# Patient Record
Sex: Female | Born: 1950
Health system: Southern US, Community
[De-identification: ages and names within clinical notes are randomized; demographics above are authoritative.]

## PROBLEM LIST (undated history)

## (undated) DIAGNOSIS — J302 Other seasonal allergic rhinitis: Secondary | ICD-10-CM

## (undated) DIAGNOSIS — R945 Abnormal results of liver function studies: Secondary | ICD-10-CM

## (undated) DIAGNOSIS — H269 Unspecified cataract: Secondary | ICD-10-CM

## (undated) DIAGNOSIS — Z0000 Encounter for general adult medical examination without abnormal findings: Secondary | ICD-10-CM

## (undated) DIAGNOSIS — I219 Acute myocardial infarction, unspecified: Secondary | ICD-10-CM

## (undated) DIAGNOSIS — K449 Diaphragmatic hernia without obstruction or gangrene: Secondary | ICD-10-CM

## (undated) DIAGNOSIS — M722 Plantar fascial fibromatosis: Secondary | ICD-10-CM

## (undated) DIAGNOSIS — N809 Endometriosis, unspecified: Secondary | ICD-10-CM

## (undated) DIAGNOSIS — M1990 Unspecified osteoarthritis, unspecified site: Secondary | ICD-10-CM

## (undated) DIAGNOSIS — Z923 Personal history of irradiation: Secondary | ICD-10-CM

## (undated) DIAGNOSIS — D126 Benign neoplasm of colon, unspecified: Secondary | ICD-10-CM

## (undated) DIAGNOSIS — G4730 Sleep apnea, unspecified: Secondary | ICD-10-CM

## (undated) DIAGNOSIS — C801 Malignant (primary) neoplasm, unspecified: Secondary | ICD-10-CM

## (undated) DIAGNOSIS — J209 Acute bronchitis, unspecified: Secondary | ICD-10-CM

## (undated) DIAGNOSIS — K219 Gastro-esophageal reflux disease without esophagitis: Secondary | ICD-10-CM

## (undated) DIAGNOSIS — I4729 Other ventricular tachycardia: Secondary | ICD-10-CM

## (undated) DIAGNOSIS — J029 Acute pharyngitis, unspecified: Secondary | ICD-10-CM

## (undated) DIAGNOSIS — E785 Hyperlipidemia, unspecified: Secondary | ICD-10-CM

## (undated) DIAGNOSIS — I10 Essential (primary) hypertension: Secondary | ICD-10-CM

## (undated) DIAGNOSIS — K635 Polyp of colon: Secondary | ICD-10-CM

## (undated) DIAGNOSIS — I421 Obstructive hypertrophic cardiomyopathy: Secondary | ICD-10-CM

## (undated) DIAGNOSIS — K222 Esophageal obstruction: Secondary | ICD-10-CM

## (undated) DIAGNOSIS — K760 Fatty (change of) liver, not elsewhere classified: Secondary | ICD-10-CM

## (undated) DIAGNOSIS — T7840XA Allergy, unspecified, initial encounter: Secondary | ICD-10-CM

## (undated) HISTORY — DX: Polyp of colon: K63.5

## (undated) HISTORY — DX: Obstructive hypertrophic cardiomyopathy: I42.1

## (undated) HISTORY — PX: TUBAL LIGATION: SHX77

## (undated) HISTORY — DX: Acute pharyngitis, unspecified: J02.9

## (undated) HISTORY — DX: Plantar fascial fibromatosis: M72.2

## (undated) HISTORY — DX: Esophageal obstruction: K22.2

## (undated) HISTORY — DX: Other ventricular tachycardia: I47.29

## (undated) HISTORY — DX: Benign neoplasm of colon, unspecified: D12.6

## (undated) HISTORY — DX: Sleep apnea, unspecified: G47.30

## (undated) HISTORY — DX: Acute bronchitis, unspecified: J20.9

## (undated) HISTORY — DX: Acute myocardial infarction, unspecified: I21.9

## (undated) HISTORY — PX: BREAST CYST INCISION AND DRAINAGE: SHX14

## (undated) HISTORY — DX: Other seasonal allergic rhinitis: J30.2

## (undated) HISTORY — DX: Unspecified osteoarthritis, unspecified site: M19.90

## (undated) HISTORY — DX: Fatty (change of) liver, not elsewhere classified: K76.0

## (undated) HISTORY — DX: Endometriosis, unspecified: N80.9

## (undated) HISTORY — DX: Essential (primary) hypertension: I10

## (undated) HISTORY — DX: Gastro-esophageal reflux disease without esophagitis: K21.9

## (undated) HISTORY — DX: Diaphragmatic hernia without obstruction or gangrene: K44.9

## (undated) HISTORY — PX: HERNIA REPAIR: SHX51

## (undated) HISTORY — PX: APPENDECTOMY: SHX54

## (undated) HISTORY — PX: CATARACT EXTRACTION: SUR2

## (undated) HISTORY — DX: Encounter for general adult medical examination without abnormal findings: Z00.00

## (undated) HISTORY — DX: Allergy, unspecified, initial encounter: T78.40XA

## (undated) HISTORY — DX: Abnormal results of liver function studies: R94.5

## (undated) HISTORY — DX: Hyperlipidemia, unspecified: E78.5

## (undated) HISTORY — DX: Unspecified cataract: H26.9

---
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
## 1980-06-14 HISTORY — PX: ABDOMINAL HYSTERECTOMY: SHX81

## 1999-07-30 ENCOUNTER — Other Ambulatory Visit: Admission: RE | Admit: 1999-07-30 | Discharge: 1999-07-30 | Payer: Self-pay | Admitting: Obstetrics and Gynecology

## 1999-07-30 ENCOUNTER — Encounter (INDEPENDENT_AMBULATORY_CARE_PROVIDER_SITE_OTHER): Payer: Self-pay

## 2000-07-18 ENCOUNTER — Encounter: Admission: RE | Admit: 2000-07-18 | Discharge: 2000-10-16 | Payer: Self-pay | Admitting: Internal Medicine

## 2000-08-20 ENCOUNTER — Encounter: Payer: Self-pay | Admitting: Emergency Medicine

## 2000-08-20 ENCOUNTER — Emergency Department (HOSPITAL_COMMUNITY): Admission: EM | Admit: 2000-08-20 | Discharge: 2000-08-20 | Payer: Self-pay | Admitting: Emergency Medicine

## 2001-07-05 ENCOUNTER — Other Ambulatory Visit: Admission: RE | Admit: 2001-07-05 | Discharge: 2001-07-05 | Payer: Self-pay | Admitting: Gynecology

## 2002-07-17 ENCOUNTER — Other Ambulatory Visit: Admission: RE | Admit: 2002-07-17 | Discharge: 2002-07-17 | Payer: Self-pay | Admitting: Gynecology

## 2002-08-17 LAB — HM COLONOSCOPY

## 2002-10-18 ENCOUNTER — Ambulatory Visit (HOSPITAL_BASED_OUTPATIENT_CLINIC_OR_DEPARTMENT_OTHER): Admission: RE | Admit: 2002-10-18 | Discharge: 2002-10-18 | Payer: Self-pay | Admitting: Otolaryngology

## 2003-04-14 ENCOUNTER — Emergency Department (HOSPITAL_COMMUNITY): Admission: EM | Admit: 2003-04-14 | Discharge: 2003-04-14 | Payer: Self-pay | Admitting: Emergency Medicine

## 2003-07-29 ENCOUNTER — Encounter: Payer: Self-pay | Admitting: Gastroenterology

## 2003-09-04 ENCOUNTER — Other Ambulatory Visit: Admission: RE | Admit: 2003-09-04 | Discharge: 2003-09-04 | Payer: Self-pay | Admitting: Gynecology

## 2004-02-18 ENCOUNTER — Ambulatory Visit (HOSPITAL_COMMUNITY): Admission: RE | Admit: 2004-02-18 | Discharge: 2004-02-18 | Payer: Self-pay | Admitting: Gastroenterology

## 2004-03-26 ENCOUNTER — Ambulatory Visit (HOSPITAL_COMMUNITY): Admission: RE | Admit: 2004-03-26 | Discharge: 2004-03-26 | Payer: Self-pay | Admitting: Gastroenterology

## 2004-04-06 ENCOUNTER — Ambulatory Visit (HOSPITAL_COMMUNITY): Admission: RE | Admit: 2004-04-06 | Discharge: 2004-04-06 | Payer: Self-pay | Admitting: Gastroenterology

## 2004-04-11 ENCOUNTER — Ambulatory Visit (HOSPITAL_COMMUNITY): Admission: RE | Admit: 2004-04-11 | Discharge: 2004-04-11 | Payer: Self-pay | Admitting: Gastroenterology

## 2004-04-15 ENCOUNTER — Ambulatory Visit (HOSPITAL_COMMUNITY): Admission: RE | Admit: 2004-04-15 | Discharge: 2004-04-15 | Payer: Self-pay | Admitting: Gastroenterology

## 2004-04-23 ENCOUNTER — Ambulatory Visit: Payer: Self-pay | Admitting: Internal Medicine

## 2004-04-30 ENCOUNTER — Ambulatory Visit: Payer: Self-pay | Admitting: Gastroenterology

## 2004-05-04 ENCOUNTER — Ambulatory Visit: Payer: Self-pay | Admitting: Gastroenterology

## 2004-05-04 ENCOUNTER — Ambulatory Visit (HOSPITAL_COMMUNITY): Admission: RE | Admit: 2004-05-04 | Discharge: 2004-05-04 | Payer: Self-pay | Admitting: Gastroenterology

## 2004-05-04 DIAGNOSIS — K449 Diaphragmatic hernia without obstruction or gangrene: Secondary | ICD-10-CM | POA: Insufficient documentation

## 2004-05-04 DIAGNOSIS — K222 Esophageal obstruction: Secondary | ICD-10-CM | POA: Insufficient documentation

## 2004-06-14 HISTORY — PX: OTHER SURGICAL HISTORY: SHX169

## 2004-06-29 ENCOUNTER — Ambulatory Visit: Payer: Self-pay | Admitting: Internal Medicine

## 2004-08-27 ENCOUNTER — Ambulatory Visit: Payer: Self-pay | Admitting: Internal Medicine

## 2004-09-16 ENCOUNTER — Encounter: Admission: RE | Admit: 2004-09-16 | Discharge: 2004-09-16 | Payer: Self-pay | Admitting: Thoracic Surgery

## 2004-10-09 ENCOUNTER — Encounter (INDEPENDENT_AMBULATORY_CARE_PROVIDER_SITE_OTHER): Payer: Self-pay | Admitting: Specialist

## 2004-10-09 ENCOUNTER — Inpatient Hospital Stay (HOSPITAL_COMMUNITY): Admission: RE | Admit: 2004-10-09 | Discharge: 2004-10-13 | Payer: Self-pay | Admitting: Thoracic Surgery

## 2004-10-29 ENCOUNTER — Ambulatory Visit: Payer: Self-pay | Admitting: Endocrinology

## 2004-10-29 ENCOUNTER — Inpatient Hospital Stay (HOSPITAL_COMMUNITY): Admission: EM | Admit: 2004-10-29 | Discharge: 2004-11-01 | Payer: Self-pay | Admitting: Emergency Medicine

## 2004-11-03 ENCOUNTER — Ambulatory Visit: Payer: Self-pay | Admitting: Internal Medicine

## 2004-11-04 ENCOUNTER — Encounter: Admission: RE | Admit: 2004-11-04 | Discharge: 2004-11-04 | Payer: Self-pay | Admitting: Thoracic Surgery

## 2004-11-26 ENCOUNTER — Ambulatory Visit: Payer: Self-pay | Admitting: Internal Medicine

## 2004-11-30 ENCOUNTER — Encounter: Admission: RE | Admit: 2004-11-30 | Discharge: 2004-11-30 | Payer: Self-pay | Admitting: Thoracic Surgery

## 2005-01-01 ENCOUNTER — Ambulatory Visit: Payer: Self-pay | Admitting: Internal Medicine

## 2005-01-14 ENCOUNTER — Ambulatory Visit: Payer: Self-pay | Admitting: Internal Medicine

## 2005-02-03 ENCOUNTER — Encounter: Admission: RE | Admit: 2005-02-03 | Discharge: 2005-02-03 | Payer: Self-pay | Admitting: Thoracic Surgery

## 2005-02-17 ENCOUNTER — Other Ambulatory Visit: Admission: RE | Admit: 2005-02-17 | Discharge: 2005-02-17 | Payer: Self-pay | Admitting: Gynecology

## 2005-03-17 ENCOUNTER — Ambulatory Visit: Payer: Self-pay | Admitting: Internal Medicine

## 2005-06-15 ENCOUNTER — Ambulatory Visit: Payer: Self-pay | Admitting: Internal Medicine

## 2005-06-22 ENCOUNTER — Ambulatory Visit: Payer: Self-pay | Admitting: Internal Medicine

## 2005-07-01 ENCOUNTER — Encounter: Payer: Self-pay | Admitting: Cardiology

## 2005-07-01 ENCOUNTER — Ambulatory Visit: Payer: Self-pay

## 2005-07-05 ENCOUNTER — Ambulatory Visit: Payer: Self-pay | Admitting: Internal Medicine

## 2005-07-08 ENCOUNTER — Ambulatory Visit: Payer: Self-pay | Admitting: Internal Medicine

## 2005-08-03 ENCOUNTER — Ambulatory Visit: Payer: Self-pay | Admitting: Internal Medicine

## 2005-09-07 ENCOUNTER — Ambulatory Visit: Payer: Self-pay | Admitting: Internal Medicine

## 2005-11-03 ENCOUNTER — Ambulatory Visit: Payer: Self-pay | Admitting: Internal Medicine

## 2005-11-17 ENCOUNTER — Ambulatory Visit: Payer: Self-pay | Admitting: Internal Medicine

## 2005-12-17 ENCOUNTER — Ambulatory Visit: Payer: Self-pay | Admitting: Internal Medicine

## 2005-12-29 ENCOUNTER — Ambulatory Visit: Payer: Self-pay | Admitting: Internal Medicine

## 2006-03-28 ENCOUNTER — Ambulatory Visit: Payer: Self-pay | Admitting: Internal Medicine

## 2006-03-28 LAB — CONVERTED CEMR LAB
AST: 22 units/L (ref 0–37)
BUN: 11 mg/dL (ref 6–23)
Chloride: 108 meq/L (ref 96–112)
Creatinine, Ser: 1 mg/dL (ref 0.4–1.2)
GFR calc non Af Amer: 62 mL/min
Potassium: 4.2 meq/L (ref 3.5–5.1)
Total Bilirubin: 0.5 mg/dL (ref 0.3–1.2)

## 2006-04-04 ENCOUNTER — Ambulatory Visit: Payer: Self-pay | Admitting: Internal Medicine

## 2006-07-04 ENCOUNTER — Ambulatory Visit: Payer: Self-pay | Admitting: Internal Medicine

## 2006-07-04 LAB — CONVERTED CEMR LAB
BUN: 7 mg/dL (ref 6–23)
Basophils Relative: 0.5 % (ref 0.0–1.0)
Calcium: 9.3 mg/dL (ref 8.4–10.5)
Cholesterol: 109 mg/dL (ref 0–200)
Creatinine, Ser: 1 mg/dL (ref 0.4–1.2)
GFR calc Af Amer: 75 mL/min
GFR calc non Af Amer: 62 mL/min
HDL: 35.3 mg/dL — ABNORMAL LOW (ref >39.0)
Hemoglobin: 12.9 g/dL (ref 12.0–15.0)
Hgb A1c MFr Bld: 7.3 % — ABNORMAL HIGH (ref 4.6–6.0)
LDL Cholesterol: 58 mg/dL (ref 0–99)
MCHC: 35.3 g/dL (ref 30.0–36.0)
Monocytes Absolute: 0.4 10*3/uL (ref 0.2–0.7)
Monocytes Relative: 7.9 % (ref 3.0–11.0)
Neutro Abs: 2.5 10*3/uL (ref 1.4–7.7)
Neutrophils Relative %: 53 % (ref 43.0–77.0)
Platelets: 229 10*3/uL (ref 150–400)
Potassium: 3.9 meq/L (ref 3.5–5.1)
RDW: 12.5 % (ref 11.5–14.6)
Sodium: 141 meq/L (ref 135–145)
Total CHOL/HDL Ratio: 3.1
Triglycerides: 78 mg/dL (ref 0–149)
VLDL: 16 mg/dL (ref 0–40)

## 2006-07-11 ENCOUNTER — Ambulatory Visit: Payer: Self-pay | Admitting: Internal Medicine

## 2006-07-22 ENCOUNTER — Ambulatory Visit: Payer: Self-pay | Admitting: Internal Medicine

## 2006-09-12 ENCOUNTER — Ambulatory Visit: Payer: Self-pay | Admitting: Internal Medicine

## 2006-10-28 DIAGNOSIS — I10 Essential (primary) hypertension: Secondary | ICD-10-CM | POA: Insufficient documentation

## 2006-10-28 DIAGNOSIS — E782 Mixed hyperlipidemia: Secondary | ICD-10-CM | POA: Insufficient documentation

## 2006-10-28 DIAGNOSIS — F418 Other specified anxiety disorders: Secondary | ICD-10-CM | POA: Insufficient documentation

## 2006-10-28 DIAGNOSIS — E1165 Type 2 diabetes mellitus with hyperglycemia: Secondary | ICD-10-CM | POA: Insufficient documentation

## 2006-11-01 ENCOUNTER — Ambulatory Visit: Payer: Self-pay | Admitting: Internal Medicine

## 2006-11-08 ENCOUNTER — Ambulatory Visit: Payer: Self-pay | Admitting: Internal Medicine

## 2006-11-16 ENCOUNTER — Ambulatory Visit: Payer: Self-pay | Admitting: Internal Medicine

## 2006-11-30 ENCOUNTER — Ambulatory Visit: Payer: Self-pay | Admitting: Internal Medicine

## 2006-12-22 ENCOUNTER — Ambulatory Visit: Payer: Self-pay | Admitting: Internal Medicine

## 2007-03-22 ENCOUNTER — Ambulatory Visit: Payer: Self-pay | Admitting: Internal Medicine

## 2007-03-22 DIAGNOSIS — I716 Thoracoabdominal aortic aneurysm, without rupture: Secondary | ICD-10-CM

## 2007-03-22 LAB — CONVERTED CEMR LAB
BUN: 8 mg/dL (ref 6–23)
Chloride: 107 meq/L (ref 96–112)
Cholesterol: 140 mg/dL (ref 0–200)
GFR calc non Af Amer: 79 mL/min
Glucose, Bld: 204 mg/dL — ABNORMAL HIGH (ref 70–99)
Hgb A1c MFr Bld: 9.3 % — ABNORMAL HIGH (ref 4.6–6.0)
Potassium: 4.3 meq/L (ref 3.5–5.1)
Sodium: 143 meq/L (ref 135–145)
Total CHOL/HDL Ratio: 4.1
Triglycerides: 94 mg/dL (ref 0–149)

## 2007-03-24 ENCOUNTER — Ambulatory Visit: Payer: Self-pay | Admitting: Cardiology

## 2007-04-04 ENCOUNTER — Telehealth: Payer: Self-pay | Admitting: Internal Medicine

## 2007-04-13 ENCOUNTER — Ambulatory Visit: Payer: Self-pay | Admitting: Thoracic Surgery

## 2007-04-25 ENCOUNTER — Ambulatory Visit: Payer: Self-pay | Admitting: Gastroenterology

## 2007-05-06 ENCOUNTER — Ambulatory Visit: Payer: Self-pay | Admitting: Internal Medicine

## 2007-05-06 ENCOUNTER — Telehealth: Payer: Self-pay | Admitting: Internal Medicine

## 2007-05-25 ENCOUNTER — Encounter: Payer: Self-pay | Admitting: Internal Medicine

## 2007-05-25 ENCOUNTER — Ambulatory Visit: Payer: Self-pay | Admitting: Gastroenterology

## 2007-06-21 ENCOUNTER — Ambulatory Visit: Payer: Self-pay | Admitting: Internal Medicine

## 2007-06-21 DIAGNOSIS — M722 Plantar fascial fibromatosis: Secondary | ICD-10-CM | POA: Insufficient documentation

## 2007-06-21 LAB — CONVERTED CEMR LAB
ALT: 69 units/L — ABNORMAL HIGH (ref 0–35)
AST: 51 units/L — ABNORMAL HIGH (ref 0–37)
Bilirubin, Direct: 0.1 mg/dL (ref 0.0–0.3)
Calcium: 10 mg/dL (ref 8.4–10.5)
Chloride: 102 meq/L (ref 96–112)
GFR calc non Af Amer: 69 mL/min
Hgb A1c MFr Bld: 8.5 % — ABNORMAL HIGH (ref 4.6–6.0)
Sodium: 140 meq/L (ref 135–145)
Total Protein: 7 g/dL (ref 6.0–8.3)

## 2007-07-24 ENCOUNTER — Encounter: Payer: Self-pay | Admitting: Internal Medicine

## 2007-09-15 ENCOUNTER — Ambulatory Visit: Payer: Self-pay | Admitting: Internal Medicine

## 2007-09-20 ENCOUNTER — Ambulatory Visit: Payer: Self-pay | Admitting: Internal Medicine

## 2007-09-20 LAB — CONVERTED CEMR LAB
ALT: 47 units/L — ABNORMAL HIGH (ref 0–35)
AST: 32 units/L (ref 0–37)
Bilirubin, Direct: 0.1 mg/dL (ref 0.0–0.3)
Hgb A1c MFr Bld: 7.3 % — ABNORMAL HIGH (ref 4.6–6.0)
Total Protein: 6.9 g/dL (ref 6.0–8.3)

## 2007-09-25 ENCOUNTER — Telehealth: Payer: Self-pay | Admitting: Internal Medicine

## 2007-09-26 ENCOUNTER — Ambulatory Visit: Payer: Self-pay | Admitting: Internal Medicine

## 2007-09-26 LAB — CONVERTED CEMR LAB
ALT: 46 units/L — ABNORMAL HIGH (ref 0–35)
Basophils Relative: 0.5 % (ref 0.0–1.0)
EBV VCA IgG: 5.22 — ABNORMAL HIGH
EBV VCA IgM: 0.37
Eosinophils Relative: 1.2 % (ref 0.0–5.0)
Hemoglobin: 13.9 g/dL (ref 12.0–15.0)
Lymphocytes Relative: 35.8 % (ref 12.0–46.0)
MCHC: 33.2 g/dL (ref 30.0–36.0)
Monocytes Absolute: 0.5 10*3/uL (ref 0.1–1.0)
Neutro Abs: 4.2 10*3/uL (ref 1.4–7.7)
Neutrophils Relative %: 55.9 % (ref 43.0–77.0)
RBC: 5.07 M/uL (ref 3.87–5.11)
RDW: 13.1 % (ref 11.5–14.6)
Total Bilirubin: 0.4 mg/dL (ref 0.3–1.2)
Total Protein: 7.1 g/dL (ref 6.0–8.3)

## 2007-10-10 ENCOUNTER — Ambulatory Visit: Payer: Self-pay | Admitting: Internal Medicine

## 2007-10-10 LAB — CONVERTED CEMR LAB
ALT: 47 units/L — ABNORMAL HIGH (ref 0–35)
AST: 29 units/L (ref 0–37)
Alkaline Phosphatase: 66 units/L (ref 39–117)
Total Bilirubin: 0.5 mg/dL (ref 0.3–1.2)

## 2007-10-17 ENCOUNTER — Telehealth: Payer: Self-pay | Admitting: *Deleted

## 2008-01-03 ENCOUNTER — Ambulatory Visit: Payer: Self-pay | Admitting: Internal Medicine

## 2008-01-03 DIAGNOSIS — B351 Tinea unguium: Secondary | ICD-10-CM | POA: Insufficient documentation

## 2008-01-03 LAB — CONVERTED CEMR LAB
ALT: 37 units/L — ABNORMAL HIGH (ref 0–35)
AST: 29 units/L (ref 0–37)
Bilirubin, Direct: 0.1 mg/dL (ref 0.0–0.3)
Total Protein: 7.3 g/dL (ref 6.0–8.3)

## 2008-01-09 ENCOUNTER — Telehealth: Payer: Self-pay | Admitting: Internal Medicine

## 2008-03-15 ENCOUNTER — Telehealth: Payer: Self-pay | Admitting: Internal Medicine

## 2008-03-19 ENCOUNTER — Ambulatory Visit: Payer: Self-pay | Admitting: Internal Medicine

## 2008-03-27 ENCOUNTER — Ambulatory Visit: Payer: Self-pay | Admitting: Thoracic Surgery

## 2008-03-27 ENCOUNTER — Telehealth: Payer: Self-pay | Admitting: Internal Medicine

## 2008-03-27 ENCOUNTER — Encounter: Admission: RE | Admit: 2008-03-27 | Discharge: 2008-03-27 | Payer: Self-pay | Admitting: Thoracic Surgery

## 2008-04-04 ENCOUNTER — Ambulatory Visit: Payer: Self-pay | Admitting: Internal Medicine

## 2008-04-04 LAB — CONVERTED CEMR LAB
ALT: 37 units/L — ABNORMAL HIGH (ref 0–35)
Albumin: 3.6 g/dL (ref 3.5–5.2)
Alkaline Phosphatase: 80 units/L (ref 39–117)
BUN: 15 mg/dL (ref 6–23)
Calcium: 9.6 mg/dL (ref 8.4–10.5)
Chloride: 104 meq/L (ref 96–112)
Creatinine, Ser: 0.9 mg/dL (ref 0.4–1.2)
GFR calc non Af Amer: 69 mL/min
Glucose, Bld: 118 mg/dL — ABNORMAL HIGH (ref 70–99)

## 2008-05-13 ENCOUNTER — Other Ambulatory Visit: Admission: RE | Admit: 2008-05-13 | Discharge: 2008-05-13 | Payer: Self-pay | Admitting: Gynecology

## 2008-05-21 ENCOUNTER — Telehealth: Payer: Self-pay | Admitting: Internal Medicine

## 2008-06-28 ENCOUNTER — Ambulatory Visit: Payer: Self-pay | Admitting: Internal Medicine

## 2008-06-28 LAB — CONVERTED CEMR LAB
Albumin: 3.8 g/dL (ref 3.5–5.2)
BUN: 15 mg/dL (ref 6–23)
Creatinine, Ser: 0.8 mg/dL (ref 0.4–1.2)
GFR calc Af Amer: 96 mL/min
GFR calc non Af Amer: 79 mL/min
Glucose, Bld: 106 mg/dL — ABNORMAL HIGH (ref 70–99)
HDL: 35.5 mg/dL — ABNORMAL LOW (ref >39.0)
LDL Cholesterol: 72 mg/dL (ref 0–99)
Potassium: 4 meq/L (ref 3.5–5.1)
Total CHOL/HDL Ratio: 3.8
Total Protein: 7.1 g/dL (ref 6.0–8.3)
VLDL: 27 mg/dL (ref 0–40)

## 2008-07-05 ENCOUNTER — Ambulatory Visit: Payer: Self-pay | Admitting: Internal Medicine

## 2008-07-05 DIAGNOSIS — M1990 Unspecified osteoarthritis, unspecified site: Secondary | ICD-10-CM | POA: Insufficient documentation

## 2008-07-05 DIAGNOSIS — R109 Unspecified abdominal pain: Secondary | ICD-10-CM | POA: Insufficient documentation

## 2008-07-24 ENCOUNTER — Encounter: Payer: Self-pay | Admitting: Internal Medicine

## 2008-08-12 LAB — CONVERTED CEMR LAB: Pap Smear: NORMAL

## 2008-08-13 ENCOUNTER — Telehealth: Payer: Self-pay | Admitting: Internal Medicine

## 2008-09-18 ENCOUNTER — Telehealth: Payer: Self-pay | Admitting: Internal Medicine

## 2008-10-03 ENCOUNTER — Ambulatory Visit: Payer: Self-pay | Admitting: Internal Medicine

## 2008-10-03 LAB — CONVERTED CEMR LAB
Hep B S Ab: NEGATIVE
Hgb A1c MFr Bld: 6.9 % — ABNORMAL HIGH (ref 4.6–6.5)

## 2008-10-10 ENCOUNTER — Ambulatory Visit: Payer: Self-pay | Admitting: Internal Medicine

## 2008-10-10 DIAGNOSIS — M546 Pain in thoracic spine: Secondary | ICD-10-CM | POA: Insufficient documentation

## 2008-10-11 ENCOUNTER — Encounter: Admission: RE | Admit: 2008-10-11 | Discharge: 2008-10-11 | Payer: Self-pay | Admitting: Internal Medicine

## 2008-10-14 ENCOUNTER — Telehealth: Payer: Self-pay | Admitting: Internal Medicine

## 2008-10-21 ENCOUNTER — Encounter: Admission: RE | Admit: 2008-10-21 | Discharge: 2008-10-21 | Payer: Self-pay | Admitting: Internal Medicine

## 2008-10-21 ENCOUNTER — Encounter: Payer: Self-pay | Admitting: Internal Medicine

## 2008-10-22 ENCOUNTER — Encounter: Payer: Self-pay | Admitting: Internal Medicine

## 2008-12-18 ENCOUNTER — Encounter: Admission: RE | Admit: 2008-12-18 | Discharge: 2009-03-18 | Payer: Self-pay | Admitting: Internal Medicine

## 2008-12-18 ENCOUNTER — Encounter: Payer: Self-pay | Admitting: Internal Medicine

## 2008-12-20 ENCOUNTER — Telehealth: Payer: Self-pay | Admitting: *Deleted

## 2008-12-20 ENCOUNTER — Ambulatory Visit: Payer: Self-pay | Admitting: Internal Medicine

## 2008-12-20 DIAGNOSIS — I452 Bifascicular block: Secondary | ICD-10-CM | POA: Insufficient documentation

## 2008-12-31 ENCOUNTER — Ambulatory Visit: Payer: Self-pay | Admitting: Internal Medicine

## 2008-12-31 LAB — CONVERTED CEMR LAB
Alkaline Phosphatase: 75 units/L (ref 39–117)
Bilirubin, Direct: 0.1 mg/dL (ref 0.0–0.3)
Cholesterol: 150 mg/dL (ref 0–200)
Creatinine,U: 176.9 mg/dL
LDL Cholesterol: 89 mg/dL (ref 0–99)
Microalb Creat Ratio: 2.3 mg/g (ref 0.0–30.0)
Microalb, Ur: 0.4 mg/dL (ref 0.0–1.9)
Total Bilirubin: 0.7 mg/dL (ref 0.3–1.2)
Total CHOL/HDL Ratio: 4
VLDL: 20.8 mg/dL (ref 0.0–40.0)

## 2009-01-01 ENCOUNTER — Ambulatory Visit: Payer: Self-pay

## 2009-01-01 ENCOUNTER — Encounter: Payer: Self-pay | Admitting: Cardiology

## 2009-01-07 ENCOUNTER — Ambulatory Visit: Payer: Self-pay | Admitting: Internal Medicine

## 2009-01-16 ENCOUNTER — Encounter: Payer: Self-pay | Admitting: Internal Medicine

## 2009-01-20 ENCOUNTER — Telehealth (INDEPENDENT_AMBULATORY_CARE_PROVIDER_SITE_OTHER): Payer: Self-pay | Admitting: *Deleted

## 2009-01-30 ENCOUNTER — Ambulatory Visit: Payer: Self-pay | Admitting: Internal Medicine

## 2009-01-30 DIAGNOSIS — R0989 Other specified symptoms and signs involving the circulatory and respiratory systems: Secondary | ICD-10-CM

## 2009-01-30 DIAGNOSIS — R0609 Other forms of dyspnea: Secondary | ICD-10-CM | POA: Insufficient documentation

## 2009-02-04 ENCOUNTER — Ambulatory Visit: Payer: Self-pay

## 2009-02-04 ENCOUNTER — Encounter: Payer: Self-pay | Admitting: Internal Medicine

## 2009-02-19 ENCOUNTER — Encounter: Payer: Self-pay | Admitting: Internal Medicine

## 2009-02-19 ENCOUNTER — Ambulatory Visit (HOSPITAL_BASED_OUTPATIENT_CLINIC_OR_DEPARTMENT_OTHER): Admission: RE | Admit: 2009-02-19 | Discharge: 2009-02-19 | Payer: Self-pay | Admitting: Internal Medicine

## 2009-02-21 ENCOUNTER — Ambulatory Visit: Payer: Self-pay | Admitting: Internal Medicine

## 2009-03-17 ENCOUNTER — Telehealth: Payer: Self-pay | Admitting: Internal Medicine

## 2009-03-19 ENCOUNTER — Ambulatory Visit: Payer: Self-pay | Admitting: Internal Medicine

## 2009-03-19 LAB — CONVERTED CEMR LAB: Hgb A1c MFr Bld: 7.2 % — ABNORMAL HIGH (ref 4.6–6.5)

## 2009-03-26 ENCOUNTER — Ambulatory Visit: Payer: Self-pay | Admitting: Internal Medicine

## 2009-03-26 DIAGNOSIS — G4733 Obstructive sleep apnea (adult) (pediatric): Secondary | ICD-10-CM | POA: Insufficient documentation

## 2009-03-28 ENCOUNTER — Telehealth: Payer: Self-pay | Admitting: Internal Medicine

## 2009-04-07 ENCOUNTER — Encounter: Payer: Self-pay | Admitting: Internal Medicine

## 2009-04-15 ENCOUNTER — Encounter (INDEPENDENT_AMBULATORY_CARE_PROVIDER_SITE_OTHER): Payer: Self-pay | Admitting: *Deleted

## 2009-04-29 ENCOUNTER — Ambulatory Visit: Payer: Self-pay | Admitting: Internal Medicine

## 2009-04-29 LAB — CONVERTED CEMR LAB
BUN: 13 mg/dL (ref 6–23)
Bilirubin, Direct: 0 mg/dL (ref 0.0–0.3)
Chloride: 101 meq/L (ref 96–112)
GFR calc non Af Amer: 83.1 mL/min (ref >60)
Glucose, Bld: 206 mg/dL — ABNORMAL HIGH (ref 70–99)
Potassium: 4.2 meq/L (ref 3.5–5.1)
Sodium: 141 meq/L (ref 135–145)
Total Bilirubin: 0.7 mg/dL (ref 0.3–1.2)

## 2009-05-14 LAB — HM DIABETES EYE EXAM

## 2009-08-22 ENCOUNTER — Ambulatory Visit: Payer: Self-pay | Admitting: Internal Medicine

## 2009-08-22 LAB — CONVERTED CEMR LAB
ALT: 44 U/L — ABNORMAL HIGH
AST: 28 U/L
Albumin: 3.6 g/dL
Alkaline Phosphatase: 75 U/L
BUN: 11 mg/dL
Basophils Absolute: 0 K/uL
Basophils Relative: 0.1 %
Bilirubin Urine: NEGATIVE
Bilirubin, Direct: 0.1 mg/dL
Blood in Urine, dipstick: NEGATIVE
CO2: 29 meq/L
Calcium: 9.3 mg/dL
Chloride: 107 meq/L
Cholesterol: 130 mg/dL
Creatinine, Ser: 0.9 mg/dL
Creatinine,U: 182.9 mg/dL
Eosinophils Absolute: 0.1 K/uL
Eosinophils Relative: 0.9 %
GFR calc non Af Amer: 83.01 mL/min
Glucose, Bld: 139 mg/dL — ABNORMAL HIGH
HCT: 40.3 %
HDL: 45.2 mg/dL
Hemoglobin: 13.2 g/dL
Hgb A1c MFr Bld: 7.2 % — ABNORMAL HIGH
Ketones, urine, test strip: NEGATIVE
LDL Cholesterol: 67 mg/dL
Lymphocytes Relative: 30.8 %
Lymphs Abs: 2 K/uL
MCHC: 32.7 g/dL
MCV: 82.8 fL
Microalb Creat Ratio: 2.7 mg/g
Microalb, Ur: 0.5 mg/dL
Monocytes Absolute: 0.6 K/uL
Monocytes Relative: 8.9 %
Neutro Abs: 3.7 K/uL
Neutrophils Relative %: 59.3 %
Nitrite: NEGATIVE
Platelets: 230 K/uL
Potassium: 4.1 meq/L
RBC: 4.87 M/uL
RDW: 13 %
Sodium: 142 meq/L
Specific Gravity, Urine: >=1.03
TSH: 1.27 u[IU]/mL
Total Bilirubin: 0.6 mg/dL
Total CHOL/HDL Ratio: 3
Total Protein: 7.2 g/dL
Triglycerides: 88 mg/dL
Urobilinogen, UA: 0.2
VLDL: 17.6 mg/dL
WBC: 6.4 10*3/microliter

## 2009-08-25 ENCOUNTER — Encounter: Payer: Self-pay | Admitting: Internal Medicine

## 2009-08-25 LAB — HM MAMMOGRAPHY

## 2009-08-29 ENCOUNTER — Ambulatory Visit: Payer: Self-pay | Admitting: Internal Medicine

## 2009-10-21 ENCOUNTER — Encounter: Payer: Self-pay | Admitting: Internal Medicine

## 2009-10-31 ENCOUNTER — Ambulatory Visit: Payer: Self-pay | Admitting: Internal Medicine

## 2009-10-31 ENCOUNTER — Telehealth: Payer: Self-pay | Admitting: Internal Medicine

## 2009-10-31 DIAGNOSIS — M545 Low back pain, unspecified: Secondary | ICD-10-CM | POA: Insufficient documentation

## 2009-11-01 ENCOUNTER — Ambulatory Visit: Payer: Self-pay | Admitting: Family Medicine

## 2009-12-01 ENCOUNTER — Ambulatory Visit: Payer: Self-pay | Admitting: Internal Medicine

## 2009-12-08 ENCOUNTER — Ambulatory Visit: Payer: Self-pay | Admitting: Internal Medicine

## 2010-01-19 ENCOUNTER — Ambulatory Visit: Payer: Self-pay | Admitting: Internal Medicine

## 2010-01-19 LAB — CONVERTED CEMR LAB
BUN: 13 mg/dL (ref 6–23)
Calcium: 9.5 mg/dL (ref 8.4–10.5)
Chloride: 106 meq/L (ref 96–112)
Creatinine, Ser: 0.8 mg/dL (ref 0.4–1.2)
Hgb A1c MFr Bld: 8.2 % — ABNORMAL HIGH (ref 4.6–6.5)

## 2010-01-26 ENCOUNTER — Ambulatory Visit: Payer: Self-pay | Admitting: Internal Medicine

## 2010-04-21 ENCOUNTER — Ambulatory Visit: Payer: Self-pay | Admitting: Internal Medicine

## 2010-04-21 LAB — CONVERTED CEMR LAB
AST: 48 units/L — ABNORMAL HIGH (ref 0–37)
Albumin: 3.6 g/dL (ref 3.5–5.2)
Hgb A1c MFr Bld: 7.7 % — ABNORMAL HIGH (ref 4.6–6.5)

## 2010-04-28 ENCOUNTER — Ambulatory Visit: Payer: Self-pay | Admitting: Internal Medicine

## 2010-04-28 DIAGNOSIS — K760 Fatty (change of) liver, not elsewhere classified: Secondary | ICD-10-CM | POA: Insufficient documentation

## 2010-04-28 HISTORY — DX: Fatty (change of) liver, not elsewhere classified: K76.0

## 2010-05-22 ENCOUNTER — Telehealth: Payer: Self-pay | Admitting: Internal Medicine

## 2010-05-29 ENCOUNTER — Ambulatory Visit: Payer: Self-pay | Admitting: Internal Medicine

## 2010-05-29 LAB — CONVERTED CEMR LAB
ALT: 88 units/L — ABNORMAL HIGH (ref 0–35)
Alkaline Phosphatase: 80 units/L (ref 39–117)
BUN: 15 mg/dL (ref 6–23)
Bilirubin, Direct: 0.1 mg/dL (ref 0.0–0.3)
Calcium: 9.7 mg/dL (ref 8.4–10.5)
Creatinine, Ser: 0.9 mg/dL (ref 0.4–1.2)
GFR calc non Af Amer: 83.86 mL/min (ref >60.00)
Glucose, Bld: 71 mg/dL (ref 70–99)
Total Protein: 7.1 g/dL (ref 6.0–8.3)

## 2010-06-16 ENCOUNTER — Encounter: Payer: Self-pay | Admitting: Internal Medicine

## 2010-07-05 ENCOUNTER — Encounter: Payer: Self-pay | Admitting: Thoracic Surgery

## 2010-07-08 ENCOUNTER — Encounter: Payer: Self-pay | Admitting: Gastroenterology

## 2010-07-12 LAB — CONVERTED CEMR LAB
Free T4: 0.7 ng/dL (ref 0.6–1.6)
GFR calc non Af Amer: 95.32 mL/min (ref >60)
Glucose, Bld: 160 mg/dL — ABNORMAL HIGH (ref 70–99)
Potassium: 4.1 meq/L (ref 3.5–5.1)
Sodium: 142 meq/L (ref 135–145)
T3, Free: 2.7 pg/mL (ref 2.3–4.2)
TSH: 0.87 microintl units/mL (ref 0.35–5.50)
Total CK: 97 units/L (ref 7–177)

## 2010-07-14 NOTE — Assessment & Plan Note (Signed)
Summary: LBP   Vital Signs:  Patient profile:   60 year old female Height:      62.5 inches Weight:      214 pounds BMI:     38.66 O2 Sat:      98 % on Room air Pulse rate:   92 / minute BP sitting:   136 / 92  (left arm) Cuff size:   large  Vitals Entered By: Payton Spark CMA (Nov 01, 2009 9:59 AM)  O2 Flow:  Room air CC: low back pain x 3 days. Feels very tight. , Back Pain   Primary Care Provider:  Stacie Glaze MD  CC:  low back pain x 3 days. Feels very tight.  and Back Pain.  History of Present Illness:       This is a 60 year old woman who presents with Back Pain.  The symptoms began 3 days ago.  The intensity is described as a 10 out of 10.  The patient denies fever, chills, weakness, loss of sensation, urinary incontinence, and urinary retention.  The pain is located in the right low back and left low back.  The pain began gradually.  The pain is made worse by lying down, flexion, and activity.  The pain is made better by inactivity and acetaminophen.  Risk factors for serious underlying conditions include age >= 50 years.    Current Medications (verified): 1)  Janumet 50-500 Mg Tabs (Sitagliptin-Metformin Hcl) .... Take 1 Tablet By Mouth Twice A Day 2)  Lipitor 10 Mg Tabs (Atorvastatin Calcium) .... Once Daily 3)  Omeprazole 40 Mg  Cpdr (Omeprazole) .Marland Kitchen.. 1 By Mouth Daily 4)  Freestyle Lite   Strp (Glucose Blood) .... Use Two Times A Day As Directed For Cbg's  ( 250.82 5)  Micardis Hct 40-12.5 Mg Tabs (Telmisartan-Hctz) .... Take 1 Tablet By Mouth Once A Day  Allergies (verified): 1)  ! Codeine 2)  * Amoxil  Past History:  Past Medical History: Reviewed history from 01/30/2009 and no changes required. HIATAL HERNIA (ICD-553.3) ESOPHAGEAL STRICTURE (ICD-530.3) LIVER FUNCTION TESTS, ABNORMAL (ICD-794.8)- Presumed fatty liver disease FASCIITIS, PLANTAR (ICD-728.71) OTALGIA (ICD-388.70) GERD (ICD-530.81) SLEEP APNEA (ICD-780.57) DEPRESSION  (ICD-311) HYPERLIPIDEMIA (ICD-272.4) DIABETES MELLITUS, TYPE II (ICD-250.00) HYPERTENSION (ICD-401.9)  Social History: Reviewed history from 01/30/2009 and no changes required. Married.  Lives in [place] with spouse.  Unemployed.  Previous Engineer, mining.  Never Smoked Alcohol use-no Drug use-no  Review of Systems      See HPI  Physical Exam  General:  obese AAF in mild distress Neck:  supple and full ROM.   Lungs:  Normal respiratory effort, chest expands symmetrically. Lungs are clear to auscultation, no crackles or wheezes. Heart:  Normal rate and regular rhythm. S1 and S2 normal without gallop, murmur, click, rub or other extra sounds. Msk:  tender L>R from T10-L4 with prominent muscle spasm in the lats and rhomboids.  Nontender over sciatic notch.  + L sided seated straight leg raise with antalgic gait.  Painful limited L spine flexion/ ex. Extremities:  no LE edema Neurologic:  sensation intact to light touch and DTRs symmetrical and normal.     Impression & Recommendations:  Problem # 1:  LOW BACK PAIN, ACUTE (ICD-724.2) Reviewed Xrays showing only mild spondylosis from yesterday. Exam c/w MSK LBP w/o red flags.  Will start her on RX NSAID + nighttime muscle relaxer with as needed use of pain medication.  Use heating pad and gentle stretching.  Avoid heavy lifting  and f/u with PCP if not seeing improvements in 10 days.  May need to do some PT.  Long term wt loss and regular exercise will be important. Her updated medication list for this problem includes:    Naprosyn 500 Mg Tabs (Naproxen) .Marland Kitchen... 1 tab by mouth two times a day with meals    Flexeril 10 Mg Tabs (Cyclobenzaprine hcl) .Marland Kitchen... 1 tab by mouth at bedtime as needed back pain    Hydrocodone-acetaminophen 5-500 Mg Tabs (Hydrocodone-acetaminophen) .Marland Kitchen... 1 tab by mouth three times a day as needed severe back pain  Complete Medication List: 1)  Janumet 50-500 Mg Tabs (Sitagliptin-metformin hcl) .... Take 1 tablet by  mouth twice a day 2)  Lipitor 10 Mg Tabs (Atorvastatin calcium) .... Once daily 3)  Omeprazole 40 Mg Cpdr (Omeprazole) .Marland Kitchen.. 1 by mouth daily 4)  Freestyle Lite Strp (Glucose blood) .... Use two times a day as directed for cbg's  ( 250.82 5)  Micardis Hct 40-12.5 Mg Tabs (Telmisartan-hctz) .... Take 1 tablet by mouth once a day 6)  Naprosyn 500 Mg Tabs (Naproxen) .Marland Kitchen.. 1 tab by mouth two times a day with meals 7)  Flexeril 10 Mg Tabs (Cyclobenzaprine hcl) .Marland Kitchen.. 1 tab by mouth at bedtime as needed back pain 8)  Hydrocodone-acetaminophen 5-500 Mg Tabs (Hydrocodone-acetaminophen) .Marland Kitchen.. 1 tab by mouth three times a day as needed severe back pain  Patient Instructions: 1)  For acute back pain: 2)  Take Flexeril at night (muscle relaxer). 3)  Use Hydrocodone for severe pain.  Take with food.  Watch for constipating side effects. 4)  Take naprosyn with breakfast and dinner x 2 wks (anti - inflammatory). 5)  Do not take anything else OTC like Advil, ibuprofen or Tylenol. 6)  Avoid heavy lifting. 7)  call Dr Lovell Sheehan if not improving in 10 days. Prescriptions: HYDROCODONE-ACETAMINOPHEN 5-500 MG TABS (HYDROCODONE-ACETAMINOPHEN) 1 tab by mouth three times a day as needed severe back pain  #24 x 0   Entered and Authorized by:   Seymour Bars DO   Signed by:   Seymour Bars DO on 11/01/2009   Method used:   Printed then faxed to ...       [facility]  [address] [phone]* (retail)       [address]       [place], [place]  [address]       Ph: [phone] or [phone]       Fax: [phone]   RxID:   [id number] FLEXERIL 10 MG TABS (CYCLOBENZAPRINE HCL) 1 tab by mouth at bedtime as needed back pain  #20 x 0   Entered and Authorized by:   Seymour Bars DO   Signed by:   Seymour Bars DO on 11/01/2009   Method used:   Electronically to        [facility]  [address] [phone]* (retail)       [address] [place], [place]  [address]       Ph: [phone] or [phone]       Fax: [phone]   RxID:    [id number] NAPROSYN 500 MG TABS (NAPROXEN) 1 tab by mouth two times a day with meals  #28 x 0   Entered and Authorized by:   Seymour Bars DO   Signed by:   Seymour Bars DO on 11/01/2009   Method used:   Electronically to  [facility]  [address] [phone]* (retail)       [address]       [place], [place]  [address]       Ph: [phone] or [phone]       Fax: [phone]   RxID:   [id number]

## 2010-07-14 NOTE — Medication Information (Signed)
Summary: Order for CPAP Supplies/[facility]  Order for CPAP Supplies/[facility]   Imported By: Maryln Gottron 12/11/2009 12:49:46  _____________________________________________________________________  External Attachment:    Type:   Image     Comment:   External Document

## 2010-07-14 NOTE — Assessment & Plan Note (Signed)
Summary: 3 MONTH ROV/NJR   Vital Signs:  Patient profile:   60 year old female Height:      62.5 inches Weight:      214 pounds BMI:     38.66 Temp:     98.2 degrees F oral Pulse rate:   80 / minute Resp:     14 per minute BP sitting:   140 / 84  (left arm)  Vitals Entered By: Willy Eddy, LPN (December 08, 2009 9:20 AM) CC: roa labs   Primary Care Provider:  Stacie Glaze MD  CC:  roa labs.  History of Present Illness: the pt is not exercizing and walking she has lost control of DM her work schedule has prevented her form waking as well she has not gained weight the pt presents for review of her AiC and her HTN readings the back pain is controlled but she is fearfull of recurrance  Preventive Screening-Counseling & Management  Alcohol-Tobacco     Smoking Status: never  Problems Prior to Update: 1)  Low Back Pain, Acute  (ICD-724.2) 2)  Preventive Health Care  (ICD-V70.0) 3)  Hypersomnia With Sleep Apnea Unspecified  (ICD-780.53) 4)  Snoring  (ICD-786.09) 5)  Rt Bundle Branch Block&lt Ant Fascicular Block  (ICD-426.52) 6)  Back Pain, Lumbar, With Radiculopathy  (ICD-724.4) 7)  Rotator Cuff Syndrome, Left  (ICD-726.10) 8)  Osteoarthritis, Moderate  (ICD-715.90) 9)  Pelvic Pain, Chronic  (ICD-789.09) 10)  Onychomycosis  (ICD-110.1) 11)  Acute Lymphadenitis  (ICD-683) 12)  Liver Function Tests, Abnormal  (ICD-794.8) 13)  Hiatal Hernia  (ICD-553.3) 14)  Esophageal Stricture  (ICD-530.3) 15)  Fasciitis, Plantar  (ICD-728.71) 16)  Family History Breast Cancer 1st Degree Relative <50  (ICD-V16.3) 17)  Gerd  (ICD-530.81) 18)  Depression  (ICD-311) 19)  Hyperlipidemia  (ICD-272.4) 20)  Diabetes Mellitus, Type II  (ICD-250.00) 21)  Hypertension  (ICD-401.9)  Current Problems (verified): 1)  Low Back Pain, Acute  (ICD-724.2) 2)  Preventive Health Care  (ICD-V70.0) 3)  Hypersomnia With Sleep Apnea Unspecified  (ICD-780.53) 4)  Snoring  (ICD-786.09) 5)  Rt Bundle  Branch Block&lt Ant Fascicular Block  (ICD-426.52) 6)  Back Pain, Lumbar, With Radiculopathy  (ICD-724.4) 7)  Rotator Cuff Syndrome, Left  (ICD-726.10) 8)  Osteoarthritis, Moderate  (ICD-715.90) 9)  Pelvic Pain, Chronic  (ICD-789.09) 10)  Onychomycosis  (ICD-110.1) 11)  Acute Lymphadenitis  (ICD-683) 12)  Liver Function Tests, Abnormal  (ICD-794.8) 13)  Hiatal Hernia  (ICD-553.3) 14)  Esophageal Stricture  (ICD-530.3) 15)  Fasciitis, Plantar  (ICD-728.71) 16)  Family History Breast Cancer 1st Degree Relative <50  (ICD-V16.3) 17)  Gerd  (ICD-530.81) 18)  Depression  (ICD-311) 19)  Hyperlipidemia  (ICD-272.4) 20)  Diabetes Mellitus, Type II  (ICD-250.00) 21)  Hypertension  (ICD-401.9)  Medications Prior to Update: 1)  Janumet 50-500 Mg Tabs (Sitagliptin-Metformin Hcl) .... Take 1 Tablet By Mouth Twice A Day 2)  Lipitor 10 Mg Tabs (Atorvastatin Calcium) .... Once Daily 3)  Omeprazole 40 Mg  Cpdr (Omeprazole) .Marland Kitchen.. 1 By Mouth Daily 4)  Freestyle Lite   Strp (Glucose Blood) .... Use Two Times A Day As Directed For Cbg's  ( 250.82 5)  Micardis Hct 40-12.5 Mg Tabs (Telmisartan-Hctz) .... Take 1 Tablet By Mouth Once A Day 6)  Naprosyn 500 Mg Tabs (Naproxen) .Marland Kitchen.. 1 Tab By Mouth Two Times A Day With Meals 7)  Flexeril 10 Mg Tabs (Cyclobenzaprine Hcl) .Marland Kitchen.. 1 Tab By Mouth At Bedtime As Needed Back Pain  8)  Hydrocodone-Acetaminophen 5-500 Mg Tabs (Hydrocodone-Acetaminophen) .Marland Kitchen.. 1 Tab By Mouth Three Times A Day As Needed Severe Back Pain  Current Medications (verified): 1)  Janumet 50-1000 Mg Tabs (Sitagliptin-Metformin Hcl) .... One By Mouth Bid 2)  Lipitor 10 Mg Tabs (Atorvastatin Calcium) .... Once Daily 3)  Omeprazole 40 Mg  Cpdr (Omeprazole) .Marland Kitchen.. 1 By Mouth Daily 4)  Freestyle Lite   Strp (Glucose Blood) .... Use Two Times A Day As Directed For Cbg's  ( 250.82 5)  Micardis Hct 40-12.5 Mg Tabs (Telmisartan-Hctz) .... Take 1 Tablet By Mouth Once A Day  Allergies (verified): 1)  ! Codeine 2)   * Amoxil  Past History:  Family History: Last updated: February 10, 2009 father   CHF mother died of BRCA at age 35  Social History: Last updated: 02-10-09 Married.  Lives in [place] with spouse.  Unemployed.  Previous Engineer, mining.  Never Smoked Alcohol use-no Drug use-no  Risk Factors: Exercise: no (03/22/2007)  Risk Factors: Smoking Status: never (12/08/2009)  Past medical, surgical, family and social histories (including risk factors) reviewed, and no changes noted (except as noted below).  Past Medical History: Reviewed history from 2009-02-10 and no changes required. HIATAL HERNIA (ICD-553.3) ESOPHAGEAL STRICTURE (ICD-530.3) LIVER FUNCTION TESTS, ABNORMAL (ICD-794.8)- Presumed fatty liver disease FASCIITIS, PLANTAR (ICD-728.71) OTALGIA (ICD-388.70) GERD (ICD-530.81) SLEEP APNEA (ICD-780.57) DEPRESSION (ICD-311) HYPERLIPIDEMIA (ICD-272.4) DIABETES MELLITUS, TYPE II (ICD-250.00) HYPERTENSION (ICD-401.9)  Past Surgical History: Reviewed history from 2009-02-10 and no changes required. APPENDECTOMY HYSTERECTOMY FOR ENDOMETREOSIS BENIGN CYST IN LUNG REMOVED BY DR Edwyna Shell  Family History: Reviewed history from Feb 10, 2009 and no changes required. father   CHF mother died of BRCA at age 58  Social History: Reviewed history from 2009-02-10 and no changes required. Married.  Lives in [place] with spouse.  Unemployed.  Previous Engineer, mining.  Never Smoked Alcohol use-no Drug use-no  Review of Systems  The patient denies anorexia, fever, weight loss, weight gain, vision loss, decreased hearing, hoarseness, chest pain, syncope, dyspnea on exertion, peripheral edema, prolonged cough, headaches, hemoptysis, abdominal pain, melena, hematochezia, severe indigestion/heartburn, hematuria, incontinence, genital sores, muscle weakness, suspicious skin lesions, transient blindness, difficulty walking, depression, unusual weight change, abnormal bleeding, enlarged lymph  nodes, angioedema, and breast masses.         bacl pain chronic  Physical Exam  General:  obese AAF in mild distress Head:  Normocephalic and atraumatic without obvious abnormalities. No apparent alopecia or balding. Eyes:  No corneal or conjunctival inflammation noted. EOMI. Perrla. Funduscopic exam benign, without hemorrhages, exudates or papilledema. Vision grossly normal. Ears:  both tympanic membranes and canals were clear Nose:  no external deformity and no nasal discharge.   Mouth:  good dentition and pharynx pink and moist.   Neck:  supple and full ROM.   Lungs:  Normal respiratory effort, chest expands symmetrically. Lungs are clear to auscultation, no crackles or wheezes. Heart:  Normal rate and regular rhythm. S1 and S2 normal without gallop, murmur, click, rub or other extra sounds. Abdomen:  Bowel sounds positive,abdomen soft and non-tender without masses, organomegaly or hernias noted. Skin:  kelooid on back Cervical Nodes:  No lymphadenopathy noted Axillary Nodes:  No palpable lymphadenopathy  Diabetes Management Exam:    Eye Exam:       Eye Exam done elsewhere          Date: 05/14/2009          Results: normal          Done by: hollander   Impression &  Recommendations:  Problem # 1:  DIABETES MELLITUS, TYPE II (ICD-250.00)  Her updated medication list for this problem includes:    Janumet 50-1000 Mg Tabs (Sitagliptin-metformin hcl) ..... One by mouth bid    Micardis Hct 40-12.5 Mg Tabs (Telmisartan-hctz) .Marland Kitchen... Take 1 tablet by mouth once a day  Labs Reviewed: Creat: 0.9 (08/22/2009)    Reviewed HgBA1c results: 8.4 (12/01/2009)  7.2 (08/22/2009)  Problem # 2:  HYPERTENSION (ICD-401.9)  Her updated medication list for this problem includes:    Micardis Hct 40-12.5 Mg Tabs (Telmisartan-hctz) .Marland Kitchen... Take 1 tablet by mouth once a day  BP today: 140/84 Prior BP: 136/92 (11/01/2009)  Prior 10 Yr Risk Heart Disease: 15 % (07/05/2008)  Labs Reviewed: K+: 4.1  (08/22/2009) Creat: : 0.9 (08/22/2009)   Chol: 130 (08/22/2009)   HDL: 45.20 (08/22/2009)   LDL: 67 (08/22/2009)   TG: 88.0 (08/22/2009)  Problem # 3:  BACK PAIN, LUMBAR, WITH RADICULOPATHY (ICD-724.4) increased back pain and not walking has limited time for exercize due to change in work schedule the pt is off the hydrocodone but not exercizing the pt did not go to PT The following medications were removed from the medication list:    Naprosyn 500 Mg Tabs (Naproxen) .Marland Kitchen... 1 tab by mouth two times a day with meals    Flexeril 10 Mg Tabs (Cyclobenzaprine hcl) .Marland Kitchen... 1 tab by mouth at bedtime as needed back pain    Hydrocodone-acetaminophen 5-500 Mg Tabs (Hydrocodone-acetaminophen) .Marland Kitchen... 1 tab by mouth three times a day as needed severe back pain  Discussed use of moist heat or ice, modified activities, medications, and stretching/strengthening exercises. Back care instructions given. To be seen in 2 weeks if no improvement; sooner if worsening of symptoms.   Complete Medication List: 1)  Janumet 50-1000 Mg Tabs (Sitagliptin-metformin hcl) .... One by mouth bid 2)  Lipitor 10 Mg Tabs (Atorvastatin calcium) .... Once daily 3)  Omeprazole 40 Mg Cpdr (Omeprazole) .Marland Kitchen.. 1 by mouth daily 4)  Freestyle Lite Strp (Glucose blood) .... Use two times a day as directed for cbg's  ( 250.82 5)  Micardis Hct 40-12.5 Mg Tabs (Telmisartan-hctz) .... Take 1 tablet by mouth once a day  Patient Instructions: 1)  Please schedule a follow-up appointment in 2 months. 2)  BMP prior to visit, ICD-9:995.20 3)  HbgA1C prior to visit, ICD-9:250.00

## 2010-07-14 NOTE — Assessment & Plan Note (Signed)
Summary: 2 month rov/njr   Vital Signs:  Patient profile:   60 year old female Height:      62.5 inches Weight:      210 pounds BMI:     37.93 Temp:     98.2 degrees F oral Pulse rate:   76 / minute Resp:     14 per minute BP sitting:   132 / 80  (left arm)  Vitals Entered By: Willy Eddy, LPN (January 26, 2010 9:53 AM) CC: roas labs Is Patient Diabetic? No   Primary Care Provider:  Stacie Glaze MD  CC:  roas labs.  History of Present Illness: weight issues and DM folow up has lost a few pounds but is struggling with diet mild neuropaty in feet risks of weigth age HTN and DM HTN well controlled with single angent diet and monitering discussed   Preventive Screening-Counseling & Management  Alcohol-Tobacco     Smoking Status: never  Problems Prior to Update: 1)  Low Back Pain, Acute  (ICD-724.2) 2)  Preventive Health Care  (ICD-V70.0) 3)  Hypersomnia With Sleep Apnea Unspecified  (ICD-780.53) 4)  Snoring  (ICD-786.09) 5)  Rt Bundle Branch Block&lt Ant Fascicular Block  (ICD-426.52) 6)  Back Pain, Lumbar, With Radiculopathy  (ICD-724.4) 7)  Rotator Cuff Syndrome, Left  (ICD-726.10) 8)  Osteoarthritis, Moderate  (ICD-715.90) 9)  Pelvic Pain, Chronic  (ICD-789.09) 10)  Onychomycosis  (ICD-110.1) 11)  Acute Lymphadenitis  (ICD-683) 12)  Liver Function Tests, Abnormal  (ICD-794.8) 13)  Hiatal Hernia  (ICD-553.3) 14)  Esophageal Stricture  (ICD-530.3) 15)  Fasciitis, Plantar  (ICD-728.71) 16)  Family History Breast Cancer 1st Degree Relative <50  (ICD-V16.3) 17)  Gerd  (ICD-530.81) 18)  Depression  (ICD-311) 19)  Hyperlipidemia  (ICD-272.4) 20)  Diabetes Mellitus, Type II  (ICD-250.00) 21)  Hypertension  (ICD-401.9)  Current Problems (verified): 1)  Low Back Pain, Acute  (ICD-724.2) 2)  Preventive Health Care  (ICD-V70.0) 3)  Hypersomnia With Sleep Apnea Unspecified  (ICD-780.53) 4)  Snoring  (ICD-786.09) 5)  Rt Bundle Branch Block&lt Ant Fascicular  Block  (ICD-426.52) 6)  Back Pain, Lumbar, With Radiculopathy  (ICD-724.4) 7)  Rotator Cuff Syndrome, Left  (ICD-726.10) 8)  Osteoarthritis, Moderate  (ICD-715.90) 9)  Pelvic Pain, Chronic  (ICD-789.09) 10)  Onychomycosis  (ICD-110.1) 11)  Acute Lymphadenitis  (ICD-683) 12)  Liver Function Tests, Abnormal  (ICD-794.8) 13)  Hiatal Hernia  (ICD-553.3) 14)  Esophageal Stricture  (ICD-530.3) 15)  Fasciitis, Plantar  (ICD-728.71) 16)  Family History Breast Cancer 1st Degree Relative <50  (ICD-V16.3) 17)  Gerd  (ICD-530.81) 18)  Depression  (ICD-311) 19)  Hyperlipidemia  (ICD-272.4) 20)  Diabetes Mellitus, Type II  (ICD-250.00) 21)  Hypertension  (ICD-401.9)  Medications Prior to Update: 1)  Janumet 50-1000 Mg Tabs (Sitagliptin-Metformin Hcl) .... One By Mouth Bid 2)  Lipitor 10 Mg Tabs (Atorvastatin Calcium) .... Once Daily 3)  Omeprazole 40 Mg  Cpdr (Omeprazole) .Marland Kitchen.. 1 By Mouth Daily 4)  Freestyle Lite   Strp (Glucose Blood) .... Use Two Times A Day As Directed For Cbg's  ( 250.82 5)  Micardis Hct 40-12.5 Mg Tabs (Telmisartan-Hctz) .... Take 1 Tablet By Mouth Once A Day  Current Medications (verified): 1)  Kombiglyze Xr 10-998 Mg Xr24h-Tab (Saxagliptin-Metformin) .... One By Mouth Daily 2)  Lipitor 10 Mg Tabs (Atorvastatin Calcium) .... Once Daily 3)  Omeprazole 40 Mg  Cpdr (Omeprazole) .Marland Kitchen.. 1 By Mouth Daily 4)  Freestyle Lite   Strp (Glucose Blood) .Marland KitchenMarland KitchenMarland Kitchen  Use Two Times A Day As Directed For Cbg's  ( 250.82 5)  Micardis Hct 40-12.5 Mg Tabs (Telmisartan-Hctz) .... Take 1 Tablet By Mouth Once A Day 6)  Phentermine Hcl 37.5 Mg Caps (Phentermine Hcl) .... One By Mouth Daily  Allergies (verified): 1)  ! Codeine 2)  * Amoxil  Past History:  Family History: Last updated: 02-14-09 father   CHF mother died of BRCA at age 42  Social History: Last updated: 2009-02-14 Married.  Lives in [place] with spouse.  Unemployed.  Previous Engineer, mining.  Never Smoked Alcohol use-no Drug  use-no  Risk Factors: Exercise: no (03/22/2007)  Risk Factors: Smoking Status: never (01/26/2010)  Past medical, surgical, family and social histories (including risk factors) reviewed, and no changes noted (except as noted below).  Past Medical History: Reviewed history from 02-14-09 and no changes required. HIATAL HERNIA (ICD-553.3) ESOPHAGEAL STRICTURE (ICD-530.3) LIVER FUNCTION TESTS, ABNORMAL (ICD-794.8)- Presumed fatty liver disease FASCIITIS, PLANTAR (ICD-728.71) OTALGIA (ICD-388.70) GERD (ICD-530.81) SLEEP APNEA (ICD-780.57) DEPRESSION (ICD-311) HYPERLIPIDEMIA (ICD-272.4) DIABETES MELLITUS, TYPE II (ICD-250.00) HYPERTENSION (ICD-401.9)  Past Surgical History: Reviewed history from 2009-02-14 and no changes required. APPENDECTOMY HYSTERECTOMY FOR ENDOMETREOSIS BENIGN CYST IN LUNG REMOVED BY DR Edwyna Shell  Family History: Reviewed history from 14-Feb-2009 and no changes required. father   CHF mother died of BRCA at age 50  Social History: Reviewed history from 02-14-09 and no changes required. Married.  Lives in [place] with spouse.  Unemployed.  Previous Engineer, mining.  Never Smoked Alcohol use-no Drug use-no  Review of Systems  The patient denies anorexia, fever, weight loss, weight gain, vision loss, decreased hearing, hoarseness, chest pain, syncope, dyspnea on exertion, peripheral edema, prolonged cough, headaches, hemoptysis, abdominal pain, melena, hematochezia, severe indigestion/heartburn, hematuria, incontinence, genital sores, muscle weakness, suspicious skin lesions, transient blindness, difficulty walking, depression, unusual weight change, abnormal bleeding, enlarged lymph nodes, angioedema, and breast masses.    Physical Exam  General:  obese AAF in mild distress Head:  Normocephalic and atraumatic without obvious abnormalities. No apparent alopecia or balding. Eyes:  No corneal or conjunctival inflammation noted. EOMI. Perrla. Funduscopic exam  benign, without hemorrhages, exudates or papilledema. Vision grossly normal. Ears:  both tympanic membranes and canals were clear Nose:  no external deformity and no nasal discharge.   Mouth:  good dentition and pharynx pink and moist.   Neck:  supple and full ROM.   Lungs:  Normal respiratory effort, chest expands symmetrically. Lungs are clear to auscultation, no crackles or wheezes. Heart:  Normal rate and regular rhythm. S1 and S2 normal without gallop, murmur, click, rub or other extra sounds. Abdomen:  Bowel sounds positive,abdomen soft and non-tender without masses, organomegaly or hernias noted.  Diabetes Management Exam:    Foot Exam (with socks and/or shoes not present):       Sensory-Pinprick/Light touch:          Left medial foot (L-4): diminished          Left dorsal foot (L-5): diminished          Left lateral foot (S-1): diminished          Right medial foot (L-4): diminished          Right dorsal foot (L-5): diminished          Right lateral foot (S-1): diminished       Sensory-Monofilament:          Left foot: normal          Right foot: normal  Inspection:          Left foot: normal          Right foot: normal   Impression & Recommendations:  Problem # 1:  DIABETES MELLITUS, TYPE II (ICD-250.00) Assessment Improved slight improvement weight  loss and mendications change Her updated medication list for this problem includes:    Kombiglyze Xr 10-998 Mg Xr24h-tab (Saxagliptin-metformin) ..... One by mouth daily    Micardis Hct 40-12.5 Mg Tabs (Telmisartan-hctz) .Marland Kitchen... Take 1 tablet by mouth once a day  Labs Reviewed: Creat: 0.8 (01/19/2010)     Last Eye Exam: normal (05/14/2009) Reviewed HgBA1c results: 8.2 (01/19/2010)  8.4 (12/01/2009)  Problem # 2:  HYPERTENSION (ICD-401.9) Assessment: Unchanged  Her updated medication list for this problem includes:    Micardis Hct 40-12.5 Mg Tabs (Telmisartan-hctz) .Marland Kitchen... Take 1 tablet by mouth once a day  BP  today: 132/80 Prior BP: 140/84 (12/08/2009)  Prior 10 Yr Risk Heart Disease: 15 % (07/05/2008)  Labs Reviewed: K+: 4.2 (01/19/2010) Creat: : 0.8 (01/19/2010)   Chol: 130 (08/22/2009)   HDL: 45.20 (08/22/2009)   LDL: 67 (08/22/2009)   TG: 88.0 (08/22/2009)  Problem # 3:  HYPERLIPIDEMIA (ICD-272.4)  Her updated medication list for this problem includes:    Lipitor 10 Mg Tabs (Atorvastatin calcium) ..... Once daily  Labs Reviewed: SGOT: 28 (08/22/2009)   SGPT: 44 (08/22/2009)  Lipid Goals: Chol Goal: 200 (07/05/2008)   HDL Goal: 40 (07/05/2008)   LDL Goal: 100 (07/05/2008)   TG Goal: 150 (07/05/2008)  Prior 10 Yr Risk Heart Disease: 15 % (07/05/2008)   HDL:45.20 (08/22/2009), 40.10 (12/31/2008)  LDL:67 (08/22/2009), 89 (12/31/2008)  Chol:130 (08/22/2009), 150 (12/31/2008)  Trig:88.0 (08/22/2009), 104.0 (12/31/2008)  Complete Medication List: 1)  Kombiglyze Xr 10-998 Mg Xr24h-tab (Saxagliptin-metformin) .... One by mouth daily 2)  Lipitor 10 Mg Tabs (Atorvastatin calcium) .... Once daily 3)  Omeprazole 40 Mg Cpdr (Omeprazole) .Marland Kitchen.. 1 by mouth daily 4)  Freestyle Lite Strp (Glucose blood) .... Use two times a day as directed for cbg's  ( 250.82 5)  Micardis Hct 40-12.5 Mg Tabs (Telmisartan-hctz) .... Take 1 tablet by mouth once a day 6)  Phentermine Hcl 37.5 Mg Caps (Phentermine hcl) .... One by mouth daily  Patient Instructions: 1)  phenterimine is for weight loss , call if you feel aggitates or if blood pressure increases 2)  new DM medicine is the kombiglize one a day in the AM 3)  Please schedule a follow-up appointment in 3 months. 4)  Hepatic Panel prior to visit, ICD-9:995.20 5)  HbgA1C prior to visit, ICD-9:250.00 Prescriptions: PHENTERMINE HCL 37.5 MG CAPS (PHENTERMINE HCL) one by mouth daily  #30 x 3   Entered and Authorized by:   Stacie Glaze MD   Signed by:   Stacie Glaze MD on 01/26/2010   Method used:   Print then Give to Patient   RxID:    [id number] KOMBIGLYZE XR 10-998 MG XR24H-TAB (SAXAGLIPTIN-METFORMIN) one by mouth daily  #30 x 3   Entered and Authorized by:   Stacie Glaze MD   Signed by:   Stacie Glaze MD on 01/26/2010   Method used:   Electronically to        [facility]  [address] [phone][facility] (retail)       [address] [place], [place]  [address]       Ph: [phone] or [phone]  Fax: [phone]   RxID:   [id number]

## 2010-07-14 NOTE — Medication Information (Signed)
Summary: Order for CPAP Supplies/[facility]  Order for CPAP Supplies/[facility]   Imported By: Maryln Gottron 10/28/2009 10:12:19  _____________________________________________________________________  External Attachment:    Type:   Image     Comment:   External Document

## 2010-07-14 NOTE — Letter (Signed)
Summary: Attending Physician's Statement of Disability  Attending Physician's Statement of Disability   Imported By: Maryln Gottron 11/27/2009 15:43:33  _____________________________________________________________________  External Attachment:    Type:   Image     Comment:   External Document

## 2010-07-14 NOTE — Progress Notes (Signed)
Summary: requesting generic Lipitor  Phone Note From Pharmacy Call back at [phone]   Caller: [facility]  [address] [phone]* Reason for Call: Patient requests substitution Summary of Call: wants to fill Lipitor 10 mg in the generic form, because it is cheaper. Is this ok to fill? Initial call taken by: Warnell Forester,  May 22, 2010 8:54 AM    Prescriptions: LIPITOR 10 MG TABS (ATORVASTATIN CALCIUM) once daily  #30 Tablet x 7   Entered by:   Willy Eddy, LPN   Authorized by:   Stacie Glaze MD   Signed by:   Willy Eddy, LPN on 30/16/0109   Method used:   [facility]ronically to        [facility]  [address] [phone][facility] (retail)       [address]       [place], [place]  [address]       Ph: [phone] or [phone]       Fax: [phone]   RxID:   [id number]

## 2010-07-14 NOTE — Progress Notes (Signed)
Summary: low back pain  Phone Note Call from Patient   Caller: Patient Call For: Stacie Glaze MD Summary of Call: Pt is calling for an appt with Dr. Lovell Sheehan.  Complaining of low back pain (acute).  No injury.  When she walks, the pain makes her fall. [phone] Initial call taken by: Lynann Beaver CMA,  Oct 31, 2009 8:39 AM  Follow-up for Phone Call        per dr Liborio Nixon spine complete xray today  pt notified and will go for xrays. Lynann Beaver CMA  Oct 31, 2009 9:17 AM  Follow-up by: Willy Eddy, LPN,  Oct 31, 2009 9:09 AM  Additional Follow-up for Phone Call Additional follow up Details #1::        had xrays and results not back- pt given number to saturday clinic and she will cal in am for appointment - xrays will be back by then Additional Follow-up by: Willy Eddy, LPN,  Oct 31, 2009 1:47 PM  New Problems: LOW BACK PAIN, ACUTE (ICD-724.2)   New Problems: LOW BACK PAIN, ACUTE (ICD-724.2)

## 2010-07-14 NOTE — Assessment & Plan Note (Signed)
Summary: cpx/mm   Vital Signs:  Patient profile:   60 year old female Height:      62.5 inches Weight:      210 pounds BMI:     37.93 Temp:     98.2 degrees F oral Pulse rate:   80 / minute Resp:     14 per minute BP sitting:   110 / 80  (left arm) Cuff size:   large  Vitals Entered By: Willy Eddy, LPN (August 29, 2009 11:22 AM)  Nutrition Counseling: Patient's BMI is greater than 25 and therefore counseled on weight management options.  Contraindications/Deferment of Procedures/Staging:    Test/Procedure: FLU VAX    Reason for deferment: patient declined  CC: cpx   Primary Care Provider:  Stacie Glaze MD  CC:  cpx.  History of Present Illness: The pt was asked about all immunizations, health maint. services that are appropriate to their age and was given guidance on diet exercize  and weight management   Preventive Screening-Counseling & Management  Alcohol-Tobacco     Smoking Status: never  Problems Prior to Update: 1)  Hypersomnia With Sleep Apnea Unspecified  (ICD-780.53) 2)  Snoring  (ICD-786.09) 3)  Shortness of Breath  (ICD-786.05) 4)  Palpitations, Recurrent  (ICD-785.1) 5)  Rt Bundle Branch Block&lt Ant Fascicular Block  (ICD-426.52) 6)  Back Pain, Lumbar, With Radiculopathy  (ICD-724.4) 7)  Rotator Cuff Syndrome, Left  (ICD-726.10) 8)  Osteoarthritis, Moderate  (ICD-715.90) 9)  Pelvic Pain, Chronic  (ICD-789.09) 10)  Onychomycosis  (ICD-110.1) 11)  Acute Lymphadenitis  (ICD-683) 12)  Liver Function Tests, Abnormal  (ICD-794.8) 13)  Sialoadenitis  (ICD-527.2) 14)  Hiatal Hernia  (ICD-553.3) 15)  Esophageal Stricture  (ICD-530.3) 16)  Fasciitis, Plantar  (ICD-728.71) 17)  Otalgia  (ICD-388.70) 18)  Disorder, Esophageal Nec  (ICD-530.89) 19)  Chest Pain, Pleuritic  (ICD-786.52) 20)  Family History Breast Cancer 1st Degree Relative <50  (ICD-V16.3) 21)  Gerd  (ICD-530.81) 22)  Sleep Apnea  (ICD-780.57) 23)  Depression  (ICD-311) 24)   Hyperlipidemia  (ICD-272.4) 25)  Diabetes Mellitus, Type II  (ICD-250.00) 26)  Hypertension  (ICD-401.9)  Medications Prior to Update: 1)  Benicar Hct 20-12.5 Mg Tabs (Olmesartan Medoxomil-Hctz) .... Take 1 Tablet By Mouth Once A Day 2)  Janumet 50-500 Mg Tabs (Sitagliptin-Metformin Hcl) .... Take 1 Tablet By Mouth Twice A Day 3)  Lipitor 10 Mg Tabs (Atorvastatin Calcium) .... Once Daily 4)  Omeprazole 40 Mg  Cpdr (Omeprazole) .Marland Kitchen.. 1 By Mouth Daily 5)  Freestyle Lite   Strp (Glucose Blood) .... Use Two Times A Day As Directed For Cbg's  ( 250.82 6)  Milk Thistle 500 Mg Caps (Milk Thistle) .Marland Kitchen.. 1000 Mg Per Day 7)  Vitamin B-12 2500 Mcg Subl (Cyanocobalamin) .... Once Daily 8)  Micardis Hct 40-12.5 Mg Tabs (Telmisartan-Hctz) .... Take 1 Tablet By Mouth Once A Day  Current Medications (verified): 1)  Janumet 50-500 Mg Tabs (Sitagliptin-Metformin Hcl) .... Take 1 Tablet By Mouth Twice A Day 2)  Lipitor 10 Mg Tabs (Atorvastatin Calcium) .... Once Daily 3)  Omeprazole 40 Mg  Cpdr (Omeprazole) .Marland Kitchen.. 1 By Mouth Daily 4)  Freestyle Lite   Strp (Glucose Blood) .... Use Two Times A Day As Directed For Cbg's  ( 250.82 5)  Micardis Hct 40-12.5 Mg Tabs (Telmisartan-Hctz) .... Take 1 Tablet By Mouth Once A Day  Allergies (verified): 1)  ! Codeine 2)  * Amoxil  Past History:  Family History: Last updated: 01/30/2009  father   CHF mother died of BRCA at age 42  Social History: Last updated: 01/30/2009 Married.  Lives in [place] with spouse.  Unemployed.  Previous Engineer, mining.  Never Smoked Alcohol use-no Drug use-no  Risk Factors: Exercise: no (03/22/2007)  Risk Factors: Smoking Status: never (08/29/2009)  Past medical, surgical, family and social histories (including risk factors) reviewed for relevance to current acute and chronic problems.  Past Medical History: Reviewed history from 01/30/2009 and no changes required. HIATAL HERNIA (ICD-553.3) ESOPHAGEAL STRICTURE  (ICD-530.3) LIVER FUNCTION TESTS, ABNORMAL (ICD-794.8)- Presumed fatty liver disease FASCIITIS, PLANTAR (ICD-728.71) OTALGIA (ICD-388.70) GERD (ICD-530.81) SLEEP APNEA (ICD-780.57) DEPRESSION (ICD-311) HYPERLIPIDEMIA (ICD-272.4) DIABETES MELLITUS, TYPE II (ICD-250.00) HYPERTENSION (ICD-401.9)  Past Surgical History: Reviewed history from 01/30/2009 and no changes required. APPENDECTOMY HYSTERECTOMY FOR ENDOMETREOSIS BENIGN CYST IN LUNG REMOVED BY DR Edwyna Shell  Family History: Reviewed history from 01/30/2009 and no changes required. father   CHF mother died of BRCA at age 65  Social History: Reviewed history from 01/30/2009 and no changes required. Married.  Lives in [place] with spouse.  Unemployed.  Previous Engineer, mining.  Never Smoked Alcohol use-no Drug use-no  Review of Systems  The patient denies anorexia, fever, weight loss, weight gain, vision loss, decreased hearing, hoarseness, chest pain, syncope, dyspnea on exertion, peripheral edema, prolonged cough, headaches, hemoptysis, abdominal pain, melena, hematochezia, severe indigestion/heartburn, hematuria, incontinence, genital sores, muscle weakness, suspicious skin lesions, transient blindness, difficulty walking, depression, unusual weight change, abnormal bleeding, enlarged lymph nodes, angioedema, and breast masses.    Physical Exam  General:  overweight-appearing.  well-developed.   Head:  Normocephalic and atraumatic without obvious abnormalities. No apparent alopecia or balding. Eyes:  No corneal or conjunctival inflammation noted. EOMI. Perrla. Funduscopic exam benign, without hemorrhages, exudates or papilledema. Vision grossly normal. Ears:  both tympanic membranes and canals were clear Nose:  no external deformity and no nasal discharge.   Mouth:  good dentition and pharynx pink and moist.   Neck:  No deformities, masses, or tenderness noted. Lungs:  Normal respiratory effort, chest expands symmetrically.  Lungs are clear to auscultation, no crackles or wheezes. Heart:  Normal rate and regular rhythm. S1 and S2 normal without gallop, murmur, click, rub or other extra sounds. Abdomen:  Bowel sounds positive,abdomen soft and non-tender without masses, organomegaly or hernias noted. Msk:  no joint tenderness and no joint swelling.   Extremities:  No clubbing, cyanosis, edema, or deformity noted with normal full range of motion of all joints.   Skin:  Intact without suspicious lesions or rashes Cervical Nodes:  No lymphadenopathy noted Axillary Nodes:  No palpable lymphadenopathy Psych:  Oriented X3 and good eye contact.    Diabetes Management Exam:    Foot Exam (with socks and/or shoes not present):       Sensory-Pinprick/Light touch:          Left medial foot (L-4): normal          Left dorsal foot (L-5): normal          Left lateral foot (S-1): normal          Right medial foot (L-4): normal          Right dorsal foot (L-5): normal          Right lateral foot (S-1): normal       Sensory-Monofilament:          Left foot: normal          Right foot: normal  Inspection:          Left foot: normal          Right foot: normal       Nails:          Left foot: normal          Right foot: normal   Impression & Recommendations:  Problem # 1:  PREVENTIVE HEALTH CARE (ICD-V70.0) The pt was asked about all immunizations, health maint. services that are appropriate to their age and was given guidance on diet exercize  and weight management  Mammogram: normal (08/25/2009) Pap smear: normal (08/12/2008) Colonoscopy: normal (08/17/2002) Td Booster: Td (10/12/2001)   Flu Vax: Fluvax 3+ (03/19/2008)   Chol: 130 (08/22/2009)   HDL: 45.20 (08/22/2009)   LDL: 67 (08/22/2009)   TG: 88.0 (08/22/2009) TSH: 1.27 (08/22/2009)   HgbA1C: 7.2 (08/22/2009)   Next mammogram due:: 08/2010 (08/29/2009) Next Colonoscopy due:: 08/2010 (08/29/2009)  Discussed using sunscreen, use of alcohol, drug use, self  breast exam, routine dental care, routine eye care, schedule for GYN exam, routine physical exam, seat belts, multiple vitamins, osteoporosis prevention, adequate calcium intake in diet, recommendations for immunizations, mammograms and Pap smears.  Discussed exercise and checking cholesterol.  Discussed gun safety, safe sex, and contraception.  Complete Medication List: 1)  Janumet 50-500 Mg Tabs (Sitagliptin-metformin hcl) .... Take 1 tablet by mouth twice a day 2)  Lipitor 10 Mg Tabs (Atorvastatin calcium) .... Once daily 3)  Omeprazole 40 Mg Cpdr (Omeprazole) .Marland Kitchen.. 1 by mouth daily 4)  Freestyle Lite Strp (Glucose blood) .... Use two times a day as directed for cbg's  ( 250.82 5)  Micardis Hct 40-12.5 Mg Tabs (Telmisartan-hctz) .... Take 1 tablet by mouth once a day  Patient Instructions: 1)  Please schedule a follow-up appointment in 3 months. 2)  HbgA1C prior to visit, ICD-9:250.00   Preventive Care Screening  Colonoscopy:    Date:  08/17/2002    Next Due:  08/2010    Results:  normal   Mammogram:    Date:  08/25/2009    Next Due:  08/2010    Results:  normal   Pap Smear:    Date:  08/12/2008    Next Due:  08/2009    Results:  normal

## 2010-07-14 NOTE — Assessment & Plan Note (Signed)
Summary: 3 month rov/njr   Vital Signs:  Patient profile:   59 year old female Height:      62.5 inches Weight:      208 pounds BMI:     37.57 Temp:     98.2 degrees F oral Pulse rate:   76 / minute Resp:     14 per minute BP sitting:   130 / 80  (left arm)  Vitals Entered By: Willy Eddy, LPN (April 28, 2010 9:52 AM) CC: roa labs Is Patient Diabetic? No   Primary Care Provider:  Stacie Glaze MD  CC:  roa labs.  History of Present Illness: the pt was taking the  kombiglyize two times a day and the phenterimine... stopped due to papitations she noted the instructions and changed back to only on a day about  3-4 weks prior to today   Preventive Screening-Counseling & Management  Alcohol-Tobacco     Smoking Status: never  Problems Prior to Update: 1)  Low Back Pain, Acute  (ICD-724.2) 2)  Preventive Health Care  (ICD-V70.0) 3)  Hypersomnia With Sleep Apnea Unspecified  (ICD-780.53) 4)  Snoring  (ICD-786.09) 5)  Rt Bundle Branch Block&lt Ant Fascicular Block  (ICD-426.52) 6)  Back Pain, Lumbar, With Radiculopathy  (ICD-724.4) 7)  Rotator Cuff Syndrome, Left  (ICD-726.10) 8)  Osteoarthritis, Moderate  (ICD-715.90) 9)  Pelvic Pain, Chronic  (ICD-789.09) 10)  Onychomycosis  (ICD-110.1) 11)  Acute Lymphadenitis  (ICD-683) 12)  Liver Function Tests, Abnormal  (ICD-794.8) 13)  Hiatal Hernia  (ICD-553.3) 14)  Esophageal Stricture  (ICD-530.3) 15)  Fasciitis, Plantar  (ICD-728.71) 16)  Family History Breast Cancer 1st Degree Relative <50  (ICD-V16.3) 17)  Gerd  (ICD-530.81) 18)  Depression  (ICD-311) 19)  Hyperlipidemia  (ICD-272.4) 20)  Diabetes Mellitus, Type II  (ICD-250.00) 21)  Hypertension  (ICD-401.9)  Current Problems (verified): 1)  Low Back Pain, Acute  (ICD-724.2) 2)  Preventive Health Care  (ICD-V70.0) 3)  Hypersomnia With Sleep Apnea Unspecified  (ICD-780.53) 4)  Snoring  (ICD-786.09) 5)  Rt Bundle Branch Block&lt Ant Fascicular Block   (ICD-426.52) 6)  Back Pain, Lumbar, With Radiculopathy  (ICD-724.4) 7)  Rotator Cuff Syndrome, Left  (ICD-726.10) 8)  Osteoarthritis, Moderate  (ICD-715.90) 9)  Pelvic Pain, Chronic  (ICD-789.09) 10)  Onychomycosis  (ICD-110.1) 11)  Acute Lymphadenitis  (ICD-683) 12)  Liver Function Tests, Abnormal  (ICD-794.8) 13)  Hiatal Hernia  (ICD-553.3) 14)  Esophageal Stricture  (ICD-530.3) 15)  Fasciitis, Plantar  (ICD-728.71) 16)  Family History Breast Cancer 1st Degree Relative <50  (ICD-V16.3) 17)  Gerd  (ICD-530.81) 18)  Depression  (ICD-311) 19)  Hyperlipidemia  (ICD-272.4) 20)  Diabetes Mellitus, Type II  (ICD-250.00) 21)  Hypertension  (ICD-401.9)  Medications Prior to Update: 1)  Kombiglyze Xr 10-998 Mg Xr24h-Tab (Saxagliptin-Metformin) .... One By Mouth Daily 2)  Lipitor 10 Mg Tabs (Atorvastatin Calcium) .... Once Daily 3)  Omeprazole 40 Mg  Cpdr (Omeprazole) .Marland Kitchen.. 1 By Mouth Daily 4)  Freestyle Lite   Strp (Glucose Blood) .... Use Two Times A Day As Directed For Cbg's  ( 250.82 5)  Micardis Hct 40-12.5 Mg Tabs (Telmisartan-Hctz) .... Take 1 Tablet By Mouth Once A Day 6)  Phentermine Hcl 37.5 Mg Caps (Phentermine Hcl) .... One By Mouth Daily  Current Medications (verified): 1)  Kombiglyze Xr 10-998 Mg Xr24h-Tab (Saxagliptin-Metformin) .... One By Mouth Daily 2)  Lipitor 10 Mg Tabs (Atorvastatin Calcium) .... Once Daily 3)  Omeprazole 40 Mg  Cpdr (Omeprazole) .Marland KitchenMarland KitchenMarland Kitchen  1 By Mouth Daily 4)  Freestyle Lite   Strp (Glucose Blood) .... Use Two Times A Day As Directed For Cbg's  ( 250.82 5)  Micardis Hct 40-12.5 Mg Tabs (Telmisartan-Hctz) .... Take 1 Tablet By Mouth Once A Day 6)  Phentermine Hcl 37.5 Mg Caps (Phentermine Hcl) .... One By Mouth Daily  Allergies (verified): 1)  ! Codeine 2)  * Amoxil  Past History:  Family History: Last updated: 2009-02-07 father   CHF mother died of BRCA at age 80  Social History: Last updated: Feb 07, 2009 Married.  Lives in [place] with spouse.   Unemployed.  Previous Engineer, mining.  Never Smoked Alcohol use-no Drug use-no  Risk Factors: Exercise: no (03/22/2007)  Risk Factors: Smoking Status: never (04/28/2010)  Past medical, surgical, family and social histories (including risk factors) reviewed, and no changes noted (except as noted below).  Past Medical History: Reviewed history from Feb 07, 2009 and no changes required. HIATAL HERNIA (ICD-553.3) ESOPHAGEAL STRICTURE (ICD-530.3) LIVER FUNCTION TESTS, ABNORMAL (ICD-794.8)- Presumed fatty liver disease FASCIITIS, PLANTAR (ICD-728.71) OTALGIA (ICD-388.70) GERD (ICD-530.81) SLEEP APNEA (ICD-780.57) DEPRESSION (ICD-311) HYPERLIPIDEMIA (ICD-272.4) DIABETES MELLITUS, TYPE II (ICD-250.00) HYPERTENSION (ICD-401.9)  Past Surgical History: Reviewed history from 2009-02-07 and no changes required. APPENDECTOMY HYSTERECTOMY FOR ENDOMETREOSIS BENIGN CYST IN LUNG REMOVED BY DR Edwyna Shell  Family History: Reviewed history from 2009-02-07 and no changes required. father   CHF mother died of BRCA at age 14  Social History: Reviewed history from 02/07/2009 and no changes required. Married.  Lives in [place] with spouse.  Unemployed.  Previous Engineer, mining.  Never Smoked Alcohol use-no Drug use-no  Review of Systems  The patient denies anorexia, fever, weight loss, weight gain, vision loss, decreased hearing, hoarseness, chest pain, syncope, dyspnea on exertion, peripheral edema, prolonged cough, headaches, hemoptysis, abdominal pain, melena, hematochezia, severe indigestion/heartburn, hematuria, incontinence, genital sores, muscle weakness, suspicious skin lesions, transient blindness, difficulty walking, depression, unusual weight change, abnormal bleeding, enlarged lymph nodes, angioedema, breast masses, and testicular masses.         Flu Vaccine Consent Questions     Do you have a history of severe allergic reactions to this vaccine? no    Any prior history of allergic  reactions to egg and/or gelatin? no    Do you have a sensitivity to the preservative Thimersol? no    Do you have a past history of Guillan-Barre Syndrome? no    Do you currently have an acute febrile illness? no    Have you ever had a severe reaction to latex? no    Vaccine information given and explained to patient? yes    Are you currently pregnant? no    Lot Number:[id number]   Exp Date:12/12/2010   Site Given  Left Deltoid IM   Physical Exam  General:  obese AAF in mild distress Head:  Normocephalic and atraumatic without obvious abnormalities. No apparent alopecia or balding. Eyes:  No corneal or conjunctival inflammation noted. EOMI. Perrla. Funduscopic exam benign, without hemorrhages, exudates or papilledema. Vision grossly normal. Ears:  both tympanic membranes and canals were clear Nose:  no external deformity and no nasal discharge.   Mouth:  good dentition and pharynx pink and moist.   Neck:  supple and full ROM.   Lungs:  Normal respiratory effort, chest expands symmetrically. Lungs are clear to auscultation, no crackles or wheezes. Heart:  Normal rate and regular rhythm. S1 and S2 normal without gallop, murmur, click, rub or other extra sounds.   Impression & Recommendations:  Problem # 1:  HYPERTENSION (ICD-401.9)  Her updated medication list for this problem includes:    Micardis Hct 40-12.5 Mg Tabs (Telmisartan-hctz) .Marland Kitchen... Take 1 tablet by mouth once a day  BP today: 130/80 Prior BP: 132/80 (01/26/2010)  Prior 10 Yr Risk Heart Disease: 15 % (07/05/2008)  Labs Reviewed: K+: 4.2 (01/19/2010) Creat: : 0.8 (01/19/2010)   Chol: 130 (08/22/2009)   HDL: 45.20 (08/22/2009)   LDL: 67 (08/22/2009)   TG: 88.0 (08/22/2009)  Problem # 2:  TRANSAMINASES, SERUM, ELEVATED (ICD-790.4) elevated liver functions  due to medicsatins or due to a recent viral ilness  Problem # 3:  DIABETES MELLITUS, TYPE II (ICD-250.00)  Her updated medication list for this problem includes:     Metformin Hcl 1000 Mg Tabs (Metformin hcl) ..... One by mouth two times a day    Micardis Hct 40-12.5 Mg Tabs (Telmisartan-hctz) .Marland Kitchen... Take 1 tablet by mouth once a day  Labs Reviewed: Creat: 0.8 (01/19/2010)     Last Eye Exam: normal (05/14/2009) Reviewed HgBA1c results: 7.7 (04/21/2010)  8.2 (01/19/2010)  Complete Medication List: 1)  Metformin Hcl 1000 Mg Tabs (Metformin hcl) .... One by mouth two times a day 2)  Lipitor 10 Mg Tabs (Atorvastatin calcium) .... Once daily 3)  Omeprazole 40 Mg Cpdr (Omeprazole) .Marland Kitchen.. 1 by mouth daily 4)  Freestyle Lite Strp (Glucose blood) .... Use two times a day as directed for cbg's  ( 250.82 5)  Micardis Hct 40-12.5 Mg Tabs (Telmisartan-hctz) .... Take 1 tablet by mouth once a day  Other Orders: Admin 1st Vaccine (85885) Flu Vaccine 49yrs + (02774)  Patient Instructions: 1)  Please schedule a follow-up appointment in 1 month. Prescriptions: METFORMIN HCL 1000 MG TABS (METFORMIN HCL) one by mouth two times a day  #60 x 11   Entered and Authorized by:   Stacie Glaze MD   Signed by:   Stacie Glaze MD on 04/28/2010   Method used:   [facility] to        [facility]  [address] [phone]* (retail)       [address]       [place], [place]  [address]       Ph: [phone] or [phone]       Fax: [phone]   RxID:   [id number]    Orders Added: 1)  Admin 1st Vaccine [90471] 2)  Flu Vaccine 26yrs + [75916] 3)  Est. Patient Level IV [38466]

## 2010-07-16 NOTE — Miscellaneous (Signed)
Summary: Patient Discontinuing CPAP therapy AMA/[facility]  Patient Discontinuing CPAP therapy AMA/[facility]   Imported By: Maryln Gottron 06/29/2010 13:24:04  _____________________________________________________________________  External Attachment:    Type:   Image     Comment:   External Document

## 2010-07-16 NOTE — Assessment & Plan Note (Signed)
Summary: rov/acm   Vital Signs:  Patient profile:   60 year old female Height:      62.5 inches Weight:      210 pounds BMI:     37.93 Temp:     98.2 degrees F oral Pulse rate:   72 / minute Resp:     14 per minute BP sitting:   126 / 80  (left arm)  Vitals Entered By: Willy Eddy, LPN (May 29, 2010 10:52 AM) CC: roa, Type 2 diabetes mellitus follow-up Is Patient Diabetic? No   Primary Care Provider:  Stacie Glaze MD  CC:  roa and Type 2 diabetes mellitus follow-up.  History of Present Illness: monitering of lipids and lver functions has not been using cpap blood pressure has been controled CBGs are moderately well controlled  Type 2 Diabetes Mellitus Follow-Up      This is a 59 year old woman who presents for Type 2 diabetes mellitus follow-up.  The patient denies polyuria, polydipsia, blurred vision, self managed hypoglycemia, hypoglycemia requiring help, weight loss, weight gain, and numbness of extremities.  The patient denies the following symptoms: neuropathic pain, chest pain, vomiting, orthostatic symptoms, poor wound healing, intermittent claudication, vision loss, and foot ulcer.  Since the last visit the patient reports good dietary compliance, not exercising regularly, and monitoring blood glucose.  The patient has been measuring capillary blood glucose before breakfast.    Preventive Screening-Counseling & Management  Alcohol-Tobacco     Smoking Status: never  Problems Prior to Update: 1)  Transaminases, Serum, Elevated  (ICD-790.4) 2)  Low Back Pain, Acute  (ICD-724.2) 3)  Preventive Health Care  (ICD-V70.0) 4)  Hypersomnia With Sleep Apnea Unspecified  (ICD-780.53) 5)  Snoring  (ICD-786.09) 6)  Rt Bundle Branch Block&lt Ant Fascicular Block  (ICD-426.52) 7)  Back Pain, Lumbar, With Radiculopathy  (ICD-724.4) 8)  Rotator Cuff Syndrome, Left  (ICD-726.10) 9)  Osteoarthritis, Moderate  (ICD-715.90) 10)  Pelvic Pain, Chronic  (ICD-789.09) 11)   Onychomycosis  (ICD-110.1) 12)  Acute Lymphadenitis  (ICD-683) 13)  Hiatal Hernia  (ICD-553.3) 14)  Esophageal Stricture  (ICD-530.3) 15)  Fasciitis, Plantar  (ICD-728.71) 16)  Family History Breast Cancer 1st Degree Relative <50  (ICD-V16.3) 17)  Gerd  (ICD-530.81) 18)  Depression  (ICD-311) 19)  Hyperlipidemia  (ICD-272.4) 20)  Diabetes Mellitus, Type II  (ICD-250.00) 21)  Hypertension  (ICD-401.9)  Current Problems (verified): 1)  Transaminases, Serum, Elevated  (ICD-790.4) 2)  Low Back Pain, Acute  (ICD-724.2) 3)  Preventive Health Care  (ICD-V70.0) 4)  Hypersomnia With Sleep Apnea Unspecified  (ICD-780.53) 5)  Snoring  (ICD-786.09) 6)  Rt Bundle Branch Block&lt Ant Fascicular Block  (ICD-426.52) 7)  Back Pain, Lumbar, With Radiculopathy  (ICD-724.4) 8)  Rotator Cuff Syndrome, Left  (ICD-726.10) 9)  Osteoarthritis, Moderate  (ICD-715.90) 10)  Pelvic Pain, Chronic  (ICD-789.09) 11)  Onychomycosis  (ICD-110.1) 12)  Acute Lymphadenitis  (ICD-683) 13)  Hiatal Hernia  (ICD-553.3) 14)  Esophageal Stricture  (ICD-530.3) 15)  Fasciitis, Plantar  (ICD-728.71) 16)  Family History Breast Cancer 1st Degree Relative <50  (ICD-V16.3) 17)  Gerd  (ICD-530.81) 18)  Depression  (ICD-311) 19)  Hyperlipidemia  (ICD-272.4) 20)  Diabetes Mellitus, Type II  (ICD-250.00) 21)  Hypertension  (ICD-401.9)  Medications Prior to Update: 1)  Kombiglyze Xr 10-998 Mg Xr24h-Tab (Saxagliptin-Metformin) .Marland Kitchen.. 1 Once Daily 2)  Lipitor 10 Mg Tabs (Atorvastatin Calcium) .... Once Daily 3)  Omeprazole 40 Mg  Cpdr (Omeprazole) .Marland Kitchen.. 1 By Mouth  Daily 4)  Freestyle Lite   Strp (Glucose Blood) .... Use Two Times A Day As Directed For Cbg's  ( 250.82 5)  Micardis Hct 40-12.5 Mg Tabs (Telmisartan-Hctz) .... Take 1 Tablet By Mouth Once A Day  Current Medications (verified): 1)  Metformin Hcl 1000 Mg Tabs (Metformin Hcl) .Marland Kitchen.. 1 Two Times A Day 2)  Lipitor 10 Mg Tabs (Atorvastatin Calcium) .... Once Daily 3)   Omeprazole 40 Mg  Cpdr (Omeprazole) .Marland Kitchen.. 1 By Mouth Daily 4)  Freestyle Lite   Strp (Glucose Blood) .... Use Two Times A Day As Directed For Cbg's  ( 250.82 5)  Micardis Hct 40-12.5 Mg Tabs (Telmisartan-Hctz) .... Take 1 Tablet By Mouth Once A Day  Allergies (verified): 1)  ! Codeine 2)  * Amoxil  Past History:  Family History: Last updated: 2009/02/25 father   CHF mother died of BRCA at age 17  Social History: Last updated: 2009/02/25 Married.  Lives in [place] with spouse.  Unemployed.  Previous Engineer, mining.  Never Smoked Alcohol use-no Drug use-no  Risk Factors: Exercise: no (03/22/2007)  Risk Factors: Smoking Status: never (05/29/2010)  Past medical, surgical, family and social histories (including risk factors) reviewed, and no changes noted (except as noted below).  Past Medical History: Reviewed history from 02-25-09 and no changes required. HIATAL HERNIA (ICD-553.3) ESOPHAGEAL STRICTURE (ICD-530.3) LIVER FUNCTION TESTS, ABNORMAL (ICD-794.8)- Presumed fatty liver disease FASCIITIS, PLANTAR (ICD-728.71) OTALGIA (ICD-388.70) GERD (ICD-530.81) SLEEP APNEA (ICD-780.57) DEPRESSION (ICD-311) HYPERLIPIDEMIA (ICD-272.4) DIABETES MELLITUS, TYPE II (ICD-250.00) HYPERTENSION (ICD-401.9)  Past Surgical History: Reviewed history from 25-Feb-2009 and no changes required. APPENDECTOMY HYSTERECTOMY FOR ENDOMETREOSIS BENIGN CYST IN LUNG REMOVED BY DR Edwyna Shell  Family History: Reviewed history from 2009/02/25 and no changes required. father   CHF mother died of BRCA at age 62  Social History: Reviewed history from 2009-02-25 and no changes required. Married.  Lives in [place] with spouse.  Unemployed.  Previous Engineer, mining.  Never Smoked Alcohol use-no Drug use-no  Review of Systems       The patient complains of weight loss.  The patient denies anorexia, fever, weight gain, vision loss, decreased hearing, hoarseness, chest pain, syncope, dyspnea on  exertion, peripheral edema, prolonged cough, headaches, hemoptysis, abdominal pain, melena, hematochezia, severe indigestion/heartburn, hematuria, incontinence, genital sores, muscle weakness, suspicious skin lesions, transient blindness, difficulty walking, depression, unusual weight change, abnormal bleeding, enlarged lymph nodes, angioedema, and breast masses.    Physical Exam  General:  obese AAF in mild distress Head:  Normocephalic and atraumatic without obvious abnormalities. No apparent alopecia or balding. Eyes:  No corneal or conjunctival inflammation noted. EOMI. Perrla. Funduscopic exam benign, without hemorrhages, exudates or papilledema. Vision grossly normal. Ears:  both tympanic membranes and canals were clear Nose:  no external deformity and no nasal discharge.   Mouth:  good dentition and pharynx pink and moist.   Neck:  supple and full ROM.   Lungs:  Normal respiratory effort, chest expands symmetrically. Lungs are clear to auscultation, no crackles or wheezes. Heart:  Normal rate and regular rhythm. S1 and S2 normal without gallop, murmur, click, rub or other extra sounds.   Impression & Recommendations:  Problem # 1:  TRANSAMINASES, SERUM, ELEVATED (ICD-790.4) Assessment Unchanged monitering mild elevations Orders: Venipuncture (16109) TLB-Hepatic/Liver Function Pnl (80076-HEPATIC)  Problem # 2:  HYPERSOMNIA WITH SLEEP APNEA UNSPECIFIED (ICD-780.53) Assessment: Deteriorated not using sleep apnea machine discussed use  Problem # 3:  HYPERLIPIDEMIA (ICD-272.4) Assessment: Unchanged  Her updated medication list for this problem includes:  Lipitor 10 Mg Tabs (Atorvastatin calcium) ..... Once daily  Labs Reviewed: SGOT: 48 (04/21/2010)   SGPT: 67 (04/21/2010)  Lipid Goals: Chol Goal: 200 (07/05/2008)   HDL Goal: 40 (07/05/2008)   LDL Goal: 100 (07/05/2008)   TG Goal: 150 (07/05/2008)  Prior 10 Yr Risk Heart Disease: 15 % (07/05/2008)   HDL:45.20  (08/22/2009), 40.10 (12/31/2008)  LDL:67 (08/22/2009), 89 (12/31/2008)  Chol:130 (08/22/2009), 150 (12/31/2008)  Trig:88.0 (08/22/2009), 104.0 (12/31/2008)  Problem # 4:  DIABETES MELLITUS, TYPE II (ICD-250.00) Assessment: Deteriorated  Her updated medication list for this problem includes:    Metformin Hcl 1000 Mg Tabs (Metformin hcl) .Marland Kitchen... 1 two times a day    Micardis Hct 40-12.5 Mg Tabs (Telmisartan-hctz) .Marland Kitchen... Take 1 tablet by mouth once a day  Orders: TLB-A1C / Hgb A1C (Glycohemoglobin) (83036-A1C) TLB-BMP (Basic Metabolic Panel-BMET) (80048-METABOL)  Labs Reviewed: Creat: 0.8 (01/19/2010)     Last Eye Exam: normal (05/14/2009) Reviewed HgBA1c results: 7.7 (04/21/2010)  8.2 (01/19/2010)  Complete Medication List: 1)  Metformin Hcl 1000 Mg Tabs (Metformin hcl) .Marland Kitchen.. 1 two times a day 2)  Lipitor 10 Mg Tabs (Atorvastatin calcium) .... Once daily 3)  Omeprazole 40 Mg Cpdr (Omeprazole) .Marland Kitchen.. 1 by mouth daily 4)  Freestyle Lite Strp (Glucose blood) .... Use two times a day as directed for cbg's  ( 250.82 5)  Micardis Hct 40-12.5 Mg Tabs (Telmisartan-hctz) .... Take 1 tablet by mouth once a day  Patient Instructions: 1)  1000 steps a day!!!! 2)  Please schedule a follow-up appointment in 3 months. 3)  HbgA1C prior to visit, ICD-9:250.00 Prescriptions: METFORMIN HCL 1000 MG TABS (METFORMIN HCL) 1 two times a day  #60 x 11   Entered and Authorized by:   Stacie Glaze MD   Signed by:   Stacie Glaze MD on 05/29/2010   Method used:   [facility]  [address] [phone][facility] ([facility]ail[facility]       [address] [place], [place]  [address]       Ph: [phone] or [phone]       Fax: [phone]   RxID:   [id number]    Orders Added: 1)  Venipuncture [34742] 2)  TLB-Hepatic/Liver Function Pnl [80076-HEPATIC] 3)  TLB-A1C / Hgb A1C (Glycohemoglobin) [83036-A1C] 4)  TLB-BMP (Basic Metabolic Panel-BMET) [80048-METABOL] 5)  Est. Patient Level IV [59563]

## 2010-07-16 NOTE — Letter (Signed)
Summary: Colonoscopy Letter  [facility]  [address] [place], [place] [address]   Phone: [phone]  Fax: [phone]      July 08, 2010 MRN: [id number]   [name] [address] [place], [place]  [address]   Dear [name],   According to your medical record, it is time for you to schedule a Colonoscopy. The American Cancer Society recommends this procedure as a method to detect early colon cancer. Patients with a family history of colon cancer, or a personal history of colon polyps or inflammatory bowel disease are at increased risk.  This letter has been generated based on the recommendations made at the time of your procedure. If you feel that in your particular situation this may no longer apply, please contact our office.  Please call our office at [phone] to schedule this appointment or to update your records at your earliest convenience.  Thank you for cooperating with [place] to provide you with the very best care possible.   Sincerely,  Judie Petit T. Russella Dar, M.D.  [facility] Gastroenterology Division [phone]

## 2010-08-11 ENCOUNTER — Other Ambulatory Visit: Payer: Self-pay | Admitting: Gastroenterology

## 2010-08-20 ENCOUNTER — Encounter (INDEPENDENT_AMBULATORY_CARE_PROVIDER_SITE_OTHER): Payer: Self-pay | Admitting: *Deleted

## 2010-08-25 NOTE — Letter (Signed)
Summary: Pre Visit Letter Revised  [facility]  [address] [place], [place] [address]   Phone: [phone]  Fax: [phone]        08/20/2010 MRN: [id number] [name] [address] [place], [place]  [address]             Procedure Date:  09-29-10           Recall Colon---Dr. Russella Dar   Welcome to the Gastroenterology Division at [facility].    You are scheduled to see a nurse for your pre-procedure visit on 09-15-10 at 4:30p.m. on the 3rd floor at [facility], [address]  We ask that you try to arrive at our office 15 minutes prior to your appointment time to allow for check-in.  Please take a minute to review the attached form.  If you answer "Yes" to one or more of the questions on the first page, we ask that you call the person listed at your earliest opportunity.  If you answer "No" to all of the questions, please complete the rest of the form and bring it to your appointment.    Your nurse visit will consist of discussing your medical and surgical history, your immediate family medical history, and your medications.   If you are unable to list all of your medications on the form, please bring the medication bottles to your appointment and we will list them.  We will need to be aware of both prescribed and over the counter drugs.  We will need to know exact dosage information as well.    Please be prepared to read and sign documents such as consent forms, a financial agreement, and acknowledgement forms.  If necessary, and with your consent, a friend or relative is welcome to sit-in on the nurse visit with you.  Please bring your insurance card so that we may make a copy of it.  If your insurance requires a referral to see a specialist, please bring your referral form from your primary care physician.  No co-pay is required for this nurse visit.     If you cannot keep your appointment, please call [phone] to cancel or reschedule prior to  your appointment date.  This allows [place] the opportunity to schedule an appointment for another patient in need of care.    Thank you for choosing  Gastroenterology for your medical needs.  We appreciate the opportunity to care for you.  Please visit [place] at our website  to learn more about our practice.  Sincerely, The Gastroenterology Division

## 2010-08-27 ENCOUNTER — Other Ambulatory Visit: Payer: Self-pay

## 2010-08-28 ENCOUNTER — Other Ambulatory Visit (INDEPENDENT_AMBULATORY_CARE_PROVIDER_SITE_OTHER): Payer: BC Managed Care – PPO | Admitting: Internal Medicine

## 2010-08-28 DIAGNOSIS — E119 Type 2 diabetes mellitus without complications: Secondary | ICD-10-CM

## 2010-09-02 ENCOUNTER — Encounter: Payer: Self-pay | Admitting: Internal Medicine

## 2010-09-04 ENCOUNTER — Ambulatory Visit (INDEPENDENT_AMBULATORY_CARE_PROVIDER_SITE_OTHER): Payer: BC Managed Care – PPO | Admitting: Internal Medicine

## 2010-09-04 ENCOUNTER — Encounter: Payer: Self-pay | Admitting: Internal Medicine

## 2010-09-04 VITALS — BP 110/80 | HR 80 | Temp 98.1°F | Resp 16 | Ht 62.5 in | Wt 208.0 lb

## 2010-09-04 DIAGNOSIS — E119 Type 2 diabetes mellitus without complications: Secondary | ICD-10-CM

## 2010-09-04 DIAGNOSIS — I10 Essential (primary) hypertension: Secondary | ICD-10-CM

## 2010-09-04 DIAGNOSIS — M545 Low back pain, unspecified: Secondary | ICD-10-CM

## 2010-09-04 NOTE — Assessment & Plan Note (Addendum)
The A1c is 8.2 and the patient admits that she has struggled with her diet at home full of nondiabetics. She has not lost weight she hoped to him realizes that diet is to keep a lot of her A1c not changing her medications at this time she wants another 3 months to try to lower the A1c however we are in agreement that if she does not compress and 3 months we will add onglyza to the metformin.  to help her with her goal of better control we will provide her with a glucometer and strips today

## 2010-09-04 NOTE — Assessment & Plan Note (Signed)
Arthritis is moderately controlled she has mild pain consistent with her mild osteoarthritis in her back pain is stable at this time

## 2010-09-04 NOTE — Assessment & Plan Note (Signed)
Her blood pressure is in excellent control she is tolerating her medications without difficulty

## 2010-09-04 NOTE — Progress Notes (Signed)
Subjective:    Patient ID: [name], female    DOB: [birth year], 60 y.o.   MRN: [id number]  HPI  patient is a 60 year old African American female who presents for followup of her diabetes she admits to dietary noncompliance and has not lost weight she states it is difficult to diet and her home because she is the only diabetic.   her blood pressure is stable she is a compliant with her medication she refuses to increase her diabetic medications at this time stating that she really needs to follow a diet and not take additional pills her cardiovascular risks are hypertension hyperlipidemia and diabetes and we explained that these 3 risks together were significant in that she could not get her A1c below 7 we had to change her medications.  We are further referral to a dietitian   Review of Systems  Constitutional: Negative for activity change, appetite change and fatigue.  HENT: Negative for ear pain, congestion, neck pain, postnasal drip and sinus pressure.   Eyes: Negative for redness and visual disturbance.  Respiratory: Negative for cough, shortness of breath and wheezing.   Gastrointestinal: Negative for abdominal pain and abdominal distention.  Genitourinary: Negative for dysuria, frequency and menstrual problem.  Musculoskeletal: Negative for myalgias, joint swelling and arthralgias.  Skin: Negative for rash and wound.  Neurological: Negative for dizziness, weakness and headaches.  Hematological: Negative for adenopathy. Does not bruise/bleed easily.  Psychiatric/Behavioral: Negative for sleep disturbance and decreased concentration.       Past Medical History  Diagnosis Date  . Diaphragmatic hernia without mention of obstruction or gangrene   . Stricture and stenosis of esophagus   . Nonspecific abnormal results of liver function study   . Plantar fascial fibromatosis   . Otalgia, unspecified   . Esophageal reflux   . Unspecified sleep apnea   . Depression   .  Hyperlipidemia   . Diabetes mellitus   . Hypertension    Past Surgical History  Procedure Date  . Appendectomy   . Abdominal hysterectomy   . Benigh cyst in lung     reports that she has never smoked. She does not have any smokeless tobacco history on file. She reports that she does not drink alcohol or use illicit drugs. family history includes Cancer in her mother and Heart disease in her father. Allergies  Allergen Reactions  . Amoxicillin     REACTION: rash  . Codeine     Objective:   Physical Exam  Constitutional: She is oriented to person, place, and time. She appears well-developed and well-nourished. No distress.  HENT:  Head: Normocephalic and atraumatic.  Right Ear: External ear normal.  Left Ear: External ear normal.  Nose: Nose normal.  Mouth/Throat: Oropharynx is clear and moist.  Eyes: Conjunctivae and EOM are normal. Pupils are equal, round, and reactive to light.  Neck: Normal range of motion. Neck supple. No JVD present. No tracheal deviation present. No thyromegaly present.  Cardiovascular: Normal rate, regular rhythm, normal heart sounds and intact distal pulses.   No murmur heard. Pulmonary/Chest: Effort normal and breath sounds normal. She has no wheezes. She exhibits no tenderness.  Abdominal: Soft. Bowel sounds are normal.  Musculoskeletal: Normal range of motion. She exhibits no edema and no tenderness.  Lymphadenopathy:    She has no cervical adenopathy.  Neurological: She is alert and oriented to person, place, and time. She has normal reflexes. No cranial nerve deficit.  Skin: Skin is warm and dry. She is not  diaphoretic.  Psychiatric: She has a normal mood and affect. Her behavior is normal.          Assessment & Plan:

## 2010-09-15 ENCOUNTER — Ambulatory Visit (AMBULATORY_SURGERY_CENTER): Payer: BC Managed Care – PPO | Admitting: *Deleted

## 2010-09-15 VITALS — Ht 62.5 in | Wt 205.0 lb

## 2010-09-15 DIAGNOSIS — Z1211 Encounter for screening for malignant neoplasm of colon: Secondary | ICD-10-CM

## 2010-09-15 MED ORDER — PEG-KCL-NACL-NASULF-NA ASC-C 100 G PO SOLR: ORAL | Status: DC

## 2010-09-24 ENCOUNTER — Encounter: Payer: Self-pay | Admitting: Internal Medicine

## 2010-09-29 ENCOUNTER — Encounter: Payer: Self-pay | Admitting: Gastroenterology

## 2010-09-29 ENCOUNTER — Ambulatory Visit (AMBULATORY_SURGERY_CENTER): Payer: BC Managed Care – PPO | Admitting: Gastroenterology

## 2010-09-29 VITALS — BP 114/69 | HR 93 | Temp 99.0°F | Resp 18 | Ht 62.0 in | Wt 205.0 lb

## 2010-09-29 DIAGNOSIS — D126 Benign neoplasm of colon, unspecified: Secondary | ICD-10-CM

## 2010-09-29 DIAGNOSIS — Z86010 Personal history of colonic polyps: Secondary | ICD-10-CM

## 2010-09-29 DIAGNOSIS — Z1211 Encounter for screening for malignant neoplasm of colon: Secondary | ICD-10-CM

## 2010-09-29 DIAGNOSIS — K620 Anal polyp: Secondary | ICD-10-CM

## 2010-09-29 LAB — GLUCOSE, CAPILLARY
Glucose-Capillary: 123 mg/dL — ABNORMAL HIGH (ref 70–99)
Glucose-Capillary: 142 mg/dL — ABNORMAL HIGH (ref 70–99)

## 2010-09-29 MED ORDER — SODIUM CHLORIDE 0.9 % IV SOLN: 500.0000 mL | INTRAVENOUS | Status: DC

## 2010-09-29 NOTE — Patient Instructions (Signed)
Discharged instructions given with verbal understanding. Handout on Diverticulosis given. Resume previous medications. 

## 2010-09-30 ENCOUNTER — Telehealth: Payer: Self-pay | Admitting: *Deleted

## 2010-09-30 NOTE — Telephone Encounter (Signed)

## 2010-10-03 ENCOUNTER — Emergency Department (HOSPITAL_COMMUNITY): Payer: BC Managed Care – PPO

## 2010-10-03 ENCOUNTER — Emergency Department (HOSPITAL_COMMUNITY)
Admission: EM | Admit: 2010-10-03 | Discharge: 2010-10-03 | Disposition: A | Discharge status: Home / Self Care | Payer: BC Managed Care – PPO | Attending: Emergency Medicine | Admitting: Emergency Medicine

## 2010-10-03 DIAGNOSIS — R059 Cough, unspecified: Secondary | ICD-10-CM | POA: Insufficient documentation

## 2010-10-03 DIAGNOSIS — E789 Disorder of lipoprotein metabolism, unspecified: Secondary | ICD-10-CM | POA: Insufficient documentation

## 2010-10-03 DIAGNOSIS — R110 Nausea: Secondary | ICD-10-CM | POA: Insufficient documentation

## 2010-10-03 DIAGNOSIS — E119 Type 2 diabetes mellitus without complications: Secondary | ICD-10-CM | POA: Insufficient documentation

## 2010-10-03 DIAGNOSIS — K219 Gastro-esophageal reflux disease without esophagitis: Secondary | ICD-10-CM | POA: Insufficient documentation

## 2010-10-03 DIAGNOSIS — R0602 Shortness of breath: Secondary | ICD-10-CM | POA: Insufficient documentation

## 2010-10-03 DIAGNOSIS — R5381 Other malaise: Secondary | ICD-10-CM | POA: Insufficient documentation

## 2010-10-03 DIAGNOSIS — I10 Essential (primary) hypertension: Secondary | ICD-10-CM | POA: Insufficient documentation

## 2010-10-03 DIAGNOSIS — R0609 Other forms of dyspnea: Secondary | ICD-10-CM | POA: Insufficient documentation

## 2010-10-03 DIAGNOSIS — R05 Cough: Secondary | ICD-10-CM | POA: Insufficient documentation

## 2010-10-03 DIAGNOSIS — R5383 Other fatigue: Secondary | ICD-10-CM | POA: Insufficient documentation

## 2010-10-03 DIAGNOSIS — Z79899 Other long term (current) drug therapy: Secondary | ICD-10-CM | POA: Insufficient documentation

## 2010-10-03 DIAGNOSIS — R0989 Other specified symptoms and signs involving the circulatory and respiratory systems: Secondary | ICD-10-CM | POA: Insufficient documentation

## 2010-10-03 LAB — COMPREHENSIVE METABOLIC PANEL
ALT: 55 U/L — ABNORMAL HIGH (ref 0–35)
AST: 34 U/L (ref 0–37)
Albumin: 3.6 g/dL (ref 3.5–5.2)
CO2: 25 mEq/L (ref 19–32)
Calcium: 9 mg/dL (ref 8.4–10.5)
Chloride: 106 mEq/L (ref 96–112)
GFR calc Af Amer: >60 mL/min (ref >60)
GFR calc non Af Amer: >60 mL/min (ref >60)
Sodium: 139 mEq/L (ref 135–145)
Total Bilirubin: 0.5 mg/dL (ref 0.3–1.2)

## 2010-10-03 LAB — URINALYSIS, ROUTINE W REFLEX MICROSCOPIC
Bilirubin Urine: NEGATIVE
Glucose, UA: NEGATIVE mg/dL
Hgb urine dipstick: NEGATIVE
Ketones, ur: NEGATIVE mg/dL
pH: 5 (ref 5.0–8.0)

## 2010-10-03 LAB — CBC
HCT: 37.4 % (ref 36.0–46.0)
MCHC: 34 g/dL (ref 30.0–36.0)
MCV: 79.4 fL (ref 78.0–100.0)
RDW: 12.9 % (ref 11.5–15.5)

## 2010-10-03 LAB — DIFFERENTIAL
Eosinophils Relative: 1 % (ref 0–5)
Lymphocytes Relative: 38 % (ref 12–46)
Lymphs Abs: 2.9 10*3/uL (ref 0.7–4.0)
Monocytes Absolute: 0.5 10*3/uL (ref 0.1–1.0)
Monocytes Relative: 7 % (ref 3–12)

## 2010-10-03 LAB — D-DIMER, QUANTITATIVE: D-Dimer, Quant: 0.23 ug/mL-FEU (ref 0.00–0.48)

## 2010-10-03 LAB — GLUCOSE, CAPILLARY: Glucose-Capillary: 103 mg/dL — ABNORMAL HIGH (ref 70–99)

## 2010-10-05 ENCOUNTER — Encounter: Payer: Self-pay | Admitting: Gastroenterology

## 2010-10-12 ENCOUNTER — Other Ambulatory Visit: Payer: Self-pay | Admitting: Internal Medicine

## 2010-10-12 NOTE — Telephone Encounter (Signed)
Pt would like samples of liptor 10mg  waiting on PA

## 2010-10-12 NOTE — Telephone Encounter (Signed)
Gave her a      lipitor 4 dollar card and new script- pt informed

## 2010-10-14 ENCOUNTER — Other Ambulatory Visit: Payer: Self-pay | Admitting: Internal Medicine

## 2010-10-27 NOTE — Letter (Signed)
March 27, 2008   Stacie Glaze, MD  [address] [place]  [place] [address]   Re:  [name], [name]             DOB:  11/03/1950   Dear Dr. Lovell Sheehan:   I saw the patient back today after a year as you know in 2006, we did a  resection of a bronchogenic cyst and there was a question of recurrence  last year.  I thought that was just normal postoperative changes, so we  waited another year and repeated her CT scan, I really do not see  anything there other than just scarring, so from my standpoint, I do not  think there is any recurrence of this and there is just a postoperative  area.  I will refer her back to you for long-term followup.  I do not  think that I need to see her any more.  Her blood pressure is 121/77,  pulse 68, respirations 18, sats were 98%.  Lungs are clear to  auscultation and percussion.   Ines Bloomer, M.D.  Electronically Signed   DPB/MEDQ  D:  03/27/2008  T:  03/28/2008  Job:  [id number]

## 2010-10-27 NOTE — Assessment & Plan Note (Signed)
[facility]                         GASTROENTEROLOGY OFFICE NOTE   [name], WOLLEN                    MRN:          [id number]  DATE:04/25/2007                            DOB:          Dec 12, 1950    This is a 60 year old African-American female that I have seen in the  past for gastroesophageal reflux disease and esophageal strictures.  Her  last dilation was in November of 2005 and she had a very good response  to that dilation.  She states she takes Protonix intermittently.  She  reports intermittent difficulty with solid food dysphagia over the past  several months.  She notes no heartburn, indigestion or belching.  She  does note after she has an episode of dysphagia, she will often have  hiccups.  She underwent a CT scan of the chest on March 31, 2007 which  showed a slight thickening of the distal esophageal wall and fatty  infiltration of the liver.  She relates no weight loss, odynophagia,  abdominal pain, change in bowel habits, melena or hematochezia.   CURRENT MEDICATIONS:  Listed on the chart, updated and reviewed.   MEDICATION ALLERGIES:  CODEINE.   PHYSICAL EXAMINATION:  No acute distress.  Weight 214.2 pounds.  Blood pressure is 102/70, pulse 80 and regular.  CHEST:  Clear to auscultation bilaterally.  CARDIAC:  Regular rate and rhythm without murmurs.  ABDOMEN:  Soft and nontender with normoactive bowel sounds.  No palpable  organomegaly, masses or hernias.   ASSESSMENT AND PLAN:  Gastroesophageal reflux disease with a history of  a hiatal hernia and a peptic stricture.  She has recurrent solid food  dysphagia as well as mild esophageal wall thickening on a recent CT  scan.  The thickening is likely related to gastroesophageal reflux  disease and her stricture.  Need to exclude the less likely possibility  of a neoplastic or infiltrative esophageal lesion.  I have advised that  she needs to maintain a proton pump  inhibitor long term for adequate  management of her gastroesophageal reflux disease, and to help prevent  future, recurrent strictures.  Begin omeprazole 40 mg p.o. q.a.m. taken  30 minutes before breakfast.  Maintain long-term antireflux measures.  Risks, benefits, and alternatives to endoscopy with possible biopsy or  possible dilation discussed with the patient.  She consents to proceed.  This will be scheduled electively.     Venita Lick. Russella Dar, MD, [facility]  Electronically Signed    MTS/MedQ  DD: 04/25/2007  DT: 04/25/2007  Job #: [id number]   cc:   Stacie Glaze, MD

## 2010-10-27 NOTE — Letter (Signed)
April 13, 2007   Stacie Glaze, MD  [address] [place], [place]  [address]   Re:  [name], [name]             DOB:  [birth year]   Dear Dr. Lovell Sheehan:   I saw the patient back in the office today.  She had a repeat CT scan  that they questioned a recurrent bronchogenic cyst.  I reviewed the scan  and compared it to one in 2006 as well as 6 and I really do not think  there is much change.  There is thickening in that area, but that really  goes along with postoperative changes from her original bronchogenic  cyst.  Her only other problem is a keloid formation on her scar.  She is  having no symptoms.  I referred her to plastic surgeon for evaluation of  her keloids.  I will see her again in one year and repeat her CT scan  just to make sure about everything.  I appreciate the opportunity of  seeing this patient.  Her blood pressure was 118/79, pulse 89,  respirations 18, and saturations were 96%.   Ines Bloomer, M.D.  Electronically Signed   DPB/MEDQ  D:  04/13/2007  T:  04/14/2007  Job:  [id number]

## 2010-10-30 NOTE — Discharge Summary (Signed)
NAMEMARSHIA, [name]             ACCOUNT NO.:  [id number]   MEDICAL RECORD NO.:  [id number]          PATIENT TYPE:  INP   LOCATION:  6731                         FACILITY:  [facility]   PHYSICIAN:  Gordy Savers, M.D. LHCDATE OF BIRTH:  20-Sep-1950   DATE OF ADMISSION:  10/29/2004  DATE OF DISCHARGE:  11/01/2004                                 DISCHARGE SUMMARY   REFERRING PHYSICIAN:  Lorre Nick, M.D.   FINAL DIAGNOSES:  1.  Intractable nausea and vomiting.  2.  Elevated liver function studies.  3.  Diabetes.  4.  Hypertension.   HISTORY OF PRESENT ILLNESS:  The patient is a 60 year old black female who  presented with a three-day history of intractable nausea and vomiting.  She  also described epigastric pain as well as pain in the right upper quadrant.  There was some associated low grade fever and chills.  Patient recently had  had a hospital discharge for removal of a bronchial cyst.   LABORATORY STUDIES AND HOSPITAL COURSE:  The patient was admitted to the  hospital where she was supported with IV fluids and treated symptomatically.  Laboratory revealed slight elevation and transaminases.  CT of the abdomen  and pelvis revealed some diffuse fatty liver.  The gallbladder was normal in  the hospital.  Blood sugars were monitored and remained under nice control.  Evaluation included a urine culture that revealed multiple species.  Acute  hepatitis serology was negative.   Patient's hospital course was marked by steady improvement. At the time of  discharge, she was pain-free.  Hungry without nausea.  She was tolerating  her diet without difficulty.   DISPOSITION:  The patient will be discharged today to resume on her  preadmission medications.  Patient is asked to follow up with primary care  [name] within the next four to five days.      PFK/MEDQ  D:  11/01/2004  T:  11/01/2004  Job:  [id number]   cc:   Lorre Nick, M.D.  [address] [place], [place] [address]  Fax: [phone]

## 2010-10-30 NOTE — Discharge Summary (Signed)
[name], [name]             ACCOUNT NO.:  [id number]   MEDICAL RECORD NO.:  [id number]          PATIENT TYPE:  INP   LOCATION:  2019                         FACILITY:  [facility]   PHYSICIAN:  Ines Bloomer, M.D. DATE OF BIRTH:  14-Feb-1951   DATE OF ADMISSION:  10/09/2004  DATE OF DISCHARGE:  10/12/2004                                 DISCHARGE SUMMARY   ADMISSION DIAGNOSIS:  Bronchogenic cyst.   DISCHARGE DIAGNOSIS:  1.  Hypertension.  2.  Hypercholesterolemia.  3.  Mild obesity.  4.  Reflux.  5.  Diabetes mellitus type 2.  6.  History of dysphagia status post esophageal dilatation.  7.  Bronchogenic cyst status post right videoscopic thoracoscopic assisted      surgery, right thoracotomy, and resection of bronchogenic cyst.   ALLERGIES:  Codeine which causes nausea and vomiting.   BRIEF HISTORY:  The patient is a 60 year old female with a history of  dysphagia who underwent a workup by Dr. Russella Dar and had an esophageal  dilatation.  Subsequent esophagogram revealed extensive compression in the  subcarinal area.  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  The patient continued to have dysphagia despite dilatation.  The  patient had no cardiac arrhythmias, fever, or chills at the time.  The  patient was referred to Dr. Edwyna Shell of CVTS service for consideration of  resection of this cyst to alleviate her dysphagia.  A follow up CT scan  revealed the bronchogenic cyst had increased in size from 60 mm by 40 mm to  59 mm by 43 mm.  Secondary to this, Dr. Edwyna Shell recommended resection of the  cyst and the patient agreed to proceed.   HOSPITAL COURSE:  The patient was admitted and taken to the OR on October 09, 2004, for right thoracotomy, right video assisted thoracoscopic surgery, and  resection of bronchogenic cyst.  The patient tolerated the procedure well  and was  hemodynamically stable immediately postoperatively.  The patient was  transferred from the OR to the PACU in stable condition.  The patient was  extubated without complications and woke up from anesthesia neurologically  intact.   The patient's postoperative course progressed as expected.  Her chest tubes  and invasive lines were discontinued in a routine manner without  complications.  She did not experience any difficulty with pneumothoraces or  air leak.  The patient is ambulating well.  On postoperative day four, the  patient is doing well.  Her wounds are healing well and her chest x-ray is  stable.  Her labs are also stable and as long as she continues to progress  in the current manner, will be ready for discharge within the next 1-2 days  pending morning round re-evaluation and follow up chest x-ray.   LABORATORY DATA:  CBC on October 11, 2004, showed hemoglobin 11.1, hematocrit  32.4, white count 8.2, platelets 200.  BMP on October 11, 2004, sodium 135,  potassium 3.8, BUN 3, creatinine 0.9, glucose 163.  CONDITION ON DISCHARGE:  Improved.   DISCHARGE MEDICATIONS:  1.  Protonix 40 mg daily.  2.  Lipitor 10 mg daily.  3.  Metformin 500 mg b.i.d.  4.  Benicar 12.5 mg daily.  5.  Tylox 1-2 p.o. q.4-6h. p.r.n. pain   DISCHARGE INSTRUCTIONS:  Activities:  No driving and the patient is to  continue daily breathing and walking exercises.  Diet:  Low salt, low fat,  and diabetic appropriate.  Wound care:  The patient will clean the site  daily with soap and water.  If wound problems arise, she should  contact the  CVTS office at [phone].  Follow up:  [facility] for follow up chest  x-ray on Oct 26, 2004, at 3 p.m.  Dr. Edwyna Shell Oct 27, 2004, at 4 p.m.      AY/MEDQ  D:  10/12/2004  T:  10/12/2004  Job:  [id number]

## 2010-10-30 NOTE — H&P (Signed)
NAMEDECLYNN, LOPRESTI             ACCOUNT NO.:  [id number]   MEDICAL RECORD NO.:  [id number]          PATIENT TYPE:  INP   LOCATION:  NA                           FACILITY:  [facility]   PHYSICIAN:  Ines Bloomer, M.D. DATE OF BIRTH:  June 05, 1951   DATE OF ADMISSION:  DATE OF DISCHARGE:                                HISTORY & PHYSICAL   CHIEF COMPLAINT:  Chest pain.   HISTORY OF PRESENT ILLNESS:  This 60 year old African American female has a  history of dysphagia and underwent a workup by Dr. Russella Dar and had esophageal  dilatation and an esophagram which revealed extensive compression in the  subcarinal area. An MRI and CT scan also showed a bronchogenic or esophageal  duplication cyst that was at least 7 cm in diameter and is causing her  dysphagia. She has had no fever, chills, or arrhythmias. A follow-up CT scan  was done and it still showed that the CT scan was the same size, if not  possibly around 7-8 cm in size. She is being admitted for VATS resection of  this.   PAST MEDICAL HISTORY:  1.  Hypertension.  2.  Hypercholesterolemia.  3.  Treated for reflux.  4.  Type 2 diabetes.   MEDICATIONS:  1.  Protonix 40 mg daily.  2.  Benicar one tablet daily.  3.  Lipitor 10 mg daily.  4.  Metformin two daily.  5.  She occasionally takes Allegra.   FAMILY HISTORY:  Positive for vascular disease and her father had cardiac  disease.   SOCIAL HISTORY:  She has two children. She is married. She works for a  [employer], [employer]. She does not smoke or smoke.   REVIEW OF SYSTEMS:  She is 210 pounds, she is 5 feet 2 inches. She gets  shortness of breath with exertion and mild chest tightness. CARDIAC: No  angina or arrhythmias. PULMONARY: As in history of present illness. She has  had no hemoptysis, asthma, fever, chills, or productive cough. GI: Also in  the history of present illness. GU: She denies frequent urinations or kidney  stones. VASCULAR: No claudications, TIAs, or DVTs.  ORTHOPEDICS: She has some  mild hip and joint pain. NEUROLOGIC: She has had no blackouts, headaches, or  seizures. PSYCHIATRIC: Negative. HEENT: No change in her eyesight or  hearing. No problems with anemia.   PHYSICAL EXAMINATION:  GENERAL: She is a well-developed, African American  female in no acute distress.  VITAL SIGNS: Blood pressure 130/78, pulse 79, respirations 18, saturations  97%.  HEENT: Head is atraumatic. Pupils equal, round, and reactive to light and  accommodation. TMs are intact. Nose reveals no septal deviation. Throat is  without lesions.  NECK: Supple with no thyromegaly. No carotid bruits.  CHEST: Clear to auscultation and percussion.  HEART: Regular sinus rhythm.  ABDOMEN: Soft. There is no hepatosplenomegaly. Bowel sounds are normal.  EXTREMITIES: Pulses are 2+. There is no clubbing or edema.  NEUROLOGIC: She is oriented times three. Cranial nerves are intact. Sensory  and motor are intact. DT and reflexes are 2+.  SKIN: Without lesions.   IMPRESSION:  1.  Bronchogenic cyst with dysphagia.  2.  Esophageal reflux.  3.  Type 2 diabetes.  4.  Hypertension.  5.  Hypercholesterolemia   PLAN:  Resection of bronchogenic cyst.      DPB/MEDQ  D:  10/08/2004  T:  10/08/2004  Job:  [id number]

## 2010-10-30 NOTE — Op Note (Signed)
[name], [name]             ACCOUNT NO.:  [id number]   MEDICAL RECORD NO.:  [id number]          PATIENT TYPE:  AMB   LOCATION:  ENDO                         FACILITY:  [facility]   PHYSICIAN:  Malcolm T. Russella Dar, M.D. [facility] OF BIRTH:  1951/06/12   DATE OF PROCEDURE:  02/18/2004  DATE OF DISCHARGE:  02/18/2004                                 OPERATIVE REPORT   PROCEDURE:  Esophageal manometry.   INDICATIONS:  A 60 year old female with dysphagia, regurgitation, heartburn,  and nausea.   Upper esophageal sphincter has a slightly elevated peak pressure, otherwise  normal pressures and normal relaxation.   Upper esophageal body has normal pressures and a normal tracing.   Lower esophageal body has normal pressures but markedly abnormal peristaltic  activity.  There is 20% peristaltic contractions, 10% simultaneous  contractions, 30% nontransmitted contractions, and 40% retrograde  contractions as read by the computer software and review of the tracing.  There does not appear to be any effective peristalsis in the distal  esophagus, although there are nontransmitted and simultaneous contractions  clearly noted.   Lower esophageal sphincter has an elevated resting pressure at 85.2 mmHg  with the normal upper limit of 45 mmHg.  The residual pressure is elevated  at 11.7 mmHg with the normal less than 8 mmHg.  There is a 79% relaxation  noted.   IMPRESSION:  Nonspecific manometry study with findings concerning for  vigorous achalasia given the poor lower esophageal peristaltic activity and  elevated resting pressure of the lower esophageal sphincter.   PLAN:  1.  Schedule a barium esophagogram to further evaluate.  2.  Return office visit following the barium esophagogram.  3.  Consider Botox injection for possible achalasia or hypertensive lower      esophageal sphincter.       MTS/MEDQ  D:  03/23/2004  T:  03/23/2004  Job:  [id number]   cc:   Stacie Glaze, M.D. [facility]

## 2010-10-30 NOTE — Op Note (Signed)
NAMELIANETTE, [name]             ACCOUNT NO.:  [id number]   MEDICAL RECORD NO.:  [id number]          PATIENT TYPE:  INP   LOCATION:  3316                         FACILITY:  [facility]   PHYSICIAN:  Ines Bloomer, M.D. DATE OF BIRTH:  January 18, 1951   DATE OF PROCEDURE:  10/09/2004  DATE OF DISCHARGE:                                 OPERATIVE REPORT   PREOPERATIVE DIAGNOSIS:  Bronchogenic cyst.   POSTOPERATIVE DIAGNOSIS:  Bronchogenic cyst.   OPERATION PERFORMED:  Right video assisted thoracoscopic surgery with right  thoracotomy and resection of bronchogenic cyst.   SURGEON:  Ines Bloomer, M.D.   FIRST ASSISTANT:  Regenia Skeeter, RNFA   After general anesthesia, the patient was prepped and draped in the usual  sterile manner and turned to the right lateral thoracotomy position.  A dual  lumen tube was inserted.  Two trocar sites were made in the anterior and  posterior axillary line at the seventh intercostal space.  The 0 degree  scope was inserted and the lesion could be seen pushing the esophagus up at  the carina.  The esophagus was markedly displaced to the left.  A  posterolateral thoracotomy was then done taking pictures and the latissimus  was divided with electrocautery.  The fifth intercostal space was entered  and a portion of the sixth rib was taken subperiosteally at the angle.  Two  Tuffier's were placed at right angles.  Then, dissection was started on the  esophagus using the Harmonic scalpel and the cyst was dissected free from  the esophagus reflecting the esophagus laterally and making sure we did not  injure the esophagus or the vagus nerves.  Then, going inferiorly,  dissection was started with the Harmonic scalpel dissecting superiorly and  then we dissected medially and the cyst was markedly adherent to the left  atrium.  It had to be carefully dissected off the left atrium and the  pericardium with sharp scissors, otherwise, we used the Harmonic scalpel  to  divide any other tissue.  This was dissected up and attention was turned  superiorly and it had been dissected off the right main stem bronchus and  dissected off the carina.  We then started dissecting down the left main  stem bronchus, making sure we did not injure the left main stem bronchus,  again, using sharp dissection as well as the Harmonic scalpel and limited  use of the electrocautery.  When we finally got down to what I thought was  where it came off which was in the left main stem bronchus, the origin was  clipped with Liga clips several times and the cyst was removed.  As  mentioned, we decompressed the cyst in order to make the dissection easier,  had a very gelatinous material in there which was sent for culture.  After  the cyst had been removed, the area was copiously irrigated, no bleeding was  seen.  The camera was reinserted through the trocar site and then the On-Q  catheter was placed below the pleura at the seventh intercostal space and  tying it up to the fourth  intercostal space posteriorly to the incision.  It  was then sutured into place with 3-0 silk.  We used the tunneler with the  peel away sheath for placement of the catheter.  After this had  been done, two chest tubes were inserted and tied in place with 0 silk.  The  chest was closed with four #2 Vicryl pericostals, #1 Vicryl in the muscle  layer, 2-0 Vicryl in the subcutaneous tissue, and Ethicon skin clips.  The  patient returned to the recovery room in stable condition.      DPB/MEDQ  D:  10/09/2004  T:  10/09/2004  Job:  [id number]   cc:   Venita Lick. Russella Dar, M.D. [facility]

## 2010-12-11 ENCOUNTER — Ambulatory Visit: Payer: BC Managed Care – PPO | Admitting: Internal Medicine

## 2010-12-18 ENCOUNTER — Ambulatory Visit (INDEPENDENT_AMBULATORY_CARE_PROVIDER_SITE_OTHER): Payer: BC Managed Care – PPO | Admitting: Internal Medicine

## 2010-12-18 ENCOUNTER — Encounter: Payer: Self-pay | Admitting: Internal Medicine

## 2010-12-18 VITALS — BP 130/74 | HR 92 | Temp 98.2°F | Resp 16 | Ht 62.0 in | Wt 206.0 lb

## 2010-12-18 DIAGNOSIS — R7401 Elevation of levels of liver transaminase levels: Secondary | ICD-10-CM

## 2010-12-18 DIAGNOSIS — I10 Essential (primary) hypertension: Secondary | ICD-10-CM

## 2010-12-18 DIAGNOSIS — E119 Type 2 diabetes mellitus without complications: Secondary | ICD-10-CM

## 2010-12-18 LAB — HEPATIC FUNCTION PANEL
ALT: 53 U/L — ABNORMAL HIGH (ref 0–35)
Albumin: 3.9 g/dL (ref 3.5–5.2)
Alkaline Phosphatase: 78 U/L (ref 39–117)
Total Protein: 6.9 g/dL (ref 6.0–8.3)

## 2010-12-18 LAB — BASIC METABOLIC PANEL
CO2: 26 mEq/L (ref 19–32)
Calcium: 9.4 mg/dL (ref 8.4–10.5)
Chloride: 101 mEq/L (ref 96–112)
Glucose, Bld: 278 mg/dL — ABNORMAL HIGH (ref 70–99)
Sodium: 138 mEq/L (ref 135–145)

## 2010-12-18 NOTE — Progress Notes (Signed)
Subjective:    Patient ID: [name], female    DOB: [birth year], 60 y.o.   MRN: [id number]  HPI She was seen in the emergency room and ruled out for a heart attack acutely she is here for followup of her diabetes hypertension hyperlipidemia Last stress test was several years ago.  A1c has been elevated at 8.  She had a colonoscopy which showed polyps that were benign blood pressure is well controlled She has no recurrent chest pain and no recurrent fullness in the neck.  The presentation which resulted in the emergency room evaluation was abdominal bloating    Review of Systems  Constitutional: Negative for activity change, appetite change and fatigue.  HENT: Negative for ear pain, congestion, neck pain, postnasal drip and sinus pressure.   Eyes: Negative for redness and visual disturbance.  Respiratory: Negative for cough, shortness of breath and wheezing.   Gastrointestinal: Positive for abdominal pain and abdominal distention.  Genitourinary: Negative for dysuria, frequency and menstrual problem.  Musculoskeletal: Negative for myalgias, joint swelling and arthralgias.  Skin: Negative for rash and wound.  Neurological: Negative for dizziness, weakness and headaches.  Hematological: Negative for adenopathy. Does not bruise/bleed easily.  Psychiatric/Behavioral: Negative for sleep disturbance and decreased concentration.       Past Medical History  Diagnosis Date  . Diaphragmatic hernia without mention of obstruction or gangrene   . Stricture and stenosis of esophagus   . Nonspecific abnormal results of liver function study   . Plantar fascial fibromatosis   . Otalgia, unspecified   . Esophageal reflux   . Unspecified sleep apnea   . Depression   . Hyperlipidemia   . Diabetes mellitus   . Hypertension   . Allergy     seasonal  . Arthritis     knee  . Asthma    Past Surgical History  Procedure Date  . Appendectomy   . Abdominal hysterectomy   . Benigh  cyst in lung   . Colonoscopy   . Polypectomy     hyperplastic  . Upper gastrointestinal endoscopy     reports that she has never smoked. She has never used smokeless tobacco. She reports that she does not drink alcohol or use illicit drugs. family history includes Breast cancer in her mother; Cancer in her mother; and Heart disease in her father. Allergies  Allergen Reactions  . Amoxicillin     REACTION: rash  . Codeine     Objective:   Physical Exam  Constitutional: She is oriented to person, place, and time. She appears well-developed and well-nourished. No distress.  HENT:  Head: Normocephalic and atraumatic.  Right Ear: External ear normal.  Left Ear: External ear normal.  Nose: Nose normal.  Mouth/Throat: Oropharynx is clear and moist.  Eyes: Conjunctivae and EOM are normal. Pupils are equal, round, and reactive to light.  Neck: Normal range of motion. Neck supple. No JVD present. No tracheal deviation present. No thyromegaly present.  Cardiovascular: Normal rate, regular rhythm, normal heart sounds and intact distal pulses.   No murmur heard. Pulmonary/Chest: Effort normal and breath sounds normal. She has no wheezes. She exhibits no tenderness.  Abdominal: Soft. Bowel sounds are normal.  Musculoskeletal: Normal range of motion. She exhibits no edema and no tenderness.  Lymphadenopathy:    She has no cervical adenopathy.  Neurological: She is alert and oriented to person, place, and time. She has normal reflexes. No cranial nerve deficit.  Skin: Skin is warm and dry. She is not  diaphoretic.  Psychiatric: She has a normal mood and affect. Her behavior is normal.          Assessment & Plan:  The results of the emergency room visit were reviewed and no indication for further cardiac testing is present at this time.  Uncontrolled diabetes as needed we're hopeful that the A1c will be improved we are looking for a goal A1c less than 7 Blood pressure is well-controlled  currently

## 2010-12-18 NOTE — Assessment & Plan Note (Signed)
Patient presents for followup of her diabetes.  Her blood sugars have fluctuated in value Postprandial blood values of 140-150

## 2010-12-18 NOTE — Assessment & Plan Note (Signed)
Blood pressure stable on Micardis

## 2010-12-18 NOTE — Patient Instructions (Signed)
Diabetes and Exercise Regular exercise is important and can help:   Control blood glucose (sugar).   Decrease blood pressure.   Control blood lipids (cholesterol and triglycerides).   Improve overall health.  BENEFITS FROM EXERCISE:  Improved fitness.   Improved flexibility.   Improved endurance.   Increased bone density.   Weight control.   Increased muscle strength.   Decreased body fat.   Improvement of the body's use of a hormone called insulin.   Increased insulin sensitivity.   Reduction of insulin needs.   Helps you feel better.   Reduces stress and tension.  People with diabetes who add exercise to their lifestyle gain additional benefits.   Weight loss.   Reduces appetite.   Improves body's use of blood glucose (sugar).   Decreases risk factors for heart disease:   Lowering of cholesterol and triglycerides.   Raising the level of good cholesterol (high-density lipoproteins [HDL]).   Lowering blood sugar.   Decreases blood pressure.  TYPE 1 DIABETES AND EXERCISE  Exercise will usually lower your blood glucose.   If blood glucose is greater than 240 mg/dl, check urine ketones. If ketones are present, do not exercise.   Location of the insulin injection sites may need to be adjusted with exercise. Avoid injecting insulin into areas of the body that will be exercised. For example, avoid injecting insulin into:   The arms when playing tennis.   The legs when jogging. For more information, discuss this with your caregiver.   Keep a record of:   Food intake.   Type and amount of exercise.   Expected peak times of insulin action.   Blood glucose (sugar) levels.  Do this before, during and after exercise. Review your records with your caregiver(s). This will help you to develop guidelines for adjusting food intake and/or insulin amounts.  TYPE 2 DIABETES AND EXERCISE  Regular physical activity can help control blood glucose.   Exercise is  important because it may:   Increase the body's sensitivity to insulin.   Improve blood glucose control.   Exercise reduces the risk of heart disease. It decreases serum cholesterol and triglycerides. It also lowers blood pressure.   Those who take insulin or oral hypoglycemic agents should watch for signs of hypoglycemia. These signs include dizziness, shaking, sweating, chills and confusion.   Body water is lost during exercise. It must be replaced. This will help to avoid loss of body fluids (dehydration) and/or heat stroke.  Be sure to talk to your caregiver before starting an exercise program to make sure it is safe for you. Remember, any activity is better than none.  Document Released: 08/21/2003 Document Re-Released: 03/28/2009 [employer] Patient Information 2011 [employer]. 

## 2010-12-18 NOTE — Assessment & Plan Note (Signed)
Monitoring of liver function today

## 2010-12-22 ENCOUNTER — Other Ambulatory Visit: Payer: Self-pay | Admitting: Internal Medicine

## 2011-03-22 ENCOUNTER — Encounter: Payer: Self-pay | Admitting: Internal Medicine

## 2011-03-22 ENCOUNTER — Ambulatory Visit (INDEPENDENT_AMBULATORY_CARE_PROVIDER_SITE_OTHER): Payer: BC Managed Care – PPO | Admitting: Internal Medicine

## 2011-03-22 VITALS — BP 126/80 | HR 72 | Temp 98.2°F | Resp 16 | Ht 62.5 in | Wt 206.0 lb

## 2011-03-22 DIAGNOSIS — I10 Essential (primary) hypertension: Secondary | ICD-10-CM

## 2011-03-22 DIAGNOSIS — J309 Allergic rhinitis, unspecified: Secondary | ICD-10-CM

## 2011-03-22 DIAGNOSIS — Z23 Encounter for immunization: Secondary | ICD-10-CM

## 2011-03-22 DIAGNOSIS — K219 Gastro-esophageal reflux disease without esophagitis: Secondary | ICD-10-CM

## 2011-03-22 DIAGNOSIS — E119 Type 2 diabetes mellitus without complications: Secondary | ICD-10-CM

## 2011-03-22 LAB — BASIC METABOLIC PANEL
BUN: 11 mg/dL (ref 6–23)
Calcium: 9.3 mg/dL (ref 8.4–10.5)
Chloride: 102 mEq/L (ref 96–112)
Creatinine, Ser: 0.8 mg/dL (ref 0.4–1.2)
GFR: 91.91 mL/min (ref >60.00)

## 2011-03-22 LAB — HEMOGLOBIN A1C: Hgb A1c MFr Bld: 8.2 % — ABNORMAL HIGH (ref 4.6–6.5)

## 2011-03-22 MED ORDER — ESOMEPRAZOLE MAGNESIUM 40 MG PO CPDR: 40.0000 mg | DELAYED_RELEASE_CAPSULE | Freq: Every day | ORAL | Status: DC

## 2011-03-22 NOTE — Progress Notes (Signed)
  Subjective:    Patient ID: [name], female    DOB: 1950/07/31, 60 y.o.   MRN: [id number]  HPI Patient is a 60 year old female who presents for followup of adult onset diabetes hypertension and hyperlipidemia.  She is acute complaint of sinus congestion that has been worse over the last 3-4 weeks.  She has no fever no chills but has sore throat postnasal drip congestion. And has developed a hacking cough Today we'll follow  Up GERD with nexium which seems to be improved   Review of Systems  Constitutional: Positive for fatigue. Negative for activity change and appetite change.  HENT: Positive for rhinorrhea, sneezing, postnasal drip and sinus pressure. Negative for ear pain, congestion and neck pain.   Eyes: Negative for redness and visual disturbance.  Respiratory: Positive for cough. Negative for shortness of breath and wheezing.   Gastrointestinal: Negative for abdominal pain and abdominal distention.  Genitourinary: Negative for dysuria, frequency and menstrual problem.  Musculoskeletal: Negative for myalgias, joint swelling and arthralgias.  Skin: Negative for rash and wound.  Neurological: Negative for dizziness, weakness and headaches.  Hematological: Negative for adenopathy. Does not bruise/bleed easily.  Psychiatric/Behavioral: Negative for sleep disturbance and decreased concentration.       Objective:   Physical Exam  Nursing note and vitals reviewed. Constitutional: She is oriented to person, place, and time. She appears well-developed and well-nourished. No distress.  HENT:  Head: Normocephalic and atraumatic.       Marked swelling of the turbinates they're swollen purple in color there is airflow obstruction there is posterior cobblestoning with clear discharge  Eyes: Conjunctivae and EOM are normal. Pupils are equal, round, and reactive to light.  Neck: Normal range of motion. Neck supple. No JVD present. No tracheal deviation present. No thyromegaly  present.  Cardiovascular: Normal rate, regular rhythm, normal heart sounds and intact distal pulses.   No murmur heard. Pulmonary/Chest: Effort normal and breath sounds normal. She has no wheezes. She exhibits no tenderness.  Abdominal: Soft. Bowel sounds are normal.  Musculoskeletal: Normal range of motion. She exhibits no edema and no tenderness.  Lymphadenopathy:    She has no cervical adenopathy.  Neurological: She is alert and oriented to person, place, and time. She has normal reflexes. No cranial nerve deficit.  Skin: Skin is warm and dry. She is not diaphoretic.  Psychiatric: She has a normal mood and affect. Her behavior is normal.          Assessment & Plan:  For the allergic rhinitis we will start a nasal spray on varus one spray in nostril twice a day.  We will need an A1c today as well as a basic metabolic panel for her diabetes and hypertension Her blood pressure appears fairly well GERD is improved with the use of Nexium

## 2011-03-22 NOTE — Patient Instructions (Signed)
Use the nasal spray twice a day

## 2011-04-12 DIAGNOSIS — E119 Type 2 diabetes mellitus without complications: Secondary | ICD-10-CM | POA: Insufficient documentation

## 2011-05-08 ENCOUNTER — Other Ambulatory Visit: Payer: Self-pay | Admitting: Internal Medicine

## 2011-05-10 ENCOUNTER — Emergency Department (HOSPITAL_COMMUNITY)
Admission: EM | Admit: 2011-05-10 | Discharge: 2011-05-10 | Disposition: A | Discharge status: Home / Self Care | Payer: BC Managed Care – PPO | Source: Home / Self Care | Attending: Emergency Medicine | Admitting: Emergency Medicine

## 2011-05-10 ENCOUNTER — Encounter (HOSPITAL_COMMUNITY): Payer: Self-pay

## 2011-05-10 ENCOUNTER — Telehealth (HOSPITAL_COMMUNITY): Payer: Self-pay | Admitting: *Deleted

## 2011-05-10 DIAGNOSIS — R05 Cough: Secondary | ICD-10-CM

## 2011-05-10 DIAGNOSIS — R059 Cough, unspecified: Secondary | ICD-10-CM

## 2011-05-10 MED ORDER — PREDNISONE 20 MG PO TABS: 40.0000 mg | ORAL_TABLET | Freq: Every day | ORAL | Status: AC

## 2011-05-10 NOTE — ED Notes (Signed)
1 wk hx of sore throat and coughing.  denies fevers.  dry hacking cough.

## 2011-05-10 NOTE — ED Provider Notes (Signed)
History     CSN: [id number] Arrival date & time: 05/10/2011  9:57 AM   First MD Initiated Contact with Patient 05/10/11 1020      Chief Complaint  Patient presents with  . Sore Throat    1 wk hx of sore throat and coughing.  denies fevers.  dry hacking cough.      (Consider location/radiation/quality/duration/timing/severity/associated sxs/prior treatment) HPI Comments: COUGHING X FOR 6 DAYS WITH A SORE THROAT FROM COUGHING NO FEVERS NO SOB, WA TREATED BY MY DOCTOR ABOUT 1 MONTH AGO, FOR A COUGH, PREDNISONE HELPED X 5 DAYS  Patient is a 60 y.o. female presenting with pharyngitis. The history is provided by the patient.  Sore Throat This is a new problem. Episode onset: X 6 DAYS. The problem occurs hourly. The problem has not changed since onset.Pertinent negatives include no chest pain, no abdominal pain, no headaches and no shortness of breath. The symptoms are aggravated by nothing. The symptoms are relieved by nothing. She has tried nothing for the symptoms. The treatment provided no relief.    Past Medical History  Diagnosis Date  . Diaphragmatic hernia without mention of obstruction or gangrene   . Stricture and stenosis of esophagus   . Nonspecific abnormal results of liver function study   . Plantar fascial fibromatosis   . Otalgia, unspecified   . Esophageal reflux   . Unspecified sleep apnea   . Depression   . Hyperlipidemia   . Diabetes mellitus   . Hypertension   . Allergy     seasonal  . Arthritis     knee  . Asthma     Past Surgical History  Procedure Date  . Appendectomy   . Abdominal hysterectomy   . Benigh cyst in lung   . Colonoscopy   . Polypectomy     hyperplastic  . Upper gastrointestinal endoscopy     Family History  Problem Relation Age of Onset  . Cancer Mother   . Breast cancer Mother   . Heart disease Father     History  Substance Use Topics  . Smoking status: Never Smoker   . Smokeless tobacco: Never Used  . Alcohol Use: No     OB History    Grav Para Term Preterm Abortions TAB SAB Ect Mult Living                  Review of Systems  Constitutional: Negative for fever and fatigue.  HENT: Positive for congestion, sore throat, rhinorrhea and postnasal drip. Negative for ear pain, neck pain and neck stiffness.   Respiratory: Positive for cough. Negative for choking, chest tightness and shortness of breath.   Cardiovascular: Negative for chest pain.  Gastrointestinal: Negative for abdominal pain.  Skin: Negative.   Neurological: Negative for headaches.    Allergies  Amoxicillin and Codeine  Home Medications   Current Outpatient Rx  Name Route Sig Dispense Refill  . ATORVASTATIN CALCIUM 10 MG PO TABS Oral Take 10 mg by mouth daily.      Marland Kitchen ESOMEPRAZOLE MAGNESIUM 40 MG PO CPDR Oral Take 1 capsule (40 mg total) by mouth daily before breakfast. 30 capsule 11  . METFORMIN HCL 1000 MG PO TABS Oral Take 1,000 mg by mouth 2 (two) times daily with a meal.      . MICARDIS HCT 40-12.5 MG PO TABS  TAKE 1 TABLET BY MOUTH ONCE A DAY 30 tablet 5  . PEG-KCL-NACL-NASULF-NA ASC-C 100 G PO SOLR  MOVIPREP take as directed  1 kit 0    Dispense as written.    BP 133/77  Pulse 92  Temp(Src) 99.6 F (37.6 C) (Oral)  Resp 18  SpO2 99%  Physical Exam  Nursing note and vitals reviewed. Constitutional: She appears well-developed and well-nourished. No distress.  Eyes: Pupils are equal, round, and reactive to light.  Neck: Normal range of motion. Neck supple. No JVD present.  Pulmonary/Chest: Effort normal and breath sounds normal.  Musculoskeletal: Normal range of motion.  Lymphadenopathy:    She has no cervical adenopathy.  Skin: Skin is warm.    ED Course  Procedures (including critical care time)  Labs Reviewed - No data to display No results found.   No diagnosis found.    MDM  COUGH AND SORE THROAT- AFEBRILE X 6 DAYS        Jimmie Molly, MD 05/10/11 1124

## 2011-06-04 ENCOUNTER — Other Ambulatory Visit: Payer: Self-pay | Admitting: Internal Medicine

## 2011-06-09 ENCOUNTER — Ambulatory Visit (INDEPENDENT_AMBULATORY_CARE_PROVIDER_SITE_OTHER): Payer: BC Managed Care – PPO | Admitting: Family Medicine

## 2011-06-09 VITALS — BP 120/90 | Temp 99.0°F | Wt 200.0 lb

## 2011-06-09 DIAGNOSIS — J309 Allergic rhinitis, unspecified: Secondary | ICD-10-CM

## 2011-06-09 DIAGNOSIS — J301 Allergic rhinitis due to pollen: Secondary | ICD-10-CM

## 2011-06-09 MED ORDER — METHYLPREDNISOLONE ACETATE 80 MG/ML IJ SUSP
80.0000 mg | Freq: Once | INTRAMUSCULAR | Status: AC
  Administered 2011-06-09: 80 mg via INTRAMUSCULAR

## 2011-06-09 MED ORDER — CETIRIZINE HCL 10 MG PO TABS: 10.0000 mg | ORAL_TABLET | Freq: Every day | ORAL | Status: DC

## 2011-06-09 NOTE — Progress Notes (Signed)
Subjective:    Patient ID: [name], female    DOB: 18-Aug-1950, 60 y.o.   MRN: [id number]  HPI 60 year old American female in with a two-day history of headache, sore throat, cough and congestion. She had similar symptoms in October and then again in November. She denies any environmental changes. In the past she's responded well to prednisone. However, she's had difficulty getting her blood sugars back down to normal since she's been taking prednisone. At this point she's been taking Benadryl Advil and Tylenol have not helped her symptoms.   Review of Systems  Constitutional: Positive for fatigue.  HENT: Positive for congestion, sneezing and postnasal drip.   Eyes: Negative.   Respiratory: Positive for cough.   Cardiovascular: Negative.   Musculoskeletal: Negative.   Skin: Negative.   Neurological: Negative.   Hematological: Negative.   Psychiatric/Behavioral: Negative.    Past Medical History  Diagnosis Date  . Diaphragmatic hernia without mention of obstruction or gangrene   . Stricture and stenosis of esophagus   . Nonspecific abnormal results of liver function study   . Plantar fascial fibromatosis   . Otalgia, unspecified   . Esophageal reflux   . Unspecified sleep apnea   . Depression   . Hyperlipidemia   . Diabetes mellitus   . Hypertension   . Allergy     seasonal  . Arthritis     knee  . Asthma     History   Social History  . Marital Status: Married    Spouse Name: N/A    Number of Children: N/A  . Years of Education: N/A   Occupational History  . unemployed    Social History Main Topics  . Smoking status: Never Smoker   . Smokeless tobacco: Never Used  . Alcohol Use: No  . Drug Use: No  . Sexually Active: Yes   Other Topics Concern  . Not on file   Social History Narrative  . No narrative on file    Past Surgical History  Procedure Date  . Appendectomy   . Abdominal hysterectomy   . Benigh cyst in lung   . Colonoscopy   .  Polypectomy     hyperplastic  . Upper gastrointestinal endoscopy     Family History  Problem Relation Age of Onset  . Cancer Mother   . Breast cancer Mother   . Heart disease Father     Allergies  Allergen Reactions  . Amoxicillin     REACTION: rash  . Codeine     Current Outpatient Prescriptions on File Prior to Visit  Medication Sig Dispense Refill  . esomeprazole (NEXIUM) 40 MG capsule Take 1 capsule (40 mg total) by mouth daily before breakfast.  30 capsule  11  . LIPITOR 10 MG tablet TAKE 1 TABLET BY MOUTH EVERY DAY  30 tablet  6  . metFORMIN (GLUCOPHAGE) 1000 MG tablet TAKE 1 TABLET BY MOUTH TWICE A DAY  60 tablet  11  . MICARDIS HCT 40-12.5 MG per tablet TAKE 1 TABLET BY MOUTH ONCE A DAY  30 tablet  5  . peg 3350 powder (MOVIPREP) 100 G SOLR MOVIPREP take as directed  1 kit  0   No current facility-administered medications on file prior to visit.    BP 120/90  Temp(Src) 99 F (37.2 C) (Oral)  Wt 200 lb (90.719 kg)chart    Objective:   Physical Exam  Constitutional: She is oriented to person, place, and time. She appears well-developed and well-nourished.  HENT:  Right Ear: External ear normal.  Left Ear: External ear normal.  Mouth/Throat: Oropharynx is clear and moist.  Cardiovascular: Normal rate, regular rhythm and normal heart sounds.   Pulmonary/Chest: Effort normal and breath sounds normal.  Neurological: She is alert and oriented to person, place, and time.  Skin: Skin is warm and dry.  Psychiatric: She has a normal mood and affect.          Assessment & Plan:  Assessment: Allergic rhinitis  Plan: Cetirizine 10 mg one by mouth daily. Depo-Medrol 80 mg IM x1. Patient to call if symptoms worsen or persist. Recheck as scheduled and when necessary. Refer to allergy and asthma specialist.

## 2011-06-09 NOTE — Patient Instructions (Addendum)
1. Zyrtec once daily  Allergic Rhinitis Allergic rhinitis is when the mucous membranes in the nose respond to allergens. Allergens are particles in the air that cause your body to have an allergic reaction. This causes you to release allergic antibodies. Through a chain of events, these eventually cause you to release histamine into the blood stream (hence the use of antihistamines). Although meant to be protective to the body, it is this release that causes your discomfort, such as frequent sneezing, congestion and an itchy runny nose.  CAUSES  The pollen allergens may come from grasses, trees, and weeds. This is seasonal allergic rhinitis, or "hay fever." Other allergens cause year-round allergic rhinitis (perennial allergic rhinitis) such as house dust mite allergen, pet dander and mold spores.  SYMPTOMS   Nasal stuffiness (congestion).   Runny, itchy nose with sneezing and tearing of the eyes.   There is often an itching of the mouth, eyes and ears.  It cannot be cured, but it can be controlled with medications. DIAGNOSIS  If you are unable to determine the offending allergen, skin or blood testing may find it. TREATMENT   Avoid the allergen.   Medications and allergy shots (immunotherapy) can help.   Hay fever may often be treated with antihistamines in pill or nasal spray forms. Antihistamines block the effects of histamine. There are over-the-counter medicines that may help with nasal congestion and swelling around the eyes. Check with your caregiver before taking or giving this medicine.  If the treatment above does not work, there are many new medications your caregiver can prescribe. Stronger medications may be used if initial measures are ineffective. Desensitizing injections can be used if medications and avoidance fails. Desensitization is when a patient is given ongoing shots until the body becomes less sensitive to the allergen. Make sure you follow up with your caregiver if  problems continue. SEEK MEDICAL CARE IF:   You develop fever (more than 100.5 F (38.1 C).   You develop a cough that does not stop easily (persistent).   You have shortness of breath.   You start wheezing.   Symptoms interfere with normal daily activities.  Document Released: 02/23/2001 Document Revised: 02/10/2011 Document Reviewed: 09/04/2008 [facility] Patient Information 2012 [place], [place].

## 2011-06-10 ENCOUNTER — Telehealth: Payer: Self-pay | Admitting: Internal Medicine

## 2011-06-10 MED ORDER — LEVOFLOXACIN 750 MG PO TABS: 750.0000 mg | ORAL_TABLET | Freq: Every day | ORAL | Status: AC

## 2011-06-10 NOTE — Telephone Encounter (Signed)
Talked with dr Lovell Sheehan- rx with levaquin 750 1 qd for 10 days--med sent to pharmacy-pt informed- pt informed if gets worse go to ed

## 2011-06-10 NOTE — Telephone Encounter (Signed)
Triage VM from [place], pt daughter.  The Zyrtec instructions at [facility] yesterday has not helped one bit.  Requesting something that will help her symptoms be sent to her pharmacy.  "She has either the flu or pneumonia, she is really sick"

## 2011-06-10 NOTE — Telephone Encounter (Signed)
Pt was seen yesterday and is now feeling worse. Pt is having body aches, cough causing chest pain, fever 100-101,fatigue, chills, headaches, loss of appetite. Pt would like med called in for her  [facility]

## 2011-06-14 ENCOUNTER — Telehealth: Payer: Self-pay | Admitting: Family

## 2011-06-14 NOTE — Telephone Encounter (Signed)
Pt is aware note ready for pick up °

## 2011-06-14 NOTE — Telephone Encounter (Signed)
Note printed.

## 2011-06-14 NOTE — Telephone Encounter (Addendum)
Pt is aware  Waiting on NP

## 2011-06-14 NOTE — Telephone Encounter (Signed)
Pt was seen on 06-09-11 dx allergic rhinitis requesting a work note to return to work today 06-14-11

## 2011-06-22 ENCOUNTER — Ambulatory Visit: Payer: BC Managed Care – PPO | Admitting: Internal Medicine

## 2011-07-16 ENCOUNTER — Ambulatory Visit (INDEPENDENT_AMBULATORY_CARE_PROVIDER_SITE_OTHER): Payer: BC Managed Care – PPO | Admitting: Internal Medicine

## 2011-07-16 ENCOUNTER — Encounter: Payer: Self-pay | Admitting: Internal Medicine

## 2011-07-16 ENCOUNTER — Ambulatory Visit: Payer: BC Managed Care – PPO | Admitting: Internal Medicine

## 2011-07-16 VITALS — BP 130/80 | HR 76 | Temp 98.5°F | Resp 16 | Ht 62.5 in | Wt 200.0 lb

## 2011-07-16 DIAGNOSIS — J209 Acute bronchitis, unspecified: Secondary | ICD-10-CM

## 2011-07-16 DIAGNOSIS — IMO0001 Reserved for inherently not codable concepts without codable children: Secondary | ICD-10-CM

## 2011-07-16 DIAGNOSIS — R059 Cough, unspecified: Secondary | ICD-10-CM

## 2011-07-16 DIAGNOSIS — R05 Cough: Secondary | ICD-10-CM

## 2011-07-16 MED ORDER — BENZONATATE 200 MG PO CAPS: 200.0000 mg | ORAL_CAPSULE | Freq: Three times a day (TID) | ORAL | Status: AC | PRN

## 2011-07-16 MED ORDER — BISOPROLOL-HYDROCHLOROTHIAZIDE 5-6.25 MG PO TABS: 1.0000 | ORAL_TABLET | Freq: Every day | ORAL | Status: DC

## 2011-07-16 MED ORDER — DOXYCYCLINE HYCLATE 100 MG PO TABS: 100.0000 mg | ORAL_TABLET | Freq: Two times a day (BID) | ORAL | Status: AC

## 2011-07-16 MED ORDER — GLUCOSE BLOOD VI STRP: ORAL_STRIP | Status: DC

## 2011-07-16 NOTE — Progress Notes (Signed)
Subjective:    Patient ID: [name], female    DOB: 11/02/1950, 61 y.o.   MRN: [id number]  HPI  Patient has upper respiratory tract symptoms off and on since October of last year.  She initially presented to a urgent care in October was treated with prednisone and symptoms slightly improved and she presented again and was treated with a second course of prednisone in November.  She was seen by her nurse practitioner in December and was given a Depo-Medrol shot.  Each time the symptoms improve slightly with prednisone course but did not resolve she has a history of hypertension chronic gastroesophageal reflux and adult onset diabetes therefore I am very uncomfortable with continued prednisone.   Review of Systems  Constitutional: Negative for activity change, appetite change and fatigue.  HENT: Positive for congestion and rhinorrhea. Negative for ear pain, neck pain, postnasal drip and sinus pressure.   Eyes: Negative for redness and visual disturbance.  Respiratory: Positive for cough. Negative for shortness of breath and wheezing.   Gastrointestinal: Negative for abdominal pain and abdominal distention.  Genitourinary: Negative for dysuria, frequency and menstrual problem.  Musculoskeletal: Negative for myalgias, joint swelling and arthralgias.  Skin: Negative for rash and wound.  Neurological: Negative for dizziness, weakness and headaches.  Hematological: Negative for adenopathy. Does not bruise/bleed easily.  Psychiatric/Behavioral: Negative for sleep disturbance and decreased concentration.   Past Medical History  Diagnosis Date  . Diaphragmatic hernia without mention of obstruction or gangrene   . Stricture and stenosis of esophagus   . Nonspecific abnormal results of liver function study   . Plantar fascial fibromatosis   . Otalgia, unspecified   . Esophageal reflux   . Unspecified sleep apnea   . Depression   . Hyperlipidemia   . Diabetes mellitus   .  Hypertension   . Allergy     seasonal  . Arthritis     knee  . Asthma     History   Social History  . Marital Status: Married    Spouse Name: N/A    Number of Children: N/A  . Years of Education: N/A   Occupational History  . unemployed    Social History Main Topics  . Smoking status: Never Smoker   . Smokeless tobacco: Never Used  . Alcohol Use: No  . Drug Use: No  . Sexually Active: Yes   Other Topics Concern  . Not on file   Social History Narrative  . No narrative on file    Past Surgical History  Procedure Date  . Appendectomy   . Abdominal hysterectomy   . Benigh cyst in lung   . Colonoscopy   . Polypectomy     hyperplastic  . Upper gastrointestinal endoscopy     Family History  Problem Relation Age of Onset  . Cancer Mother   . Breast cancer Mother   . Heart disease Father     Allergies  Allergen Reactions  . Amoxicillin     REACTION: rash  . Codeine     Current Outpatient Prescriptions on File Prior to Visit  Medication Sig Dispense Refill  . cetirizine (ZYRTEC) 10 MG tablet Take 1 tablet (10 mg total) by mouth daily.  30 tablet  11  . esomeprazole (NEXIUM) 40 MG capsule Take 1 capsule (40 mg total) by mouth daily before breakfast.  30 capsule  11  . LIPITOR 10 MG tablet TAKE 1 TABLET BY MOUTH EVERY DAY  30 tablet  6  .  metFORMIN (GLUCOPHAGE) 1000 MG tablet TAKE 1 TABLET BY MOUTH TWICE A DAY  60 tablet  11  . MICARDIS HCT 40-12.5 MG per tablet TAKE 1 TABLET BY MOUTH ONCE A DAY  30 tablet  5    BP 130/80  Pulse 76  Temp 98.5 F (36.9 C)  Resp 16  Ht 5' 2.5" (1.588 m)  Wt 200 lb (90.719 kg)  BMI 36.00 kg/m2        Objective:   Physical Exam  Constitutional: She appears well-developed and well-nourished.  HENT:       She is markedly swollen turbinates and cobblestoning of the posterior oral pharynx tympanic membranes are normal bilaterally  Cardiovascular: Normal rate and regular rhythm.   Pulmonary/Chest: Effort normal and  breath sounds normal. No respiratory distress. She has no wheezes. She has no rales.  Abdominal: Soft. Bowel sounds are normal.  Musculoskeletal: Normal range of motion.  Skin: Skin is warm and dry.          Assessment & Plan:  Patient has had a chronic bronchitis picture for the past several months.  She has been treated intermittently with steroids but no antibiotics at this time she has adult-onset diabetes with steroids have significantly exacerbated.  She is on Nexium daily and Micardis for hypertension  She was given an albuterol rescue inhaler.  I suspect that she has reactive airway disease and cough-induced asthma at this point the cough-induced asthma may be multifactorial she does have some sinusitis which reached treat with an antibiotic she does have a history of reflux and she is on Nexium and should continue that and she is on a ARB drug micardis which can exacerbate chronic cough and should be changed.  We will change her blood pressure medication to a non-ARB class

## 2011-07-16 NOTE — Patient Instructions (Signed)
Take antibiotic twice a day for 10 days take the cough medicine 3 times a day. Her blood pressure medicine and switched to the new medicine. If the cough doesn't start to subside over the next 3-5 days please notify me. At that point I would want to do a CT scan of the chest even though your chest x-ray was clear and possibly refer you to a pulmonary doctor

## 2011-08-04 ENCOUNTER — Telehealth: Payer: Self-pay | Admitting: *Deleted

## 2011-08-04 DIAGNOSIS — R05 Cough: Secondary | ICD-10-CM

## 2011-08-04 DIAGNOSIS — R053 Chronic cough: Secondary | ICD-10-CM

## 2011-08-04 NOTE — Telephone Encounter (Signed)
Per dr Temple Pacini chest ct- pt informed and order sent to terri

## 2011-08-04 NOTE — Telephone Encounter (Signed)
Pt. Was told by Dr. Lovell Sheehan to call him if she continued to cough, and she is still having a non productive cough.

## 2011-08-09 ENCOUNTER — Other Ambulatory Visit (INDEPENDENT_AMBULATORY_CARE_PROVIDER_SITE_OTHER): Payer: BC Managed Care – PPO

## 2011-08-09 DIAGNOSIS — E119 Type 2 diabetes mellitus without complications: Secondary | ICD-10-CM

## 2011-08-09 LAB — BASIC METABOLIC PANEL
CO2: 29 mEq/L (ref 19–32)
Calcium: 9.7 mg/dL (ref 8.4–10.5)
Creatinine, Ser: 0.9 mg/dL (ref 0.4–1.2)
GFR: 81.4 mL/min (ref >60.00)
Sodium: 140 mEq/L (ref 135–145)

## 2011-08-12 ENCOUNTER — Ambulatory Visit (INDEPENDENT_AMBULATORY_CARE_PROVIDER_SITE_OTHER)
Admission: RE | Admit: 2011-08-12 | Discharge: 2011-08-12 | Disposition: A | Discharge status: Home / Self Care | Payer: BC Managed Care – PPO | Source: Ambulatory Visit | Attending: Internal Medicine | Admitting: Internal Medicine

## 2011-08-12 DIAGNOSIS — R053 Chronic cough: Secondary | ICD-10-CM

## 2011-08-12 DIAGNOSIS — R059 Cough, unspecified: Secondary | ICD-10-CM

## 2011-08-12 DIAGNOSIS — R05 Cough: Secondary | ICD-10-CM

## 2011-08-12 MED ORDER — IOHEXOL 300 MG/ML  SOLN
80.0000 mL | Freq: Once | INTRAMUSCULAR | Status: AC | PRN
  Administered 2011-08-12: 80 mL via INTRAVENOUS

## 2011-08-13 ENCOUNTER — Other Ambulatory Visit: Payer: Self-pay

## 2011-08-13 ENCOUNTER — Telehealth: Payer: Self-pay | Admitting: *Deleted

## 2011-08-13 DIAGNOSIS — R053 Chronic cough: Secondary | ICD-10-CM

## 2011-08-13 DIAGNOSIS — R05 Cough: Secondary | ICD-10-CM

## 2011-08-13 NOTE — Telephone Encounter (Signed)
Pt is calling for CT results, please. 

## 2011-08-13 NOTE — Telephone Encounter (Signed)
Pt informed and per dr Lovell Sheehan send to pulmonary-pt informed and referral sent tto [name]

## 2011-08-31 ENCOUNTER — Encounter: Payer: Self-pay | Admitting: Pulmonary Disease

## 2011-08-31 ENCOUNTER — Ambulatory Visit (INDEPENDENT_AMBULATORY_CARE_PROVIDER_SITE_OTHER): Payer: BC Managed Care – PPO | Admitting: Pulmonary Disease

## 2011-08-31 VITALS — BP 108/66 | HR 69 | Temp 98.3°F | Ht 62.5 in | Wt 203.2 lb

## 2011-08-31 DIAGNOSIS — R059 Cough, unspecified: Secondary | ICD-10-CM

## 2011-08-31 DIAGNOSIS — R053 Chronic cough: Secondary | ICD-10-CM | POA: Insufficient documentation

## 2011-08-31 DIAGNOSIS — R05 Cough: Secondary | ICD-10-CM | POA: Insufficient documentation

## 2011-08-31 NOTE — Assessment & Plan Note (Signed)
The patient has a chronic cough of 5 months duration that I suspect is upper airway in origin than lower.  She has almost constant throat clearing in the office today, does note postnasal drip, and also has a long history of reflux disease with esophageal changes on her CT chest.  I think we should focus on more aggressive treatment of these entities, and work on limiting any irritation to the back of her throat.  Her spirometry today is totally normal, and therefore I think asthma is unlikely.  She also has a fairly normal chest x-ray and chest CT.  She will followup with me in 3 weeks to check on her progress.

## 2011-08-31 NOTE — Progress Notes (Signed)
  Subjective:    Patient ID: [name], female    DOB: [birth year], 61 y.o.   MRN: [id number]  HPI The pt is a 60y/o female who I have been asked to see for chronic cough.  The patient states the cough started in October of 2012 after a viral-like illness.  This resolved, however the cough has persisted.  She currently feels the cough is a 5/10 in intensity with zero being no cough and 10 being the worst since it started.  The patient's cough is dry and hacking in nature, and she complains of a tickle in her throat as well as constant throat clearing.  She will have cough paroxysms at times.  She does have postnasal drip and some sinus pressure at times, and she has noted clear rhinorrhea.  The patient also has a long history of swallowing issues as well as reflux, but feels that her current proton pump inhibitor has helped significantly.  She still will have an occasional regurgitation episodes if she eats too close to bedtime.  She does note the cough is worse after eating, and the patient does have a history of esophageal stricture and esophagitis.  The patient states her cough will worsen with prolonged conversation, and unfortunately she is on the phone all day as a Engineer, mining.  The patient also carries a diagnosis of asthma, however has had no issues with this and is unsure how this diagnosis was made.  She has had a recent chest x-ray and CT chest that were unremarkable from a pulmonary standpoint.   Review of Systems  Constitutional: Negative for fever and unexpected weight change.  HENT: Positive for congestion and dental problem. Negative for ear pain, nosebleeds, sore throat, rhinorrhea, sneezing, trouble swallowing, postnasal drip and sinus pressure.   Eyes: Negative for redness and itching.  Respiratory: Positive for cough. Negative for chest tightness, shortness of breath and wheezing.   Cardiovascular: Negative for palpitations and leg swelling.  Gastrointestinal: Negative for  nausea and vomiting.  Genitourinary: Negative for dysuria.  Musculoskeletal: Negative for joint swelling.  Skin: Negative for rash.  Neurological: Negative for headaches.  Hematological: Does not bruise/bleed easily.  Psychiatric/Behavioral: Negative for dysphoric mood. The patient is not nervous/anxious.        Objective:   Physical Exam Constitutional:  Obese female, no acute distress  HENT:  Nares patent without discharge, large turbinates  Oropharynx without exudate, palate and uvula are mildly elongated.  Eyes:  Perrla, eomi, no scleral icterus  Neck:  No JVD, no TMG  Cardiovascular:  Normal rate, regular rhythm, no rubs or gallops.  No murmurs        Intact distal pulses  Pulmonary :  Normal breath sounds, no stridor or respiratory distress   No rales, rhonchi, or wheezing  Abdominal:  Soft, nondistended, bowel sounds present.  No tenderness noted.   Musculoskeletal:  No lower extremity edema noted.  Lymph Nodes:  No cervical lymphadenopathy noted  Skin:  No cyanosis noted  Neurologic:  Alert, appropriate, moves all 4 extremities without obvious deficit.         Assessment & Plan:

## 2011-08-31 NOTE — Patient Instructions (Signed)
Increase nexium to one in am AND pm before eating. Take chlorpheniramine 8mg  at bedtime for next 2-3 weeks No throat clearing!! Your cough will not resolve until this stops Use hard candy to bathe the back of your throat all during the day to prevent throat clearing Limit voice use as much as possible.  NO singing or yelling.  followup with me in 3 weeks to check on progress, but call if you feel cough is worsening.

## 2011-09-17 ENCOUNTER — Ambulatory Visit (INDEPENDENT_AMBULATORY_CARE_PROVIDER_SITE_OTHER): Payer: BC Managed Care – PPO | Admitting: Internal Medicine

## 2011-09-17 ENCOUNTER — Encounter: Payer: Self-pay | Admitting: Internal Medicine

## 2011-09-17 VITALS — BP 130/80 | HR 72 | Temp 98.2°F | Resp 16 | Ht 62.5 in | Wt 206.0 lb

## 2011-09-17 DIAGNOSIS — K449 Diaphragmatic hernia without obstruction or gangrene: Secondary | ICD-10-CM

## 2011-09-17 DIAGNOSIS — R053 Chronic cough: Secondary | ICD-10-CM

## 2011-09-17 DIAGNOSIS — K219 Gastro-esophageal reflux disease without esophagitis: Secondary | ICD-10-CM

## 2011-09-17 DIAGNOSIS — R059 Cough, unspecified: Secondary | ICD-10-CM

## 2011-09-17 DIAGNOSIS — I10 Essential (primary) hypertension: Secondary | ICD-10-CM

## 2011-09-17 DIAGNOSIS — E119 Type 2 diabetes mellitus without complications: Secondary | ICD-10-CM

## 2011-09-17 DIAGNOSIS — R05 Cough: Secondary | ICD-10-CM

## 2011-09-17 MED ORDER — ESOMEPRAZOLE MAGNESIUM 40 MG PO CPDR: 40.0000 mg | DELAYED_RELEASE_CAPSULE | Freq: Two times a day (BID) | ORAL | Status: DC

## 2011-09-17 MED ORDER — ESOMEPRAZOLE MAGNESIUM 40 MG PO CPDR: 40.0000 mg | DELAYED_RELEASE_CAPSULE | Freq: Every day | ORAL | Status: DC

## 2011-09-17 NOTE — Progress Notes (Signed)
  Subjective:    Patient ID: [name], female    DOB: 04-10-1951, 61 y.o.   MRN: [id number]  HPI Patient is a 61 year old female who presents for hypertension hyperlipidemia and diabetes as well as a history of a chronic cough she had a chest x-ray a subsequent CT scan which revealed some small nodules which are stable since 2009 question of a carinal area cyst which appeared benign and a small lipoma in the distal esophagus.  Since she has been on treatment which was to use hard candy as oral stimulant her cough is largely improved.  That would lead to the conclusion that this is indeed a benign etiology.   Review of Systems  Constitutional: Negative for activity change, appetite change and fatigue.  HENT: Negative for ear pain, congestion, neck pain, postnasal drip and sinus pressure.   Eyes: Negative for redness and visual disturbance.  Respiratory: Negative for cough, shortness of breath and wheezing.   Gastrointestinal: Negative for abdominal pain and abdominal distention.  Genitourinary: Negative for dysuria, frequency and menstrual problem.  Musculoskeletal: Negative for myalgias, joint swelling and arthralgias.  Skin: Negative for rash and wound.  Neurological: Negative for dizziness, weakness and headaches.  Hematological: Negative for adenopathy. Does not bruise/bleed easily.  Psychiatric/Behavioral: Negative for sleep disturbance and decreased concentration.       Objective:   Physical Exam  Nursing note and vitals reviewed. Constitutional: She is oriented to person, place, and time. She appears well-developed and well-nourished. No distress.  HENT:  Head: Normocephalic and atraumatic.  Right Ear: External ear normal.  Left Ear: External ear normal.  Nose: Nose normal.  Mouth/Throat: Oropharynx is clear and moist.  Eyes: Conjunctivae and EOM are normal. Pupils are equal, round, and reactive to light.  Neck: Normal range of motion. Neck supple. No JVD present.  No tracheal deviation present. No thyromegaly present.  Cardiovascular: Normal rate, regular rhythm, normal heart sounds and intact distal pulses.   No murmur heard. Pulmonary/Chest: Effort normal and breath sounds normal. She has no wheezes. She exhibits no tenderness.  Abdominal: Soft. Bowel sounds are normal.  Musculoskeletal: Normal range of motion. She exhibits no edema and no tenderness.  Lymphadenopathy:    She has no cervical adenopathy.  Neurological: She is alert and oriented to person, place, and time. She has normal reflexes. No cranial nerve deficit.  Skin: Skin is warm and dry. She is not diaphoretic.  Psychiatric: She has a normal mood and affect. Her behavior is normal.          Assessment & Plan:  The patient has a chronic cough that has improved on a PPI twice daily leading to the conclusion that the etiology may be more likely to be GERD CT scan showed stable pulmonary nodules from 2009 indicating a benign etiology there was a carinal cyst that was seen of unknown significance a small lipoma in the distal esophagus.  Patient has significantly improved with medication changes. Diabetes appears to be stable kidney function is stable and blood pressure is stable

## 2011-09-17 NOTE — Patient Instructions (Signed)
The most probable cause of the chronic cough appears to be reflux since it is improved with the Nexium dosing twice daily.  I believe allowed to continue the Nexium twice daily long-term because of her symptoms

## 2011-09-21 ENCOUNTER — Encounter: Payer: Self-pay | Admitting: Pulmonary Disease

## 2011-09-21 ENCOUNTER — Ambulatory Visit (INDEPENDENT_AMBULATORY_CARE_PROVIDER_SITE_OTHER): Payer: BC Managed Care – PPO | Admitting: Pulmonary Disease

## 2011-09-21 VITALS — BP 118/72 | HR 73 | Temp 98.4°F | Ht 62.5 in | Wt 206.6 lb

## 2011-09-21 DIAGNOSIS — R059 Cough, unspecified: Secondary | ICD-10-CM

## 2011-09-21 DIAGNOSIS — R05 Cough: Secondary | ICD-10-CM

## 2011-09-21 DIAGNOSIS — R053 Chronic cough: Secondary | ICD-10-CM

## 2011-09-21 NOTE — Patient Instructions (Signed)
Decrease nexium back to your usual once a day. Continue chlorpheniramine every night (not zyrtec) for next 2 weeks, then if stable, can take either chlorpheniramine or zyrtec as needed for allergy symptoms. Continue to minimize voice use, no throat clearing, hard candy until cough is 100% gone Please call if your cough begins to re-escalate.

## 2011-09-21 NOTE — Progress Notes (Signed)
  Subjective:    Patient ID: [name], female    DOB: 1951/04/16, 60 y.o.   MRN: [id number]  HPI The patient comes in today for followup of her chronic cough.  At the last visit, this was felt to be upper airway in origin, and she was treated for postnasal drip, laryngopharyngeal reflux, and cyclical coughing.  She has seen significant improvement in her cough, and it has almost totally resolved.  However, she is still taking b.i.d. Proton inhibitor and a daily antihistamine.   Review of Systems  Constitutional: Positive for diaphoresis. Negative for fever and unexpected weight change.  HENT: Positive for rhinorrhea and postnasal drip. Negative for ear pain, nosebleeds, congestion, sore throat, sneezing, trouble swallowing, dental problem and sinus pressure.   Eyes: Negative for redness and itching.  Respiratory: Positive for cough. Negative for chest tightness, shortness of breath and wheezing.   Cardiovascular: Negative for palpitations and leg swelling.  Gastrointestinal: Negative for nausea and vomiting.  Genitourinary: Negative for dysuria.  Musculoskeletal: Negative for joint swelling.  Skin: Negative for rash.  Neurological: Negative for headaches.  Hematological: Does not bruise/bleed easily.  Psychiatric/Behavioral: Negative for dysphoric mood. The patient is not nervous/anxious.        Objective:   Physical Exam Overweight female in no acute distress Nose without purulence or discharge noted Lower extremities without edema, no cyanosis Alert and oriented, moves all 4 extremities.       Assessment & Plan:

## 2011-09-21 NOTE — Assessment & Plan Note (Signed)
The patient has had a significant improvement in her cough with treatment for upper airway causes.  I have asked her to decrease her proton pump inhibitor back to once a day, but to continue with her antihistamine for the next few weeks.  I've also asked her to continue with her behavioral therapies until her cough is 100% resolved.  She is to call if her cough begins to escalate again.

## 2011-12-21 ENCOUNTER — Ambulatory Visit: Payer: BC Managed Care – PPO | Admitting: Internal Medicine

## 2012-01-03 ENCOUNTER — Encounter: Payer: Self-pay | Admitting: Family Medicine

## 2012-01-03 ENCOUNTER — Ambulatory Visit (INDEPENDENT_AMBULATORY_CARE_PROVIDER_SITE_OTHER): Payer: BC Managed Care – PPO | Admitting: Family Medicine

## 2012-01-03 VITALS — BP 127/84 | HR 70 | Temp 97.5°F | Ht 62.5 in | Wt 204.0 lb

## 2012-01-03 DIAGNOSIS — R109 Unspecified abdominal pain: Secondary | ICD-10-CM

## 2012-01-03 DIAGNOSIS — E785 Hyperlipidemia, unspecified: Secondary | ICD-10-CM

## 2012-01-03 DIAGNOSIS — R1013 Epigastric pain: Secondary | ICD-10-CM

## 2012-01-03 DIAGNOSIS — R7401 Elevation of levels of liver transaminase levels: Secondary | ICD-10-CM

## 2012-01-03 DIAGNOSIS — Z23 Encounter for immunization: Secondary | ICD-10-CM

## 2012-01-03 DIAGNOSIS — N809 Endometriosis, unspecified: Secondary | ICD-10-CM | POA: Insufficient documentation

## 2012-01-03 DIAGNOSIS — E119 Type 2 diabetes mellitus without complications: Secondary | ICD-10-CM

## 2012-01-03 DIAGNOSIS — IMO0001 Reserved for inherently not codable concepts without codable children: Secondary | ICD-10-CM

## 2012-01-03 DIAGNOSIS — I10 Essential (primary) hypertension: Secondary | ICD-10-CM

## 2012-01-03 DIAGNOSIS — K219 Gastro-esophageal reflux disease without esophagitis: Secondary | ICD-10-CM

## 2012-01-03 DIAGNOSIS — R079 Chest pain, unspecified: Secondary | ICD-10-CM

## 2012-01-03 LAB — CBC
HCT: 39.1 % (ref 36.0–46.0)
Hemoglobin: 12.7 g/dL (ref 12.0–15.0)
MCHC: 32.5 g/dL (ref 30.0–36.0)
MCV: 80.9 fl (ref 78.0–100.0)
Platelets: 245 K/uL (ref 150.0–400.0)
RBC: 4.84 Mil/uL (ref 3.87–5.11)
RDW: 13.8 % (ref 11.5–14.6)
WBC: 6.2 K/uL (ref 4.5–10.5)

## 2012-01-03 LAB — RENAL FUNCTION PANEL
Albumin: 3.8 g/dL (ref 3.5–5.2)
BUN: 12 mg/dL (ref 6–23)
CO2: 28 meq/L (ref 19–32)
Calcium: 9.3 mg/dL (ref 8.4–10.5)
Chloride: 98 meq/L (ref 96–112)
Creatinine, Ser: 0.8 mg/dL (ref 0.4–1.2)
GFR: 98.57 mL/min
Glucose, Bld: 254 mg/dL — ABNORMAL HIGH (ref 70–99)
Phosphorus: 3.4 mg/dL (ref 2.3–4.6)
Potassium: 3.8 meq/L (ref 3.5–5.1)
Sodium: 137 meq/L (ref 135–145)

## 2012-01-03 LAB — LIPID PANEL
Cholesterol: 143 mg/dL (ref 0–200)
HDL: 38.6 mg/dL — ABNORMAL LOW
LDL Cholesterol: 82 mg/dL (ref 0–99)
Total CHOL/HDL Ratio: 4
Triglycerides: 114 mg/dL (ref 0.0–149.0)
VLDL: 22.8 mg/dL (ref 0.0–40.0)

## 2012-01-03 LAB — POCT URINALYSIS DIPSTICK
Blood, UA: NEGATIVE
Ketones, UA: NEGATIVE
Protein, UA: NEGATIVE
Spec Grav, UA: 1.02
pH, UA: 5

## 2012-01-03 LAB — HEPATIC FUNCTION PANEL
Alkaline Phosphatase: 88 U/L (ref 39–117)
Bilirubin, Direct: 0.1 mg/dL (ref 0.0–0.3)
Total Bilirubin: 0.7 mg/dL (ref 0.3–1.2)

## 2012-01-03 MED ORDER — ASPIRIN 81 MG PO TBEC: 81.0000 mg | DELAYED_RELEASE_TABLET | Freq: Every day | ORAL | Status: DC

## 2012-01-03 MED ORDER — GLUCOSE BLOOD VI STRP: ORAL_STRIP | Status: DC

## 2012-01-03 MED ORDER — TETANUS-DIPHTH-ACELL PERTUSSIS 5-2.5-18.5 LF-MCG/0.5 IM SUSP: 0.5000 mL | Freq: Once | INTRAMUSCULAR | Status: DC

## 2012-01-03 MED ORDER — ESOMEPRAZOLE MAGNESIUM 40 MG PO CPDR: 40.0000 mg | DELAYED_RELEASE_CAPSULE | Freq: Every day | ORAL | Status: DC

## 2012-01-03 MED ORDER — NITROGLYCERIN 0.4 MG SL SUBL: 0.4000 mg | SUBLINGUAL_TABLET | SUBLINGUAL | Status: DC | PRN

## 2012-01-03 MED ORDER — RANITIDINE HCL 150 MG PO TABS: 150.0000 mg | ORAL_TABLET | Freq: Every evening | ORAL | Status: DC | PRN

## 2012-01-03 NOTE — Patient Instructions (Addendum)
Preventive Care for Adults, Female A healthy lifestyle and preventive care can promote health and wellness. Preventive health guidelines for women include the following key practices.  A routine yearly physical is a good way to check with your caregiver about your health and preventive screening. It is a chance to share any concerns and updates on your health, and to receive a thorough exam.   Visit your dentist for a routine exam and preventive care every 6 months. Brush your teeth twice a day and floss once a day. Good oral hygiene prevents tooth decay and gum disease.   The frequency of eye exams is based on your age, health, family medical history, use of contact lenses, and other factors. Follow your caregiver's recommendations for frequency of eye exams.   Eat a healthy diet. Foods like vegetables, fruits, whole grains, low-fat dairy products, and lean protein foods contain the nutrients you need without too many calories. Decrease your intake of foods high in solid fats, added sugars, and salt. Eat the right amount of calories for you.Get information about a proper diet from your caregiver, if necessary.   Regular physical exercise is one of the most important things you can do for your health. Most adults should get at least 150 minutes of moderate-intensity exercise (any activity that increases your heart rate and causes you to sweat) each week. In addition, most adults need muscle-strengthening exercises on 2 or more days a week.   Maintain a healthy weight. The body mass index (BMI) is a screening tool to identify possible weight problems. It provides an estimate of body fat based on height and weight. Your caregiver can help determine your BMI, and can help you achieve or maintain a healthy weight.For adults 20 years and older:   A BMI below 18.5 is considered underweight.   A BMI of 18.5 to 24.9 is normal.   A BMI of 25 to 29.9 is considered overweight.   A BMI of 30 and above is  considered obese.   Maintain normal blood lipids and cholesterol levels by exercising and minimizing your intake of saturated fat. Eat a balanced diet with plenty of fruit and vegetables. Blood tests for lipids and cholesterol should begin at age 20 and be repeated every 5 years. If your lipid or cholesterol levels are high, you are over 50, or you are at high risk for heart disease, you may need your cholesterol levels checked more frequently.Ongoing high lipid and cholesterol levels should be treated with medicines if diet and exercise are not effective.   If you smoke, find out from your caregiver how to quit. If you do not use tobacco, do not start.   If you are pregnant, do not drink alcohol. If you are breastfeeding, be very cautious about drinking alcohol. If you are not pregnant and choose to drink alcohol, do not exceed 1 drink per day. One drink is considered to be 12 ounces (355 mL) of beer, 5 ounces (148 mL) of wine, or 1.5 ounces (44 mL) of liquor.   Avoid use of street drugs. Do not share needles with anyone. Ask for help if you need support or instructions about stopping the use of drugs.   High blood pressure causes heart disease and increases the risk of stroke. Your blood pressure should be checked at least every 1 to 2 years. Ongoing high blood pressure should be treated with medicines if weight loss and exercise are not effective.   If you are 55 to 59   years old, ask your caregiver if you should take aspirin to prevent strokes.   Diabetes screening involves taking a blood sample to check your fasting blood sugar level. This should be done once every 3 years, after age 45, if you are within normal weight and without risk factors for diabetes. Testing should be considered at a younger age or be carried out more frequently if you are overweight and have at least 1 risk factor for diabetes.   Breast cancer screening is essential preventive care for women. You should practice "breast  self-awareness." This means understanding the normal appearance and feel of your breasts and may include breast self-examination. Any changes detected, no matter how small, should be reported to a caregiver. Women in their 20s and 30s should have a clinical breast exam (CBE) by a caregiver as part of a regular health exam every 1 to 3 years. After age 40, women should have a CBE every year. Starting at age 40, women should consider having a mammography (breast X-ray test) every year. Women who have a family history of breast cancer should talk to their caregiver about genetic screening. Women at a high risk of breast cancer should talk to their caregivers about having magnetic resonance imaging (MRI) and a mammography every year.   The Pap test is a screening test for cervical cancer. A Pap test can show cell changes on the cervix that might become cervical cancer if left untreated. A Pap test is a procedure in which cells are obtained and examined from the lower end of the uterus (cervix).   Women should have a Pap test starting at age 21.   Between ages 21 and 29, Pap tests should be repeated every 2 years.   Beginning at age 30, you should have a Pap test every 3 years as long as the past 3 Pap tests have been normal.   Some women have medical problems that increase the chance of getting cervical cancer. Talk to your caregiver about these problems. It is especially important to talk to your caregiver if a new problem develops soon after your last Pap test. In these cases, your caregiver may recommend more frequent screening and Pap tests.   The above recommendations are the same for women who have or have not gotten the vaccine for human papillomavirus (HPV).   If you had a hysterectomy for a problem that was not cancer or a condition that could lead to cancer, then you no longer need Pap tests. Even if you no longer need a Pap test, a regular exam is a good idea to make sure no other problems are  starting.   If you are between ages 65 and 70, and you have had normal Pap tests going back 10 years, you no longer need Pap tests. Even if you no longer need a Pap test, a regular exam is a good idea to make sure no other problems are starting.   If you have had past treatment for cervical cancer or a condition that could lead to cancer, you need Pap tests and screening for cancer for at least 20 years after your treatment.   If Pap tests have been discontinued, risk factors (such as a new sexual partner) need to be reassessed to determine if screening should be resumed.   The HPV test is an additional test that may be used for cervical cancer screening. The HPV test looks for the virus that can cause the cell changes on the cervix.   The cells collected during the Pap test can be tested for HPV. The HPV test could be used to screen women aged 30 years and older, and should be used in women of any age who have unclear Pap test results. After the age of 30, women should have HPV testing at the same frequency as a Pap test.   Colorectal cancer can be detected and often prevented. Most routine colorectal cancer screening begins at the age of 50 and continues through age 75. However, your caregiver may recommend screening at an earlier age if you have risk factors for colon cancer. On a yearly basis, your caregiver may provide home test kits to check for hidden blood in the stool. Use of a small camera at the end of a tube, to directly examine the colon (sigmoidoscopy or colonoscopy), can detect the earliest forms of colorectal cancer. Talk to your caregiver about this at age 50, when routine screening begins. Direct examination of the colon should be repeated every 5 to 10 years through age 75, unless early forms of pre-cancerous polyps or small growths are found.   Hepatitis C blood testing is recommended for all people born from 1945 through 1965 and any individual with known risks for hepatitis C.    Practice safe sex. Use condoms and avoid high-risk sexual practices to reduce the spread of sexually transmitted infections (STIs). STIs include gonorrhea, chlamydia, syphilis, trichomonas, herpes, HPV, and human immunodeficiency virus (HIV). Herpes, HIV, and HPV are viral illnesses that have no cure. They can result in disability, cancer, and death. Sexually active women aged 25 and younger should be checked for chlamydia. Older women with new or multiple partners should also be tested for chlamydia. Testing for other STIs is recommended if you are sexually active and at increased risk.   Osteoporosis is a disease in which the bones lose minerals and strength with aging. This can result in serious bone fractures. The risk of osteoporosis can be identified using a bone density scan. Women ages 65 and over and women at risk for fractures or osteoporosis should discuss screening with their caregivers. Ask your caregiver whether you should take a calcium supplement or vitamin D to reduce the rate of osteoporosis.   Menopause can be associated with physical symptoms and risks. Hormone replacement therapy is available to decrease symptoms and risks. You should talk to your caregiver about whether hormone replacement therapy is right for you.   Use sunscreen with sun protection factor (SPF) of 30 or more. Apply sunscreen liberally and repeatedly throughout the day. You should seek shade when your shadow is shorter than you. Protect yourself by wearing long sleeves, pants, a wide-brimmed hat, and sunglasses year round, whenever you are outdoors.   Once a month, do a whole body skin exam, using a mirror to look at the skin on your back. Notify your caregiver of new moles, moles that have irregular borders, moles that are larger than a pencil eraser, or moles that have changed in shape or color.   Stay current with required immunizations.   Influenza. You need a dose every fall (or winter). The composition of  the flu vaccine changes each year, so being vaccinated once is not enough.   Pneumococcal polysaccharide. You need 1 to 2 doses if you smoke cigarettes or if you have certain chronic medical conditions. You need 1 dose at age 65 (or older) if you have never been vaccinated.   Tetanus, diphtheria, pertussis (Tdap, Td). Get 1 dose of   Tdap vaccine if you are younger than age 65, are over 65 and have contact with an infant, are a healthcare worker, are pregnant, or simply want to be protected from whooping cough. After that, you need a Td booster dose every 10 years. Consult your caregiver if you have not had at least 3 tetanus and diphtheria-containing shots sometime in your life or have a deep or dirty wound.   HPV. You need this vaccine if you are a woman age 26 or younger. The vaccine is given in 3 doses over 6 months.   Measles, mumps, rubella (MMR). You need at least 1 dose of MMR if you were born in 1957 or later. You may also need a second dose.   Meningococcal. If you are age 19 to 21 and a first-year college student living in a residence hall, or have one of several medical conditions, you need to get vaccinated against meningococcal disease. You may also need additional booster doses.   Zoster (shingles). If you are age 60 or older, you should get this vaccine.   Varicella (chickenpox). If you have never had chickenpox or you were vaccinated but received only 1 dose, talk to your caregiver to find out if you need this vaccine.   Hepatitis A. You need this vaccine if you have a specific risk factor for hepatitis A virus infection or you simply wish to be protected from this disease. The vaccine is usually given as 2 doses, 6 to 18 months apart.   Hepatitis B. You need this vaccine if you have a specific risk factor for hepatitis B virus infection or you simply wish to be protected from this disease. The vaccine is given in 3 doses, usually over 6 months.  Preventive Services /  Frequency Ages 19 to 39  Blood pressure check.** / Every 1 to 2 years.   Lipid and cholesterol check.** / Every 5 years beginning at age 20.   Clinical breast exam.** / Every 3 years for women in their 20s and 30s.   Pap test.** / Every 2 years from ages 21 through 29. Every 3 years starting at age 30 through age 65 or 70 with a history of 3 consecutive normal Pap tests.   HPV screening.** / Every 3 years from ages 30 through ages 65 to 70 with a history of 3 consecutive normal Pap tests.   Hepatitis C blood test.** / For any individual with known risks for hepatitis C.   Skin self-exam. / Monthly.   Influenza immunization.** / Every year.   Pneumococcal polysaccharide immunization.** / 1 to 2 doses if you smoke cigarettes or if you have certain chronic medical conditions.   Tetanus, diphtheria, pertussis (Tdap, Td) immunization. / A one-time dose of Tdap vaccine. After that, you need a Td booster dose every 10 years.   HPV immunization. / 3 doses over 6 months, if you are 26 and younger.   Measles, mumps, rubella (MMR) immunization. / You need at least 1 dose of MMR if you were born in 1957 or later. You may also need a second dose.   Meningococcal immunization. / 1 dose if you are age 19 to 21 and a first-year college student living in a residence hall, or have one of several medical conditions, you need to get vaccinated against meningococcal disease. You may also need additional booster doses.   Varicella immunization.** / Consult your caregiver.   Hepatitis A immunization.** / Consult your caregiver. 2 doses, 6 to 18 months   apart.   Hepatitis B immunization.** / Consult your caregiver. 3 doses usually over 6 months.  Ages 40 to 64  Blood pressure check.** / Every 1 to 2 years.   Lipid and cholesterol check.** / Every 5 years beginning at age 20.   Clinical breast exam.** / Every year after age 40.   Mammogram.** / Every year beginning at age 40 and continuing for as  long as you are in good health. Consult with your caregiver.   Pap test.** / Every 3 years starting at age 30 through age 65 or 70 with a history of 3 consecutive normal Pap tests.   HPV screening.** / Every 3 years from ages 30 through ages 65 to 70 with a history of 3 consecutive normal Pap tests.   Fecal occult blood test (FOBT) of stool. / Every year beginning at age 50 and continuing until age 75. You may not need to do this test if you get a colonoscopy every 10 years.   Flexible sigmoidoscopy or colonoscopy.** / Every 5 years for a flexible sigmoidoscopy or every 10 years for a colonoscopy beginning at age 50 and continuing until age 75.   Hepatitis C blood test.** / For all people born from 1945 through 1965 and any individual with known risks for hepatitis C.   Skin self-exam. / Monthly.   Influenza immunization.** / Every year.   Pneumococcal polysaccharide immunization.** / 1 to 2 doses if you smoke cigarettes or if you have certain chronic medical conditions.   Tetanus, diphtheria, pertussis (Tdap, Td) immunization.** / A one-time dose of Tdap vaccine. After that, you need a Td booster dose every 10 years.   Measles, mumps, rubella (MMR) immunization. / You need at least 1 dose of MMR if you were born in 1957 or later. You may also need a second dose.   Varicella immunization.** / Consult your caregiver.   Meningococcal immunization.** / Consult your caregiver.   Hepatitis A immunization.** / Consult your caregiver. 2 doses, 6 to 18 months apart.   Hepatitis B immunization.** / Consult your caregiver. 3 doses, usually over 6 months.  Ages 65 and over  Blood pressure check.** / Every 1 to 2 years.   Lipid and cholesterol check.** / Every 5 years beginning at age 20.   Clinical breast exam.** / Every year after age 40.   Mammogram.** / Every year beginning at age 40 and continuing for as long as you are in good health. Consult with your caregiver.   Pap test.** /  Every 3 years starting at age 30 through age 65 or 70 with a 3 consecutive normal Pap tests. Testing can be stopped between 65 and 70 with 3 consecutive normal Pap tests and no abnormal Pap or HPV tests in the past 10 years.   HPV screening.** / Every 3 years from ages 30 through ages 65 or 70 with a history of 3 consecutive normal Pap tests. Testing can be stopped between 65 and 70 with 3 consecutive normal Pap tests and no abnormal Pap or HPV tests in the past 10 years.   Fecal occult blood test (FOBT) of stool. / Every year beginning at age 50 and continuing until age 75. You may not need to do this test if you get a colonoscopy every 10 years.   Flexible sigmoidoscopy or colonoscopy.** / Every 5 years for a flexible sigmoidoscopy or every 10 years for a colonoscopy beginning at age 50 and continuing until age 75.   Hepatitis   C blood test.** / For all people born from 1945 through 1965 and any individual with known risks for hepatitis C.   Osteoporosis screening.** / A one-time screening for women ages 65 and over and women at risk for fractures or osteoporosis.   Skin self-exam. / Monthly.   Influenza immunization.** / Every year.   Pneumococcal polysaccharide immunization.** / 1 dose at age 65 (or older) if you have never been vaccinated.   Tetanus, diphtheria, pertussis (Tdap, Td) immunization. / A one-time dose of Tdap vaccine if you are over 65 and have contact with an infant, are a healthcare worker, or simply want to be protected from whooping cough. After that, you need a Td booster dose every 10 years.   Varicella immunization.** / Consult your caregiver.   Meningococcal immunization.** / Consult your caregiver.   Hepatitis A immunization.** / Consult your caregiver. 2 doses, 6 to 18 months apart.   Hepatitis B immunization.** / Check with your caregiver. 3 doses, usually over 6 months.  ** Family history and personal history of risk and conditions may change your caregiver's  recommendations. Document Released: 07/27/2001 Document Revised: 05/20/2011 Document Reviewed: 10/26/2010 [employer] Patient Information 2012 [employer]. 

## 2012-01-06 ENCOUNTER — Encounter: Payer: Self-pay | Admitting: Family Medicine

## 2012-01-06 DIAGNOSIS — R079 Chest pain, unspecified: Secondary | ICD-10-CM | POA: Insufficient documentation

## 2012-01-06 NOTE — Assessment & Plan Note (Signed)
Adequately controlled on current meds no changes 

## 2012-01-06 NOTE — Assessment & Plan Note (Signed)
Well controlled on current meds. No changes

## 2012-01-06 NOTE — Assessment & Plan Note (Addendum)
Poor control when [id number] is checked. encouraged avoidance of all carbs. Will have her increase her Metformin to 2500 mg daily til she returns and then likely start her on a DDP4 when she returns.

## 2012-01-06 NOTE — Assessment & Plan Note (Signed)
Avoid offending foods and use Nexium and Ranitidine

## 2012-01-06 NOTE — Assessment & Plan Note (Signed)
Will monitor

## 2012-01-06 NOTE — Progress Notes (Signed)
Patient ID: BREAH JOA, female   DOB: [birth year], 61 y.o.   MRN: [id number] ENIYA CANNADY [id number] [birth year] 01/06/2012      Progress Note New Patient  Subjective  Chief Complaint  Chief Complaint  Patient presents with  . Establish Care    new patient- transfer from [facility]    HPI  Patient is a 61 year old African American female who is in today to establish care. She notes that recently her blood sugars have been trending up and the 200s recently. She notes an increase in stool frequency for a couple of day has been having some increased belching as well as some intermittent left upper left lower quadrant pains. She describes a sharp and fleeting pains. She also has intermittent episodes of chest pain with palpitations and some diaphoresis. No CP/palp/SOB/GU c/o today. Notes her bowel movements are more frequent lately moving them 2-3 times a day. No bloody or tarry stool. No diarrhea.  No urinary frequency or dysuria  Past Medical History  Diagnosis Date  . Diaphragmatic hernia without mention of obstruction or gangrene   . Stricture and stenosis of esophagus   . Nonspecific abnormal results of liver function study   . Plantar fascial fibromatosis   . Otalgia, unspecified   . Esophageal reflux   . Unspecified sleep apnea   . Hyperlipidemia   . Diabetes mellitus 52    type 2  . Hypertension 52  . Allergy     seasonal  . Endometriosis   . Arthritis     knee-left  . Depression     job/stress related    Past Surgical History  Procedure Date  . Appendectomy   . Benigh cyst in lung 2006    right  . Colonoscopy   . Polypectomy     hyperplastic  . Upper gastrointestinal endoscopy   . Abdominal hysterectomy 1982    partial  . Tubal ligation   . Seba     Family History  Problem Relation Age of Onset  . Breast cancer Mother   . Cancer Mother     breast  . Hypertension Mother   . Diabetes Mother     type 2  . Heart disease Father   . Hyperlipidemia  Father   . Hypertension Father   . Heart attack Father   . Diabetes Brother     type 2  . Heart disease Maternal Grandmother   . Heart attack Maternal Grandfather   . Hypertension Maternal Grandfather   . Stroke Paternal Grandmother   . Hypertension Sister   . Hypertension Brother     History   Social History  . Marital Status: Married    Spouse Name: Marcial Pacas    Number of Children: N/A  . Years of Education: N/A   Occupational History  . Engineer, mining    Social History Main Topics  . Smoking status: Never Smoker   . Smokeless tobacco: Never Used  . Alcohol Use: No  . Drug Use: No  . Sexually Active: Yes   Other Topics Concern  . Not on file   Social History Narrative  . No narrative on file    Current Outpatient Prescriptions on File Prior to Visit  Medication Sig Dispense Refill  . bisoprolol-hydrochlorothiazide (ZIAC) 5-6.25 MG per tablet Take 1 tablet by mouth daily.  30 tablet  6  . esomeprazole (NEXIUM) 40 MG capsule Take 1 capsule (40 mg total) by mouth daily before breakfast.  30 capsule  5  .  LIPITOR 10 MG tablet TAKE 1 TABLET BY MOUTH EVERY DAY  30 tablet  6  . metFORMIN (GLUCOPHAGE) 1000 MG tablet       . nitroGLYCERIN (NITROSTAT) 0.4 MG SL tablet Place 1 tablet (0.4 mg total) under the tongue every 5 (five) minutes as needed for chest pain. After 3 doses proceed to ER for further evaluation if now resolution  25 tablet  1  . ranitidine (ZANTAC) 150 MG tablet Take 1 tablet (150 mg total) by mouth at bedtime as needed for heartburn.  30 tablet  5   No current facility-administered medications on file prior to visit.    Allergies  Allergen Reactions  . Amoxicillin     REACTION: rash  . Codeine     Review of Systems  Review of Systems  Constitutional: Negative for chills and malaise/fatigue.  HENT: Negative for hearing loss, nosebleeds and congestion.   Eyes: Negative for discharge.  Respiratory: Negative for cough, sputum production, shortness  of breath and wheezing.   Cardiovascular: Negative for chest pain, palpitations and leg swelling.  Gastrointestinal: Positive for heartburn and abdominal pain. Negative for nausea, vomiting, diarrhea, constipation and blood in stool.       Llq and luq  Genitourinary: Negative for dysuria, urgency, frequency and hematuria.  Musculoskeletal: Positive for back pain. Negative for myalgias and falls.  Skin: Negative for rash.  Neurological: Negative for dizziness, tremors, sensory change, focal weakness, loss of consciousness, weakness and headaches.  Endo/Heme/Allergies: Negative for polydipsia. Does not bruise/bleed easily.  Psychiatric/Behavioral: Negative for depression and suicidal ideas. The patient is not nervous/anxious and does not have insomnia.     Objective  BP 127/84  Pulse 70  Temp 97.5 F (36.4 C) (Temporal)  Ht 5' 2.5" (1.588 m)  Wt 204 lb (92.534 kg)  BMI 36.72 kg/m2  SpO2 97%  Physical Exam  Physical Exam  Constitutional: She is oriented to person, place, and time and well-developed, well-nourished, and in no distress. No distress.  HENT:  Head: Normocephalic and atraumatic.  Right Ear: External ear normal.  Left Ear: External ear normal.  Nose: Nose normal.  Mouth/Throat: Oropharynx is clear and moist.  Eyes: Conjunctivae are normal. Right eye exhibits no discharge. Left eye exhibits no discharge.  Neck: Normal range of motion. Neck supple. No thyromegaly present.  Cardiovascular: Normal rate, regular rhythm and normal heart sounds.   No murmur heard. Pulmonary/Chest: Effort normal and breath sounds normal. She has no wheezes.  Abdominal: She exhibits no distension and no mass.  Musculoskeletal: She exhibits no edema.  Lymphadenopathy:    She has no cervical adenopathy.  Neurological: She is alert and oriented to person, place, and time. No cranial nerve deficit. Gait normal.  Skin: Skin is warm and dry. No rash noted. She is not diaphoretic.  Psychiatric:  Memory, affect and judgment normal.       Assessment & Plan  HYPERTENSION Well controlled on current meds. No changes  DIABETES MELLITUS, TYPE II Poor control when hgba1c is checked. encouraged avoidance of all carbs. Will have her increase her Metformin to 2500 mg daily til she returns and then likely start her on a DDP4 when she returns.  TRANSAMINASES, SERUM, ELEVATED Will monitor  HYPERLIPIDEMIA Adequately controlled on current meds no changes  GERD Avoid offending foods and use Nexium and Ranitidine  Chest pain Patient notes intermittent episodes of chest pains with palpitations, diaphoresis at times. None today. Referred to cardiology and advised to seek immediate medical care if she  develops persistent chest pain that is not responsive to NTG

## 2012-01-06 NOTE — Assessment & Plan Note (Signed)
Patient notes intermittent episodes of chest pains with palpitations, diaphoresis at times. None today. Referred to cardiology and advised to seek immediate medical care if she develops persistent chest pain that is not responsive to NTG

## 2012-01-14 ENCOUNTER — Encounter: Payer: Self-pay | Admitting: Family Medicine

## 2012-01-14 ENCOUNTER — Ambulatory Visit (INDEPENDENT_AMBULATORY_CARE_PROVIDER_SITE_OTHER): Payer: BC Managed Care – PPO | Admitting: Family Medicine

## 2012-01-14 VITALS — BP 117/80 | HR 73 | Temp 97.8°F | Ht 62.5 in | Wt 202.8 lb

## 2012-01-14 DIAGNOSIS — R079 Chest pain, unspecified: Secondary | ICD-10-CM

## 2012-01-14 DIAGNOSIS — R7401 Elevation of levels of liver transaminase levels: Secondary | ICD-10-CM

## 2012-01-14 DIAGNOSIS — E119 Type 2 diabetes mellitus without complications: Secondary | ICD-10-CM

## 2012-01-14 DIAGNOSIS — I10 Essential (primary) hypertension: Secondary | ICD-10-CM

## 2012-01-14 DIAGNOSIS — R7402 Elevation of levels of lactic acid dehydrogenase (LDH): Secondary | ICD-10-CM

## 2012-01-14 DIAGNOSIS — K219 Gastro-esophageal reflux disease without esophagitis: Secondary | ICD-10-CM

## 2012-01-14 MED ORDER — METFORMIN HCL 1000 MG PO TABS: 1000.0000 mg | ORAL_TABLET | Freq: Two times a day (BID) | ORAL | Status: DC

## 2012-01-14 MED ORDER — LIRAGLUTIDE 18 MG/3ML ~~LOC~~ SOLN: 0.6000 mg | Freq: Every day | SUBCUTANEOUS | Status: DC

## 2012-01-14 MED ORDER — INSULIN PEN NEEDLE 32G X 5 MM MISC: Status: DC

## 2012-01-14 NOTE — Assessment & Plan Note (Signed)
Well controlled today no changes to meds 

## 2012-01-14 NOTE — Progress Notes (Signed)
Patient ID: [name], female   DOB: 1950/09/22, 61 y.o.   MRN: [id number] [name] [id number] 08/05/1950 01/14/2012      Progress Note-Follow Up  Subjective  Chief Complaint  Chief Complaint  Patient presents with  . Follow-up    2 week on Blood Sugar    HPI  61 year old American female who is in today to discuss her worsening diabetes. She brings her sugar log which do show that most of her blood sugars are above 150. The lowest and 130s but she's had most numbers actually over 200. Acknowledges fatigue, polyuria polydipsia and some abdominal bloating. Denies any persistent pain. Abdominal pain and chest pain are improved. No shortness of breath. She does have a mild a hard spot where she had a tetanus shot at her last visit but it is not painful and it was never red. No other acute complaints at this time. She says she's tried to eat better and was taking extra 500 with metformin since we asked her to. Unfortunately her blood sugars remain elevated.  Past Medical History  Diagnosis Date  . Diaphragmatic hernia without mention of obstruction or gangrene   . Stricture and stenosis of esophagus   . Nonspecific abnormal results of liver function study   . Plantar fascial fibromatosis   . Otalgia, unspecified   . Esophageal reflux   . Unspecified sleep apnea   . Hyperlipidemia   . Diabetes mellitus 52    type 2  . Hypertension 52  . Allergy     seasonal  . Endometriosis   . Arthritis     knee-left  . Depression     job/stress related  . Chest pain 01/06/2012    Past Surgical History  Procedure Date  . Appendectomy   . Benigh cyst in lung 2006    right  . Colonoscopy   . Polypectomy     hyperplastic  . Upper gastrointestinal endoscopy   . Abdominal hysterectomy 1982    partial  . Tubal ligation   . Seba     Family History  Problem Relation Age of Onset  . Breast cancer Mother   . Cancer Mother     breast  . Hypertension Mother   . Diabetes  Mother     type 2  . Heart disease Father   . Hyperlipidemia Father   . Hypertension Father   . Heart attack Father   . Diabetes Brother     type 2  . Heart disease Maternal Grandmother   . Heart attack Maternal Grandfather   . Hypertension Maternal Grandfather   . Stroke Paternal Grandmother   . Hypertension Sister   . Hypertension Brother     History   Social History  . Marital Status: Married    Spouse Name: Marcial Pacas    Number of Children: N/A  . Years of Education: N/A   Occupational History  . Engineer, mining    Social History Main Topics  . Smoking status: Never Smoker   . Smokeless tobacco: Never Used  . Alcohol Use: No  . Drug Use: No  . Sexually Active: Yes   Other Topics Concern  . Not on file   Social History Narrative  . No narrative on file    Current Outpatient Prescriptions on File Prior to Visit  Medication Sig Dispense Refill  . aspirin 81 MG EC tablet Take 1 tablet (81 mg total) by mouth daily. Swallow whole.  30 tablet  12  . bisoprolol-hydrochlorothiazide (  ZIAC) 5-6.25 MG per tablet Take 1 tablet by mouth daily.  30 tablet  6  . esomeprazole (NEXIUM) 40 MG capsule Take 1 capsule (40 mg total) by mouth daily before breakfast.  30 capsule  5  . glucose blood (BAYER CONTOUR TEST) test strip Use as instructed  100 each  12  . LIPITOR 10 MG tablet TAKE 1 TABLET BY MOUTH EVERY DAY  30 tablet  6  . metFORMIN (GLUCOPHAGE) 1000 MG tablet Take by mouth. Takes 2 1/2 tab      . Liraglutide (VICTOZA) 18 MG/3ML SOLN Inject 0.1 mLs (0.6 mg total) into the skin daily.  6 mL  0  . nitroGLYCERIN (NITROSTAT) 0.4 MG SL tablet Place 1 tablet (0.4 mg total) under the tongue every 5 (five) minutes as needed for chest pain. After 3 doses proceed to ER for further evaluation if now resolution  25 tablet  1    Allergies  Allergen Reactions  . Amoxicillin     REACTION: rash  . Codeine     Review of Systems  Review of Systems  Constitutional: Positive for  malaise/fatigue. Negative for fever.  HENT: Negative for congestion.   Eyes: Negative for discharge.  Respiratory: Negative for shortness of breath.   Cardiovascular: Negative for chest pain, palpitations and leg swelling.  Gastrointestinal: Negative for nausea, abdominal pain and diarrhea.  Genitourinary: Negative for dysuria.  Musculoskeletal: Negative for falls.  Skin: Negative for rash.  Neurological: Negative for loss of consciousness and headaches.  Endo/Heme/Allergies: Negative for polydipsia.  Psychiatric/Behavioral: Negative for depression and suicidal ideas. The patient is not nervous/anxious and does not have insomnia.     Objective  BP 117/80  Pulse 73  Temp 97.8 F (36.6 C) (Temporal)  Ht 5' 2.5" (1.588 m)  Wt 202 lb 12.8 oz (91.989 kg)  BMI 36.50 kg/m2  SpO2 98%  Physical Exam  Physical Exam  Constitutional: She is oriented to person, place, and time and well-developed, well-nourished, and in no distress. No distress.  HENT:  Head: Normocephalic and atraumatic.  Eyes: Conjunctivae are normal.  Neck: Neck supple. No thyromegaly present.  Cardiovascular: Normal rate, regular rhythm and normal heart sounds.   No murmur heard. Pulmonary/Chest: Effort normal and breath sounds normal. She has no wheezes.  Abdominal: She exhibits no distension and no mass.  Musculoskeletal: She exhibits no edema.  Lymphadenopathy:    She has no cervical adenopathy.  Neurological: She is alert and oriented to person, place, and time.  Skin: Skin is warm and dry. No rash noted. She is not diaphoretic.  Psychiatric: Memory, affect and judgment normal.    Lab Results  Component Value Date   TSH 0.75 01/03/2012   Lab Results  Component Value Date   WBC 6.2 01/03/2012   HGB 12.7 01/03/2012   HCT 39.1 01/03/2012   MCV 80.9 01/03/2012   PLT 245.0 01/03/2012   Lab Results  Component Value Date   CREATININE 0.8 01/03/2012   BUN 12 01/03/2012   NA 137 01/03/2012   K 3.8 01/03/2012    CL 98 01/03/2012   CO2 28 01/03/2012   Lab Results  Component Value Date   ALT 46* 01/03/2012   AST 31 01/03/2012   ALKPHOS 88 01/03/2012   BILITOT 0.7 01/03/2012   Lab Results  Component Value Date   CHOL 143 01/03/2012   Lab Results  Component Value Date   HDL 38.60* 01/03/2012   Lab Results  Component Value Date   LDLCALC 82  01/03/2012   Lab Results  Component Value Date   TRIG 114.0 01/03/2012   Lab Results  Component Value Date   CHOLHDL 4 01/03/2012     Assessment & Plan  DIABETES MELLITUS, TYPE II Patient with significant increase in hgba1c is in today to discuss options. Will start with Victoza 0.6 mg daily and increase to 1.2 mg daily in a week as tolerated. Discuss need for healthy fats, lean proteins, complex carbs (1 per meal) and small frequent meals, will consider nutrition consult in future. Continue Metformin at 1000mg  po bid  Chest pain No recurrent episodes but does have an appt with cardiology on 8/7 at 10 oclock due to her risk factors. She agrees to keep it  TRANSAMINASES, SERUM, ELEVATED Mild, improved slightly c/w fatty liver, reiterated need for heart healthy diet and exercise  GERD h pylori, symptoms improved with diet and medicationchanges.  HYPERTENSION Well controlled today no changes to meds

## 2012-01-14 NOTE — Assessment & Plan Note (Signed)
Patient with significant increase in hgba1c is in today to discuss options. Will start with Victoza 0.6 mg daily and increase to 1.2 mg daily in a week as tolerated. Discuss need for healthy fats, lean proteins, complex carbs (1 per meal) and small frequent meals, will consider nutrition consult in future. Continue Metformin at 1000mg  po bid

## 2012-01-14 NOTE — Patient Instructions (Addendum)
Diabetes and Exercise Regular exercise is important and can help:   Control blood glucose (sugar).   Decrease blood pressure.    Control blood lipids (cholesterol, triglycerides).   Improve overall health.  BENEFITS FROM EXERCISE  Improved fitness.   Improved flexibility.   Improved endurance.   Increased bone density.   Weight control.   Increased muscle strength.   Decreased body fat.   Improvement of the body's use of insulin, a hormone.   Increased insulin sensitivity.   Reduction of insulin needs.   Reduced stress and tension.   Helps you feel better.  People with diabetes who add exercise to their lifestyle gain additional benefits, including:  Weight loss.   Reduced appetite.   Improvement of the body's use of blood glucose.   Decreased risk factors for heart disease:   Lowering of cholesterol and triglycerides.   Raising the level of good cholesterol (high-density lipoproteins, HDL).   Lowering blood sugar.   Decreased blood pressure.  TYPE 1 DIABETES AND EXERCISE  Exercise will usually lower your blood glucose.   If blood glucose is greater than 240 mg/dl, check urine ketones. If ketones are present, do not exercise.   Location of the insulin injection sites may need to be adjusted with exercise. Avoid injecting insulin into areas of the body that will be exercised. For example, avoid injecting insulin into:   The arms when playing tennis.   The legs when jogging. For more information, discuss this with your caregiver.   Keep a record of:   Food intake.   Type and amount of exercise.   Expected peak times of insulin action.   Blood glucose levels.  Do this before, during, and after exercise. Review your records with your caregiver. This will help you to develop guidelines for adjusting food intake and insulin amounts.  TYPE 2 DIABETES AND EXERCISE  Regular physical activity can help control blood glucose.   Exercise is important  because it may:   Increase the body's sensitivity to insulin.   Improve blood glucose control.   Exercise reduces the risk of heart disease. It decreases serum cholesterol and triglycerides. It also lowers blood pressure.   Those who take insulin or oral hypoglycemic agents should watch for signs of hypoglycemia. These signs include dizziness, shaking, sweating, chills, and confusion.   Body water is lost during exercise. It must be replaced. This will help to avoid loss of body fluids (dehydration) or heat stroke.  Be sure to talk to your caregiver before starting an exercise program to make sure it is safe for you. Remember, any activity is better than none.  Document Released: 08/21/2003 Document Revised: 05/20/2011 Document Reviewed: 12/05/2008 [facility] Patient Information 2012 [place], [place].   Victoza start with 0.6 mg injection daily, after 7 days increase to 1.2 mg dose daily til seen [employer] caps 1 daily, MegaRed 1 cap daily by [employer] is a good option, check for coupons on website

## 2012-01-14 NOTE — Assessment & Plan Note (Signed)
No recurrent episodes but does have an appt with cardiology on 8/7 at 10 oclock due to her risk factors. She agrees to keep it

## 2012-01-14 NOTE — Assessment & Plan Note (Signed)
h pylori, symptoms improved with diet and medicationchanges.

## 2012-01-14 NOTE — Assessment & Plan Note (Signed)
Mild, improved slightly c/w fatty liver, reiterated need for heart healthy diet and exercise

## 2012-01-18 ENCOUNTER — Ambulatory Visit: Payer: BC Managed Care – PPO | Admitting: Family Medicine

## 2012-01-19 ENCOUNTER — Ambulatory Visit (INDEPENDENT_AMBULATORY_CARE_PROVIDER_SITE_OTHER): Payer: BC Managed Care – PPO | Admitting: Cardiology

## 2012-01-19 ENCOUNTER — Encounter: Payer: Self-pay | Admitting: Cardiology

## 2012-01-19 VITALS — BP 120/80 | HR 74 | Ht 62.0 in | Wt 204.0 lb

## 2012-01-19 DIAGNOSIS — R079 Chest pain, unspecified: Secondary | ICD-10-CM

## 2012-01-19 DIAGNOSIS — E785 Hyperlipidemia, unspecified: Secondary | ICD-10-CM

## 2012-01-19 DIAGNOSIS — I10 Essential (primary) hypertension: Secondary | ICD-10-CM

## 2012-01-19 NOTE — Assessment & Plan Note (Signed)
Blood pressure controlled. Continue present medications. 

## 2012-01-19 NOTE — Assessment & Plan Note (Signed)
Chest pain is atypical. However she has multiple risk factors including diabetes, hypertension, hyperlipidemia and family history of coronary disease. She also has dyspnea on exertion. She also was noted to have coronary atherosclerosis on her chest CT in February of 2013. I will plan a stress Myoview for risk stratification. If no ischemia plan to continue medical therapy including aspirin, beta blocker and statin. We discussed risk factor modification.

## 2012-01-19 NOTE — Patient Instructions (Addendum)
Your physician recommends that you schedule a follow-up appointment in: AS NEEDED PENDING TEST RESULTS  Your physician has requested that you have en exercise stress myoview. For further information please visit [email or web address]. Please follow instruction sheet, as given.    

## 2012-01-19 NOTE — Assessment & Plan Note (Signed)
Continue statin. 

## 2012-01-19 NOTE — Progress Notes (Signed)
HPI: 61 yo female with no prior cardiac history for evaluation of chest pain. Note CTA of chest in Feb 2013 showed coronary atherosclerosis. Approximately 2 weeks ago the patient had pain under her left breast radiating to her back. It would last for several seconds and resolve spontaneously. It recurred over several days but she has had none in the past 2 weeks. There is no associated symptoms. She does not have exertional chest pain. She notes dyspnea with more extreme activities but not routine activities. No orthopnea, PND or pedal edema. Because of the above we were asked to evaluate.  Current Outpatient Prescriptions  Medication Sig Dispense Refill  . aspirin 81 MG EC tablet Take 1 tablet (81 mg total) by mouth daily. Swallow whole.  30 tablet  12  . bisoprolol-hydrochlorothiazide (ZIAC) 5-6.25 MG per tablet Take 1 tablet by mouth daily.  30 tablet  6  . esomeprazole (NEXIUM) 40 MG capsule Take 1 capsule (40 mg total) by mouth daily before breakfast.  30 capsule  5  . glucose blood (BAYER CONTOUR TEST) test strip Use as instructed  100 each  12  . Insulin Pen Needle (NOVOTWIST) 32G X 5 MM MISC daily  100 each  2  . LIPITOR 10 MG tablet TAKE 1 TABLET BY MOUTH EVERY DAY  30 tablet  6  . Liraglutide (VICTOZA) 18 MG/3ML SOLN Inject 0.1 mLs (0.6 mg total) into the skin daily.  6 mL  0  . metFORMIN (GLUCOPHAGE) 1000 MG tablet Take 1 tablet (1,000 mg total) by mouth 2 (two) times daily with a meal. Takes 2 1/2 tab      . nitroGLYCERIN (NITROSTAT) 0.4 MG SL tablet Place 1 tablet (0.4 mg total) under the tongue every 5 (five) minutes as needed for chest pain. After 3 doses proceed to ER for further evaluation if now resolution  25 tablet  1  . Omega-3 Fatty Acids (FISH OIL PO) Take 1 tablet by mouth daily.      . ranitidine (ZANTAC) 150 MG capsule Take 150 mg by mouth as needed.        Allergies  Allergen Reactions  . Amoxicillin     REACTION: rash  . Codeine     Past Medical History    Diagnosis Date  . Diaphragmatic hernia without mention of obstruction or gangrene   . Stricture and stenosis of esophagus   . Nonspecific abnormal results of liver function study   . Plantar fascial fibromatosis   . Esophageal reflux   . Unspecified sleep apnea   . Hyperlipidemia   . Diabetes mellitus 52    type 2  . Hypertension 52  . Allergy     seasonal  . Endometriosis   . Arthritis     knee-left  . Depression     job/stress related    Past Surgical History  Procedure Date  . Appendectomy   . Benigh cyst in lung 2006    right  . Colonoscopy   . Polypectomy     hyperplastic  . Upper gastrointestinal endoscopy   . Abdominal hysterectomy 1982    partial  . Tubal ligation   . Seba     History   Social History  . Marital Status: Married    Spouse Name: Marcial Pacas    Number of Children: 2  . Years of Education: N/A   Occupational History  . Engineer, mining    Social History Main Topics  . Smoking status: Never Smoker   . Smokeless  tobacco: Never Used  . Alcohol Use: No  . Drug Use: No  . Sexually Active: Yes   Other Topics Concern  . Not on file   Social History Narrative  . No narrative on file    Family History  Problem Relation Age of Onset  . Breast cancer Mother   . Cancer Mother     breast  . Hypertension Mother   . Diabetes Mother     type 2  . Heart disease Father   . Hyperlipidemia Father   . Hypertension Father   . Heart attack Father     MI at age 27  . Diabetes Brother     type 2  . Heart disease Maternal Grandmother   . Heart attack Maternal Grandfather   . Hypertension Maternal Grandfather   . Stroke Paternal Grandmother   . Hypertension Sister   . Hypertension Brother     ROS: occasional cough attributed to allergies but no fevers or chills, hemoptysis, dysphasia, odynophagia, melena, hematochezia, dysuria, hematuria, rash, seizure activity, orthopnea, PND, pedal edema, claudication. Remaining systems are  negative.  Physical Exam:   Blood pressure 120/80, pulse 74, height 5\' 2"  (1.575 m), weight 204 lb (92.534 kg).  General:  Well developed/obese in NAD Skin warm/dry Patient not depressed No peripheral clubbing Back-normal HEENT-normal/normal eyelids Neck supple/normal carotid upstroke bilaterally; no bruits; no JVD; no thyromegaly chest - CTA/ normal expansion CV - RRR/normal S1 and S2; no murmurs, rubs or gallops;  PMI nondisplaced Abdomen -NT/ND, no HSM, no mass, + bowel sounds, no bruit 2+ femoral pulses, no bruits Ext-no edema, chords, 2+ DP Neuro-grossly nonfocal  ECG 01/03/12 - Sinus bradycardia, RBBB, LAFB

## 2012-01-26 ENCOUNTER — Encounter: Payer: Self-pay | Admitting: Family Medicine

## 2012-01-26 ENCOUNTER — Ambulatory Visit (HOSPITAL_COMMUNITY): Payer: BC Managed Care – PPO | Attending: Cardiovascular Disease | Admitting: Radiology

## 2012-01-26 VITALS — BP 131/74 | HR 85 | Ht 62.0 in | Wt 204.0 lb

## 2012-01-26 DIAGNOSIS — R9439 Abnormal result of other cardiovascular function study: Secondary | ICD-10-CM

## 2012-01-26 DIAGNOSIS — R0789 Other chest pain: Secondary | ICD-10-CM | POA: Insufficient documentation

## 2012-01-26 DIAGNOSIS — Z794 Long term (current) use of insulin: Secondary | ICD-10-CM | POA: Insufficient documentation

## 2012-01-26 DIAGNOSIS — R079 Chest pain, unspecified: Secondary | ICD-10-CM

## 2012-01-26 DIAGNOSIS — I10 Essential (primary) hypertension: Secondary | ICD-10-CM | POA: Insufficient documentation

## 2012-01-26 DIAGNOSIS — R0609 Other forms of dyspnea: Secondary | ICD-10-CM | POA: Insufficient documentation

## 2012-01-26 DIAGNOSIS — E669 Obesity, unspecified: Secondary | ICD-10-CM | POA: Insufficient documentation

## 2012-01-26 DIAGNOSIS — R0989 Other specified symptoms and signs involving the circulatory and respiratory systems: Secondary | ICD-10-CM | POA: Insufficient documentation

## 2012-01-26 DIAGNOSIS — I4510 Unspecified right bundle-branch block: Secondary | ICD-10-CM

## 2012-01-26 DIAGNOSIS — R5381 Other malaise: Secondary | ICD-10-CM | POA: Insufficient documentation

## 2012-01-26 DIAGNOSIS — I2510 Atherosclerotic heart disease of native coronary artery without angina pectoris: Secondary | ICD-10-CM | POA: Insufficient documentation

## 2012-01-26 DIAGNOSIS — E119 Type 2 diabetes mellitus without complications: Secondary | ICD-10-CM | POA: Insufficient documentation

## 2012-01-26 DIAGNOSIS — Z8249 Family history of ischemic heart disease and other diseases of the circulatory system: Secondary | ICD-10-CM | POA: Insufficient documentation

## 2012-01-26 MED ORDER — TECHNETIUM TC 99M TETROFOSMIN IV KIT
11.0000 | PACK | Freq: Once | INTRAVENOUS | Status: AC | PRN
  Administered 2012-01-26: 11 via INTRAVENOUS

## 2012-01-26 MED ORDER — TECHNETIUM TC 99M TETROFOSMIN IV KIT
33.0000 | PACK | Freq: Once | INTRAVENOUS | Status: AC | PRN
  Administered 2012-01-26: 33 via INTRAVENOUS

## 2012-01-26 NOTE — Progress Notes (Signed)
[facility] SITE 3 NUCLEAR MED [address] [place] [address] [phone]  Cardiology Nuclear Med Study  [name] is a 61 y.o. female     MRN : [id number]     DOB: 25-Aug-1950  Procedure Date: 01/26/2012  Nuclear Med Background Indication for Stress Test:  Evaluation for Ischemia History:  '07 OZH:YQMVHQ, EF=74%; '10 Echo:EF=55-65%, mild LVH; 2/13 IO:[id number] Artherosclerosis. Cardiac Risk Factors: Family History - CAD, Hypertension, IDDM Type 2, Obesity and RBBB.  Symptoms:  Chest Pressure>Back.  (last episode of chest discomfort was about 2 weeks ago), DOE and Fatigue   Nuclear Pre-Procedure Caffeine/Decaff Intake:  None NPO After: 8:00pm   Lungs:  Clear. O2 Sat: 96% on room air. IV 0.9% NS with Angio Cath:  22g  IV Site: R Hand  IV Started by:  Bonnita Levan, RN  Chest Size (in):  42 Cup Size: DD  Height: 5\' 2"  (1.575 m)  Weight:  204 lb (92.534 kg)  BMI:  Body mass index is 37.31 kg/(m^2). Tech Comments:  Ziac held x 24 hours, BS @ 6:30 AM= 127    Nuclear Med Study 1 or 2 day study: 1 day  Stress Test Type:  Stress  Reading MD: Kristeen Miss, MD  Order Authorizing Provider:  Olga Millers, MD  Resting Radionuclide: Technetium 50m Tetrofosmin  Resting Radionuclide Dose: 11.0 mCi   Stress Radionuclide:  Technetium 61m Tetrofosmin  Stress Radionuclide Dose: 33.0 mCi           Stress Protocol Rest HR: 85 Stress HR: 150  Rest BP: 131/74 Stress BP: 209/97  Exercise Time (min): 6:30 METS: 7.0   Predicted Max HR: 161 bpm % Max HR: 93.17 bpm Rate Pressure Product: 13244   Dose of Adenosine (mg):  n/a Dose of Lexiscan: n/a mg  Dose of Atropine (mg): n/a Dose of Dobutamine: n/a mcg/kg/min (at max HR)  Stress Test Technologist: Smiley Houseman, CMA-N  Nuclear Technologist:  Domenic Polite, CNMT     Rest Procedure:  Myocardial perfusion imaging was performed at rest 45 minutes following the intravenous administration of Technetium 58m  Tetrofosmin.  Rest ECG: LAFB and RBBB.  Stress Procedure:  The patient performed treadmill exercise using a Bruce  Protocol for 6:30 minutes. The patient stopped due to fatigue and denied any chest pain.  There were no diagnostic ST-T wave changes. She did have a hypertensive response to exercise, 209/97.  Technetium 83m Tetrofosmin was injected at peak exercise and myocardial perfusion imaging was performed after a brief delay.  Stress ECG: No significant change from baseline ECG  QPS Raw Data Images:  Normal; no motion artifact; normal heart/lung ratio. Stress Images:  Normal homogeneous uptake in all areas of the myocardium. Rest Images:  Normal homogeneous uptake in all areas of the myocardium. Subtraction (SDS):  No evidence of ischemia. Transient Ischemic Dilatation (Normal <1.22):  0.89 Lung/Heart Ratio (Normal <0.45):  0.29  Quantitative Gated Spect Images QGS EDV:  45 ml QGS ESV:  10 ml  Impression Exercise Capacity:  Good exercise capacity. BP Response:  Hypertensive blood pressure response. Clinical Symptoms:  No significant symptoms noted. ECG Impression:  No significant ST segment change suggestive of ischemia. Comparison with Prior Nuclear Study: No images to compare  Overall Impression:  Normal stress nuclear study.  No evidence of ischemia.  Normal LV function  LV Ejection Fraction: 78%.  LV Wall Motion:  NL LV Function; NL Wall Motion.    Vesta Mixer, Montez Hageman., MD, [facility] 01/26/2012,  5:42 PM Office - [phone] Pager [phone]

## 2012-01-28 ENCOUNTER — Telehealth: Payer: Self-pay | Admitting: *Deleted

## 2012-01-28 ENCOUNTER — Ambulatory Visit: Payer: BC Managed Care – PPO | Admitting: Cardiovascular Disease

## 2012-01-28 NOTE — Telephone Encounter (Signed)
Message copied by Burnell Blanks on Fri Jan 28, 2012 10:13 AM ------      Message from: Lewayne Bunting      Created: Thu Jan 27, 2012 12:06 PM       Hazle Coca

## 2012-01-28 NOTE — Telephone Encounter (Signed)
Advised of stress test result

## 2012-02-15 ENCOUNTER — Other Ambulatory Visit: Payer: Self-pay

## 2012-02-15 MED ORDER — BISOPROLOL-HYDROCHLOROTHIAZIDE 5-6.25 MG PO TABS: 1.0000 | ORAL_TABLET | Freq: Every day | ORAL | Status: DC

## 2012-02-17 ENCOUNTER — Ambulatory Visit (HOSPITAL_BASED_OUTPATIENT_CLINIC_OR_DEPARTMENT_OTHER)
Admission: RE | Admit: 2012-02-17 | Discharge: 2012-02-17 | Disposition: A | Discharge status: Home / Self Care | Payer: BC Managed Care – PPO | Source: Ambulatory Visit | Attending: Family Medicine | Admitting: Family Medicine

## 2012-02-17 ENCOUNTER — Ambulatory Visit (INDEPENDENT_AMBULATORY_CARE_PROVIDER_SITE_OTHER): Payer: BC Managed Care – PPO | Admitting: Family Medicine

## 2012-02-17 ENCOUNTER — Encounter: Payer: Self-pay | Admitting: Family Medicine

## 2012-02-17 VITALS — BP 120/80 | HR 92 | Temp 97.4°F | Ht 62.5 in | Wt 204.8 lb

## 2012-02-17 DIAGNOSIS — R1310 Dysphagia, unspecified: Secondary | ICD-10-CM

## 2012-02-17 DIAGNOSIS — L989 Disorder of the skin and subcutaneous tissue, unspecified: Secondary | ICD-10-CM

## 2012-02-17 DIAGNOSIS — Z23 Encounter for immunization: Secondary | ICD-10-CM

## 2012-02-17 DIAGNOSIS — R229 Localized swelling, mass and lump, unspecified: Secondary | ICD-10-CM | POA: Insufficient documentation

## 2012-02-17 DIAGNOSIS — E119 Type 2 diabetes mellitus without complications: Secondary | ICD-10-CM

## 2012-02-17 DIAGNOSIS — I10 Essential (primary) hypertension: Secondary | ICD-10-CM

## 2012-02-17 DIAGNOSIS — E785 Hyperlipidemia, unspecified: Secondary | ICD-10-CM

## 2012-02-17 DIAGNOSIS — J029 Acute pharyngitis, unspecified: Secondary | ICD-10-CM

## 2012-02-17 MED ORDER — AZITHROMYCIN 250 MG PO TABS: ORAL_TABLET | ORAL | Status: AC

## 2012-02-17 NOTE — Patient Instructions (Addendum)

## 2012-02-18 ENCOUNTER — Encounter: Payer: Self-pay | Admitting: Family Medicine

## 2012-02-18 DIAGNOSIS — J029 Acute pharyngitis, unspecified: Secondary | ICD-10-CM

## 2012-02-18 HISTORY — DX: Acute pharyngitis, unspecified: J02.9

## 2012-02-18 NOTE — Assessment & Plan Note (Signed)
Numbers greatly improved. She's reporting blood sugars in the 80-110 range consistently in the morning. She says she feels much better. Clearheaded more energized and is pleased with her progress. We'll check a hemoglobin A1c with next visit no change in medications at this time

## 2012-02-18 NOTE — Assessment & Plan Note (Signed)
Well controlled, no changes to

## 2012-02-18 NOTE — Assessment & Plan Note (Signed)
Tolerating Lipitor, add  Megared. Check FLP with next visit

## 2012-02-18 NOTE — Assessment & Plan Note (Signed)
2 days with his symptoms and no concerns on exam. Rapid strep is negative. She's given a prescription for an antibiotic to hold onto. She is advised to use if she develops high-grade fevers, worsening pharyngitis or thick green sputum. Call if she has any questions. Increase fluids and rest

## 2012-02-18 NOTE — Progress Notes (Signed)
Patient ID: [name], female   DOB: Feb 19, 1951, 61 y.o.   MRN: [id number] LEONARD HENDLER [id number] 08-11-1950 02/18/2012      Progress Note-Follow Up  Subjective  Chief Complaint  Chief Complaint  Patient presents with  . Follow-up    1 month  . Sore Throat    X 2 days    HPI  Patient is a 61 year old American female who is in today for followup and is complaining of 2 days worth of some sore throat. She does note she's had some low-grade headache as well as some low-grade fevers as well. She denies any chest pain, chest congestion, cough, ear pain, chills. She is not taking any GI or GU concerns today. Overall she does feel she's doing well. She did notice some mild discomfort in her neck recently. She says that the cyst on her right anterior neck for about 5 years but more recently has had a small sense of discomfort when she swallows and fullness in that region. No actual nausea, vomiting or gagging. Her sugars are greatly improved. She notes her blood sugars are ranging from 80-1 10 in the morning and she feels better overall. She denies fatigue, malaise and says even her thought feel clearer.  Past Medical History  Diagnosis Date  . Diaphragmatic hernia without mention of obstruction or gangrene   . Stricture and stenosis of esophagus   . Nonspecific abnormal results of liver function study   . Plantar fascial fibromatosis   . Esophageal reflux   . Unspecified sleep apnea   . Hyperlipidemia   . Diabetes mellitus 52    type 2  . Hypertension 52  . Allergy     seasonal  . Endometriosis   . Arthritis     knee-left  . Depression     job/stress related  . Pharyngitis 02/18/2012    Past Surgical History  Procedure Date  . Appendectomy   . Benigh cyst in lung 2006    right  . Colonoscopy   . Polypectomy     hyperplastic  . Upper gastrointestinal endoscopy   . Abdominal hysterectomy 1982    partial  . Tubal ligation   . Seba     Family History  Problem  Relation Age of Onset  . Breast cancer Mother   . Cancer Mother     breast  . Hypertension Mother   . Diabetes Mother     type 2  . Heart disease Father   . Hyperlipidemia Father   . Hypertension Father   . Heart attack Father     MI at age 105  . Diabetes Brother     type 2  . Heart disease Maternal Grandmother   . Heart attack Maternal Grandfather   . Hypertension Maternal Grandfather   . Stroke Paternal Grandmother   . Hypertension Sister   . Hypertension Brother     History   Social History  . Marital Status: Married    Spouse Name: Marcial Pacas    Number of Children: 2  . Years of Education: N/A   Occupational History  . Engineer, mining    Social History Main Topics  . Smoking status: Never Smoker   . Smokeless tobacco: Never Used  . Alcohol Use: No  . Drug Use: No  . Sexually Active: Yes   Other Topics Concern  . Not on file   Social History Narrative  . No narrative on file    Current Outpatient Prescriptions on File Prior  to Visit  Medication Sig Dispense Refill  . aspirin 81 MG EC tablet Take 1 tablet (81 mg total) by mouth daily. Swallow whole.  30 tablet  12  . bisoprolol-hydrochlorothiazide (ZIAC) 5-6.25 MG per tablet Take 1 tablet by mouth daily.  30 tablet  6  . esomeprazole (NEXIUM) 40 MG capsule Take 1 capsule (40 mg total) by mouth daily before breakfast.  30 capsule  5  . glucose blood (BAYER CONTOUR TEST) test strip Use as instructed  100 each  12  . Insulin Pen Needle (NOVOTWIST) 32G X 5 MM MISC daily  100 each  2  . LIPITOR 10 MG tablet TAKE 1 TABLET BY MOUTH EVERY DAY  30 tablet  6  . Liraglutide (VICTOZA) 18 MG/3ML SOLN Inject 0.1 mLs (0.6 mg total) into the skin daily.  6 mL  0  . metFORMIN (GLUCOPHAGE) 1000 MG tablet Take 1 tablet (1,000 mg total) by mouth 2 (two) times daily with a meal. Takes 2 1/2 tab      . Omega-3 Fatty Acids (FISH OIL PO) Take 1 tablet by mouth daily.      . ranitidine (ZANTAC) 150 MG capsule Take 150 mg by mouth as  needed.      . nitroGLYCERIN (NITROSTAT) 0.4 MG SL tablet Place 1 tablet (0.4 mg total) under the tongue every 5 (five) minutes as needed for chest pain. After 3 doses proceed to ER for further evaluation if now resolution  25 tablet  1    Allergies  Allergen Reactions  . Amoxicillin     REACTION: rash  . Codeine     Review of Systems  Review of Systems  Constitutional: Positive for fever and malaise/fatigue.  HENT: Positive for sore throat. Negative for congestion.   Eyes: Negative for discharge.  Respiratory: Negative for shortness of breath.   Cardiovascular: Negative for chest pain, palpitations and leg swelling.  Gastrointestinal: Negative for nausea, abdominal pain and diarrhea.  Genitourinary: Negative for dysuria.  Musculoskeletal: Negative for falls.  Skin: Negative for rash.  Neurological: Negative for loss of consciousness and headaches.  Endo/Heme/Allergies: Negative for polydipsia.  Psychiatric/Behavioral: Negative for depression and suicidal ideas. The patient is not nervous/anxious and does not have insomnia.     Objective  BP 120/80  Pulse 92  Temp 97.4 F (36.3 C) (Temporal)  Ht 5' 2.5" (1.588 m)  Wt 204 lb 12.8 oz (92.897 kg)  BMI 36.86 kg/m2  SpO2 92%  Physical Exam  Physical Exam  Constitutional: She is well-developed, well-nourished, and in no distress. No distress.  HENT:  Left Ear: External ear normal.  Mouth/Throat: No oropharyngeal exudate.  Eyes: EOM are normal. Left eye exhibits no discharge. No scleral icterus.  Neck: No JVD present. No tracheal deviation present.  Cardiovascular: Normal heart sounds and intact distal pulses.   Pulmonary/Chest: No respiratory distress. She has no rales.  Abdominal: She exhibits no distension and no mass. There is tenderness. There is no guarding.  Musculoskeletal: She exhibits no edema and no tenderness.  Lymphadenopathy:    She has no cervical adenopathy.  Skin: No rash noted. No erythema.    Lab  Results  Component Value Date   TSH 0.75 01/03/2012   Lab Results  Component Value Date   WBC 6.2 01/03/2012   HGB 12.7 01/03/2012   HCT 39.1 01/03/2012   MCV 80.9 01/03/2012   PLT 245.0 01/03/2012   Lab Results  Component Value Date   CREATININE 0.8 01/03/2012   BUN 12  01/03/2012   NA 137 01/03/2012   K 3.8 01/03/2012   CL 98 01/03/2012   CO2 28 01/03/2012   Lab Results  Component Value Date   ALT 46* 01/03/2012   AST 31 01/03/2012   ALKPHOS 88 01/03/2012   BILITOT 0.7 01/03/2012   Lab Results  Component Value Date   CHOL 143 01/03/2012   Lab Results  Component Value Date   HDL 38.60* 01/03/2012   Lab Results  Component Value Date   LDLCALC 82 01/03/2012   Lab Results  Component Value Date   TRIG 114.0 01/03/2012   Lab Results  Component Value Date   CHOLHDL 4 01/03/2012     Assessment & Plan  DIABETES MELLITUS, TYPE II Numbers greatly improved. She's reporting blood sugars in the 80-110 range consistently in the morning. She says she feels much better. Clearheaded more energized and is pleased with her progress. We'll check a hemoglobin A1c with next visit no change in medications at this time  HYPERTENSION Well controlled, no changes to  Pharyngitis 2 days with his symptoms and no concerns on exam. Rapid strep is negative. She's given a prescription for an antibiotic to hold onto. She is advised to use if she develops high-grade fevers, worsening pharyngitis or thick green sputum. Call if she has any questions. Increase fluids and rest  HYPERLIPIDEMIA Tolerating Lipitor, add  Megared. Check FLP with next visit

## 2012-02-19 LAB — CULTURE, GROUP A STREP: Organism ID, Bacteria: NORMAL

## 2012-02-23 ENCOUNTER — Other Ambulatory Visit: Payer: Self-pay

## 2012-02-23 MED ORDER — ATORVASTATIN CALCIUM 10 MG PO TABS: 10.0000 mg | ORAL_TABLET | Freq: Every day | ORAL | Status: DC

## 2012-03-06 ENCOUNTER — Other Ambulatory Visit: Payer: Self-pay | Admitting: Family Medicine

## 2012-03-06 DIAGNOSIS — E119 Type 2 diabetes mellitus without complications: Secondary | ICD-10-CM

## 2012-03-06 DIAGNOSIS — L989 Disorder of the skin and subcutaneous tissue, unspecified: Secondary | ICD-10-CM

## 2012-03-06 MED ORDER — LIRAGLUTIDE 18 MG/3ML ~~LOC~~ SOLN: 0.6000 mg | Freq: Every day | SUBCUTANEOUS | Status: DC

## 2012-03-06 NOTE — Telephone Encounter (Signed)
RX sent to pharmacy  

## 2012-03-16 ENCOUNTER — Ambulatory Visit (INDEPENDENT_AMBULATORY_CARE_PROVIDER_SITE_OTHER): Payer: BC Managed Care – PPO | Admitting: General Surgery

## 2012-03-16 ENCOUNTER — Encounter (INDEPENDENT_AMBULATORY_CARE_PROVIDER_SITE_OTHER): Payer: Self-pay | Admitting: General Surgery

## 2012-03-16 VITALS — BP 124/82 | HR 84 | Temp 97.8°F | Resp 14 | Ht 62.5 in | Wt 205.4 lb

## 2012-03-16 DIAGNOSIS — L989 Disorder of the skin and subcutaneous tissue, unspecified: Secondary | ICD-10-CM

## 2012-03-16 NOTE — Progress Notes (Signed)
Subjective:     Patient ID: [name], female   DOB: 1950/09/25, 61 y.o.   MRN: [id number]  HPI This patient is 61 year old female with a right-sided subcutaneous lesion. The patient states that she had some trouble with coughing which she thinks is related to this. Patient was studied with a thyroid ultrasound which revealed subcutaneous lesion consistent with either lymph node or a sebaceous cyst. Patient had no thyroid nodules are seen on ultrasound. Patient does state that she does not have any swallowing issues or troubles with bleeding.  Review of Systems  Constitutional: Negative.   HENT: Negative.   Eyes: Negative.   Cardiovascular: Negative.   Gastrointestinal: Negative.   Genitourinary: Negative.   Neurological: Negative.        Objective:   Physical Exam  Constitutional: She is oriented to person, place, and time. She appears well-developed and well-nourished.  HENT:  Head: Normocephalic and atraumatic.  Eyes: Conjunctivae normal and EOM are normal. Pupils are equal, round, and reactive to light.  Neck: Normal range of motion.    Cardiovascular: Normal rate and regular rhythm.   Pulmonary/Chest: Effort normal and breath sounds normal.  Abdominal: Soft. Bowel sounds are normal.  Musculoskeletal: Normal range of motion.  Neurological: She is alert and oriented to person, place, and time.       Assessment:     This 61 year old female with a right sided skin lesion. Discuss results of the thyroid ultrasound revealed the likelihood that this is noncontributory her coughing spells. She agrees and would like to continue to watch this lesion at this time. She did become somewhat cumbersome patient will return for possible excision of her right index medication.    Plan:     followup when necessary

## 2012-04-11 ENCOUNTER — Other Ambulatory Visit (INDEPENDENT_AMBULATORY_CARE_PROVIDER_SITE_OTHER): Payer: BC Managed Care – PPO

## 2012-04-11 DIAGNOSIS — E119 Type 2 diabetes mellitus without complications: Secondary | ICD-10-CM

## 2012-04-11 DIAGNOSIS — I10 Essential (primary) hypertension: Secondary | ICD-10-CM

## 2012-04-11 DIAGNOSIS — E785 Hyperlipidemia, unspecified: Secondary | ICD-10-CM

## 2012-04-11 LAB — RENAL FUNCTION PANEL
CO2: 27 mEq/L (ref 19–32)
Chloride: 103 mEq/L (ref 96–112)
Creatinine, Ser: 0.9 mg/dL (ref 0.4–1.2)
GFR: 78.23 mL/min (ref >60.00)
Phosphorus: 3.9 mg/dL (ref 2.3–4.6)
Sodium: 140 mEq/L (ref 135–145)

## 2012-04-11 LAB — CBC
HCT: 40 % (ref 36.0–46.0)
Hemoglobin: 12.9 g/dL (ref 12.0–15.0)
Platelets: 254 10*3/uL (ref 150.0–400.0)
RDW: 14.3 % (ref 11.5–14.6)
WBC: 6.7 10*3/uL (ref 4.5–10.5)

## 2012-04-11 LAB — HEPATIC FUNCTION PANEL
AST: 47 U/L — ABNORMAL HIGH (ref 0–37)
Albumin: 3.7 g/dL (ref 3.5–5.2)
Total Protein: 6.9 g/dL (ref 6.0–8.3)

## 2012-04-11 LAB — TSH: TSH: 1.26 u[IU]/mL (ref 0.35–5.50)

## 2012-04-11 LAB — LIPID PANEL
HDL: 37.1 mg/dL — ABNORMAL LOW (ref >39.00)
Total CHOL/HDL Ratio: 4

## 2012-04-11 LAB — HEMOGLOBIN A1C: Hgb A1c MFr Bld: 8 % — ABNORMAL HIGH (ref 4.6–6.5)

## 2012-04-18 ENCOUNTER — Encounter: Payer: Self-pay | Admitting: Family Medicine

## 2012-04-18 ENCOUNTER — Ambulatory Visit (INDEPENDENT_AMBULATORY_CARE_PROVIDER_SITE_OTHER): Payer: BC Managed Care – PPO | Admitting: Family Medicine

## 2012-04-18 VITALS — BP 127/82 | HR 90 | Temp 97.4°F | Ht 62.5 in | Wt 207.4 lb

## 2012-04-18 DIAGNOSIS — E119 Type 2 diabetes mellitus without complications: Secondary | ICD-10-CM

## 2012-04-18 DIAGNOSIS — E785 Hyperlipidemia, unspecified: Secondary | ICD-10-CM

## 2012-04-18 DIAGNOSIS — K219 Gastro-esophageal reflux disease without esophagitis: Secondary | ICD-10-CM

## 2012-04-18 DIAGNOSIS — R05 Cough: Secondary | ICD-10-CM

## 2012-04-18 DIAGNOSIS — K760 Fatty (change of) liver, not elsewhere classified: Secondary | ICD-10-CM

## 2012-04-18 DIAGNOSIS — R053 Chronic cough: Secondary | ICD-10-CM

## 2012-04-18 DIAGNOSIS — I10 Essential (primary) hypertension: Secondary | ICD-10-CM

## 2012-04-18 DIAGNOSIS — R059 Cough, unspecified: Secondary | ICD-10-CM

## 2012-04-18 DIAGNOSIS — K7689 Other specified diseases of liver: Secondary | ICD-10-CM

## 2012-04-18 NOTE — Assessment & Plan Note (Signed)
Good response to atorvastatin, continue the same

## 2012-04-18 NOTE — Patient Instructions (Addendum)
Fatty Liver Fatty liver is the accumulation of fat in liver cells. It is also called hepatosteatosis or steatohepatitis. It is normal for your liver to contain some fat. If fat is more than 5 to 10% of your liver's weight, you have fatty liver.  There are often no symptoms (problems) for years while damage is still occurring. People often learn about their fatty liver when they have medical tests for other reasons. Fat can damage your liver for years or even decades without causing problems. When it becomes severe, it can cause fatigue, weight loss, weakness, and confusion. This makes you more likely to develop more serious liver problems. The liver is the largest organ in the body. It does a lot of work and often gives no warning signs when it is sick until late in a disease. The liver has many important jobs including:  Breaking down foods.  Storing vitamins, iron, and other minerals.  Making proteins.  Making bile for food digestion.  Breaking down many products including medications, alcohol and some poisons. CAUSES  There are a number of different conditions, medications, and poisons that can cause a fatty liver. Eating too many calories causes fat to build up in the liver. Not processing and breaking fats down normally may also cause this. Certain conditions, such as obesity, diabetes, and high triglycerides also cause this. Most fatty liver patients tend to be middle-aged and over weight.  Some causes of fatty liver are:  Alcohol over consumption.  Malnutrition.  Steroid use.  Valproic acid toxicity.  Obesity.  Cushing's syndrome.  Poisons.  Tetracycline in high dosages.  Pregnancy.  Diabetes.  Hyperlipidemia.  Rapid weight loss. Some people develop fatty liver even having none of these conditions. SYMPTOMS  Fatty liver most often causes no problems. This is called asymptomatic.  It can be diagnosed with blood tests and also by a liver biopsy.  It is one of the  most common causes of minor elevations of liver enzymes on routine blood tests.  Specialized Imaging of the liver using ultrasound, CT (computed tomography) scan, or MRI (magnetic resonance imaging) can suggest a fatty liver but a biopsy is needed to confirm it.  A biopsy involves taking a small sample of liver tissue. This is done by using a needle. It is then looked at under a microscope by a specialist. TREATMENT  It is important to treat the cause. Simple fatty liver without a medical reason may not need treatment.  Weight loss, fat restriction, and exercise in overweight patients produces inconsistent results but is worth trying.  Fatty liver due to alcohol toxicity may not improve even with stopping drinking.  Good control of diabetes may reduce fatty liver.  Lower your triglycerides through diet, medication or both.  Eat a balanced, healthy diet.  Increase your physical activity.  Get regular checkups from a liver specialist.  There are no medical or surgical treatments for a fatty liver or NASH, but improving your diet and increasing your exercise may help prevent or reverse some of the damage. PROGNOSIS  Fatty liver may cause no damage or it can lead to an inflammation of the liver. This is, called steatohepatitis. When it is linked to alcohol abuse, it is called alcoholic steatohepatitis. It often is not linked to alcohol. It is then called nonalcoholic steatohepatitis, or NASH. Over time the liver may become scarred and hardened. This condition is called cirrhosis. Cirrhosis is serious and may lead to liver failure or cancer. NASH is one of the   leading causes of cirrhosis. About 10-20% of Americans have fatty liver and a smaller 2-5% has NASH. Document Released: 07/16/2005 Document Revised: 08/23/2011 Document Reviewed: 09/08/2005 [employer] Patient Information 2013 [employer]  

## 2012-04-18 NOTE — Assessment & Plan Note (Signed)
Confirmed by ultrasound in 2006 and only mild elevation of lft's will check an acute hep panel at next blood draw

## 2012-04-18 NOTE — Assessment & Plan Note (Signed)
Sugars improved. Did have one low number at 4 for no obvious reason so will not add any further meds at this time, has had a very good response to Victoza continue the same meds for now, continue to improve diet and avoid simple carbs and recheck labs in 3 months

## 2012-04-18 NOTE — Assessment & Plan Note (Signed)
Well controlled on current meds no changes 

## 2012-04-18 NOTE — Assessment & Plan Note (Signed)
Doing better, without c/o today

## 2012-04-18 NOTE — Progress Notes (Signed)
Patient ID: [name], female   DOB: 28-Sep-1950, 61 y.o.   MRN: [id number] [name] [id number] 1951-05-07 04/18/2012      Progress Note-Follow Up  Subjective  Chief Complaint  Chief Complaint  Patient presents with  . Follow-up    2 month    HPI  Patient is a 61 year old AA female in today for follow up on multiple medical problems including diabetes. She's had a good response to the toes and her blood sugars are running much better. This morning her blood sugar was 136. The high she seen in the last 2 weeks as 188 and the lowest is 46. She is not having frequent numbers under 80. Feels better, fatigue is improved. No recent illness, fevers, cp, palp, sob, gi or gu c/o.  Past Medical History  Diagnosis Date  . Diaphragmatic hernia without mention of obstruction or gangrene   . Stricture and stenosis of esophagus   . Nonspecific abnormal results of liver function study   . Plantar fascial fibromatosis   . Esophageal reflux   . Unspecified sleep apnea   . Hyperlipidemia   . Diabetes mellitus 61    type 2  . Hypertension 61  . Allergy     seasonal  . Endometriosis   . Arthritis     knee-left  . Depression     job/stress related  . Pharyngitis 02/18/2012  . Nonalcoholic fatty liver disease 04/28/2010    Qualifier: Diagnosis of  By: Lovell Sheehan MD, Balinda Quails     Past Surgical History  Procedure Date  . Appendectomy   . Benigh cyst in lung 2006    right  . Colonoscopy   . Polypectomy     hyperplastic  . Upper gastrointestinal endoscopy   . Abdominal hysterectomy 1982    partial  . Tubal ligation   . Seba     Family History  Problem Relation Age of Onset  . Breast cancer Mother   . Cancer Mother     breast  . Hypertension Mother   . Diabetes Mother     type 2  . Heart disease Father   . Hyperlipidemia Father   . Hypertension Father   . Heart attack Father     MI at age 32  . Diabetes Brother     type 2  . Heart disease Maternal Grandmother   .  Heart attack Maternal Grandfather   . Hypertension Maternal Grandfather   . Stroke Paternal Grandmother   . Hypertension Sister   . Hypertension Brother     History   Social History  . Marital Status: Married    Spouse Name: Marcial Pacas    Number of Children: 2  . Years of Education: N/A   Occupational History  . Engineer, mining    Social History Main Topics  . Smoking status: Never Smoker   . Smokeless tobacco: Never Used  . Alcohol Use: No  . Drug Use: No  . Sexually Active: Yes   Other Topics Concern  . Not on file   Social History Narrative  . No narrative on file    Current Outpatient Prescriptions on File Prior to Visit  Medication Sig Dispense Refill  . aspirin 81 MG EC tablet Take 1 tablet (81 mg total) by mouth daily. Swallow whole.  30 tablet  12  . atorvastatin (LIPITOR) 10 MG tablet Take 1 tablet (10 mg total) by mouth daily.  30 tablet  6  . bisoprolol-hydrochlorothiazide (ZIAC) 5-6.25 MG  per tablet Take 1 tablet by mouth daily.  30 tablet  6  . esomeprazole (NEXIUM) 40 MG capsule Take 1 capsule (40 mg total) by mouth daily before breakfast.  30 capsule  5  . glucose blood (BAYER CONTOUR TEST) test strip Use as instructed  100 each  12  . Insulin Pen Needle (NOVOTWIST) 32G X 5 MM MISC daily  100 each  2  . Liraglutide (VICTOZA) 18 MG/3ML SOLN Inject 0.1 mLs (0.6 mg total) into the skin daily.  6 mL  4  . metFORMIN (GLUCOPHAGE) 1000 MG tablet Take 1 tablet (1,000 mg total) by mouth 2 (two) times daily with a meal. Takes 2 1/2 tab      . Omega-3 Fatty Acids (FISH OIL PO) Take 1 tablet by mouth daily.      . ranitidine (ZANTAC) 150 MG capsule Take 150 mg by mouth as needed.      . nitroGLYCERIN (NITROSTAT) 0.4 MG SL tablet Place 1 tablet (0.4 mg total) under the tongue every 5 (five) minutes as needed for chest pain. After 3 doses proceed to ER for further evaluation if now resolution  25 tablet  1    Allergies  Allergen Reactions  . Amoxicillin     REACTION:  rash  . Codeine     Review of Systems  Review of Systems  Constitutional: Negative for fever and malaise/fatigue.  HENT: Negative for congestion.   Eyes: Negative for discharge.  Respiratory: Negative for shortness of breath.   Cardiovascular: Negative for chest pain, palpitations and leg swelling.  Gastrointestinal: Negative for nausea, abdominal pain and diarrhea.  Genitourinary: Negative for dysuria.  Musculoskeletal: Negative for falls.  Skin: Negative for rash.  Neurological: Negative for loss of consciousness and headaches.  Endo/Heme/Allergies: Negative for polydipsia.  Psychiatric/Behavioral: Negative for depression and suicidal ideas. The patient is not nervous/anxious and does not have insomnia.     Objective  BP 127/82  Pulse 90  Temp 97.4 F (36.3 C) (Temporal)  Ht 5' 2.5" (1.588 m)  Wt 207 lb 6.4 oz (94.076 kg)  BMI 37.33 kg/m2  SpO2 98%  Physical Exam  Physical Exam  Constitutional: She is oriented to person, place, and time and well-developed, well-nourished, and in no distress. No distress.  HENT:  Head: Normocephalic and atraumatic.  Eyes: Conjunctivae normal are normal.  Neck: Neck supple. No thyromegaly present.  Cardiovascular: Normal rate, regular rhythm and normal heart sounds.   No murmur heard. Pulmonary/Chest: Effort normal and breath sounds normal. She has no wheezes.  Abdominal: She exhibits no distension and no mass.  Musculoskeletal: She exhibits no edema.  Lymphadenopathy:    She has no cervical adenopathy.  Neurological: She is alert and oriented to person, place, and time.  Skin: Skin is warm and dry. No rash noted. She is not diaphoretic.  Psychiatric: Memory, affect and judgment normal.    Lab Results  Component Value Date   TSH 1.26 04/11/2012   Lab Results  Component Value Date   WBC 6.7 04/11/2012   HGB 12.9 04/11/2012   HCT 40.0 04/11/2012   MCV 81.4 04/11/2012   PLT 254.0 04/11/2012   Lab Results  Component Value  Date   CREATININE 0.9 04/11/2012   BUN 14 04/11/2012   NA 140 04/11/2012   K 4.3 04/11/2012   CL 103 04/11/2012   CO2 27 04/11/2012   Lab Results  Component Value Date   ALT 57* 04/11/2012   AST 47* 04/11/2012   ALKPHOS 76  04/11/2012   BILITOT 0.5 04/11/2012   Lab Results  Component Value Date   CHOL 135 04/11/2012   Lab Results  Component Value Date   HDL 37.10* 04/11/2012   Lab Results  Component Value Date   LDLCALC 73 04/11/2012   Lab Results  Component Value Date   TRIG 125.0 04/11/2012   Lab Results  Component Value Date   CHOLHDL 4 04/11/2012     Assessment & Plan  Chronic cough Doing better, without c/o today  DIABETES MELLITUS, TYPE II Sugars improved. Did have one low number at 48 for no obvious reason so will not add any further meds at this time, has had a very good response to Victoza continue the same meds for now, continue to improve diet and avoid simple carbs and recheck labs in 3 months  GERD No c/o well controlled  HYPERTENSION Well controlled on current meds no changes  Nonalcoholic fatty liver disease Confirmed by ultrasound in 2006 and only mild elevation of lft's will check an acute hep panel at next blood draw  HYPERLIPIDEMIA Good response to atorvastatin, continue the same

## 2012-04-18 NOTE — Assessment & Plan Note (Signed)
No c/o well controlled

## 2012-05-15 ENCOUNTER — Other Ambulatory Visit: Payer: Self-pay | Admitting: Internal Medicine

## 2012-07-02 ENCOUNTER — Other Ambulatory Visit: Payer: Self-pay | Admitting: Family Medicine

## 2012-08-14 ENCOUNTER — Ambulatory Visit: Payer: BC Managed Care – PPO | Admitting: Family Medicine

## 2012-08-24 ENCOUNTER — Other Ambulatory Visit: Payer: Self-pay

## 2012-08-24 DIAGNOSIS — I10 Essential (primary) hypertension: Secondary | ICD-10-CM

## 2012-08-24 DIAGNOSIS — E785 Hyperlipidemia, unspecified: Secondary | ICD-10-CM

## 2012-08-24 DIAGNOSIS — E119 Type 2 diabetes mellitus without complications: Secondary | ICD-10-CM

## 2012-08-24 DIAGNOSIS — K760 Fatty (change of) liver, not elsewhere classified: Secondary | ICD-10-CM

## 2012-08-24 NOTE — Addendum Note (Signed)
Addended by: Regis Bill on: 08/24/2012 10:15 AM   Modules accepted: Orders

## 2012-08-25 LAB — HEPATITIS PANEL, ACUTE
HCV Ab: NEGATIVE
Hepatitis B Surface Ag: NEGATIVE

## 2012-08-25 LAB — HEPATIC FUNCTION PANEL
AST: 48 U/L — ABNORMAL HIGH (ref 0–37)
Alkaline Phosphatase: 89 U/L (ref 39–117)
Bilirubin, Direct: 0.1 mg/dL (ref 0.0–0.3)
Total Bilirubin: 0.4 mg/dL (ref 0.3–1.2)

## 2012-08-25 LAB — RENAL FUNCTION PANEL
BUN: 13 mg/dL (ref 6–23)
CO2: 26 mEq/L (ref 19–32)
Chloride: 103 mEq/L (ref 96–112)
Creat: 0.84 mg/dL (ref 0.50–1.10)

## 2012-08-25 LAB — CBC WITH DIFFERENTIAL/PLATELET
Basophils Absolute: 0 10*3/uL (ref 0.0–0.1)
Eosinophils Relative: 1 % (ref 0–5)
HCT: 38.9 % (ref 36.0–46.0)
Lymphocytes Relative: 36 % (ref 12–46)
Lymphs Abs: 2.4 10*3/uL (ref 0.7–4.0)
MCV: 77.3 fL — ABNORMAL LOW (ref 78.0–100.0)
Monocytes Absolute: 0.4 10*3/uL (ref 0.1–1.0)
RDW: 14.9 % (ref 11.5–15.5)
WBC: 6.6 10*3/uL (ref 4.0–10.5)

## 2012-08-25 LAB — LIPID PANEL: Total CHOL/HDL Ratio: 3.1 Ratio

## 2012-08-25 LAB — TSH: TSH: 1.189 u[IU]/mL (ref 0.350–4.500)

## 2012-08-26 LAB — HEMOGLOBIN A1C
Hgb A1c MFr Bld: 8.4 % — ABNORMAL HIGH (ref <5.7)
Mean Plasma Glucose: 194 mg/dL — ABNORMAL HIGH (ref <117)

## 2012-08-28 ENCOUNTER — Other Ambulatory Visit: Payer: Self-pay | Admitting: Family Medicine

## 2012-08-28 NOTE — Progress Notes (Signed)
Quick Note:  Patient Informed and voiced understanding ______ 

## 2012-08-29 ENCOUNTER — Telehealth: Payer: Self-pay | Admitting: Family Medicine

## 2012-08-29 ENCOUNTER — Ambulatory Visit (INDEPENDENT_AMBULATORY_CARE_PROVIDER_SITE_OTHER): Payer: BC Managed Care – PPO | Admitting: Family Medicine

## 2012-08-29 ENCOUNTER — Encounter: Payer: Self-pay | Admitting: Family Medicine

## 2012-08-29 VITALS — BP 118/80 | HR 92 | Temp 98.0°F | Ht 62.5 in | Wt 206.0 lb

## 2012-08-29 DIAGNOSIS — K219 Gastro-esophageal reflux disease without esophagitis: Secondary | ICD-10-CM

## 2012-08-29 DIAGNOSIS — E785 Hyperlipidemia, unspecified: Secondary | ICD-10-CM

## 2012-08-29 DIAGNOSIS — K7689 Other specified diseases of liver: Secondary | ICD-10-CM

## 2012-08-29 DIAGNOSIS — E119 Type 2 diabetes mellitus without complications: Secondary | ICD-10-CM

## 2012-08-29 DIAGNOSIS — I10 Essential (primary) hypertension: Secondary | ICD-10-CM

## 2012-08-29 DIAGNOSIS — T7840XD Allergy, unspecified, subsequent encounter: Secondary | ICD-10-CM

## 2012-08-29 DIAGNOSIS — R05 Cough: Secondary | ICD-10-CM

## 2012-08-29 DIAGNOSIS — R053 Chronic cough: Secondary | ICD-10-CM

## 2012-08-29 DIAGNOSIS — Z5189 Encounter for other specified aftercare: Secondary | ICD-10-CM

## 2012-08-29 DIAGNOSIS — R059 Cough, unspecified: Secondary | ICD-10-CM

## 2012-08-29 DIAGNOSIS — K760 Fatty (change of) liver, not elsewhere classified: Secondary | ICD-10-CM

## 2012-08-29 MED ORDER — LIRAGLUTIDE 18 MG/3ML ~~LOC~~ SOLN: 1.2000 mg | Freq: Every day | SUBCUTANEOUS | Status: DC

## 2012-08-29 MED ORDER — FLUTICASONE PROPIONATE 50 MCG/ACT NA SUSP: 2.0000 | Freq: Every day | NASAL | Status: DC

## 2012-08-29 MED ORDER — BISOPROLOL-HYDROCHLOROTHIAZIDE 5-6.25 MG PO TABS: 1.0000 | ORAL_TABLET | Freq: Every day | ORAL | Status: DC

## 2012-08-29 MED ORDER — RANITIDINE HCL 150 MG PO TABS
150.0000 mg | ORAL_TABLET | Freq: Every day | ORAL | Status: DC
Start: 2012-08-29 — End: 2012-08-29

## 2012-08-29 MED ORDER — RANITIDINE HCL 150 MG PO TABS: 150.0000 mg | ORAL_TABLET | Freq: Every day | ORAL | Status: DC

## 2012-08-29 NOTE — Patient Instructions (Addendum)
Next visit gyn Labs prior, hgba1c, tsh, renal, hepatic, cbc Diabetes and Exercise Regular exercise is important and can help:   Control blood glucose (sugar).  Decrease blood pressure.    Control blood lipids (cholesterol, triglycerides).  Improve overall health. BENEFITS FROM EXERCISE  Improved fitness.  Improved flexibility.  Improved endurance.  Increased bone density.  Weight control.  Increased muscle strength.  Decreased body fat.  Improvement of the body's use of insulin, a hormone.  Increased insulin sensitivity.  Reduction of insulin needs.  Reduced stress and tension.  Helps you feel better. People with diabetes who add exercise to their lifestyle gain additional benefits, including:  Weight loss.  Reduced appetite.  Improvement of the body's use of blood glucose.  Decreased risk factors for heart disease:  Lowering of cholesterol and triglycerides.  Raising the level of good cholesterol (high-density lipoproteins, HDL).  Lowering blood sugar.  Decreased blood pressure. TYPE 1 DIABETES AND EXERCISE  Exercise will usually lower your blood glucose.  If blood glucose is greater than 240 mg/dl, check urine ketones. If ketones are present, do not exercise.  Location of the insulin injection sites may need to be adjusted with exercise. Avoid injecting insulin into areas of the body that will be exercised. For example, avoid injecting insulin into:  The arms when playing tennis.  The legs when jogging. For more information, discuss this with your caregiver.  Keep a record of:  Food intake.  Type and amount of exercise.  Expected peak times of insulin action.  Blood glucose levels. Do this before, during, and after exercise. Review your records with your caregiver. This will help you to develop guidelines for adjusting food intake and insulin amounts.  TYPE 2 DIABETES AND EXERCISE  Regular physical activity can help control blood  glucose.  Exercise is important because it may:  Increase the body's sensitivity to insulin.  Improve blood glucose control.  Exercise reduces the risk of heart disease. It decreases serum cholesterol and triglycerides. It also lowers blood pressure.  Those who take insulin or oral hypoglycemic agents should watch for signs of hypoglycemia. These signs include dizziness, shaking, sweating, chills, and confusion.  Body water is lost during exercise. It must be replaced. This will help to avoid loss of body fluids (dehydration) or heat stroke. Be sure to talk to your caregiver before starting an exercise program to make sure it is safe for you. Remember, any activity is better than none.  Document Released: 08/21/2003 Document Revised: 08/23/2011 Document Reviewed: 12/05/2008 [facility] Patient Information 2013 [place], [place].

## 2012-08-29 NOTE — Assessment & Plan Note (Signed)
Resolved, no concerns on exam today

## 2012-08-29 NOTE — Assessment & Plan Note (Signed)
Tolerating Atorvastatin and krill oil caps daily, avoid trans fats

## 2012-08-29 NOTE — Progress Notes (Signed)
Patient ID: [name], female   DOB: January 22, 1951, 62 y.o.   MRN: [id number] [name] [id number] 1951/03/11 08/29/2012      Progress Note-Follow Up  Subjective  Chief Complaint  Chief Complaint  Patient presents with  . Follow-up    3 month    HPI  Patient is a 5 African American female in today for followup. She's doing well. No recent illness. No headache, fevers. No chest pain, palpitations, shortness of breath, GI or GU concerns. She is with her health and is tolerating that well. She's not in any sugars below 100 and her numbers generally are stable 200 now. Most of her numbers run between 1:30 and 170. No polyuria or polydipsia.  Past Medical History  Diagnosis Date  . Diaphragmatic hernia without mention of obstruction or gangrene   . Stricture and stenosis of esophagus   . Nonspecific abnormal results of liver function study   . Plantar fascial fibromatosis   . Esophageal reflux   . Unspecified sleep apnea   . Hyperlipidemia   . Diabetes mellitus 52    type 2  . Hypertension 52  . Allergy     seasonal  . Endometriosis   . Arthritis     knee-left  . Depression     job/stress related  . Pharyngitis 02/18/2012  . Nonalcoholic fatty liver disease 04/28/2010    Qualifier: Diagnosis of  By: Lovell Sheehan MD, Balinda Quails     Past Surgical History  Procedure Laterality Date  . Appendectomy    . Benigh cyst in lung  2006    right  . Colonoscopy    . Polypectomy      hyperplastic  . Upper gastrointestinal endoscopy    . Abdominal hysterectomy  1982    partial  . Tubal ligation    . Seba      Family History  Problem Relation Age of Onset  . Breast cancer Mother   . Cancer Mother     breast  . Hypertension Mother   . Diabetes Mother     type 2  . Heart disease Father   . Hyperlipidemia Father   . Hypertension Father   . Heart attack Father     MI at age 61  . Diabetes Brother     type 2  . Heart disease Maternal Grandmother   . Heart attack  Maternal Grandfather   . Hypertension Maternal Grandfather   . Stroke Paternal Grandmother   . Hypertension Sister   . Hypertension Brother     History   Social History  . Marital Status: Married    Spouse Name: Marcial Pacas    Number of Children: 2  . Years of Education: N/A   Occupational History  . Engineer, mining    Social History Main Topics  . Smoking status: Never Smoker   . Smokeless tobacco: Never Used  . Alcohol Use: No  . Drug Use: No  . Sexually Active: Yes   Other Topics Concern  . Not on file   Social History Narrative  . No narrative on file    Current Outpatient Prescriptions on File Prior to Visit  Medication Sig Dispense Refill  . aspirin 81 MG EC tablet Take 1 tablet (81 mg total) by mouth daily. Swallow whole.  30 tablet  12  . atorvastatin (LIPITOR) 10 MG tablet Take 1 tablet (10 mg total) by mouth daily.  30 tablet  6  . esomeprazole (NEXIUM) 40 MG capsule Take 1 capsule (  40 mg total) by mouth daily before breakfast.  30 capsule  5  . glucose blood (BAYER CONTOUR TEST) test strip Use as instructed  100 each  12  . Insulin Pen Needle (NOVOTWIST) 32G X 5 MM MISC daily  100 each  2  . metFORMIN (GLUCOPHAGE) 1000 MG tablet Take 1 tablet (1,000 mg total) by mouth 2 (two) times daily with a meal. Takes 2 1/2 tab      . metFORMIN (GLUCOPHAGE) 1000 MG tablet TAKE 1 TABLET BY MOUTH TWICE A DAY  60 tablet  11  . nitroGLYCERIN (NITROSTAT) 0.4 MG SL tablet Place 1 tablet (0.4 mg total) under the tongue every 5 (five) minutes as needed for chest pain. After 3 doses proceed to ER for further evaluation if now resolution  25 tablet  1  . Omega-3 Fatty Acids (FISH OIL PO) Take 1 tablet by mouth daily.       No current facility-administered medications on file prior to visit.    Allergies  Allergen Reactions  . Amoxicillin     REACTION: rash  . Codeine     Review of Systems  Review of Systems  Constitutional: Negative for fever and malaise/fatigue.  HENT:  Negative for congestion.   Eyes: Negative for discharge.  Respiratory: Negative for shortness of breath.   Cardiovascular: Negative for chest pain, palpitations and leg swelling.  Gastrointestinal: Negative for nausea, abdominal pain and diarrhea.  Genitourinary: Negative for dysuria.  Musculoskeletal: Negative for falls.  Skin: Negative for rash.  Neurological: Negative for loss of consciousness and headaches.  Endo/Heme/Allergies: Negative for polydipsia.  Psychiatric/Behavioral: Negative for depression and suicidal ideas. The patient is not nervous/anxious and does not have insomnia.     Objective  BP 118/80  Pulse 92  Temp(Src) 98 F (36.7 C) (Oral)  Ht 5' 2.5" (1.588 m)  Wt 206 lb (93.441 kg)  BMI 37.05 kg/m2  SpO2 98%  Physical Exam  Physical Exam  Constitutional: She is oriented to person, place, and time and well-developed, well-nourished, and in no distress. No distress.  HENT:  Head: Normocephalic and atraumatic.  Eyes: Conjunctivae are normal.  Neck: Neck supple. No thyromegaly present.  Cardiovascular: Normal rate, regular rhythm and normal heart sounds.   No murmur heard. Pulmonary/Chest: Effort normal and breath sounds normal. She has no wheezes.  Abdominal: She exhibits no distension and no mass.  Musculoskeletal: She exhibits no edema.  Lymphadenopathy:    She has no cervical adenopathy.  Neurological: She is alert and oriented to person, place, and time.  Skin: Skin is warm and dry. No rash noted. She is not diaphoretic.  Psychiatric: Memory, affect and judgment normal.    Lab Results  Component Value Date   TSH 1.189 08/24/2012   Lab Results  Component Value Date   WBC 6.6 08/24/2012   HGB 13.1 08/24/2012   HCT 38.9 08/24/2012   MCV 77.3* 08/24/2012   PLT 272 08/24/2012   Lab Results  Component Value Date   CREATININE 0.84 08/24/2012   BUN 13 08/24/2012   NA 139 08/24/2012   K 4.3 08/24/2012   CL 103 08/24/2012   CO2 26 08/24/2012   Lab Results   Component Value Date   ALT 64* 08/24/2012   AST 48* 08/24/2012   ALKPHOS 89 08/24/2012   BILITOT 0.4 08/24/2012   Lab Results  Component Value Date   CHOL 129 08/24/2012   Lab Results  Component Value Date   HDL 41 08/24/2012   Lab  Results  Component Value Date   LDLCALC 68 08/24/2012   Lab Results  Component Value Date   TRIG 102 08/24/2012   Lab Results  Component Value Date   CHOLHDL 3.1 08/24/2012     Assessment & Plan  HYPERTENSION Doing well, no changes today  Nonalcoholic fatty liver disease LFTs stable, minimize simple carbs and saturated fats  Chronic cough Resolved, no concerns on exam today  DIABETES MELLITUS, TYPE II hgba1c 8.4 today but just increased Victoza to 1.2mg  a week ago. Continue to minimize simple carbs and no other changes to meds.  HYPERLIPIDEMIA Tolerating Atorvastatin and krill oil caps daily, avoid trans fats

## 2012-08-29 NOTE — Assessment & Plan Note (Signed)
LFTs stable, minimize simple carbs and saturated fats

## 2012-08-29 NOTE — Telephone Encounter (Signed)
Lab order week of 11-20-2012 hbga1c tsh renal hepatic cbc

## 2012-08-29 NOTE — Assessment & Plan Note (Signed)
Doing well, no changes today 

## 2012-08-29 NOTE — Assessment & Plan Note (Signed)
hgba1c 8.4 today but just increased Victoza to 1.2mg  a week ago. Continue to minimize simple carbs and no other changes to meds.

## 2012-09-22 ENCOUNTER — Other Ambulatory Visit: Payer: Self-pay | Admitting: *Deleted

## 2012-09-22 MED ORDER — ATORVASTATIN CALCIUM 10 MG PO TABS: 10.0000 mg | ORAL_TABLET | Freq: Every day | ORAL | Status: DC

## 2012-09-22 NOTE — Telephone Encounter (Signed)
Rx request to pharmacy/[facility]  

## 2012-10-12 ENCOUNTER — Other Ambulatory Visit: Payer: Self-pay | Admitting: Internal Medicine

## 2012-11-27 ENCOUNTER — Ambulatory Visit: Payer: BC Managed Care – PPO | Admitting: Family Medicine

## 2013-01-07 ENCOUNTER — Other Ambulatory Visit: Payer: Self-pay | Admitting: Family Medicine

## 2013-01-22 ENCOUNTER — Ambulatory Visit (INDEPENDENT_AMBULATORY_CARE_PROVIDER_SITE_OTHER): Payer: BC Managed Care – PPO | Admitting: Family Medicine

## 2013-01-22 ENCOUNTER — Encounter: Payer: Self-pay | Admitting: Family Medicine

## 2013-01-22 VITALS — BP 100/70 | HR 72 | Temp 98.7°F | Ht 62.5 in | Wt 204.1 lb

## 2013-01-22 DIAGNOSIS — R109 Unspecified abdominal pain: Secondary | ICD-10-CM

## 2013-01-22 DIAGNOSIS — I10 Essential (primary) hypertension: Secondary | ICD-10-CM

## 2013-01-22 DIAGNOSIS — G4710 Hypersomnia, unspecified: Secondary | ICD-10-CM

## 2013-01-22 DIAGNOSIS — Z124 Encounter for screening for malignant neoplasm of cervix: Secondary | ICD-10-CM

## 2013-01-22 DIAGNOSIS — G4730 Sleep apnea, unspecified: Secondary | ICD-10-CM

## 2013-01-22 DIAGNOSIS — E119 Type 2 diabetes mellitus without complications: Secondary | ICD-10-CM

## 2013-01-22 DIAGNOSIS — E785 Hyperlipidemia, unspecified: Secondary | ICD-10-CM

## 2013-01-22 DIAGNOSIS — R1030 Lower abdominal pain, unspecified: Secondary | ICD-10-CM

## 2013-01-22 DIAGNOSIS — Z0000 Encounter for general adult medical examination without abnormal findings: Secondary | ICD-10-CM

## 2013-01-22 HISTORY — DX: Encounter for general adult medical examination without abnormal findings: Z00.00

## 2013-01-22 LAB — RENAL FUNCTION PANEL
BUN: 11 mg/dL (ref 6–23)
CO2: 26 mEq/L (ref 19–32)
Chloride: 100 mEq/L (ref 96–112)
Creat: 0.88 mg/dL (ref 0.50–1.10)
Glucose, Bld: 226 mg/dL — ABNORMAL HIGH (ref 70–99)
Phosphorus: 3.4 mg/dL (ref 2.3–4.6)
Potassium: 4.3 mEq/L (ref 3.5–5.3)

## 2013-01-22 LAB — CBC
MCH: 25.2 pg — ABNORMAL LOW (ref 26.0–34.0)
MCHC: 32.7 g/dL (ref 30.0–36.0)
Platelets: 290 10*3/uL (ref 150–400)
RDW: 14.2 % (ref 11.5–15.5)

## 2013-01-22 LAB — LIPID PANEL
LDL Cholesterol: 69 mg/dL (ref 0–99)
Triglycerides: 176 mg/dL — ABNORMAL HIGH (ref <150)
VLDL: 35 mg/dL (ref 0–40)

## 2013-01-22 LAB — HEPATIC FUNCTION PANEL
Alkaline Phosphatase: 86 U/L (ref 39–117)
Bilirubin, Direct: 0.1 mg/dL (ref 0.0–0.3)
Indirect Bilirubin: 0.3 mg/dL (ref 0.0–0.9)
Total Bilirubin: 0.4 mg/dL (ref 0.3–1.2)

## 2013-01-22 MED ORDER — CANAGLIFLOZIN 300 MG PO TABS: 300.0000 mg | ORAL_TABLET | Freq: Every day | ORAL | Status: DC

## 2013-01-22 MED ORDER — CANAGLIFLOZIN 100 MG PO TABS: 100.0000 mg | ORAL_TABLET | Freq: Every day | ORAL | Status: DC

## 2013-01-22 NOTE — Assessment & Plan Note (Signed)
Has not used chosen for the last 3 months due to cost concerns. Continue metformin thousand milligrams twice a day and add Invokana. 100 mg daily and then increase to 300 mg daily as tolerated. Check a hemoglobin A1c. Minimize simple carbs per

## 2013-01-22 NOTE — Patient Instructions (Addendum)

## 2013-01-22 NOTE — Assessment & Plan Note (Signed)
Check alipid panel and adjust meds as needed.

## 2013-01-22 NOTE — Assessment & Plan Note (Addendum)
Well controlled, no changes 

## 2013-01-22 NOTE — Assessment & Plan Note (Addendum)
Pap today, no concerns on exam.  

## 2013-01-22 NOTE — Assessment & Plan Note (Signed)
Encouraged heart healthy diet, regular exercise, quality sleep, fasting labs ordered today.

## 2013-01-22 NOTE — Progress Notes (Signed)
Patient ID: [name], female   DOB: [birth year], 62 y.o.   MRN: [id number] [name] [id number] 09/13/1950 01/22/2013      Progress Note-Follow Up  Subjective  Chief Complaint  Chief Complaint  Patient presents with  . Annual Exam    physical  . Gynecologic Exam    pap and breast exam    HPI  Patient is a 62 year old American female who is in today for annual exam. Feeling well. Struggles with persistent low back pain and arthritic stiffness and pain but nothing new. Has had some recent right knee pain and some mild swelling at the back but none today. No injury warmth or redness. Has been diagnosed with apnea but is not treating do to her inability to afford the machine. Has not been using Pitocin since April due to cost either. Denies polyuria or polydipsia. Is taking her metformin twice a day. No chest pain, palpitations, shortness of breath, GI or GU concerns at this time.  Past Medical History  Diagnosis Date  . Diaphragmatic hernia without mention of obstruction or gangrene   . Stricture and stenosis of esophagus   . Nonspecific abnormal results of liver function study   . Plantar fascial fibromatosis   . Esophageal reflux   . Unspecified sleep apnea   . Hyperlipidemia   . Diabetes mellitus 52    type 2  . Hypertension 52  . Allergy     seasonal  . Endometriosis   . Arthritis     knee-left  . Depression     job/stress related  . Pharyngitis 02/18/2012  . Nonalcoholic fatty liver disease 04/28/2010    Qualifier: Diagnosis of  By: Lovell Sheehan MD, Balinda Quails [facility] 01/22/2013    Past Surgical History  Procedure Laterality Date  . Appendectomy    . Benigh cyst in lung  2006    right  . Colonoscopy    . Polypectomy      hyperplastic  . Upper gastrointestinal endoscopy    . Abdominal hysterectomy  1982    partial  . Tubal ligation    . Seba      Family History  Problem Relation Age of Onset  . Breast cancer Mother   . Cancer  Mother     breast  . Hypertension Mother   . Diabetes Mother     type 2  . Heart disease Father   . Hyperlipidemia Father   . Hypertension Father   . Heart attack Father     MI at age 98  . Diabetes Brother     type 2  . Heart disease Maternal Grandmother   . Heart attack Maternal Grandfather   . Hypertension Maternal Grandfather   . Stroke Paternal Grandmother   . Hypertension Sister   . Hypertension Brother     History   Social History  . Marital Status: Married    Spouse Name: Marcial Pacas    Number of Children: 2  . Years of Education: N/A   Occupational History  . Engineer, mining    Social History Main Topics  . Smoking status: Never Smoker   . Smokeless tobacco: Never Used  . Alcohol Use: No  . Drug Use: No  . Sexually Active: Yes   Other Topics Concern  . Not on file   Social History Narrative  . No narrative on file    Current Outpatient Prescriptions on File Prior to Visit  Medication Sig Dispense Refill  .  aspirin EC 81 MG tablet TAKE 1 TABLET (81 MG TOTAL) BY MOUTH DAILY. SWALLOW WHOLE.  30 tablet  11  . atorvastatin (LIPITOR) 10 MG tablet Take 1 tablet (10 mg total) by mouth daily.  30 tablet  6  . bisoprolol-hydrochlorothiazide (ZIAC) 5-6.25 MG per tablet Take 1 tablet by mouth daily.  30 tablet  6  . esomeprazole (NEXIUM) 40 MG capsule Take 1 capsule (40 mg total) by mouth daily before breakfast.  30 capsule  5  . fluticasone (FLONASE) 50 MCG/ACT nasal spray Place 2 sprays into the nose daily.  16 g  6  . metFORMIN (GLUCOPHAGE) 1000 MG tablet TAKE 1 TABLET BY MOUTH TWICE A DAY  60 tablet  11  . Omega-3 Fatty Acids (FISH OIL PO) Take 1 tablet by mouth daily.      . ranitidine (ZANTAC) 150 MG tablet Take 1 tablet (150 mg total) by mouth at bedtime.  30 tablet  5  . nitroGLYCERIN (NITROSTAT) 0.4 MG SL tablet Place 1 tablet (0.4 mg total) under the tongue every 5 (five) minutes as needed for chest pain. After 3 doses proceed to ER for further evaluation if  now resolution  25 tablet  1   No current facility-administered medications on file prior to visit.    Allergies  Allergen Reactions  . Amoxicillin     REACTION: rash  . Codeine     Review of Systems  Review of Systems  Constitutional: Positive for malaise/fatigue. Negative for fever.  HENT: Negative for congestion.   Eyes: Negative for pain and discharge.  Respiratory: Negative for shortness of breath.   Cardiovascular: Negative for chest pain, palpitations and leg swelling.  Gastrointestinal: Negative for nausea, abdominal pain and diarrhea.  Genitourinary: Negative for dysuria.  Musculoskeletal: Positive for back pain. Negative for falls.  Skin: Negative for rash.  Neurological: Negative for loss of consciousness and headaches.  Endo/Heme/Allergies: Negative for polydipsia.  Psychiatric/Behavioral: Negative for depression and suicidal ideas. The patient is not nervous/anxious and does not have insomnia.     Objective  BP 100/70  Pulse 72  Temp(Src) 98.7 F (37.1 C) (Oral)  Ht 5' 2.5" (1.588 m)  Wt 204 lb 1.3 oz (92.57 kg)  BMI 36.71 kg/m2  SpO2 72%  Physical Exam  Physical Exam  Constitutional: She is oriented to person, place, and time and well-developed, well-nourished, and in no distress. No distress.  HENT:  Head: Normocephalic and atraumatic.  Eyes: Conjunctivae are normal.  Neck: Neck supple. No thyromegaly present.  Cardiovascular: Normal rate and regular rhythm.  Exam reveals no gallop.   No murmur heard. Pulmonary/Chest: Effort normal and breath sounds normal. She has no wheezes.  Abdominal: She exhibits no distension and no mass.  Genitourinary: Vagina normal, right adnexa normal and left adnexa normal. No vaginal discharge found.  No breast lesions, discharge or skin changes.  Musculoskeletal: She exhibits no edema.  Lymphadenopathy:    She has no cervical adenopathy.  Neurological: She is alert and oriented to person, place, and time.  Skin:  Skin is warm and dry. No rash noted. She is not diaphoretic.  Psychiatric: Memory, affect and judgment normal.    Lab Results  Component Value Date   TSH 1.189 08/24/2012   Lab Results  Component Value Date   WBC 6.6 08/24/2012   HGB 13.1 08/24/2012   HCT 38.9 08/24/2012   MCV 77.3* 08/24/2012   PLT 272 08/24/2012   Lab Results  Component Value Date   CREATININE 0.84  08/24/2012   BUN 13 08/24/2012   NA 139 08/24/2012   K 4.3 08/24/2012   CL 103 08/24/2012   CO2 26 08/24/2012   Lab Results  Component Value Date   ALT 64* 08/24/2012   AST 48* 08/24/2012   ALKPHOS 89 08/24/2012   BILITOT 0.4 08/24/2012   Lab Results  Component Value Date   CHOL 129 08/24/2012   Lab Results  Component Value Date   HDL 41 08/24/2012   Lab Results  Component Value Date   LDLCALC 68 08/24/2012   Lab Results  Component Value Date   TRIG 102 08/24/2012   Lab Results  Component Value Date   CHOLHDL 3.1 08/24/2012     Assessment & Plan  HYPERTENSION Well controlled, no changes  Cervical cancer screening Pap today, no concerns on exam  DIABETES MELLITUS, TYPE II Has not used chosen for the last 3 months due to cost concerns. Continue metformin thousand milligrams twice a day and add Invokana. 100 mg daily and then increase to 300 mg daily as tolerated. Check a hemoglobin A1c. Minimize simple carbs per  HYPERLIPIDEMIA Check alipid panel and adjust meds as needed.  HYPERSOMNIA WITH SLEEP APNEA UNSPECIFIED Not using CPAP due to monetary concerns. Encouraged to consider restarting treatment. Warned that not treating can cause heart, brain and lung complications  Preventative health care Encouraged heart healthy diet, regular exercise, quality sleep, fasting labs ordered today.

## 2013-01-22 NOTE — Assessment & Plan Note (Signed)
Not using CPAP due to monetary concerns. Encouraged to consider restarting treatment. Warned that not treating can cause heart, brain and lung complications

## 2013-01-23 LAB — HEMOGLOBIN A1C
Hgb A1c MFr Bld: 9.2 % — ABNORMAL HIGH (ref <5.7)
Mean Plasma Glucose: 217 mg/dL — ABNORMAL HIGH (ref <117)

## 2013-01-23 LAB — URINALYSIS
Bilirubin Urine: NEGATIVE
Glucose, UA: 250 mg/dL — AB
Hgb urine dipstick: NEGATIVE
Ketones, ur: NEGATIVE mg/dL
Leukocytes, UA: NEGATIVE
Nitrite: NEGATIVE
Protein, ur: NEGATIVE mg/dL
Specific Gravity, Urine: 1.018 (ref 1.005–1.030)
Urobilinogen, UA: 0.2 mg/dL (ref 0.0–1.0)
pH: 5 (ref 5.0–8.0)

## 2013-01-23 LAB — TSH: TSH: 1.115 u[IU]/mL (ref 0.350–4.500)

## 2013-01-23 NOTE — Progress Notes (Signed)
Quick Note:  Patient Informed and voiced understanding ______ 

## 2013-01-26 NOTE — Progress Notes (Signed)
Quick Note:  Patient Informed and voiced understanding ______ 

## 2013-02-02 ENCOUNTER — Other Ambulatory Visit: Payer: Self-pay | Admitting: Family Medicine

## 2013-02-05 ENCOUNTER — Telehealth: Payer: Self-pay | Admitting: Family Medicine

## 2013-02-05 NOTE — Telephone Encounter (Signed)
No if you look at the lab results from the pap on 01-22-13 Dr Abner Greenspan stated:  repeat when we see her back in a couple months or she can come in just for that if she would like.   so 02-26-13 would be fine. I don't see anywhere where it was supposed to be done in the next two weeks.

## 2013-02-05 NOTE — Telephone Encounter (Signed)
Informed patient that she did not need to come back in in a couple of weeks. The note states a couple of months. Patient states that she will keep 02/26/13 appointment.

## 2013-02-05 NOTE — Telephone Encounter (Signed)
Patient was told to schedule an appointment with Dr. Abner Greenspan within the next two weeks for pap. 1st available was 02/26/13. She wants to know if it is okay to wait that long?

## 2013-02-26 ENCOUNTER — Ambulatory Visit (INDEPENDENT_AMBULATORY_CARE_PROVIDER_SITE_OTHER): Payer: BC Managed Care – PPO | Admitting: Family Medicine

## 2013-02-26 ENCOUNTER — Other Ambulatory Visit (HOSPITAL_COMMUNITY)
Admission: RE | Admit: 2013-02-26 | Discharge: 2013-02-26 | Disposition: A | Discharge status: Home / Self Care | Payer: BC Managed Care – PPO | Source: Ambulatory Visit | Attending: Family Medicine | Admitting: Family Medicine

## 2013-02-26 ENCOUNTER — Encounter: Payer: Self-pay | Admitting: Family Medicine

## 2013-02-26 VITALS — BP 132/62 | HR 65 | Temp 97.9°F | Ht 62.5 in | Wt 198.1 lb

## 2013-02-26 DIAGNOSIS — E119 Type 2 diabetes mellitus without complications: Secondary | ICD-10-CM

## 2013-02-26 DIAGNOSIS — Z0000 Encounter for general adult medical examination without abnormal findings: Secondary | ICD-10-CM

## 2013-02-26 DIAGNOSIS — Z124 Encounter for screening for malignant neoplasm of cervix: Secondary | ICD-10-CM

## 2013-02-26 DIAGNOSIS — I10 Essential (primary) hypertension: Secondary | ICD-10-CM

## 2013-02-26 DIAGNOSIS — Z23 Encounter for immunization: Secondary | ICD-10-CM

## 2013-02-26 DIAGNOSIS — Z01419 Encounter for gynecological examination (general) (routine) without abnormal findings: Secondary | ICD-10-CM | POA: Insufficient documentation

## 2013-02-26 NOTE — Patient Instructions (Addendum)
Has next appt already MegaRed krill oil caps daily Probiotic daily such Digestive Advantage   Basic Carbohydrate Counting Basic carbohydrate counting is a way to plan meals. It is done by counting the amount of carbohydrate in foods. Foods that have carbohydrates are starches (grains, beans, starchy vegetables) and sweets. Eating carbohydrates increases blood glucose (sugar) levels. People with diabetes use carbohydrate counting to help keep their blood glucose at a normal level.  COUNTING CARBOHYDRATES IN FOODS The first step in counting carbohydrates is to learn how many carbohydrate servings you should have in every meal. A dietitian can plan this for you. After learning the amount of carbohydrates to include in your meal plan, you can start to choose the carbohydrate-containing foods you want to eat.  There are 2 ways to identify the amount of carbohydrates in the foods you eat.  Read the Nutrition Facts panel on food labels. You need 2 pieces of information from the Nutrition Facts panel to count carbohydrates this way:  Serving size.  Total carbohydrate (in grams). Decide how many servings you will be eating. If it is 1 serving, you will be eating the amount of carbohydrate listed on the panel. If you will be eating 2 servings, you will be eating double the amount of carbohydrate listed on the panel.   Learn serving sizes. A serving size of most carbohydrate-containing foods is about 15 grams (g). Listed below are single serving sizes of common carbohydrate-containing foods:  1 slice bread.   cup unsweetened, dry cereal.   cup hot cereal.   cup rice.   cup mashed potatoes.   cup pasta.  1 cup fresh fruit.   cup canned fruit.  1 cup milk (whole, 2%, or skim).   cup starchy vegetables (peas, corn, or potatoes). Counting carbohydrates this way is similar to looking on the Nutrition Facts panel. Decide how many servings you will eat first. Multiply the number of servings  you eat by 15 g. For example, if you have 2 cups of strawberries, you had 2 servings. That means you had 30 g of carbohydrate (2 servings x 15 g = 30 g). CALCULATING CARBOHYDRATES IN A MEAL Sample dinner  3 oz chicken breast.   cup brown rice.   cup corn.  1 cup fat-free milk.  1 cup strawberries with sugar-free whipped topping. Carbohydrate calculation First, identify the foods that contain carbohydrate:  Rice.  Corn.  Milk.  Strawberries. Calculate the number of servings eaten:  2 servings rice.  1 serving corn.  1 serving milk.  1 serving strawberries. Multiply the number of servings by 15 g:  2 servings rice x 15 g = 30 g.  1 serving corn x 15 g = 15 g.  1 serving milk x 15 g = 15 g.  1 serving strawberries x 15 g = 15 g. Add the amounts to find the total carbohydrates eaten: 30 g + 15 g + 15 g + 15 g = 75 g carbohydrate eaten at dinner. Document Released: 05/31/2005 Document Revised: 08/23/2011 Document Reviewed: 04/16/2011 [facility] Patient Information 2014 [place], [place].

## 2013-02-26 NOTE — Progress Notes (Signed)
Patient ID: [name], female   DOB: [birth year], 62 y.o.   MRN: [id number] [name] [id number] 1950/08/22 02/26/2013      Progress Note-Follow Up  Subjective  Chief Complaint  Chief Complaint  Patient presents with  . Gynecologic Exam    pap    HPI  Patient is a 62 year old female in today for routine medical care. She is in need of a Pap smear. She feels well. No recent illness. No chest pain, palpitations, recent illness, fevers, shortness of breath, GI or GU concerns. Patient notes blood sugars are improving. Last night temperature was 190 after eating. Generally she is in the 120s to 150s in the morning. No polyuria or polydipsia.  Past Medical History  Diagnosis Date  . Diaphragmatic hernia without mention of obstruction or gangrene   . Stricture and stenosis of esophagus   . Nonspecific abnormal results of liver function study   . Plantar fascial fibromatosis   . Esophageal reflux   . Unspecified sleep apnea   . Hyperlipidemia   . Diabetes mellitus 52    type 2  . Hypertension 52  . Allergy     seasonal  . Endometriosis   . Arthritis     knee-left  . Depression     job/stress related  . Pharyngitis 02/18/2012  . Nonalcoholic fatty liver disease 04/28/2010    Qualifier: Diagnosis of  By: Lovell Sheehan MD, Balinda Quails [facility] 01/22/2013    Past Surgical History  Procedure Laterality Date  . Appendectomy    . Benigh cyst in lung  2006    right  . Colonoscopy    . Polypectomy      hyperplastic  . Upper gastrointestinal endoscopy    . Abdominal hysterectomy  1982    partial  . Tubal ligation    . Seba      Family History  Problem Relation Age of Onset  . Breast cancer Mother   . Cancer Mother     breast  . Hypertension Mother   . Diabetes Mother     type 2  . Heart disease Father   . Hyperlipidemia Father   . Hypertension Father   . Heart attack Father     MI at age 70  . Diabetes Brother     type 2  . Heart disease  Maternal Grandmother   . Heart attack Maternal Grandfather   . Hypertension Maternal Grandfather   . Stroke Paternal Grandmother   . Hypertension Sister   . Hypertension Brother     History   Social History  . Marital Status: Married    Spouse Name: Marcial Pacas    Number of Children: 2  . Years of Education: N/A   Occupational History  . Engineer, mining    Social History Main Topics  . Smoking status: Never Smoker   . Smokeless tobacco: Never Used  . Alcohol Use: No  . Drug Use: No  . Sexual Activity: Yes   Other Topics Concern  . Not on file   Social History Narrative  . No narrative on file    Current Outpatient Prescriptions on File Prior to Visit  Medication Sig Dispense Refill  . aspirin EC 81 MG tablet TAKE 1 TABLET (81 MG TOTAL) BY MOUTH DAILY. SWALLOW WHOLE.  30 tablet  11  . atorvastatin (LIPITOR) 10 MG tablet Take 1 tablet (10 mg total) by mouth daily.  30 tablet  6  . bisoprolol-hydrochlorothiazide (ZIAC) 5-6.25 MG  per tablet Take 1 tablet by mouth daily.  30 tablet  6  . bisoprolol-hydrochlorothiazide (ZIAC) 5-6.25 MG per tablet TAKE 1 TABLET BY MOUTH DAILY.  30 tablet  6  . Canagliflozin (INVOKANA) 100 MG TABS Take 1 tablet (100 mg total) by mouth daily.  5 tablet  0  . Canagliflozin (INVOKANA) 300 MG TABS Take 1 tablet (300 mg total) by mouth daily.  30 tablet  2  . esomeprazole (NEXIUM) 40 MG capsule Take 1 capsule (40 mg total) by mouth daily before breakfast.  30 capsule  5  . fluticasone (FLONASE) 50 MCG/ACT nasal spray Place 2 sprays into the nose daily.  16 g  6  . metFORMIN (GLUCOPHAGE) 1000 MG tablet TAKE 1 TABLET BY MOUTH TWICE A DAY  60 tablet  11  . Omega-3 Fatty Acids (FISH OIL PO) Take 1 tablet by mouth daily.      . ranitidine (ZANTAC) 150 MG tablet Take 1 tablet (150 mg total) by mouth at bedtime.  30 tablet  5  . nitroGLYCERIN (NITROSTAT) 0.4 MG SL tablet Place 1 tablet (0.4 mg total) under the tongue every 5 (five) minutes as needed for chest  pain. After 3 doses proceed to ER for further evaluation if now resolution  25 tablet  1   No current facility-administered medications on file prior to visit.    Allergies  Allergen Reactions  . Amoxicillin     REACTION: rash  . Codeine     Review of Systems  Review of Systems  Constitutional: Negative for fever, chills and malaise/fatigue.  HENT: Negative for hearing loss, nosebleeds and congestion.   Eyes: Negative for discharge.  Respiratory: Negative for cough, sputum production, shortness of breath and wheezing.   Cardiovascular: Negative for chest pain, palpitations and leg swelling.  Gastrointestinal: Negative for heartburn, nausea, vomiting, abdominal pain, diarrhea, constipation and blood in stool.  Genitourinary: Negative for dysuria, urgency, frequency and hematuria.  Musculoskeletal: Negative for myalgias, back pain and falls.  Skin: Negative for rash.  Neurological: Negative for dizziness, tremors, sensory change, focal weakness, loss of consciousness, weakness and headaches.  Endo/Heme/Allergies: Negative for polydipsia. Does not bruise/bleed easily.  Psychiatric/Behavioral: Negative for depression and suicidal ideas. The patient is not nervous/anxious and does not have insomnia.     Objective  BP 132/62  Pulse 65  Temp(Src) 97.9 F (36.6 C) (Oral)  Ht 5' 2.5" (1.588 m)  Wt 198 lb 1.9 oz (89.867 kg)  BMI 35.64 kg/m2  SpO2 95%  Physical Exam  Physical Exam  Constitutional: She is oriented to person, place, and time and well-developed, well-nourished, and in no distress. No distress.  HENT:  Head: Normocephalic and atraumatic.  Right Ear: External ear normal.  Left Ear: External ear normal.  Nose: Nose normal.  Mouth/Throat: Oropharynx is clear and moist. No oropharyngeal exudate.  Eyes: Conjunctivae are normal. Pupils are equal, round, and reactive to light. Right eye exhibits no discharge. Left eye exhibits no discharge. No scleral icterus.  Neck:  Normal range of motion. Neck supple. No thyromegaly present.  Cardiovascular: Normal rate, regular rhythm, normal heart sounds and intact distal pulses.   No murmur heard. Pulmonary/Chest: Effort normal and breath sounds normal. No respiratory distress. She has no wheezes. She has no rales.  Abdominal: Soft. Bowel sounds are normal. She exhibits no distension and no mass. There is no tenderness.  Musculoskeletal: Normal range of motion. She exhibits no edema and no tenderness.  Lymphadenopathy:    She has no cervical  adenopathy.  Neurological: She is alert and oriented to person, place, and time. She has normal reflexes. No cranial nerve deficit. Coordination normal.  Skin: Skin is warm and dry. No rash noted. She is not diaphoretic.  Psychiatric: Mood, memory and affect normal.    Lab Results  Component Value Date   TSH 1.115 01/22/2013   Lab Results  Component Value Date   WBC 8.1 01/22/2013   HGB 12.7 01/22/2013   HCT 38.8 01/22/2013   MCV 77.1* 01/22/2013   PLT 290 01/22/2013   Lab Results  Component Value Date   CREATININE 0.88 01/22/2013   BUN 11 01/22/2013   NA 139 01/22/2013   K 4.3 01/22/2013   CL 100 01/22/2013   CO2 26 01/22/2013   Lab Results  Component Value Date   ALT 49* 01/22/2013   AST 34 01/22/2013   ALKPHOS 86 01/22/2013   BILITOT 0.4 01/22/2013   Lab Results  Component Value Date   CHOL 145 01/22/2013   Lab Results  Component Value Date   HDL 41 01/22/2013   Lab Results  Component Value Date   LDLCALC 69 01/22/2013   Lab Results  Component Value Date   TRIG 176* 01/22/2013   Lab Results  Component Value Date   CHOLHDL 3.5 01/22/2013     Assessment & Plan  HYPERTENSION Well controlled no changes  DIABETES MELLITUS, TYPE II hga1c 9.2 discussed carb counting and need to continue metformin. Add Invokana and continue to monitor.   Cervical cancer screening Pap today, no concerns on exam  Preventative health care Given flu shot today, asked to  consider DASH diet. Minimize simplecarbs and increase exercise

## 2013-02-27 LAB — HM DIABETES EYE EXAM

## 2013-02-28 NOTE — Progress Notes (Signed)
Quick Note:  Patient Informed and voiced understanding ______ 

## 2013-03-03 NOTE — Assessment & Plan Note (Signed)
Well controlled no changes 

## 2013-03-03 NOTE — Assessment & Plan Note (Addendum)
hga1c 9.2 discussed carb counting and need to continue metformin. Add Invokana and continue to monitor.

## 2013-03-03 NOTE — Assessment & Plan Note (Signed)
Given flu shot today, asked to consider DASH diet. Minimize simplecarbs and increase exercise

## 2013-03-03 NOTE — Assessment & Plan Note (Signed)
Pap today, no concerns on exam.  

## 2013-03-06 ENCOUNTER — Other Ambulatory Visit: Payer: Self-pay | Admitting: Family Medicine

## 2013-03-09 ENCOUNTER — Other Ambulatory Visit: Payer: Self-pay | Admitting: Family Medicine

## 2013-04-11 ENCOUNTER — Other Ambulatory Visit: Payer: Self-pay | Admitting: Family Medicine

## 2013-04-19 ENCOUNTER — Other Ambulatory Visit: Payer: Self-pay

## 2013-04-29 ENCOUNTER — Other Ambulatory Visit: Payer: Self-pay | Admitting: Family Medicine

## 2013-05-06 ENCOUNTER — Other Ambulatory Visit: Payer: Self-pay | Admitting: Family Medicine

## 2013-05-08 ENCOUNTER — Other Ambulatory Visit: Payer: Self-pay | Admitting: Family Medicine

## 2013-05-08 ENCOUNTER — Other Ambulatory Visit: Payer: Self-pay | Admitting: Internal Medicine

## 2013-05-15 ENCOUNTER — Telehealth: Payer: Self-pay

## 2013-05-15 NOTE — Telephone Encounter (Signed)
Pt left a message stating that she has an appt on 05-28-13 and would like to know if she should do labs prior to visit.  I looked at last note and it doesn't say anything about coming in for labs prior to visit and pt had them drawn on 01-22-13. I informed pt that if MD would like to do any labs we could do them after her appt.  Pt voiced understanding

## 2013-05-28 ENCOUNTER — Encounter: Payer: Self-pay | Admitting: Family Medicine

## 2013-05-28 ENCOUNTER — Ambulatory Visit (INDEPENDENT_AMBULATORY_CARE_PROVIDER_SITE_OTHER): Payer: BC Managed Care – PPO | Admitting: Family Medicine

## 2013-05-28 VITALS — BP 102/68 | HR 71 | Temp 98.6°F | Ht 62.5 in | Wt 192.0 lb

## 2013-05-28 DIAGNOSIS — Z23 Encounter for immunization: Secondary | ICD-10-CM

## 2013-05-28 DIAGNOSIS — R3589 Other polyuria: Secondary | ICD-10-CM

## 2013-05-28 DIAGNOSIS — R053 Chronic cough: Secondary | ICD-10-CM

## 2013-05-28 DIAGNOSIS — E785 Hyperlipidemia, unspecified: Secondary | ICD-10-CM

## 2013-05-28 DIAGNOSIS — R059 Cough, unspecified: Secondary | ICD-10-CM

## 2013-05-28 DIAGNOSIS — E119 Type 2 diabetes mellitus without complications: Secondary | ICD-10-CM

## 2013-05-28 DIAGNOSIS — R358 Other polyuria: Secondary | ICD-10-CM

## 2013-05-28 DIAGNOSIS — I10 Essential (primary) hypertension: Secondary | ICD-10-CM

## 2013-05-28 DIAGNOSIS — J329 Chronic sinusitis, unspecified: Secondary | ICD-10-CM

## 2013-05-28 DIAGNOSIS — R05 Cough: Secondary | ICD-10-CM

## 2013-05-28 DIAGNOSIS — K219 Gastro-esophageal reflux disease without esophagitis: Secondary | ICD-10-CM

## 2013-05-28 LAB — LIPID PANEL
Cholesterol: 153 mg/dL (ref 0–200)
HDL: 42 mg/dL (ref >39)
LDL Cholesterol: 93 mg/dL (ref 0–99)

## 2013-05-28 LAB — HEPATIC FUNCTION PANEL
ALT: 34 U/L (ref 0–35)
Alkaline Phosphatase: 78 U/L (ref 39–117)
Bilirubin, Direct: 0.1 mg/dL (ref 0.0–0.3)
Indirect Bilirubin: 0.3 mg/dL (ref 0.0–0.9)

## 2013-05-28 LAB — CBC
HCT: 39 % (ref 36.0–46.0)
Hemoglobin: 13.2 g/dL (ref 12.0–15.0)
MCH: 25.9 pg — ABNORMAL LOW (ref 26.0–34.0)
MCHC: 33.8 g/dL (ref 30.0–36.0)
MCV: 76.5 fL — ABNORMAL LOW (ref 78.0–100.0)
RBC: 5.1 MIL/uL (ref 3.87–5.11)
RDW: 14.1 % (ref 11.5–15.5)

## 2013-05-28 LAB — HEMOGLOBIN A1C
Hgb A1c MFr Bld: 7.3 % — ABNORMAL HIGH (ref <5.7)
Mean Plasma Glucose: 163 mg/dL — ABNORMAL HIGH (ref <117)

## 2013-05-28 MED ORDER — DOXYCYCLINE HYCLATE 100 MG PO TABS: 100.0000 mg | ORAL_TABLET | Freq: Two times a day (BID) | ORAL | Status: DC

## 2013-05-28 NOTE — Patient Instructions (Signed)
Hold Atorvastatin for a month, if leg gets better retry if pain returns stop and call If pain never stops restart Will switch statin if pain returns Mucinex 600 mg twice a day x 10 day Hydrate Zinc, Coldeeze, Xicam Probiotic such as Digestive Advantage  Sinusitis Sinusitis is redness, soreness, and swelling (inflammation) of the paranasal sinuses. Paranasal sinuses are air pockets within the bones of your face (beneath the eyes, the middle of the forehead, or above the eyes). In healthy paranasal sinuses, mucus is able to drain out, and air is able to circulate through them by way of your nose. However, when your paranasal sinuses are inflamed, mucus and air can become trapped. This can allow bacteria and other germs to grow and cause infection. Sinusitis can develop quickly and last only a short time (acute) or continue over a long period (chronic). Sinusitis that lasts for more than 12 weeks is considered chronic.  CAUSES  Causes of sinusitis include:  Allergies.  Structural abnormalities, such as displacement of the cartilage that separates your nostrils (deviated septum), which can decrease the air flow through your nose and sinuses and affect sinus drainage.  Functional abnormalities, such as when the small hairs (cilia) that line your sinuses and help remove mucus do not work properly or are not present. SYMPTOMS  Symptoms of acute and chronic sinusitis are the same. The primary symptoms are pain and pressure around the affected sinuses. Other symptoms include:  Upper toothache.  Earache.  Headache.  Bad breath.  Decreased sense of smell and taste.  A cough, which worsens when you are lying flat.  Fatigue.  Fever.  Thick drainage from your nose, which often is green and may contain pus (purulent).  Swelling and warmth over the affected sinuses. DIAGNOSIS  Your caregiver will perform a physical exam. During the exam, your caregiver may:  Look in your nose for signs of  abnormal growths in your nostrils (nasal polyps).  Tap over the affected sinus to check for signs of infection.  View the inside of your sinuses (endoscopy) with a special imaging device with a light attached (endoscope), which is inserted into your sinuses. If your caregiver suspects that you have chronic sinusitis, one or more of the following tests may be recommended:  Allergy tests.  Nasal culture A sample of mucus is taken from your nose and sent to a lab and screened for bacteria.  Nasal cytology A sample of mucus is taken from your nose and examined by your caregiver to determine if your sinusitis is related to an allergy. TREATMENT  Most cases of acute sinusitis are related to a viral infection and will resolve on their own within 10 days. Sometimes medicines are prescribed to help relieve symptoms (pain medicine, decongestants, nasal steroid sprays, or saline sprays).  However, for sinusitis related to a bacterial infection, your caregiver will prescribe antibiotic medicines. These are medicines that will help kill the bacteria causing the infection.  Rarely, sinusitis is caused by a fungal infection. In theses cases, your caregiver will prescribe antifungal medicine. For some cases of chronic sinusitis, surgery is needed. Generally, these are cases in which sinusitis recurs more than 3 times per year, despite other treatments. HOME CARE INSTRUCTIONS   Drink plenty of water. Water helps thin the mucus so your sinuses can drain more easily.  Use a humidifier.  Inhale steam 3 to 4 times a day (for example, sit in the bathroom with the shower running).  Apply a warm, moist washcloth to  your face 3 to 4 times a day, or as directed by your caregiver.  Use saline nasal sprays to help moisten and clean your sinuses.  Take over-the-counter or prescription medicines for pain, discomfort, or fever only as directed by your caregiver. SEEK IMMEDIATE MEDICAL CARE IF:  You have increasing  pain or severe headaches.  You have nausea, vomiting, or drowsiness.  You have swelling around your face.  You have vision problems.  You have a stiff neck.  You have difficulty breathing. MAKE SURE YOU:   Understand these instructions.  Will watch your condition.  Will get help right away if you are not doing well or get worse. Document Released: 05/31/2005 Document Revised: 08/23/2011 Document Reviewed: 06/15/2011 [facility] Patient Information 2014 [place], [place].

## 2013-05-28 NOTE — Progress Notes (Signed)
Pre visit review using our clinic review tool, if applicable. No additional management support is needed unless otherwise documented below in the visit note. 

## 2013-05-29 LAB — URINALYSIS
Glucose, UA: >1000 mg/dL — AB
Hgb urine dipstick: NEGATIVE
Leukocytes, UA: NEGATIVE
Nitrite: NEGATIVE
Specific Gravity, Urine: 1.026 (ref 1.005–1.030)
pH: 5 (ref 5.0–8.0)

## 2013-05-29 LAB — MICROALBUMIN / CREATININE URINE RATIO: Microalb Creat Ratio: 5.3 mg/g (ref 0.0–30.0)

## 2013-05-31 ENCOUNTER — Telehealth: Payer: Self-pay | Admitting: Family Medicine

## 2013-05-31 NOTE — Telephone Encounter (Signed)
PLEASE CALL PATIENT WITH TEST RESULTS

## 2013-06-01 NOTE — Telephone Encounter (Signed)
Letter was mailed to pt.  

## 2013-06-01 NOTE — Telephone Encounter (Signed)
I called patient and she states she received letter yesterday

## 2013-06-03 ENCOUNTER — Encounter: Payer: Self-pay | Admitting: Family Medicine

## 2013-06-03 NOTE — Assessment & Plan Note (Signed)
Well controlled, no changes, encouraged DASH diet 

## 2013-06-03 NOTE — Assessment & Plan Note (Signed)
Avoid offending foods, continue Ranitidine as needed

## 2013-06-03 NOTE — Assessment & Plan Note (Signed)
Encouraged avoid trans fats, continue fatty acid supplements and Lipitor

## 2013-06-03 NOTE — Assessment & Plan Note (Addendum)
utd on flu shot, given Prevnar shot today, well controlled on current meds

## 2013-06-03 NOTE — Progress Notes (Signed)
Patient ID: ARITA SEVERTSON, female   DOB: Dec 04, 1950, 62 y.o.   MRN: [id number] SACHEEN ARRASMITH [id number] 17-May-1951 06/03/2013      Progress Note-Follow Up  Subjective  Chief Complaint  Chief Complaint  Patient presents with  . Follow-up    4 month  . Injections    prevnar    HPI  Patient is a 62 year old female who is in today for followup. She's been struggling with congestion about 2 weeks. She has had congestion productive of clear to yellow phlegm. She describes an intermittent sore throat and postnasal drip. Also notes malaise and myalgias. Denies chest pain or palpitations. No shortness of breath. No diarrhea or constipation. Is complaining of some persistent left leg pain which is slowly worsening over time as well as some mild polyuria but no polydipsia.  Past Medical History  Diagnosis Date  . Diaphragmatic hernia without mention of obstruction or gangrene   . Stricture and stenosis of esophagus   . Nonspecific abnormal results of liver function study   . Plantar fascial fibromatosis   . Esophageal reflux   . Unspecified sleep apnea   . Hyperlipidemia   . Diabetes mellitus 52    type 2  . Hypertension 52  . Allergy     seasonal  . Endometriosis   . Arthritis     knee-left  . Depression     job/stress related  . Pharyngitis 02/18/2012  . Nonalcoholic fatty liver disease 04/28/2010    Qualifier: Diagnosis of  By: Lovell Sheehan MD, Bal[facility] 01/22/2013    Past Surgical History  Procedure Laterality Date  . Appendectomy    . Benigh cyst in lung  2006    right  . Colonoscopy    . Polypectomy      hyperplastic  . Upper gastrointestinal endoscopy    . Abdominal hysterectomy  1982    partial  . Tubal ligation    . Seba      Family History  Problem Relation Age of Onset  . Breast cancer Mother   . Cancer Mother     breast  . Hypertension Mother   . Diabetes Mother     type 2  . Heart disease Father   . Hyperlipidemia Father    . Hypertension Father   . Heart attack Father     MI at age 57  . Diabetes Brother     type 2  . Heart disease Maternal Grandmother   . Heart attack Maternal Grandfather   . Hypertension Maternal Grandfather   . Stroke Paternal Grandmother   . Hypertension Sister   . Hypertension Brother     History   Social History  . Marital Status: Married    Spouse Name: Marcial Pacas    Number of Children: 2  . Years of Education: N/A   Occupational History  . Engineer, mining    Social History Main Topics  . Smoking status: Never Smoker   . Smokeless tobacco: Never Used  . Alcohol Use: No  . Drug Use: No  . Sexual Activity: Yes   Other Topics Concern  . Not on file   Social History Narrative  . No narrative on file    Current Outpatient Prescriptions on File Prior to Visit  Medication Sig Dispense Refill  . aspirin EC 81 MG tablet TAKE 1 TABLET (81 MG TOTAL) BY MOUTH DAILY. SWALLOW WHOLE.  30 tablet  11  . atorvastatin (LIPITOR) 10 MG tablet TAKE  1 TABLET (10 MG TOTAL) BY MOUTH DAILY.  30 tablet  6  . BAYER CONTOUR TEST test strip USE AS INSTRUCTED  100 each  1  . bisoprolol-hydrochlorothiazide (ZIAC) 5-6.25 MG per tablet TAKE 1 TABLET BY MOUTH DAILY.  30 tablet  3  . esomeprazole (NEXIUM) 40 MG capsule Take 1 capsule (40 mg total) by mouth daily before breakfast.  30 capsule  5  . fluticasone (FLONASE) 50 MCG/ACT nasal spray Place 2 sprays into the nose daily.  16 g  6  . INVOKANA 300 MG TABS TAKE 1 TABLET (300 MG TOTAL) BY MOUTH DAILY.  30 tablet  2  . metFORMIN (GLUCOPHAGE) 1000 MG tablet TAKE 1 TABLET BY MOUTH TWICE A DAY  60 tablet  11  . nitroGLYCERIN (NITROSTAT) 0.4 MG SL tablet Place 1 tablet (0.4 mg total) under the tongue every 5 (five) minutes as needed for chest pain. After 3 doses proceed to ER for further evaluation if now resolution  25 tablet  1  . Omega-3 Fatty Acids (FISH OIL PO) Take 1 tablet by mouth daily.      . ranitidine (ZANTAC) 150 MG tablet TAKE 1 TABLET  (150 MG TOTAL) BY MOUTH AT BEDTIME.  30 tablet  5   No current facility-administered medications on file prior to visit.    Allergies  Allergen Reactions  . Amoxicillin     REACTION: rash  . Codeine     Review of Systems  Review of Systems  Constitutional: Negative for fever and malaise/fatigue.  HENT: Positive for congestion.   Eyes: Negative for discharge.  Respiratory: Positive for cough and sputum production. Negative for shortness of breath.   Cardiovascular: Negative for chest pain, palpitations and leg swelling.  Gastrointestinal: Negative for nausea, abdominal pain and diarrhea.  Genitourinary: Negative for dysuria.  Musculoskeletal: Negative for falls.  Skin: Negative for rash.  Neurological: Negative for loss of consciousness and headaches.  Endo/Heme/Allergies: Negative for polydipsia.  Psychiatric/Behavioral: Negative for depression and suicidal ideas. The patient is not nervous/anxious and does not have insomnia.     Objective  BP 102/68  Pulse 71  Temp(Src) 98.6 F (37 C) (Oral)  Ht 5' 2.5" (1.588 m)  Wt 192 lb (87.091 kg)  BMI 34.54 kg/m2  SpO2 94%  Physical Exam  Physical Exam  Constitutional: She is oriented to person, place, and time and well-developed, well-nourished, and in no distress. No distress.  HENT:  Head: Normocephalic and atraumatic.  Eyes: Conjunctivae are normal.  Neck: Neck supple. No thyromegaly present.  Cardiovascular: Normal rate, regular rhythm and normal heart sounds.   No murmur heard. Pulmonary/Chest: Effort normal and breath sounds normal. She has no wheezes.  Abdominal: She exhibits no distension and no mass.  Musculoskeletal: She exhibits no edema.  Lymphadenopathy:    She has no cervical adenopathy.  Neurological: She is alert and oriented to person, place, and time.  Skin: Skin is warm and dry. No rash noted. She is not diaphoretic.  Psychiatric: Memory, affect and judgment normal.    Lab Results  Component  Value Date   TSH 0.970 05/28/2013   Lab Results  Component Value Date   WBC 5.9 05/28/2013   HGB 13.2 05/28/2013   HCT 39.0 05/28/2013   MCV 76.5* 05/28/2013   PLT 263 05/28/2013   Lab Results  Component Value Date   CREATININE 0.88 01/22/2013   BUN 11 01/22/2013   NA 139 01/22/2013   K 4.3 01/22/2013   CL 100 01/22/2013  CO2 26 01/22/2013   Lab Results  Component Value Date   ALT 34 05/28/2013   AST 22 05/28/2013   ALKPHOS 78 05/28/2013   BILITOT 0.4 05/28/2013   Lab Results  Component Value Date   CHOL 153 05/28/2013   Lab Results  Component Value Date   HDL 42 05/28/2013   Lab Results  Component Value Date   LDLCALC 93 05/28/2013   Lab Results  Component Value Date   TRIG 90 05/28/2013   Lab Results  Component Value Date   CHOLHDL 3.6 05/28/2013     Assessment & Plan  HYPERTENSION Well controlled, no changes, encouraged DASH diet.   DIABETES MELLITUS, TYPE II utd on flu shot, given Prevnar shot today, well controlled on current meds  HYPERLIPIDEMIA Encouraged avoid trans fats, continue fatty acid supplements and Lipitor  GERD Avoid offending foods, continue Ranitidine as needed

## 2013-08-06 ENCOUNTER — Encounter: Payer: Self-pay | Admitting: Nurse Practitioner

## 2013-08-06 ENCOUNTER — Ambulatory Visit (INDEPENDENT_AMBULATORY_CARE_PROVIDER_SITE_OTHER): Payer: 59 | Admitting: Nurse Practitioner

## 2013-08-06 VITALS — BP 107/73 | HR 70 | Temp 98.1°F | Resp 16 | Ht 62.5 in | Wt 193.0 lb

## 2013-08-06 DIAGNOSIS — M546 Pain in thoracic spine: Secondary | ICD-10-CM

## 2013-08-06 MED ORDER — METHYLPREDNISOLONE ACETATE 40 MG/ML INJ SUSP (RADIOLOG
120.0000 mg | Freq: Once | INTRAMUSCULAR | Status: AC
  Administered 2013-08-06: 120 mg via INTRAMUSCULAR

## 2013-08-06 MED ORDER — CELECOXIB 100 MG PO CAPS: 100.0000 mg | ORAL_CAPSULE | Freq: Two times a day (BID) | ORAL | Status: DC

## 2013-08-06 MED ORDER — METHYLPREDNISOLONE ACETATE 40 MG/ML IJ SUSP
40.0000 mg | Freq: Once | INTRAMUSCULAR | Status: AC
Start: 2013-08-06 — End: 2013-08-06
  Administered 2013-08-06: 40 mg via INTRAMUSCULAR

## 2013-08-06 NOTE — Addendum Note (Signed)
Addended by: Jannette Spanner on: 08/06/2013 02:24 PM   Modules accepted: Orders

## 2013-08-06 NOTE — Progress Notes (Signed)
Subjective:    [name] is a 63 y.o. female who presents for evaluation of thoracic back pain. Pt states pain is intermittent, located R scapula, does not radiate. Pain lasts for a few seconds & is severe-"will drop me to my feet". Pain is worse w/various movement & deep inspiration. Symptoms have been present for 1 month and got better for a while, then came back with same severity again. Onset was related to / precipitated by lifting a heavy object and thinks she may have been lifting heavy boxes. Symptoms are improved by acetaminophen, heat and excedrin. She has Hx of lumbar degenerative disc disease and history of thoracic cyst removal, accessed through posterior back.  The following portions of the patient's history were reviewed and updated as appropriate: allergies, current medications, past medical history, past social history, past surgical history and problem list.  Review of Systems Constitutional: negative for fevers Respiratory: negative for cough Cardiovascular: negative for chest pressure/discomfort, irregular heart beat and lower extremity edema Musculoskeletal:positive for back pain Neurological: negative for coordination problems, paresthesia and weakness    Objective:   Inspection and palpation: scoliosis noted thoracic spine: R shoulder & scapula higher than left, no tenderness noted. FROM in R shoulder     Assessment:    musculoskeletal pain: mild thoracic scoliosis on exam. Hx lumbar deg disc disease. FROM, no radiculaopathy.    Plan:    Natural history and expected course discussed. Questions answered. Neurosurgeon distributed. Proper lifting, bending technique discussed. Stretching exercises discussed. Heat to affected area as needed for local pain relief. NSAIDs per medication orders. Follow-up in PRN no relief w/conservative measures .Marland Kitchen  40 mg depomedrol in ofc today. Adv avoid sugar 2 days. Start celebrex. Daily heat & stretches. Pt declined  xrays, PT & ref. To ortho. Plan: she will cb if no relief, order t-spine films, consider pt or ref to Spine & scoliosis specialists as appropriate.

## 2013-08-06 NOTE — Patient Instructions (Addendum)
Try celebrex 1 to 2 times daily. Use moist heat several times daily. Perform gentle stretches of arm, shoulder & upper back as discussed several times daily. Do not eat sugar today or tomorrow as steroid can increase blood sugar temporarily. Let us know if no relief. We can order xrays, do physical therapy, and/or send you to Sp[facility] sc[facility]osis specialists of [facility].  Scoliosis Scoliosis is the name given to a spine that curves sideways.Scoliosis can cause twisting of your shoulders, hips, chest, back, and rib cage.  CAUSES  The cause of scoliosis is not always known. It may be caused by a birth defect or by a disease that can cause muscular dysfunction and imbalance, such as cerebral palsy and muscular dystrophy.  RISK FACTORS Having a disease that causes muscle disease or dysfunction. SIGNS AND SYMPTOMS Scoliosis often has no signs or symptoms.If they are present, they may include:  Unequal size of one body side compared to the other (asymmetry).  Visible curvature of the spine.  Pain. The pain may limit physical activity.  Shortness of breath.  Bowel or bladder issues. DIAGNOSIS A skilled health care provider will perform an evaluation. This will involve:  Taking your history.  Performing a physical examination.  Performing neurological exam to detect nerve or muscle function loss.  Range of motion studies on the spine.  X-rays. An MRI may also be obtained. TREATMENT  Treatment varies depending on the nature, extent, and severity of the disease. If the curvature is not great, you may need only observation. A brace may be used to prevent scoliosis from progressing. A brace may also be needed during growth spurts. Physical therapy may be of benefit. Surgery may be required.  HOME CARE INSTRUCTIONS   Your health care provider may suggest exercises to strengthen your muscles. Perform them as directed.  Ask your health care provider before participating in any sports.    If you have been prescribed an orthopedic brace, wear it as instructed by your health care provider. SEEK MEDICAL CARE IF: Your brace causes the skin to become sore (chafe) or is uncomfortable.  SEEK IMMEDIATE MEDICAL CARE IF:  You have back pain that is not relieved by the medicines prescribed by your health care provider.   Your legs feel weak or you lose function in your legs.  You lose some bowel or bladder control.  Document Released: 05/28/2000 Document Revised: 03/21/2013 Document Reviewed: 02/05/2013 [facility] Patient Information 2014 [place].  Back Pain, Adult Low back pain is very common. About 1 in 5 people have back pain.The cause of low back pain is rarely dangerous. The pain often gets better over time.About half of people with a sudden onset of back pain feel better in just 2 weeks. About 8 in 10 people feel better by 6 weeks.  CAUSES Some common causes of back pain include:  Strain of the muscles or ligaments supporting the spine.  Wear and tear (degeneration) of the spinal discs.  Arthritis.  Direct injury to the back. DIAGNOSIS Most of the time, the direct cause of low back pain is not known.However, back pain can be treated effectively even when the exact cause of the pain is unknown.Answering your caregiver's questions about your overall health and symptoms is one of the most accurate ways to make sure the cause of your pain is not dangerous. If your caregiver needs more information, he or she may order lab work or imaging tests (X-rays or MRIs).However, even if imaging tests show changes in  your back, this usually does not require surgery. HOME CARE INSTRUCTIONS For many people, back pain returns.Since low back pain is rarely dangerous, it is often a condition that people can learn to [facility] their own.   Remain active. It is stressful on the back to sit or stand in one place. Do not sit, drive, or stand in one place for more than 30 minutes  at a time. Take short walks on level surfaces as soon as pain allows.Try to increase the length of time you walk each day.  Do not stay in bed.Resting more than 1 or 2 days can delay your recovery.  Do not avoid exercise or work.Your body is made to move.It is not dangerous to be active, even though your back may hurt.Your back will likely heal faster if you return to being active before your pain is gone.  Pay attention to your body when you bend and lift. Many people have less discomfortwhen lifting if they bend their knees, keep the load close to their bodies,and avoid twisting. Often, the most comfortable positions are those that put less stress on your recovering back.  Find a comfortable position to sleep. Use a firm mattress and lie on your side with your knees slightly bent. If you lie on your back, put a pillow under your knees.  Only take over-the-counter or prescription medicines as directed by your caregiver. Over-the-counter medicines to reduce pain and inflammation are often the most helpful.Your caregiver may prescribe muscle relaxant drugs.These medicines help dull your pain so you can more quickly return to your normal activities and healthy exercise.  Put ice on the injured area.  Put ice in a plastic bag.  Place a towel between your skin and the bag.  Leave the ice on for 15-20 minutes, 03-04 times a day for the first 2 to 3 days. After that, ice and heat may be alternated to reduce pain and spasms.  Ask your caregiver about trying back exercises and gentle massage. This may be of some benefit.  Avoid feeling anxious or stressed.Stress increases muscle tension and can worsen back pain.It is important to recognize when you are anxious or stressed and learn ways to manage it.Exercise is a great option. SEEK MEDICAL CARE IF:  You have pain that is not relieved with rest or medicine.  You have pain that does not improve in 1 week.  You have new  symptoms.  You are generally not feeling well. SEEK IMMEDIATE MEDICAL CARE IF:   You have pain that radiates from your back into your legs.  You develop new bowel or bladder control problems.  You have unusual weakness or numbness in your arms or legs.  You develop nausea or vomiting.  You develop abdominal pain.  You feel faint. Document Released: 05/31/2005 Document Revised: 11/30/2011 Document Reviewed: 10/19/2010 [facility] Patient Information 2014 [place], [place].

## 2013-08-06 NOTE — Progress Notes (Signed)
Pre visit review using our clinic review tool, if applicable. No additional management support is needed unless otherwise documented below in the visit note. 

## 2013-08-09 ENCOUNTER — Other Ambulatory Visit: Payer: Self-pay | Admitting: Family Medicine

## 2013-09-11 ENCOUNTER — Other Ambulatory Visit: Payer: Self-pay | Admitting: Family Medicine

## 2013-09-27 ENCOUNTER — Ambulatory Visit (INDEPENDENT_AMBULATORY_CARE_PROVIDER_SITE_OTHER): Payer: 59 | Admitting: Family Medicine

## 2013-09-27 VITALS — BP 108/68 | HR 64 | Temp 98.4°F | Ht 62.25 in | Wt 192.0 lb

## 2013-09-27 DIAGNOSIS — E119 Type 2 diabetes mellitus without complications: Secondary | ICD-10-CM

## 2013-09-27 DIAGNOSIS — K219 Gastro-esophageal reflux disease without esophagitis: Secondary | ICD-10-CM

## 2013-09-27 DIAGNOSIS — I10 Essential (primary) hypertension: Secondary | ICD-10-CM

## 2013-09-27 DIAGNOSIS — E785 Hyperlipidemia, unspecified: Secondary | ICD-10-CM

## 2013-09-27 LAB — LIPID PANEL
Cholesterol: 227 mg/dL — ABNORMAL HIGH (ref 0–200)
HDL: 48 mg/dL (ref >39)
LDL Cholesterol: 148 mg/dL — ABNORMAL HIGH (ref 0–99)
Total CHOL/HDL Ratio: 4.7 Ratio
Triglycerides: 156 mg/dL — ABNORMAL HIGH (ref <150)
VLDL: 31 mg/dL (ref 0–40)

## 2013-09-27 LAB — HEMOGLOBIN A1C
Hgb A1c MFr Bld: 6.9 % — ABNORMAL HIGH (ref <5.7)
MEAN PLASMA GLUCOSE: 151 mg/dL — AB (ref <117)

## 2013-09-27 LAB — RENAL FUNCTION PANEL
ALBUMIN: 4.4 g/dL (ref 3.5–5.2)
BUN: 18 mg/dL (ref 6–23)
CO2: 30 mEq/L (ref 19–32)
CREATININE: 0.85 mg/dL (ref 0.50–1.10)
Calcium: 10.4 mg/dL (ref 8.4–10.5)
Chloride: 100 mEq/L (ref 96–112)
Glucose, Bld: 109 mg/dL — ABNORMAL HIGH (ref 70–99)
PHOSPHORUS: 3.8 mg/dL (ref 2.3–4.6)
Potassium: 4.5 mEq/L (ref 3.5–5.3)
Sodium: 139 mEq/L (ref 135–145)

## 2013-09-27 LAB — CBC
HCT: 40.9 % (ref 36.0–46.0)
Hemoglobin: 14.1 g/dL (ref 12.0–15.0)
MCH: 26.2 pg (ref 26.0–34.0)
MCHC: 34.5 g/dL (ref 30.0–36.0)
MCV: 75.9 fL — ABNORMAL LOW (ref 78.0–100.0)
PLATELETS: 292 10*3/uL (ref 150–400)
RBC: 5.39 MIL/uL — ABNORMAL HIGH (ref 3.87–5.11)
RDW: 14.7 % (ref 11.5–15.5)
WBC: 6.5 10*3/uL (ref 4.0–10.5)

## 2013-09-27 LAB — HEPATIC FUNCTION PANEL
ALBUMIN: 4.4 g/dL (ref 3.5–5.2)
ALT: 39 U/L — ABNORMAL HIGH (ref 0–35)
AST: 26 U/L (ref 0–37)
Alkaline Phosphatase: 80 U/L (ref 39–117)
BILIRUBIN TOTAL: 0.5 mg/dL (ref 0.2–1.2)
Bilirubin, Direct: 0.1 mg/dL (ref 0.0–0.3)
Indirect Bilirubin: 0.4 mg/dL (ref 0.2–1.2)
Total Protein: 7.7 g/dL (ref 6.0–8.3)

## 2013-09-27 LAB — TSH: TSH: 1.174 u[IU]/mL (ref 0.350–4.500)

## 2013-09-27 NOTE — Progress Notes (Signed)
Pre visit review using our clinic review tool, if applicable. No additional management support is needed unless otherwise documented below in the visit note. 

## 2013-09-27 NOTE — Patient Instructions (Signed)

## 2013-09-28 LAB — MICROALBUMIN / CREATININE URINE RATIO
CREATININE, URINE: 84.5 mg/dL
MICROALB UR: 0.5 mg/dL (ref 0.00–1.89)
MICROALB/CREAT RATIO: 5.9 mg/g (ref 0.0–30.0)

## 2013-09-28 MED ORDER — PRAVASTATIN SODIUM 10 MG PO TABS: 10.0000 mg | ORAL_TABLET | Freq: Every day | ORAL | Status: DC

## 2013-09-30 ENCOUNTER — Encounter: Payer: Self-pay | Admitting: Family Medicine

## 2013-09-30 NOTE — Assessment & Plan Note (Signed)
Well controlled, no changes to meds. Encouraged heart healthy diet such as the DASH diet and exercise as tolerated.  °

## 2013-09-30 NOTE — Progress Notes (Signed)
Patient ID: [name], female   DOB: 02-02-1951, 63 y.o.   MRN: [id number] [name] [id number] March 01, 1951 09/30/2013      Progress Note-Follow Up  Subjective  Chief Complaint  Chief Complaint  Patient presents with  . Follow-up    4 month    HPI  Patient is a 63 year old female in today for routine medical care. She notes her blood sugars have been ranging from 140 to the high 200s. Mild polyuria and polydipsia. History sounds. Atorvastatin due to knee pain and the pain improved.Denies CP/palp/SOB/HA/congestion/fevers/GI or GU c/o. Taking meds as prescribed  Past Medical History  Diagnosis Date  . Diaphragmatic hernia without mention of obstruction or gangrene   . Stricture and stenosis of esophagus   . Nonspecific abnormal results of liver function study   . Plantar fascial fibromatosis   . Esophageal reflux   . Unspecified sleep apnea   . Hyperlipidemia   . Diabetes mellitus 52    type 2  . Hypertension 52  . Allergy     seasonal  . Endometriosis   . Arthritis     knee-left  . Depression     job/stress related  . Pharyngitis 02/18/2012  . Nonalcoholic fatty liver disease 04/28/2010    Qualifier: Diagnosis of  By: Arnoldo Morale MD, [facility] 01/22/2013    Past Surgical History  Procedure Laterality Date  . Appendectomy    . Benigh cyst in lung  2006    right  . Colonoscopy    . Polypectomy      hyperplastic  . Upper gastrointestinal endoscopy    . Abdominal hysterectomy  1982    partial  . Tubal ligation    . Seba      Family History  Problem Relation Age of Onset  . Breast cancer Mother   . Cancer Mother     breast  . Hypertension Mother   . Diabetes Mother     type 2  . Heart disease Father   . Hyperlipidemia Father   . Hypertension Father   . Heart attack Father     MI at age 32  . Diabetes Brother     type 2  . Heart disease Maternal Grandmother   . Heart attack Maternal Grandfather   . Hypertension Maternal  Grandfather   . Stroke Paternal Grandmother   . Hypertension Sister   . Hypertension Brother     History   Social History  . Marital Status: Married    Spouse Name: [name]    Number of Children: 2  . Years of Education: N/A   Occupational History  . Designer, television/film set    Social History Main Topics  . Smoking status: Never Smoker   . Smokeless tobacco: Never Used  . Alcohol Use: No  . Drug Use: No  . Sexual Activity: Yes   Other Topics Concern  . Not on file   Social History Narrative  . No narrative on file    Current Outpatient Prescriptions on File Prior to Visit  Medication Sig Dispense Refill  . aspirin EC 81 MG tablet TAKE 1 TABLET (81 MG TOTAL) BY MOUTH DAILY. SWALLOW WHOLE.  30 tablet  11  . BAYER CONTOUR TEST test strip USE AS INSTRUCTED  100 each  1  . bisoprolol-hydrochlorothiazide (ZIAC) 5-6.25 MG per tablet TAKE 1 TABLET BY MOUTH DAILY.  30 tablet  3  . INVOKANA 300 MG TABS TAKE 1 TABLET (300 MG TOTAL)  BY MOUTH DAILY.  30 tablet  2  . metFORMIN (GLUCOPHAGE) 1000 MG tablet TAKE 1 TABLET BY MOUTH TWICE A DAY  60 tablet  11  . ranitidine (ZANTAC) 150 MG tablet TAKE 1 TABLET BY MOUTH AT BEDTIME.  30 tablet  5  . nitroGLYCERIN (NITROSTAT) 0.4 MG SL tablet Place 1 tablet (0.4 mg total) under the tongue every 5 (five) minutes as needed for chest pain. After 3 doses proceed to ER for further evaluation if now resolution  25 tablet  1   No current facility-administered medications on file prior to visit.    Allergies  Allergen Reactions  . Amoxicillin     REACTION: rash  . Codeine     Review of Systems  Review of Systems  Constitutional: Positive for malaise/fatigue. Negative for fever.  HENT: Negative for congestion.   Eyes: Negative for discharge.  Respiratory: Negative for shortness of breath.   Cardiovascular: Negative for chest pain, palpitations and leg swelling.  Gastrointestinal: Negative for nausea, abdominal pain and diarrhea.  Genitourinary:  Negative for dysuria.  Musculoskeletal: Positive for joint pain. Negative for falls.  Skin: Negative for rash.  Neurological: Negative for loss of consciousness and headaches.  Endo/Heme/Allergies: Negative for polydipsia.  Psychiatric/Behavioral: Negative for depression and suicidal ideas. The patient is not nervous/anxious and does not have insomnia.     Objective  BP 108/68  Pulse 64  Temp(Src) 98.4 F (36.9 C) (Oral)  Ht 5' 2.25" (1.581 m)  Wt 192 lb 0.6 oz (87.109 kg)  BMI 34.85 kg/m2  SpO2 96%  Physical Exam  Physical Exam  Constitutional: She is oriented to person, place, and time and well-developed, well-nourished, and in no distress. No distress.  HENT:  Head: Normocephalic and atraumatic.  Eyes: Conjunctivae are normal.  Neck: Neck supple. No thyromegaly present.  Cardiovascular: Normal rate, regular rhythm and normal heart sounds.   No murmur heard. Pulmonary/Chest: Effort normal and breath sounds normal. She has no wheezes.  Abdominal: She exhibits no distension and no mass.  Musculoskeletal: She exhibits no edema.  Lymphadenopathy:    She has no cervical adenopathy.  Neurological: She is alert and oriented to person, place, and time.  Skin: Skin is warm and dry. No rash noted. She is not diaphoretic.  Psychiatric: Memory, affect and judgment normal.    Lab Results  Component Value Date   TSH 1.174 09/27/2013   Lab Results  Component Value Date   WBC 6.5 09/27/2013   HGB 14.1 09/27/2013   HCT 40.9 09/27/2013   MCV 75.9* 09/27/2013   PLT 292 09/27/2013   Lab Results  Component Value Date   CREATININE 0.85 09/27/2013   BUN 18 09/27/2013   NA 139 09/27/2013   K 4.5 09/27/2013   CL 100 09/27/2013   CO2 30 09/27/2013   Lab Results  Component Value Date   ALT 39* 09/27/2013   AST 26 09/27/2013   ALKPHOS 80 09/27/2013   BILITOT 0.5 09/27/2013   Lab Results  Component Value Date   CHOL 227* 09/27/2013   Lab Results  Component Value Date   HDL 48 09/27/2013    Lab Results  Component Value Date   LDLCALC 148* 09/27/2013   Lab Results  Component Value Date   TRIG 156* 09/27/2013   Lab Results  Component Value Date   CHOLHDL 4.7 09/27/2013     Assessment & Plan  HYPERTENSION Well controlled, no changes to meds. Encouraged heart healthy diet such as the DASH diet and  exercise as tolerated.   HYPERLIPIDEMIA Stopped Simvastatin due to myalgias and knee pain, it improved when she did so. Is willing to try Pravastatin will notify us if myalgias worsen again  GERD Avoid offending foods, start probiotics. Do not eat large meals in late evening and consider raising head of bed.   DIABETES MELLITUS, TYPE II hgba1c acceptable, minimize simple carbs. Increase exercise as tolerated. Continue current meds

## 2013-09-30 NOTE — Assessment & Plan Note (Signed)
Stopped Simvastatin due to myalgias and knee pain, it improved when she did so. Is willing to try Pravastatin will notify us if myalgias worsen again

## 2013-09-30 NOTE — Assessment & Plan Note (Signed)
hgba1c acceptable, minimize simple carbs. Increase exercise as tolerated. Continue current meds 

## 2013-09-30 NOTE — Assessment & Plan Note (Signed)
Avoid offending foods, start probiotics. Do not eat large meals in late evening and consider raising head of bed.  

## 2013-10-10 ENCOUNTER — Telehealth: Payer: Self-pay

## 2013-10-10 NOTE — Telephone Encounter (Signed)
Relevant patient education assigned to patient using Emmi. ° °

## 2013-11-08 ENCOUNTER — Other Ambulatory Visit: Payer: Self-pay | Admitting: Family Medicine

## 2013-12-12 ENCOUNTER — Other Ambulatory Visit: Payer: Self-pay | Admitting: Family Medicine

## 2013-12-19 ENCOUNTER — Emergency Department (HOSPITAL_COMMUNITY): Payer: 59

## 2013-12-19 ENCOUNTER — Telehealth: Payer: Self-pay | Admitting: Family Medicine

## 2013-12-19 ENCOUNTER — Encounter (HOSPITAL_COMMUNITY): Payer: Self-pay | Admitting: Emergency Medicine

## 2013-12-19 ENCOUNTER — Emergency Department (HOSPITAL_COMMUNITY)
Admission: EM | Admit: 2013-12-19 | Discharge: 2013-12-19 | Disposition: A | Discharge status: Home / Self Care | Payer: 59 | Attending: Emergency Medicine | Admitting: Emergency Medicine

## 2013-12-19 DIAGNOSIS — M129 Arthropathy, unspecified: Secondary | ICD-10-CM | POA: Insufficient documentation

## 2013-12-19 DIAGNOSIS — K219 Gastro-esophageal reflux disease without esophagitis: Secondary | ICD-10-CM | POA: Insufficient documentation

## 2013-12-19 DIAGNOSIS — Z79899 Other long term (current) drug therapy: Secondary | ICD-10-CM | POA: Insufficient documentation

## 2013-12-19 DIAGNOSIS — R1011 Right upper quadrant pain: Secondary | ICD-10-CM | POA: Insufficient documentation

## 2013-12-19 DIAGNOSIS — Z7982 Long term (current) use of aspirin: Secondary | ICD-10-CM | POA: Insufficient documentation

## 2013-12-19 DIAGNOSIS — E119 Type 2 diabetes mellitus without complications: Secondary | ICD-10-CM | POA: Insufficient documentation

## 2013-12-19 DIAGNOSIS — K2100 Gastro-esophageal reflux disease with esophagitis, without bleeding: Secondary | ICD-10-CM

## 2013-12-19 DIAGNOSIS — Z9889 Other specified postprocedural states: Secondary | ICD-10-CM | POA: Insufficient documentation

## 2013-12-19 DIAGNOSIS — I10 Essential (primary) hypertension: Secondary | ICD-10-CM | POA: Insufficient documentation

## 2013-12-19 DIAGNOSIS — R0789 Other chest pain: Secondary | ICD-10-CM

## 2013-12-19 DIAGNOSIS — R1013 Epigastric pain: Secondary | ICD-10-CM | POA: Insufficient documentation

## 2013-12-19 DIAGNOSIS — Z8659 Personal history of other mental and behavioral disorders: Secondary | ICD-10-CM | POA: Insufficient documentation

## 2013-12-19 DIAGNOSIS — Z880 Allergy status to penicillin: Secondary | ICD-10-CM | POA: Insufficient documentation

## 2013-12-19 DIAGNOSIS — Z8742 Personal history of other diseases of the female genital tract: Secondary | ICD-10-CM | POA: Insufficient documentation

## 2013-12-19 LAB — CBC
HCT: 40.3 % (ref 36.0–46.0)
HEMOGLOBIN: 13.5 g/dL (ref 12.0–15.0)
MCH: 26.8 pg (ref 26.0–34.0)
MCHC: 33.5 g/dL (ref 30.0–36.0)
MCV: 80.1 fL (ref 78.0–100.0)
PLATELETS: 231 10*3/uL (ref 150–400)
RBC: 5.03 MIL/uL (ref 3.87–5.11)
RDW: 13.6 % (ref 11.5–15.5)
WBC: 6.1 10*3/uL (ref 4.0–10.5)

## 2013-12-19 LAB — COMPREHENSIVE METABOLIC PANEL
ALK PHOS: 77 U/L (ref 39–117)
ALT: 35 U/L (ref 0–35)
AST: 23 U/L (ref 0–37)
Albumin: 3.8 g/dL (ref 3.5–5.2)
Anion gap: 16 — ABNORMAL HIGH (ref 5–15)
BUN: 17 mg/dL (ref 6–23)
CALCIUM: 9.6 mg/dL (ref 8.4–10.5)
CO2: 23 meq/L (ref 19–32)
Chloride: 100 mEq/L (ref 96–112)
Creatinine, Ser: 0.73 mg/dL (ref 0.50–1.10)
GFR calc Af Amer: >90 mL/min (ref >90)
Glucose, Bld: 123 mg/dL — ABNORMAL HIGH (ref 70–99)
POTASSIUM: 4.3 meq/L (ref 3.7–5.3)
SODIUM: 139 meq/L (ref 137–147)
Total Bilirubin: 0.4 mg/dL (ref 0.3–1.2)
Total Protein: 7.4 g/dL (ref 6.0–8.3)

## 2013-12-19 LAB — I-STAT TROPONIN, ED: Troponin i, poc: 0 ng/mL (ref 0.00–0.08)

## 2013-12-19 MED ORDER — ESOMEPRAZOLE MAGNESIUM 40 MG PO CPDR: 40.0000 mg | DELAYED_RELEASE_CAPSULE | Freq: Every day | ORAL | Status: DC

## 2013-12-19 MED ORDER — ONDANSETRON 4 MG PO TBDP: ORAL_TABLET | ORAL | Status: DC

## 2013-12-19 NOTE — ED Notes (Signed)
PA student at the bedside.  

## 2013-12-19 NOTE — Telephone Encounter (Signed)
FYI: Pt to go to ED per [facility] note/sls

## 2013-12-19 NOTE — ED Provider Notes (Signed)
Medical screening examination/treatment/procedure(s) were performed by non-physician practitioner and as supervising physician I was immediately available for consultation/collaboration.   EKG Interpretation None        Hoy Morn, MD 12/19/13 [phone]

## 2013-12-19 NOTE — Discharge Instructions (Signed)
Take Nexium as prescribed daily. Use Zofran as directed as needed for nausea. Followup with your primary care physician.  Gastroesophageal Reflux Disease, Adult Gastroesophageal reflux disease (GERD) happens when acid from your stomach flows up into the esophagus. When acid comes in contact with the esophagus, the acid causes soreness (inflammation) in the esophagus. Over time, GERD may create small holes (ulcers) in the lining of the esophagus. CAUSES   Increased body weight. This puts pressure on the stomach, making acid rise from the stomach into the esophagus.  Smoking. This increases acid production in the stomach.  Drinking alcohol. This causes decreased pressure in the lower esophageal sphincter (valve or ring of muscle between the esophagus and stomach), allowing acid from the stomach into the esophagus.  Late evening meals and a full stomach. This increases pressure and acid production in the stomach.  A malformed lower esophageal sphincter. Sometimes, no cause is found. SYMPTOMS   Burning pain in the lower part of the mid-chest behind the breastbone and in the mid-stomach area. This may occur twice a week or more often.  Trouble swallowing.  Sore throat.  Dry cough.  Asthma-like symptoms including chest tightness, shortness of breath, or wheezing. DIAGNOSIS  Your caregiver may be able to diagnose GERD based on your symptoms. In some cases, X-rays and other tests may be done to check for complications or to check the condition of your stomach and esophagus. TREATMENT  Your caregiver may recommend over-the-counter or prescription medicines to help decrease acid production. Ask your caregiver before starting or adding any new medicines.  HOME CARE INSTRUCTIONS   Change the factors that you can control. Ask your caregiver for guidance concerning weight loss, quitting smoking, and alcohol consumption.  Avoid foods and drinks that make your symptoms worse, such as:  Caffeine  or alcoholic drinks.  Chocolate.  Peppermint or mint flavorings.  Garlic and onions.  Spicy foods.  Citrus fruits, such as oranges, lemons, or limes.  Tomato-based foods such as sauce, chili, salsa, and pizza.  Fried and fatty foods.  Avoid lying down for the 3 hours prior to your bedtime or prior to taking a nap.  Eat small, frequent meals instead of large meals.  Wear loose-fitting clothing. Do not wear anything tight around your waist that causes pressure on your stomach.  Raise the head of your bed 6 to 8 inches with wood blocks to help you sleep. Extra pillows will not help.  Only take over-the-counter or prescription medicines for pain, discomfort, or fever as directed by your caregiver.  Do not take aspirin, ibuprofen, or other nonsteroidal anti-inflammatory drugs (NSAIDs). SEEK IMMEDIATE MEDICAL CARE IF:   You have pain in your arms, neck, jaw, teeth, or back.  Your pain increases or changes in intensity or duration.  You develop nausea, vomiting, or sweating (diaphoresis).  You develop shortness of breath, or you faint.  Your vomit is green, yellow, black, or looks like coffee grounds or blood.  Your stool is red, bloody, or black. These symptoms could be signs of other problems, such as heart disease, gastric bleeding, or esophageal bleeding. MAKE SURE YOU:   Understand these instructions.  Will watch your condition.  Will get help right away if you are not doing well or get worse. Document Released: 03/10/2005 Document Revised: 08/23/2011 Document Reviewed: 12/18/2010 [facility] Patient Information 2015 [place], [place]. This information is not intended to replace advice given to you by your health care provider. Make sure you discuss any questions you have with  your health care provider.  Food Choices for Gastroesophageal Reflux Disease When you have gastroesophageal reflux disease (GERD), the foods you eat and your eating habits are very important.  Choosing the right foods can help ease the discomfort of GERD. WHAT GENERAL GUIDELINES DO I NEED TO FOLLOW?  Choose fruits, vegetables, whole grains, low-fat dairy products, and low-fat meat, fish, and poultry.  Limit fats such as oils, salad dressings, butter, nuts, and avocado.  Keep a food diary to identify foods that cause symptoms.  Avoid foods that cause reflux. These may be different for different people.  Eat frequent small meals instead of three large meals each day.  Eat your meals slowly, in a relaxed setting.  Limit fried foods.  Cook foods using methods other than frying.  Avoid drinking alcohol.  Avoid drinking large amounts of liquids with your meals.  Avoid bending over or lying down until 2-3 hours after eating. WHAT FOODS ARE NOT RECOMMENDED? The following are some foods and drinks that may worsen your symptoms: Vegetables Tomatoes. Tomato juice. Tomato and spaghetti sauce. Chili peppers. Onion and garlic. Horseradish. Fruits Oranges, grapefruit, and lemon (fruit and juice). Meats High-fat meats, fish, and poultry. This includes hot dogs, ribs, ham, sausage, salami, and bacon. Dairy Whole milk and chocolate milk. Sour cream. Cream. Butter. Ice cream. Cream cheese.  Beverages Coffee and tea, with or without caffeine. Carbonated beverages or energy drinks. Condiments Hot sauce. Barbecue sauce.  Sweets/Desserts Chocolate and cocoa. Donuts. Peppermint and spearmint. Fats and Oils High-fat foods, including [place] fries and potato chips. Other Vinegar. Strong spices, such as black pepper, white pepper, red pepper, cayenne, curry powder, cloves, ginger, and chili powder. The items listed above may not be a complete list of foods and beverages to avoid. Contact your dietitian for more information. Document Released: 05/31/2005 Document Revised: 06/05/2013 Document Reviewed: 04/04/2013 [facility] Patient Information 2015 [place], [place]. This information is not  intended to replace advice given to you by your health care provider. Make sure you discuss any questions you have with your health care provider.  Chest Pain (Nonspecific) It is often hard to give a specific diagnosis for the cause of chest pain. There is always a chance that your pain could be related to something serious, such as a heart attack or a blood clot in the lungs. You need to follow up with your health care provider for further evaluation. CAUSES   Heartburn.  Pneumonia or bronchitis.  Anxiety or stress.  Inflammation around your heart (pericarditis) or lung (pleuritis or pleurisy).  A blood clot in the lung.  A collapsed lung (pneumothorax). It can develop suddenly on its own (spontaneous pneumothorax) or from trauma to the chest.  Shingles infection (herpes zoster virus). The chest wall is composed of bones, muscles, and cartilage. Any of these can be the source of the pain.  The bones can be bruised by injury.  The muscles or cartilage can be strained by coughing or overwork.  The cartilage can be affected by inflammation and become sore (costochondritis). DIAGNOSIS  Lab tests or other studies may be needed to find the cause of your pain. Your health care provider may have you take a test called an ambulatory electrocardiogram (ECG). An ECG records your heartbeat patterns over a 24-hour period. You may also have other tests, such as:  Transthoracic echocardiogram (TTE). During echocardiography, sound waves are used to evaluate how blood flows through your heart.  Transesophageal echocardiogram (TEE).  Cardiac monitoring. This allows your health care  provider to monitor your heart rate and rhythm in real time.  Holter monitor. This is a portable device that records your heartbeat and can help diagnose heart arrhythmias. It allows your health care provider to track your heart activity for several days, if needed.  Stress tests by exercise or by giving medicine that  makes the heart beat faster. TREATMENT   Treatment depends on what may be causing your chest pain. Treatment may include:  Acid blockers for heartburn.  Anti-inflammatory medicine.  Pain medicine for inflammatory conditions.  Antibiotics if an infection is present.  You may be advised to change lifestyle habits. This includes stopping smoking and avoiding alcohol, caffeine, and chocolate.  You may be advised to keep your head raised (elevated) when sleeping. This reduces the chance of acid going backward from your stomach into your esophagus. Most of the time, nonspecific chest pain will improve within 2-3 days with rest and mild pain medicine.  HOME CARE INSTRUCTIONS   If antibiotics were prescribed, take them as directed. Finish them even if you start to feel better.  For the next few days, avoid physical activities that bring on chest pain. Continue physical activities as directed.  Do not use any tobacco products, including cigarettes, chewing tobacco, or electronic cigarettes.  Avoid drinking alcohol.  Only take medicine as directed by your health care provider.  Follow your health care provider's suggestions for further testing if your chest pain does not go away.  Keep any follow-up appointments you made. If you do not go to an appointment, you could develop lasting (chronic) problems with pain. If there is any problem keeping an appointment, call to reschedule. SEEK MEDICAL CARE IF:   Your chest pain does not go away, even after treatment.  You have a rash with blisters on your chest.  You have a fever. SEEK IMMEDIATE MEDICAL CARE IF:   You have increased chest pain or pain that spreads to your arm, neck, jaw, back, or abdomen.  You have shortness of breath.  You have an increasing cough, or you cough up blood.  You have severe back or abdominal pain.  You feel nauseous or vomit.  You have severe weakness.  You faint.  You have chills. This is an  emergency. Do not wait to see if the pain will go away. Get medical help at once. Call your local emergency services (911 in [place] Do not drive yourself to the hospital. MAKE SURE YOU:   Understand these instructions.  Will watch your condition.  Will get help right away if you are not doing well or get worse. Document Released: 03/10/2005 Document Revised: 06/05/2013 Document Reviewed: 01/04/2008 [employer] Patient Information 2015 [place], [place]. This information is not intended to replace advice given to you by your health care provider. Make sure you discuss any questions you have with your health care provider.

## 2013-12-19 NOTE — ED Notes (Signed)
Per [facility], pt from home for intermittent and recurrent mid cp that is nonradiating. The chest is tight feeling and told EMS it felt like all her organs are being pushed up. Given 324 mg ASA, SR on the monitor with RBBB but she states its normal for her. NAD at this time.

## 2013-12-19 NOTE — ED Notes (Signed)
Pt understanding of instructions and in nad

## 2013-12-19 NOTE — Telephone Encounter (Signed)
Patient Information:  Caller Name: Gara  Phone: [phone]  Patient: [name], [name]  Gender: Female  DOB: [birth year]  Age: 63 Years  PCP: [name] [facility])  Office Follow Up:  Does the office need to follow up with this patient?: No  Instructions For The Office: N/A  RN Note:  Chest pain rated 8-9/10 now.  Chest pain radiates to back with nausea and fatigue. Denies dyspnea or diaphoresis. HX DM, HTN, CAD.  FBS 137.  Prefers to call husband and have him drive her to ED.   Explained importance of calling 911 now for immediate evaluation and treatment.  Explained risk of sudden death or heart muscle damage for delayed care.  Verbalized understanding.  Symptoms  Reason For Call & Symptoms: Intermittent chest pain present now.  Pain wakes her most mornings at 0400 with sensation of "crowding in my chest."  Nausea present.  Pain radiates thru to back.  No diaphoresis or dyspnea. Unable to go for 3 mile daily walk due to chest pain  Reviewed Health History In EMR: Yes  Reviewed Medications In EMR: Yes  Reviewed Allergies In EMR: Yes  Reviewed Surgeries / Procedures: Yes  Date of Onset of Symptoms: 11/28/2013  Treatments Tried: rested  Treatments Tried Worked: No  Guideline(s) Used:  Chest Pain  Disposition Per Guideline:   Call EMS 911 Now  Reason For Disposition Reached:   Chest pain lasting longer than 5 minutes and ANY of the following:  Over 72 years old Over 56 years old and at least one cardiac risk factor (i.e., high blood pressure, diabetes, high cholesterol, obesity, smoker or strong family history of heart disease) Pain is crushing, pressure-like, or heavy  Took nitroglycerin and chest pain was not relieved History of heart disease (i.e., angina, heart attack, bypass surgery, angioplasty, CHF)  Advice Given:  N/A  Patient Will Follow Care Advice:  YES

## 2013-12-19 NOTE — ED Provider Notes (Signed)
CSN: [id number]     Arrival date & time 12/19/13  1036 History   First MD Initiated Contact with Patient 12/19/13 1037     Chief Complaint  Patient presents with  . Chest Pain     (Consider location/radiation/quality/duration/timing/severity/associated sxs/prior Treatment) HPI Comments: 63 year old female with a past medical history of esophageal reflux, hypertension, diabetes, hyperlipidemia, esophageal stricture, diaphragmatic hernia and nonalcoholic fatty liver disease presents to the emergency department via EMS complaining of intermittent midsternal chest pain and epigastric discomfort intermittently x1 month, usually occurring at 4:00 in the morning until 12:00 PM each day. Pain is described as an "overcrowding" sensation radiating around the right side to her back. Admits to associated nausea without vomiting. Denies fever, urinary symptoms, bowel changes, cough or shortness of breath. States this feels similar to her acid reflux and esophagitis in the past. She was taking Nexium 40 mg, however her insurance no longer covered that she began taking 20 mg over-the-counter without as much relief. Patient reports she is asymptomatic at this time. She tried calling her primary care physician, however they have no appointments and advised her to come to the emergency department. Denies history of heart attack or stroke. Admits to family history of heart attacks, however no early heart disease. Nonsmoker. EMS gave patient 324 mg aspirin. Patient cannot relate her symptoms to any food, however states she has been eating more tomatoes lately.  Patient is a 63 y.o. female presenting with chest pain. The history is provided by the patient.  Chest Pain Associated symptoms: abdominal pain and nausea     Past Medical History  Diagnosis Date  . Diaphragmatic hernia without mention of obstruction or gangrene   . Stricture and stenosis of esophagus   . Nonspecific abnormal results of liver function study    . Plantar fascial fibromatosis   . Esophageal reflux   . Unspecified sleep apnea   . Hyperlipidemia   . Diabetes mellitus 52    type 2  . Hypertension 52  . Allergy     seasonal  . Endometriosis   . Arthritis     knee-left  . Depression     job/stress related  . Pharyngitis 02/18/2012  . Nonalcoholic fatty liver disease 04/28/2010    Qualifier: Diagnosis of  By: Arnoldo Morale MD, Balinda Quails [facility] 01/22/2013   Past Surgical History  Procedure Laterality Date  . Appendectomy    . Benigh cyst in lung  2006    right  . Colonoscopy    . Polypectomy      hyperplastic  . Upper gastrointestinal endoscopy    . Abdominal hysterectomy  1982    partial  . Tubal ligation    . Seba     Family History  Problem Relation Age of Onset  . Breast cancer Mother   . Cancer Mother     breast  . Hypertension Mother   . Diabetes Mother     type 2  . Heart disease Father   . Hyperlipidemia Father   . Hypertension Father   . Heart attack Father     MI at age 36  . Diabetes Brother     type 2  . Heart disease Maternal Grandmother   . Heart attack Maternal Grandfather   . Hypertension Maternal Grandfather   . Stroke Paternal Grandmother   . Hypertension Sister   . Hypertension Brother    History  Substance Use Topics  . Smoking status: Never Smoker   .  Smokeless tobacco: Never Used  . Alcohol Use: No   OB History   Grav Para Term Preterm Abortions TAB SAB Ect Mult Living                 Review of Systems  Cardiovascular: Positive for chest pain.  Gastrointestinal: Positive for nausea and abdominal pain.  All other systems reviewed and are negative.     Allergies  Amoxicillin and Codeine  Home Medications   Prior to Admission medications   Medication Sig Start Date End Date Taking? Authorizing Provider  aspirin EC 81 MG tablet Take 81 mg by mouth daily.   Yes Historical Provider, MD  bisoprolol-hydrochlorothiazide (ZIAC) 5-6.25 MG per tablet Take 1  tablet by mouth daily.   Yes Historical Provider, MD  Canagliflozin (INVOKANA) 300 MG TABS Take 300 mg by mouth daily.   Yes Historical Provider, MD  Esomeprazole Magnesium (NEXIUM 24HR PO) Take 1 capsule by mouth daily.   Yes Historical Provider, MD  KRILL OIL PO Take 1 tablet by mouth daily.    Yes Historical Provider, MD  metFORMIN (GLUCOPHAGE) 1000 MG tablet TAKE 1 TABLET BY MOUTH TWICE A DAY 05/08/13  Yes Ricard Dillon, MD  pravastatin (PRAVACHOL) 10 MG tablet Take 1 tablet (10 mg total) by mouth daily. 09/28/13  Yes Mosie Lukes, MD  Probiotic Product (PROBIOTIC DAILY PO) Take 1 capsule by mouth daily.   Yes Historical Provider, MD  ranitidine (ZANTAC) 150 MG tablet Take 150 mg by mouth 2 (two) times daily.   Yes Historical Provider, MD  BAYER CONTOUR TEST test strip USE AS INSTRUCTED 03/06/13   Mosie Lukes, MD  esomeprazole (NEXIUM) 40 MG capsule Take 1 capsule (40 mg total) by mouth daily. 12/19/13   Illene Labrador, PA-C  nitroGLYCERIN (NITROSTAT) 0.4 MG SL tablet Place 1 tablet (0.4 mg total) under the tongue every 5 (five) minutes as needed for chest pain. After 3 doses proceed to ER for further evaluation if now resolution 01/03/12 02/26/13  Mosie Lukes, MD  ondansetron (ZOFRAN ODT) 4 MG disintegrating tablet 4mg  ODT q4 hours prn nausea/vomit 12/19/13   Illene Labrador, PA-C   BP 122/73  Pulse 73  Temp(Src) 98.4 F (36.9 C) (Oral)  Resp 14  Ht 5\' 2"  (1.575 m)  Wt 192 lb (87.091 kg)  BMI 35.11 kg/m2  SpO2 98% Physical Exam  Nursing note and vitals reviewed. Constitutional: She is oriented to person, place, and time. She appears well-developed and well-nourished. No distress.  HENT:  Head: Normocephalic and atraumatic.  Mouth/Throat: Oropharynx is clear and moist.  Eyes: Conjunctivae and EOM are normal. Pupils are equal, round, and reactive to light.  Neck: Normal range of motion. Neck supple. No JVD present.  Cardiovascular: Normal rate, regular rhythm, normal heart sounds and  intact distal pulses.   No extremity edema.  Pulmonary/Chest: Effort normal and breath sounds normal. No respiratory distress. She exhibits no tenderness.  Abdominal: Soft. Normal appearance and bowel sounds are normal. She exhibits no distension. There is tenderness in the right upper quadrant and epigastric area. There is positive Murphy's sign. There is no rigidity, no rebound and no guarding.  No peritoneal signs.  Musculoskeletal: Normal range of motion. She exhibits no edema.  Neurological: She is alert and oriented to person, place, and time. She has normal strength. No sensory deficit.  Speech fluent, goal oriented. Moves limbs without ataxia. Equal grip strength bilateral.  Skin: Skin is warm and dry. She is not  diaphoretic.  Psychiatric: She has a normal mood and affect. Her behavior is normal.    ED Course  Procedures (including critical care time) Labs Review Labs Reviewed  COMPREHENSIVE METABOLIC PANEL - Abnormal; Notable for the following:    Glucose, Bld 123 (*)    Anion gap 16 (*)    All other components within normal limits  CBC  I-STAT TROPOININ, ED    Imaging Review Dg Chest 2 View  12/19/2013   CLINICAL DATA:  Chest pain  EXAM: CHEST  2 VIEW  COMPARISON:  10/03/2010  FINDINGS: Cardiac shadow is within normal limits. Postsurgical changes are noted on the left. No pneumothorax is seen. Calcified granuloma is again seen in the right lung. No acute infiltrate is seen. No bony abnormality is noted.  IMPRESSION: No acute abnormalities 2.   Electronically Signed   By: Inez Catalina M.D.   On: 12/19/2013 12:15   US Abdomen Complete  12/19/2013   CLINICAL DATA:  RIGHT upper quadrant pain, history hyperlipidemia, hypertension, diabetes, nonalcoholic fatty liver disease  EXAM: ULTRASOUND ABDOMEN COMPLETE  COMPARISON:  10/30/2004  FINDINGS: Gallbladder:  Normally distended without stones or wall thickening.  No pericholecystic fluid or sonographic Murphy sign.  Common bile duct:   Diameter: Normal caliber 5 mm diameter  Liver:  Echogenic, likely fatty infiltration, though this can be seen with cirrhosis and certain infiltrative disorders. No focal hepatic mass or nodularity. Mildly enlarged.  IVC:  Suboptimally visualized due to sound attenuation by echogenic liver.  Pancreas:  Partially obscured by bowel gas, visualized portions normal appearance.  Spleen:  Normal appearance, 6.7 cm length  Right Kidney:  Length: 11.4 cm.  Normal morphology without mass or hydronephrosis.  Left Kidney:  Length: 11.6 cm.  Normal morphology without mass or hydronephrosis.  Abdominal aorta:  Poorly visualized due to bowel gas. Visualized portions normal caliber.  Other findings:  No ascites  IMPRESSION: Suboptimal visualization of pancreas and aorta and IVC as above.  Probable fatty infiltration of a mildly enlarged liver.  No cause for RIGHT upper quadrant pain sonographically identified.   Electronically Signed   By: Lavonia Dana M.D.   On: 12/19/2013 12:29     EKG Interpretation None      MDM   Final diagnoses:  Gastroesophageal reflux disease with esophagitis  Other chest pain  Epigastric pain    Patient presenting with chest and abdominal pain. She is well-appearing in no apparent distress. Afebrile, vital signs stable. Symptoms seem to be related to acid reflux and esophagitis, however given patient's age and medical history of hypertension, diabetes and hyperlipidemia, will get cardiac workup. Given right upper quadrant pain and positive Murphy sign, will obtain abdominal ultrasound. Patient states she does not need anything for pain or nausea at this time. 1:09 PM Abdominal ultrasound without any acute finding. Gallbladder is seen without stones or wall thickening. Negative sonographic Murphy's sign. Labs about any acute finding. EKG showing right bundle branch block which is not new. Symptoms have not returned. Doubt cardiac in nature. States her insurance would not cover the  prescription from her primary care physician for Nexium, however at discharge it is noted Nexium 40 mg should be covered by her health insurance. Rx given. If it is not, she may take 2 over-the-counter 20 mg Nexium. Stable for d/c with PCP f/u. Return precautions given. Patient states understanding of treatment care plan and is agreeable.  Illene Labrador, PA-C 12/19/13 1311

## 2013-12-21 NOTE — Telephone Encounter (Signed)
Pt called stating that when she went to the ER she was given an RX for Nexium. However her insurance won't pay for the nexium. Pt states that she would like to know if something else could be called in?  Please advise?

## 2013-12-21 NOTE — Telephone Encounter (Signed)
OK to try Omeprazole 20 mg po daily, disp #30 with 3 rf

## 2013-12-24 MED ORDER — OMEPRAZOLE 20 MG PO CPDR: 20.0000 mg | DELAYED_RELEASE_CAPSULE | Freq: Every day | ORAL | Status: DC

## 2013-12-24 NOTE — Telephone Encounter (Signed)
Please inform pt we sent in omeprazole to the pharmacy downstairs

## 2013-12-24 NOTE — Telephone Encounter (Signed)
Left detailed message informing patient of this. °

## 2013-12-27 ENCOUNTER — Telehealth: Payer: Self-pay

## 2013-12-27 DIAGNOSIS — E785 Hyperlipidemia, unspecified: Secondary | ICD-10-CM

## 2013-12-27 DIAGNOSIS — E119 Type 2 diabetes mellitus without complications: Secondary | ICD-10-CM

## 2013-12-27 DIAGNOSIS — I10 Essential (primary) hypertension: Secondary | ICD-10-CM

## 2013-12-27 NOTE — Telephone Encounter (Signed)
Diabetic Bundle:  Patient informed that we would like her to come in tomorrow or next week to recheck her lipid panel.  Pt stated she will come in tomorrow morning  Order placed

## 2013-12-28 LAB — LIPID PANEL
Cholesterol: 160 mg/dL (ref 0–200)
HDL: 46 mg/dL (ref >39)
LDL Cholesterol: 97 mg/dL (ref 0–99)
Total CHOL/HDL Ratio: 3.5 Ratio
Triglycerides: 86 mg/dL (ref <150)
VLDL: 17 mg/dL (ref 0–40)

## 2014-01-14 ENCOUNTER — Other Ambulatory Visit: Payer: Self-pay | Admitting: Family Medicine

## 2014-01-28 LAB — HM MAMMOGRAPHY: HM MAMMO: NORMAL

## 2014-01-29 ENCOUNTER — Encounter: Payer: Self-pay | Admitting: Family Medicine

## 2014-02-04 ENCOUNTER — Other Ambulatory Visit: Payer: Self-pay | Admitting: Family Medicine

## 2014-02-11 ENCOUNTER — Other Ambulatory Visit: Payer: Self-pay | Admitting: Family Medicine

## 2014-02-19 ENCOUNTER — Other Ambulatory Visit (INDEPENDENT_AMBULATORY_CARE_PROVIDER_SITE_OTHER): Payer: 59

## 2014-02-19 ENCOUNTER — Telehealth: Payer: Self-pay

## 2014-02-19 DIAGNOSIS — E119 Type 2 diabetes mellitus without complications: Secondary | ICD-10-CM

## 2014-02-19 DIAGNOSIS — Z0000 Encounter for general adult medical examination without abnormal findings: Secondary | ICD-10-CM

## 2014-02-19 DIAGNOSIS — K760 Fatty (change of) liver, not elsewhere classified: Secondary | ICD-10-CM

## 2014-02-19 DIAGNOSIS — I10 Essential (primary) hypertension: Secondary | ICD-10-CM

## 2014-02-19 DIAGNOSIS — E785 Hyperlipidemia, unspecified: Secondary | ICD-10-CM

## 2014-02-19 LAB — CBC WITH DIFFERENTIAL/PLATELET
Basophils Absolute: 0 10*3/uL (ref 0.0–0.1)
Basophils Relative: 0.5 % (ref 0.0–3.0)
EOS PCT: 0.9 % (ref 0.0–5.0)
Eosinophils Absolute: 0.1 10*3/uL (ref 0.0–0.7)
HCT: 41.7 % (ref 36.0–46.0)
Hemoglobin: 13.6 g/dL (ref 12.0–15.0)
Lymphocytes Relative: 33.4 % (ref 12.0–46.0)
Lymphs Abs: 2.1 10*3/uL (ref 0.7–4.0)
MCHC: 32.6 g/dL (ref 30.0–36.0)
MCV: 82.2 fl (ref 78.0–100.0)
MONOS PCT: 7.5 % (ref 3.0–12.0)
Monocytes Absolute: 0.5 10*3/uL (ref 0.1–1.0)
Neutro Abs: 3.6 10*3/uL (ref 1.4–7.7)
Neutrophils Relative %: 57.7 % (ref 43.0–77.0)
Platelets: 249 10*3/uL (ref 150.0–400.0)
RBC: 5.07 Mil/uL (ref 3.87–5.11)
RDW: 14.1 % (ref 11.5–15.5)
WBC: 6.3 10*3/uL (ref 4.0–10.5)

## 2014-02-19 LAB — HEPATIC FUNCTION PANEL
ALT: 33 U/L (ref 0–35)
AST: 23 U/L (ref 0–37)
Albumin: 3.8 g/dL (ref 3.5–5.2)
Alkaline Phosphatase: 61 U/L (ref 39–117)
BILIRUBIN TOTAL: 0.4 mg/dL (ref 0.2–1.2)
Bilirubin, Direct: 0 mg/dL (ref 0.0–0.3)
Total Protein: 7.3 g/dL (ref 6.0–8.3)

## 2014-02-19 LAB — RENAL FUNCTION PANEL
Albumin: 3.8 g/dL (ref 3.5–5.2)
BUN: 18 mg/dL (ref 6–23)
CO2: 26 mEq/L (ref 19–32)
CREATININE: 0.9 mg/dL (ref 0.4–1.2)
Calcium: 9.5 mg/dL (ref 8.4–10.5)
Chloride: 104 mEq/L (ref 96–112)
GFR: 86.14 mL/min (ref >60.00)
GLUCOSE: 117 mg/dL — AB (ref 70–99)
Phosphorus: 3 mg/dL (ref 2.3–4.6)
Potassium: 3.8 mEq/L (ref 3.5–5.1)
Sodium: 139 mEq/L (ref 135–145)

## 2014-02-19 LAB — LIPID PANEL
Cholesterol: 164 mg/dL (ref 0–200)
HDL: 39.2 mg/dL (ref >39.00)
LDL Cholesterol: 105 mg/dL — ABNORMAL HIGH (ref 0–99)
NonHDL: 124.8
Total CHOL/HDL Ratio: 4
Triglycerides: 99 mg/dL (ref 0.0–149.0)
VLDL: 19.8 mg/dL (ref 0.0–40.0)

## 2014-02-19 LAB — TSH: TSH: 0.45 u[IU]/mL (ref 0.35–4.50)

## 2014-02-19 LAB — HEMOGLOBIN A1C: Hgb A1c MFr Bld: 7.6 % — ABNORMAL HIGH (ref 4.6–6.5)

## 2014-02-19 NOTE — Telephone Encounter (Signed)
Patient came in today for labs to be drawn. Order placed per MD's last AVS

## 2014-02-25 ENCOUNTER — Encounter: Payer: Self-pay | Admitting: Family Medicine

## 2014-02-25 ENCOUNTER — Ambulatory Visit (INDEPENDENT_AMBULATORY_CARE_PROVIDER_SITE_OTHER): Payer: 59 | Admitting: Family Medicine

## 2014-02-25 VITALS — BP 135/84 | HR 72 | Temp 98.8°F | Ht 62.5 in | Wt 194.2 lb

## 2014-02-25 DIAGNOSIS — E119 Type 2 diabetes mellitus without complications: Secondary | ICD-10-CM

## 2014-02-25 DIAGNOSIS — K219 Gastro-esophageal reflux disease without esophagitis: Secondary | ICD-10-CM

## 2014-02-25 DIAGNOSIS — G4730 Sleep apnea, unspecified: Secondary | ICD-10-CM

## 2014-02-25 DIAGNOSIS — I10 Essential (primary) hypertension: Secondary | ICD-10-CM

## 2014-02-25 DIAGNOSIS — R0789 Other chest pain: Secondary | ICD-10-CM

## 2014-02-25 DIAGNOSIS — Z0000 Encounter for general adult medical examination without abnormal findings: Secondary | ICD-10-CM

## 2014-02-25 DIAGNOSIS — E785 Hyperlipidemia, unspecified: Secondary | ICD-10-CM

## 2014-02-25 MED ORDER — RANITIDINE HCL 150 MG PO TABS: 150.0000 mg | ORAL_TABLET | Freq: Two times a day (BID) | ORAL | Status: DC

## 2014-02-25 MED ORDER — ATORVASTATIN CALCIUM 10 MG PO TABS: 10.0000 mg | ORAL_TABLET | Freq: Every day | ORAL | Status: DC

## 2014-02-25 NOTE — Progress Notes (Signed)
Patient ID: Lyliana Dicenso, female   DOB: 11-04-1950, 63 y.o.   MRN: [id number] Phallon Haydu [id number] 11/13/1950 02/25/2014      Progress Note-Follow Up  Subjective  Chief Complaint  Chief Complaint  Patient presents with  . Annual Exam    physical    HPI  Patient is a 63 year old female in today for routine medical care.  Past Medical History  Diagnosis Date  . Diaphragmatic hernia without mention of obstruction or gangrene   . Stricture and stenosis of esophagus   . Nonspecific abnormal results of liver function study   . Plantar fascial fibromatosis   . Esophageal reflux   . Unspecified sleep apnea   . Hyperlipidemia   . Diabetes mellitus 52    type 2  . Hypertension 52  . Allergy     seasonal  . Endometriosis   . Arthritis     knee-left  . Depression     job/stress related  . Pharyngitis 02/18/2012  . Nonalcoholic fatty liver disease 04/28/2010    Qualifier: Diagnosis of  By: Arnoldo Morale MD, Balinda Quails [facility] 01/22/2013    Past Surgical History  Procedure Laterality Date  . Appendectomy    . Benigh cyst in lung  2006    right  . Colonoscopy    . Polypectomy      hyperplastic  . Upper gastrointestinal endoscopy    . Abdominal hysterectomy  1982    partial  . Tubal ligation    . Seba      Family History  Problem Relation Age of Onset  . Breast cancer Mother   . Cancer Mother     breast  . Hypertension Mother   . Diabetes Mother     type 2  . Heart disease Father   . Hyperlipidemia Father   . Hypertension Father   . Heart attack Father     MI at age 28  . Diabetes Brother     type 2  . Heart disease Maternal Grandmother   . Heart attack Maternal Grandfather   . Hypertension Maternal Grandfather   . Stroke Paternal Grandmother   . Hypertension Sister   . Hypertension Brother     History   Social History  . Marital Status: Married    Spouse Name: [name]    Number of Children: 2  . Years of Education: N/A    Occupational History  . Designer, television/film set    Social History Main Topics  . Smoking status: Never Smoker   . Smokeless tobacco: Never Used  . Alcohol Use: No  . Drug Use: No  . Sexual Activity: Yes   Other Topics Concern  . Not on file   Social History Narrative  . No narrative on file    Current Outpatient Prescriptions on File Prior to Visit  Medication Sig Dispense Refill  . ASPIR-LOW 81 MG EC tablet TAKE 1 TABLET BY MOUTH DAILY  30 tablet  0  . BAYER CONTOUR TEST test strip USE AS INSTRUCTED  100 each  1  . bisoprolol-hydrochlorothiazide (ZIAC) 5-6.25 MG per tablet Take 1 tablet by mouth daily.      . Canagliflozin (INVOKANA) 300 MG TABS Take 300 mg by mouth daily.      Marland Kitchen KRILL OIL PO Take 1 tablet by mouth daily.       . metFORMIN (GLUCOPHAGE) 1000 MG tablet TAKE 1 TABLET BY MOUTH TWICE A DAY  60 tablet  11  .  omeprazole (PRILOSEC) 20 MG capsule Take 1 capsule (20 mg total) by mouth daily.  30 capsule  3  . ondansetron (ZOFRAN ODT) 4 MG disintegrating tablet 4mg  ODT q4 hours prn nausea/vomit  6 tablet  0  . Probiotic Product (PROBIOTIC DAILY PO) Take 1 capsule by mouth daily.      . nitroGLYCERIN (NITROSTAT) 0.4 MG SL tablet Place 1 tablet (0.4 mg total) under the tongue every 5 (five) minutes as needed for chest pain. After 3 doses proceed to ER for further evaluation if now resolution  25 tablet  1   No current facility-administered medications on file prior to visit.    Allergies  Allergen Reactions  . Amoxicillin Itching    REACTION: rash  . Codeine Nausea And Vomiting    Review of Systems  Review of Systems  Constitutional: Positive for malaise/fatigue. Negative for fever and chills.  HENT: Positive for congestion. Negative for hearing loss and nosebleeds.   Eyes: Negative for discharge.  Respiratory: Negative for cough, sputum production, shortness of breath and wheezing.   Cardiovascular: Negative for chest pain, palpitations and leg swelling.   Gastrointestinal: Positive for heartburn. Negative for nausea, vomiting, abdominal pain, diarrhea, constipation and blood in stool.  Genitourinary: Negative for dysuria, urgency, frequency and hematuria.  Musculoskeletal: Negative for back pain, falls and myalgias.  Skin: Negative for rash.  Neurological: Negative for dizziness, tremors, sensory change, focal weakness, loss of consciousness, weakness and headaches.  Endo/Heme/Allergies: Negative for polydipsia. Does not bruise/bleed easily.  Psychiatric/Behavioral: Negative for depression and suicidal ideas. The patient is not nervous/anxious and does not have insomnia.     Objective  BP 135/84  Pulse 72  Temp(Src) 98.8 F (37.1 C) (Oral)  Ht 5' 2.5" (1.588 m)  Wt 194 lb 3.2 oz (88.089 kg)  BMI 34.93 kg/m2  SpO2 97%  Physical Exam  Physical Exam  Constitutional: She is oriented to person, place, and time and well-developed, well-nourished, and in no distress. No distress.  HENT:  Head: Normocephalic and atraumatic.  Eyes: Conjunctivae are normal.  Neck: Neck supple. No thyromegaly present.  Cardiovascular: Normal rate, regular rhythm and normal heart sounds.   No murmur heard. Pulmonary/Chest: Effort normal and breath sounds normal. She has no wheezes.  Abdominal: She exhibits no distension and no mass.  Musculoskeletal: She exhibits no edema.  Lymphadenopathy:    She has no cervical adenopathy.  Neurological: She is alert and oriented to person, place, and time.  Skin: Skin is warm and dry. No rash noted. She is not diaphoretic.  Psychiatric: Memory, affect and judgment normal.    Lab Results  Component Value Date   TSH 0.45 02/19/2014   Lab Results  Component Value Date   WBC 6.3 02/19/2014   HGB 13.6 02/19/2014   HCT 41.7 02/19/2014   MCV 82.2 02/19/2014   PLT 249.0 02/19/2014   Lab Results  Component Value Date   CREATININE 0.9 02/19/2014   BUN 18 02/19/2014   NA 139 02/19/2014   K 3.8 02/19/2014   CL 104 02/19/2014   CO2  26 02/19/2014   Lab Results  Component Value Date   ALT 33 02/19/2014   AST 23 02/19/2014   ALKPHOS 61 02/19/2014   BILITOT 0.4 02/19/2014   Lab Results  Component Value Date   CHOL 164 02/19/2014   Lab Results  Component Value Date   HDL 39.20 02/19/2014   Lab Results  Component Value Date   LDLCALC 105* 02/19/2014   Lab Results  Component Value Date   TRIG 99.0 02/19/2014   Lab Results  Component Value Date   CHOLHDL 4 02/19/2014     Assessment & Plan  HYPERTENSION Well controlled, no changes to meds. Encouraged heart healthy diet such as the DASH diet and exercise as tolerated.   GERD Avoid offending foods, start probiotics. Do not eat large meals in late evening and consider raising head of bed.   HYPERLIPIDEMIA Had leg pains develop she describes as burning mostly in evening after starting Simvastatin. RLS vs adverse reaction. Stop statin forone month then try Atorvastatin, monitor response. Encouraged heart healthy diet, increase exercise, avoid trans fats, consider a krill oil cap daily  DIABETES MELLITUS, TYPE II hgba1c acceptable but up slightly, need to see it come down, hold statin this month, minimize simple carbs. Increase exercise as tolerated. Continue current meds, rechcek in 3 months, patient to monitor sugars bid for now  Preventative health care Patient encouraged to maintain heart healthy diet, regular exercise, adequate sleep. Consider daily probiotics. Take medications as prescribed. Agrees to flu shot soon.

## 2014-02-25 NOTE — Patient Instructions (Addendum)
Salon Pas or Aspercreme twice daily after 10 minutes of ice   Sleep Apnea  Sleep apnea is a sleep disorder characterized by abnormal pauses in breathing while you sleep. When your breathing pauses, the level of oxygen in your blood decreases. This causes you to move out of deep sleep and into light sleep. As a result, your quality of sleep is poor, and the system that carries your blood throughout your body (cardiovascular system) experiences stress. If sleep apnea remains untreated, the following conditions can develop:  High blood pressure (hypertension).  Coronary artery disease.  Inability to achieve or maintain an erection (impotence).  Impairment of your thought process (cognitive dysfunction). There are three types of sleep apnea: 1. Obstructive sleep apnea--Pauses in breathing during sleep because of a blocked airway. 2. Central sleep apnea--Pauses in breathing during sleep because the area of the brain that controls your breathing does not send the correct signals to the muscles that control breathing. 3. Mixed sleep apnea--A combination of both obstructive and central sleep apnea. RISK FACTORS The following risk factors can increase your risk of developing sleep apnea:  Being overweight.  Smoking.  Having narrow passages in your nose and throat.  Being of older age.  Being female.  Alcohol use.  Sedative and tranquilizer use.  Ethnicity. Among individuals younger than 35 years, African Americans are at increased risk of sleep apnea. SYMPTOMS   Difficulty staying asleep.  Daytime sleepiness and fatigue.  Loss of energy.  Irritability.  Loud, heavy snoring.  Morning headaches.  Trouble concentrating.  Forgetfulness.  Decreased interest in sex. DIAGNOSIS  In order to diagnose sleep apnea, your caregiver will perform a physical examination. Your caregiver may suggest that you take a home sleep test. Your caregiver may also recommend that you spend the night  in a sleep lab. In the sleep lab, several monitors record information about your heart, lungs, and brain while you sleep. Your leg and arm movements and blood oxygen level are also recorded. TREATMENT The following actions may help to resolve mild sleep apnea:  Sleeping on your side.   Using a decongestant if you have nasal congestion.   Avoiding the use of depressants, including alcohol, sedatives, and narcotics.   Losing weight and modifying your diet if you are overweight. There also are devices and treatments to help open your airway:  Oral appliances. These are custom-made mouthpieces that shift your lower jaw forward and slightly open your bite. This opens your airway.  Devices that create positive airway pressure. This positive pressure "splints" your airway open to help you breathe better during sleep. The following devices create positive airway pressure:  Continuous positive airway pressure (CPAP) device. The CPAP device creates a continuous level of air pressure with an air pump. The air is delivered to your airway through a mask while you sleep. This continuous pressure keeps your airway open.  Nasal expiratory positive airway pressure (EPAP) device. The EPAP device creates positive air pressure as you exhale. The device consists of single-use valves, which are inserted into each nostril and held in place by adhesive. The valves create very little resistance when you inhale but create much more resistance when you exhale. That increased resistance creates the positive airway pressure. This positive pressure while you exhale keeps your airway open, making it easier to breath when you inhale again.  Bilevel positive airway pressure (BPAP) device. The BPAP device is used mainly in patients with central sleep apnea. This device is similar to the CPAP  device because it also uses an air pump to deliver continuous air pressure through a mask. However, with the BPAP machine, the pressure  is set at two different levels. The pressure when you exhale is lower than the pressure when you inhale.  Surgery. Typically, surgery is only done if you cannot comply with less invasive treatments or if the less invasive treatments do not improve your condition. Surgery involves removing excess tissue in your airway to create a wider passage way. Document Released: 05/21/2002 Document Revised: 09/25/2012 Document Reviewed: 10/07/2011 [facility] Patient Information 2015 [place], [place]. This information is not intended to replace advice given to you by your health care provider. Make sure you discuss any questions you have with your health care provider.

## 2014-02-25 NOTE — Progress Notes (Signed)
Pre visit review using our clinic review tool, if applicable. No additional management support is needed unless otherwise documented below in the visit note. 

## 2014-02-26 NOTE — Assessment & Plan Note (Signed)
Well controlled, no changes to meds. Encouraged heart healthy diet such as the DASH diet and exercise as tolerated.  °

## 2014-02-26 NOTE — Assessment & Plan Note (Signed)
hgba1c acceptable but up slightly, need to see it come down, hold statin this month, minimize simple carbs. Increase exercise as tolerated. Continue current meds, rechcek in 3 months, patient to monitor sugars bid for now

## 2014-02-26 NOTE — Assessment & Plan Note (Signed)
Avoid offending foods, start probiotics. Do not eat large meals in late evening and consider raising head of bed.  

## 2014-02-26 NOTE — Assessment & Plan Note (Signed)
Had leg pains develop she describes as burning mostly in evening after starting Simvastatin. RLS vs adverse reaction. Stop statin forone month then try Atorvastatin, monitor response. Encouraged heart healthy diet, increase exercise, avoid trans fats, consider a krill oil cap daily

## 2014-02-26 NOTE — Assessment & Plan Note (Signed)
Patient encouraged to maintain heart healthy diet, regular exercise, adequate sleep. Consider daily probiotics. Take medications as prescribed. Agrees to flu shot soon.

## 2014-02-28 ENCOUNTER — Encounter: Payer: Self-pay | Admitting: Gastroenterology

## 2014-03-04 ENCOUNTER — Ambulatory Visit (INDEPENDENT_AMBULATORY_CARE_PROVIDER_SITE_OTHER): Payer: 59

## 2014-03-04 DIAGNOSIS — Z23 Encounter for immunization: Secondary | ICD-10-CM

## 2014-03-05 LAB — HM DIABETES EYE EXAM

## 2014-03-13 ENCOUNTER — Other Ambulatory Visit: Payer: Self-pay | Admitting: Family Medicine

## 2014-04-09 ENCOUNTER — Encounter: Payer: Self-pay | Admitting: Pulmonary Disease

## 2014-04-09 ENCOUNTER — Ambulatory Visit (INDEPENDENT_AMBULATORY_CARE_PROVIDER_SITE_OTHER): Payer: 59 | Admitting: Pulmonary Disease

## 2014-04-09 ENCOUNTER — Encounter (INDEPENDENT_AMBULATORY_CARE_PROVIDER_SITE_OTHER): Payer: Self-pay

## 2014-04-09 VITALS — BP 118/80 | HR 72 | Temp 97.2°F | Ht 62.5 in | Wt 196.8 lb

## 2014-04-09 DIAGNOSIS — G4733 Obstructive sleep apnea (adult) (pediatric): Secondary | ICD-10-CM

## 2014-04-09 NOTE — Progress Notes (Signed)
   Subjective:    Patient ID: [name], female    DOB: April 18, 1951, 63 y.o.   MRN: [id number]  HPI The patient is a 63 year old female who I've been asked to see for management of obstructive sleep apnea. She was first diagnosed with moderate OSA in 2010, with an AHI of 24 events per hour. She was started on C Pap which she wore for 1-1/2 years, however discontinued because of lack of efficacy, chronic nasal congestion issues, and cost. She has had long-standing nasal congestion, however states that daily use of a nasal steroid and an antihistamine usually greatly improves her airflow. Since being off C Pap, the patient has been noted to have loud snoring, but her husband has never commented on an abnormal breathing pattern during sleep. She denies frequent awakenings, and feels that she is rested on many mornings upon arising. She does note definite inappropriate daytime sleepiness primarily in the afternoons, and she can doze easily with inactivity. She also gets sleepy at night while trying to watch television or movies. She denies sleepiness issues with driving. The patient states that her weight is up 12 pounds over the last 2 years, and her Epworth score today is 11.   Review of Systems  Constitutional: Negative for fever and unexpected weight change.  HENT: Positive for dental problem and ear pain. Negative for congestion, nosebleeds, postnasal drip, rhinorrhea, sinus pressure, sneezing, sore throat and trouble swallowing.   Eyes: Negative for redness and itching.  Respiratory: Negative for cough, chest tightness, shortness of breath and wheezing.   Cardiovascular: Negative for palpitations and leg swelling.  Gastrointestinal: Positive for abdominal pain. Negative for nausea and vomiting.  Genitourinary: Negative for dysuria.  Musculoskeletal: Negative for joint swelling.  Skin: Negative for rash.  Neurological: Negative for headaches.  Hematological: Does not bruise/bleed easily.    Psychiatric/Behavioral: Negative for dysphoric mood. The patient is not nervous/anxious.        Objective:   Physical Exam Constitutional:  Overweight female, no acute distress  HENT:  Nares patent without discharge, but deviated septum to left with narrowing  Oropharynx without exudate, palate and uvula are mildly elongated.   Eyes:  Perrla, eomi, no scleral icterus  Neck:  No JVD, no TMG  Cardiovascular:  Normal rate, regular rhythm, no rubs or gallops.  No murmurs        Intact distal pulses  Pulmonary :  Normal breath sounds, no stridor or respiratory distress   No rales, rhonchi, or wheezing  Abdominal:  Soft, nondistended, bowel sounds present.  No tenderness noted.   Musculoskeletal:  mild lower extremity edema noted.  Lymph Nodes:  No cervical lymphadenopathy noted  Skin:  No cyanosis noted  Neurologic:  Alert, appropriate, moves all 4 extremities without obvious deficit.         Assessment & Plan:

## 2014-04-09 NOTE — Assessment & Plan Note (Signed)
The patient has a history of moderate sleep apnea dating back to 2010, and was tried on C Pap for about 1-1/2 years. She discontinued the device because of lack of efficacy, chronic nasal congestion, and cost. She has not been treating her sleep apnea since that time, and she is clearly symptomatic during the day with inappropriate sleepiness. I have had a long discussion with her about sleep apnea, including its impact to her quality of life and cardiovascular health. She has some underlying medical issues which can be impacted by untreated sleep apnea. I have strongly recommended that she treat her sleep apnea, and have outlined the use of a dental appliance versus going back on C Pap. I have also encouraged her to work aggressively on weight loss. The patient would like to avoid C Pap if possible, will therefore send her to dental medicine for evaluation.

## 2014-04-09 NOTE — Patient Instructions (Signed)
Get back on your flonase and antihistamine and take every day if this truly helped your chronic nasal congestion. Will refer you to dental medicine for consideration of an oral appliance for treatment of your sleep apnea. Work on weight loss If you decide against the dental appliance, please call so that we can discuss going back on cpap.

## 2014-04-12 ENCOUNTER — Other Ambulatory Visit: Payer: Self-pay | Admitting: Family Medicine

## 2014-04-24 ENCOUNTER — Encounter: Payer: Self-pay | Admitting: *Deleted

## 2014-04-30 ENCOUNTER — Encounter: Payer: Self-pay | Admitting: Gastroenterology

## 2014-04-30 ENCOUNTER — Ambulatory Visit (INDEPENDENT_AMBULATORY_CARE_PROVIDER_SITE_OTHER): Payer: 59 | Admitting: Gastroenterology

## 2014-04-30 VITALS — BP 120/72 | HR 68 | Ht 62.5 in | Wt 192.2 lb

## 2014-04-30 DIAGNOSIS — K219 Gastro-esophageal reflux disease without esophagitis: Secondary | ICD-10-CM

## 2014-04-30 DIAGNOSIS — Z860101 Personal history of adenomatous and serrated colon polyps: Secondary | ICD-10-CM | POA: Insufficient documentation

## 2014-04-30 DIAGNOSIS — Z86010 Personal history of colonic polyps: Secondary | ICD-10-CM

## 2014-04-30 DIAGNOSIS — R1310 Dysphagia, unspecified: Secondary | ICD-10-CM

## 2014-04-30 NOTE — Patient Instructions (Addendum)
You will be due for a recall colonoscopy in 09/2015. We will send you a reminder in the mail when it gets closer to that time.   You have been scheduled for a modified barium swallow on Thursday, 05/16/14 at 1:00 pm. Please arrive 15 minutes prior to your test for registration. You will go to [facility] (1st Floor) for your appointment. Please refrain from eating or drinking anything 6 hours prior to your test. Should you need to cancel or reschedule your appointment, please contact [phone] [facility]) or [phone] [place]). _____________________________________________________________________ A Modified Barium Swallow Study, or MBS, is a special x-ray that is taken to check swallowing skills. It is carried out by a Stage manager and a Psychologist, clinical (SLP). During this test, your mouth, throat, and esophagus, a muscular tube which connects your mouth to your stomach, is checked. The test will help you, your doctor, and the SLP plan what types of foods and liquids are easier for you to swallow. The SLP will also identify positions and ways to help you swallow more easily and safely. What will happen during an MBS? You will be taken to an x-ray room and seated comfortably. You will be asked to swallow small amounts of food and liquid mixed with barium. Barium is a liquid or paste that allows images of your mouth, throat and esophagus to be seen on x-ray. The x-ray captures moving images of the food you are swallowing as it travels from your mouth through your throat and into your esophagus. This test helps identify whether food or liquid is entering your lungs (aspiration). The test also shows which part of your mouth or throat lacks strength or coordination to move the food or liquid in the right direction. This test typically takes 30 minutes to 1 hour to complete. _______________________________________________________________________   [name] have been scheduled for a  Barium Esophogram at [facility] Radiology (1st floor of the hospital) on 05/16/14 at 2:00 pm. Please arrive 15 minutes prior to your appointment for registration. Make certain not to have anything to eat or drink 6 hours prior to your test. If you need to reschedule for any reason, please contact radiology at [phone] to do so. __________________________________________________________________ A barium swallow is an examination that concentrates on views of the esophagus. This tends to be a double contrast exam (barium and two liquids which, when combined, create a gas to distend the wall of the oesophagus) or single contrast (non-ionic iodine based). The study is usually tailored to your symptoms so a good history is essential. Attention is paid during the study to the form, structure and configuration of the esophagus, looking for functional disorders (such as aspiration, dysphagia, achalasia, motility and reflux) EXAMINATION You may be asked to change into a gown, depending on the type of swallow being performed. A radiologist and radiographer will perform the procedure. The radiologist will advise you of the type of contrast selected for your procedure and direct you during the exam. You will be asked to stand, sit or lie in several different positions and to hold a small amount of fluid in your mouth before being asked to swallow while the imaging is performed .In some instances you may be asked to swallow barium coated marshmallows to assess the motility of a solid food bolus. The exam can be recorded as a digital or video fluoroscopy procedure. POST PROCEDURE It will take 1-2 days for the barium to pass through your system. To facilitate this, it is important,  unless otherwise directed, to increase your fluids for the next 24-48hrs and to resume your normal diet.  This test typically takes about 30 minutes to  perform. __________________________________________________________________________________  CC: [name]

## 2014-04-30 NOTE — Progress Notes (Signed)
    History of Present Illness: This is a 63 year old female, referred by Dr. Charlett Blake, with a long history of GERD. She relates her GERD symptoms have not been under good control for the past several months. She previously took Nexium 40 mg daily which was very effective in controlling her symptoms. She states this medication is no longer covered and she been taking omeprazole 20 mg daily and ranitidine 150 mg twice a day with frequent breakthrough symptoms. Over the past week or two her symptoms have come under better control for no clear reason. For the past few months she has also had problems swallowing liquids with choking, strangling and coughing. No solid food dysphagia. In 2008 she underwent upper endoscopy with an empiric dilation for dysphagia without a stricture noted.   Review of Systems: Pertinent positive and negative review of systems were noted in the above HPI section. All other review of systems were otherwise negative.  Current Medications, Allergies, Past Medical History, Past Surgical History, Family History and Social History were reviewed in [employer] record.  Physical Exam: General: Well developed , well nourished, no acute distress Head: Normocephalic and atraumatic Eyes:  sclerae anicteric, EOMI Ears: Normal auditory acuity Mouth: No deformity or lesions Neck: Supple, no masses or thyromegaly Lungs: Clear throughout to auscultation Heart: Regular rate and rhythm; no murmurs, rubs or bruits Abdomen: Soft, non tender and non distended. No masses, hepatosplenomegaly or hernias noted. Normal Bowel sounds Musculoskeletal: Symmetrical with no gross deformities  Skin: No lesions on visible extremities Pulses:  Normal pulses noted Extremities: No clubbing, cyanosis, edema or deformities noted Neurological: Alert oriented x 4, grossly nonfocal Cervical Nodes:  No significant cervical adenopathy Inguinal Nodes: No significant inguinal  adenopathy Psychological:  Alert and cooperative. Normal mood and affect  Assessment and Recommendations:  1. GERD, inadequate symptom control. Change to omeprazole 40 mg twice a day taken one half hour before breakfast and dinner. Discontinue ranitidine. Intensify antireflux measures.  2. Dysphagia to liquids with coughing and choking. Probable oropharyngeal symptoms. Schedule MBSS and barium esophagram.  3. Personal history of adenomatous colon polyps. Five-year interval surveillance colonoscopy recommended in April 2017.

## 2014-05-01 ENCOUNTER — Other Ambulatory Visit (HOSPITAL_COMMUNITY): Payer: Self-pay | Admitting: Gastroenterology

## 2014-05-01 DIAGNOSIS — R1314 Dysphagia, pharyngoesophageal phase: Secondary | ICD-10-CM

## 2014-05-15 ENCOUNTER — Other Ambulatory Visit: Payer: Self-pay | Admitting: Family Medicine

## 2014-05-16 ENCOUNTER — Ambulatory Visit (HOSPITAL_COMMUNITY)
Admission: RE | Admit: 2014-05-16 | Discharge: 2014-05-16 | Disposition: A | Discharge status: Home / Self Care | Payer: 59 | Source: Ambulatory Visit | Attending: Gastroenterology | Admitting: Gastroenterology

## 2014-05-16 ENCOUNTER — Telehealth: Payer: Self-pay | Admitting: Gastroenterology

## 2014-05-16 DIAGNOSIS — E119 Type 2 diabetes mellitus without complications: Secondary | ICD-10-CM | POA: Insufficient documentation

## 2014-05-16 DIAGNOSIS — I10 Essential (primary) hypertension: Secondary | ICD-10-CM | POA: Diagnosis not present

## 2014-05-16 DIAGNOSIS — R05 Cough: Secondary | ICD-10-CM | POA: Diagnosis not present

## 2014-05-16 DIAGNOSIS — K219 Gastro-esophageal reflux disease without esophagitis: Secondary | ICD-10-CM | POA: Insufficient documentation

## 2014-05-16 DIAGNOSIS — R1310 Dysphagia, unspecified: Secondary | ICD-10-CM | POA: Diagnosis not present

## 2014-05-16 DIAGNOSIS — R1314 Dysphagia, pharyngoesophageal phase: Secondary | ICD-10-CM

## 2014-05-16 DIAGNOSIS — K222 Esophageal obstruction: Secondary | ICD-10-CM | POA: Diagnosis not present

## 2014-05-16 MED ORDER — OMEPRAZOLE 40 MG PO CPDR: 40.0000 mg | DELAYED_RELEASE_CAPSULE | Freq: Two times a day (BID) | ORAL | Status: DC

## 2014-05-16 NOTE — Telephone Encounter (Signed)
New rx sent

## 2014-05-16 NOTE — Telephone Encounter (Signed)
Omeprazole 40 mg po bid to better control GERD especially since she now has an esophageal stricture on BA esophagram indicating inadequate GERD control

## 2014-05-16 NOTE — Telephone Encounter (Signed)
Dr Fuller Plan, at patient's office visit on 04/30/14, you indicated that patient should increase omeprazole to 40 mg twice daily. However, it looks like patient has been on omeprazole 20 mg from Dr Inda Castle. Does she indeed need 40 mg twice daily or do you want me to give her 20 mg twice daily?

## 2014-05-16 NOTE — Procedures (Signed)
Objective Swallowing Evaluation: Modified Barium Swallowing Study  Patient Details  Name: [name] MRN: [id number] Date of Birth: 07/16/1950  Today's Date: 05/16/2014 Time: 1245-1300 SLP Time Calculation (min) (ACUTE ONLY): 15 min  Past Medical History:  Past Medical History  Diagnosis Date  . Diaphragmatic hernia without mention of obstruction or gangrene   . Nonspecific abnormal results of liver function study   . Plantar fascial fibromatosis   . Esophageal reflux   . Unspecified sleep apnea   . Hyperlipidemia   . Diabetes mellitus 52    type 2  . Hypertension 52  . Allergy     seasonal  . Endometriosis   . Arthritis     knee-left  . Depression     job/stress related  . Pharyngitis 02/18/2012  . Nonalcoholic fatty liver disease 04/28/2010    Qualifier: Diagnosis of  By: Arnoldo Morale MD, [facility] 01/22/2013  . GERD (gastroesophageal reflux disease)   . Tubular adenoma of colon   . Hyperplastic colon polyp    Past Surgical History:  Past Surgical History  Procedure Laterality Date  . Appendectomy    . Benigh cyst in lung  2006    right  . Abdominal hysterectomy  1982    partial  . Tubal ligation    . Seba    . Hernia repair     HPI:  63 yo female referred by Dr Fuller Plan for [facility] due to pt complaint of ongoing dysphagia.  Pt with PMH + for DM, HTN, obesity, sleep apnea, reflux, ? stenosis/stricture of esophagus - empirically dilated 2008 per chart review.  Pt reports sensation of having a"bubble" pointing to her proximal esophagus impairing pt's ability to swallow. She further reports she can frequently choke without of choking on liquids if she is not careful.  Pt denies neuro diagnosis and she denies pill dysphagia.       Assessment / Plan / Recommendation Clinical Impression  Dysphagia Diagnosis: Within Functional Limits Clinical impression: Pt presents with functional oropharyngeal swallow ability without aspiration/penetration of any  consistency tested.  Pt intentionally orally holding liquids to avoid "choking" per her statement- therefore this is not aberrant but rather her self created compensation strategy.  Swallow was timely and strong without delay or residuals.      Treatment Recommendation    n/a   Diet Recommendation Regular;Thin liquid   Liquid Administration via: Cup;Straw Medication Administration: Whole meds with liquid Supervision: Patient able to self feed Compensations: Slow rate;Small sips/bites Postural Changes and/or Swallow Maneuvers: Upright 30-60 min after meal;Seated upright 90 degrees    Other  Recommendations Oral Care Recommendations: Oral care BID   Follow Up Recommendations  None      General Date of Onset: 05/16/14 HPI: 63 yo female referred by Dr Fuller Plan for MBSS due to pt complaint of ongoing dysphagia.  Pt with PMH + for DM, HTN, obesity, sleep apnea, reflux, ? stenosis/stricture of esophagus - empirically dilated 2008 per chart review.  Pt reports sensation of having a"bubble" pointing to her proximal esophagus impairing pt's ability to swallow. She further reports she can frequently choke without of choking on liquids if she is not careful.  Pt denies neuro diagnosis and she denies pill dysphagia.   Type of Study: Modified Barium Swallowing Study Reason for Referral: Objectively evaluate swallowing function Diet Prior to this Study: Regular;Thin liquids Temperature Spikes Noted: No Respiratory Status: Room air History of Recent Intubation: No Behavior/Cognition: Alert;Cooperative;Pleasant mood Oral Motor /  Sensory Function: Within functional limits Self-Feeding Abilities: Able to feed self Patient Positioning: Upright in chair Baseline Vocal Quality: Clear Volitional Cough: Strong Volitional Swallow: Able to elicit Anatomy: Within functional limits    Reason for Referral Objectively evaluate swallowing function   Oral Phase Oral Preparation/Oral Phase Oral Phase:  Impaired Oral - Thin Oral - Thin Cup: Delayed oral transit Oral - Thin Straw: Delayed oral transit Oral - Solids Oral - Puree: Within functional limits Oral - Regular: Within functional limits Oral Phase - Comment Oral Phase - Comment: pt intentionally orally holding liquids to avoid "choking" per her statement   Pharyngeal Phase Pharyngeal Phase Pharyngeal Phase: Within functional limits Pharyngeal - Thin Pharyngeal - Thin Cup: Within functional limits Pharyngeal - Thin Straw: Within functional limits Pharyngeal - Solids Pharyngeal - Puree: Within functional limits Pharyngeal - Regular: Within functional limits  Cervical Esophageal Phase    GO    Cervical Esophageal Phase Cervical Esophageal Phase:  (appearance of barium stasis in esophagus WITHOUT pt awareness, SLP viewed esophagus to assure clearance for esophagram following MBSS)    Functional Assessment Tool Used: mbs, clinical judgement Functional Limitations: Swallowing Swallow Current Status (I3254): At least 1 percent but less than 20 percent impaired, limited or restricted Swallow Goal Status [phone]): At least 1 percent but less than 20 percent impaired, limited or restricted Swallow Discharge Status [phone]): At least 1 percent but less than 20 percent impaired, limited or restricted    Claudie Fisherman, [place] [facility] SLP [phone]

## 2014-05-17 ENCOUNTER — Telehealth: Payer: Self-pay | Admitting: *Deleted

## 2014-05-17 ENCOUNTER — Other Ambulatory Visit: Payer: Self-pay

## 2014-05-17 DIAGNOSIS — E785 Hyperlipidemia, unspecified: Secondary | ICD-10-CM

## 2014-05-17 DIAGNOSIS — E119 Type 2 diabetes mellitus without complications: Secondary | ICD-10-CM

## 2014-05-17 MED ORDER — METFORMIN HCL 1000 MG PO TABS: 1000.0000 mg | ORAL_TABLET | Freq: Two times a day (BID) | ORAL | Status: DC

## 2014-05-17 NOTE — Telephone Encounter (Signed)
Received fax from [facility] for refill of metformin.  Pt last seen 02/25/14 and advised 3 month f/u. 30 day supply x 1 refill sent to pharmacy as pt is due for follow up this month.  Please call pt to arrange appt.

## 2014-05-20 ENCOUNTER — Ambulatory Visit (AMBULATORY_SURGERY_CENTER): Payer: Self-pay | Admitting: *Deleted

## 2014-05-20 VITALS — Ht 62.5 in | Wt 192.0 lb

## 2014-05-20 DIAGNOSIS — K222 Esophageal obstruction: Secondary | ICD-10-CM

## 2014-05-20 NOTE — Progress Notes (Signed)
Patient denies any allergies to eggs or soy. Patient denies any problems with anesthesia/sedation. Patient denies any oxygen use at home and does not take any diet/weight loss medications. EMMI education assisgned to patient on EGD, this was explained and instructions given to patient. 

## 2014-05-20 NOTE — Telephone Encounter (Signed)
Please order hgba1c, lipid, renal, cbc, tsh, hepatic for the week prior to her visit for DM 2 and essential HTN

## 2014-05-20 NOTE — Telephone Encounter (Signed)
  This is Dr. Frederik Pear patient.

## 2014-05-20 NOTE — Telephone Encounter (Signed)
Please advise 

## 2014-05-20 NOTE — Telephone Encounter (Signed)
Pt scheduled lab appointment 06/20/13 and follow up 06/27/2014. Please place lab orders

## 2014-05-20 NOTE — Telephone Encounter (Signed)
Orders placed.

## 2014-05-28 ENCOUNTER — Encounter: Payer: Self-pay | Admitting: Gastroenterology

## 2014-05-28 ENCOUNTER — Ambulatory Visit (AMBULATORY_SURGERY_CENTER): Payer: 59 | Admitting: Gastroenterology

## 2014-05-28 VITALS — BP 124/79 | HR 72 | Temp 96.5°F | Resp 16 | Ht 62.0 in | Wt 192.0 lb

## 2014-05-28 DIAGNOSIS — K222 Esophageal obstruction: Secondary | ICD-10-CM

## 2014-05-28 DIAGNOSIS — K2950 Unspecified chronic gastritis without bleeding: Secondary | ICD-10-CM

## 2014-05-28 DIAGNOSIS — R1314 Dysphagia, pharyngoesophageal phase: Secondary | ICD-10-CM

## 2014-05-28 LAB — GLUCOSE, CAPILLARY: Glucose-Capillary: 131 mg/dL — ABNORMAL HIGH (ref 70–99)

## 2014-05-28 MED ORDER — SODIUM CHLORIDE 0.9 % IV SOLN: 500.0000 mL | INTRAVENOUS | Status: DC

## 2014-05-28 NOTE — Progress Notes (Signed)
  [facility] Post-op Note  Patient: [name]  Procedure(s) Performed: endoscopy with dilatation  Patient Location: [facility] - Recovery Area  Anesthesia Type: Deep Sedation/Propofol  Level of Consciousness: awake, oriented and patient cooperative  Airway and Oxygen Therapy: Patient Spontanous Breathing  Post-op Pain: none  Post-op Assessment:  Post-op Vital signs reviewed, Patient's Cardiovascular Status Stable, Respiratory Function Stable, Patent Airway, No signs of Nausea or vomiting and Pain level controlled  Post-op Vital Signs: Reviewed and stable  Complications: No apparent anesthesia complications  Oliverio Cho E 9:49 AM

## 2014-05-28 NOTE — Patient Instructions (Addendum)
YOU HAD AN ENDOSCOPIC PROCEDURE TODAY AT THE [facility]: Refer to the procedure report that was given to you for any specific questions about what was found during the examination.  If the procedure report does not answer your questions, please call your gastroenterologist to clarify.  If you requested that your care partner not be given the details of your procedure findings, then the procedure report has been included in a sealed envelope for you to review at your convenience later.  YOU SHOULD EXPECT: Some feelings of bloating in the abdomen. Passage of more gas than usual.  Walking can help get rid of the air that was put into your GI tract during the procedure and reduce the bloating. If you had a lower endoscopy (such as a colonoscopy or flexible sigmoidoscopy) you may notice spotting of blood in your stool or on the toilet paper. If you underwent a bowel prep for your procedure, then you may not have a normal bowel movement for a few days.  DIET: Your first meal following the procedure should be a light meal and then it is ok to progress to your normal diet.  A half-sandwich or bowl of soup is an example of a good first meal.  Heavy or fried foods are harder to digest and may make you feel nauseous or bloated.  Likewise meals heavy in dairy and vegetables can cause extra gas to form and this can also increase the bloating.  Drink plenty of fluids but you should avoid alcoholic beverages for 24 hours.  ACTIVITY: Your care partner should take you home directly after the procedure.  You should plan to take it easy, moving slowly for the rest of the day.  You can resume normal activity the day after the procedure however you should NOT DRIVE or use heavy machinery for 24 hours (because of the sedation medicines used during the test).    SYMPTOMS TO REPORT IMMEDIATELY: A gastroenterologist can be reached at any hour.  During normal business hours, 8:30 AM to 5:00 PM Monday through Friday,  call [phone].  After hours and on weekends, please call the GI answering service at [phone] who will take a message and have the physician on call contact you.   Following lower endoscopy (colonoscopy or flexible sigmoidoscopy):  Excessive amounts of blood in the stool  Significant tenderness or worsening of abdominal pains  Swelling of the abdomen that is new, acute  Fever of 100F or higher  Following upper endoscopy (EGD)  Vomiting of blood or coffee ground material  New chest pain or pain under the shoulder blades  Painful or persistently difficult swallowing  New shortness of breath  Fever of 100F or higher  Black, tarry-looking stools  FOLLOW UP: If any biopsies were taken you will be contacted by phone or by letter within the next 1-3 weeks.  Call your gastroenterologist if you have not heard about the biopsies in 3 weeks.  Our staff will call the home number listed on your records the next business day following your procedure to check on you and address any questions or concerns that you may have at that time regarding the information given to you following your procedure. This is a courtesy call and so if there is no answer at the home number and we have not heard from you through the emergency physician on call, we will assume that you have returned to your regular daily activities without incident.  SIGNATURES/CONFIDENTIALITY: You and/or your care   partner have signed paperwork which will be entered into your electronic medical record.  These signatures attest to the fact that that the information above on your After Visit Summary has been reviewed and is understood.  Full responsibility of the confidentiality of this discharge information lies with you and/or your care-partner.  Recommendations Pathology results. Anti-reflux regimen. Post dilation diet instructions given, gastritis, and stricture handouts provided to patient/care partner. Office appointment in  one month.

## 2014-05-28 NOTE — Progress Notes (Signed)
Called to room to assist during endoscopic procedure.  Patient ID and intended procedure confirmed with present staff. Received instructions for my participation in the procedure from the performing physician.  

## 2014-05-28 NOTE — Op Note (Signed)
[facility]  [address]. [place], [address]   ENDOSCOPY PROCEDURE REPORT  PATIENT: [name], [name]  MR#: [id number] BIRTHDATE: 09-06-1950 , 39  yrs. old GENDER: female ENDOSCOPIST: Ladene Artist, MD, [facility] PROCEDURE DATE:  05/28/2014 PROCEDURE:  EGD w/ biopsy and EGD w/ wire guided (savary) dilation  ASA CLASS:     Class II INDICATIONS:  dysphagia and abnormal barium esophagogram. MEDICATIONS: Monitored anesthesia care and Propofol 200 mg IV TOPICAL ANESTHETIC: none DESCRIPTION OF PROCEDURE: After the risks benefits and alternatives of the procedure were thoroughly explained, informed consent was obtained.  The LB [id number] endoscope was introduced through the mouth and advanced to the second portion of the duodenum , Without limitations.  The instrument was slowly withdrawn as the mucosa was fully examined.  ESOPHAGUS: There was a benign appearing stricture with an inner diameter of 21mm in the distal esophagus.  The stricture was easily traversable and was located 2 cm above the EGJ. The lumen had a smooth, narrowed segment concerning for a submucosal lesion or extrinsic compression.  The stricture was dilated using a 44mm (42Fr) savary dilator over guidewire.  The stricture was dilated using a 81mm (45Fr) savary dilator over guidewire.  The stricture was dilated using a 45mm (48Fr) savary dilator over guidewire. Mild resistance and no heme no ted.  The esophagus was otherwise normal. STOMACH: Gastritis was found in the gastric body.  Multiple biopsies were performed.   The stomach otherwise appeared normal. DUODENUM: The duodenal mucosa showed no abnormalities in the bulb and 2nd part of the duodenum.  Retroflexed views revealed no abnormalities.     The scope was then withdrawn from the patient and the procedure completed.  COMPLICATIONS: There were no immediate complications.  ENDOSCOPIC IMPRESSION: 1.   Stricture in the distal esophagus;  dilated using savary dilators over guidewire 2.   Gastritis in the gastric body; multiple biopsies performed 3.   The EGD otherwise appeared normal  RECOMMENDATIONS: 1.  Anti-reflux regimen 2.  Continue PPI 3.  Await pathology results 4.  Post dilation instructions 5.  Schedule an office appointment for 1 month  eSigned:  Ladene Artist, MD, [facility] 05/28/2014 9:54 AM

## 2014-05-29 ENCOUNTER — Telehealth: Payer: Self-pay

## 2014-05-29 NOTE — Telephone Encounter (Signed)
  Follow up Call-  Call back number 05/28/2014  Post procedure Call Back phone  # [phone]  Permission to leave phone message Yes     Patient questions:  Do you have a fever, pain , or abdominal swelling? No. Pain Score  0 *  Have you tolerated food without any problems? Yes.    Have you been able to return to your normal activities? Yes.    Do you have any questions about your discharge instructions: Diet   No. Medications  No. Follow up visit  No.  Do you have questions or concerns about your Care? No.  Actions: * If pain score is 4 or above: No action needed, pain <4.

## 2014-06-07 ENCOUNTER — Encounter: Payer: Self-pay | Admitting: Gastroenterology

## 2014-06-20 ENCOUNTER — Other Ambulatory Visit (INDEPENDENT_AMBULATORY_CARE_PROVIDER_SITE_OTHER): Payer: 59

## 2014-06-20 DIAGNOSIS — I10 Essential (primary) hypertension: Secondary | ICD-10-CM

## 2014-06-20 DIAGNOSIS — K760 Fatty (change of) liver, not elsewhere classified: Secondary | ICD-10-CM

## 2014-06-20 DIAGNOSIS — E119 Type 2 diabetes mellitus without complications: Secondary | ICD-10-CM

## 2014-06-20 DIAGNOSIS — E785 Hyperlipidemia, unspecified: Secondary | ICD-10-CM

## 2014-06-20 LAB — LIPID PANEL
CHOLESTEROL: 216 mg/dL — AB (ref 0–200)
HDL: 42.9 mg/dL (ref >39.00)
LDL CALC: 145 mg/dL — AB (ref 0–99)
NonHDL: 173.1
Total CHOL/HDL Ratio: 5
Triglycerides: 139 mg/dL (ref 0.0–149.0)
VLDL: 27.8 mg/dL (ref 0.0–40.0)

## 2014-06-20 LAB — HEMOGLOBIN A1C: Hgb A1c MFr Bld: 7.7 % — ABNORMAL HIGH (ref 4.6–6.5)

## 2014-06-20 LAB — HEPATIC FUNCTION PANEL
ALT: 44 U/L — AB (ref 0–35)
AST: 32 U/L (ref 0–37)
Albumin: 3.8 g/dL (ref 3.5–5.2)
Alkaline Phosphatase: 70 U/L (ref 39–117)
Bilirubin, Direct: 0 mg/dL (ref 0.0–0.3)
Total Bilirubin: 0.4 mg/dL (ref 0.2–1.2)
Total Protein: 7.2 g/dL (ref 6.0–8.3)

## 2014-06-20 LAB — TSH: TSH: 1.93 u[IU]/mL (ref 0.35–4.50)

## 2014-06-20 LAB — RENAL FUNCTION PANEL
Albumin: 3.8 g/dL (ref 3.5–5.2)
BUN: 12 mg/dL (ref 6–23)
CALCIUM: 9.3 mg/dL (ref 8.4–10.5)
CHLORIDE: 105 meq/L (ref 96–112)
CO2: 25 meq/L (ref 19–32)
CREATININE: 0.9 mg/dL (ref 0.4–1.2)
GFR: 81.65 mL/min (ref >60.00)
Glucose, Bld: 140 mg/dL — ABNORMAL HIGH (ref 70–99)
Phosphorus: 3.6 mg/dL (ref 2.3–4.6)
Potassium: 3.9 mEq/L (ref 3.5–5.1)
SODIUM: 140 meq/L (ref 135–145)

## 2014-06-20 LAB — CBC
HCT: 41.2 % (ref 36.0–46.0)
Hemoglobin: 13.2 g/dL (ref 12.0–15.0)
MCHC: 32.1 g/dL (ref 30.0–36.0)
MCV: 80.6 fl (ref 78.0–100.0)
PLATELETS: 230 10*3/uL (ref 150.0–400.0)
RBC: 5.11 Mil/uL (ref 3.87–5.11)
RDW: 14.8 % (ref 11.5–15.5)
WBC: 5.7 10*3/uL (ref 4.0–10.5)

## 2014-06-24 ENCOUNTER — Telehealth: Payer: Self-pay | Admitting: Gastroenterology

## 2014-06-24 NOTE — Telephone Encounter (Signed)
Discussed gastritis.  All questions answered.  She will call back back for any additional questions or concerns

## 2014-06-27 ENCOUNTER — Ambulatory Visit (INDEPENDENT_AMBULATORY_CARE_PROVIDER_SITE_OTHER): Payer: 59 | Admitting: Family Medicine

## 2014-06-27 ENCOUNTER — Encounter: Payer: Self-pay | Admitting: Family Medicine

## 2014-06-27 VITALS — BP 115/70 | HR 78 | Temp 98.6°F | Ht 62.5 in | Wt 192.6 lb

## 2014-06-27 DIAGNOSIS — E669 Obesity, unspecified: Secondary | ICD-10-CM

## 2014-06-27 DIAGNOSIS — E1169 Type 2 diabetes mellitus with other specified complication: Secondary | ICD-10-CM

## 2014-06-27 DIAGNOSIS — I10 Essential (primary) hypertension: Secondary | ICD-10-CM

## 2014-06-27 DIAGNOSIS — E119 Type 2 diabetes mellitus without complications: Secondary | ICD-10-CM

## 2014-06-27 DIAGNOSIS — E782 Mixed hyperlipidemia: Secondary | ICD-10-CM

## 2014-06-27 DIAGNOSIS — K219 Gastro-esophageal reflux disease without esophagitis: Secondary | ICD-10-CM

## 2014-06-27 DIAGNOSIS — G4733 Obstructive sleep apnea (adult) (pediatric): Secondary | ICD-10-CM

## 2014-06-27 DIAGNOSIS — E785 Hyperlipidemia, unspecified: Secondary | ICD-10-CM

## 2014-06-27 MED ORDER — ROSUVASTATIN CALCIUM 5 MG PO TABS: 5.0000 mg | ORAL_TABLET | Freq: Every day | ORAL | Status: DC

## 2014-06-27 MED ORDER — RANITIDINE HCL 300 MG PO TABS: 300.0000 mg | ORAL_TABLET | Freq: Every evening | ORAL | Status: DC | PRN

## 2014-06-27 MED ORDER — METFORMIN HCL 1000 MG PO TABS: ORAL_TABLET | ORAL | Status: DC

## 2014-06-27 NOTE — Patient Instructions (Addendum)
Melatonin 2-5 mg at bed as needed   Start the Crestor 5 mg 1/2 tab twice a day     Insomnia Insomnia is frequent trouble falling and/or staying asleep. Insomnia can be a long term problem or a short term problem. Both are common. Insomnia can be a short term problem when the wakefulness is related to a certain stress or worry. Long term insomnia is often related to ongoing stress during waking hours and/or poor sleeping habits. Overtime, sleep deprivation itself can make the problem worse. Every little thing feels more severe because you are overtired and your ability to cope is decreased. CAUSES   Stress, anxiety, and depression.  Poor sleeping habits.  Distractions such as TV in the bedroom.  Naps close to bedtime.  Engaging in emotionally charged conversations before bed.  Technical reading before sleep.  Alcohol and other sedatives. They may make the problem worse. They can hurt normal sleep patterns and normal dream activity.  Stimulants such as caffeine for several hours prior to bedtime.  Pain syndromes and shortness of breath can cause insomnia.  Exercise late at night.  Changing time zones may cause sleeping problems (jet lag).s It is sometimes helpful to have someone observe your sleeping patterns. They should look for periods of not breathing during the night (sleep apnea). They should also look to see how long those periods last. If you live alone or observers are uncertain, you can also be observed at a sleep clinic where your sleep patterns will be professionally monitored. Sleep apnea requires a checkup and treatment. Give your caregivers your medical history. Give your caregivers observations your family has made about your sleep.  SYMPTOMS   Not feeling rested in the morning.  Anxiety and restlessness at bedtime.  Difficulty falling and staying asleep. TREATMENT   Your caregiver may prescribe treatment for an underlying medical disorders. Your caregiver  can give advice or help if you are using alcohol or other drugs for self-medication. Treatment of underlying problems will usually eliminate insomnia problems.  Medications can be prescribed for short time use. They are generally not recommended for lengthy use.  Over-the-counter sleep medicines are not recommended for lengthy use. They can be habit forming.  You can promote easier sleeping by making lifestyle changes such as:  Using relaxation techniques that help with breathing and reduce muscle tension.  Exercising earlier in the day.  Changing your diet and the time of your last meal. No night time snacks.  Establish a regular time to go to bed.  Counseling can help with stressful problems and worry.  Soothing music and white noise may be helpful if there are background noises you cannot remove.  Stop tedious detailed work at least one hour before bedtime. HOME CARE INSTRUCTIONS   Keep a diary. Inform your caregiver about your progress. This includes any medication side effects. See your caregiver regularly. Take note of:  Times when you are asleep.  Times when you are awake during the night.  The quality of your sleep.  How you feel the next day. This information will help your caregiver care for you.  Get out of bed if you are still awake after 15 minutes. Read or do some quiet activity. Keep the lights down. Wait until you feel sleepy and go back to bed.  Keep regular sleeping and waking hours. Avoid naps.  Exercise regularly.  Avoid distractions at bedtime. Distractions include watching television or engaging in any intense or detailed activity like attempting to balance  the household checkbook.  Develop a bedtime ritual. Keep a familiar routine of bathing, brushing your teeth, climbing into bed at the same time each night, listening to soothing music. Routines increase the success of falling to sleep faster.  Use relaxation techniques. This can be using breathing  and muscle tension release routines. It can also include visualizing peaceful scenes. You can also help control troubling or intruding thoughts by keeping your mind occupied with boring or repetitive thoughts like the old concept of counting sheep. You can make it more creative like imagining planting one beautiful flower after another in your backyard garden.  During your day, work to eliminate stress. When this is not possible use some of the previous suggestions to help reduce the anxiety that accompanies stressful situations. MAKE SURE YOU:   Understand these instructions.  Will watch your condition.  Will get help right away if you are not doing well or get worse. Document Released: 05/28/2000 Document Revised: 08/23/2011 Document Reviewed: 06/28/2007 [facility] Patient Information 2015 [place], [place]. This information is not intended to replace advice given to you by your health care provider. Make sure you discuss any questions you have with your health care provider.

## 2014-06-27 NOTE — Progress Notes (Signed)
Pre visit review using our clinic review tool, if applicable. No additional management support is needed unless otherwise documented below in the visit note. 

## 2014-07-07 NOTE — Assessment & Plan Note (Signed)
Well controlled, no changes to meds. Encouraged heart healthy diet such as the DASH diet and exercise as tolerated.  °

## 2014-07-07 NOTE — Assessment & Plan Note (Signed)
Avoid offending foods, start probiotics. Do not eat large meals in late evening and consider raising head of bed.  

## 2014-07-07 NOTE — Assessment & Plan Note (Signed)
Tolerating statin, encouraged heart healthy diet, avoid trans fats, minimize simple carbs and saturated fats. Increase exercise as tolerated 

## 2014-07-07 NOTE — Progress Notes (Signed)
Patient ID: [name], female   DOB: Oct 21, 1950, 64 y.o.   MRN: [id number]   [name]  [id number] [birth year] 07/07/2014      Progress Note-Follow Up  Subjective  Chief Complaint  Chief Complaint  Patient presents with  . Follow-up    4 mos    HPI  Patient is a 64 y.o. female in today for routine medical care. She is doing well. Is sleeping better with treatment. Feels rested. Stopped atorvastatin due to myalgias. No recent illness. Continues to struggle with intermittent back pain. Denies CP/palp/SOB/HA/congestion/fevers/GI or GU c/o. Taking meds as prescribed  Past Medical History  Diagnosis Date  . Diaphragmatic hernia without mention of obstruction or gangrene   . Nonspecific abnormal results of liver function study   . Plantar fascial fibromatosis   . Esophageal reflux   . Unspecified sleep apnea   . Hyperlipidemia   . Diabetes mellitus 52    type 2  . Hypertension 52  . Allergy     seasonal  . Endometriosis   . Arthritis     knee-left  . Depression     job/stress related  . Pharyngitis 02/18/2012  . Nonalcoholic fatty liver disease 04/28/2010    Qualifier: Diagnosis of  By: Arnoldo Morale MD, [facility] 01/22/2013  . GERD (gastroesophageal reflux disease)   . Tubular adenoma of colon   . Hyperplastic colon polyp   . Sleep apnea     mouth piece per pt.    Past Surgical History  Procedure Laterality Date  . Appendectomy    . Benigh cyst in lung  2006    right  . Abdominal hysterectomy  1982    partial  . Tubal ligation    . Seba    . Hernia repair      Family History  Problem Relation Age of Onset  . Breast cancer Mother   . Hypertension Mother   . Diabetes Mother     type 2  . Other Mother     esophagus surgery  . Heart disease Father   . Hyperlipidemia Father   . Hypertension Father   . Heart attack Father     MI at age 32  . Diabetes Brother     type 2  . Heart disease Maternal Grandmother   . Heart attack  Maternal Grandfather   . Hypertension Maternal Grandfather   . Stroke Paternal Grandmother   . Hypertension Sister   . Diabetes Sister   . Hypertension Brother   . Alcohol abuse Maternal Aunt   . Colon cancer Neg Hx   . Stomach cancer Neg Hx   . Esophageal cancer Neg Hx     History   Social History  . Marital Status: Married    Spouse Name: [name]    Number of Children: 2  . Years of Education: N/A   Occupational History  . Designer, television/film set    Social History Main Topics  . Smoking status: Never Smoker   . Smokeless tobacco: Never Used  . Alcohol Use: No  . Drug Use: No  . Sexual Activity: Yes     Comment: lives with husband, no dietary restrictions just watching carbs, wears seat belt   Other Topics Concern  . Not on file   Social History Narrative    Current Outpatient Prescriptions on File Prior to Visit  Medication Sig Dispense Refill  . ASPIR-LOW 81 MG EC tablet TAKE 1 TABLET BY MOUTH DAILY 30 tablet  3  . BAYER CONTOUR TEST test strip USE AS INSTRUCTED 100 each 1  . bisoprolol-hydrochlorothiazide (ZIAC) 5-6.25 MG per tablet TAKE 1 TABLET BY MOUTH DAILY. 30 tablet 3  . INVOKANA 300 MG TABS tablet TAKE 1 TABLET BY MOUTH DAILY 30 tablet 3  . KRILL OIL PO Take 1 tablet by mouth daily.     Marland Kitchen omeprazole (PRILOSEC) 40 MG capsule Take 1 capsule (40 mg total) by mouth 2 (two) times daily. 60 capsule 2  . Probiotic Product (PROBIOTIC DAILY PO) Take 1 capsule by mouth daily.     No current facility-administered medications on file prior to visit.    Allergies  Allergen Reactions  . Pravastatin Other (See Comments)    Myalgia, fatigue, memory loss  . Amoxicillin Itching    REACTION: rash  . Atorvastatin Other (See Comments)    Myalgia, fatigue, memory loss  . Codeine Nausea And Vomiting    Review of Systems  Review of Systems  Constitutional: Negative for fever and malaise/fatigue.  HENT: Negative for congestion.   Eyes: Negative for discharge.  Respiratory:  Negative for shortness of breath.   Cardiovascular: Negative for chest pain, palpitations and leg swelling.  Gastrointestinal: Negative for nausea, abdominal pain and diarrhea.  Genitourinary: Negative for dysuria.  Musculoskeletal: Negative for falls.  Skin: Negative for rash.  Neurological: Negative for loss of consciousness and headaches.  Endo/Heme/Allergies: Negative for polydipsia.  Psychiatric/Behavioral: Negative for depression and suicidal ideas. The patient is not nervous/anxious and does not have insomnia.     Objective  BP 115/70 mmHg  Pulse 78  Temp(Src) 98.6 F (37 C) (Oral)  Ht 5' 2.5" (1.588 m)  Wt 192 lb 9.6 oz (87.363 kg)  BMI 34.64 kg/m2  SpO2 100%  Physical Exam  Physical Exam  Constitutional: She is oriented to person, place, and time and well-developed, well-nourished, and in no distress. No distress.  HENT:  Head: Normocephalic and atraumatic.  Eyes: Conjunctivae are normal.  Neck: Neck supple. No thyromegaly present.  Cardiovascular: Normal rate, regular rhythm and normal heart sounds.   No murmur heard. Pulmonary/Chest: Effort normal and breath sounds normal. She has no wheezes.  Abdominal: She exhibits no distension and no mass.  Musculoskeletal: She exhibits no edema.  Lymphadenopathy:    She has no cervical adenopathy.  Neurological: She is alert and oriented to person, place, and time.  Skin: Skin is warm and dry. No rash noted. She is not diaphoretic.  Psychiatric: Memory, affect and judgment normal.    Lab Results  Component Value Date   TSH 1.93 06/20/2014   Lab Results  Component Value Date   WBC 5.7 06/20/2014   HGB 13.2 06/20/2014   HCT 41.2 06/20/2014   MCV 80.6 06/20/2014   PLT 230.0 06/20/2014   Lab Results  Component Value Date   CREATININE 0.9 06/20/2014   BUN 12 06/20/2014   NA 140 06/20/2014   K 3.9 06/20/2014   CL 105 06/20/2014   CO2 25 06/20/2014   Lab Results  Component Value Date   ALT 44* 06/20/2014    AST 32 06/20/2014   ALKPHOS 70 06/20/2014   BILITOT 0.4 06/20/2014   Lab Results  Component Value Date   CHOL 216* 06/20/2014   Lab Results  Component Value Date   HDL 42.90 06/20/2014   Lab Results  Component Value Date   LDLCALC 145* 06/20/2014   Lab Results  Component Value Date   TRIG 139.0 06/20/2014   Lab Results  Component  Value Date   CHOLHDL 5 06/20/2014     Assessment & Plan  Essential hypertension Well controlled, no changes to meds. Encouraged heart healthy diet such as the DASH diet and exercise as tolerated.    GERD Avoid offending foods, start probiotics. Do not eat large meals in late evening and consider raising head of bed.    Hyperlipidemia, mixed Tolerating statin, encouraged heart healthy diet, avoid trans fats, minimize simple carbs and saturated fats. Increase exercise as tolerated   OSA (obstructive sleep apnea) Is tolerating dental device and sleeping well.

## 2014-07-07 NOTE — Assessment & Plan Note (Signed)
Is tolerating dental device and sleeping well.

## 2014-07-26 ENCOUNTER — Encounter: Payer: Self-pay | Admitting: Gastroenterology

## 2014-07-26 ENCOUNTER — Ambulatory Visit (INDEPENDENT_AMBULATORY_CARE_PROVIDER_SITE_OTHER): Payer: 59 | Admitting: Gastroenterology

## 2014-07-26 VITALS — BP 116/68 | HR 76 | Ht 62.5 in | Wt 190.1 lb

## 2014-07-26 DIAGNOSIS — IMO0001 Reserved for inherently not codable concepts without codable children: Secondary | ICD-10-CM

## 2014-07-26 DIAGNOSIS — R143 Flatulence: Secondary | ICD-10-CM

## 2014-07-26 DIAGNOSIS — K222 Esophageal obstruction: Secondary | ICD-10-CM

## 2014-07-26 DIAGNOSIS — K219 Gastro-esophageal reflux disease without esophagitis: Secondary | ICD-10-CM

## 2014-07-26 NOTE — Progress Notes (Signed)
History of Present Illness: This is a 64 year old female returning for follow-up after EGD and dilation. She has complete had a complete resolution of dysphagia. Her reflux symptoms appear to be under very good control on her current regimen. She complains of intestinal gas and bloating. Her symptoms are most bothersome in the mornings.  Allergies  Allergen Reactions  . Pravastatin Other (See Comments)    Myalgia, fatigue, memory loss  . Amoxicillin Itching    REACTION: rash  . Atorvastatin Other (See Comments)    Myalgia, fatigue, memory loss  . Codeine Nausea And Vomiting   Outpatient Prescriptions Prior to Visit  Medication Sig Dispense Refill  . ASPIR-LOW 81 MG EC tablet TAKE 1 TABLET BY MOUTH DAILY 30 tablet 3  . BAYER CONTOUR TEST test strip USE AS INSTRUCTED 100 each 1  . bisoprolol-hydrochlorothiazide (ZIAC) 5-6.25 MG per tablet TAKE 1 TABLET BY MOUTH DAILY. 30 tablet 3  . INVOKANA 300 MG TABS tablet TAKE 1 TABLET BY MOUTH DAILY 30 tablet 3  . KRILL OIL PO Take 1 tablet by mouth daily.     . metFORMIN (GLUCOPHAGE) 1000 MG tablet 1 tab po bid and 1/2 tab po q noon 75 tablet 3  . omeprazole (PRILOSEC) 40 MG capsule Take 1 capsule (40 mg total) by mouth 2 (two) times daily. 60 capsule 2  . Probiotic Product (PROBIOTIC DAILY PO) Take 1 capsule by mouth daily.    . ranitidine (ZANTAC) 300 MG tablet Take 1 tablet (300 mg total) by mouth at bedtime as needed for heartburn. 30 tablet 5  . rosuvastatin (CRESTOR) 5 MG tablet Take 1 tablet (5 mg total) by mouth daily. 30 tablet 1   No facility-administered medications prior to visit.   Past Medical History  Diagnosis Date  . Diaphragmatic hernia without mention of obstruction or gangrene   . Nonspecific abnormal results of liver function study   . Plantar fascial fibromatosis   . Esophageal reflux   . Unspecified sleep apnea   . Hyperlipidemia   . Diabetes mellitus 52    type 2  . Hypertension 52  . Allergy     seasonal  .  Endometriosis   . Arthritis     knee-left  . Depression     job/stress related  . Pharyngitis 02/18/2012  . Nonalcoholic fatty liver disease 04/28/2010    Qualifier: Diagnosis of  By: Arnoldo Morale MD, [facility] 01/22/2013  . GERD (gastroesophageal reflux disease)   . Tubular adenoma of colon   . Hyperplastic colon polyp   . Sleep apnea     mouth piece per pt.  . Esophageal stricture    Past Surgical History  Procedure Laterality Date  . Appendectomy    . Benigh cyst in lung  2006    right  . Abdominal hysterectomy  1982    partial  . Tubal ligation    . Seba    . Hernia repair     History   Social History  . Marital Status: Married    Spouse Name: [name]  . Number of Children: 2  . Years of Education: N/A   Occupational History  . Designer, television/film set    Social History Main Topics  . Smoking status: Never Smoker   . Smokeless tobacco: Never Used  . Alcohol Use: No  . Drug Use: No  . Sexual Activity: Yes     Comment: lives with husband, no dietary restrictions just watching carbs, wears  seat belt   Other Topics Concern  . None   Social History Narrative   Family History  Problem Relation Age of Onset  . Breast cancer Mother   . Hypertension Mother   . Diabetes Mother     type 2  . Other Mother     esophagus surgery  . Heart disease Father   . Hyperlipidemia Father   . Hypertension Father   . Heart attack Father     MI at age 26  . Diabetes Brother     type 2  . Heart disease Maternal Grandmother   . Heart attack Maternal Grandfather   . Hypertension Maternal Grandfather   . Stroke Paternal Grandmother   . Hypertension Sister   . Diabetes Sister   . Hypertension Brother   . Alcohol abuse Maternal Aunt   . Colon cancer Neg Hx   . Stomach cancer Neg Hx   . Esophageal cancer Neg Hx       Physical Exam: General: Well developed , well nourished, no acute distress Head: Normocephalic and atraumatic Eyes:  sclerae anicteric,  EOMI Ears: Normal auditory acuity Mouth: No deformity or lesions Lungs: Clear throughout to auscultation Heart: Regular rate and rhythm; no murmurs, rubs or bruits Abdomen: Soft, non tender and non distended. No masses, hepatosplenomegaly or hernias noted. Normal Bowel sounds Musculoskeletal: Symmetrical with no gross deformities  Pulses:  Normal pulses noted Extremities: No clubbing, cyanosis, edema or deformities noted Neurological: Alert oriented x 4, grossly nonfocal Psychological:  Alert and cooperative. Normal mood and affect  Assessment and Recommendations:  1. GERD with a history of an esophageal stricture. Excellent response to recent dilation. Continue omeprazole 40 mg twice daily and ranitidine 300 mg at bedtime along with all standard antireflux measures.  2. Gas and bloating. Low gas diet. Gas-X or Phazyme 4 times a day when necessary.  3. Personal history of adenomatous colon polyps. Five-year interval surveillance colonoscopy recommended in April 2017.

## 2014-07-26 NOTE — Patient Instructions (Signed)
You have been a Low gas diet.   You can use over the counter Gas-X or Phazyme four times a day as needed for gas and bloating.  Thank you for choosing me and [facility].  Pricilla Riffle. Dagoberto Ligas., MD., [facility]

## 2014-08-16 ENCOUNTER — Other Ambulatory Visit: Payer: Self-pay | Admitting: Family Medicine

## 2014-08-16 ENCOUNTER — Other Ambulatory Visit: Payer: Self-pay | Admitting: Gastroenterology

## 2014-09-18 ENCOUNTER — Other Ambulatory Visit: Payer: Self-pay | Admitting: Family

## 2014-10-28 ENCOUNTER — Ambulatory Visit: Payer: 59 | Admitting: Family Medicine

## 2014-10-29 ENCOUNTER — Other Ambulatory Visit (INDEPENDENT_AMBULATORY_CARE_PROVIDER_SITE_OTHER): Payer: 59

## 2014-10-29 DIAGNOSIS — E785 Hyperlipidemia, unspecified: Secondary | ICD-10-CM

## 2014-10-29 DIAGNOSIS — E1169 Type 2 diabetes mellitus with other specified complication: Secondary | ICD-10-CM

## 2014-10-29 DIAGNOSIS — E669 Obesity, unspecified: Secondary | ICD-10-CM

## 2014-10-29 DIAGNOSIS — E119 Type 2 diabetes mellitus without complications: Secondary | ICD-10-CM

## 2014-10-29 DIAGNOSIS — I10 Essential (primary) hypertension: Secondary | ICD-10-CM | POA: Diagnosis not present

## 2014-10-29 LAB — LIPID PANEL
Cholesterol: 185 mg/dL (ref 0–200)
HDL: 38.4 mg/dL — AB (ref >39.00)
LDL CALC: 122 mg/dL — AB (ref 0–99)
NONHDL: 146.6
Total CHOL/HDL Ratio: 5
Triglycerides: 122 mg/dL (ref 0.0–149.0)
VLDL: 24.4 mg/dL (ref 0.0–40.0)

## 2014-10-29 LAB — COMPREHENSIVE METABOLIC PANEL
ALT: 54 U/L — AB (ref 0–35)
AST: 36 U/L (ref 0–37)
Albumin: 4 g/dL (ref 3.5–5.2)
Alkaline Phosphatase: 73 U/L (ref 39–117)
BUN: 15 mg/dL (ref 6–23)
CALCIUM: 9.6 mg/dL (ref 8.4–10.5)
CHLORIDE: 102 meq/L (ref 96–112)
CO2: 28 mEq/L (ref 19–32)
CREATININE: 0.83 mg/dL (ref 0.40–1.20)
GFR: 89.54 mL/min (ref >60.00)
Glucose, Bld: 121 mg/dL — ABNORMAL HIGH (ref 70–99)
Potassium: 4 mEq/L (ref 3.5–5.1)
Sodium: 138 mEq/L (ref 135–145)
Total Bilirubin: 0.4 mg/dL (ref 0.2–1.2)
Total Protein: 6.9 g/dL (ref 6.0–8.3)

## 2014-10-29 LAB — CBC
HCT: 40.3 % (ref 36.0–46.0)
Hemoglobin: 13.2 g/dL (ref 12.0–15.0)
MCHC: 32.9 g/dL (ref 30.0–36.0)
MCV: 79.4 fl (ref 78.0–100.0)
Platelets: 236 10*3/uL (ref 150.0–400.0)
RBC: 5.07 Mil/uL (ref 3.87–5.11)
RDW: 14.7 % (ref 11.5–15.5)
WBC: 5.6 10*3/uL (ref 4.0–10.5)

## 2014-10-29 LAB — TSH: TSH: 1.35 u[IU]/mL (ref 0.35–4.50)

## 2014-10-29 LAB — HEMOGLOBIN A1C: HEMOGLOBIN A1C: 7 % — AB (ref 4.6–6.5)

## 2014-11-01 ENCOUNTER — Encounter: Payer: Self-pay | Admitting: Family Medicine

## 2014-11-01 ENCOUNTER — Ambulatory Visit (INDEPENDENT_AMBULATORY_CARE_PROVIDER_SITE_OTHER): Payer: 59 | Admitting: Family Medicine

## 2014-11-01 VITALS — BP 108/68 | HR 74 | Temp 98.3°F | Ht 62.5 in | Wt 194.5 lb

## 2014-11-01 DIAGNOSIS — E669 Obesity, unspecified: Secondary | ICD-10-CM | POA: Diagnosis not present

## 2014-11-01 DIAGNOSIS — K219 Gastro-esophageal reflux disease without esophagitis: Secondary | ICD-10-CM

## 2014-11-01 DIAGNOSIS — R300 Dysuria: Secondary | ICD-10-CM | POA: Diagnosis not present

## 2014-11-01 DIAGNOSIS — K222 Esophageal obstruction: Secondary | ICD-10-CM

## 2014-11-01 DIAGNOSIS — E119 Type 2 diabetes mellitus without complications: Secondary | ICD-10-CM | POA: Diagnosis not present

## 2014-11-01 DIAGNOSIS — I10 Essential (primary) hypertension: Secondary | ICD-10-CM | POA: Diagnosis not present

## 2014-11-01 DIAGNOSIS — G4733 Obstructive sleep apnea (adult) (pediatric): Secondary | ICD-10-CM

## 2014-11-01 DIAGNOSIS — E782 Mixed hyperlipidemia: Secondary | ICD-10-CM

## 2014-11-01 DIAGNOSIS — E1169 Type 2 diabetes mellitus with other specified complication: Secondary | ICD-10-CM

## 2014-11-01 LAB — POCT URINALYSIS DIPSTICK
Bilirubin, UA: NEGATIVE
Blood, UA: NEGATIVE
Ketones, UA: NEGATIVE
Leukocytes, UA: NEGATIVE
NITRITE UA: NEGATIVE
Protein, UA: NEGATIVE
Spec Grav, UA: >=1.03
UROBILINOGEN UA: 4
pH, UA: 5.5

## 2014-11-01 MED ORDER — ROSUVASTATIN CALCIUM 5 MG PO TABS: 5.0000 mg | ORAL_TABLET | Freq: Every day | ORAL | Status: DC

## 2014-11-01 MED ORDER — METFORMIN HCL 1000 MG PO TABS: ORAL_TABLET | ORAL | Status: DC

## 2014-11-01 MED ORDER — ROSUVASTATIN CALCIUM 5 MG PO TABS: ORAL_TABLET | ORAL | Status: DC

## 2014-11-01 MED ORDER — CANAGLIFLOZIN 300 MG PO TABS: 300.0000 mg | ORAL_TABLET | Freq: Every day | ORAL | Status: DC

## 2014-11-01 MED ORDER — BISOPROLOL-HYDROCHLOROTHIAZIDE 5-6.25 MG PO TABS: 1.0000 | ORAL_TABLET | Freq: Every day | ORAL | Status: DC

## 2014-11-01 NOTE — Patient Instructions (Signed)
Cranberry tabs and increased fluids  Cholesterol Cholesterol is a white, waxy, fat-like substance needed by your body in small amounts. The liver makes all the cholesterol you need. Cholesterol is carried from the liver by the blood through the blood vessels. Deposits of cholesterol (plaque) may build up on blood vessel walls. These make the arteries narrower and stiffer. Cholesterol plaques increase the risk for heart attack and stroke.  You cannot feel your cholesterol level even if it is very high. The only way to know it is high is with a blood test. Once you know your cholesterol levels, you should keep a record of the test results. Work with your health care provider to keep your levels in the desired range.  WHAT DO THE RESULTS MEAN?  Total cholesterol is a rough measure of all the cholesterol in your blood.   LDL is the so-called bad cholesterol. This is the type that deposits cholesterol in the walls of the arteries. You want this level to be low.   HDL is the good cholesterol because it cleans the arteries and carries the LDL away. You want this level to be high.  Triglycerides are fat that the body can either burn for energy or store. High levels are closely linked to heart disease.  WHAT ARE THE DESIRED LEVELS OF CHOLESTEROL?  Total cholesterol below 200.   LDL below 100 for people at risk, below 70 for those at very high risk.   HDL above 50 is good, above 60 is best.   Triglycerides below 150.  HOW CAN I LOWER MY CHOLESTEROL?  Diet. Follow your diet programs as directed by your health care provider.   Choose fish or white meat chicken and [place], roasted or baked. Limit fatty cuts of red meat, fried foods, and processed meats, such as sausage and lunch meats.   Eat lots of fresh fruits and vegetables.  Choose whole grains, beans, pasta, potatoes, and cereals.   Use only small amounts of olive, corn, or canola oils.   Avoid butter, mayonnaise, shortening, or  palm kernel oils.  Avoid foods with trans fats.   Drink skim or nonfat milk and eat low-fat or nonfat yogurt and cheeses. Avoid whole milk, cream, ice cream, egg yolks, and full-fat cheeses.   Healthy desserts include angel food cake, ginger snaps, animal crackers, hard candy, popsicles, and low-fat or nonfat frozen yogurt. Avoid pastries, cakes, pies, and cookies.   Exercise. Follow your exercise programs as directed by your health care provider.   A regular program helps decrease LDL and raise HDL.   A regular program helps with weight control.   Do things that increase your activity level like gardening, walking, or taking the stairs. Ask your health care provider about how you can be more active in your daily life.   Medicine. Take medicine only as directed by your health care provider.   Medicine may be prescribed by your health care provider to help lower cholesterol and decrease the risk for heart disease.   If you have several risk factors, you may need medicine even if your levels are normal. Document Released: 02/23/2001 Document Revised: 10/15/2013 Document Reviewed: 03/14/2013 [facility] Patient Information 2015 [place], [place]. This information is not intended to replace advice given to you by your health care provider. Make sure you discuss any questions you have with your health care provider.

## 2014-11-01 NOTE — Progress Notes (Signed)
Pre visit review using our clinic review tool, if applicable. No additional management support is needed unless otherwise documented below in the visit note. 

## 2014-11-08 LAB — CULTURE, URINE COMPREHENSIVE
Colony Count: NO GROWTH
ORGANISM ID, BACTERIA: NO GROWTH

## 2014-11-10 ENCOUNTER — Encounter: Payer: Self-pay | Admitting: Family Medicine

## 2014-11-10 NOTE — Assessment & Plan Note (Signed)
Tolerating statin, encouraged heart healthy diet, avoid trans fats, minimize simple carbs and saturated fats. Increase exercise as tolerated 

## 2014-11-10 NOTE — Progress Notes (Signed)
[name]  [id number] [birth year] 11/10/2014      Progress Note-Follow Up  Subjective  Chief Complaint  Chief Complaint  Patient presents with  . Follow-up    HPI  Patient is a 64 y.o. female in today for routine medical care. Patient is in today reporting she is doing much better. She is using a dental appliance for her sleep apnea and resting much better. As a result she has more energy and has begun to exercise via walking routinely. Has been seen by gastroenterology and her dysphagia is greatly improved. She does note some very mild dysuria the last day or so but no frequency, urgency, fevers or back pain. No other acute complaints or recent illness. Denies CP/palp/SOB/HA/congestion/fevers/GI c/o. Taking meds as prescribed  Past Medical History  Diagnosis Date  . Diaphragmatic hernia without mention of obstruction or gangrene   . Nonspecific abnormal results of liver function study   . Plantar fascial fibromatosis   . Esophageal reflux   . Unspecified sleep apnea   . Hyperlipidemia   . Diabetes mellitus 52    type 2  . Hypertension 52  . Allergy     seasonal  . Endometriosis   . Arthritis     knee-left  . Depression     job/stress related  . Pharyngitis 02/18/2012  . Nonalcoholic fatty liver disease 04/28/2010    Qualifier: Diagnosis of  By: Arnoldo Morale MD, Bal[facility] 01/22/2013  . GERD (gastroesophageal reflux disease)   . Tubular adenoma of colon   . Hyperplastic colon polyp   . Sleep apnea     mouth piece per pt.  . Esophageal stricture     Past Surgical History  Procedure Laterality Date  . Appendectomy    . Benigh cyst in lung  2006    right  . Abdominal hysterectomy  1982    partial  . Tubal ligation    . Seba    . Hernia repair      Family History  Problem Relation Age of Onset  . Breast cancer Mother   . Hypertension Mother   . Diabetes Mother     type 2  . Other Mother     esophagus surgery  . Heart disease  Father   . Hyperlipidemia Father   . Hypertension Father   . Heart attack Father     MI at age 15  . Diabetes Brother     type 2  . Heart disease Maternal Grandmother   . Heart attack Maternal Grandfather   . Hypertension Maternal Grandfather   . Stroke Paternal Grandmother   . Hypertension Sister   . Diabetes Sister   . Hypertension Brother   . Alcohol abuse Maternal Aunt   . Colon cancer Neg Hx   . Stomach cancer Neg Hx   . Esophageal cancer Neg Hx     History   Social History  . Marital Status: Married    Spouse Name: [name]  . Number of Children: 2  . Years of Education: N/A   Occupational History  . Designer, television/film set    Social History Main Topics  . Smoking status: Never Smoker   . Smokeless tobacco: Never Used  . Alcohol Use: No  . Drug Use: No  . Sexual Activity: Yes     Comment: lives with husband, no dietary restrictions just watching carbs, wears seat belt   Other Topics Concern  . Not on file   Social History  Narrative    Current Outpatient Prescriptions on File Prior to Visit  Medication Sig Dispense Refill  . ASPIR-LOW 81 MG EC tablet TAKE 1 TABLET BY MOUTH DAILY 30 tablet 3  . BAYER CONTOUR TEST test strip USE AS INSTRUCTED 100 each 1  . KRILL OIL PO Take 1 tablet by mouth daily.     Marland Kitchen omeprazole (PRILOSEC) 40 MG capsule TAKE 1 CAPSULE (40 MG TOTAL) BY MOUTH 2 TIMES DAILY. 60 capsule 10  . Probiotic Product (PROBIOTIC DAILY PO) Take 1 capsule by mouth daily.    . ranitidine (ZANTAC) 300 MG tablet Take 1 tablet (300 mg total) by mouth at bedtime as needed for heartburn. 30 tablet 5   No current facility-administered medications on file prior to visit.    Allergies  Allergen Reactions  . Pravastatin Other (See Comments)    Myalgia, fatigue, memory loss  . Amoxicillin Itching    REACTION: rash  . Atorvastatin Other (See Comments)    Myalgia, fatigue, memory loss  . Codeine Nausea And Vomiting    Review of Systems  Review of Systems    Constitutional: Negative for fever and malaise/fatigue.  HENT: Negative for congestion.   Eyes: Negative for discharge.  Respiratory: Negative for shortness of breath.   Cardiovascular: Negative for chest pain, palpitations and leg swelling.  Gastrointestinal: Negative for nausea, abdominal pain and diarrhea.  Genitourinary: Negative for dysuria.  Musculoskeletal: Negative for falls.  Skin: Negative for rash.  Neurological: Negative for loss of consciousness and headaches.  Endo/Heme/Allergies: Negative for polydipsia.  Psychiatric/Behavioral: Negative for depression and suicidal ideas. The patient is not nervous/anxious and does not have insomnia.     Objective  BP 108/68 mmHg  Pulse 74  Temp(Src) 98.3 F (36.8 C) (Oral)  Ht 5' 2.5" (1.588 m)  Wt 194 lb 8 oz (88.225 kg)  BMI 34.99 kg/m2  SpO2 97%  Physical Exam  Physical Exam  Constitutional: She is oriented to person, place, and time and well-developed, well-nourished, and in no distress. No distress.  HENT:  Head: Normocephalic and atraumatic.  Eyes: Conjunctivae are normal.  Neck: Neck supple. No thyromegaly present.  Cardiovascular: Normal rate, regular rhythm and normal heart sounds.   No murmur heard. Pulmonary/Chest: Effort normal and breath sounds normal. She has no wheezes.  Abdominal: She exhibits no distension and no mass.  Musculoskeletal: She exhibits no edema.  Lymphadenopathy:    She has no cervical adenopathy.  Neurological: She is alert and oriented to person, place, and time.  Skin: Skin is warm and dry. No rash noted. She is not diaphoretic.  Psychiatric: Memory, affect and judgment normal.    Lab Results  Component Value Date   TSH 1.35 10/29/2014   Lab Results  Component Value Date   WBC 5.6 10/29/2014   HGB 13.2 10/29/2014   HCT 40.3 10/29/2014   MCV 79.4 10/29/2014   PLT 236.0 10/29/2014   Lab Results  Component Value Date   CREATININE 0.83 10/29/2014   BUN 15 10/29/2014   NA 138  10/29/2014   K 4.0 10/29/2014   CL 102 10/29/2014   CO2 28 10/29/2014   Lab Results  Component Value Date   ALT 54* 10/29/2014   AST 36 10/29/2014   ALKPHOS 73 10/29/2014   BILITOT 0.4 10/29/2014   Lab Results  Component Value Date   CHOL 185 10/29/2014   Lab Results  Component Value Date   HDL 38.40* 10/29/2014   Lab Results  Component Value Date  [place] 122* 10/29/2014   Lab Results  Component Value Date   TRIG 122.0 10/29/2014   Lab Results  Component Value Date   CHOLHDL 5 10/29/2014     Assessment & Plan  Essential hypertension Well controlled, no changes to meds. Encouraged heart healthy diet such as the DASH diet and exercise as tolerated.    ESOPHAGEAL STRICTURE Is feeling much better since having her treatment.   GERD Avoid offending foods, take probiotics. Do not eat large meals in late evening and consider raising head of bed. Continue current meds   Diabetes mellitus type 2 in obese [id number] acceptable, minimize simple carbs. Increase exercise as tolerated. Continue current meds   Hyperlipidemia, mixed Tolerating statin, encouraged heart healthy diet, avoid trans fats, minimize simple carbs and saturated fats. Increase exercise as tolerated   OSA (obstructive sleep apnea) Has done well with dental appliance from orthodontics, Dr Ron Parker

## 2014-11-10 NOTE — Assessment & Plan Note (Signed)
Is feeling much better since having her treatment.

## 2014-11-10 NOTE — Assessment & Plan Note (Signed)
Well controlled, no changes to meds. Encouraged heart healthy diet such as the DASH diet and exercise as tolerated.  °

## 2014-11-10 NOTE — Assessment & Plan Note (Signed)
Has done well with dental appliance from orthodontics, Dr Ron Parker

## 2014-11-10 NOTE — Assessment & Plan Note (Signed)
hgba1c acceptable, minimize simple carbs. Increase exercise as tolerated. Continue current meds 

## 2014-11-10 NOTE — Assessment & Plan Note (Signed)
Avoid offending foods, take probiotics. Do not eat large meals in late evening and consider raising head of bed. Continue current meds 

## 2014-12-09 ENCOUNTER — Other Ambulatory Visit: Payer: Self-pay

## 2014-12-17 ENCOUNTER — Other Ambulatory Visit: Payer: Self-pay | Admitting: Family Medicine

## 2015-01-16 ENCOUNTER — Other Ambulatory Visit: Payer: Self-pay | Admitting: Family Medicine

## 2015-01-30 LAB — HM MAMMOGRAPHY

## 2015-02-03 ENCOUNTER — Encounter: Payer: Self-pay | Admitting: Family Medicine

## 2015-02-13 ENCOUNTER — Encounter: Payer: Self-pay | Admitting: Family Medicine

## 2015-02-27 ENCOUNTER — Other Ambulatory Visit: Payer: 59

## 2015-03-06 ENCOUNTER — Ambulatory Visit: Payer: 59 | Admitting: Family Medicine

## 2015-03-12 LAB — HM DIABETES EYE EXAM

## 2015-03-21 ENCOUNTER — Telehealth: Payer: Self-pay | Admitting: Family Medicine

## 2015-03-21 NOTE — Telephone Encounter (Signed)
Called the patient informed we have no samples.  She stated she would be covered in November and would have to wait until then.

## 2015-03-21 NOTE — Telephone Encounter (Signed)
Relation to QM:GNOI Call back number:[phone]   Reason for call:  Patient would like to know if PCP has any samples of canagliflozin (INVOKANA) 300 MG TABS tablet

## 2015-03-24 ENCOUNTER — Encounter: Payer: Self-pay | Admitting: Family Medicine

## 2015-06-20 ENCOUNTER — Telehealth: Payer: Self-pay | Admitting: Family Medicine

## 2015-06-20 NOTE — Telephone Encounter (Signed)
Called left message to call back 

## 2015-06-20 NOTE — Telephone Encounter (Signed)
We now try not to prescribe antibiotics as much because so many of these congestion illnesses are viruses and we are now seeing lots of resistence and complications from the antibiotics. Recommend Mucinex (plain) twice daily x 10 days. Encouraged increased rest and hydration, add probiotics such as Hardin Negus Colon health, zinc such as Coldeze or Xicam. Treat fevers as needed. Also consider adding Vitamin C 1000 mg daily, if no improvement or symptoms worsen come in.

## 2015-06-20 NOTE — Telephone Encounter (Signed)
Caller name: Montia   Relationship to patient: Self  Can be reached: [phone]  Pharmacy: [facility], [place] [address]  Reason for call: Pt says that she is experiencing some congestion. Pt says that before she was able to get the Z-Pack, Informed pt that an appt is needed. Pt  would like to know if she can receive another one instead of coming in to an appt.    Please advise

## 2015-06-20 NOTE — Telephone Encounter (Signed)
Called the patient informed of all PCP instructions. 

## 2015-07-08 MED FILL — metFORMIN HCL 1000 MG TABS: 1000 | 30 days supply | Qty: 75 | Fill #6

## 2015-07-16 ENCOUNTER — Encounter: Payer: Self-pay | Admitting: Family Medicine

## 2015-07-22 MED FILL — BISOPROLOL-HCTZ 5-6.25 MG T: 5-6.25 | 90 days supply | Qty: 90 | Fill #1

## 2015-08-11 LAB — HM DIABETES EYE EXAM

## 2015-08-12 ENCOUNTER — Encounter: Payer: Self-pay | Admitting: Gastroenterology

## 2015-08-12 ENCOUNTER — Encounter: Payer: Self-pay | Admitting: Family Medicine

## 2015-08-15 ENCOUNTER — Other Ambulatory Visit: Payer: Self-pay | Admitting: Family Medicine

## 2015-08-15 MED FILL — metFORMIN HCL 1000 MG TABS: 1000 | 30 days supply | Qty: 75 | Fill #0

## 2015-08-28 ENCOUNTER — Other Ambulatory Visit (INDEPENDENT_AMBULATORY_CARE_PROVIDER_SITE_OTHER): Payer: BLUE CROSS/BLUE SHIELD

## 2015-08-28 DIAGNOSIS — E669 Obesity, unspecified: Secondary | ICD-10-CM | POA: Diagnosis not present

## 2015-08-28 DIAGNOSIS — E119 Type 2 diabetes mellitus without complications: Secondary | ICD-10-CM

## 2015-08-28 DIAGNOSIS — E1169 Type 2 diabetes mellitus with other specified complication: Secondary | ICD-10-CM

## 2015-08-28 LAB — COMPREHENSIVE METABOLIC PANEL
ALT: 59 U/L — ABNORMAL HIGH (ref 0–35)
AST: 35 U/L (ref 0–37)
Albumin: 3.9 g/dL (ref 3.5–5.2)
Alkaline Phosphatase: 69 U/L (ref 39–117)
BUN: 16 mg/dL (ref 6–23)
CALCIUM: 9.6 mg/dL (ref 8.4–10.5)
CHLORIDE: 101 meq/L (ref 96–112)
CO2: 29 meq/L (ref 19–32)
Creatinine, Ser: 0.85 mg/dL (ref 0.40–1.20)
GFR: 86.88 mL/min (ref >60.00)
Glucose, Bld: 182 mg/dL — ABNORMAL HIGH (ref 70–99)
POTASSIUM: 3.9 meq/L (ref 3.5–5.1)
Sodium: 138 mEq/L (ref 135–145)
Total Bilirubin: 0.5 mg/dL (ref 0.2–1.2)
Total Protein: 7.1 g/dL (ref 6.0–8.3)

## 2015-08-28 LAB — CBC
HEMATOCRIT: 39.3 % (ref 36.0–46.0)
HEMOGLOBIN: 13 g/dL (ref 12.0–15.0)
MCHC: 33.1 g/dL (ref 30.0–36.0)
MCV: 82.1 fl (ref 78.0–100.0)
PLATELETS: 251 10*3/uL (ref 150.0–400.0)
RBC: 4.78 Mil/uL (ref 3.87–5.11)
RDW: 14 % (ref 11.5–15.5)
WBC: 5.9 10*3/uL (ref 4.0–10.5)

## 2015-08-28 LAB — LIPID PANEL
CHOLESTEROL: 196 mg/dL (ref 0–200)
HDL: 37.4 mg/dL — ABNORMAL LOW (ref >39.00)
LDL CALC: 126 mg/dL — AB (ref 0–99)
NonHDL: 158.65
Total CHOL/HDL Ratio: 5
Triglycerides: 161 mg/dL — ABNORMAL HIGH (ref 0.0–149.0)
VLDL: 32.2 mg/dL (ref 0.0–40.0)

## 2015-08-28 LAB — HEMOGLOBIN A1C: Hgb A1c MFr Bld: 7.9 % — ABNORMAL HIGH (ref 4.6–6.5)

## 2015-08-28 LAB — TSH: TSH: 1.07 u[IU]/mL (ref 0.35–4.50)

## 2015-09-04 ENCOUNTER — Encounter: Payer: Self-pay | Admitting: Family Medicine

## 2015-09-04 ENCOUNTER — Ambulatory Visit (INDEPENDENT_AMBULATORY_CARE_PROVIDER_SITE_OTHER): Payer: BLUE CROSS/BLUE SHIELD | Admitting: Family Medicine

## 2015-09-04 VITALS — BP 120/80 | HR 76 | Temp 98.1°F | Ht 63.0 in | Wt 194.5 lb

## 2015-09-04 DIAGNOSIS — M546 Pain in thoracic spine: Secondary | ICD-10-CM

## 2015-09-04 DIAGNOSIS — I10 Essential (primary) hypertension: Secondary | ICD-10-CM

## 2015-09-04 DIAGNOSIS — K760 Fatty (change of) liver, not elsewhere classified: Secondary | ICD-10-CM | POA: Diagnosis not present

## 2015-09-04 DIAGNOSIS — Z23 Encounter for immunization: Secondary | ICD-10-CM | POA: Diagnosis not present

## 2015-09-04 DIAGNOSIS — E1169 Type 2 diabetes mellitus with other specified complication: Secondary | ICD-10-CM

## 2015-09-04 DIAGNOSIS — G4733 Obstructive sleep apnea (adult) (pediatric): Secondary | ICD-10-CM

## 2015-09-04 DIAGNOSIS — E119 Type 2 diabetes mellitus without complications: Secondary | ICD-10-CM | POA: Diagnosis not present

## 2015-09-04 DIAGNOSIS — E782 Mixed hyperlipidemia: Secondary | ICD-10-CM

## 2015-09-04 DIAGNOSIS — E669 Obesity, unspecified: Secondary | ICD-10-CM

## 2015-09-04 DIAGNOSIS — K219 Gastro-esophageal reflux disease without esophagitis: Secondary | ICD-10-CM

## 2015-09-04 MED ORDER — CYCLOBENZAPRINE HCL 10 MG PO TABS
10.0000 mg | ORAL_TABLET | Freq: Every evening | ORAL | Status: DC | PRN
Start: 2015-09-04 — End: 2016-11-15

## 2015-09-04 MED ORDER — CANAGLIFLOZIN 300 MG PO TABS: 300.0000 mg | ORAL_TABLET | Freq: Every day | ORAL | Status: DC

## 2015-09-04 MED FILL — INVOKANA 300 MG TABLET: 300 | 90 days supply | Qty: 90 | Fill #0

## 2015-09-04 MED FILL — CYCLOBENZAPRINE 10 MG TAB: 10 | 30 days supply | Qty: 30 | Fill #0

## 2015-09-04 NOTE — Progress Notes (Signed)
Pre visit review using our clinic review tool, if applicable. No additional management support is needed unless otherwise documented below in the visit note. 

## 2015-09-04 NOTE — Patient Instructions (Addendum)
Lidocaine patches to affected area at bedtime  [place] at [email or web address]  Vitamin D, Curcumen/Turmeric caps, krill oil, Probiotic 10 strain version  Back Pain, Adult Back pain is very common in adults.The cause of back pain is rarely dangerous and the pain often gets better over time.The cause of your back pain may not be known. Some common causes of back pain include:  Strain of the muscles or ligaments supporting the spine.  Wear and tear (degeneration) of the spinal disks.  Arthritis.  Direct injury to the back. For many people, back pain may return. Since back pain is rarely dangerous, most people can learn to manage this condition on their own. HOME CARE INSTRUCTIONS Watch your back pain for any changes. The following actions may help to lessen any discomfort you are feeling:  Remain active. It is stressful on your back to sit or stand in one place for long periods of time. Do not sit, drive, or stand in one place for more than 30 minutes at a time. Take short walks on even surfaces as soon as you are able.Try to increase the length of time you walk each day.  Exercise regularly as directed by your health care provider. Exercise helps your back heal faster. It also helps avoid future injury by keeping your muscles strong and flexible.  Do not stay in bed.Resting more than 1-2 days can delay your recovery.  Pay attention to your body when you bend and lift. The most comfortable positions are those that put less stress on your recovering back. Always use proper lifting techniques, including:  Bending your knees.  Keeping the load close to your body.  Avoiding twisting.  Find a comfortable position to sleep. Use a firm mattress and lie on your side with your knees slightly bent. If you lie on your back, put a pillow under your knees.  Avoid feeling anxious or stressed.Stress increases muscle tension and can worsen back pain.It is important to recognize when you are  anxious or stressed and learn ways to manage it, such as with exercise.  Take medicines only as directed by your health care provider. Over-the-counter medicines to reduce pain and inflammation are often the most helpful.Your health care provider may prescribe muscle relaxant drugs.These medicines help dull your pain so you can more quickly return to your normal activities and healthy exercise.  Apply ice to the injured area:  Put ice in a plastic bag.  Place a towel between your skin and the bag.  Leave the ice on for 20 minutes, 2-3 times a day for the first 2-3 days. After that, ice and heat may be alternated to reduce pain and spasms.  Maintain a healthy weight. Excess weight puts extra stress on your back and makes it difficult to maintain good posture. SEEK MEDICAL CARE IF:  You have pain that is not relieved with rest or medicine.  You have increasing pain going down into the legs or buttocks.  You have pain that does not improve in one week.  You have night pain.  You lose weight.  You have a fever or chills. SEEK IMMEDIATE MEDICAL CARE IF:   You develop new bowel or bladder control problems.  You have unusual weakness or numbness in your arms or legs.  You develop nausea or vomiting.  You develop abdominal pain.  You feel faint.   This information is not intended to replace advice given to you by your health care provider. Make sure you discuss any questions  you have with your health care provider.   Document Released: 05/31/2005 Document Revised: 06/21/2014 Document Reviewed: 10/02/2013 Elsevier Interactive Patient Education [employer].

## 2015-09-05 ENCOUNTER — Other Ambulatory Visit (INDEPENDENT_AMBULATORY_CARE_PROVIDER_SITE_OTHER): Payer: BLUE CROSS/BLUE SHIELD

## 2015-09-05 DIAGNOSIS — E669 Obesity, unspecified: Secondary | ICD-10-CM

## 2015-09-05 DIAGNOSIS — E119 Type 2 diabetes mellitus without complications: Secondary | ICD-10-CM | POA: Diagnosis not present

## 2015-09-05 DIAGNOSIS — E1169 Type 2 diabetes mellitus with other specified complication: Secondary | ICD-10-CM

## 2015-09-05 LAB — MICROALBUMIN / CREATININE URINE RATIO
Creatinine,U: 42.2 mg/dL
MICROALB/CREAT RATIO: 1.7 mg/g (ref 0.0–30.0)
Microalb, Ur: <0.7 mg/dL (ref 0.0–1.9)

## 2015-09-07 NOTE — Assessment & Plan Note (Signed)
Encouraged moist heat and gentle stretching as tolerated. May try NSAIDs and prescription meds as directed and report if symptoms worsen or seek immediate care. Given muscle relaxer, encouraged topical lidocaine patches

## 2015-09-07 NOTE — Assessment & Plan Note (Signed)
Uses treatment regularly

## 2015-09-07 NOTE — Progress Notes (Signed)
Patient ID: [name], female   DOB: 11/28/1950, 65 y.o.   MRN: [id number]   Subjective:    Patient ID: [name], female    DOB: 05-31-1951, 65 y.o.   MRN: [id number]  Chief Complaint  Patient presents with  . Follow-up    HPI Patient is in today for follow up. Did not have insurance for a while so is here to get restarted on therapies. She has been off of Invokana for months. She is having cataract on right removed tomorrow and has recently had some teeth pulled. Is mostly noting mid back pain that wraps around to her front b/l most notably when she lies down. No falls or incontinence. Denies CP/palp/SOB/HA/congestion/fevers/GI or GU c/o. Taking meds as prescribed  Past Medical History  Diagnosis Date  . Diaphragmatic hernia without mention of obstruction or gangrene   . Nonspecific abnormal results of liver function study   . Plantar fascial fibromatosis   . Esophageal reflux   . Unspecified sleep apnea   . Hyperlipidemia   . Diabetes mellitus 52    type 2  . Hypertension 52  . Allergy     seasonal  . Endometriosis   . Arthritis     knee-left  . Depression     job/stress related  . Pharyngitis 02/18/2012  . Nonalcoholic fatty liver disease 04/28/2010    Qualifier: Diagnosis of  By: Arnoldo Morale MD, Bal[facility] 01/22/2013  . GERD (gastroesophageal reflux disease)   . Tubular adenoma of colon   . Hyperplastic colon polyp   . Sleep apnea     mouth piece per pt.  . Esophageal stricture     Past Surgical History  Procedure Laterality Date  . Appendectomy    . Benigh cyst in lung  2006    right  . Abdominal hysterectomy  1982    partial  . Tubal ligation    . Seba    . Hernia repair      Family History  Problem Relation Age of Onset  . Breast cancer Mother   . Hypertension Mother   . Diabetes Mother     type 2  . Other Mother     esophagus surgery  . Heart disease Father   . Hyperlipidemia Father   . Hypertension Father   .  Heart attack Father     MI at age 59  . Diabetes Brother     type 2  . Heart disease Maternal Grandmother   . Heart attack Maternal Grandfather   . Hypertension Maternal Grandfather   . Stroke Paternal Grandmother   . Hypertension Sister   . Diabetes Sister   . Hypertension Brother   . Alcohol abuse Maternal Aunt   . Colon cancer Neg Hx   . Stomach cancer Neg Hx   . Esophageal cancer Neg Hx     Social History   Social History  . Marital Status: Married    Spouse Name: [name]  . Number of Children: 2  . Years of Education: N/A   Occupational History  . Designer, television/film set    Social History Main Topics  . Smoking status: Never Smoker   . Smokeless tobacco: Never Used  . Alcohol Use: No  . Drug Use: No  . Sexual Activity: Yes     Comment: lives with husband, no dietary restrictions just watching carbs, wears seat belt   Other Topics Concern  . Not on file   Social History Narrative  Outpatient Prescriptions Prior to Visit  Medication Sig Dispense Refill  . ASPIR-LOW 81 MG EC tablet TAKE 1 TABLET BY MOUTH DAILY 30 tablet 3  . BAYER CONTOUR TEST test strip USE AS INSTRUCTED 100 each 1  . bisoprolol-hydrochlorothiazide (ZIAC) 5-6.25 MG per tablet Take 1 tablet by mouth daily. 90 tablet 1  . fluticasone (FLONASE) 50 MCG/ACT nasal spray Place 1 spray into both nostrils daily.    Marland Kitchen KRILL OIL PO Take 1 tablet by mouth daily.     . metFORMIN (GLUCOPHAGE) 1000 MG tablet TAKE 1 TABLET BY MOUTH TWICE A DAY AND 1/2 AT NOON 75 tablet 2  . omeprazole (PRILOSEC) 40 MG capsule TAKE 1 CAPSULE (40 MG TOTAL) BY MOUTH 2 TIMES DAILY. 60 capsule 10  . Probiotic Product (PROBIOTIC DAILY PO) Take 1 capsule by mouth daily.    . rosuvastatin (CRESTOR) 5 MG tablet 1/2 TABLET EVERY OTHER DAY (Patient not taking: Reported on 09/04/2015) 45 tablet 1  . bisoprolol-hydrochlorothiazide (ZIAC) 5-6.25 MG per tablet TAKE 1 TABLET BY MOUTH DAILY. 30 tablet 3  . canagliflozin (INVOKANA) 300 MG TABS tablet  Take 300 mg by mouth daily. (Patient not taking: Reported on 09/04/2015) 90 tablet 1  . ranitidine (ZANTAC) 300 MG tablet Take 1 tablet (300 mg total) by mouth at bedtime as needed for heartburn. (Patient not taking: Reported on 09/04/2015) 30 tablet 5   No facility-administered medications prior to visit.    Allergies  Allergen Reactions  . Pravastatin Other (See Comments)    Myalgia, fatigue, memory loss  . Amoxicillin Itching    REACTION: rash  . Atorvastatin Other (See Comments)    Myalgia, fatigue, memory loss  . Codeine Nausea And Vomiting    Review of Systems  Constitutional: Negative for fever and malaise/fatigue.  HENT: Negative for congestion.   Eyes: Negative for blurred vision.  Respiratory: Negative for shortness of breath.   Cardiovascular: Negative for chest pain, palpitations and leg swelling.  Gastrointestinal: Negative for nausea, abdominal pain and blood in stool.  Genitourinary: Negative for dysuria and frequency.  Musculoskeletal: Positive for back pain. Negative for falls.  Skin: Negative for rash.  Neurological: Negative for dizziness, loss of consciousness and headaches.  Endo/Heme/Allergies: Negative for environmental allergies.  Psychiatric/Behavioral: Negative for depression. The patient is not nervous/anxious.        Objective:    Physical Exam  Constitutional: She is oriented to person, place, and time. She appears well-developed and well-nourished. No distress.  HENT:  Head: Normocephalic and atraumatic.  Nose: Nose normal.  Eyes: Right eye exhibits no discharge. Left eye exhibits no discharge.  Neck: Normal range of motion. Neck supple.  Cardiovascular: Normal rate and regular rhythm.   No murmur heard. Pulmonary/Chest: Effort normal and breath sounds normal.  Abdominal: Soft. Bowel sounds are normal. There is no tenderness.  Musculoskeletal: She exhibits no edema.  Neurological: She is alert and oriented to person, place, and time.  Skin:  Skin is warm and dry.  Psychiatric: She has a normal mood and affect.  Nursing note and vitals reviewed.   BP 120/80 mmHg  Pulse 76  Temp(Src) 98.1 F (36.7 C) (Oral)  Ht 5\' 3"  (1.6 m)  Wt 194 lb 8 oz (88.225 kg)  BMI 34.46 kg/m2  SpO2 96% Wt Readings from Last 3 Encounters:  09/04/15 194 lb 8 oz (88.225 kg)  11/01/14 194 lb 8 oz (88.225 kg)  07/26/14 190 lb 2 oz (86.24 kg)     Lab Results  Component  Value Date   WBC 5.9 08/28/2015   HGB 13.0 08/28/2015   HCT 39.3 08/28/2015   PLT 251.0 08/28/2015   GLUCOSE 182* 08/28/2015   CHOL 196 08/28/2015   TRIG 161.0* 08/28/2015   HDL 37.40* 08/28/2015   LDLCALC 126* 08/28/2015   ALT 59* 08/28/2015   AST 35 08/28/2015   NA 138 08/28/2015   K 3.9 08/28/2015   CL 101 08/28/2015   CREATININE 0.85 08/28/2015   BUN 16 08/28/2015   CO2 29 08/28/2015   TSH 1.07 08/28/2015   HGBA1C 7.9* 08/28/2015   MICROALBUR <0.7 09/05/2015    Lab Results  Component Value Date   TSH 1.07 08/28/2015   Lab Results  Component Value Date   WBC 5.9 08/28/2015   HGB 13.0 08/28/2015   HCT 39.3 08/28/2015   MCV 82.1 08/28/2015   PLT 251.0 08/28/2015   Lab Results  Component Value Date   NA 138 08/28/2015   K 3.9 08/28/2015   CO2 29 08/28/2015   GLUCOSE 182* 08/28/2015   BUN 16 08/28/2015   CREATININE 0.85 08/28/2015   BILITOT 0.5 08/28/2015   ALKPHOS 69 08/28/2015   AST 35 08/28/2015   ALT 59* 08/28/2015   PROT 7.1 08/28/2015   ALBUMIN 3.9 08/28/2015   CALCIUM 9.6 08/28/2015   ANIONGAP 16* 12/19/2013   GFR 86.88 08/28/2015   Lab Results  Component Value Date   CHOL 196 08/28/2015   Lab Results  Component Value Date   HDL 37.40* 08/28/2015   Lab Results  Component Value Date   LDLCALC 126* 08/28/2015   Lab Results  Component Value Date   TRIG 161.0* 08/28/2015   Lab Results  Component Value Date   CHOLHDL 5 08/28/2015   Lab Results  Component Value Date   HGBA1C 7.9* 08/28/2015       Assessment & Plan:    Problem List Items Addressed This Visit    Diabetes mellitus type 2 in obese ([place])    didi well on Invokana but lost insurance and has not taken since Sept. Restart today and minimize simple carbs      Relevant Medications   canagliflozin (INVOKANA) 300 MG TABS tablet   Other Relevant Orders   Hemoglobin A1c   TSH   CBC   Lipid panel   Comprehensive metabolic panel   Microalbumin / creatinine urine ratio   Varicella zoster antibody, IgG   Microalbumin / creatinine urine ratio (Completed)   Essential hypertension    Well controlled, no changes to meds. Encouraged heart healthy diet such as the DASH diet and exercise as tolerated.       Relevant Orders   Hemoglobin A1c   TSH   CBC   Lipid panel   Comprehensive metabolic panel   Microalbumin / creatinine urine ratio   Varicella zoster antibody, IgG   GERD    Avoid offending foods, take probiotics. Do not eat large meals in late evening and consider raising head of bed.       Hyperlipidemia, mixed    Tolerating low dose crestor, continue      Relevant Orders   Hemoglobin A1c   TSH   CBC   Lipid panel   Comprehensive metabolic panel   Microalbumin / creatinine urine ratio   Varicella zoster antibody, IgG   Nonalcoholic fatty liver disease   Relevant Orders   Hemoglobin A1c   TSH   CBC   Lipid panel   Comprehensive metabolic panel   Microalbumin / creatinine  urine ratio   Varicella zoster antibody, IgG   OSA (obstructive sleep apnea)    Uses treatment regularly      Thoracic back pain - Primary    Encouraged moist heat and gentle stretching as tolerated. May try NSAIDs and prescription meds as directed and report if symptoms worsen or seek immediate care. Given muscle relaxer, encouraged topical lidocaine patches      Relevant Medications   cyclobenzaprine (FLEXERIL) 10 MG tablet   Other Relevant Orders   DG Thoracic Spine W/Swimmers   Hemoglobin A1c   TSH   CBC   Lipid panel   Comprehensive  metabolic panel   Microalbumin / creatinine urine ratio   Varicella zoster antibody, IgG    Other Visit Diagnoses    Need for 23-polyvalent pneumococcal polysaccharide vaccine        Relevant Orders    Pneumococcal polysaccharide vaccine 23-valent greater than or equal to 2yo subcutaneous/IM (Completed)       I have discontinued Ms. Collings's ranitidine. I have also changed her canagliflozin. Additionally, I am having her start on cyclobenzaprine. Lastly, I am having her maintain her BAYER CONTOUR TEST, Probiotic Product (PROBIOTIC DAILY PO), KRILL OIL PO, omeprazole, fluticasone, bisoprolol-hydrochlorothiazide, rosuvastatin, ASPIR-LOW, and metFORMIN.  Meds ordered this encounter  Medications  . cyclobenzaprine (FLEXERIL) 10 MG tablet    Sig: Take 1 tablet (10 mg total) by mouth at bedtime as needed for muscle spasms.    Dispense:  30 tablet    Refill:  1  . canagliflozin (INVOKANA) 300 MG TABS tablet    Sig: Take 1 tablet (300 mg total) by mouth daily.    Dispense:  90 tablet    Refill:  1    D/C PREVIOUS SCRIPTS FOR THIS MEDICATION     [name], MD

## 2015-09-07 NOTE — Assessment & Plan Note (Signed)
Tolerating low dose crestor, continue

## 2015-09-07 NOTE — Assessment & Plan Note (Signed)
Avoid offending foods, take probiotics. Do not eat large meals in late evening and consider raising head of bed.  

## 2015-09-07 NOTE — Assessment & Plan Note (Signed)
didi well on Invokana but lost insurance and has not taken since Sept. Restart today and minimize simple carbs

## 2015-09-07 NOTE — Assessment & Plan Note (Signed)
Well controlled, no changes to meds. Encouraged heart healthy diet such as the DASH diet and exercise as tolerated.  °

## 2015-09-08 ENCOUNTER — Other Ambulatory Visit: Payer: Self-pay | Admitting: Family Medicine

## 2015-09-08 MED ORDER — LISINOPRIL 2.5 MG PO TABS: 2.5000 mg | ORAL_TABLET | Freq: Every day | ORAL | Status: DC

## 2015-09-08 MED FILL — LISINOPRIL 2.5 MG TABLET: 2.5 | 30 days supply | Qty: 30 | Fill #0

## 2015-09-15 MED FILL — PREDNISOLONE AC 1% EYE DROP: 1 | 30 days supply | Qty: 10 | Fill #0

## 2015-09-15 MED FILL — NEO/POLY/DEXAMET EYE OINT: 3.5-10000-0 | 14 days supply | Qty: 4 | Fill #0

## 2015-09-15 MED FILL — GATIFLOXACIN 0.5% EYE DROPS: 0.5 | 13 days supply | Qty: 3 | Fill #0

## 2015-09-16 ENCOUNTER — Encounter: Payer: Self-pay | Admitting: Gastroenterology

## 2015-09-19 MED FILL — metFORMIN HCL 1000 MG TABS: 1000 | 30 days supply | Qty: 75 | Fill #1

## 2015-10-10 MED FILL — LISINOPRIL 2.5 MG TABLET: 2.5 | 30 days supply | Qty: 30 | Fill #1

## 2015-10-20 ENCOUNTER — Ambulatory Visit (INDEPENDENT_AMBULATORY_CARE_PROVIDER_SITE_OTHER): Payer: BLUE CROSS/BLUE SHIELD | Admitting: Medical

## 2015-10-20 ENCOUNTER — Other Ambulatory Visit: Payer: Self-pay | Admitting: Family Medicine

## 2015-10-20 ENCOUNTER — Encounter: Payer: Self-pay | Admitting: Medical

## 2015-10-20 VITALS — BP 110/70 | HR 85 | Temp 98.3°F | Ht 63.0 in | Wt 189.4 lb

## 2015-10-20 DIAGNOSIS — J209 Acute bronchitis, unspecified: Secondary | ICD-10-CM | POA: Diagnosis not present

## 2015-10-20 DIAGNOSIS — R05 Cough: Secondary | ICD-10-CM

## 2015-10-20 DIAGNOSIS — K219 Gastro-esophageal reflux disease without esophagitis: Secondary | ICD-10-CM | POA: Diagnosis not present

## 2015-10-20 DIAGNOSIS — J309 Allergic rhinitis, unspecified: Secondary | ICD-10-CM | POA: Diagnosis not present

## 2015-10-20 DIAGNOSIS — R059 Cough, unspecified: Secondary | ICD-10-CM

## 2015-10-20 MED ORDER — FLUTICASONE PROPIONATE 50 MCG/ACT NA SUSP: 2.0000 | Freq: Every day | NASAL | Status: DC

## 2015-10-20 MED ORDER — HYDROCODONE-HOMATROPINE 5-1.5 MG/5ML PO SYRP: 5.0000 mL | ORAL_SOLUTION | Freq: Three times a day (TID) | ORAL | Status: DC | PRN

## 2015-10-20 MED ORDER — RANITIDINE HCL 150 MG PO CAPS: 150.0000 mg | ORAL_CAPSULE | Freq: Two times a day (BID) | ORAL | Status: DC

## 2015-10-20 MED ORDER — AZITHROMYCIN 250 MG PO TABS: ORAL_TABLET | ORAL | Status: DC

## 2015-10-20 MED FILL — metFORMIN HCL 1000 MG TABS: 1000 | 30 days supply | Qty: 75 | Fill #2

## 2015-10-20 MED FILL — HYDROCODONE-HOMATROPINE SYR: 5-1.5 | 8 days supply | Qty: 120 | Fill #0

## 2015-10-20 MED FILL — AZITHROMYCIN 250 MG TABLET: 250 | 5 days supply | Qty: 6 | Fill #0

## 2015-10-20 MED FILL — FLUTICASONE PROP 50 MCG SPR: 50 | 30 days supply | Qty: 16 | Fill #0

## 2015-10-20 MED FILL — raNITIdine HCL 150 MG TABS: 150 | 30 days supply | Qty: 60 | Fill #0

## 2015-10-20 MED FILL — BISOPROLOL-HCTZ 5-6.25 MG T: 5-6.25 | 90 days supply | Qty: 90 | Fill #0

## 2015-10-20 NOTE — Progress Notes (Signed)
Subjective:    Patient ID: [name], female    DOB: [birth year], 65 y.o.   MRN: [id number]  HPI   Pt in 4 wks of nasal congestion. States started with nasal congestion, ear pressure and dry cough. (pt does not note sneezing or itching eyes) Pt does not think spring allergies.   Pt has had some mucous that she blows from her nose early on. Sometimes productive cough.  No fever, no sweats. Thinks occasional chills.  Pt states has had cough a lot. Keeps her up at night.  Pt has history of reflux. She is on omeprazole. If she lays on rt side she will cough worse.   Review of Systems  Constitutional: Negative for fever, chills and fatigue.  HENT: Positive for congestion and postnasal drip. Negative for ear pain, nosebleeds, rhinorrhea, sinus pressure, sneezing and sore throat.   Respiratory: Positive for cough. Negative for chest tightness, wheezing and stridor.        Worse cough lying supine.  Cardiovascular: Negative for chest pain and palpitations.  Gastrointestinal: Negative for abdominal pain.  Musculoskeletal: Negative for back pain.  Hematological: Negative for adenopathy. Does not bruise/bleed easily.  Psychiatric/Behavioral: Negative for behavioral problems and confusion.   Past Medical History  Diagnosis Date  . Diaphragmatic hernia without mention of obstruction or gangrene   . Nonspecific abnormal results of liver function study   . Plantar fascial fibromatosis   . Esophageal reflux   . Unspecified sleep apnea   . Hyperlipidemia   . Diabetes mellitus 52    type 2  . Hypertension 52  . Allergy     seasonal  . Endometriosis   . Arthritis     knee-left  . Depression     job/stress related  . Pharyngitis 02/18/2012  . Nonalcoholic fatty liver disease 04/28/2010    Qualifier: Diagnosis of  By: Arnoldo Morale MD, Bal[facility] 01/22/2013  . GERD (gastroesophageal reflux disease)   . Tubular adenoma of colon   . Hyperplastic colon polyp   .  Sleep apnea     mouth piece per pt.  . Esophageal stricture      Social History   Social History  . Marital Status: Married    Spouse Name: [name]  . Number of Children: 2  . Years of Education: N/A   Occupational History  . Designer, television/film set    Social History Main Topics  . Smoking status: Never Smoker   . Smokeless tobacco: Never Used  . Alcohol Use: No  . Drug Use: No  . Sexual Activity: Yes     Comment: lives with husband, no dietary restrictions just watching carbs, wears seat belt   Other Topics Concern  . Not on file   Social History Narrative    Past Surgical History  Procedure Laterality Date  . Appendectomy    . Benigh cyst in lung  2006    right  . Abdominal hysterectomy  1982    partial  . Tubal ligation    . Seba    . Hernia repair      Family History  Problem Relation Age of Onset  . Breast cancer Mother   . Hypertension Mother   . Diabetes Mother     type 2  . Other Mother     esophagus surgery  . Heart disease Father   . Hyperlipidemia Father   . Hypertension Father   . Heart attack Father     MI  at age 16  . Diabetes Brother     type 2  . Heart disease Maternal Grandmother   . Heart attack Maternal Grandfather   . Hypertension Maternal Grandfather   . Stroke Paternal Grandmother   . Hypertension Sister   . Diabetes Sister   . Hypertension Brother   . Alcohol abuse Maternal Aunt   . Colon cancer Neg Hx   . Stomach cancer Neg Hx   . Esophageal cancer Neg Hx     Allergies  Allergen Reactions  . Pravastatin Other (See Comments)    Myalgia, fatigue, memory loss  . Amoxicillin Itching    REACTION: rash  . Atorvastatin Other (See Comments)    Myalgia, fatigue, memory loss  . Codeine Nausea And Vomiting    Current Outpatient Prescriptions on File Prior to Visit  Medication Sig Dispense Refill  . ASPIR-LOW 81 MG EC tablet TAKE 1 TABLET BY MOUTH DAILY 30 tablet 3  . BAYER CONTOUR TEST test strip USE AS INSTRUCTED 100 each 1  .  bisoprolol-hydrochlorothiazide (ZIAC) 5-6.25 MG tablet TAKE 1 TABLET BY MOUTH DAILY. 90 tablet 1  . canagliflozin (INVOKANA) 300 MG TABS tablet Take 1 tablet (300 mg total) by mouth daily. 90 tablet 1  . cyclobenzaprine (FLEXERIL) 10 MG tablet Take 1 tablet (10 mg total) by mouth at bedtime as needed for muscle spasms. 30 tablet 1  . fluticasone (FLONASE) 50 MCG/ACT nasal spray Place 1 spray into both nostrils daily.    Marland Kitchen KRILL OIL PO Take 1 tablet by mouth daily.     Marland Kitchen lisinopril (PRINIVIL,ZESTRIL) 2.5 MG tablet Take 1 tablet (2.5 mg total) by mouth daily. 30 tablet 2  . metFORMIN (GLUCOPHAGE) 1000 MG tablet TAKE 1 TABLET BY MOUTH TWICE A DAY AND 1/2 AT NOON 75 tablet 2  . omeprazole (PRILOSEC) 40 MG capsule TAKE 1 CAPSULE (40 MG TOTAL) BY MOUTH 2 TIMES DAILY. 60 capsule 10  . Probiotic Product (PROBIOTIC DAILY PO) Take 1 capsule by mouth daily.    . rosuvastatin (CRESTOR) 5 MG tablet 1/2 TABLET EVERY OTHER DAY 45 tablet 1   No current facility-administered medications on file prior to visit.    BP 110/70 mmHg  Pulse 85  Temp(Src) 98.3 F (36.8 C) (Oral)  Ht 5\' 3"  (1.6 m)  Wt 189 lb 6.4 oz (85.911 kg)  BMI 33.56 kg/m2  SpO2 98%       Objective:   Physical Exam   General  Mental Status - Alert. General Appearance - Well groomed. Not in acute distress.  Skin Rashes- No Rashes.  HEENT Head- Normal. Ear Auditory Canal - Left- Normal. Right - Normal.Tympanic Membrane- Left- Normal. Right- Normal. Eye Sclera/Conjunctiva- Left- Normal. Right- Normal. Nose & Sinuses Nasal Mucosa- Left-  Boggy and Congested. Right-  Boggy and  Congested.Bilateral no  maxillary and  no rontal sinus pressure. Mouth & Throat Lips: Upper Lip- Normal: no dryness, cracking, pallor, cyanosis, or vesicular eruption. Lower Lip-Normal: no dryness, cracking, pallor, cyanosis or vesicular eruption. Buccal Mucosa- Bilateral- No Aphthous ulcers. Oropharynx- No Discharge or Erythema. Tonsils: Characteristics-  Bilateral- No Erythema or Congestion. Size/Enlargement- Bilateral- No enlargement. Discharge- bilateral-None.  Neck Neck- Supple. No Masses.   Chest and Lung Exam Auscultation: Breath Sounds:-Clear even and unlabored.  Cardiovascular Auscultation:Rythm- Regular, rate and rhythm. Murmurs & Other Heart Sounds:Ausculatation of the heart reveal- No Murmurs.  Lymphatic Head & Neck General Head & Neck Lymphatics: Bilateral: Description- No Localized lymphadenopathy.      Assessment & Plan:  For possible allergies over past month will rx flonase.  For cough rx hycodan.  For bronchitis rx azithromycin.  If your reflux flares despite using omeprazole. Then could add ranitidine.   Follow up in 7-10 days or as needed.  If symptom persists despite the above would recommend labs and cxr.  Rx advisement given on hycodan. Explained better than other options and not codeine.    Eliyanna Ault, Percell Miller, PA-C

## 2015-10-20 NOTE — Patient Instructions (Signed)
For possible allergies over past month will rx flonase.  For cough rx hycodan.  For bronchitis rx azithromycin.  If your reflux flares despite using omeprazole. Then could add ranitidine.   Follow up in 7-10 days or as needed.  If symptom persists despite the above would recommend labs and cxr.

## 2015-10-20 NOTE — Progress Notes (Signed)
Pre visit review using our clinic review tool, if applicable. No additional management support is needed unless otherwise documented below in the visit note. 

## 2015-10-31 ENCOUNTER — Ambulatory Visit (INDEPENDENT_AMBULATORY_CARE_PROVIDER_SITE_OTHER): Payer: BLUE CROSS/BLUE SHIELD | Admitting: Family Medicine

## 2015-10-31 ENCOUNTER — Ambulatory Visit (HOSPITAL_BASED_OUTPATIENT_CLINIC_OR_DEPARTMENT_OTHER)
Admission: RE | Admit: 2015-10-31 | Discharge: 2015-10-31 | Disposition: A | Discharge status: Home / Self Care | Payer: BLUE CROSS/BLUE SHIELD | Source: Ambulatory Visit | Attending: Family Medicine | Admitting: Family Medicine

## 2015-10-31 ENCOUNTER — Encounter: Payer: Self-pay | Admitting: Family Medicine

## 2015-10-31 VITALS — BP 122/84 | HR 77 | Temp 98.3°F | Ht 63.0 in | Wt 188.5 lb

## 2015-10-31 DIAGNOSIS — R05 Cough: Secondary | ICD-10-CM

## 2015-10-31 DIAGNOSIS — E119 Type 2 diabetes mellitus without complications: Secondary | ICD-10-CM

## 2015-10-31 DIAGNOSIS — I10 Essential (primary) hypertension: Secondary | ICD-10-CM | POA: Diagnosis not present

## 2015-10-31 DIAGNOSIS — E669 Obesity, unspecified: Secondary | ICD-10-CM

## 2015-10-31 DIAGNOSIS — E1169 Type 2 diabetes mellitus with other specified complication: Secondary | ICD-10-CM

## 2015-10-31 DIAGNOSIS — J302 Other seasonal allergic rhinitis: Secondary | ICD-10-CM

## 2015-10-31 DIAGNOSIS — R059 Cough, unspecified: Secondary | ICD-10-CM

## 2015-10-31 DIAGNOSIS — J209 Acute bronchitis, unspecified: Secondary | ICD-10-CM

## 2015-10-31 HISTORY — DX: Acute bronchitis, unspecified: J20.9

## 2015-10-31 HISTORY — DX: Other seasonal allergic rhinitis: J30.2

## 2015-10-31 MED ORDER — PROMETHAZINE HCL 6.25 MG/5ML PO SYRP: 6.2500 mg | ORAL_SOLUTION | Freq: Every evening | ORAL | Status: DC | PRN

## 2015-10-31 MED ORDER — MONTELUKAST SODIUM 10 MG PO TABS: 10.0000 mg | ORAL_TABLET | Freq: Every day | ORAL | Status: DC

## 2015-10-31 MED ORDER — DOXYCYCLINE HYCLATE 100 MG PO TABS: 100.0000 mg | ORAL_TABLET | Freq: Two times a day (BID) | ORAL | Status: DC

## 2015-10-31 MED FILL — MONTELUKAST SOD 10 MG TAB: 10 | 30 days supply | Qty: 30 | Fill #0

## 2015-10-31 MED FILL — PROMETHAZINE 6.25 MG/5 ML S: 6.25 | 24 days supply | Qty: 120 | Fill #0

## 2015-10-31 NOTE — Progress Notes (Signed)
Pre visit review using our clinic review tool, if applicable. No additional management support is needed unless otherwise documented below in the visit note. 

## 2015-10-31 NOTE — Assessment & Plan Note (Addendum)
Well controlled, no changes to meds. Encouraged heart healthy diet such as the DASH diet and exercise as tolerated. Stop Lisinopril due to cough.

## 2015-10-31 NOTE — Assessment & Plan Note (Signed)
Multifactorial with intermittent fevers and chills, did not respond to Azithromycin, will treat allergies viral and give it time if worsens is given a hard copy of Doxycycline to add and encouraged to hold off for now

## 2015-10-31 NOTE — Assessment & Plan Note (Signed)
Continue Flonase, add Singulair and Zyrtec this is contributing to cough and congestion

## 2015-10-31 NOTE — Assessment & Plan Note (Signed)
hgba1c acceptable, minimize simple carbs. Increase exercise as tolerated. Continue current meds. No spike with acute illness

## 2015-10-31 NOTE — Patient Instructions (Signed)
Encouraged increased rest and hydration, add probiotics, zinc such as Coldeze or Xicam. Treat fevers a s needed. Mucinex twice daily, add Vitamin C 500 to 1000 mg daily, Elderberry and probiotic daily Babbie 10 strains, 1 cap daily. Luckyvitamins.Zyrtec/Cetirizine 10 mg daily to twice daily   Allergies An allergy is an abnormal reaction to a substance by the body's defense system (immune system). Allergies can develop at any age. WHAT CAUSES ALLERGIES? An allergic reaction happens when the immune system mistakenly reacts to a normally harmless substance, called an allergen, as if it were harmful. The immune system releases antibodies to fight the substance. Antibodies eventually release a chemical called histamine into the bloodstream. The release of histamine is meant to protect the body from infection, but it also causes discomfort. An allergic reaction can be triggered by:  Eating an allergen.  Inhaling an allergen.  Touching an allergen. WHAT TYPES OF ALLERGIES ARE THERE? There are many types of allergies. Common types include:  Seasonal allergies. People with this type of allergy are usually allergic to substances that are only present during certain seasons, such as molds and pollens.  Food allergies.  Drug allergies.  Insect allergies.  Animal dander allergies. WHAT ARE SYMPTOMS OF ALLERGIES? Possible allergy symptoms include:  Swelling of the lips, face, tongue, mouth, or throat.  Sneezing, coughing, or wheezing.  Nasal congestion.  Tingling in the mouth.  Rash.  Itching.  Itchy, red, swollen areas of skin (hives).  Watery eyes.  Vomiting.  Diarrhea.  Dizziness.  Lightheadedness.  Fainting.  Trouble breathing or swallowing.  Chest tightness.  Rapid heartbeat. HOW ARE ALLERGIES DIAGNOSED? Allergies are diagnosed with a medical and family history and one or more of the following:  Skin tests.  Blood tests.  A food diary. A food diary is a  record of all the foods and drinks you have in a day and of all the symptoms you experience.  The results of an elimination diet. An elimination diet involves eliminating foods from your diet and then adding them back in one by one to find out if a certain food causes an allergic reaction. HOW ARE ALLERGIES TREATED? There is no cure for allergies, but allergic reactions can be treated with medicine. Severe reactions usually need to be treated at a hospital. HOW CAN REACTIONS BE PREVENTED? The best way to prevent an allergic reaction is by avoiding the substance you are allergic to. Allergy shots and medicines can also help prevent reactions in some cases. People with severe allergic reactions may be able to prevent a life-threatening reaction called anaphylaxis with a medicine given right after exposure to the allergen.   This information is not intended to replace advice given to you by your health care provider. Make sure you discuss any questions you have with your health care provider.   Document Released: 08/24/2002 Document Revised: 06/21/2014 Document Reviewed: 03/12/2014 Elsevier Interactive Patient Education [employer].

## 2015-10-31 NOTE — Progress Notes (Signed)
Subjective:    Patient ID: [name], female    DOB: 1951-05-09, 65 y.o.   MRN: [id number]  Chief Complaint  Patient presents with  . Cough    HPI Patient is in today for cough.  Patient presents with a cough that has been going on x 7 weeks. Is noting intermittent fevers, chills and malaise, fatigue. Notes PND and nasal congestion productive of clear phlegm and ear pressure. No sore throat, cough keeps her up some worse qhs. Generally nonproductive. No nausea, vomiting or diarrhea except for some occasional episodes of post tussive vomiting. SOB with exertion and cough noted at times. Denies palpHA/GI or GU c/o. Taking meds as prescribed  Past Medical History  Diagnosis Date  . Diaphragmatic hernia without mention of obstruction or gangrene   . Nonspecific abnormal results of liver function study   . Plantar fascial fibromatosis   . Esophageal reflux   . Unspecified sleep apnea   . Hyperlipidemia   . Diabetes mellitus 52    type 2  . Hypertension 52  . Allergy     seasonal  . Endometriosis   . Arthritis     knee-left  . Depression     job/stress related  . Pharyngitis 02/18/2012  . Nonalcoholic fatty liver disease 04/28/2010    Qualifier: Diagnosis of  By: Arnoldo Morale MD, Balinda Quails [facility] 01/22/2013  . GERD (gastroesophageal reflux disease)   . Tubular adenoma of colon   . Hyperplastic colon polyp   . Sleep apnea     mouth piece per pt.  . Esophageal stricture   . Seasonal allergies 10/31/2015  . Acute bronchitis 10/31/2015    Past Surgical History  Procedure Laterality Date  . Appendectomy    . Benigh cyst in lung  2006    right  . Abdominal hysterectomy  1982    partial  . Tubal ligation    . Seba    . Hernia repair      Family History  Problem Relation Age of Onset  . Breast cancer Mother   . Hypertension Mother   . Diabetes Mother     type 2  . Other Mother     esophagus surgery  . Heart disease Father   . Hyperlipidemia Father     . Hypertension Father   . Heart attack Father     MI at age 63  . Diabetes Brother     type 2  . Heart disease Maternal Grandmother   . Heart attack Maternal Grandfather   . Hypertension Maternal Grandfather   . Stroke Paternal Grandmother   . Hypertension Sister   . Diabetes Sister   . Hypertension Brother   . Alcohol abuse Maternal Aunt   . Colon cancer Neg Hx   . Stomach cancer Neg Hx   . Esophageal cancer Neg Hx     Social History   Social History  . Marital Status: Married    Spouse Name: [name]  . Number of Children: 2  . Years of Education: N/A   Occupational History  . Designer, television/film set    Social History Main Topics  . Smoking status: Never Smoker   . Smokeless tobacco: Never Used  . Alcohol Use: No  . Drug Use: No  . Sexual Activity: Yes     Comment: lives with husband, no dietary restrictions just watching carbs, wears seat belt   Other Topics Concern  . Not on file   Social History Narrative  Outpatient Prescriptions Prior to Visit  Medication Sig Dispense Refill  . ASPIR-LOW 81 MG EC tablet TAKE 1 TABLET BY MOUTH DAILY 30 tablet 3  . BAYER CONTOUR TEST test strip USE AS INSTRUCTED 100 each 1  . bisoprolol-hydrochlorothiazide (ZIAC) 5-6.25 MG tablet TAKE 1 TABLET BY MOUTH DAILY. 90 tablet 1  . canagliflozin (INVOKANA) 300 MG TABS tablet Take 1 tablet (300 mg total) by mouth daily. 90 tablet 1  . cyclobenzaprine (FLEXERIL) 10 MG tablet Take 1 tablet (10 mg total) by mouth at bedtime as needed for muscle spasms. 30 tablet 1  . fluticasone (FLONASE) 50 MCG/ACT nasal spray Place 2 sprays into both nostrils daily. 16 g 1  . HYDROcodone-homatropine (HYCODAN) 5-1.5 MG/5ML syrup Take 5 mLs by mouth every 8 (eight) hours as needed for cough. 120 mL 0  . KRILL OIL PO Take 1 tablet by mouth daily.     . metFORMIN (GLUCOPHAGE) 1000 MG tablet TAKE 1 TABLET BY MOUTH TWICE A DAY AND 1/2 AT NOON 75 tablet 2  . omeprazole (PRILOSEC) 40 MG capsule TAKE 1 CAPSULE (40  MG TOTAL) BY MOUTH 2 TIMES DAILY. 60 capsule 10  . Probiotic Product (PROBIOTIC DAILY PO) Take 1 capsule by mouth daily.    . ranitidine (ZANTAC) 150 MG capsule Take 1 capsule (150 mg total) by mouth 2 (two) times daily. 60 capsule 0  . rosuvastatin (CRESTOR) 5 MG tablet 1/2 TABLET EVERY OTHER DAY 45 tablet 1  . lisinopril (PRINIVIL,ZESTRIL) 2.5 MG tablet Take 1 tablet (2.5 mg total) by mouth daily. 30 tablet 2  . azithromycin (ZITHROMAX) 250 MG tablet Take 2 tablets by mouth on day 1, followed by 1 tablet by mouth daily for 4 days. 6 tablet 0   No facility-administered medications prior to visit.    Allergies  Allergen Reactions  . Pravastatin Other (See Comments)    Myalgia, fatigue, memory loss  . Amoxicillin Itching    REACTION: rash  . Atorvastatin Other (See Comments)    Myalgia, fatigue, memory loss  . Codeine Nausea And Vomiting    Review of Systems  Constitutional: Negative for fever and malaise/fatigue.  HENT: Negative for congestion.   Eyes: Negative for blurred vision.  Respiratory: Positive for cough. Negative for shortness of breath.   Cardiovascular: Negative for chest pain, palpitations and leg swelling.  Gastrointestinal: Negative for nausea, abdominal pain and blood in stool.  Genitourinary: Negative for dysuria and frequency.  Musculoskeletal: Negative for falls.  Skin: Negative for rash.  Neurological: Negative for dizziness, loss of consciousness and headaches.  Endo/Heme/Allergies: Negative for environmental allergies.  Psychiatric/Behavioral: Negative for depression. The patient is not nervous/anxious.        Objective:    Physical Exam  Constitutional: She is oriented to person, place, and time. She appears well-developed and well-nourished. No distress.  HENT:  Head: Normocephalic and atraumatic.  Eyes: Conjunctivae are normal.  Neck: Neck supple. No thyromegaly present.  Cardiovascular: Normal rate, regular rhythm and normal heart sounds.   No  murmur heard. Pulmonary/Chest: Effort normal and breath sounds normal. No respiratory distress.  Abdominal: Soft. Bowel sounds are normal. She exhibits no distension and no mass. There is no tenderness.  Musculoskeletal: She exhibits no edema.  Lymphadenopathy:    She has no cervical adenopathy.  Neurological: She is alert and oriented to person, place, and time.  Skin: Skin is warm and dry.  Psychiatric: She has a normal mood and affect. Her behavior is normal.    BP 122/84  mmHg  Pulse 77  Temp(Src) 98.3 F (36.8 C) (Oral)  Ht 5\' 3"  (1.6 m)  Wt 188 lb 8 oz (85.503 kg)  BMI 33.40 kg/m2  SpO2 98% Wt Readings from Last 3 Encounters:  10/31/15 188 lb 8 oz (85.503 kg)  10/20/15 189 lb 6.4 oz (85.911 kg)  09/04/15 194 lb 8 oz (88.225 kg)     Lab Results  Component Value Date   WBC 5.9 08/28/2015   HGB 13.0 08/28/2015   HCT 39.3 08/28/2015   PLT 251.0 08/28/2015   GLUCOSE 182* 08/28/2015   CHOL 196 08/28/2015   TRIG 161.0* 08/28/2015   HDL 37.40* 08/28/2015   LDLCALC 126* 08/28/2015   ALT 59* 08/28/2015   AST 35 08/28/2015   NA 138 08/28/2015   K 3.9 08/28/2015   CL 101 08/28/2015   CREATININE 0.85 08/28/2015   BUN 16 08/28/2015   CO2 29 08/28/2015   TSH 1.07 08/28/2015   HGBA1C 7.9* 08/28/2015   MICROALBUR <0.7 09/05/2015    Lab Results  Component Value Date   TSH 1.07 08/28/2015   Lab Results  Component Value Date   WBC 5.9 08/28/2015   HGB 13.0 08/28/2015   HCT 39.3 08/28/2015   MCV 82.1 08/28/2015   PLT 251.0 08/28/2015   Lab Results  Component Value Date   NA 138 08/28/2015   K 3.9 08/28/2015   CO2 29 08/28/2015   GLUCOSE 182* 08/28/2015   BUN 16 08/28/2015   CREATININE 0.85 08/28/2015   BILITOT 0.5 08/28/2015   ALKPHOS 69 08/28/2015   AST 35 08/28/2015   ALT 59* 08/28/2015   PROT 7.1 08/28/2015   ALBUMIN 3.9 08/28/2015   CALCIUM 9.6 08/28/2015   ANIONGAP 16* 12/19/2013   GFR 86.88 08/28/2015   Lab Results  Component Value Date   CHOL  196 08/28/2015   Lab Results  Component Value Date   HDL 37.40* 08/28/2015   Lab Results  Component Value Date   LDLCALC 126* 08/28/2015   Lab Results  Component Value Date   TRIG 161.0* 08/28/2015   Lab Results  Component Value Date   CHOLHDL 5 08/28/2015   Lab Results  Component Value Date   HGBA1C 7.9* 08/28/2015       Assessment & Plan:   Problem List Items Addressed This Visit    Seasonal allergies    Continue Flonase, add Singulair and Zyrtec this is contributing to cough and congestion      Essential hypertension    Well controlled, no changes to meds. Encouraged heart healthy diet such as the DASH diet and exercise as tolerated. Stop Lisinopril due to cough.       Diabetes mellitus type 2 in obese (HCC)    hgba1c acceptable, minimize simple carbs. Increase exercise as tolerated. Continue current meds. No spike with acute illness      Acute bronchitis    Multifactorial with intermittent fevers and chills, did not respond to Azithromycin, will treat allergies viral and give it time if worsens is given a hard copy of Doxycycline to add and encouraged to hold off for now       Other Visit Diagnoses    Cough    -  Primary    Relevant Orders    DG Chest 2 View       I have discontinued Ms. Zielinski's lisinopril and azithromycin. I am also having her start on montelukast, doxycycline, and promethazine. Additionally, I am having her maintain her BAYER CONTOUR TEST, Probiotic  Product (PROBIOTIC DAILY PO), KRILL OIL PO, omeprazole, rosuvastatin, ASPIR-LOW, metFORMIN, cyclobenzaprine, canagliflozin, bisoprolol-hydrochlorothiazide, fluticasone, ranitidine, and HYDROcodone-homatropine.  Meds ordered this encounter  Medications  . montelukast (SINGULAIR) 10 MG tablet    Sig: Take 1 tablet (10 mg total) by mouth at bedtime.    Dispense:  30 tablet    Refill:  3  . doxycycline (VIBRA-TABS) 100 MG tablet    Sig: Take 1 tablet (100 mg total) by mouth 2 (two) times  daily. x10 days    Dispense:  20 tablet    Refill:  0  . promethazine (PHENERGAN) 6.25 MG/5ML syrup    Sig: Take 5 mLs (6.25 mg total) by mouth at bedtime as needed for nausea or vomiting.    Dispense:  120 mL    Refill:  0     [name], MD

## 2015-11-04 ENCOUNTER — Ambulatory Visit (AMBULATORY_SURGERY_CENTER): Payer: Self-pay

## 2015-11-04 VITALS — Ht 63.0 in | Wt 188.4 lb

## 2015-11-04 DIAGNOSIS — Z86010 Personal history of colonic polyps: Secondary | ICD-10-CM

## 2015-11-04 MED ORDER — NA SULFATE-K SULFATE-MG SULF 17.5-3.13-1.6 GM/177ML PO SOLN: ORAL | Status: DC

## 2015-11-04 NOTE — Progress Notes (Signed)
Per pt, no allergies to soy or egg products.Pt not taking any weight loss meds or using  O2 at home. 

## 2015-11-06 ENCOUNTER — Encounter: Payer: Self-pay | Admitting: Gastroenterology

## 2015-11-11 ENCOUNTER — Telehealth: Payer: Self-pay | Admitting: Gastroenterology

## 2015-11-11 NOTE — Telephone Encounter (Signed)
Sample obtained and left at front desk for pt to pick up.  Spoke with her so she is aware of this.

## 2015-11-18 ENCOUNTER — Encounter: Payer: Self-pay | Admitting: Gastroenterology

## 2015-11-18 ENCOUNTER — Ambulatory Visit (AMBULATORY_SURGERY_CENTER): Payer: BLUE CROSS/BLUE SHIELD | Admitting: Gastroenterology

## 2015-11-18 VITALS — BP 125/76 | HR 71 | Temp 97.7°F | Resp 11 | Ht 63.0 in | Wt 188.0 lb

## 2015-11-18 DIAGNOSIS — D128 Benign neoplasm of rectum: Secondary | ICD-10-CM | POA: Diagnosis not present

## 2015-11-18 DIAGNOSIS — K635 Polyp of colon: Secondary | ICD-10-CM | POA: Diagnosis not present

## 2015-11-18 DIAGNOSIS — D127 Benign neoplasm of rectosigmoid junction: Secondary | ICD-10-CM | POA: Diagnosis not present

## 2015-11-18 DIAGNOSIS — D123 Benign neoplasm of transverse colon: Secondary | ICD-10-CM

## 2015-11-18 DIAGNOSIS — D125 Benign neoplasm of sigmoid colon: Secondary | ICD-10-CM

## 2015-11-18 DIAGNOSIS — D129 Benign neoplasm of anus and anal canal: Secondary | ICD-10-CM

## 2015-11-18 DIAGNOSIS — Z86010 Personal history of colonic polyps: Secondary | ICD-10-CM | POA: Diagnosis present

## 2015-11-18 LAB — GLUCOSE, CAPILLARY
GLUCOSE-CAPILLARY: 126 mg/dL — AB (ref 65–99)
Glucose-Capillary: 107 mg/dL — ABNORMAL HIGH (ref 65–99)

## 2015-11-18 MED ORDER — SODIUM CHLORIDE 0.9 % IV SOLN: 500.0000 mL | INTRAVENOUS | Status: DC

## 2015-11-18 NOTE — Op Note (Signed)
[facility] Patient Name: [name] Procedure Date: 11/18/2015 10:36 AM MRN: [id number] Endoscopist: Ladene Artist , MD Age: 65 Referring MD:  Date of Birth: [birth year] Gender: Female Procedure:                Colonoscopy Indications:              Surveillance: Personal history of adenomatous                            polyps on last colonoscopy > 5 years ago Medicines:                Monitored Anesthesia Care Procedure:                Pre-Anesthesia Assessment:                           - Prior to the procedure, a History and Physical                            was performed, and patient medications and                            allergies were reviewed. The patient's tolerance of                            previous anesthesia was also reviewed. The risks                            and benefits of the procedure and the sedation                            options and risks were discussed with the patient.                            All questions were answered, and informed consent                            was obtained. Prior Anticoagulants: The patient has                            taken no previous anticoagulant or antiplatelet                            agents. ASA Grade Assessment: II - A patient with                            mild systemic disease. After reviewing the risks                            and benefits, the patient was deemed in                            satisfactory condition to undergo the procedure.  After obtaining informed consent, the colonoscope                            was passed under direct vision. Throughout the                            procedure, the patient's blood pressure, pulse, and                            oxygen saturations were monitored continuously. The                            Model PCF-H190L [id number]) scope was introduced                            through the anus and advanced to the the  cecum,                            identified by appendiceal orifice and ileocecal                            valve. The colonoscopy was performed without                            difficulty. The patient tolerated the procedure                            well. The quality of the bowel preparation was                            good. The ileocecal valve, appendiceal orifice, and                            rectum were photographed. Scope In: 10:47:27 AM Scope Out: 11:01:20 AM Scope Withdrawal Time: 0 hours 12 minutes 22 seconds  Total Procedure Duration: 0 hours 13 minutes 53 seconds  Findings:                 The digital rectal exam was normal.                           Five sessile polyps were found in the recto-sigmoid                            colon, sigmoid colon and transverse colon. The                            polyps were 3 to 4 mm in size. These polyps were                            removed with a cold biopsy forceps. Resection and                            retrieval were complete.  The exam was otherwise normal throughout the                            examined colon.                           The retroflexed view of the distal rectum and anal                            verge was normal and showed no anal or rectal                            abnormalities. Complications:            No immediate complications. Estimated Blood Loss:     Estimated blood loss: none. Impression:               - Five 3 to 4 mm polyps at the recto-sigmoid colon,                            in the sigmoid colon and in the transverse colon,                            removed with a cold biopsy forceps. Resected and                            retrieved. Recommendation:           - Patient has a contact number available for                            emergencies. The signs and symptoms of potential                            delayed complications were discussed with the                             patient. Return to normal activities tomorrow.                            Written discharge instructions were provided to the                            patient.                           - Resume previous diet.                           - Continue present medications.                           - Await pathology results.                           - Repeat colonoscopy in 5 years for surveillance. Ladene Artist, MD 11/18/2015 11:07:10 AM This  report has been signed electronically.

## 2015-11-18 NOTE — Progress Notes (Signed)
Called to room to assist during endoscopic procedure.  Patient ID and intended procedure confirmed with present staff. Received instructions for my participation in the procedure from the performing physician.  

## 2015-11-18 NOTE — Progress Notes (Signed)
No egg or soy allergy known to patient  No issues with past sedation with any surgeries  or procedures, no intubation problems  No diet pills per patient No home 02 use per patient  No blood thinners per patient  Pt denies issues with constipation   

## 2015-11-18 NOTE — Patient Instructions (Signed)
YOU HAD AN ENDOSCOPIC PROCEDURE TODAY AT THE [facility]:   Refer to the procedure report that was given to you for any specific questions about what was found during the examination.  If the procedure report does not answer your questions, please call your gastroenterologist to clarify.  If you requested that your care partner not be given the details of your procedure findings, then the procedure report has been included in a sealed envelope for you to review at your convenience later.  YOU SHOULD EXPECT: Some feelings of bloating in the abdomen. Passage of more gas than usual.  Walking can help get rid of the air that was put into your GI tract during the procedure and reduce the bloating. If you had a lower endoscopy (such as a colonoscopy or flexible sigmoidoscopy) you may notice spotting of blood in your stool or on the toilet paper. If you underwent a bowel prep for your procedure, you may not have a normal bowel movement for a few days.  Please Note:  You might notice some irritation and congestion in your nose or some drainage.  This is from the oxygen used during your procedure.  There is no need for concern and it should clear up in a day or so.  SYMPTOMS TO REPORT IMMEDIATELY:   Following lower endoscopy (colonoscopy or flexible sigmoidoscopy):  Excessive amounts of blood in the stool  Significant tenderness or worsening of abdominal pains  Swelling of the abdomen that is new, acute  Fever of 100F or higher   For urgent or emergent issues, a gastroenterologist can be reached at any hour by calling [phone].   DIET: Your first meal following the procedure should be a small meal and then it is ok to progress to your normal diet. Heavy or fried foods are harder to digest and may make you feel nauseous or bloated.  Likewise, meals heavy in dairy and vegetables can increase bloating.  Drink plenty of fluids but you should avoid alcoholic beverages for 24  hours.  ACTIVITY:  You should plan to take it easy for the rest of today and you should NOT DRIVE or use heavy machinery until tomorrow (because of the sedation medicines used during the test).    FOLLOW UP: Our staff will call the number listed on your records the next business day following your procedure to check on you and address any questions or concerns that you may have regarding the information given to you following your procedure. If we do not reach you, we will leave a message.  However, if you are feeling well and you are not experiencing any problems, there is no need to return our call.  We will assume that you have returned to your regular daily activities without incident.  If any biopsies were taken you will be contacted by phone or by letter within the next 1-3 weeks.  Please call us at [phone] if you have not heard about the biopsies in 3 weeks.    SIGNATURES/CONFIDENTIALITY: You and/or your care partner have signed paperwork which will be entered into your electronic medical record.  These signatures attest to the fact that that the information above on your After Visit Summary has been reviewed and is understood.  Full responsibility of the confidentiality of this discharge information lies with you and/or your care-partner. 

## 2015-11-18 NOTE — Progress Notes (Signed)
Report to PACU, RN, vss, BBS= Clear.  

## 2015-11-19 ENCOUNTER — Telehealth: Payer: Self-pay | Admitting: *Deleted

## 2015-11-19 NOTE — Telephone Encounter (Signed)
  Follow up Call-  Call back number 11/18/2015 05/28/2014  Post procedure Call Back phone  # [phone] home, cell [phone]  Permission to leave phone message Yes Yes     Patient questions:  Do you have a fever, pain , or abdominal swelling? No. Pain Score  0 *  Have you tolerated food without any problems? Yes.    Have you been able to return to your normal activities? Yes.    Do you have any questions about your discharge instructions: Diet   No. Medications  No. Follow up visit  No.  Do you have questions or concerns about your Care? No.  Actions: * If pain score is 4 or above: No action needed, pain <4.

## 2015-11-21 MED FILL — PREDNISOLONE AC 1% EYE DROP: 1 | 50 days supply | Qty: 10 | Fill #0

## 2015-11-21 MED FILL — GATIFLOXACIN 0.5% EYE DROPS: 0.5 | 13 days supply | Qty: 3 | Fill #0

## 2015-11-21 MED FILL — NEO/POLY/DEXAMET EYE OINT: 3.5-10000-0 | 14 days supply | Qty: 4 | Fill #0

## 2015-11-24 ENCOUNTER — Encounter: Payer: Self-pay | Admitting: Gastroenterology

## 2015-11-24 ENCOUNTER — Other Ambulatory Visit: Payer: Self-pay | Admitting: Family Medicine

## 2015-11-24 ENCOUNTER — Other Ambulatory Visit: Payer: Self-pay | Admitting: Gastroenterology

## 2015-11-24 MED FILL — metFORMIN HCL 1000 MG TABS: 1000 | 30 days supply | Qty: 75 | Fill #0

## 2015-11-24 MED FILL — OMEPRAZOLE DR 40 MG CAPSULE: 40 | 30 days supply | Qty: 60 | Fill #0

## 2015-12-03 MED FILL — KETOROLAC 0.4% OPHTH SOLN: 0.4 | 25 days supply | Qty: 5 | Fill #0

## 2015-12-03 MED FILL — INVOKANA 300 MG TABLET: 300 | 30 days supply | Qty: 30 | Fill #1

## 2015-12-04 ENCOUNTER — Other Ambulatory Visit: Payer: BLUE CROSS/BLUE SHIELD

## 2015-12-04 ENCOUNTER — Other Ambulatory Visit (INDEPENDENT_AMBULATORY_CARE_PROVIDER_SITE_OTHER): Payer: BLUE CROSS/BLUE SHIELD

## 2015-12-04 DIAGNOSIS — I10 Essential (primary) hypertension: Secondary | ICD-10-CM | POA: Diagnosis not present

## 2015-12-04 DIAGNOSIS — E782 Mixed hyperlipidemia: Secondary | ICD-10-CM

## 2015-12-04 DIAGNOSIS — M546 Pain in thoracic spine: Secondary | ICD-10-CM

## 2015-12-04 DIAGNOSIS — K760 Fatty (change of) liver, not elsewhere classified: Secondary | ICD-10-CM | POA: Diagnosis not present

## 2015-12-04 DIAGNOSIS — E1169 Type 2 diabetes mellitus with other specified complication: Secondary | ICD-10-CM

## 2015-12-04 DIAGNOSIS — E119 Type 2 diabetes mellitus without complications: Secondary | ICD-10-CM | POA: Diagnosis not present

## 2015-12-04 DIAGNOSIS — E669 Obesity, unspecified: Secondary | ICD-10-CM

## 2015-12-04 LAB — HEMOGLOBIN A1C: HEMOGLOBIN A1C: 7.2 % — AB (ref 4.6–6.5)

## 2015-12-04 LAB — LIPID PANEL
CHOL/HDL RATIO: 5
CHOLESTEROL: 226 mg/dL — AB (ref 0–200)
HDL: 45.2 mg/dL (ref >39.00)
LDL CALC: 149 mg/dL — AB (ref 0–99)
NonHDL: 180.59
TRIGLYCERIDES: 159 mg/dL — AB (ref 0.0–149.0)
VLDL: 31.8 mg/dL (ref 0.0–40.0)

## 2015-12-04 LAB — CBC
HCT: 42 % (ref 36.0–46.0)
Hemoglobin: 13.7 g/dL (ref 12.0–15.0)
MCHC: 32.6 g/dL (ref 30.0–36.0)
MCV: 83.4 fl (ref 78.0–100.0)
PLATELETS: 251 10*3/uL (ref 150.0–400.0)
RBC: 5.04 Mil/uL (ref 3.87–5.11)
RDW: 13.6 % (ref 11.5–15.5)
WBC: 5.9 10*3/uL (ref 4.0–10.5)

## 2015-12-04 LAB — COMPREHENSIVE METABOLIC PANEL
ALBUMIN: 4.1 g/dL (ref 3.5–5.2)
ALT: 31 U/L (ref 0–35)
AST: 20 U/L (ref 0–37)
Alkaline Phosphatase: 70 U/L (ref 39–117)
BILIRUBIN TOTAL: 0.4 mg/dL (ref 0.2–1.2)
BUN: 14 mg/dL (ref 6–23)
CALCIUM: 9.9 mg/dL (ref 8.4–10.5)
CO2: 33 meq/L — AB (ref 19–32)
Chloride: 100 mEq/L (ref 96–112)
Creatinine, Ser: 0.91 mg/dL (ref 0.40–1.20)
GFR: 80.24 mL/min (ref >60.00)
Glucose, Bld: 123 mg/dL — ABNORMAL HIGH (ref 70–99)
Potassium: 4 mEq/L (ref 3.5–5.1)
Sodium: 138 mEq/L (ref 135–145)
Total Protein: 7.3 g/dL (ref 6.0–8.3)

## 2015-12-04 LAB — MICROALBUMIN / CREATININE URINE RATIO
CREATININE, U: 68.3 mg/dL
MICROALB/CREAT RATIO: 1 mg/g (ref 0.0–30.0)

## 2015-12-04 LAB — TSH: TSH: 0.94 u[IU]/mL (ref 0.35–4.50)

## 2015-12-05 ENCOUNTER — Other Ambulatory Visit: Payer: Self-pay | Admitting: Family Medicine

## 2015-12-05 LAB — VARICELLA ZOSTER ANTIBODY, IGG: VARICELLA IGG: 1081 {index} — AB (ref <135.00)

## 2015-12-05 MED ORDER — ROSUVASTATIN CALCIUM 5 MG PO TABS: ORAL_TABLET | ORAL | Status: DC

## 2015-12-09 ENCOUNTER — Ambulatory Visit (INDEPENDENT_AMBULATORY_CARE_PROVIDER_SITE_OTHER): Payer: BLUE CROSS/BLUE SHIELD | Admitting: Family Medicine

## 2015-12-09 ENCOUNTER — Encounter: Payer: Self-pay | Admitting: Family Medicine

## 2015-12-09 VITALS — BP 120/82 | HR 72 | Temp 98.2°F | Ht 63.0 in | Wt 188.0 lb

## 2015-12-09 DIAGNOSIS — E669 Obesity, unspecified: Secondary | ICD-10-CM

## 2015-12-09 DIAGNOSIS — I10 Essential (primary) hypertension: Secondary | ICD-10-CM | POA: Diagnosis not present

## 2015-12-09 DIAGNOSIS — E119 Type 2 diabetes mellitus without complications: Secondary | ICD-10-CM

## 2015-12-09 DIAGNOSIS — K219 Gastro-esophageal reflux disease without esophagitis: Secondary | ICD-10-CM | POA: Diagnosis not present

## 2015-12-09 DIAGNOSIS — F418 Other specified anxiety disorders: Secondary | ICD-10-CM | POA: Diagnosis not present

## 2015-12-09 DIAGNOSIS — E1169 Type 2 diabetes mellitus with other specified complication: Secondary | ICD-10-CM

## 2015-12-09 NOTE — Assessment & Plan Note (Signed)
hgba1c acceptable, minimize simple carbs. Increase exercise as tolerated. Continue current meds 

## 2015-12-09 NOTE — Assessment & Plan Note (Signed)
Avoid offending foods, start probiotics. Do not eat large meals in late evening and consider raising head of bed.  

## 2015-12-09 NOTE — Assessment & Plan Note (Signed)
Juggling a great deal of stress with insurance complaints and family stressors. Not currently taking any meds

## 2015-12-09 NOTE — Assessment & Plan Note (Signed)
Well controlled, no changes to meds. Encouraged heart healthy diet such as the DASH diet and exercise as tolerated.  °

## 2015-12-09 NOTE — Progress Notes (Signed)
Pre visit review using our clinic review tool, if applicable. No additional management support is needed unless otherwise documented below in the visit note. 

## 2015-12-09 NOTE — Progress Notes (Signed)
Patient ID: [name], female   DOB: Nov 02, 1950, 65 y.o.   MRN: [id number]   Subjective:    Patient ID: [name], female    DOB: 24-Jan-1951, 65 y.o.   MRN: [id number]  Chief Complaint  Patient presents with  . Follow-up    3 month    HPI Patient is in today for follow up. Doing fairly well. Fasating sugars in 120s after eating around 150. No polyuria or polydipsia. No recent illness or acute concerns. Is very stressed with a sick brother with cirrhosis living at her home. Denies CP/palp/SOB/HA/congestion/fevers/GI or GU c/o. Taking meds as prescribed  Past Medical History  Diagnosis Date  . Diaphragmatic hernia without mention of obstruction or gangrene   . Nonspecific abnormal results of liver function study   . Plantar fascial fibromatosis   . Esophageal reflux   . Unspecified sleep apnea     no c-pap  . Hyperlipidemia   . Diabetes mellitus 52    type 2  . Hypertension 52  . Allergy     seasonal  . Endometriosis   . Arthritis     knee-left  . Depression     job/stress related  . Pharyngitis 02/18/2012  . Nonalcoholic fatty liver disease 04/28/2010    Qualifier: Diagnosis of  By: Arnoldo Morale MD, [facility] 01/22/2013  . GERD (gastroesophageal reflux disease)   . Tubular adenoma of colon   . Hyperplastic colon polyp   . Sleep apnea     mouth piece per pt.  . Esophageal stricture   . Seasonal allergies 10/31/2015  . Acute bronchitis 10/31/2015  . Cataract     right eye    Past Surgical History  Procedure Laterality Date  . Appendectomy    . Benigh cyst in lung  2006    right  . Abdominal hysterectomy  1982    partial  . Tubal ligation    . [place]ba    . Hernia repair      per pt, she is not aware of this surgery  . Breast cyst incision and drainage      right breast  . Cataract extraction      right eye    Family History  Problem Relation Age of Onset  . Breast cancer Mother   . Hypertension Mother   . Diabetes Mother    type 2  . Other Mother     esophagus surgery  . Heart disease Father   . Hyperlipidemia Father   . Hypertension Father   . Heart attack Father     MI at age 62  . Diabetes Brother     type 2  . Heart disease Maternal Grandmother   . Heart attack Maternal Grandfather   . Hypertension Maternal Grandfather   . Stroke Paternal Grandmother   . Hypertension Sister   . Diabetes Sister   . Hypertension Brother   . Alcohol abuse Maternal Aunt   . Colon cancer Neg Hx   . Stomach cancer Neg Hx   . Esophageal cancer Neg Hx     Social History   Social History  . Marital Status: Married    Spouse Name: [name]  . Number of Children: 2  . Years of Education: N/A   Occupational History  . Designer, television/film set    Social History Main Topics  . Smoking status: Never Smoker   . Smokeless tobacco: Never Used  . Alcohol Use: No  . Drug Use: No  .  Sexual Activity: Yes     Comment: lives with husband, no dietary restrictions just watching carbs, wears seat belt   Other Topics Concern  . Not on file   Social History Narrative    Outpatient Prescriptions Prior to Visit  Medication Sig Dispense Refill  . ASPIR-LOW 81 MG EC tablet TAKE 1 TABLET BY MOUTH DAILY 30 tablet 3  . BAYER CONTOUR TEST test strip USE AS INSTRUCTED 100 each 1  . bisoprolol-hydrochlorothiazide (ZIAC) 5-6.25 MG tablet TAKE 1 TABLET BY MOUTH DAILY. 90 tablet 1  . canagliflozin (INVOKANA) 300 MG TABS tablet Take 1 tablet (300 mg total) by mouth daily. 90 tablet 1  . fluticasone (FLONASE) 50 MCG/ACT nasal spray Place 2 sprays into both nostrils daily. 16 g 1  . KRILL OIL PO Take 1 tablet by mouth daily.     . metFORMIN (GLUCOPHAGE) 1000 MG tablet TAKE 1 TABLET BY MOUTH TWICE A DAY AND TAKE 1/2 TABLET AT NOON 75 tablet 2  . omeprazole (PRILOSEC) 40 MG capsule TAKE 1 CAPSULE (40 MG) BY MOUTH 2 TIMES A DAY. 60 capsule 0  . Probiotic Product (PROBIOTIC DAILY PO) Take 1 capsule by mouth daily.    . promethazine (PHENERGAN) 6.25  MG/5ML syrup Take 5 mLs (6.25 mg total) by mouth at bedtime as needed for nausea or vomiting. 120 mL 0  . ranitidine (ZANTAC) 150 MG capsule Take 1 capsule (150 mg total) by mouth 2 (two) times daily. 60 capsule 0  . rosuvastatin (CRESTOR) 5 MG tablet 1/2 TABLET EVERY DAY 15 tablet 1  . HYDROcodone-homatropine (HYCODAN) 5-1.5 MG/5ML syrup Take 5 mLs by mouth every 8 (eight) hours as needed for cough. 120 mL 0  . montelukast (SINGULAIR) 10 MG tablet Take 1 tablet (10 mg total) by mouth at bedtime. 30 tablet 3  . cyclobenzaprine (FLEXERIL) 10 MG tablet Take 1 tablet (10 mg total) by mouth at bedtime as needed for muscle spasms. (Patient not taking: Reported on 12/09/2015) 30 tablet 1   No facility-administered medications prior to visit.    Allergies  Allergen Reactions  . Pravastatin Other (See Comments)    Myalgia, fatigue, memory loss  . Amoxicillin Itching    REACTION: rash  . Atorvastatin Other (See Comments)    Myalgia, fatigue, memory loss  . Codeine Nausea And Vomiting    Review of Systems  Constitutional: Negative for fever and malaise/fatigue.  HENT: Negative for congestion.   Eyes: Negative for blurred vision.  Respiratory: Negative for shortness of breath.   Cardiovascular: Negative for chest pain, palpitations and leg swelling.  Gastrointestinal: Negative for nausea, abdominal pain and blood in stool.  Genitourinary: Negative for dysuria and frequency.  Musculoskeletal: Negative for falls.  Skin: Negative for rash.  Neurological: Negative for dizziness, loss of consciousness and headaches.  Endo/Heme/Allergies: Negative for environmental allergies.  Psychiatric/Behavioral: Negative for depression. The patient is nervous/anxious.        Objective:    Physical Exam  Constitutional: She is oriented to person, place, and time. She appears well-developed and well-nourished. No distress.  HENT:  Head: Normocephalic and atraumatic.  Nose: Nose normal.  Eyes: Right eye  exhibits no discharge. Left eye exhibits no discharge.  Neck: Normal range of motion. Neck supple.  Cardiovascular: Normal rate and regular rhythm.   No murmur heard. Pulmonary/Chest: Effort normal and breath sounds normal.  Abdominal: Soft. Bowel sounds are normal. There is no tenderness.  Musculoskeletal: She exhibits no edema.  Neurological: She is alert and oriented to  person, place, and time.  Skin: Skin is warm and dry.  Psychiatric: She has a normal mood and affect.  Nursing note and vitals reviewed.   BP 120/82 mmHg  Pulse 72  Temp(Src) 98.2 F (36.8 C) (Oral)  Ht 5\' 3"  (1.6 m)  Wt 188 lb (85.276 kg)  BMI 33.31 kg/m2  SpO2 95% Wt Readings from Last 3 Encounters:  12/09/15 188 lb (85.276 kg)  11/18/15 188 lb (85.276 kg)  11/04/15 188 lb 6.4 oz (85.458 kg)     Lab Results  Component Value Date   WBC 5.9 12/04/2015   HGB 13.7 12/04/2015   HCT 42.0 12/04/2015   PLT 251.0 12/04/2015   GLUCOSE 123* 12/04/2015   CHOL 226* 12/04/2015   TRIG 159.0* 12/04/2015   HDL 45.20 12/04/2015   LDLCALC 149* 12/04/2015   ALT 31 12/04/2015   AST 20 12/04/2015   NA 138 12/04/2015   K 4.0 12/04/2015   CL 100 12/04/2015   CREATININE 0.91 12/04/2015   BUN 14 12/04/2015   CO2 33* 12/04/2015   TSH 0.94 12/04/2015   HGBA1C 7.2* 12/04/2015   MICROALBUR <0.7 12/04/2015    Lab Results  Component Value Date   TSH 0.94 12/04/2015   Lab Results  Component Value Date   WBC 5.9 12/04/2015   HGB 13.7 12/04/2015   HCT 42.0 12/04/2015   MCV 83.4 12/04/2015   PLT 251.0 12/04/2015   Lab Results  Component Value Date   NA 138 12/04/2015   K 4.0 12/04/2015   CO2 33* 12/04/2015   GLUCOSE 123* 12/04/2015   BUN 14 12/04/2015   CREATININE 0.91 12/04/2015   BILITOT 0.4 12/04/2015   ALKPHOS 70 12/04/2015   AST 20 12/04/2015   ALT 31 12/04/2015   PROT 7.3 12/04/2015   ALBUMIN 4.1 12/04/2015   CALCIUM 9.9 12/04/2015   ANIONGAP 16* 12/19/2013   GFR 80.24 12/04/2015   Lab Results    Component Value Date   CHOL 226* 12/04/2015   Lab Results  Component Value Date   HDL 45.20 12/04/2015   Lab Results  Component Value Date   LDLCALC 149* 12/04/2015   Lab Results  Component Value Date   TRIG 159.0* 12/04/2015   Lab Results  Component Value Date   CHOLHDL 5 12/04/2015   Lab Results  Component Value Date   HGBA1C 7.2* 12/04/2015       Assessment & Plan:   Problem List Items Addressed This Visit    GERD    Avoid offending foods, start probiotics. Do not eat large meals in late evening and consider raising head of bed.       Essential hypertension - Primary    Well controlled, no changes to meds. Encouraged heart healthy diet such as the DASH diet and exercise as tolerated.       Diabetes mellitus type 2 in obese (HCC)    hgba1c acceptable, minimize simple carbs. Increase exercise as tolerated. Continue current meds      Depression with anxiety    Juggling a great deal of stress with insurance complaints and family stressors. Not currently taking any meds         I have discontinued Ms. Reller's HYDROcodone-homatropine and montelukast. I am also having her maintain her BAYER CONTOUR TEST, Probiotic Product (PROBIOTIC DAILY PO), KRILL OIL PO, ASPIR-LOW, cyclobenzaprine, canagliflozin, bisoprolol-hydrochlorothiazide, fluticasone, ranitidine, promethazine, metFORMIN, omeprazole, and rosuvastatin.  No orders of the defined types were placed in this encounter.     [name],  MD

## 2015-12-09 NOTE — Patient Instructions (Signed)
DASH Eating Plan  DASH stands for "Dietary Approaches to Stop Hypertension." The DASH eating plan is a healthy eating plan that has been shown to reduce high blood pressure (hypertension). Additional health benefits may include reducing the risk of type 2 diabetes mellitus, heart disease, and stroke. The DASH eating plan may also help with weight loss.  WHAT DO I NEED TO KNOW ABOUT THE DASH EATING PLAN?  For the DASH eating plan, you will follow these general guidelines:  · Choose foods with a percent daily value for sodium of less than 5% (as listed on the food label).  · Use salt-free seasonings or herbs instead of table salt or sea salt.  · Check with your health care provider or pharmacist before using salt substitutes.  · Eat lower-sodium products, often labeled as "lower sodium" or "no salt added."  · Eat fresh foods.  · Eat more vegetables, fruits, and low-fat dairy products.  · Choose whole grains. Look for the word "whole" as the first word in the ingredient list.  · Choose fish and skinless chicken or turkey more often than red meat. Limit fish, poultry, and meat to 6 oz (170 g) each day.  · Limit sweets, desserts, sugars, and sugary drinks.  · Choose heart-healthy fats.  · Limit cheese to 1 oz (28 g) per day.  · Eat more home-cooked food and less restaurant, buffet, and fast food.  · Limit fried foods.  · Cook foods using methods other than frying.  · Limit canned vegetables. If you do use them, rinse them well to decrease the sodium.  · When eating at a restaurant, ask that your food be prepared with less salt, or no salt if possible.  WHAT FOODS CAN I EAT?  Seek help from a dietitian for individual calorie needs.  Grains  Whole grain or whole wheat bread. Brown rice. Whole grain or whole wheat pasta. Quinoa, bulgur, and whole grain cereals. Low-sodium cereals. Corn or whole wheat flour tortillas. Whole grain cornbread. Whole grain crackers. Low-sodium crackers.  Vegetables  Fresh or frozen vegetables  (raw, steamed, roasted, or grilled). Low-sodium or reduced-sodium tomato and vegetable juices. Low-sodium or reduced-sodium tomato sauce and paste. Low-sodium or reduced-sodium canned vegetables.   Fruits  All fresh, canned (in natural juice), or frozen fruits.  Meat and Other Protein Products  Ground beef (85% or leaner), grass-fed beef, or beef trimmed of fat. Skinless chicken or turkey. Ground chicken or turkey. Pork trimmed of fat. All fish and seafood. Eggs. Dried beans, peas, or lentils. Unsalted nuts and seeds. Unsalted canned beans.  Dairy  Low-fat dairy products, such as skim or 1% milk, 2% or reduced-fat cheeses, low-fat ricotta or cottage cheese, or plain low-fat yogurt. Low-sodium or reduced-sodium cheeses.  Fats and Oils  Tub margarines without trans fats. Light or reduced-fat mayonnaise and salad dressings (reduced sodium). Avocado. Safflower, olive, or canola oils. Natural peanut or almond butter.  Other  Unsalted popcorn and pretzels.  The items listed above may not be a complete list of recommended foods or beverages. Contact your dietitian for more options.  WHAT FOODS ARE NOT RECOMMENDED?  Grains  White bread. White pasta. White rice. Refined cornbread. Bagels and croissants. Crackers that contain trans fat.  Vegetables  Creamed or fried vegetables. Vegetables in a cheese sauce. Regular canned vegetables. Regular canned tomato sauce and paste. Regular tomato and vegetable juices.  Fruits  Dried fruits. Canned fruit in light or heavy syrup. Fruit juice.  Meat and Other Protein   Products  Fatty cuts of meat. Ribs, chicken wings, bacon, sausage, bologna, salami, chitterlings, fatback, hot dogs, bratwurst, and packaged luncheon meats. Salted nuts and seeds. Canned beans with salt.  Dairy  Whole or 2% milk, cream, half-and-half, and cream cheese. Whole-fat or sweetened yogurt. Full-fat cheeses or blue cheese. Nondairy creamers and whipped toppings. Processed cheese, cheese spreads, or cheese  curds.  Condiments  Onion and garlic salt, seasoned salt, table salt, and sea salt. Canned and packaged gravies. Worcestershire sauce. Tartar sauce. Barbecue sauce. Teriyaki sauce. Soy sauce, including reduced sodium. Steak sauce. Fish sauce. Oyster sauce. Cocktail sauce. Horseradish. Ketchup and mustard. Meat flavorings and tenderizers. Bouillon cubes. Hot sauce. Tabasco sauce. Marinades. Taco seasonings. Relishes.  Fats and Oils  Butter, stick margarine, lard, shortening, ghee, and bacon fat. Coconut, palm kernel, or palm oils. Regular salad dressings.  Other  Pickles and olives. Salted popcorn and pretzels.  The items listed above may not be a complete list of foods and beverages to avoid. Contact your dietitian for more information.  WHERE CAN I FIND MORE INFORMATION?  National Heart, Lung, and [employer] Institute: www.nhlbi.nih.gov/health/health-topics/topics/dash/     This information is not intended to replace advice given to you by your health care provider. Make sure you discuss any questions you have with your health care provider.     Document Released: 05/20/2011 Document Revised: 06/21/2014 Document Reviewed: 04/04/2013  Elsevier Interactive Patient Education ©2016 [employer]

## 2015-12-11 MED FILL — CRESTOR 5 MG TABLET: 5 | 30 days supply | Qty: 15 | Fill #0

## 2016-01-08 MED FILL — INVOKANA 300 MG TABLET: 300 | 30 days supply | Qty: 30 | Fill #2

## 2016-01-08 MED FILL — metFORMIN HCL 1000 MG TABS: 1000 | 30 days supply | Qty: 75 | Fill #1

## 2016-01-19 MED FILL — BISOPROLOL-HCTZ 5-6.25 MG T: 5-6.25 | 90 days supply | Qty: 90 | Fill #1

## 2016-01-22 ENCOUNTER — Other Ambulatory Visit: Payer: Self-pay | Admitting: Gastroenterology

## 2016-01-22 MED FILL — CRESTOR 5 MG TABLET: 5 | 30 days supply | Qty: 15 | Fill #1

## 2016-01-22 MED FILL — OMEPRAZOLE DR 40 MG CAPSULE: 40 | 30 days supply | Qty: 60 | Fill #0

## 2016-02-02 LAB — HM MAMMOGRAPHY

## 2016-02-06 MED FILL — INVOKANA 300 MG TABLET: 300 | 30 days supply | Qty: 30 | Fill #3

## 2016-02-06 MED FILL — metFORMIN HCL 1000 MG TABS: 1000 | 30 days supply | Qty: 75 | Fill #2

## 2016-02-10 ENCOUNTER — Encounter: Payer: Self-pay | Admitting: Family Medicine

## 2016-02-24 ENCOUNTER — Other Ambulatory Visit: Payer: Self-pay | Admitting: Gastroenterology

## 2016-02-24 MED FILL — OMEPRAZOLE DR 40 MG CAPSULE: 40 | 30 days supply | Qty: 60 | Fill #0

## 2016-03-01 ENCOUNTER — Encounter: Payer: Self-pay | Admitting: Physician Assistant

## 2016-03-01 ENCOUNTER — Ambulatory Visit (INDEPENDENT_AMBULATORY_CARE_PROVIDER_SITE_OTHER): Payer: BLUE CROSS/BLUE SHIELD | Admitting: Physician Assistant

## 2016-03-01 VITALS — BP 122/77 | HR 71 | Temp 98.2°F | Resp 16 | Ht 63.0 in | Wt 190.0 lb

## 2016-03-01 DIAGNOSIS — R6884 Jaw pain: Secondary | ICD-10-CM | POA: Diagnosis not present

## 2016-03-01 LAB — TROPONIN I: TNIDX: 0.01 ug/L (ref 0.00–0.06)

## 2016-03-01 MED ORDER — CANAGLIFLOZIN 300 MG PO TABS: 300.0000 mg | ORAL_TABLET | Freq: Every day | ORAL | 1 refills | Status: DC

## 2016-03-01 MED ORDER — METHYLPREDNISOLONE 4 MG PO TBPK: ORAL_TABLET | ORAL | 0 refills | Status: DC

## 2016-03-01 MED ORDER — METFORMIN HCL 1000 MG PO TABS: ORAL_TABLET | ORAL | 2 refills | Status: DC

## 2016-03-01 MED FILL — INVOKANA 300 MG TABLET: 300 | 30 days supply | Qty: 30 | Fill #0

## 2016-03-01 MED FILL — metFORMIN HCL 1000 MG TABS: 1000 | 30 days supply | Qty: 75 | Fill #0

## 2016-03-01 MED FILL — METHYLPREDNISOLONE 4 MG TAB: 4 | 6 days supply | Qty: 21 | Fill #0

## 2016-03-01 NOTE — Patient Instructions (Signed)
Your symptoms and exam seem consistent with TMJ pain/dysfunction. Please take an Aleve twice daily with food. Apply Topical Icy Hot to the area. Avoid chewing on affected side or at least eat softer foods. Cold compresses will also help.  If not improving greatly, stop the Aleve and start the steroid pack.  Again EKG is unchanged from prior studies. I am checking a cardiac marker just to further r/o cardiac involvement although I do not expect this to be abnormal.  If you note any chest pain, shortness of breath, or worsening symptoms, please go to the ER.

## 2016-03-01 NOTE — Progress Notes (Signed)
Patient presents to clinic today c/o left sided ear and jaw pain starting early this morning. Noted some posterior neck pain at onset of symptoms that quickly resolved. Denies chest pain, palpitations, LH/dizziness or SOB. Has hx of hypertension and DM II, both well controlled. Took an Aleve with improvement in symptoms. Went to Dentist for routine visit but states he wanted her seen here for this issue. Did not assess patient.   Past Medical History:  Diagnosis Date  . Acute bronchitis 10/31/2015  . Allergy    seasonal  . Arthritis    knee-left  . Cataract    right eye  . Depression    job/stress related  . Diabetes mellitus 52   type 2  . Diaphragmatic hernia without mention of obstruction or gangrene   . Endometriosis   . Esophageal reflux   . Esophageal stricture   . GERD (gastroesophageal reflux disease)   . Hyperlipidemia   . Hyperplastic colon polyp   . Hypertension 52  . Nonalcoholic fatty liver disease 04/28/2010   Qualifier: Diagnosis of  By: Arnoldo Morale MD, Balinda Quails   . Nonspecific abnormal results of liver function study   . Pharyngitis 02/18/2012  . Plantar fascial fibromatosis   . Preventative health care 01/22/2013  . Seasonal allergies 10/31/2015  . Sleep apnea    mouth piece per pt.  . Tubular adenoma of colon   . Unspecified sleep apnea    no c-pap    Current Outpatient Prescriptions on File Prior to Visit  Medication Sig Dispense Refill  . ASPIR-LOW 81 MG EC tablet TAKE 1 TABLET BY MOUTH DAILY 30 tablet 3  . BAYER CONTOUR TEST test strip USE AS INSTRUCTED 100 each 1  . bisoprolol-hydrochlorothiazide (ZIAC) 5-6.25 MG tablet TAKE 1 TABLET BY MOUTH DAILY. 90 tablet 1  . cyclobenzaprine (FLEXERIL) 10 MG tablet Take 1 tablet (10 mg total) by mouth at bedtime as needed for muscle spasms. 30 tablet 1  . fluticasone (FLONASE) 50 MCG/ACT nasal spray Place 2 sprays into both nostrils daily. 16 g 1  . KRILL OIL PO Take 1 tablet by mouth daily.     Marland Kitchen omeprazole (PRILOSEC)  40 MG capsule TAKE 1 CAPSULE (40 MG) BY MOUTH 2 TIMES A DAY. 60 capsule 5  . Probiotic Product (PROBIOTIC DAILY PO) Take 1 capsule by mouth daily.    . rosuvastatin (CRESTOR) 5 MG tablet 1/2 TABLET EVERY DAY 15 tablet 1   No current facility-administered medications on file prior to visit.     Allergies  Allergen Reactions  . Pravastatin Other (See Comments)    Myalgia, fatigue, memory loss  . Amoxicillin Itching    REACTION: rash  . Atorvastatin Other (See Comments)    Myalgia, fatigue, memory loss  . Codeine Nausea And Vomiting    Family History  Problem Relation Age of Onset  . Breast cancer Mother   . Hypertension Mother   . Diabetes Mother     type 2  . Other Mother     esophagus surgery  . Heart disease Father   . Hyperlipidemia Father   . Hypertension Father   . Heart attack Father     MI at age 71  . Diabetes Brother     type 2  . Heart disease Maternal Grandmother   . Heart attack Maternal Grandfather   . Hypertension Maternal Grandfather   . Stroke Paternal Grandmother   . Hypertension Sister   . Diabetes Sister   . Hypertension Brother   .  Alcohol abuse Maternal Aunt   . Colon cancer Neg Hx   . Stomach cancer Neg Hx   . Esophageal cancer Neg Hx     Social History   Social History  . Marital status: Married    Spouse name: [name]  . Number of children: 2  . 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 Smoking status: Never Smoker  . Smokeless tobacco: Never Used  . Alcohol use No  . Drug use: No  . Sexual activity: Yes     Comment: lives with husband, no dietary restrictions just watching carbs, wears seat belt   Other Topics Concern  . None   Social History Narrative  . None   Review of Systems - See HPI.  All other ROS are negative.  BP 122/77 (BP Location: Left Arm, Patient Position: Sitting, Cuff Size: Large)   Pulse 71   Temp 98.2 F (36.8 C) (Oral)   Resp 16    Ht 5\' 3"  (1.6 m)   Wt 190 lb (86.2 kg)   SpO2 98%   BMI 33.66 kg/m   Physical Exam  Constitutional: She is oriented to person, place, and time and well-developed, well-nourished, and in no distress.  HENT:  Head: Normocephalic and atraumatic.    Right Ear: Tympanic membrane normal.  Left Ear: Tympanic membrane normal.  Nose: Nose normal.  Mouth/Throat: Uvula is midline, oropharynx is clear and moist and mucous membranes are normal.  Eyes: Conjunctivae are normal.  Neck: Neck supple.  Cardiovascular: Normal rate, regular rhythm, normal heart sounds and intact distal pulses.   Pulmonary/Chest: Effort normal and breath sounds normal. No respiratory distress. She has no wheezes. She has no rales. She exhibits no tenderness.  Neurological: She is alert and oriented to person, place, and time.  Skin: Skin is warm and dry. No rash noted.  Psychiatric: Affect normal.  Vitals reviewed.   Recent Results (from the past 2160 hour(s))  Hemoglobin A1c     Status: Abnormal   Collection Time: 12/04/15  9:44 AM  Result Value Ref Range   Hgb A1c MFr Bld 7.2 (H) 4.6 - 6.5 %    Comment: Glycemic Control Guidelines for People with Diabetes:Non Diabetic:  <6%Goal of Therapy: <7%Additional Action Suggested:  >8%   TSH     Status: None   Collection Time: 12/04/15  9:44 AM  Result Value Ref Range   TSH 0.94 0.35 - 4.50 uIU/mL  CBC     Status: None   Collection Time: 12/04/15  9:44 AM  Result Value Ref Range   WBC 5.9 4.0 - 10.5 K/uL   RBC 5.04 3.87 - 5.11 Mil/uL   Platelets 251.0 150.0 - 400.0 K/uL   Hemoglobin 13.7 12.0 - 15.0 g/dL   HCT 42.0 36.0 - 46.0 %   MCV 83.4 78.0 - 100.0 fl   MCHC 32.6 30.0 - 36.0 g/dL   RDW 13.6 11.5 - 15.5 %  Lipid panel     Status: Abnormal   Collection Time: 12/04/15  9:44 AM  Result Value Ref Range   Cholesterol 226 (H) 0 - 200 mg/dL    Comment: ATP III Classification       Desirable:  < 200 mg/dL               Borderline High:  200 - 239 mg/dL          High:   > =  240 mg/dL   Triglycerides 159.0 (H) 0.0 - 149.0 mg/dL    Comment: Normal:  <150 mg/dLBorderline High:  150 - 199 mg/dL   HDL 45.20 >39.00 mg/dL   VLDL 31.8 0.0 - 40.0 mg/dL   LDL Cholesterol 149 (H) 0 - 99 mg/dL   Total CHOL/HDL Ratio 5     Comment:                Men          Women1/2 Average Risk     3.4          3.3Average Risk          5.0          4.42X Average Risk          9.6          7.13X Average Risk          15.0          11.0                       NonHDL 180.59     Comment: NOTE:  Non-HDL goal should be 30 mg/dL higher than patient's LDL goal (i.e. LDL goal of < 70 mg/dL, would have non-HDL goal of < 100 mg/dL)  Comprehensive metabolic panel     Status: Abnormal   Collection Time: 12/04/15  9:44 AM  Result Value Ref Range   Sodium 138 135 - 145 mEq/L   Potassium 4.0 3.5 - 5.1 mEq/L   Chloride 100 96 - 112 mEq/L   CO2 33 (H) 19 - 32 mEq/L   Glucose, Bld 123 (H) 70 - 99 mg/dL   BUN 14 6 - 23 mg/dL   Creatinine, Ser 0.91 0.40 - 1.20 mg/dL   Total Bilirubin 0.4 0.2 - 1.2 mg/dL   Alkaline Phosphatase 70 39 - 117 U/L   AST 20 0 - 37 U/L   ALT 31 0 - 35 U/L   Total Protein 7.3 6.0 - 8.3 g/dL   Albumin 4.1 3.5 - 5.2 g/dL   Calcium 9.9 8.4 - 10.5 mg/dL   GFR 80.24 >60.00 mL/min  Microalbumin / creatinine urine ratio     Status: None   Collection Time: 12/04/15  9:44 AM  Result Value Ref Range   Microalb, Ur <0.7 0.0 - 1.9 mg/dL   Creatinine,U 68.3 mg/dL   Microalb Creat Ratio 1.0 0.0 - 30.0 mg/g  Varicella zoster antibody, IgG     Status: Abnormal   Collection Time: 12/04/15  9:44 AM  Result Value Ref Range   Varicella IgG 1,081.00 (H) <135.00 Index    Comment:        Index           Interpretation      =====           ==============      < 135.00          Negative      135.00-164.99     Equivocal      >= 165.00         Positive   A positive result indicates that the patient has antibody to VZV but does not differentiate between an active or past infection.  The clinical diagnosis must be interpreted in conjunction with the clinical signs and symptoms of the patient. This assay reliably measures immunity due to previous infection but may not be sensitive enough to detect antibodies induced by vaccination.  Thus, a negative result in a vaccinated individual does not necessarily indicate susceptibility to VZV infection.     HM MAMMOGRAPHY     Status: None   Collection Time: 02/02/16 12:00 AM  Result Value Ref Range   HM Mammogram 0-4 Bi-Rad 0-4 Bi-Rad, Self Reported Normal    Comment: [facility]    Assessment/Plan: 1. Jaw pain Signs point to TMJ. Giving history of HTN and DM, EKG obtained revealing NSR with RBB and AF block, chronic and unchanged from prior EKG. Continue Aleve. Start Supportive measures -- Aspercreme and Cold compresses. Soft foods and avoid chewing on affected side. If no major improvement, stop Aleve and start medrol dose pack. Will check STAT troponin to further r/o any cardiac cause although unlikely. Vitals stable and patient in no distress. - EKG 12-Lead - methylPREDNISolone (MEDROL DOSEPAK) 4 MG TBPK tablet; Take following package directions  Dispense: 21 tablet; Refill: 0 - Troponin I   Leeanne Rio, PA-C

## 2016-03-03 ENCOUNTER — Ambulatory Visit (INDEPENDENT_AMBULATORY_CARE_PROVIDER_SITE_OTHER): Payer: BLUE CROSS/BLUE SHIELD | Admitting: Physician Assistant

## 2016-03-03 ENCOUNTER — Encounter: Payer: Self-pay | Admitting: Physician Assistant

## 2016-03-03 VITALS — BP 121/71 | HR 73 | Temp 98.3°F | Resp 16 | Ht 63.0 in | Wt 187.2 lb

## 2016-03-03 DIAGNOSIS — B0222 Postherpetic trigeminal neuralgia: Secondary | ICD-10-CM

## 2016-03-03 MED ORDER — LIDOCAINE VISCOUS 2 % MT SOLN: 20.0000 mL | OROMUCOSAL | 0 refills | Status: DC | PRN

## 2016-03-03 MED ORDER — VALACYCLOVIR HCL 1 G PO TABS: 1000.0000 mg | ORAL_TABLET | Freq: Three times a day (TID) | ORAL | 0 refills | Status: DC

## 2016-03-03 MED ORDER — TRAMADOL HCL 50 MG PO TABS: 50.0000 mg | ORAL_TABLET | Freq: Three times a day (TID) | ORAL | 0 refills | Status: DC | PRN

## 2016-03-03 MED FILL — traMADol HCL 50 MG TABS: 50 | 10 days supply | Qty: 30 | Fill #0

## 2016-03-03 MED FILL — valACYclovir HCL 1 GM TABS: 1 | 7 days supply | Qty: 21 | Fill #0

## 2016-03-03 NOTE — Patient Instructions (Signed)
Stop the medications started the other day. Take the Valtrex as directed to help shorten course of this viral infection. Take the pain medication as directed with food to prevent nausea. A topical Aspercreme with lidocaine can be applied to the area.  If you notice any worsening symptoms while on medication, please come see [place] ASAP or go to the ER as you may need IV antivirals.

## 2016-03-03 NOTE — Progress Notes (Signed)
Patient presents to clinic today c/o worsening L sided jaw and facial pain with temporal headache. Patient originally seen and assessed on 03/01/16, with pain in TMJ region. Cardiac assessment at that time was negative. Patient was started on treatment for TMJ dysfunction. Since that time, patient notes pain is along jaw line and lower lip. Has noted blistering rash of jaw and lower lip of left side of face. Does not affect upper lip or R side of face per patient. Denies fever, chills. Denies vision changes.   Past Medical History:  Diagnosis Date  . Acute bronchitis 63/19/2017  . Allergy    seasonal  . Arthritis    knee-left  . Cataract    right eye  . Depression    job/stress related  . Diabetes mellitus 65   type 2  . Diaphragmatic hernia without mention of obstruction or gangrene   . Endometriosis   . Esophageal reflux   . Esophageal stricture   . GERD (gastroesophageal reflux disease)   . Hyperlipidemia   . Hyperplastic colon polyp   . Hypertension 65  . Nonalcoholic fatty liver disease 04/28/2010   Qualifier: Diagnosis of  By: Arnoldo Morale MD, Balinda Quails   . Nonspecific abnormal results of liver function study   . Pharyngitis 02/18/2012  . Plantar fascial fibromatosis   . Preventative health care 01/22/2013  . Seasonal allergies 10/31/2015  . Sleep apnea    mouth piece per pt.  . Tubular adenoma of colon   . Unspecified sleep apnea    no c-pap    Current Outpatient Prescriptions on File Prior to Visit  Medication Sig Dispense Refill  . ASPIR-LOW 81 MG EC tablet TAKE 1 TABLET BY MOUTH DAILY 30 tablet 3  . BAYER CONTOUR TEST test strip USE AS INSTRUCTED 100 each 1  . bisoprolol-hydrochlorothiazide (ZIAC) 5-6.25 MG tablet TAKE 1 TABLET BY MOUTH DAILY. 90 tablet 1  . canagliflozin (INVOKANA) 300 MG TABS tablet Take 1 tablet (300 mg total) by mouth daily. 90 tablet 1  . cyclobenzaprine (FLEXERIL) 10 MG tablet Take 1 tablet (10 mg total) by mouth at bedtime as needed for muscle  spasms. 30 tablet 1  . fluticasone (FLONASE) 50 MCG/ACT nasal spray Place 2 sprays into both nostrils daily. 16 g 1  . KRILL OIL PO Take 1 tablet by mouth daily.     . metFORMIN (GLUCOPHAGE) 1000 MG tablet TAKE 1 TABLET BY MOUTH TWICE A DAY AND TAKE 1/2 TABLET AT NOON 75 tablet 2  . omeprazole (PRILOSEC) 40 MG capsule TAKE 1 CAPSULE (40 MG) BY MOUTH 2 TIMES A DAY. 60 capsule 5  . Probiotic Product (PROBIOTIC DAILY PO) Take 1 capsule by mouth daily.    . rosuvastatin (CRESTOR) 5 MG tablet 1/2 TABLET EVERY DAY 15 tablet 1  . methylPREDNISolone (MEDROL DOSEPAK) 4 MG TBPK tablet Take following package directions (Patient not taking: Reported on 03/03/2016) 21 tablet 0   No current facility-administered medications on file prior to visit.     Allergies  Allergen Reactions  . Pravastatin Other (See Comments)    Myalgia, fatigue, memory loss  . Amoxicillin Itching    REACTION: rash  . Atorvastatin Other (See Comments)    Myalgia, fatigue, memory loss  . Codeine Nausea And Vomiting    Family History  Problem Relation Age of Onset  . Breast cancer Mother   . Hypertension Mother   . Diabetes Mother     type 2  . Other Mother  esophagus surgery  . Heart disease Father   . Hyperlipidemia Father   . Hypertension Father   . Heart attack Father     MI at age 45  . Diabetes Brother     type 2  . Heart disease Maternal Grandmother   . Heart attack Maternal Grandfather   . Hypertension Maternal Grandfather   . Stroke Paternal Grandmother   . Hypertension Sister   . Diabetes Sister   . Hypertension Brother   . Alcohol abuse Maternal Aunt   . Colon cancer Neg Hx   . Stomach cancer Neg Hx   . Esophageal cancer Neg Hx     Social History   Social History  . Marital status: Married    Spouse name: [name]  . Number of children: 2  . 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 Smoking status: Never Smoker  .  Smokeless tobacco: Never Used  . Alcohol use No  . Drug use: No  . Sexual activity: Yes     Comment: lives with husband, no dietary restrictions just watching carbs, wears seat belt   Other Topics Concern  . None   Social History Narrative  . None    Review of Systems - See HPI.  All other ROS are negative.  BP 121/71 (BP Location: Left Arm, Patient Position: Sitting, Cuff Size: Large)   Pulse 73   Temp 98.3 F (36.8 C) (Oral)   Resp 16   Ht 5\' 3"  (1.6 m)   Wt 187 lb 4 oz (84.9 kg)   SpO2 97%   BMI 33.17 kg/m   Physical Exam  Constitutional: She is well-developed, well-nourished, and in no distress.  HENT:  Head: Normocephalic and atraumatic.    Right Ear: External ear normal.  Left Ear: External ear normal.  Eyes: Conjunctivae are normal. Pupils are equal, round, and reactive to light.  Neck: Neck supple.  Cardiovascular: Normal rate, regular rhythm, normal heart sounds and intact distal pulses.   Pulmonary/Chest: Effort normal and breath sounds normal. No respiratory distress. She has no wheezes. She has no rales. She exhibits no tenderness.  Lymphadenopathy:    She has no cervical adenopathy.  Skin: Skin is warm and dry.  Psychiatric: Affect normal.  Vitals reviewed.   Recent Results (from the past 2160 hour(s))  HM MAMMOGRAPHY     Status: None   Collection Time: 02/02/16 12:00 AM  Result Value Ref Range   HM Mammogram 0-4 Bi-Rad 0-4 Bi-Rad, Self Reported Normal    Comment: [facility]  Troponin I     Status: None   Collection Time: 03/01/16  2:26 PM  Result Value Ref Range   TNIDX 0.01 0.00 - 0.06 ug/l    Assessment/Plan: 1. Trigeminal herpes zoster Course of symptoms with prodrome of pain preceding eruption of vesicular rash in the left V3 dermatome is consistent with trigeminal herpes zoster. Rx Valtrex 1000 mg TID x 7 days. Pain medication given. Supportive measures reviewed. Alarm signs/symptoms discussed that would prompt ER assessment. Patient voices  understanding and agreement with plan.  - valACYclovir (VALTREX) 1000 MG tablet; Take 1 tablet (1,000 mg total) by mouth 3 (three) times daily.  Dispense: 21 tablet; Refill: 0   Leeanne Rio, [place]

## 2016-03-03 NOTE — Progress Notes (Signed)
Pre visit review using our clinic review tool, if applicable. No additional management support is needed unless otherwise documented below in the visit note/SLS  

## 2016-03-05 ENCOUNTER — Ambulatory Visit (INDEPENDENT_AMBULATORY_CARE_PROVIDER_SITE_OTHER): Payer: BLUE CROSS/BLUE SHIELD | Admitting: Family Medicine

## 2016-03-05 ENCOUNTER — Encounter: Payer: Self-pay | Admitting: Family Medicine

## 2016-03-05 VITALS — BP 128/60 | HR 88 | Temp 98.2°F | Ht 63.0 in | Wt 188.4 lb

## 2016-03-05 DIAGNOSIS — G500 Trigeminal neuralgia: Secondary | ICD-10-CM | POA: Diagnosis not present

## 2016-03-05 MED ORDER — GABAPENTIN 300 MG PO CAPS: 300.0000 mg | ORAL_CAPSULE | Freq: Three times a day (TID) | ORAL | 3 refills | Status: DC

## 2016-03-05 MED FILL — carBAMazepine 200 MG TABS: 200 | 14 days supply | Qty: 56 | Fill #0

## 2016-03-05 MED FILL — GABAPENTIN 300 MG CAPSULE: 300 | 30 days supply | Qty: 90 | Fill #0

## 2016-03-05 NOTE — Progress Notes (Signed)
Pre visit review using our clinic review tool, if applicable. No additional management support is needed unless otherwise documented below in the visit note. 

## 2016-03-05 NOTE — Progress Notes (Signed)
Chief Complaint  Patient presents with  . ER follow-up    pt seen in ER last night for ear pain    Subjective: Patient is a 65 y.o. female here for ER f/u.  Dx'd with trigeminal neuralgia and placed on Carbamazepine. She received a dose in the emergency department yesterday but has not started her home prescription yet. She notes that it did make her feel much better. She does not notice any side effects from her single dose. She is still taking the valacyclovir that was prescribed on Wednesday. She's not having any issues with this medication. She is still having some pain on the left side near her jaw. Denies any fevers or any drainage in her mouth. No balance issues, numbness, tingling, or weakness.  ROS: Neuro: As noted in HPI MSK: No difficulty opening jaw  Family History  Problem Relation Age of Onset  . Breast cancer Mother   . Hypertension Mother   . Diabetes Mother     type 2  . Other Mother     esophagus surgery  . Heart disease Father   . Hyperlipidemia Father   . Hypertension Father   . Heart attack Father     MI at age 108  . Diabetes Brother     type 2  . Heart disease Maternal Grandmother   . Heart attack Maternal Grandfather   . Hypertension Maternal Grandfather   . Stroke Paternal Grandmother   . Hypertension Sister   . Diabetes Sister   . Hypertension Brother   . Alcohol abuse Maternal Aunt   . Colon cancer Neg Hx   . Stomach cancer Neg Hx   . Esophageal cancer Neg Hx    Past Medical History:  Diagnosis Date  . Acute bronchitis 10/31/2015  . Allergy    seasonal  . Arthritis    knee-left  . Cataract    right eye  . Depression    job/stress related  . Diabetes mellitus 52   type 2  . Diaphragmatic hernia without mention of obstruction or gangrene   . Endometriosis   . Esophageal reflux   . Esophageal stricture   . GERD (gastroesophageal reflux disease)   . Hyperlipidemia   . Hyperplastic colon polyp   . Hypertension 52  . Nonalcoholic fatty  liver disease 04/28/2010   Qualifier: Diagnosis of  By: Arnoldo Morale MD, Balinda Quails   . Nonspecific abnormal results of liver function study   . Pharyngitis 02/18/2012  . Plantar fascial fibromatosis   . Preventative health care 01/22/2013  . Seasonal allergies 10/31/2015  . Sleep apnea    mouth piece per pt.  . Tubular adenoma of colon   . Unspecified sleep apnea    no c-pap   Allergies  Allergen Reactions  . Pravastatin Other (See Comments)    Myalgia, fatigue, memory loss  . Amoxicillin Itching    REACTION: rash  . Atorvastatin Other (See Comments)    Myalgia, fatigue, memory loss  . Codeine Nausea And Vomiting    Current Outpatient Prescriptions:  .  carbamazepine (TEGRETOL) 200 MG tablet, Take 2 tablets by mouth 2 (two) times daily., Disp: , Rfl:  .  ASPIR-LOW 81 MG EC tablet, TAKE 1 TABLET BY MOUTH DAILY, Disp: 30 tablet, Rfl: 3 .  BAYER CONTOUR TEST test strip, USE AS INSTRUCTED, Disp: 100 each, Rfl: 1 .  bisoprolol-hydrochlorothiazide (ZIAC) 5-6.25 MG tablet, TAKE 1 TABLET BY MOUTH DAILY., Disp: 90 tablet, Rfl: 1 .  canagliflozin (INVOKANA) 300 MG TABS  tablet, Take 1 tablet (300 mg total) by mouth daily., Disp: 90 tablet, Rfl: 1 .  cyclobenzaprine (FLEXERIL) 10 MG tablet, Take 1 tablet (10 mg total) by mouth at bedtime as needed for muscle spasms., Disp: 30 tablet, Rfl: 1 .  fluticasone (FLONASE) 50 MCG/ACT nasal spray, Place 2 sprays into both nostrils daily., Disp: 16 g, Rfl: 1 .  gabapentin (NEURONTIN) 300 MG capsule, Take 1 capsule (300 mg total) by mouth 3 (three) times daily. Start with 1 tab nightly and increase a tab every 4 days., Disp: 90 capsule, Rfl: 3 .  KRILL OIL PO, Take 1 tablet by mouth daily. , Disp: , Rfl:  .  lidocaine (XYLOCAINE) 2 % solution, Use as directed 20 mLs in the mouth or throat as needed for mouth pain., Disp: 100 mL, Rfl: 0 .  metFORMIN (GLUCOPHAGE) 1000 MG tablet, TAKE 1 TABLET BY MOUTH TWICE A DAY AND TAKE 1/2 TABLET AT NOON, Disp: 75 tablet, Rfl: 2 .   omeprazole (PRILOSEC) 40 MG capsule, TAKE 1 CAPSULE (40 MG) BY MOUTH 2 TIMES A DAY., Disp: 60 capsule, Rfl: 5 .  Probiotic Product (PROBIOTIC DAILY PO), Take 1 capsule by mouth daily., Disp: , Rfl:  .  rosuvastatin (CRESTOR) 5 MG tablet, 1/2 TABLET EVERY DAY, Disp: 15 tablet, Rfl: 1 .  traMADol (ULTRAM) 50 MG tablet, Take 1 tablet (50 mg total) by mouth every 8 (eight) hours as needed., Disp: 30 tablet, Rfl: 0 .  valACYclovir (VALTREX) 1000 MG tablet, Take 1 tablet (1,000 mg total) by mouth 3 (three) times daily., Disp: 21 tablet, Rfl: 0  Objective: BP 128/60 (BP Location: Left Arm, Patient Position: Sitting, Cuff Size: Normal)   Pulse 88   Temp 98.2 F (36.8 C) (Oral)   Ht 5\' 3"  (1.6 m)   Wt 188 lb 6.4 oz (85.5 kg)   SpO2 98%   BMI 33.37 kg/m  General: Awake, appears stated age Skin: There is a cluster of erythematous and raised papules on the left side of her distal mandible.  Heart: RRR, no murmurs Lungs: CTAB, no rales, wheezes or rhonchi. Normal effort Neuro: Biceps/patellar/calcaneal-1/4 reflex bilaterally without clonus; no cerebellar signs MSK: 5/5 strength throughout, gait normal, no atrophy or asymmetry; tenderness to palpation over the TMJ region Psych: Age appropriate judgment and insight, normal affect and mood   Assessment and Plan: Trigeminal neuralgia - Plan: gabapentin (NEURONTIN) 300 MG capsule  Etiology of the above is likely from her shingles. Her neuro exam is normal, no head imaging warranted at this time. Add Neurontin-start with 1 capsule at night for 4 days, then add one in the morning for 4 Days, and finally take It 3 Times Daily. If she does very well with the carbamazepine by itself, she does not need to use the Neurontin. Continue with the valacyclovir. Follow-up as needed. The patient voiced understanding and agreement to the plan.  [place], DO 03/05/16  11:24 AM

## 2016-03-05 NOTE — Patient Instructions (Addendum)
Trigeminal Neuralgia °Trigeminal neuralgia is a nerve disorder that causes attacks of severe facial pain. The attacks last from a few seconds to several minutes. They can happen for days, weeks, or months and then go away for months or years. Trigeminal neuralgia is also called tic douloureux. °CAUSES °This condition is caused by damage to a nerve in the face that is called the trigeminal nerve. An attack can be triggered by: °· Talking. °· Chewing. °· Putting on makeup. °· Washing your face. °· Shaving your face. °· Brushing your teeth. °· Touching your face. °RISK FACTORS °This condition is more likely to develop in: °· Women. °· People who are 50 years of age or older. °SYMPTOMS °The main symptom of this condition is pain in the jaw, lips, eyes, nose, scalp, forehead, and face. The pain may be intense, stabbing, electric, or shock-like. °DIAGNOSIS °This condition is diagnosed with a physical exam. A CT scan or MRI may be done to rule out other conditions that can cause facial pain. °TREATMENT °This condition may be treated with: °· Avoiding the things that trigger your attacks. °· Pain medicine. °· Surgery. This may be done in severe cases if other medical treatment does not provide relief. °HOME CARE INSTRUCTIONS °· Take over-the-counter and prescription medicines only as told by your health care provider. °· If you wish to get pregnant, talk with your health care provider before you start trying to get pregnant. °· Avoid the things that trigger your attacks. It may help to: °¨ Chew on the unaffected side of your mouth. °¨ Avoid touching your face. °¨ Avoid blasts of hot or cold air. °SEEK MEDICAL CARE IF: °· Your pain medicine is not helping. °· You develop new, unexplained symptoms, such as: °¨ Double vision. °¨ Facial weakness. °¨ Changes in hearing or balance. °· You become pregnant. °SEEK IMMEDIATE MEDICAL CARE IF: °· Your pain is unbearable, and your pain medicine does not help. °  °This information is not  intended to replace advice given to you by your health care provider. Make sure you discuss any questions you have with your health care provider. °  °Document Released: 05/28/2000 Document Revised: 02/19/2015 Document Reviewed: 09/23/2014 °Elsevier Interactive Patient Education ©2016 [employer] ° °

## 2016-03-11 ENCOUNTER — Encounter: Payer: Self-pay | Admitting: Family Medicine

## 2016-03-11 ENCOUNTER — Ambulatory Visit (INDEPENDENT_AMBULATORY_CARE_PROVIDER_SITE_OTHER): Payer: BLUE CROSS/BLUE SHIELD | Admitting: Family Medicine

## 2016-03-11 VITALS — BP 110/70 | HR 68 | Temp 98.0°F | Ht 63.0 in | Wt 186.1 lb

## 2016-03-11 DIAGNOSIS — B0222 Postherpetic trigeminal neuralgia: Secondary | ICD-10-CM

## 2016-03-11 DIAGNOSIS — I10 Essential (primary) hypertension: Secondary | ICD-10-CM

## 2016-03-11 DIAGNOSIS — E669 Obesity, unspecified: Secondary | ICD-10-CM

## 2016-03-11 DIAGNOSIS — E1169 Type 2 diabetes mellitus with other specified complication: Secondary | ICD-10-CM

## 2016-03-11 DIAGNOSIS — E782 Mixed hyperlipidemia: Secondary | ICD-10-CM | POA: Diagnosis not present

## 2016-03-11 DIAGNOSIS — E119 Type 2 diabetes mellitus without complications: Secondary | ICD-10-CM | POA: Diagnosis not present

## 2016-03-11 LAB — COMPREHENSIVE METABOLIC PANEL
ALBUMIN: 4.1 g/dL (ref 3.5–5.2)
ALK PHOS: 76 U/L (ref 39–117)
ALT: 32 U/L (ref 0–35)
AST: 23 U/L (ref 0–37)
BILIRUBIN TOTAL: 0.3 mg/dL (ref 0.2–1.2)
BUN: 15 mg/dL (ref 6–23)
CO2: 29 mEq/L (ref 19–32)
CREATININE: 0.83 mg/dL (ref 0.40–1.20)
Calcium: 9.5 mg/dL (ref 8.4–10.5)
Chloride: 101 mEq/L (ref 96–112)
GFR: 89.15 mL/min (ref >60.00)
Glucose, Bld: 113 mg/dL — ABNORMAL HIGH (ref 70–99)
Potassium: 4.1 mEq/L (ref 3.5–5.1)
SODIUM: 139 meq/L (ref 135–145)
TOTAL PROTEIN: 7.8 g/dL (ref 6.0–8.3)

## 2016-03-11 LAB — CBC
HEMATOCRIT: 43.2 % (ref 36.0–46.0)
Hemoglobin: 14.4 g/dL (ref 12.0–15.0)
MCHC: 33.3 g/dL (ref 30.0–36.0)
MCV: 80.4 fl (ref 78.0–100.0)
Platelets: 269 10*3/uL (ref 150.0–400.0)
RBC: 5.38 Mil/uL — AB (ref 3.87–5.11)
RDW: 13.8 % (ref 11.5–15.5)
WBC: 6.3 10*3/uL (ref 4.0–10.5)

## 2016-03-11 LAB — LIPID PANEL
CHOLESTEROL: 207 mg/dL — AB (ref 0–200)
HDL: 46.2 mg/dL (ref >39.00)
LDL Cholesterol: 132 mg/dL — ABNORMAL HIGH (ref 0–99)
NONHDL: 160.7
Total CHOL/HDL Ratio: 4
Triglycerides: 145 mg/dL (ref 0.0–149.0)
VLDL: 29 mg/dL (ref 0.0–40.0)

## 2016-03-11 LAB — HEMOGLOBIN A1C: HEMOGLOBIN A1C: 7.4 % — AB (ref 4.6–6.5)

## 2016-03-11 LAB — TSH: TSH: 1 u[IU]/mL (ref 0.35–4.50)

## 2016-03-11 MED ORDER — VALACYCLOVIR HCL 1 G PO TABS: 1000.0000 mg | ORAL_TABLET | Freq: Three times a day (TID) | ORAL | 0 refills | Status: DC

## 2016-03-11 MED FILL — valACYclovir HCL 1 GM TABS: 1 | 7 days supply | Qty: 21 | Fill #0

## 2016-03-11 NOTE — Patient Instructions (Signed)

## 2016-03-11 NOTE — Progress Notes (Signed)
Pre visit review using our clinic review tool, if applicable. No additional management support is needed unless otherwise documented below in the visit note. 

## 2016-03-11 NOTE — Progress Notes (Signed)
Patient ID: [name], female   DOB: 02-15-1951, 65 y.o.   MRN: [id number]   Subjective:    Patient ID: [name], female    DOB: [birth year], 65 y.o.   MRN: [id number]  Chief Complaint  Patient presents with  . Follow-up    HPI Patient is in today for follow up. She has just suffered through the death of her brother who had been living at time of death. She is frustrated with anhedonia, fatigue and weight gain. No polyuria or polydispia. Denies CP/palp/SOB/HA/congestion/fevers/GI or GU c/o. Taking meds as prescribed  Past Medical History:  Diagnosis Date  . Acute bronchitis 10/31/2015  . Allergy    seasonal  . Arthritis    knee-left  . Cataract    right eye  . Depression    job/stress related  . Diabetes mellitus 52   type 2  . Diaphragmatic hernia without mention of obstruction or gangrene   . Endometriosis   . Esophageal reflux   . Esophageal stricture   . GERD (gastroesophageal reflux disease)   . Hyperlipidemia   . Hyperplastic colon polyp   . Hypertension 52  . Nonalcoholic fatty liver disease 04/28/2010   Qualifier: Diagnosis of  By: Arnoldo Morale MD, Balinda Quails   . Nonspecific abnormal results of liver function study   . Pharyngitis 02/18/2012  . Plantar fascial fibromatosis   . Preventative health care 01/22/2013  . Seasonal allergies 10/31/2015  . Sleep apnea    mouth piece per pt.  . Tubular adenoma of colon   . Unspecified sleep apnea    no c-pap    Past Surgical History:  Procedure Laterality Date  . ABDOMINAL HYSTERECTOMY  1982   partial  . APPENDECTOMY    . 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 BREAST CYST INCISION AND DRAINAGE     right breast  . CATARACT EXTRACTION     right eye  . HERNIA REPAIR     per pt, she is not aware of this surgery  . seba    . TUBAL LIGATION      Family History  Problem Relation Age of Onset  . Breast cancer Mother   . Hypertension Mother   . Diabetes Mother     type 2  . Other Mother     esophagus surgery  .  Heart disease Father   . Hyperlipidemia Father   . Hypertension Father   . Heart attack Father     MI at age 65  . Diabetes Brother     type 2  . Heart disease Maternal Grandmother   . Heart attack Maternal Grandfather   . Hypertension Maternal Grandfather   . Stroke Paternal Grandmother   . Hypertension Sister   . Diabetes Sister   . Hypertension Brother   . Alcohol abuse Maternal Aunt   . Colon cancer Neg Hx   . Stomach cancer Neg Hx   . Esophageal cancer Neg Hx     Social History   Social History  . Marital status: Married    Spouse name: [name]  . Number of children: 2  . 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 Smoking status: Never Smoker  . Smokeless tobacco: Never Used  . Alcohol use No  . Drug use: No  . Sexual activity: Yes     Comment: lives with husband, no dietary restrictions just watching carbs, wears  seat belt   Other Topics Concern  . Not on file   Social History Narrative  . No narrative on file    Outpatient Medications Prior to Visit  Medication Sig Dispense Refill  . ASPIR-LOW 81 MG EC tablet TAKE 1 TABLET BY MOUTH DAILY 30 tablet 3  . BAYER CONTOUR TEST test strip USE AS INSTRUCTED 100 each 1  . bisoprolol-hydrochlorothiazide (ZIAC) 5-6.25 MG tablet TAKE 1 TABLET BY MOUTH DAILY. 90 tablet 1  . canagliflozin (INVOKANA) 300 MG TABS tablet Take 1 tablet (300 mg total) by mouth daily. 90 tablet 1  . carbamazepine (TEGRETOL) 200 MG tablet Take 2 tablets by mouth 2 (two) times daily.    . cyclobenzaprine (FLEXERIL) 10 MG tablet Take 1 tablet (10 mg total) by mouth at bedtime as needed for muscle spasms. 30 tablet 1  . fluticasone (FLONASE) 50 MCG/ACT nasal spray Place 2 sprays into both nostrils daily. 16 g 1  . gabapentin (NEURONTIN) 300 MG capsule Take 1 capsule (300 mg total) by mouth 3 (three) times daily. Start with 1 tab nightly and increase a tab every 4 days. 90 capsule 3   . KRILL OIL PO Take 1 tablet by mouth daily.     Marland Kitchen lidocaine (XYLOCAINE) 2 % solution Use as directed 20 mLs in the mouth or throat as needed for mouth pain. 100 mL 0  . metFORMIN (GLUCOPHAGE) 1000 MG tablet TAKE 1 TABLET BY MOUTH TWICE A DAY AND TAKE 1/2 TABLET AT NOON 75 tablet 2  . omeprazole (PRILOSEC) 40 MG capsule TAKE 1 CAPSULE (40 MG) BY MOUTH 2 TIMES A DAY. 60 capsule 5  . Probiotic Product (PROBIOTIC DAILY PO) Take 1 capsule by mouth daily.    . rosuvastatin (CRESTOR) 5 MG tablet 1/2 TABLET EVERY DAY 15 tablet 1  . traMADol (ULTRAM) 50 MG tablet Take 1 tablet (50 mg total) by mouth every 8 (eight) hours as needed. 30 tablet 0  . valACYclovir (VALTREX) 1000 MG tablet Take 1 tablet (1,000 mg total) by mouth 3 (three) times daily. 21 tablet 0   No facility-administered medications prior to visit.     Allergies  Allergen Reactions  . Pravastatin Other (See Comments)    Myalgia, fatigue, memory loss  . Amoxicillin Itching    REACTION: rash  . Atorvastatin Other (See Comments)    Myalgia, fatigue, memory loss  . Codeine Nausea And Vomiting    ROS     Objective:    Physical Exam  BP 110/70 (BP Location: Left Arm, Patient Position: Sitting, Cuff Size: Normal)   Pulse 68   Temp 98 F (36.7 C) (Oral)   Ht 5\' 3"  (1.6 m)   Wt 186 lb 2 oz (84.4 kg)   BMI 32.97 kg/m  Wt Readings from Last 3 Encounters:  03/11/16 186 lb 2 oz (84.4 kg)  03/05/16 188 lb 6.4 oz (85.5 kg)  03/03/16 187 lb 4 oz (84.9 kg)     Lab Results  Component Value Date   WBC 6.3 03/11/2016   HGB 14.4 03/11/2016   HCT 43.2 03/11/2016   PLT 269.0 03/11/2016   GLUCOSE 113 (H) 03/11/2016   CHOL 207 (H) 03/11/2016   TRIG 145.0 03/11/2016   HDL 46.20 03/11/2016   LDLCALC 132 (H) 03/11/2016   ALT 32 03/11/2016   AST 23 03/11/2016   NA 139 03/11/2016   K 4.1 03/11/2016   CL 101 03/11/2016   CREATININE 0.83 03/11/2016   BUN 15 03/11/2016   CO2  29 03/11/2016   TSH 1.00 03/11/2016   HGBA1C 7.4 (H)  03/11/2016   MICROALBUR <0.7 12/04/2015    Lab Results  Component Value Date   TSH 1.00 03/11/2016   Lab Results  Component Value Date   WBC 6.3 03/11/2016   HGB 14.4 03/11/2016   HCT 43.2 03/11/2016   MCV 80.4 03/11/2016   PLT 269.0 03/11/2016   Lab Results  Component Value Date   NA 139 03/11/2016   K 4.1 03/11/2016   CO2 29 03/11/2016   GLUCOSE 113 (H) 03/11/2016   BUN 15 03/11/2016   CREATININE 0.83 03/11/2016   BILITOT 0.3 03/11/2016   ALKPHOS 76 03/11/2016   AST 23 03/11/2016   ALT 32 03/11/2016   PROT 7.8 03/11/2016   ALBUMIN 4.1 03/11/2016   CALCIUM 9.5 03/11/2016   ANIONGAP 16 (H) 12/19/2013   GFR 89.15 03/11/2016   Lab Results  Component Value Date   CHOL 207 (H) 03/11/2016   Lab Results  Component Value Date   HDL 46.20 03/11/2016   Lab Results  Component Value Date   LDLCALC 132 (H) 03/11/2016   Lab Results  Component Value Date   TRIG 145.0 03/11/2016   Lab Results  Component Value Date   CHOLHDL 4 03/11/2016   Lab Results  Component Value Date   HGBA1C 7.4 (H) 03/11/2016       Assessment & Plan:   Problem List Items Addressed This Visit    Diabetes mellitus type 2 in obese ([place]) - Primary    hgba1c acceptable, minimize simple carbs. Increase exercise as tolerated. Continue current meds      Relevant Orders   Hemoglobin A1c (Completed)   Hyperlipidemia, mixed    Encouraged heart healthy diet, increase exercise, avoid trans fats, consider a krill oil cap daily. Tolerating Crestor      Relevant Orders   Lipid panel (Completed)   Essential hypertension   Relevant Orders   TSH (Completed)   CBC (Completed)   Comprehensive metabolic panel (Completed)    Other Visit Diagnoses    Trigeminal herpes zoster       Relevant Medications   valACYclovir (VALTREX) 1000 MG tablet      I am having Ms. Muckey maintain her BAYER CONTOUR TEST, Probiotic Product (PROBIOTIC DAILY PO), KRILL OIL PO, ASPIR-LOW, cyclobenzaprine,  bisoprolol-hydrochlorothiazide, fluticasone, rosuvastatin, omeprazole, metFORMIN, canagliflozin, traMADol, lidocaine, carbamazepine, gabapentin, and valACYclovir.  Meds ordered this encounter  Medications  . valACYclovir (VALTREX) 1000 MG tablet    Sig: Take 1 tablet (1,000 mg total) by mouth 3 (three) times daily.    Dispense:  21 tablet    Refill:  0     [name], MD

## 2016-03-11 NOTE — Assessment & Plan Note (Signed)
Encouraged heart healthy diet, increase exercise, avoid trans fats, consider a krill oil cap daily. Tolerating Crestor 

## 2016-03-11 NOTE — Assessment & Plan Note (Signed)
hgba1c acceptable, minimize simple carbs. Increase exercise as tolerated. Continue current meds 

## 2016-04-02 ENCOUNTER — Other Ambulatory Visit: Payer: Self-pay | Admitting: Family Medicine

## 2016-04-02 MED FILL — BISOPROLOL-HCTZ 5-6.25 MG T: 5-6.25 | 90 days supply | Qty: 90 | Fill #0

## 2016-04-02 MED FILL — ROSUVASTATIN CALCIUM 5 MG T: 5 | 30 days supply | Qty: 15 | Fill #0

## 2016-04-02 MED FILL — INVOKANA 300 MG TABLET: 300 | 30 days supply | Qty: 30 | Fill #1

## 2016-04-06 MED FILL — OMEPRAZOLE DR 40 MG CAPSULE: 40 | 30 days supply | Qty: 60 | Fill #1

## 2016-04-06 MED FILL — metFORMIN HCL 1000 MG TABS: 1000 | 30 days supply | Qty: 75 | Fill #1

## 2016-05-12 MED FILL — INVOKANA 300 MG TABLET: 300 | 30 days supply | Qty: 30 | Fill #2

## 2016-05-12 MED FILL — metFORMIN HCL 1000 MG TABS: 1000 | 30 days supply | Qty: 75 | Fill #2

## 2016-05-12 MED FILL — OMEPRAZOLE DR 40 MG CAPSULE: 40 | 30 days supply | Qty: 60 | Fill #2

## 2016-06-04 ENCOUNTER — Encounter: Payer: BLUE CROSS/BLUE SHIELD | Admitting: Family Medicine

## 2016-06-11 ENCOUNTER — Other Ambulatory Visit: Payer: Self-pay | Admitting: Family Medicine

## 2016-06-11 MED FILL — INVOKANA 300 MG TABLET: 300 | 30 days supply | Qty: 30 | Fill #3

## 2016-06-11 MED FILL — metFORMIN HCL 1000 MG TABS: 1000 | 30 days supply | Qty: 75 | Fill #0

## 2016-06-11 MED FILL — OMEPRAZOLE DR 40 MG CAPSULE: 40 | 30 days supply | Qty: 60 | Fill #3

## 2016-06-25 ENCOUNTER — Other Ambulatory Visit: Payer: Self-pay | Admitting: Family Medicine

## 2016-06-25 MED ORDER — BISOPROLOL-HYDROCHLOROTHIAZIDE 5-6.25 MG PO TABS: 1.0000 | ORAL_TABLET | Freq: Every day | ORAL | 0 refills | Status: DC

## 2016-07-09 ENCOUNTER — Other Ambulatory Visit (HOSPITAL_COMMUNITY)
Admission: RE | Admit: 2016-07-09 | Discharge: 2016-07-09 | Disposition: A | Discharge status: Home / Self Care | Payer: BLUE CROSS/BLUE SHIELD | Source: Ambulatory Visit | Attending: Family Medicine | Admitting: Family Medicine

## 2016-07-09 ENCOUNTER — Encounter: Payer: Self-pay | Admitting: Family Medicine

## 2016-07-09 ENCOUNTER — Ambulatory Visit (INDEPENDENT_AMBULATORY_CARE_PROVIDER_SITE_OTHER): Payer: BLUE CROSS/BLUE SHIELD | Admitting: Family Medicine

## 2016-07-09 VITALS — BP 110/60 | HR 68 | Temp 97.8°F | Ht 63.0 in | Wt 189.0 lb

## 2016-07-09 DIAGNOSIS — E669 Obesity, unspecified: Secondary | ICD-10-CM

## 2016-07-09 DIAGNOSIS — G4733 Obstructive sleep apnea (adult) (pediatric): Secondary | ICD-10-CM

## 2016-07-09 DIAGNOSIS — F418 Other specified anxiety disorders: Secondary | ICD-10-CM

## 2016-07-09 DIAGNOSIS — Z124 Encounter for screening for malignant neoplasm of cervix: Secondary | ICD-10-CM

## 2016-07-09 DIAGNOSIS — Z01419 Encounter for gynecological examination (general) (routine) without abnormal findings: Secondary | ICD-10-CM | POA: Insufficient documentation

## 2016-07-09 DIAGNOSIS — R1084 Generalized abdominal pain: Secondary | ICD-10-CM

## 2016-07-09 DIAGNOSIS — R350 Frequency of micturition: Secondary | ICD-10-CM | POA: Diagnosis not present

## 2016-07-09 DIAGNOSIS — E782 Mixed hyperlipidemia: Secondary | ICD-10-CM | POA: Diagnosis not present

## 2016-07-09 DIAGNOSIS — E1169 Type 2 diabetes mellitus with other specified complication: Secondary | ICD-10-CM | POA: Diagnosis not present

## 2016-07-09 DIAGNOSIS — I10 Essential (primary) hypertension: Secondary | ICD-10-CM

## 2016-07-09 DIAGNOSIS — Z0000 Encounter for general adult medical examination without abnormal findings: Secondary | ICD-10-CM

## 2016-07-09 LAB — COMPREHENSIVE METABOLIC PANEL
ALT: 44 U/L — AB (ref 0–35)
AST: 26 U/L (ref 0–37)
Albumin: 4.4 g/dL (ref 3.5–5.2)
Alkaline Phosphatase: 76 U/L (ref 39–117)
BUN: 17 mg/dL (ref 6–23)
CO2: 30 meq/L (ref 19–32)
Calcium: 10.2 mg/dL (ref 8.4–10.5)
Chloride: 100 mEq/L (ref 96–112)
Creatinine, Ser: 0.96 mg/dL (ref 0.40–1.20)
GFR: 75.29 mL/min (ref >60.00)
GLUCOSE: 138 mg/dL — AB (ref 70–99)
POTASSIUM: 4.1 meq/L (ref 3.5–5.1)
SODIUM: 138 meq/L (ref 135–145)
Total Bilirubin: 0.4 mg/dL (ref 0.2–1.2)
Total Protein: 7.8 g/dL (ref 6.0–8.3)

## 2016-07-09 LAB — POCT URINALYSIS DIPSTICK
Bilirubin, UA: NEGATIVE
KETONES UA: NEGATIVE
Leukocytes, UA: NEGATIVE
Nitrite, UA: NEGATIVE
PROTEIN UA: NEGATIVE
RBC UA: NEGATIVE
Spec Grav, UA: 1.02
UROBILINOGEN UA: NEGATIVE
pH, UA: 6

## 2016-07-09 LAB — LIPID PANEL
Cholesterol: 238 mg/dL — ABNORMAL HIGH (ref 0–200)
HDL: 46.1 mg/dL (ref >39.00)
LDL Cholesterol: 161 mg/dL — ABNORMAL HIGH (ref 0–99)
NonHDL: 191.92
TRIGLYCERIDES: 155 mg/dL — AB (ref 0.0–149.0)
Total CHOL/HDL Ratio: 5
VLDL: 31 mg/dL (ref 0.0–40.0)

## 2016-07-09 LAB — CBC
HCT: 42.9 % (ref 36.0–46.0)
HEMOGLOBIN: 14.3 g/dL (ref 12.0–15.0)
MCHC: 33.4 g/dL (ref 30.0–36.0)
MCV: 82 fl (ref 78.0–100.0)
Platelets: 260 10*3/uL (ref 150.0–400.0)
RBC: 5.23 Mil/uL — ABNORMAL HIGH (ref 3.87–5.11)
RDW: 13.9 % (ref 11.5–15.5)
WBC: 7.1 10*3/uL (ref 4.0–10.5)

## 2016-07-09 LAB — TSH: TSH: 1.29 u[IU]/mL (ref 0.35–4.50)

## 2016-07-09 MED ORDER — HYOSCYAMINE SULFATE 0.125 MG PO TABS: 0.1250 mg | ORAL_TABLET | Freq: Four times a day (QID) | ORAL | 0 refills | Status: DC | PRN

## 2016-07-09 MED FILL — OSCIMIN 0.125 MG TABLET: 0.125 | 8 days supply | Qty: 30 | Fill #0

## 2016-07-09 NOTE — Progress Notes (Signed)
Subjective:    Patient ID: [name], female    DOB: 06-Aug-1950, 66 y.o.   MRN: [id number]  Chief Complaint  Patient presents with  . Gynecologic Exam    HPI Patient is in today for annual gynecological examination. Patient is also following up on diabetes mellitus, hypertension, GERD, hyperlipidemia. Patient also has a complaint of urinary discomfort. No additional concerns noted. She denies any hematuria, flank pain or urinary urgency. She denies any recent febrile illness or acute concerns otherwise. No recent hospitalizations. Denies CP/palp/SOB/HA/congestion/fevers/GI or GU c/o. Taking meds as prescribed  I acted as a Education administrator for Dr. Charlett Blake. Raiford Noble, [place]   Past Medical History:  Diagnosis Date  . Acute bronchitis 10/31/2015  . Allergy    seasonal  . Arthritis    knee-left  . Cataract    right eye  . Depression    job/stress related  . Diabetes mellitus 52   type 2  . Diaphragmatic hernia without mention of obstruction or gangrene   . Endometriosis   . Esophageal reflux   . Esophageal stricture   . GERD (gastroesophageal reflux disease)   . Hyperlipidemia   . Hyperplastic colon polyp   . Hypertension 52  . Nonalcoholic fatty liver disease 04/28/2010   Qualifier: Diagnosis of  By: Arnoldo Morale MD, Balinda Quails   . Nonspecific abnormal results of liver function study   . Pharyngitis 02/18/2012  . Plantar fascial fibromatosis   . Preventative health care 01/22/2013  . Seasonal allergies 10/31/2015  . Sleep apnea    mouth piece per pt.  . Tubular adenoma of colon   . Unspecified sleep apnea    no c-pap    Past Surgical History:  Procedure Laterality Date  . ABDOMINAL HYSTERECTOMY  1982   partial  . APPENDECTOMY    . 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 BREAST CYST INCISION AND DRAINAGE     right breast  . CATARACT EXTRACTION     right eye  . HERNIA REPAIR     per pt, she is not aware of this surgery  . seba    . TUBAL LIGATION      Family History  Problem  Relation Age of Onset  . Breast cancer Mother   . Hypertension Mother   . Diabetes Mother     type 2  . Other Mother     esophagus surgery  . Heart disease Father   . Hyperlipidemia Father   . Hypertension Father   . Heart attack Father     MI at age 60  . Diabetes Brother     type 2  . Heart disease Maternal Grandmother   . Heart attack Maternal Grandfather   . Hypertension Maternal Grandfather   . Stroke Paternal Grandmother   . Hypertension Sister   . Diabetes Sister   . Hypertension Brother   . Alcohol abuse Maternal Aunt   . Colon cancer Neg Hx   . Stomach cancer Neg Hx   . Esophageal cancer Neg Hx     Social History   Social History  . Marital status: Married    Spouse name: [name]  . Number of children: 2  . 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 Smoking status: Never Smoker  . Smokeless tobacco: Never Used  . Alcohol use No  . Drug use: No  . Sexual activity: Yes  Comment: lives with husband, no dietary restrictions just watching carbs, wears seat belt   Other Topics Concern  . Not on file   Social History Narrative  . No narrative on file    Outpatient Medications Prior to Visit  Medication Sig Dispense Refill  . ASPIR-LOW 81 MG EC tablet TAKE 1 TABLET BY MOUTH DAILY 30 tablet 3  . BAYER CONTOUR TEST test strip USE AS INSTRUCTED 100 each 1  . bisoprolol-hydrochlorothiazide (ZIAC) 5-6.25 MG tablet Take 1 tablet by mouth daily. 90 tablet 0  . canagliflozin (INVOKANA) 300 MG TABS tablet Take 1 tablet (300 mg total) by mouth daily. 90 tablet 1  . gabapentin (NEURONTIN) 300 MG capsule Take 1 capsule (300 mg total) by mouth 3 (three) times daily. Start with 1 tab nightly and increase a tab every 4 days. 90 capsule 3  . metFORMIN (GLUCOPHAGE) 1000 MG tablet TAKE 1 TABLET BY MOUTH TWICE A DAY AND TAKE 1/2 TABLET AT NOON 75 tablet 2  . omeprazole (PRILOSEC) 40 MG capsule TAKE 1  CAPSULE (40 MG) BY MOUTH 2 TIMES A DAY. 60 capsule 5  . valACYclovir (VALTREX) 1000 MG tablet Take 1 tablet (1,000 mg total) by mouth 3 (three) times daily. 21 tablet 0  . carbamazepine (TEGRETOL) 200 MG tablet Take 2 tablets by mouth 2 (two) times daily.    . cyclobenzaprine (FLEXERIL) 10 MG tablet Take 1 tablet (10 mg total) by mouth at bedtime as needed for muscle spasms. (Patient not taking: Reported on 07/09/2016) 30 tablet 1  . fluticasone (FLONASE) 50 MCG/ACT nasal spray Place 2 sprays into both nostrils daily. (Patient not taking: Reported on 07/09/2016) 16 g 1  . KRILL OIL PO Take 1 tablet by mouth daily.     Marland Kitchen lidocaine (XYLOCAINE) 2 % solution Use as directed 20 mLs in the mouth or throat as needed for mouth pain. (Patient not taking: Reported on 07/09/2016) 100 mL 0  . Probiotic Product (PROBIOTIC DAILY PO) Take 1 capsule by mouth daily.    . traMADol (ULTRAM) 50 MG tablet Take 1 tablet (50 mg total) by mouth every 8 (eight) hours as needed. (Patient not taking: Reported on 07/09/2016) 30 tablet 0  . CRESTOR 5 MG tablet TAKE 1/2 TABLET BY MOUTH EVERY DAY (Patient not taking: Reported on 07/09/2016) 15 tablet 1   No facility-administered medications prior to visit.     Allergies  Allergen Reactions  . Pravastatin Other (See Comments)    Myalgia, fatigue, memory loss  . Amoxicillin Itching    REACTION: rash  . Atorvastatin Other (See Comments)    Myalgia, fatigue, memory loss  . Codeine Nausea And Vomiting    Review of Systems  Constitutional: Negative for fever and malaise/fatigue.  HENT: Negative for congestion.   Eyes: Negative for blurred vision.  Respiratory: Negative for cough and shortness of breath.   Cardiovascular: Negative for chest pain, palpitations and leg swelling.  Gastrointestinal: Negative for vomiting.  Genitourinary: Positive for dysuria and frequency.  Musculoskeletal: Negative for back pain.  Skin: Negative for rash.  Neurological: Negative for loss of  consciousness and headaches.       Objective:    Physical Exam  Constitutional: She is oriented to person, place, and time. She appears well-developed and well-nourished. No distress.  HENT:  Head: Normocephalic and atraumatic.  Eyes: Conjunctivae are normal.  Neck: Normal range of motion. No thyromegaly present.  Cardiovascular: Normal rate and regular rhythm.   Pulmonary/Chest: Effort normal and breath sounds normal.  She has no wheezes.  Abdominal: Soft. Bowel sounds are normal. There is no tenderness.  Genitourinary: Vaginal discharge found.  Genitourinary Comments: No mucosal or vulvar lesions.  Musculoskeletal: She exhibits no edema or deformity.  Neurological: She is alert and oriented to person, place, and time.  Skin: Skin is warm and dry. She is not diaphoretic.  Psychiatric: She has a normal mood and affect.    BP 110/60 (BP Location: Left Arm, Patient Position: Sitting, Cuff Size: Large)   Pulse 68   Temp 97.8 F (36.6 C) (Oral)   Ht 5\' 3"  (1.6 m)   Wt 189 lb (85.7 kg)   SpO2 98% Comment: RA  BMI 33.48 kg/m  Wt Readings from Last 3 Encounters:  07/09/16 189 lb (85.7 kg)  03/11/16 186 lb 2 oz (84.4 kg)  03/05/16 188 lb 6.4 oz (85.5 kg)     Lab Results  Component Value Date   WBC 7.1 07/09/2016   HGB 14.3 07/09/2016   HCT 42.9 07/09/2016   PLT 260.0 07/09/2016   GLUCOSE 138 (H) 07/09/2016   CHOL 238 (H) 07/09/2016   TRIG 155.0 (H) 07/09/2016   HDL 46.10 07/09/2016   LDLCALC 161 (H) 07/09/2016   ALT 44 (H) 07/09/2016   AST 26 07/09/2016   NA 138 07/09/2016   K 4.1 07/09/2016   CL 100 07/09/2016   CREATININE 0.96 07/09/2016   BUN 17 07/09/2016   CO2 30 07/09/2016   TSH 1.29 07/09/2016   HGBA1C 7.4 (H) 03/11/2016   MICROALBUR <0.7 12/04/2015    Lab Results  Component Value Date   TSH 1.29 07/09/2016   Lab Results  Component Value Date   WBC 7.1 07/09/2016   HGB 14.3 07/09/2016   HCT 42.9 07/09/2016   MCV 82.0 07/09/2016   PLT 260.0  07/09/2016   Lab Results  Component Value Date   NA 138 07/09/2016   K 4.1 07/09/2016   CO2 30 07/09/2016   GLUCOSE 138 (H) 07/09/2016   BUN 17 07/09/2016   CREATININE 0.96 07/09/2016   BILITOT 0.4 07/09/2016   ALKPHOS 76 07/09/2016   AST 26 07/09/2016   ALT 44 (H) 07/09/2016   PROT 7.8 07/09/2016   ALBUMIN 4.4 07/09/2016   CALCIUM 10.2 07/09/2016   ANIONGAP 16 (H) 12/19/2013   GFR 75.29 07/09/2016   Lab Results  Component Value Date   CHOL 238 (H) 07/09/2016   Lab Results  Component Value Date   HDL 46.10 07/09/2016   Lab Results  Component Value Date   LDLCALC 161 (H) 07/09/2016   Lab Results  Component Value Date   TRIG 155.0 (H) 07/09/2016   Lab Results  Component Value Date   CHOLHDL 5 07/09/2016   Lab Results  Component Value Date   HGBA1C 7.4 (H) 03/11/2016       Assessment & Plan:   Problem List Items Addressed This Visit    Diabetes mellitus type 2 in obese ([place]) - Primary    hgba1c acceptable, minimize simple carbs. Increase exercise as tolerated.       Hyperlipidemia, mixed    Encouraged heart healthy diet, increase exercise, avoid trans fats, consider a krill oil cap daily      Relevant Orders   Lipid panel (Completed)   Depression with anxiety    She is doing well at this time      Essential hypertension    Well controlled, no changes to meds. Encouraged heart healthy diet such as the DASH diet and  exercise as tolerated.       Relevant Orders   CBC (Completed)   Comprehensive metabolic panel (Completed)   OSA (obstructive sleep apnea)    Has not been using dental appliance due to bad teeth      Relevant Orders   Ambulatory referral to Pulmonology   Abdominal pain    Has been present for roughly a month. She describes it as sometimes starts in throat other times in flanks. Starts at 4 am then moves down abdomen to suprapubic region, sometimes she urinates it resolves and sometimes she describes rectal spasm then pain dissipates.  Bowel movement still 1-3 x daily but larger, no blood or tarry stool . No anorexia or nausea or vomiting. Right about the time this started she was having upset stomach and possible GI bug. Encouraged to start probiotics. Avoid offending foods, start NOW probiotics. Do not eat large meals in late evening and consider raising head of bed.       Relevant Orders   POCT Urinalysis Dipstick (Completed)   Cervical cancer screening    Pap today, no concerns on exam.       Relevant Orders   Cytology - PAP   Preventative health care    Patient encouraged to maintain heart healthy diet, regular exercise, adequate sleep. Consider daily probiotics. Take medications as prescribed. Labs ordered today      Relevant Orders   CBC (Completed)   Comprehensive metabolic panel (Completed)   Lipid panel (Completed)   TSH (Completed)    Other Visit Diagnoses    Urinary frequency       Relevant Orders   POCT Urinalysis Dipstick (Completed)      I have discontinued Ms. Donnan's CRESTOR. I am also having her start on hyoscyamine. Additionally, I am having her maintain her BAYER CONTOUR TEST, Probiotic Product (PROBIOTIC DAILY PO), KRILL OIL PO, ASPIR-LOW, cyclobenzaprine, fluticasone, omeprazole, canagliflozin, traMADol, lidocaine, carbamazepine, gabapentin, valACYclovir, metFORMIN, and bisoprolol-hydrochlorothiazide.  Meds ordered this encounter  Medications  . hyoscyamine (LEVSIN, ANASPAZ) 0.125 MG tablet    Sig: Take 1 tablet (0.125 mg total) by mouth every 6 (six) hours as needed.    Dispense:  30 tablet    Refill:  0    CMA served as scribe during this visit. History, Physical and Plan performed by medical provider. Documentation and orders reviewed and attested to.  [name], MD

## 2016-07-09 NOTE — Assessment & Plan Note (Signed)
Patient encouraged to maintain heart healthy diet, regular exercise, adequate sleep. Consider daily probiotics. Take medications as prescribed. Labs ordered today 

## 2016-07-09 NOTE — Assessment & Plan Note (Signed)
Pap today, no concerns on exam.  

## 2016-07-09 NOTE — Assessment & Plan Note (Signed)
Well controlled, no changes to meds. Encouraged heart healthy diet such as the DASH diet and exercise as tolerated.  °

## 2016-07-09 NOTE — Assessment & Plan Note (Signed)
Encouraged heart healthy diet, increase exercise, avoid trans fats, consider a krill oil cap daily 

## 2016-07-09 NOTE — Assessment & Plan Note (Addendum)
Has not been using dental appliance due to bad teeth

## 2016-07-09 NOTE — Assessment & Plan Note (Signed)
hgba1c acceptable, minimize simple carbs. Increase exercise as tolerated.  

## 2016-07-09 NOTE — Patient Instructions (Signed)
Encouraged to incorporate the NOW Probiotic Preventive Care 40-64 Years, Female Preventive care refers to lifestyle choices and visits with your health care provider that can promote health and wellness. What does preventive care include?  A yearly physical exam. This is also called an annual well check.  Dental exams once or twice a year.  Routine eye exams. Ask your health care provider how often you should have your eyes checked.  Personal lifestyle choices, including:  Daily care of your teeth and gums.  Regular physical activity.  Eating a healthy diet.  Avoiding tobacco and drug use.  Limiting alcohol use.  Practicing safe sex.  Taking low-dose aspirin daily starting at age 53.  Taking vitamin and mineral supplements as recommended by your health care provider. What happens during an annual well check? The services and screenings done by your health care provider during your annual well check will depend on your age, overall health, lifestyle risk factors, and family history of disease. Counseling  Your health care provider may ask you questions about your:  Alcohol use.  Tobacco use.  Drug use.  Emotional well-being.  Home and relationship well-being.  Sexual activity.  Eating habits.  Work and work Statistician.  Method of birth control.  Menstrual cycle.  Pregnancy history. Screening  You may have the following tests or measurements:  Height, weight, and BMI.  Blood pressure.  Lipid and cholesterol levels. These may be checked every 5 years, or more frequently if you are over 72 years old.  Skin check.  Lung cancer screening. You may have this screening every year starting at age 64 if you have a 30-pack-year history of smoking and currently smoke or have quit within the past 15 years.  Fecal occult blood test (FOBT) of the stool. You may have this test every year starting at age 105.  Flexible sigmoidoscopy or colonoscopy. You may have a  sigmoidoscopy every 5 years or a colonoscopy every 10 years starting at age 40.  Hepatitis C blood test.  Hepatitis B blood test.  Sexually transmitted disease (STD) testing.  Diabetes screening. This is done by checking your blood sugar (glucose) after you have not eaten for a while (fasting). You may have this done every 1-3 years.  Mammogram. This may be done every 1-2 years. Talk to your health care provider about when you should start having regular mammograms. This may depend on whether you have a family history of breast cancer.  BRCA-related cancer screening. This may be done if you have a family history of breast, ovarian, tubal, or peritoneal cancers.  Pelvic exam and Pap test. This may be done every 3 years starting at age 1. Starting at age 69, this may be done every 5 years if you have a Pap test in combination with an HPV test.  Bone density scan. This is done to screen for osteoporosis. You may have this scan if you are at high risk for osteoporosis. Discuss your test results, treatment options, and if necessary, the need for more tests with your health care provider. Vaccines  Your health care provider may recommend certain vaccines, such as:  Influenza vaccine. This is recommended every year.  Tetanus, diphtheria, and acellular pertussis (Tdap, Td) vaccine. You may need a Td booster every 10 years.  Varicella vaccine. You may need this if you have not been vaccinated.  Zoster vaccine. You may need this after age 73.  Measles, mumps, and rubella (MMR) vaccine. You may need at least one dose  of MMR if you were born in 1957 or later. You may also need a second dose.  Pneumococcal 13-valent conjugate (PCV13) vaccine. You may need this if you have certain conditions and were not previously vaccinated.  Pneumococcal polysaccharide (PPSV23) vaccine. You may need one or two doses if you smoke cigarettes or if you have certain conditions.  Meningococcal vaccine. You may  need this if you have certain conditions.  Hepatitis A vaccine. You may need this if you have certain conditions or if you travel or work in places where you may be exposed to hepatitis A.  Hepatitis B vaccine. You may need this if you have certain conditions or if you travel or work in places where you may be exposed to hepatitis B.  Haemophilus influenzae type b (Hib) vaccine. You may need this if you have certain conditions. Talk to your health care provider about which screenings and vaccines you need and how often you need them. This information is not intended to replace advice given to you by your health care provider. Make sure you discuss any questions you have with your health care provider. Document Released: 06/27/2015 Document Revised: 02/18/2016 Document Reviewed: 04/01/2015 Elsevier Interactive Patient Education  2017 [employer].

## 2016-07-09 NOTE — Progress Notes (Signed)
Patient ID: [name], female   DOB: 15-Jul-1950, 66 y.o.   MRN: [id number]

## 2016-07-09 NOTE — Assessment & Plan Note (Signed)
Has been present for roughly a month. She describes it as sometimes starts in throat other times in flanks. Starts at 4 am then moves down abdomen to suprapubic region, sometimes she urinates it resolves and sometimes she describes rectal spasm then pain dissipates. Bowel movement still 1-3 x daily but larger, no blood or tarry stool . No anorexia or nausea or vomiting. Right about the time this started she was having upset stomach and possible GI bug. Encouraged to start probiotics. Avoid offending foods, start NOW probiotics. Do not eat large meals in late evening and consider raising head of bed.

## 2016-07-09 NOTE — Progress Notes (Signed)
Pre visit review using our clinic review tool, if applicable. No additional management support is needed unless otherwise documented below in the visit note. 

## 2016-07-11 NOTE — Assessment & Plan Note (Signed)
She is doing well at this time

## 2016-07-12 LAB — CYTOLOGY - PAP: DIAGNOSIS: NEGATIVE

## 2016-07-14 MED FILL — OMEPRAZOLE DR 40 MG CAPSULE: 40 | 30 days supply | Qty: 60 | Fill #4

## 2016-07-14 MED FILL — BISOPROLOL-HCTZ 5-6.25 MG T: 5-6.25 | 90 days supply | Qty: 90 | Fill #0

## 2016-07-16 ENCOUNTER — Telehealth: Payer: Self-pay | Admitting: Family Medicine

## 2016-07-16 MED ORDER — GLIMEPIRIDE 2 MG PO TABS: 2.0000 mg | ORAL_TABLET | Freq: Two times a day (BID) | ORAL | 3 refills | Status: DC

## 2016-07-16 MED FILL — GLIMEPIRIDE 2 MG TABLET: 2 | 30 days supply | Qty: 60 | Fill #0

## 2016-07-16 NOTE — Telephone Encounter (Signed)
Updated medication list and sent in gl[facility]peride to the [facility]. Called the patient and did inform her of PCP instructions regarding the medications/all instructions on new medication.  She agreed to try it and will call if have any problems with it.

## 2016-07-16 NOTE — Telephone Encounter (Signed)
Everything in that family has gone up with the insurance companies this year. Recommend we switch to Glimiperide 2 mg tabs, 1 tab po bid, disp #60 and check sugars twice daily. Once before breakfast and an hour after biggest meal to make sure she does not have any significantly low numbers. No skipping meals, 3 rf

## 2016-07-16 NOTE — Telephone Encounter (Signed)
Pt called in. She said that she was prescribed INVOKANA. Pt says that the medication is to expensive (over 200.00) she cant afford it. Pt would like to know if provider could possibly prescribe something else that works the same that is less expensive.    CB: [phone]   Pharmacy: [facility], [place] [address]

## 2016-07-27 ENCOUNTER — Ambulatory Visit (HOSPITAL_BASED_OUTPATIENT_CLINIC_OR_DEPARTMENT_OTHER)
Admission: RE | Admit: 2016-07-27 | Discharge: 2016-07-27 | Disposition: A | Discharge status: Home / Self Care | Payer: BLUE CROSS/BLUE SHIELD | Source: Ambulatory Visit | Attending: Family Medicine | Admitting: Family Medicine

## 2016-07-27 ENCOUNTER — Ambulatory Visit (INDEPENDENT_AMBULATORY_CARE_PROVIDER_SITE_OTHER): Payer: BLUE CROSS/BLUE SHIELD | Admitting: Family Medicine

## 2016-07-27 VITALS — BP 100/66 | HR 71 | Temp 98.0°F | Wt 193.4 lb

## 2016-07-27 DIAGNOSIS — R109 Unspecified abdominal pain: Secondary | ICD-10-CM | POA: Diagnosis not present

## 2016-07-27 DIAGNOSIS — M545 Low back pain: Secondary | ICD-10-CM

## 2016-07-27 DIAGNOSIS — E669 Obesity, unspecified: Secondary | ICD-10-CM

## 2016-07-27 DIAGNOSIS — E1169 Type 2 diabetes mellitus with other specified complication: Secondary | ICD-10-CM

## 2016-07-27 DIAGNOSIS — I10 Essential (primary) hypertension: Secondary | ICD-10-CM

## 2016-07-27 DIAGNOSIS — R1013 Epigastric pain: Secondary | ICD-10-CM | POA: Diagnosis not present

## 2016-07-27 LAB — POC URINALSYSI DIPSTICK (AUTOMATED)
BILIRUBIN UA: NEGATIVE
Glucose, UA: NEGATIVE
KETONES UA: NEGATIVE
Leukocytes, UA: NEGATIVE
Nitrite, UA: NEGATIVE
PH UA: 5.5
Protein, UA: NEGATIVE
RBC UA: NEGATIVE
Urobilinogen, UA: 4

## 2016-07-27 NOTE — Progress Notes (Signed)
Patient ID: [name], female   DOB: 1950-08-30, 66 y.o.   MRN: [id number]   Subjective:    Patient ID: [name], female    DOB: [birth year], 66 y.o.   MRN: [id number]  Chief Complaint  Patient presents with  . Abdominal Pain  I acted as a Education administrator for Dr. Charlett Blake. Princess, RMA   Abdominal Pain  This is a new problem. The current episode started more than 1 month ago. The onset quality is gradual. The problem occurs intermittently. The problem has been gradually worsening. The quality of the pain is cramping and aching. The abdominal pain radiates to the back and pelvis. Associated symptoms include frequency. Pertinent negatives include no constipation, dysuria, fever, headaches, hematuria, melena, nausea or vomiting.    Patient is in today complaining of abdominal pain, she states the pain radiates to her lower back and also pelvic pain. Patient states the pain has been on going for 6 weeks. It comes and goes and is actually better today than it has been. Pain is most notable in right lower quadrant when it occurs but it can occur in epigastrium. No bloody or tarry stool. She has some trouble with increased urinary frequency and bowel frequency but no incontinence. Denies CP/palp/SOB/HA/congestion/fevers. Taking meds as prescribed  Past Medical History:  Diagnosis Date  . Acute bronchitis 10/31/2015  . Allergy    seasonal  . Arthritis    knee-left  . Cataract    right eye  . Depression    job/stress related  . Diabetes mellitus 52   type 2  . Diaphragmatic hernia without mention of obstruction or gangrene   . Endometriosis   . Esophageal reflux   . Esophageal stricture   . GERD (gastroesophageal reflux disease)   . Hyperlipidemia   . Hyperplastic colon polyp   . Hypertension 52  . Nonalcoholic fatty liver disease 04/28/2010   Qualifier: Diagnosis of  By: Arnoldo Morale MD, Balinda Quails   . Nonspecific abnormal results of liver function study   . Pharyngitis 02/18/2012  . Plantar  fascial fibromatosis   . Preventative health care 01/22/2013  . Seasonal allergies 10/31/2015  . Sleep apnea    mouth piece per pt.  . Tubular adenoma of colon   . Unspecified sleep apnea    no c-pap    Past Surgical History:  Procedure Laterality Date  . ABDOMINAL HYSTERECTOMY  1982   partial  . APPENDECTOMY    . 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 BREAST CYST INCISION AND DRAINAGE     right breast  . CATARACT EXTRACTION     right eye  . HERNIA REPAIR     per pt, she is not aware of this surgery  . seba    . TUBAL LIGATION      Family History  Problem Relation Age of Onset  . Breast cancer Mother   . Hypertension Mother   . Diabetes Mother     type 2  . Other Mother     esophagus surgery  . Heart disease Father   . Hyperlipidemia Father   . Hypertension Father   . Heart attack Father     MI at age 21  . Diabetes Brother     type 2  . Heart disease Maternal Grandmother   . Heart attack Maternal Grandfather   . Hypertension Maternal Grandfather   . Stroke Paternal Grandmother   . Hypertension Sister   . Diabetes Sister   . Hypertension  Brother   . Alcohol abuse Maternal Aunt   . Colon cancer Neg Hx   . Stomach cancer Neg Hx   . Esophageal cancer Neg Hx     Social History   Social History  . Marital status: Married    Spouse name: [name]  . Number of children: 2  . 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 Smoking status: Never Smoker  . Smokeless tobacco: Never Used  . Alcohol use No  . Drug use: No  . Sexual activity: Yes     Comment: lives with husband, no dietary restrictions just watching carbs, wears seat belt   Other Topics Concern  . Not on file   Social History Narrative  . No narrative on file    Outpatient Medications Prior to Visit  Medication Sig Dispense Refill  . ASPIR-LOW 81 MG EC tablet TAKE 1 TABLET BY MOUTH DAILY 30 tablet 3  . BAYER CONTOUR TEST test  strip USE AS INSTRUCTED 100 each 1  . bisoprolol-hydrochlorothiazide (ZIAC) 5-6.25 MG tablet Take 1 tablet by mouth daily. 90 tablet 0  . gabapentin (NEURONTIN) 300 MG capsule Take 1 capsule (300 mg total) by mouth 3 (three) times daily. Start with 1 tab nightly and increase a tab every 4 days. 90 capsule 3  . glimepiride (AMARYL) 2 MG tablet Take 1 tablet (2 mg total) by mouth 2 (two) times daily. 60 tablet 3  . hyoscyamine (LEVSIN, ANASPAZ) 0.125 MG tablet Take 1 tablet (0.125 mg total) by mouth every 6 (six) hours as needed. 30 tablet 0  . KRILL OIL PO Take 1 tablet by mouth daily.     . metFORMIN (GLUCOPHAGE) 1000 MG tablet TAKE 1 TABLET BY MOUTH TWICE A DAY AND TAKE 1/2 TABLET AT NOON 75 tablet 2  . omeprazole (PRILOSEC) 40 MG capsule TAKE 1 CAPSULE (40 MG) BY MOUTH 2 TIMES A DAY. 60 capsule 5  . Probiotic Product (PROBIOTIC DAILY PO) Take 1 capsule by mouth daily.    . valACYclovir (VALTREX) 1000 MG tablet Take 1 tablet (1,000 mg total) by mouth 3 (three) times daily. 21 tablet 0  . carbamazepine (TEGRETOL) 200 MG tablet Take 2 tablets by mouth 2 (two) times daily.    . cyclobenzaprine (FLEXERIL) 10 MG tablet Take 1 tablet (10 mg total) by mouth at bedtime as needed for muscle spasms. (Patient not taking: Reported on 07/09/2016) 30 tablet 1  . fluticasone (FLONASE) 50 MCG/ACT nasal spray Place 2 sprays into both nostrils daily. (Patient not taking: Reported on 07/09/2016) 16 g 1  . lidocaine (XYLOCAINE) 2 % solution Use as directed 20 mLs in the mouth or throat as needed for mouth pain. (Patient not taking: Reported on 07/09/2016) 100 mL 0  . traMADol (ULTRAM) 50 MG tablet Take 1 tablet (50 mg total) by mouth every 8 (eight) hours as needed. (Patient not taking: Reported on 07/09/2016) 30 tablet 0   No facility-administered medications prior to visit.     Allergies  Allergen Reactions  . Pravastatin Other (See Comments)    Myalgia, fatigue, memory loss  . Amoxicillin Itching    REACTION:  rash  . Atorvastatin Other (See Comments)    Myalgia, fatigue, memory loss  . Codeine Nausea And Vomiting    Review of Systems  Constitutional: Positive for malaise/fatigue. Negative for fever.  HENT: Negative for congestion.   Eyes: Negative for blurred vision.  Respiratory: Negative  for cough and shortness of breath.   Cardiovascular: Negative for chest pain, palpitations and leg swelling.  Gastrointestinal: Positive for abdominal pain. Negative for blood in stool, constipation, melena, nausea and vomiting.  Genitourinary: Positive for frequency. Negative for dysuria, hematuria and urgency.  Musculoskeletal: Positive for back pain.  Skin: Negative for rash.  Neurological: Positive for weakness. Negative for loss of consciousness and headaches.       Objective:    Physical Exam  Constitutional: She is oriented to person, place, and time. She appears well-developed and well-nourished. No distress.  HENT:  Head: Normocephalic and atraumatic.  Eyes: Conjunctivae are normal.  Neck: Normal range of motion. No thyromegaly present.  Cardiovascular: Normal rate and regular rhythm.   Pulmonary/Chest: Effort normal and breath sounds normal. She has no wheezes.  Abdominal: Soft. Bowel sounds are normal. She exhibits no distension and no mass. There is no tenderness. There is no rebound and no guarding.  Musculoskeletal: Normal range of motion. She exhibits no edema or deformity.  Lymphadenopathy:    She has no cervical adenopathy.  Neurological: She is alert and oriented to person, place, and time.  Skin: Skin is warm and dry. She is not diaphoretic.  Psychiatric: She has a normal mood and affect.    BP 100/66 (BP Location: Left Arm, Patient Position: Sitting, Cuff Size: Normal)   Pulse 71   Temp 98 F (36.7 C) (Oral)   Wt 193 lb 6.4 oz (87.7 kg)   SpO2 96%   BMI 34.26 kg/m  Wt Readings from Last 3 Encounters:  07/27/16 193 lb 6.4 oz (87.7 kg)  07/09/16 189 lb (85.7 kg)    03/11/16 186 lb 2 oz (84.4 kg)     Lab Results  Component Value Date   WBC 6.5 07/27/2016   HGB 13.1 07/27/2016   HCT 40.2 07/27/2016   PLT 251.0 07/27/2016   GLUCOSE 106 (H) 07/27/2016   CHOL 238 (H) 07/09/2016   TRIG 155.0 (H) 07/09/2016   HDL 46.10 07/09/2016   LDLCALC 161 (H) 07/09/2016   ALT 37 (H) 07/27/2016   AST 20 07/27/2016   NA 140 07/27/2016   K 4.0 07/27/2016   CL 104 07/27/2016   CREATININE 0.77 07/27/2016   BUN 15 07/27/2016   CO2 29 07/27/2016   TSH 1.29 07/09/2016   HGBA1C 7.4 (H) 03/11/2016   MICROALBUR <0.7 12/04/2015    Lab Results  Component Value Date   TSH 1.29 07/09/2016   Lab Results  Component Value Date   WBC 6.5 07/27/2016   HGB 13.1 07/27/2016   HCT 40.2 07/27/2016   MCV 83.5 07/27/2016   PLT 251.0 07/27/2016   Lab Results  Component Value Date   NA 140 07/27/2016   K 4.0 07/27/2016   CO2 29 07/27/2016   GLUCOSE 106 (H) 07/27/2016   BUN 15 07/27/2016   CREATININE 0.77 07/27/2016   BILITOT 0.3 07/27/2016   ALKPHOS 70 07/27/2016   AST 20 07/27/2016   ALT 37 (H) 07/27/2016   PROT 6.8 07/27/2016   ALBUMIN 4.0 07/27/2016   CALCIUM 9.3 07/27/2016   ANIONGAP 16 (H) 12/19/2013   GFR 97.09 07/27/2016   Lab Results  Component Value Date   CHOL 238 (H) 07/09/2016   Lab Results  Component Value Date   HDL 46.10 07/09/2016   Lab Results  Component Value Date   LDLCALC 161 (H) 07/09/2016   Lab Results  Component Value Date   TRIG 155.0 (H) 07/09/2016   Lab Results  Component Value Date   CHOLHDL 5 07/09/2016   Lab Results  Component Value Date   HGBA1C 7.4 (H) 03/11/2016       Assessment & Plan:   Problem List Items Addressed This Visit    Diabetes mellitus type 2 in obese ([place])    minimize simple carbs. Increase exercise as tolerated. Continue current meds      Essential hypertension    Well controlled, no changes to meds. Encouraged heart healthy diet such as the DASH diet and exercise as tolerated.        Abdominal pain    Has been noting intermittent abdominal pain mostly in lower abdomen to right but she also notes some epigastric discomfort as well. It comes and goes and it is better today. Notes some increase in low back pain as well. Lab work is unremarkable. She will add fiber, fluids probiotics and use topical treatments for back but she is aware that if pain does not resolve she needs to proceed with diagnostic imaging due to length of symptoms      Relevant Orders   H. pylori antibody, IgG (Completed)   POCT Urinalysis Dipstick (Automated) (Completed)   CT Abdomen Pelvis W Contrast   CBC w/Diff (Completed)   H. pylori antibody, IgG (Completed)   Comprehensive metabolic panel (Completed)    Other Visit Diagnoses    Low back pain, unspecified back pain laterality, unspecified chronicity, with sciatica presence unspecified    -  Primary   Relevant Orders   POCT Urinalysis Dipstick (Automated) (Completed)   CT Abdomen Pelvis W Contrast   CBC w/Diff (Completed)      I am having Ms. Hurrell maintain her BAYER CONTOUR TEST, Probiotic Product (PROBIOTIC DAILY PO), KRILL OIL PO, ASPIR-LOW, cyclobenzaprine, fluticasone, omeprazole, traMADol, lidocaine, carbamazepine, gabapentin, valACYclovir, metFORMIN, bisoprolol-hydrochlorothiazide, hyoscyamine, and glimepiride.  No orders of the defined types were placed in this encounter.   CMA served as Education administrator during this visit. History, Physical and Plan performed by medical provider. Documentation and orders reviewed and attested to.  [name], MD

## 2016-07-27 NOTE — Progress Notes (Signed)
Pre visit review using our clinic review tool, if applicable. No additional management support is needed unless otherwise documented below in the visit note. 

## 2016-07-27 NOTE — Patient Instructions (Signed)

## 2016-07-28 ENCOUNTER — Telehealth: Payer: Self-pay | Admitting: Family Medicine

## 2016-07-28 ENCOUNTER — Ambulatory Visit (HOSPITAL_BASED_OUTPATIENT_CLINIC_OR_DEPARTMENT_OTHER): Admission: RE | Admit: 2016-07-28 | Payer: BLUE CROSS/BLUE SHIELD | Source: Ambulatory Visit

## 2016-07-28 LAB — COMPREHENSIVE METABOLIC PANEL
ALT: 37 U/L — ABNORMAL HIGH (ref 0–35)
AST: 20 U/L (ref 0–37)
Albumin: 4 g/dL (ref 3.5–5.2)
Alkaline Phosphatase: 70 U/L (ref 39–117)
BUN: 15 mg/dL (ref 6–23)
CHLORIDE: 104 meq/L (ref 96–112)
CO2: 29 mEq/L (ref 19–32)
Calcium: 9.3 mg/dL (ref 8.4–10.5)
Creatinine, Ser: 0.77 mg/dL (ref 0.40–1.20)
GFR: 97.09 mL/min (ref >60.00)
GLUCOSE: 106 mg/dL — AB (ref 70–99)
POTASSIUM: 4 meq/L (ref 3.5–5.1)
Sodium: 140 mEq/L (ref 135–145)
TOTAL PROTEIN: 6.8 g/dL (ref 6.0–8.3)
Total Bilirubin: 0.3 mg/dL (ref 0.2–1.2)

## 2016-07-28 LAB — CBC WITH DIFFERENTIAL/PLATELET
BASOS PCT: 0.4 % (ref 0.0–3.0)
Basophils Absolute: 0 10*3/uL (ref 0.0–0.1)
EOS ABS: 0.1 10*3/uL (ref 0.0–0.7)
EOS PCT: 1.2 % (ref 0.0–5.0)
HCT: 40.2 % (ref 36.0–46.0)
HEMOGLOBIN: 13.1 g/dL (ref 12.0–15.0)
LYMPHS ABS: 2.5 10*3/uL (ref 0.7–4.0)
Lymphocytes Relative: 38.9 % (ref 12.0–46.0)
MCHC: 32.6 g/dL (ref 30.0–36.0)
MCV: 83.5 fl (ref 78.0–100.0)
MONO ABS: 0.6 10*3/uL (ref 0.1–1.0)
Monocytes Relative: 8.8 % (ref 3.0–12.0)
NEUTROS ABS: 3.3 10*3/uL (ref 1.4–7.7)
NEUTROS PCT: 50.7 % (ref 43.0–77.0)
PLATELETS: 251 10*3/uL (ref 150.0–400.0)
RBC: 4.82 Mil/uL (ref 3.87–5.11)
RDW: 13.6 % (ref 11.5–15.5)
WBC: 6.5 10*3/uL (ref 4.0–10.5)

## 2016-07-28 LAB — H. PYLORI ANTIBODY, IGG: H PYLORI IGG: NEGATIVE

## 2016-07-28 NOTE — Telephone Encounter (Signed)
Caller name: Relationship to patient: Self Can be reached: [phone] or [phone] Pharmacy:  Reason for call: Patient needs a referral to [facility] for CT w/contrast. Insurance will only pay for it at this location

## 2016-07-28 NOTE — Telephone Encounter (Signed)
Location changed for insurance [facility] # [id number], order sent to [facility]

## 2016-07-29 ENCOUNTER — Encounter: Payer: Self-pay | Admitting: Family Medicine

## 2016-07-29 ENCOUNTER — Ambulatory Visit: Payer: BLUE CROSS/BLUE SHIELD | Admitting: Family Medicine

## 2016-08-02 NOTE — Assessment & Plan Note (Signed)
Well controlled, no changes to meds. Encouraged heart healthy diet such as the DASH diet and exercise as tolerated.  °

## 2016-08-02 NOTE — Assessment & Plan Note (Signed)
minimize simple carbs. Increase exercise as tolerated. Continue current meds  

## 2016-08-02 NOTE — Assessment & Plan Note (Signed)
Has been noting intermittent abdominal pain mostly in lower abdomen to right but she also notes some epigastric discomfort as well. It comes and goes and it is better today. Notes some increase in low back pain as well. Lab work is unremarkable. She will add fiber, fluids probiotics and use topical treatments for back but she is aware that if pain does not resolve she needs to proceed with diagnostic imaging due to length of symptoms

## 2016-08-10 ENCOUNTER — Other Ambulatory Visit: Payer: Self-pay | Admitting: Family Medicine

## 2016-08-10 ENCOUNTER — Encounter: Payer: Self-pay | Admitting: Family Medicine

## 2016-08-10 DIAGNOSIS — R194 Change in bowel habit: Secondary | ICD-10-CM

## 2016-08-10 DIAGNOSIS — R1031 Right lower quadrant pain: Secondary | ICD-10-CM

## 2016-08-17 MED FILL — GLIMEPIRIDE 2 MG TABLET: 2 | 30 days supply | Qty: 60 | Fill #1

## 2016-08-17 MED FILL — OMEPRAZOLE DR 40 MG CAPSULE: 40 | 30 days supply | Qty: 60 | Fill #5

## 2016-08-20 ENCOUNTER — Ambulatory Visit
Admission: RE | Admit: 2016-08-20 | Discharge: 2016-08-20 | Disposition: A | Discharge status: Home / Self Care | Payer: BLUE CROSS/BLUE SHIELD | Source: Ambulatory Visit | Attending: Family Medicine | Admitting: Family Medicine

## 2016-08-20 DIAGNOSIS — R1031 Right lower quadrant pain: Secondary | ICD-10-CM

## 2016-08-20 DIAGNOSIS — R194 Change in bowel habit: Secondary | ICD-10-CM

## 2016-08-20 MED ORDER — IOPAMIDOL (ISOVUE-300) INJECTION 61%
100.0000 mL | Freq: Once | INTRAVENOUS | Status: AC | PRN
  Administered 2016-08-20: 100 mL via INTRAVENOUS

## 2016-08-25 ENCOUNTER — Institutional Professional Consult (permissible substitution): Payer: BLUE CROSS/BLUE SHIELD | Admitting: Pulmonary Disease

## 2016-08-26 MED FILL — metFORMIN HCL 1000 MG TABS: 1000 | 30 days supply | Qty: 75 | Fill #1

## 2016-09-06 ENCOUNTER — Encounter: Payer: Self-pay | Admitting: Family Medicine

## 2016-09-07 ENCOUNTER — Other Ambulatory Visit: Payer: Self-pay | Admitting: Family Medicine

## 2016-09-07 MED ORDER — GLIMEPIRIDE 2 MG PO TABS: ORAL_TABLET | ORAL | 2 refills | Status: DC

## 2016-09-07 MED FILL — GLIMEPIRIDE 2 MG TABLET: 2 | 30 days supply | Qty: 120 | Fill #0

## 2016-09-17 ENCOUNTER — Other Ambulatory Visit: Payer: Self-pay | Admitting: Gastroenterology

## 2016-09-17 MED FILL — OMEPRAZOLE DR 40 MG CAPSULE: 40 | 30 days supply | Qty: 60 | Fill #0

## 2016-10-04 ENCOUNTER — Ambulatory Visit (INDEPENDENT_AMBULATORY_CARE_PROVIDER_SITE_OTHER): Payer: BLUE CROSS/BLUE SHIELD | Admitting: Medical

## 2016-10-04 VITALS — Wt 196.0 lb

## 2016-10-04 DIAGNOSIS — M79601 Pain in right arm: Secondary | ICD-10-CM | POA: Diagnosis not present

## 2016-10-04 DIAGNOSIS — R109 Unspecified abdominal pain: Secondary | ICD-10-CM | POA: Diagnosis not present

## 2016-10-04 DIAGNOSIS — M542 Cervicalgia: Secondary | ICD-10-CM | POA: Diagnosis not present

## 2016-10-04 DIAGNOSIS — M792 Neuralgia and neuritis, unspecified: Secondary | ICD-10-CM

## 2016-10-04 LAB — CBC WITH DIFFERENTIAL/PLATELET
BASOS PCT: 0.6 % (ref 0.0–3.0)
Basophils Absolute: 0 10*3/uL (ref 0.0–0.1)
EOS ABS: 0.1 10*3/uL (ref 0.0–0.7)
Eosinophils Relative: 1.2 % (ref 0.0–5.0)
HCT: 40.1 % (ref 36.0–46.0)
HEMOGLOBIN: 13.4 g/dL (ref 12.0–15.0)
Lymphocytes Relative: 37.1 % (ref 12.0–46.0)
Lymphs Abs: 2.2 10*3/uL (ref 0.7–4.0)
MCHC: 33.3 g/dL (ref 30.0–36.0)
MCV: 82 fl (ref 78.0–100.0)
MONO ABS: 0.5 10*3/uL (ref 0.1–1.0)
Monocytes Relative: 7.6 % (ref 3.0–12.0)
Neutro Abs: 3.2 10*3/uL (ref 1.4–7.7)
Neutrophils Relative %: 53.5 % (ref 43.0–77.0)
Platelets: 253 10*3/uL (ref 150.0–400.0)
RBC: 4.89 Mil/uL (ref 3.87–5.11)
RDW: 14 % (ref 11.5–15.5)
WBC: 5.9 10*3/uL (ref 4.0–10.5)

## 2016-10-04 LAB — COMPREHENSIVE METABOLIC PANEL
ALBUMIN: 4.2 g/dL (ref 3.5–5.2)
ALT: 61 U/L — ABNORMAL HIGH (ref 0–35)
AST: 40 U/L — AB (ref 0–37)
Alkaline Phosphatase: 76 U/L (ref 39–117)
BUN: 20 mg/dL (ref 6–23)
CHLORIDE: 100 meq/L (ref 96–112)
CO2: 27 mEq/L (ref 19–32)
CREATININE: 1.08 mg/dL (ref 0.40–1.20)
Calcium: 9.6 mg/dL (ref 8.4–10.5)
GFR: 65.67 mL/min (ref >60.00)
Glucose, Bld: 232 mg/dL — ABNORMAL HIGH (ref 70–99)
Potassium: 4.1 mEq/L (ref 3.5–5.1)
SODIUM: 137 meq/L (ref 135–145)
Total Bilirubin: 0.4 mg/dL (ref 0.2–1.2)
Total Protein: 7.2 g/dL (ref 6.0–8.3)

## 2016-10-04 LAB — TROPONIN I: TNIDX: 0 ug/l (ref 0.00–0.06)

## 2016-10-04 LAB — AMYLASE: AMYLASE: 41 U/L (ref 27–131)

## 2016-10-04 LAB — LIPASE: Lipase: 40 U/L (ref 11.0–59.0)

## 2016-10-04 MED ORDER — RANITIDINE HCL 150 MG PO CAPS: 150.0000 mg | ORAL_CAPSULE | Freq: Two times a day (BID) | ORAL | 0 refills | Status: DC

## 2016-10-04 MED FILL — metFORMIN HCL 1000 MG TABS: 1000 | 30 days supply | Qty: 75 | Fill #2

## 2016-10-04 NOTE — Patient Instructions (Addendum)
For abdomen pain will order cbc, cmp, amylase, lipase, and h pylori.  Could add ranitidine otc.  Abd [place] order placed. (trying to have it scheduled by tomorrow)  For your daily rt upper arm pain with occasional neck pain, recommend xray of cspine and upper arm.  You can decide to get later. Let me know if you change your mind.  Will refer to GI since some features of pain may be gallbladder related.(example periscapular pain)  Follow up in 10 days or as needed

## 2016-10-04 NOTE — Progress Notes (Signed)
Subjective:    Patient ID: [name], female    DOB: 1950/12/19, 66 y.o.   MRN: [id number]  HPI  Pt in for follow up.  Pt in for some abdomen. Pain comes and goes.   Pain is in the rt upper quadrant area. Pt states she went to urgent care on 09-28-2016. Pt states at the [facility] they did not work up. No blood test done. No medication done. UC called [facility] [place] [facility].  Pt states pain has been coming and going in the past. Pt has had CT done in the past ordered by Dr. Charlett Blake. Pt did not do [place] since she had mistakingly thought might get some radiation from the test.  Pt states some pain after eating at times. Feels little gasy.   Pt states on and off pain in rt upper quadrant since around September of past year.  She states daily pain in her ruq region.  In past transient nausea. No vomiting. Pt does admit if eats greasy/high fat meal will have ruq pain.   Review of Systems  Respiratory: Negative for cough, chest tightness, shortness of breath and wheezing.   Cardiovascular: Negative for chest pain and palpitations.  Gastrointestinal: Negative for abdominal distention, blood in stool, constipation, diarrhea and rectal pain.       Last bowel movement today.   Musculoskeletal: Positive for neck pain.       Some pain on rt side of her neck. Some pain that shoots to her arm.  Pt states constant dull pain in rt arm for 2 weeks.  Psychiatric/Behavioral: Negative for behavioral problems, confusion and hallucinations.   Past Medical History:  Diagnosis Date  . Acute bronchitis 10/31/2015  . Allergy    seasonal  . Arthritis    knee-left  . Cataract    right eye  . Depression    job/stress related  . Diabetes mellitus 52   type 2  . Diaphragmatic hernia without mention of obstruction or gangrene   . Endometriosis   . Esophageal reflux   . Esophageal stricture   . GERD (gastroesophageal reflux disease)   . Hyperlipidemia   . Hyperplastic colon polyp   . Hypertension 52    . Nonalcoholic fatty liver disease 04/28/2010   Qualifier: Diagnosis of  By: Arnoldo Morale MD, Balinda Quails   . Nonspecific abnormal results of liver function study   . Pharyngitis 02/18/2012  . Plantar fascial fibromatosis   . Preventative health care 01/22/2013  . Seasonal allergies 10/31/2015  . Sleep apnea    mouth piece per pt.  . Tubular adenoma of colon   . Unspecified sleep apnea    no c-pap     Social History   Social History  . Marital status: Married    Spouse name: [name]  . Number of children: 2  . 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 Smoking status: Never Smoker  . Smokeless tobacco: Never Used  . Alcohol use No  . Drug use: No  . Sexual activity: Yes     Comment: lives with husband, no dietary restrictions just watching carbs, wears seat belt   Other Topics Concern  . Not on file   Social History Narrative  . No narrative on file    Past Surgical History:  Procedure Laterality Date  . ABDOMINAL HYSTERECTOMY  1982   partial  . APPENDECTOMY    . 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 BREAST CYST INCISION AND DRAINAGE     right breast  . CATARACT EXTRACTION     right eye  . HERNIA REPAIR     per pt, she is not aware of this surgery  . seba    . TUBAL LIGATION      Family History  Problem Relation Age of Onset  . Breast cancer Mother   . Hypertension Mother   . Diabetes Mother     type 2  . Other Mother     esophagus surgery  . Heart disease Father   . Hyperlipidemia Father   . Hypertension Father   . Heart attack Father     MI at age 57  . Diabetes Brother     type 2  . Heart disease Maternal Grandmother   . Heart attack Maternal Grandfather   . Hypertension Maternal Grandfather   . Stroke Paternal Grandmother   . Hypertension Sister   . Diabetes Sister   . Hypertension Brother   . Alcohol abuse Maternal Aunt   . Colon cancer Neg Hx   . Stomach cancer Neg Hx   .  Esophageal cancer Neg Hx     Allergies  Allergen Reactions  . Pravastatin Other (See Comments)    Myalgia, fatigue, memory loss  . Amoxicillin Itching    REACTION: rash  . Atorvastatin Other (See Comments)    Myalgia, fatigue, memory loss  . Codeine Nausea And Vomiting    Current Outpatient Prescriptions on File Prior to Visit  Medication Sig Dispense Refill  . ASPIR-LOW 81 MG EC tablet TAKE 1 TABLET BY MOUTH DAILY 30 tablet 3  . BAYER CONTOUR TEST test strip USE AS INSTRUCTED 100 each 1  . bisoprolol-hydrochlorothiazide (ZIAC) 5-6.25 MG tablet Take 1 tablet by mouth daily. 90 tablet 0  . cyclobenzaprine (FLEXERIL) 10 MG tablet Take 1 tablet (10 mg total) by mouth at bedtime as needed for muscle spasms. 30 tablet 1  . fluticasone (FLONASE) 50 MCG/ACT nasal spray Place 2 sprays into both nostrils daily. 16 g 1  . glimepiride (AMARYL) 2 MG tablet Take 2 tablets twice daily 120 tablet 2  . hyoscyamine (LEVSIN, ANASPAZ) 0.125 MG tablet Take 1 tablet (0.125 mg total) by mouth every 6 (six) hours as needed. 30 tablet 0  . KRILL OIL PO Take 1 tablet by mouth daily.     . metFORMIN (GLUCOPHAGE) 1000 MG tablet TAKE 1 TABLET BY MOUTH TWICE A DAY AND TAKE 1/2 TABLET AT NOON 75 tablet 2  . omeprazole (PRILOSEC) 40 MG capsule TAKE 1 CAPSULE (40 MG) BY MOUTH 2 TIMES A DAY. 60 capsule 2  . Probiotic Product (PROBIOTIC DAILY PO) Take 1 capsule by mouth daily.    . traMADol (ULTRAM) 50 MG tablet Take 1 tablet (50 mg total) by mouth every 8 (eight) hours as needed. 30 tablet 0  . valACYclovir (VALTREX) 1000 MG tablet Take 1 tablet (1,000 mg total) by mouth 3 (three) times daily. 21 tablet 0   No current facility-administered medications on file prior to visit.     Wt 196 lb (88.9 kg)   BMI 34.72 kg/m       Objective:   Physical Exam  General Appearance- Not in acute distress.  HEENT Eyes- Scleraeral/Conjuntiva-bilat- Not Yellow. Mouth & Throat- Normal.  Chest and Lung  Exam Auscultation: Breath sounds:-Normal. Adventitious sounds:- No Adventitious sounds.  Cardiovascular Auscultation:Rythm - Regular. Heart Sounds -Normal heart sounds.  Abdomen Inspection:-Inspection Normal.  Palpation/Perucssion: Palpation and Percussion of the abdomen reveal- faint rt upper abdomen Tender, No Rebound tenderness, No rigidity(Guarding) and No Palpable abdominal masses.  Liver:-Normal.  Spleen:- Normal.   Rectal Anorectal Exam: Stool - Hemoccult of stool/mucous is Heme Negative. External - normal external exam. Internal - normal sphincter tone. No rectal mass.      Assessment & Plan:  For abdomen pain will order cbc, cmp, amylase, lipase, and h pylori.  Could add ranitidine otc.  Abd [place] order placed.   For your daily rt upper arm pain with occasional neck pain, recommend xray of cspine and upper arm.  You can decide to get later. Let me know if you change your mind.  Will refer to GI since some features of pain may be gallbladder related.(example periscapular pain)  Follow up in 10 days or as needed  Mark Hassey, Percell Miller, [employer]

## 2016-10-05 LAB — H. PYLORI BREATH TEST: H. PYLORI BREATH TEST: DETECTED — AB

## 2016-10-06 ENCOUNTER — Other Ambulatory Visit: Payer: Self-pay | Admitting: Medical

## 2016-10-06 ENCOUNTER — Ambulatory Visit
Admission: RE | Admit: 2016-10-06 | Discharge: 2016-10-06 | Disposition: A | Discharge status: Home / Self Care | Payer: BLUE CROSS/BLUE SHIELD | Source: Ambulatory Visit | Attending: Medical | Admitting: Medical

## 2016-10-06 DIAGNOSIS — R109 Unspecified abdominal pain: Secondary | ICD-10-CM

## 2016-10-06 MED ORDER — METRONIDAZOLE 500 MG PO TABS: 500.0000 mg | ORAL_TABLET | Freq: Three times a day (TID) | ORAL | 0 refills | Status: DC

## 2016-10-06 MED ORDER — CLARITHROMYCIN 500 MG PO TABS: 500.0000 mg | ORAL_TABLET | Freq: Two times a day (BID) | ORAL | 0 refills | Status: DC

## 2016-10-06 MED FILL — CLARITHROMYCIN 500 MG TAB: 500 | 14 days supply | Qty: 28 | Fill #0

## 2016-10-06 MED FILL — metroNIDAZOLE 500 MG TABS: 500 | 14 days supply | Qty: 42 | Fill #0

## 2016-10-06 NOTE — Telephone Encounter (Signed)
Pt is calling in to see if provider sent in a antibiotic that was discussed to the pharmacy. She would like a call back at : [phone]

## 2016-10-06 NOTE — Telephone Encounter (Signed)
I notified pt on antibiotics sent in.

## 2016-10-11 ENCOUNTER — Other Ambulatory Visit: Payer: Self-pay | Admitting: Family Medicine

## 2016-10-11 MED FILL — BISOPROLOL-HCTZ 5-6.25 MG T: 5-6.25 | 90 days supply | Qty: 90 | Fill #0

## 2016-10-11 MED FILL — GLIMEPIRIDE 2 MG TABLET: 2 | 30 days supply | Qty: 120 | Fill #1

## 2016-10-26 MED FILL — OMEPRAZOLE DR 40 MG CAPSULE: 40 | 30 days supply | Qty: 60 | Fill #1

## 2016-11-10 ENCOUNTER — Other Ambulatory Visit: Payer: Self-pay | Admitting: Family Medicine

## 2016-11-10 MED FILL — metFORMIN HCL 1000 MG TABS: 1000 | 30 days supply | Qty: 75 | Fill #0

## 2016-11-10 MED FILL — GLIMEPIRIDE 2 MG TABLET: 2 | 30 days supply | Qty: 120 | Fill #2

## 2016-11-15 ENCOUNTER — Encounter: Payer: Self-pay | Admitting: Family Medicine

## 2016-11-15 ENCOUNTER — Ambulatory Visit (INDEPENDENT_AMBULATORY_CARE_PROVIDER_SITE_OTHER): Payer: BLUE CROSS/BLUE SHIELD | Admitting: Family Medicine

## 2016-11-15 VITALS — BP 113/70 | HR 98 | Temp 98.1°F | Wt 196.4 lb

## 2016-11-15 DIAGNOSIS — K219 Gastro-esophageal reflux disease without esophagitis: Secondary | ICD-10-CM

## 2016-11-15 DIAGNOSIS — M546 Pain in thoracic spine: Secondary | ICD-10-CM

## 2016-11-15 DIAGNOSIS — E1169 Type 2 diabetes mellitus with other specified complication: Secondary | ICD-10-CM | POA: Diagnosis not present

## 2016-11-15 DIAGNOSIS — E782 Mixed hyperlipidemia: Secondary | ICD-10-CM | POA: Diagnosis not present

## 2016-11-15 DIAGNOSIS — I10 Essential (primary) hypertension: Secondary | ICD-10-CM

## 2016-11-15 DIAGNOSIS — E118 Type 2 diabetes mellitus with unspecified complications: Secondary | ICD-10-CM | POA: Diagnosis not present

## 2016-11-15 DIAGNOSIS — E669 Obesity, unspecified: Secondary | ICD-10-CM

## 2016-11-15 LAB — CBC
HCT: 40.5 % (ref 36.0–46.0)
Hemoglobin: 13.3 g/dL (ref 12.0–15.0)
MCHC: 33 g/dL (ref 30.0–36.0)
MCV: 82.4 fl (ref 78.0–100.0)
Platelets: 248 10*3/uL (ref 150.0–400.0)
RBC: 4.91 Mil/uL (ref 3.87–5.11)
RDW: 13.6 % (ref 11.5–15.5)
WBC: 5.7 10*3/uL (ref 4.0–10.5)

## 2016-11-15 LAB — COMPREHENSIVE METABOLIC PANEL
ALBUMIN: 4.3 g/dL (ref 3.5–5.2)
ALK PHOS: 69 U/L (ref 39–117)
ALT: 49 U/L — AB (ref 0–35)
AST: 31 U/L (ref 0–37)
BILIRUBIN TOTAL: 0.4 mg/dL (ref 0.2–1.2)
BUN: 15 mg/dL (ref 6–23)
CALCIUM: 9.7 mg/dL (ref 8.4–10.5)
CHLORIDE: 102 meq/L (ref 96–112)
CO2: 28 mEq/L (ref 19–32)
CREATININE: 0.87 mg/dL (ref 0.40–1.20)
GFR: 84.25 mL/min (ref >60.00)
Glucose, Bld: 179 mg/dL — ABNORMAL HIGH (ref 70–99)
Potassium: 4 mEq/L (ref 3.5–5.1)
Sodium: 140 mEq/L (ref 135–145)
TOTAL PROTEIN: 7.5 g/dL (ref 6.0–8.3)

## 2016-11-15 LAB — TSH: TSH: 1.04 u[IU]/mL (ref 0.35–4.50)

## 2016-11-15 LAB — HEMOGLOBIN A1C: Hgb A1c MFr Bld: 8.7 % — ABNORMAL HIGH (ref 4.6–6.5)

## 2016-11-15 LAB — LIPID PANEL
CHOLESTEROL: 238 mg/dL — AB (ref 0–200)
HDL: 41.6 mg/dL (ref >39.00)
LDL CALC: 163 mg/dL — AB (ref 0–99)
NonHDL: 196.63
TRIGLYCERIDES: 166 mg/dL — AB (ref 0.0–149.0)
Total CHOL/HDL Ratio: 6
VLDL: 33.2 mg/dL (ref 0.0–40.0)

## 2016-11-15 NOTE — Patient Instructions (Addendum)
MIND diet   Carbohydrate Counting for Diabetes Mellitus, Adult Carbohydrate counting is a method for keeping track of how many carbohydrates you eat. Eating carbohydrates naturally increases the amount of sugar (glucose) in the blood. Counting how many carbohydrates you eat helps keep your blood glucose within normal limits, which helps you manage your diabetes (diabetes mellitus). It is important to know how many carbohydrates you can safely have in each meal. This is different for every person. A diet and nutrition specialist (registered dietitian) can help you make a meal plan and calculate how many carbohydrates you should have at each meal and snack. Carbohydrates are found in the following foods:  Grains, such as breads and cereals.  Dried beans and soy products.  Starchy vegetables, such as potatoes, peas, and corn.  Fruit and fruit juices.  Milk and yogurt.  Sweets and snack foods, such as cake, cookies, candy, chips, and soft drinks.  How do I count carbohydrates? There are two ways to count carbohydrates in food. You can use either of the methods or a combination of both. Reading "Nutrition Facts" on packaged food The "Nutrition Facts" list is included on the labels of almost all packaged foods and beverages in the [place] It includes:  The serving size.  Information about nutrients in each serving, including the grams (g) of carbohydrate per serving.  To use the "Nutrition Facts":  Decide how many servings you will have.  Multiply the number of servings by the number of carbohydrates per serving.  The resulting number is the total amount of carbohydrates that you will be having.  Learning standard serving sizes of other foods When you eat foods containing carbohydrates that are not packaged or do not include "Nutrition Facts" on the label, you need to measure the servings in order to count the amount of carbohydrates:  Measure the foods that you will eat with a food  scale or measuring cup, if needed.  Decide how many standard-size servings you will eat.  Multiply the number of servings by 15. Most carbohydrate-rich foods have about 15 g of carbohydrates per serving. ? For example, if you eat 8 oz (170 g) of strawberries, you will have eaten 2 servings and 30 g of carbohydrates (2 servings x 15 g = 30 g).  For foods that have more than one food mixed, such as soups and casseroles, you must count the carbohydrates in each food that is included.  The following list contains standard serving sizes of common carbohydrate-rich foods. Each of these servings has about 15 g of carbohydrates:   hamburger bun or  English muffin.   oz (15 mL) syrup.   oz (14 g) jelly.  1 slice of bread.  1 six-inch tortilla.  3 oz (85 g) cooked rice or pasta.  4 oz (113 g) cooked dried beans.  4 oz (113 g) starchy vegetable, such as peas, corn, or potatoes.  4 oz (113 g) hot cereal.  4 oz (113 g) mashed potatoes or  of a large baked potato.  4 oz (113 g) canned or frozen fruit.  4 oz (120 mL) fruit juice.  4-6 crackers.  6 chicken nuggets.  6 oz (170 g) unsweetened dry cereal.  6 oz (170 g) plain fat-free yogurt or yogurt sweetened with artificial sweeteners.  8 oz (240 mL) milk.  8 oz (170 g) fresh fruit or one small piece of fruit.  24 oz (680 g) popped popcorn.  Example of carbohydrate counting Sample meal  3 oz (  85 g) chicken breast.  6 oz (170 g) brown rice.  4 oz (113 g) corn.  8 oz (240 mL) milk.  8 oz (170 g) strawberries with sugar-free whipped topping. Carbohydrate calculation 1. Identify the foods that contain carbohydrates: ? Rice. ? Corn. ? Milk. ? Strawberries. 2. Calculate how many servings you have of each food: ? 2 servings rice. ? 1 serving corn. ? 1 serving milk. ? 1 serving strawberries. 3. Multiply each number of servings by 15 g: ? 2 servings rice x 15 g = 30 g. ? 1 serving corn x 15 g = 15 g. ? 1 serving  milk x 15 g = 15 g. ? 1 serving strawberries x 15 g = 15 g. 4. Add together all of the amounts to find the total grams of carbohydrates eaten: ? 30 g + 15 g + 15 g + 15 g = 75 g of carbohydrates total. This information is not intended to replace advice given to you by your health care provider. Make sure you discuss any questions you have with your health care provider. Document Released: 05/31/2005 Document Revised: 12/19/2015 Document Reviewed: 11/12/2015 Elsevier Interactive Patient Education  [employer].

## 2016-11-15 NOTE — Progress Notes (Signed)
Pre visit review using our clinic review tool, if applicable. No additional management support is needed unless otherwise documented below in the visit note. 

## 2016-11-15 NOTE — Progress Notes (Signed)
Patient ID: [name], female   DOB: 01-23-1951, 66 y.o.   MRN: [id number]   Subjective:  I acted as a Education administrator for [name], [place], [place]   Patient ID: [name], female    DOB: August 02, 1950, 66 y.o.   MRN: [id number]  Chief Complaint  Patient presents with  . Back Pain    11-month F/U.  Marland Kitchen Toe Pain    "Little" toe on L foot. x2 months.    Back Pain  This is a chronic problem. The problem has been gradually improving since onset. The pain is at a severity of 0/10. The patient is experiencing no pain. Associated symptoms include abdominal pain. Pertinent negatives include no chest pain, fever or headaches. Risk factors include obesity. The treatment provided moderate relief.  Toe Pain   The injury mechanism is unknown. The pain is present in the left toes (Little toe.). The pain is at a severity of 2/10. It is unknown if a foreign body is present. The treatment provided mild relief.    Patient is in today for a 31-month follow up for back pain. Patient states that her back pain is gradually improving. Also states that she has been been having pain in her little toe on her left foot for the past two months; denies any injury. Patient has a Hx of HTN, OSA, GERD, Type II Diabetes, mixed hyperlipidemia. Patient has no additional acute concerns noted at this time. She still has some intermittent upper quadrant pain but is better and worse with position changes not associated with bowel movements or eating. Denies CP/palp/SOB/HA/congestion/fevers/GI or GU c/o. Taking meds as prescribed  Patient Care Team: Mosie Lukes, MD as PCP - General (Family Medicine) Jola Schmidt, MD as Consulting Physician (Ophthalmology)   Past Medical History:  Diagnosis Date  . Acute bronchitis 10/31/2015  . Allergy    seasonal  . Arthritis    knee-left  . Cataract    right eye  . Depression    job/stress related  . Diabetes mellitus 52   type 2  . Diaphragmatic hernia without mention of  obstruction or gangrene   . Endometriosis   . Esophageal reflux   . Esophageal stricture   . GERD (gastroesophageal reflux disease)   . Hyperlipidemia   . Hyperplastic colon polyp   . Hypertension 52  . Nonalcoholic fatty liver disease 04/28/2010   Qualifier: Diagnosis of  By: Arnoldo Morale MD, Balinda Quails   . Nonspecific abnormal results of liver function study   . Pharyngitis 02/18/2012  . Plantar fascial fibromatosis   . Preventative health care 01/22/2013  . Seasonal allergies 10/31/2015  . Sleep apnea    mouth piece per pt.  . Tubular adenoma of colon   . Unspecified sleep apnea    no c-pap    Past Surgical History:  Procedure Laterality Date  . ABDOMINAL HYSTERECTOMY  1982   partial  . APPENDECTOMY    . 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 BREAST CYST INCISION AND DRAINAGE     right breast  . CATARACT EXTRACTION     right eye  . HERNIA REPAIR     per pt, she is not aware of this surgery  . seba    . TUBAL LIGATION      Family History  Problem Relation Age of Onset  . Breast cancer Mother   . Hypertension Mother   . Diabetes Mother        type 2  .  Other Mother        esophagus surgery  . Heart disease Father   . Hyperlipidemia Father   . Hypertension Father   . Heart attack Father        MI at age 74  . Diabetes Brother        type 2  . Heart disease Maternal Grandmother   . Heart attack Maternal Grandfather   . Hypertension Maternal Grandfather   . Stroke Paternal Grandmother   . Hypertension Sister   . Diabetes Sister   . Hypertension Brother   . Alcohol abuse Maternal Aunt   . Colon cancer Neg Hx   . Stomach cancer Neg Hx   . Esophageal cancer Neg Hx     Social History   Social History  . Marital status: Married    Spouse name: [name]  . Number of children: 2  . 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 Smoking status: Never Smoker  . Smokeless tobacco: Never Used  . Alcohol  use No  . Drug use: No  . Sexual activity: Yes     Comment: lives with husband, no dietary restrictions just watching carbs, wears seat belt   Other Topics Concern  . Not on file   Social History Narrative  . No narrative on file    Outpatient Medications Prior to Visit  Medication Sig Dispense Refill  . ASPIR-LOW 81 MG EC tablet TAKE 1 TABLET BY MOUTH DAILY 30 tablet 3  . BAYER CONTOUR TEST test strip USE AS INSTRUCTED 100 each 1  . bisoprolol-hydrochlorothiazide (ZIAC) 5-6.25 MG tablet Take 1 tablet by mouth daily. 90 tablet 0  . bisoprolol-hydrochlorothiazide (ZIAC) 5-6.25 MG tablet TAKE 1 TABLET BY MOUTH DAILY. 90 tablet 0  . fluticasone (FLONASE) 50 MCG/ACT nasal spray Place 2 sprays into both nostrils daily. 16 g 1  . KRILL OIL PO Take 1 tablet by mouth daily.     . metFORMIN (GLUCOPHAGE) 1000 MG tablet TAKE 1 TABLET BY MOUTH TWICE A DAY AND TAKE 1/2 TABLET AT NOON 75 tablet 2  . omeprazole (PRILOSEC) 40 MG capsule TAKE 1 CAPSULE (40 MG) BY MOUTH 2 TIMES A DAY. 60 capsule 2  . Probiotic Product (PROBIOTIC DAILY PO) Take 1 capsule by mouth daily.    . ranitidine (ZANTAC) 150 MG capsule Take 1 capsule (150 mg total) by mouth 2 (two) times daily. 60 capsule 0  . traMADol (ULTRAM) 50 MG tablet Take 1 tablet (50 mg total) by mouth every 8 (eight) hours as needed. 30 tablet 0  . valACYclovir (VALTREX) 1000 MG tablet Take 1 tablet (1,000 mg total) by mouth 3 (three) times daily. 21 tablet 0  . glimepiride (AMARYL) 2 MG tablet Take 2 tablets twice daily 120 tablet 2  . clarithromycin (BIAXIN) 500 MG tablet Take 1 tablet (500 mg total) by mouth 2 (two) times daily. (Patient not taking: Reported on 11/15/2016) 28 tablet 0  . cyclobenzaprine (FLEXERIL) 10 MG tablet Take 1 tablet (10 mg total) by mouth at bedtime as needed for muscle spasms. (Patient not taking: Reported on 11/15/2016) 30 tablet 1  . hyoscyamine (LEVSIN, ANASPAZ) 0.125 MG tablet Take 1 tablet (0.125 mg total) by mouth every 6  (six) hours as needed. (Patient not taking: Reported on 11/15/2016) 30 tablet 0  . metroNIDAZOLE (FLAGYL) 500 MG tablet Take 1 tablet (500 mg total) by mouth 3 (three) times daily. (Patient not  taking: Reported on 11/15/2016) 42 tablet 0   No facility-administered medications prior to visit.     Allergies  Allergen Reactions  . Pravastatin Other (See Comments)    Myalgia, fatigue, memory loss  . Amoxicillin Itching    REACTION: rash  . Atorvastatin Other (See Comments)    Myalgia, fatigue, memory loss  . Codeine Nausea And Vomiting    Review of Systems  Constitutional: Negative for fever and malaise/fatigue.  HENT: Negative for congestion.   Eyes: Negative for blurred vision.  Respiratory: Negative for cough and shortness of breath.   Cardiovascular: Negative for chest pain, palpitations and leg swelling.  Gastrointestinal: Positive for abdominal pain. Negative for vomiting.  Musculoskeletal: Positive for back pain and joint pain.       Little toe on L foot.  Skin: Negative for rash.  Neurological: Negative for loss of consciousness and headaches.       Objective:    Physical Exam  Constitutional: She is oriented to person, place, and time. She appears well-developed and well-nourished. No distress.  HENT:  Head: Normocephalic and atraumatic.  Eyes: Conjunctivae are normal.  Neck: Normal range of motion. No thyromegaly present.  Cardiovascular: Normal rate and regular rhythm.   Pulmonary/Chest: Effort normal and breath sounds normal. She has no wheezes.  Abdominal: Soft. Bowel sounds are normal. There is no tenderness.  Musculoskeletal: She exhibits no edema or deformity.  Neurological: She is alert and oriented to person, place, and time.  Skin: Skin is warm and dry. She is not diaphoretic.  Psychiatric: She has a normal mood and affect.    BP 113/70 (BP Location: Left Leg, Patient Position: Sitting, Cuff Size: Normal)   Pulse 98   Temp 98.1 F (36.7 C) (Oral)   Wt  196 lb 6.4 oz (89.1 kg)   SpO2 99% Comment: RA  BMI 34.79 kg/m  Wt Readings from Last 3 Encounters:  11/15/16 196 lb 6.4 oz (89.1 kg)  10/04/16 196 lb (88.9 kg)  07/27/16 193 lb 6.4 oz (87.7 kg)   BP Readings from Last 3 Encounters:  11/15/16 113/70  07/27/16 100/66  07/09/16 110/60     Immunization History  Administered Date(s) Administered  . Influenza Split 03/22/2011, 02/17/2012  . Influenza Whole 03/22/2007, 03/19/2008, 04/28/2010  . Influenza,inj,Quad PF,36+ Mos 02/26/2013, 03/04/2014  . Pneumococcal Conjugate-13 05/28/2013  . Pneumococcal Polysaccharide-23 09/04/2015  . Td 10/12/2001  . Tdap 01/03/2012    Health Maintenance  Topic Date Due  . HIV Screening  09/08/1967  . OPHTHALMOLOGY EXAM  08/10/2016  . URINE MICROALBUMIN  12/03/2016  . INFLUENZA VACCINE  01/12/2017  . HEMOGLOBIN A1C  05/17/2017  . FOOT EXAM  07/09/2017  . MAMMOGRAM  02/01/2018  . PAP SMEAR  07/10/2019  . COLONOSCOPY  11/17/2020  . TETANUS/TDAP  01/02/2022  . Hepatitis C Screening  Completed    Lab Results  Component Value Date   WBC 5.7 11/15/2016   HGB 13.3 11/15/2016   HCT 40.5 11/15/2016   PLT 248.0 11/15/2016   GLUCOSE 179 (H) 11/15/2016   CHOL 238 (H) 11/15/2016   TRIG 166.0 (H) 11/15/2016   HDL 41.60 11/15/2016   LDLCALC 163 (H) 11/15/2016   ALT 49 (H) 11/15/2016   AST 31 11/15/2016   NA 140 11/15/2016   K 4.0 11/15/2016   CL 102 11/15/2016   CREATININE 0.87 11/15/2016   BUN 15 11/15/2016   CO2 28 11/15/2016   TSH 1.04 11/15/2016   HGBA1C 8.7 (H) 11/15/2016   MICROALBUR <  0.7 12/04/2015    Lab Results  Component Value Date   TSH 1.04 11/15/2016   Lab Results  Component Value Date   WBC 5.7 11/15/2016   HGB 13.3 11/15/2016   HCT 40.5 11/15/2016   MCV 82.4 11/15/2016   PLT 248.0 11/15/2016   Lab Results  Component Value Date   NA 140 11/15/2016   K 4.0 11/15/2016   CO2 28 11/15/2016   GLUCOSE 179 (H) 11/15/2016   BUN 15 11/15/2016   CREATININE 0.87  11/15/2016   BILITOT 0.4 11/15/2016   ALKPHOS 69 11/15/2016   AST 31 11/15/2016   ALT 49 (H) 11/15/2016   PROT 7.5 11/15/2016   ALBUMIN 4.3 11/15/2016   CALCIUM 9.7 11/15/2016   ANIONGAP 16 (H) 12/19/2013   GFR 84.25 11/15/2016   Lab Results  Component Value Date   CHOL 238 (H) 11/15/2016   Lab Results  Component Value Date   HDL 41.60 11/15/2016   Lab Results  Component Value Date   LDLCALC 163 (H) 11/15/2016   Lab Results  Component Value Date   TRIG 166.0 (H) 11/15/2016   Lab Results  Component Value Date   CHOLHDL 6 11/15/2016   Lab Results  Component Value Date   HGBA1C 8.7 (H) 11/15/2016         Assessment & Plan:   Problem List Items Addressed This Visit    GERD (Chronic)    Avoid offending foods, start probiotics. Do not eat large meals in late evening and consider raising head of bed.       Diabetes mellitus type 2 in obese (HCC)    Blood sugars up for several months, notes a high of 220 and a low 130 over past few weeks      Hyperlipidemia, mixed    Encouraged heart healthy diet, increase exercise, avoid trans fats, consider a krill oil cap daily      Relevant Orders   Lipid panel (Completed)   Essential hypertension    Well controlled, no changes to meds. Encouraged heart healthy diet such as the DASH diet and exercise as tolerated.       Relevant Orders   Comprehensive metabolic panel (Completed)   CBC (Completed)   TSH (Completed)   Thoracic back pain    Encouraged moist heat and gentle stretching as tolerated. May try NSAIDs and prescription meds as directed and report if symptoms worsen or seek immediate care       Other Visit Diagnoses    Type 2 diabetes mellitus with complication, unspecified whether long term insulin use ([place])    -  Primary   Relevant Orders   Hemoglobin A1c (Completed)      I have discontinued Ms. Manigo's cyclobenzaprine, hyoscyamine, clarithromycin, and metroNIDAZOLE. I am also having her maintain her  BAYER CONTOUR TEST, Probiotic Product (PROBIOTIC DAILY PO), KRILL OIL PO, ASPIR-LOW, fluticasone, traMADol, valACYclovir, bisoprolol-hydrochlorothiazide, omeprazole, ranitidine, bisoprolol-hydrochlorothiazide, and metFORMIN.  No orders of the defined types were placed in this encounter.   CMA served as Education administrator during this visit. History, Physical and Plan performed by medical provider. Documentation and orders reviewed and attested to.  [name], MD

## 2016-11-15 NOTE — Assessment & Plan Note (Signed)
Well controlled, no changes to meds. Encouraged heart healthy diet such as the DASH diet and exercise as tolerated.  °

## 2016-11-15 NOTE — Assessment & Plan Note (Signed)
Blood sugars up for several months, notes a high of 220 and a low 130 over past few weeks

## 2016-11-15 NOTE — Assessment & Plan Note (Signed)
Encouraged heart healthy diet, increase exercise, avoid trans fats, consider a krill oil cap daily 

## 2016-11-16 ENCOUNTER — Other Ambulatory Visit: Payer: Self-pay | Admitting: Family Medicine

## 2016-11-16 DIAGNOSIS — E785 Hyperlipidemia, unspecified: Secondary | ICD-10-CM

## 2016-11-16 DIAGNOSIS — E119 Type 2 diabetes mellitus without complications: Secondary | ICD-10-CM

## 2016-11-16 MED ORDER — GLIMEPIRIDE 4 MG PO TABS: 4.0000 mg | ORAL_TABLET | Freq: Two times a day (BID) | ORAL | 3 refills | Status: DC

## 2016-11-16 MED FILL — GLIMEPIRIDE 4 MG TABLET: 4 | 30 days supply | Qty: 60 | Fill #0

## 2016-11-20 NOTE — Assessment & Plan Note (Signed)
Avoid offending foods, start probiotics. Do not eat large meals in late evening and consider raising head of bed.  

## 2016-11-20 NOTE — Assessment & Plan Note (Signed)
Encouraged moist heat and gentle stretching as tolerated. May try NSAIDs and prescription meds as directed and report if symptoms worsen or seek immediate care 

## 2016-11-29 MED FILL — OMEPRAZOLE DR 40 MG CAPSULE: 40 | 30 days supply | Qty: 60 | Fill #2

## 2016-12-16 MED FILL — GLIMEPIRIDE 4 MG TABLET: 4 | 30 days supply | Qty: 60 | Fill #1

## 2016-12-16 MED FILL — metFORMIN HCL 1000 MG TABS: 1000 | 30 days supply | Qty: 75 | Fill #1

## 2017-01-11 ENCOUNTER — Other Ambulatory Visit: Payer: Self-pay | Admitting: Family Medicine

## 2017-01-11 ENCOUNTER — Other Ambulatory Visit: Payer: Self-pay | Admitting: Gastroenterology

## 2017-01-11 MED FILL — metFORMIN HCL 1000 MG TABS: 1000 | 30 days supply | Qty: 75 | Fill #2

## 2017-01-11 MED FILL — GLIMEPIRIDE 4 MG TABLET: 4 | 30 days supply | Qty: 60 | Fill #2

## 2017-01-11 MED FILL — OMEPRAZOLE DR 40 MG CAPSULE: 40 | 30 days supply | Qty: 60 | Fill #0

## 2017-01-11 MED FILL — BISOPROLOL-HCTZ 5-6.25 MG T: 5-6.25 | 90 days supply | Qty: 90 | Fill #0

## 2017-02-02 LAB — HM MAMMOGRAPHY

## 2017-02-16 ENCOUNTER — Other Ambulatory Visit (INDEPENDENT_AMBULATORY_CARE_PROVIDER_SITE_OTHER): Payer: BLUE CROSS/BLUE SHIELD

## 2017-02-16 DIAGNOSIS — E119 Type 2 diabetes mellitus without complications: Secondary | ICD-10-CM

## 2017-02-16 DIAGNOSIS — E785 Hyperlipidemia, unspecified: Secondary | ICD-10-CM | POA: Diagnosis not present

## 2017-02-16 LAB — COMPREHENSIVE METABOLIC PANEL
ALBUMIN: 3.9 g/dL (ref 3.5–5.2)
ALT: 68 U/L — ABNORMAL HIGH (ref 0–35)
AST: 53 U/L — ABNORMAL HIGH (ref 0–37)
Alkaline Phosphatase: 78 U/L (ref 39–117)
BUN: 13 mg/dL (ref 6–23)
CHLORIDE: 99 meq/L (ref 96–112)
CO2: 27 mEq/L (ref 19–32)
Calcium: 9.5 mg/dL (ref 8.4–10.5)
Creatinine, Ser: 0.85 mg/dL (ref 0.40–1.20)
GFR: 86.47 mL/min (ref >60.00)
Glucose, Bld: 216 mg/dL — ABNORMAL HIGH (ref 70–99)
POTASSIUM: 4 meq/L (ref 3.5–5.1)
SODIUM: 137 meq/L (ref 135–145)
Total Bilirubin: 0.4 mg/dL (ref 0.2–1.2)
Total Protein: 6.8 g/dL (ref 6.0–8.3)

## 2017-02-16 LAB — CBC
HCT: 38.5 % (ref 36.0–46.0)
HEMOGLOBIN: 12.7 g/dL (ref 12.0–15.0)
MCHC: 33.1 g/dL (ref 30.0–36.0)
MCV: 82.8 fl (ref 78.0–100.0)
PLATELETS: 221 10*3/uL (ref 150.0–400.0)
RBC: 4.65 Mil/uL (ref 3.87–5.11)
RDW: 13.6 % (ref 11.5–15.5)
WBC: 4.8 10*3/uL (ref 4.0–10.5)

## 2017-02-16 LAB — LIPID PANEL
CHOLESTEROL: 213 mg/dL — AB (ref 0–200)
HDL: 37.7 mg/dL — ABNORMAL LOW (ref >39.00)
LDL CALC: 145 mg/dL — AB (ref 0–99)
NONHDL: 174.89
Total CHOL/HDL Ratio: 6
Triglycerides: 150 mg/dL — ABNORMAL HIGH (ref 0.0–149.0)
VLDL: 30 mg/dL (ref 0.0–40.0)

## 2017-02-16 LAB — HEMOGLOBIN A1C: Hgb A1c MFr Bld: 9.3 % — ABNORMAL HIGH (ref 4.6–6.5)

## 2017-02-17 ENCOUNTER — Other Ambulatory Visit: Payer: Self-pay | Admitting: Family Medicine

## 2017-02-17 MED FILL — GLIMEPIRIDE 4 MG TABLET: 4 | 30 days supply | Qty: 60 | Fill #3

## 2017-02-18 ENCOUNTER — Encounter: Payer: Self-pay | Admitting: Family Medicine

## 2017-02-18 ENCOUNTER — Other Ambulatory Visit: Payer: Self-pay

## 2017-02-18 MED ORDER — SITAGLIPTIN PHOSPHATE 100 MG PO TABS: 100.0000 mg | ORAL_TABLET | Freq: Every day | ORAL | 3 refills | Status: DC

## 2017-02-18 MED ORDER — METFORMIN HCL 1000 MG PO TABS: ORAL_TABLET | ORAL | 0 refills | Status: DC

## 2017-02-18 MED FILL — JANUVIA 100 MG TABLET: 100 | 30 days supply | Qty: 30 | Fill #0

## 2017-02-18 MED FILL — metFORMIN HCL 1000 MG TABS: 1000 | 50 days supply | Qty: 75 | Fill #0

## 2017-02-18 NOTE — Addendum Note (Signed)
Addended by: [name] A on: 02/18/2017 11:38 AM   Modules accepted: Orders

## 2017-02-21 ENCOUNTER — Other Ambulatory Visit: Payer: Self-pay | Admitting: Family Medicine

## 2017-02-22 ENCOUNTER — Encounter: Payer: Self-pay | Admitting: Internal Medicine

## 2017-02-22 ENCOUNTER — Ambulatory Visit (INDEPENDENT_AMBULATORY_CARE_PROVIDER_SITE_OTHER): Payer: BLUE CROSS/BLUE SHIELD | Admitting: Internal Medicine

## 2017-02-22 VITALS — BP 118/62 | HR 89 | Ht 62.5 in | Wt 197.0 lb

## 2017-02-22 DIAGNOSIS — E1165 Type 2 diabetes mellitus with hyperglycemia: Secondary | ICD-10-CM | POA: Diagnosis not present

## 2017-02-22 MED ORDER — CANAGLIFLOZIN 100 MG PO TABS: 100.0000 mg | ORAL_TABLET | Freq: Every day | ORAL | 5 refills | Status: DC

## 2017-02-22 MED FILL — INVOKANA 100 MG TABLET: 100 | 30 days supply | Qty: 30 | Fill #0

## 2017-02-22 NOTE — Progress Notes (Signed)
Patient ID: [name], female   DOB: 03/30/1951, 65 y.o.   MRN: [id number]   HPI: [name] is a 66 y.o.-year-old female, referred by her PCP, Dr. Charlett Blake, for management of DM2, dx in 2001, non-insulin-dependent, uncontrolled, without long term complications.  Last hemoglobin A1c was: Lab Results  Component Value Date   HGBA1C 9.3 (H) 02/16/2017   HGBA1C 8.7 (H) 11/15/2016   HGBA1C 7.4 (H) 03/11/2016   Pt is on a regimen of: - Metformin 1000 mg 2x a day, with meals - Glimepiride 4 mg 2x a day right after meals - Januvia 100 mg daily before b'fast >> just started 02/21/2017 She also tried: Invokana  - cost was $$$. She has been off since 07/2016.  Pt checks her sugars 3x a day and they are: - am: 219-250 - 2h after b'fast: n/c - before lunch: low 200s - 2h after lunch: n/c - before dinner: 200-225 - 2h after dinner: n/c - bedtime: n/c - nighttime: n/c No lows. Lowest sugar was 187; she has hypoglycemia awareness at 100.  Highest sugar was 250.  Glucometer: Scientist, research (physical sciences)  Pt's meals are: - Breakfast: scrambled eggs + bacon or oatmeal - Lunch: sandwich + meat - Dinner: veggies, chicken, creamed potatoed - Snacks: 1 reg. soda a day, PB crackers, cookie  She is walking 3 times a week for exercise.  - no CKD, last BUN/creatinine:  Lab Results  Component Value Date   BUN 13 02/16/2017   BUN 15 11/15/2016   CREATININE 0.85 02/16/2017   CREATININE 0.87 11/15/2016   - + HL; last set of lipids: Lab Results  Component Value Date   CHOL 213 (H) 02/16/2017   HDL 37.70 (L) 02/16/2017   LDLCALC 145 (H) 02/16/2017   TRIG 150.0 (H) 02/16/2017   CHOLHDL 6 02/16/2017  Not on a statin >> had Myalgia, fatigue, memory loss - last eye exam was in 06/2016. No DR. Had cataract Sx'es. - no numbness and tingling in her feet.  Pt has FH of DM in mother, brother, sister.  She also has a history of NASH, HTN, HL, OSA.  ROS: Constitutional: + weight gain, + fatigue, +  subjective hyperthermia, + nocturia, + poor sleep  Eyes: no blurry vision, no xerophthalmia ENT: no sore throat, no nodules palpated in throat, no dysphagia/odynophagia, no hoarseness Cardiovascular: no CP/SOB/palpitations/+ leg swelling Respiratory: no cough/SOB Gastrointestinal: no N/V/D/C/+ AP Musculoskeletal: no muscle/+ joint aches Skin: no rashes Neurological: no tremors/numbness/tingling/dizziness Psychiatric: no depression/anxiety  Past Medical History:  Diagnosis Date  . Acute bronchitis 10/31/2015  . Allergy    seasonal  . Arthritis    knee-left  . Cataract    right eye  . Depression    job/stress related  . Diabetes mellitus 52   type 2  . Diaphragmatic hernia without mention of obstruction or gangrene   . Endometriosis   . Esophageal reflux   . Esophageal stricture   . GERD (gastroesophageal reflux disease)   . Hyperlipidemia   . Hyperplastic colon polyp   . Hypertension 52  . Nonalcoholic fatty liver disease 04/28/2010   Qualifier: Diagnosis of  By: Arnoldo Morale MD, Balinda Quails   . Nonspecific abnormal results of liver function study   . Pharyngitis 02/18/2012  . Plantar fascial fibromatosis   . Preventative health care 01/22/2013  . Seasonal allergies 10/31/2015  . Sleep apnea    mouth piece per pt.  . Tubular adenoma of colon   . Unspecified sleep apnea  no c-pap   Past Surgical History:  Procedure Laterality Date  . ABDOMINAL HYSTERECTOMY  1982   partial  . APPENDECTOMY    . 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 BREAST CYST INCISION AND DRAINAGE     right breast  . CATARACT EXTRACTION     right eye  . HERNIA REPAIR     per pt, she is not aware of this surgery  . seba    . TUBAL LIGATION     Social History   Social History  . Marital status: Married    Spouse name: [name]  . Number of children: 2  . 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 Smoking status: Never Smoker  .  Smokeless tobacco: Never Used  . Alcohol use No  . Drug use: No  . Sexual activity: Yes     Comment: lives with husband, no dietary restrictions just watching carbs, wears seat belt   Current Outpatient Prescriptions on File Prior to Visit  Medication Sig Dispense Refill  . ASPIR-LOW 81 MG EC tablet TAKE 1 TABLET BY MOUTH DAILY 30 tablet 3  . BAYER CONTOUR TEST test strip USE AS INSTRUCTED 100 each 1  . bisoprolol-hydrochlorothiazide (ZIAC) 5-6.25 MG tablet Take 1 tablet by mouth daily. 90 tablet 0  . glimepiride (AMARYL) 4 MG tablet Take 1 tablet (4 mg total) by mouth 2 (two) times daily. 60 tablet 3  . metFORMIN (GLUCOPHAGE) 1000 MG tablet TAKE 1 TABLET BY MOUTH TWICE A DAY AND TAKE 1/2 TABLET AT NOON 75 tablet 0  . omeprazole (PRILOSEC) 40 MG capsule TAKE 1 CAPSULE (40 MG) BY MOUTH 2 TIMES A DAY. 60 capsule 0  . sitaGLIPtin (JANUVIA) 100 MG tablet Take 1 tablet (100 mg total) by mouth daily. 30 tablet 3  . fluticasone (FLONASE) 50 MCG/ACT nasal spray Place 2 sprays into both nostrils daily. (Patient not taking: Reported on 02/22/2017) 16 g 1  . KRILL OIL PO Take 1 tablet by mouth daily.     . Probiotic Product (PROBIOTIC DAILY PO) Take 1 capsule by mouth daily.    . ranitidine (ZANTAC) 150 MG capsule Take 1 capsule (150 mg total) by mouth 2 (two) times daily. (Patient not taking: Reported on 02/22/2017) 60 capsule 0  . traMADol (ULTRAM) 50 MG tablet Take 1 tablet (50 mg total) by mouth every 8 (eight) hours as needed. (Patient not taking: Reported on 02/22/2017) 30 tablet 0  . valACYclovir (VALTREX) 1000 MG tablet Take 1 tablet (1,000 mg total) by mouth 3 (three) times daily. (Patient not taking: Reported on 02/22/2017) 21 tablet 0   No current facility-administered medications on file prior to visit.    Allergies  Allergen Reactions  . Pravastatin Other (See Comments)    Myalgia, fatigue, memory loss  . Amoxicillin Itching    REACTION: rash  . Atorvastatin Other (See Comments)     Myalgia, fatigue, memory loss  . Codeine Nausea And Vomiting   Family History  Problem Relation Age of Onset  . Breast cancer Mother   . Hypertension Mother   . Diabetes Mother        type 2  . Other Mother        esophagus surgery  . Heart disease Father   . Hyperlipidemia Father   . Hypertension Father   . Heart attack Father        MI  at age 38  . Diabetes Brother        type 2  . Heart disease Maternal Grandmother   . Heart attack Maternal Grandfather   . Hypertension Maternal Grandfather   . Stroke Paternal Grandmother   . Hypertension Sister   . Diabetes Sister   . Hypertension Brother   . Alcohol abuse Maternal Aunt   . Colon cancer Neg Hx   . Stomach cancer Neg Hx   . Esophageal cancer Neg Hx     PE: BP 118/62 (BP Location: Left Arm, Patient Position: Sitting)   Pulse 89   Ht 5' 2.5" (1.588 m)   Wt 197 lb (89.4 kg)   SpO2 97%   BMI 35.46 kg/m  Wt Readings from Last 3 Encounters:  02/22/17 197 lb (89.4 kg)  11/15/16 196 lb 6.4 oz (89.1 kg)  10/04/16 196 lb (88.9 kg)   Constitutional: overweight - truncal obesity, in NAD Eyes: PERRLA, EOMI, no exophthalmos ENT: moist mucous membranes, no thyromegaly, no cervical lymphadenopathy Cardiovascular: RRR, No MRG Respiratory: CTA B Gastrointestinal: abdomen soft, NT, ND, BS+ Musculoskeletal: no deformities, strength intact in all 4 Skin: moist, warm, no rashes Neurological: no tremor with outstretched hands, DTR normal in all 4  ASSESSMENT: 1. DM2, non-insulin-dependent, uncontrolled, without long term complications, but with hyperglycemia  PLAN:  1. Patient with long-standing, uncontrolled diabetes, on oral antidiabetic regimen, which became insufficient. Her sugars are very high in last few months, w/o a clear reason. Her diet is poor, high-fat with daily regular sodas. We discussed that she needs to improve her diet to a lower fat and lower concentrated sweets one, so we can improve her diabetes and also her  fatty liver. I gave her specific suggestions about a healthier diet and also written references. - however, for now, we will need to decrease her sugars before she starts her diet >> will add Invokana  - given coupon card - will also move Glimepiride and Januvia to before meals - I suggested to:  Patient Instructions  Please continue: - Metformin 1000 mg 2x a day with meals - Glimepiride 4 mg 2x a day but move it 15-30 min before meals - Januvia 100 mg daily but move it 15-30 min before b'fast  Try to add: - Invokana 100 mg 15-30 min before b'fast  Please return in 1.5 months with your sugar log.   Please let me know if the sugars are consistently <80 or >200.  - Strongly advised her to start checking sugars at different times of the day - check 2 times a day, rotating checks - given sugar log and advised how to fill it and to bring it at next appt  - given foot care handout and explained the principles  - given instructions for hypoglycemia management "15-15 rule"  - advised for yearly eye exams >> she is UTD - Return to clinic in 1.5 mo with sugar log   Philemon Kingdom, MD PhD [facility]

## 2017-02-22 NOTE — Patient Instructions (Addendum)
Please continue: - Metformin 1000 mg 2x a day with meals - Glimepiride 4 mg 2x a day but move it 15-30 min before meals - Januvia 100 mg daily but move it 15-30 min before b'fast  Try to add: - Invokana 100 mg 15-30 min before b'fast  Please return in 1.5 months with your sugar log.   Please let me know if the sugars are consistently <80 or >200.   PATIENT INSTRUCTIONS FOR TYPE 2 DIABETES:  DIET AND EXERCISE Diet and exercise is an important part of diabetic treatment.  We recommended aerobic exercise in the form of brisk walking (working between 40-60% of maximal aerobic capacity, similar to brisk walking) for 150 minutes per week (such as 30 minutes five days per week) along with 3 times per week performing 'resistance' training (using various gauge rubber tubes with handles) 5-10 exercises involving the major muscle groups (upper body, lower body and core) performing 10-15 repetitions (or near fatigue) each exercise. Start at half the above goal but build slowly to reach the above goals. If limited by weight, joint pain, or disability, we recommend daily walking in a swimming pool with water up to waist to reduce pressure from joints while allow for adequate exercise.    BLOOD GLUCOSES Monitoring your blood glucoses is important for continued management of your diabetes. Please check your blood glucoses 2-4 times a day: fasting, before meals and at bedtime (you can rotate these measurements - e.g. one day check before the 3 meals, the next day check before 2 of the meals and before bedtime, etc.).   HYPOGLYCEMIA (low blood sugar) Hypoglycemia is usually a reaction to not eating, exercising, or taking too much insulin/ other diabetes drugs.  Symptoms include tremors, sweating, hunger, confusion, headache, etc. Treat IMMEDIATELY with 15 grams of Carbs: . 4 glucose tablets .  cup regular juice/soda . 2 tablespoons raisins . 4 teaspoons sugar . 1 tablespoon honey Recheck blood glucose  in 15 mins and repeat above if still symptomatic/blood glucose <100.  RECOMMENDATIONS TO REDUCE YOUR RISK OF DIABETIC COMPLICATIONS: * Take your prescribed MEDICATION(S) * Follow a DIABETIC diet: Complex carbs, fiber rich foods, (monounsaturated and polyunsaturated) fats * AVOID saturated/trans fats, high fat foods, >2,300 mg salt per day. * EXERCISE at least 5 times a week for 30 minutes or preferably daily.  * DO NOT SMOKE OR DRINK more than 1 drink a day. * Check your FEET every day. Do not wear tightfitting shoes. Contact us if you develop an ulcer * See your EYE doctor once a year or more if needed * Get a FLU shot once a year * Get a PNEUMONIA vaccine once before and once after age 38 years  GOALS:  * Your Hemoglobin A1c of <7%  * fasting sugars need to be <130 * after meals sugars need to be <180 (2h after you start eating) * Your Systolic BP should be 295 or lower  * Your Diastolic BP should be 80 or lower  * Your HDL (Good Cholesterol) should be 40 or higher  * Your LDL (Bad Cholesterol) should be 100 or lower. * Your Triglycerides should be 150 or lower  * Your Urine microalbumin (kidney function) should be <30 * Your Body Mass Index should be 25 or lower    Please consider the following ways to cut down carbs and fat and increase fiber and micronutrients in your diet: - substitute whole grain for white bread or pasta - substitute brown rice for white  rice - substitute 90-calorie flat bread pieces for slices of bread when possible - substitute sweet potatoes or yams for white potatoes - substitute humus for margarine - substitute tofu for cheese when possible - substitute almond or rice milk for regular milk (would not drink soy milk daily due to concern for soy estrogen influence on breast cancer risk) - substitute dark chocolate for other sweets when possible - substitute water - can add lemon or orange slices for taste - for diet sodas (artificial sweeteners will trick  your body that you can eat sweets without getting calories and will lead you to overeating and weight gain in the long run) - do not skip breakfast or other meals (this will slow down the metabolism and will result in more weight gain over time)  - can try smoothies made from fruit and almond/rice milk in am instead of regular breakfast - can also try old-fashioned (not instant) oatmeal made with almond/rice milk in am - order the dressing on the side when eating salad at a restaurant (pour less than half of the dressing on the salad) - eat as little meat as possible - can try juicing, but should not forget that juicing will get rid of the fiber, so would alternate with eating raw veg./fruits or drinking smoothies - use as little oil as possible, even when using olive oil - can dress a salad with a mix of balsamic vinegar and lemon juice, for e.g. - use agave nectar, stevia sugar, or regular sugar rather than artificial sweateners - steam or broil/roast veggies  - snack on veggies/fruit/nuts (unsalted, preferably) when possible, rather than processed foods - reduce or eliminate aspartame in diet (it is in diet sodas, chewing gum, etc) Read the labels!  Try to read Dr. Janene Harvey book: "Program for Reversing Diabetes" for other ideas for healthy eating. Please look up "The Engine 2 diet" by Christa See.

## 2017-02-28 ENCOUNTER — Other Ambulatory Visit: Payer: Self-pay | Admitting: Internal Medicine

## 2017-02-28 MED ORDER — EMPAGLIFLOZIN 25 MG PO TABS: 25.0000 mg | ORAL_TABLET | Freq: Every day | ORAL | 5 refills | Status: DC

## 2017-02-28 MED FILL — JARDIANCE 25 MG TABLET: 25 | 30 days supply | Qty: 30 | Fill #0

## 2017-03-08 ENCOUNTER — Telehealth: Payer: Self-pay | Admitting: Internal Medicine

## 2017-03-08 ENCOUNTER — Telehealth: Payer: Self-pay

## 2017-03-08 ENCOUNTER — Other Ambulatory Visit: Payer: Self-pay

## 2017-03-08 MED ORDER — CANAGLIFLOZIN 100 MG PO TABS: 100.0000 mg | ORAL_TABLET | Freq: Every day | ORAL | 2 refills | Status: DC

## 2017-03-08 NOTE — Telephone Encounter (Signed)
Called and LVM advising patient of MD note, left call back number if any questions.

## 2017-03-08 NOTE — Telephone Encounter (Signed)
Please advise for patient.  Thank you! 

## 2017-03-08 NOTE — Telephone Encounter (Signed)
Not both!  Per my last note: Please continue: - Metformin 1000 mg 2x a day with meals - Glimepiride 4 mg 2x a day but move it 15-30 min before meals - Januvia 100 mg daily but move it 15-30 min before b'fast  Try to add: - Invokana 100 mg 15-30 min before b'fast  If Jardiance is covered instead of Invokana, can use that.

## 2017-03-08 NOTE — Telephone Encounter (Signed)
Patient would like a call back to clarify medications.  She is new to invokanana and wants to know if she should still be taking Jardiance?  Ty, -LL

## 2017-03-17 ENCOUNTER — Ambulatory Visit: Payer: BLUE CROSS/BLUE SHIELD | Admitting: Family Medicine

## 2017-03-22 ENCOUNTER — Other Ambulatory Visit: Payer: Self-pay | Admitting: Family Medicine

## 2017-03-22 MED FILL — JANUVIA 100 MG TABLET: 100 | 30 days supply | Qty: 30 | Fill #1

## 2017-03-22 MED FILL — INVOKANA 100 MG TABLET: 100 | 30 days supply | Qty: 30 | Fill #1

## 2017-03-25 MED FILL — GLIMEPIRIDE 4 MG TABLET: 4 | 30 days supply | Qty: 60 | Fill #0

## 2017-03-25 MED FILL — metFORMIN HCL 1000 MG TABS: 1000 | 30 days supply | Qty: 75 | Fill #0

## 2017-04-01 ENCOUNTER — Encounter: Payer: Self-pay | Admitting: Family Medicine

## 2017-04-01 ENCOUNTER — Ambulatory Visit (INDEPENDENT_AMBULATORY_CARE_PROVIDER_SITE_OTHER): Payer: BLUE CROSS/BLUE SHIELD | Admitting: Family Medicine

## 2017-04-01 VITALS — BP 118/72 | HR 60 | Temp 98.2°F | Resp 18 | Wt 194.6 lb

## 2017-04-01 DIAGNOSIS — Z23 Encounter for immunization: Secondary | ICD-10-CM | POA: Diagnosis not present

## 2017-04-01 DIAGNOSIS — I10 Essential (primary) hypertension: Secondary | ICD-10-CM

## 2017-04-01 DIAGNOSIS — E1165 Type 2 diabetes mellitus with hyperglycemia: Secondary | ICD-10-CM | POA: Diagnosis not present

## 2017-04-01 DIAGNOSIS — J029 Acute pharyngitis, unspecified: Secondary | ICD-10-CM

## 2017-04-01 LAB — HM PAP SMEAR: HM PAP: NOT DETECTED

## 2017-04-01 MED ORDER — OMEPRAZOLE 40 MG PO CPDR: DELAYED_RELEASE_CAPSULE | ORAL | 1 refills | Status: DC

## 2017-04-01 MED ORDER — METFORMIN HCL 1000 MG PO TABS: ORAL_TABLET | ORAL | 1 refills | Status: DC

## 2017-04-01 MED ORDER — ROSUVASTATIN CALCIUM 5 MG PO TABS: 5.0000 mg | ORAL_TABLET | ORAL | 2 refills | Status: DC

## 2017-04-01 MED FILL — ROSUVASTATIN CALCIUM 5 MG T: 5 | 30 days supply | Qty: 15 | Fill #0

## 2017-04-01 MED FILL — OMEPRAZOLE DR 40 MG CAPSULE: 40 | 30 days supply | Qty: 60 | Fill #0

## 2017-04-01 NOTE — Patient Instructions (Addendum)
Start the Rosuvastatin 5 mg on Tuesday and Saturday only  Encouraged increased rest and hydration, add probiotics, zinc such as Coldeze or Xicam. Treat fevers as needed. Vitamin C 500 to 1000 mg daily, elderberry liquid and aged or black garlic mucinex  Cholesterol Cholesterol is a fat. Your body needs a small amount of cholesterol. Cholesterol (plaque) may build up in your blood vessels (arteries). That makes you more likely to have a heart attack or stroke. You cannot feel your cholesterol level. Having a blood test is the only way to find out if your level is high. Keep your test results. Work with your doctor to keep your cholesterol at a good level. What do the results mean?  Total cholesterol is how much cholesterol is in your blood.  LDL is bad cholesterol. This is the type that can build up. Try to have low LDL.  HDL is good cholesterol. It cleans your blood vessels and carries LDL away. Try to have high HDL.  Triglycerides are fat that the body can store or burn for energy. What are good levels of cholesterol?  Total cholesterol below 200.  LDL below 100 is good for people who have health risks. LDL below 70 is good for people who have very high risks.  HDL above 40 is good. It is best to have HDL of 60 or higher.  Triglycerides below 150. How can I lower my cholesterol? Diet Follow your diet program as told by your doctor.  Choose fish, white meat chicken, or [place] that is roasted or baked. Try not to eat red meat, fried foods, sausage, or lunch meats.  Eat lots of fresh fruits and vegetables.  Choose whole grains, beans, pasta, potatoes, and cereals.  Choose olive oil, corn oil, or canola oil. Only use small amounts.  Try not to eat butter, mayonnaise, shortening, or palm kernel oils.  Try not to eat foods with trans fats.  Choose low-fat or nonfat dairy foods. ? Drink skim or nonfat milk. ? Eat low-fat or nonfat yogurt and cheeses. ? Try not to drink whole  milk or cream. ? Try not to eat ice cream, egg yolks, or full-fat cheeses.  Healthy desserts include angel food cake, ginger snaps, animal crackers, hard candy, popsicles, and low-fat or nonfat frozen yogurt. Try not to eat pastries, cakes, pies, and cookies.  Exercise Follow your exercise program as told by your doctor.  Be more active. Try gardening, walking, and taking the stairs.  Ask your doctor about ways that you can be more active.  Medicine  Take over-the-counter and prescription medicines only as told by your doctor.  This information is not intended to replace advice given to you by your health care provider. Make sure you discuss any questions you have with your health care provider. Document Released: 08/27/2008 Document Revised: 12/31/2015 Document Reviewed: 12/11/2015 Elsevier Interactive Patient Education  2017 [employer].

## 2017-04-01 NOTE — Progress Notes (Signed)
Subjective:  I acted as a Education administrator for Dr. Charlett Blake. [place], [place]  Patient ID: [name], female    DOB: 1950/10/02, 66 y.o.   MRN: [id number]  No chief complaint on file.   HPI  Patient is in today for a 4 month follow up and she reports feeling well. She has established with [facility] with Dr Alfred Levins. She is also seeing endocrinology with dr Cruzita Lederer and has her next appointment in November. She has had a sore throat this week and the first day of symptoms she had fevers and chills but that has resolved. No polyuria or polydipsia. Sugars are improved. Denies CP/palp/SOB/HA/congestion/fevers/GI or GU c/o. Taking meds as prescribed  Patient Care Team: Mosie Lukes, MD as PCP - General (Family Medicine) Jola Schmidt, MD as Consulting Physician (Ophthalmology)   Past Medical History:  Diagnosis Date  . Acute bronchitis 10/31/2015  . Allergy    seasonal  . Arthritis    knee-left  . Cataract    right eye  . Depression    job/stress related  . Diabetes mellitus 52   type 2  . Diaphragmatic hernia without mention of obstruction or gangrene   . Endometriosis   . Esophageal reflux   . Esophageal stricture   . GERD (gastroesophageal reflux disease)   . Hyperlipidemia   . Hyperplastic colon polyp   . Hypertension 52  . Nonalcoholic fatty liver disease 04/28/2010   Qualifier: Diagnosis of  By: Arnoldo Morale MD, Balinda Quails   . Nonspecific abnormal results of liver function study   . Pharyngitis 02/18/2012  . Plantar fascial fibromatosis   . Preventative health care 01/22/2013  . Seasonal allergies 10/31/2015  . Sleep apnea    mouth piece per pt.  . Sore throat 04/03/2017  . Tubular adenoma of colon   . Unspecified sleep apnea    no c-pap    Past Surgical History:  Procedure Laterality Date  . ABDOMINAL HYSTERECTOMY  1982   partial  . APPENDECTOMY    . 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 BREAST CYST INCISION AND DRAINAGE     right breast  . CATARACT  EXTRACTION     right eye  . HERNIA REPAIR     per pt, she is not aware of this surgery  . seba    . TUBAL LIGATION      Family History  Problem Relation Age of Onset  . Breast cancer Mother   . Hypertension Mother   . Diabetes Mother        type 2  . Other Mother        esophagus surgery  . Heart disease Father   . Hyperlipidemia Father   . Hypertension Father   . Heart attack Father        MI at age 3  . Diabetes Brother        type 2  . Heart disease Maternal Grandmother   . Heart attack Maternal Grandfather   . Hypertension Maternal Grandfather   . Stroke Paternal Grandmother   . Hypertension Sister   . Diabetes Sister   . Hypertension Brother   . Alcohol abuse Maternal Aunt   . Colon cancer Neg Hx   . Stomach cancer Neg Hx   . Esophageal cancer Neg Hx     Social History   Social History  . Marital status: Married    Spouse name: [name]  . Number of children: 2  .  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 Smoking status: Never Smoker  . Smokeless tobacco: Never Used  . Alcohol use No  . Drug use: No  . Sexual activity: Yes     Comment: lives with husband, no dietary restrictions just watching carbs, wears seat belt   Other Topics Concern  . Not on file   Social History Narrative  . No narrative on file    Outpatient Medications Prior to Visit  Medication Sig Dispense Refill  . ASPIR-LOW 81 MG EC tablet TAKE 1 TABLET BY MOUTH DAILY 30 tablet 3  . BAYER CONTOUR TEST test strip USE AS INSTRUCTED 100 each 1  . bisoprolol-hydrochlorothiazide (ZIAC) 5-6.25 MG tablet Take 1 tablet by mouth daily. 90 tablet 0  . canagliflozin (INVOKANA) 100 MG TABS tablet Take 1 tablet (100 mg total) by mouth daily before breakfast. 30 tablet 2  . glimepiride (AMARYL) 4 MG tablet TAKE 1 TABLET BY MOUTH TWICE DAILY 60 tablet 3  . KRILL OIL PO Take 1 tablet by mouth daily.     . Probiotic Product  (PROBIOTIC DAILY PO) Take 1 capsule by mouth daily.    . sitaGLIPtin (JANUVIA) 100 MG tablet Take 1 tablet (100 mg total) by mouth daily. 30 tablet 3  . fluticasone (FLONASE) 50 MCG/ACT nasal spray Place 2 sprays into both nostrils daily. (Patient not taking: Reported on 02/22/2017) 16 g 1  . metFORMIN (GLUCOPHAGE) 1000 MG tablet TAKE 1 TABLET BY MOUTH TWICE A DAY AND TAKE 1/2 TABLET AT NOON 75 tablet 0  . omeprazole (PRILOSEC) 40 MG capsule TAKE 1 CAPSULE (40 MG) BY MOUTH 2 TIMES A DAY. 60 capsule 0  . ranitidine (ZANTAC) 150 MG capsule Take 1 capsule (150 mg total) by mouth 2 (two) times daily. (Patient not taking: Reported on 02/22/2017) 60 capsule 0  . traMADol (ULTRAM) 50 MG tablet Take 1 tablet (50 mg total) by mouth every 8 (eight) hours as needed. (Patient not taking: Reported on 02/22/2017) 30 tablet 0  . valACYclovir (VALTREX) 1000 MG tablet Take 1 tablet (1,000 mg total) by mouth 3 (three) times daily. (Patient not taking: Reported on 02/22/2017) 21 tablet 0   No facility-administered medications prior to visit.     Allergies  Allergen Reactions  . Pravastatin Other (See Comments)    Myalgia, fatigue, memory loss  . Amoxicillin Itching    REACTION: rash  . Atorvastatin Other (See Comments)    Myalgia, fatigue, memory loss  . Codeine Nausea And Vomiting    Review of Systems  Constitutional: Negative for fever and malaise/fatigue.  HENT: Negative for congestion.   Eyes: Negative for blurred vision.  Respiratory: Negative for shortness of breath.   Cardiovascular: Negative for chest pain, palpitations and leg swelling.  Gastrointestinal: Negative for abdominal pain, blood in stool and nausea.  Genitourinary: Negative for dysuria and frequency.  Musculoskeletal: Negative for falls.  Skin: Negative for rash.  Neurological: Negative for dizziness, loss of consciousness and headaches.  Endo/Heme/Allergies: Negative for environmental allergies.  Psychiatric/Behavioral: Negative for  depression. The patient is not nervous/anxious.        Objective:    Physical Exam  Constitutional: She is oriented to person, place, and time. She appears well-developed and well-nourished. No distress.  HENT:  Head: Normocephalic and atraumatic.  Nose: Nose normal.  Eyes: Right eye exhibits no discharge. Left eye exhibits no discharge.  Neck: Normal range of motion.  Neck supple.  Cardiovascular: Normal rate and regular rhythm.   No murmur heard. Pulmonary/Chest: Effort normal and breath sounds normal.  Abdominal: Soft. Bowel sounds are normal. There is no tenderness.  Musculoskeletal: She exhibits no edema.  Neurological: She is alert and oriented to person, place, and time.  Skin: Skin is warm and dry.  Psychiatric: She has a normal mood and affect.  Nursing note and vitals reviewed.   BP 118/72 (BP Location: Left Arm, Patient Position: Sitting, Cuff Size: Normal)   Pulse 60   Temp 98.2 F (36.8 C) (Oral)   Resp 18   Wt 194 lb 9.6 oz (88.3 kg)   SpO2 99%   BMI 35.03 kg/m  Wt Readings from Last 3 Encounters:  04/01/17 194 lb 9.6 oz (88.3 kg)  02/22/17 197 lb (89.4 kg)  11/15/16 196 lb 6.4 oz (89.1 kg)   BP Readings from Last 3 Encounters:  04/01/17 118/72  02/22/17 118/62  11/15/16 113/70     Immunization History  Administered Date(s) Administered  . Influenza Split 03/22/2011, 02/17/2012  . Influenza Whole 03/22/2007, 03/19/2008, 04/28/2010  . Influenza,inj,Quad PF,6+ Mos 02/26/2013, 03/04/2014, 04/01/2017  . Pneumococcal Conjugate-13 05/28/2013  . Pneumococcal Polysaccharide-23 09/04/2015  . Td 10/12/2001  . Tdap 01/03/2012    Health Maintenance  Topic Date Due  . HIV Screening  09/08/1967  . OPHTHALMOLOGY EXAM  08/10/2016  . URINE MICROALBUMIN  12/03/2016  . FOOT EXAM  07/09/2017  . HEMOGLOBIN A1C  08/16/2017  . MAMMOGRAM  02/03/2019  . PAP SMEAR  07/10/2019  . COLONOSCOPY  11/17/2020  . TETANUS/TDAP  01/02/2022  . INFLUENZA VACCINE  Completed    . Hepatitis C Screening  Completed    Lab Results  Component Value Date   WBC 4.8 02/16/2017   HGB 12.7 02/16/2017   HCT 38.5 02/16/2017   PLT 221.0 02/16/2017   GLUCOSE 216 (H) 02/16/2017   CHOL 213 (H) 02/16/2017   TRIG 150.0 (H) 02/16/2017   HDL 37.70 (L) 02/16/2017   LDLCALC 145 (H) 02/16/2017   ALT 68 (H) 02/16/2017   AST 53 (H) 02/16/2017   NA 137 02/16/2017   K 4.0 02/16/2017   CL 99 02/16/2017   CREATININE 0.85 02/16/2017   BUN 13 02/16/2017   CO2 27 02/16/2017   TSH 1.04 11/15/2016   HGBA1C 9.3 (H) 02/16/2017   MICROALBUR <0.7 12/04/2015    Lab Results  Component Value Date   TSH 1.04 11/15/2016   Lab Results  Component Value Date   WBC 4.8 02/16/2017   HGB 12.7 02/16/2017   HCT 38.5 02/16/2017   MCV 82.8 02/16/2017   PLT 221.0 02/16/2017   Lab Results  Component Value Date   NA 137 02/16/2017   K 4.0 02/16/2017   CO2 27 02/16/2017   GLUCOSE 216 (H) 02/16/2017   BUN 13 02/16/2017   CREATININE 0.85 02/16/2017   BILITOT 0.4 02/16/2017   ALKPHOS 78 02/16/2017   AST 53 (H) 02/16/2017   ALT 68 (H) 02/16/2017   PROT 6.8 02/16/2017   ALBUMIN 3.9 02/16/2017   CALCIUM 9.5 02/16/2017   ANIONGAP 16 (H) 12/19/2013   GFR 86.47 02/16/2017   Lab Results  Component Value Date   CHOL 213 (H) 02/16/2017   Lab Results  Component Value Date   HDL 37.70 (L) 02/16/2017   Lab Results  Component Value Date   LDLCALC 145 (H) 02/16/2017   Lab Results  Component Value Date   TRIG 150.0 (H) 02/16/2017   Lab  Results  Component Value Date   CHOLHDL 6 02/16/2017   Lab Results  Component Value Date   HGBA1C 9.3 (H) 02/16/2017         Assessment & Plan:   Problem List Items Addressed This Visit    Type 2 diabetes mellitus with hyperglycemia, without long-term current use of insulin ([place])    Is following with endocrinology now and she reports her sugars have improved. Generally they are below 150 although this am was 189. Minimize simple carbs. No  change in meds.       Relevant Medications   metFORMIN (GLUCOPHAGE) 1000 MG tablet   rosuvastatin (CRESTOR) 5 MG tablet   Essential hypertension    Well controlled, no changes to meds. Encouraged heart healthy diet such as the DASH diet and exercise as tolerated.       Relevant Medications   rosuvastatin (CRESTOR) 5 MG tablet   Sore throat    Likely viral. Encouraged increased rest and hydration, add probiotics, zinc such as Coldeze or Xicam. Treat fevers as needed. Vitamin C and elderberry        Other Visit Diagnoses    Needs flu shot    -  Primary   Relevant Orders   Flu Vaccine QUAD 6+ mos PF IM (Fluarix Quad PF) (Completed)      I have discontinued Ms. Coonrod's fluticasone, traMADol, valACYclovir, and ranitidine. I am also having her start on rosuvastatin. Additionally, I am having her maintain her BAYER CONTOUR TEST, Probiotic Product (PROBIOTIC DAILY PO), KRILL OIL PO, ASPIR-LOW, bisoprolol-hydrochlorothiazide, sitaGLIPtin, canagliflozin, glimepiride, metFORMIN, and omeprazole.  Meds ordered this encounter  Medications  . metFORMIN (GLUCOPHAGE) 1000 MG tablet    Sig: TAKE 1 TABLET BY MOUTH TWICE A DAY AND TAKE 1/2 TABLET AT NOON    Dispense:  75 tablet    Refill:  1  . omeprazole (PRILOSEC) 40 MG capsule    Sig: TAKE 1 CAPSULE (40 MG) BY MOUTH 2 TIMES A DAY.    Dispense:  60 capsule    Refill:  1  . rosuvastatin (CRESTOR) 5 MG tablet    Sig: Take 1 tablet (5 mg total) by mouth every other day.    Dispense:  15 tablet    Refill:  2    CMA served as scribe during this visit. History, Physical and Plan performed by medical provider. Documentation and orders reviewed and attested to.  [name], MD

## 2017-04-03 ENCOUNTER — Encounter: Payer: Self-pay | Admitting: Family Medicine

## 2017-04-03 DIAGNOSIS — J029 Acute pharyngitis, unspecified: Secondary | ICD-10-CM

## 2017-04-03 HISTORY — DX: Acute pharyngitis, unspecified: J02.9

## 2017-04-03 NOTE — Assessment & Plan Note (Signed)
Likely viral. Encouraged increased rest and hydration, add probiotics, zinc such as Coldeze or Xicam. Treat fevers as needed. Vitamin C and elderberry

## 2017-04-03 NOTE — Assessment & Plan Note (Signed)
Is following with endocrinology now and she reports her sugars have improved. Generally they are below 150 although this am was 189. Minimize simple carbs. No change in meds.

## 2017-04-03 NOTE — Assessment & Plan Note (Signed)
Well controlled, no changes to meds. Encouraged heart healthy diet such as the DASH diet and exercise as tolerated.  °

## 2017-04-12 ENCOUNTER — Other Ambulatory Visit: Payer: Self-pay | Admitting: Family Medicine

## 2017-04-12 MED FILL — BISOPROLOL-HCTZ 5-6.25 MG T: 5-6.25 | 90 days supply | Qty: 90 | Fill #0

## 2017-04-19 ENCOUNTER — Ambulatory Visit: Payer: BLUE CROSS/BLUE SHIELD | Admitting: Internal Medicine

## 2017-04-22 MED FILL — INVOKANA 100 MG TABLET: 100 | 30 days supply | Qty: 30 | Fill #2

## 2017-04-22 MED FILL — JANUVIA 100 MG TABLET: 100 | 30 days supply | Qty: 30 | Fill #2

## 2017-05-02 ENCOUNTER — Encounter: Payer: Self-pay | Admitting: Internal Medicine

## 2017-05-02 ENCOUNTER — Ambulatory Visit: Payer: BLUE CROSS/BLUE SHIELD | Admitting: Internal Medicine

## 2017-05-02 VITALS — BP 126/78 | HR 97 | Temp 98.4°F | Resp 16 | Wt 194.6 lb

## 2017-05-02 DIAGNOSIS — E669 Obesity, unspecified: Secondary | ICD-10-CM | POA: Diagnosis not present

## 2017-05-02 DIAGNOSIS — E1165 Type 2 diabetes mellitus with hyperglycemia: Secondary | ICD-10-CM | POA: Diagnosis not present

## 2017-05-02 DIAGNOSIS — E782 Mixed hyperlipidemia: Secondary | ICD-10-CM | POA: Diagnosis not present

## 2017-05-02 MED ORDER — GLIMEPIRIDE 4 MG PO TABS: 2.0000 mg | ORAL_TABLET | Freq: Two times a day (BID) | ORAL | 3 refills | Status: DC

## 2017-05-02 NOTE — Progress Notes (Signed)
Patient ID: [name], female   DOB: [birth year], 66 y.o.   MRN: [id number]   HPI: [name] is a 66 y.o.-year-old female, returning for f/u for DM2, dx in 2001, non-insulin-dependent, uncontrolled, without long term complications. Last visit 2 mo ago.  Last hemoglobin A1c was: Lab Results  Component Value Date   HGBA1C 9.3 (H) 02/16/2017   HGBA1C 8.7 (H) 11/15/2016   HGBA1C 7.4 (H) 03/11/2016   Pt is on a regimen of: - Metformin 1000 mg 2x a day, with meals - Glimepiride 4 mg 2x a day right after meals - Januvia 100 mg daily before b'fast - Invokana 100 mg before b'fast - started 02/2017  Pt checks her sugars 2-3x a day and they are: - am: 219-250 >> 92-130, 140 - 2h after b'fast: n/c >> 98-155, 200 - before lunch: low 200s >> 47 x1 (exercise after b'fast), 80-145, 202 - 2h after lunch: n/c >> 104-160 - before dinner: 200-225 >> 75-152, 184 - 2h after dinner: n/c >> 73 (exercise), 124-163 - bedtime: n/c - nighttime: n/c Lowest sugar was 187 >> 47; she has hypoglycemia awareness at 100.  Highest sugar was 250 >> 202  Glucometer: Scientist, research (physical sciences)  Pt's meals are: - Breakfast: scrambled eggs + bacon or oatmeal - Lunch: sandwich + meat - Dinner: veggies, chicken, creamed potatoed - Snacks: 1 reg. soda a day, PB crackers, cookie  She is walking 3x a week for exercise.  - No CKD, last BUN/creatinine:  Lab Results  Component Value Date   BUN 13 02/16/2017   BUN 15 11/15/2016   CREATININE 0.85 02/16/2017   CREATININE 0.87 11/15/2016   - + HL; last set of lipids: Lab Results  Component Value Date   CHOL 213 (H) 02/16/2017   HDL 37.70 (L) 02/16/2017   LDLCALC 145 (H) 02/16/2017   TRIG 150.0 (H) 02/16/2017   CHOLHDL 6 02/16/2017  Was on a statin >> had myalgia, fatigue, memory loss.  - last eye exam was in 06/2016 >> No DR. Had cataract Sx'es. - No numbness and tingling in her feet.  She also has a history of NASH, HTN, HL, OSA.  ROS: Constitutional:  no weight gain/no weight loss, + fatigue, no subjective hyperthermia, no subjective , + nocturia Eyes: no blurry vision, no xerophthalmia ENT: no sore throat, no nodules palpated in throat, no dysphagia, no odynophagia, no hoarseness Cardiovascular: no CP/no SOB/no palpitations/no leg swelling Respiratory: no cough/no SOB/no wheezing Gastrointestinal: no N/no V/no D/+ C/no acid reflux Musculoskeletal: + muscle aches/+ joint aches Skin: no rashes, no hair loss Neurological: no tremors/no numbness/no tingling/no dizziness  I reviewed pt's medications, allergies, PMH, social hx, family hx, and changes were documented in the history of present illness. Otherwise, unchanged from my initial visit note.  Past Medical History:  Diagnosis Date  . Acute bronchitis 10/31/2015  . Allergy    seasonal  . Arthritis    knee-left  . Cataract    right eye  . Depression    job/stress related  . Diabetes mellitus 66   type 2  . Diaphragmatic hernia without mention of obstruction or gangrene   . Endometriosis   . Esophageal reflux   . Esophageal stricture   . GERD (gastroesophageal reflux disease)   . Hyperlipidemia   . Hyperplastic colon polyp   . Hypertension 66  . Nonalcoholic fatty liver disease 04/28/2010   Qualifier: Diagnosis of  By: Arnoldo Morale MD, Balinda Quails   . Nonspecific abnormal results of liver function  study   . Pharyngitis 02/18/2012  . Plantar fascial fibromatosis   . Preventative health care 01/22/2013  . Seasonal allergies 10/31/2015  . Sleep apnea    mouth piece per pt.  . Sore throat 04/03/2017  . Tubular adenoma of colon   . Unspecified sleep apnea    no c-pap   Past Surgical History:  Procedure Laterality Date  . ABDOMINAL HYSTERECTOMY  1982   partial  . APPENDECTOMY    . 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 BREAST CYST INCISION AND DRAINAGE     right breast  . CATARACT EXTRACTION     right eye  . HERNIA REPAIR     per pt, she is not aware of this surgery  . seba     . TUBAL LIGATION     Social History   Social History  . Marital status: Married    Spouse name: [name]  . Number of children: 2  . 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 Smoking status: Never Smoker  . Smokeless tobacco: Never Used  . Alcohol use No  . Drug use: No  . Sexual activity: Yes     Comment: lives with husband, no dietary restrictions just watching carbs, wears seat belt   Current Outpatient Medications on File Prior to Visit  Medication Sig Dispense Refill  . ASPIR-LOW 81 MG EC tablet TAKE 1 TABLET BY MOUTH DAILY 30 tablet 3  . BAYER CONTOUR TEST test strip USE AS INSTRUCTED 100 each 1  . bisoprolol-hydrochlorothiazide (ZIAC) 5-6.25 MG tablet Take 1 tablet by mouth daily. 90 tablet 0  . bisoprolol-hydrochlorothiazide (ZIAC) 5-6.25 MG tablet TAKE 1 TABLET BY MOUTH DAILY. 90 tablet 0  . canagliflozin (INVOKANA) 100 MG TABS tablet Take 1 tablet (100 mg total) by mouth daily before breakfast. 30 tablet 2  . glimepiride (AMARYL) 4 MG tablet TAKE 1 TABLET BY MOUTH TWICE DAILY 60 tablet 3  . KRILL OIL PO Take 1 tablet by mouth daily.     . metFORMIN (GLUCOPHAGE) 1000 MG tablet TAKE 1 TABLET BY MOUTH TWICE A DAY AND TAKE 1/2 TABLET AT NOON 75 tablet 1  . omeprazole (PRILOSEC) 40 MG capsule TAKE 1 CAPSULE (40 MG) BY MOUTH 2 TIMES A DAY. 60 capsule 1  . Probiotic Product (PROBIOTIC DAILY PO) Take 1 capsule by mouth daily.    . rosuvastatin (CRESTOR) 5 MG tablet Take 1 tablet (5 mg total) by mouth every other day. 15 tablet 2  . sitaGLIPtin (JANUVIA) 100 MG tablet Take 1 tablet (100 mg total) by mouth daily. 30 tablet 3   No current facility-administered medications on file prior to visit.    Allergies  Allergen Reactions  . Pravastatin Other (See Comments)    Myalgia, fatigue, memory loss  . Amoxicillin Itching    REACTION: rash  . Atorvastatin Other (See Comments)    Myalgia, fatigue, memory loss   . Codeine Nausea And Vomiting   Family History  Problem Relation Age of Onset  . Breast cancer Mother   . Hypertension Mother   . Diabetes Mother        type 2  . Other Mother        esophagus surgery  . Heart disease Father   . Hyperlipidemia Father   . Hypertension Father   . Heart attack Father        MI at age  61  . Diabetes Brother        type 2  . Heart disease Maternal Grandmother   . Heart attack Maternal Grandfather   . Hypertension Maternal Grandfather   . Stroke Paternal Grandmother   . Hypertension Sister   . Diabetes Sister   . Hypertension Brother   . Alcohol abuse Maternal Aunt   . Colon cancer Neg Hx   . Stomach cancer Neg Hx   . Esophageal cancer Neg Hx    Pt has FH of DM in mother, brother, sister.  PE: BP 126/78 (BP Location: Right Arm, Patient Position: Sitting, Cuff Size: Normal)   Pulse 97   Temp 98.4 F (36.9 C) (Oral)   Resp 16   Wt 194 lb 9.6 oz (88.3 kg)   SpO2 96%   BMI 35.03 kg/m   Wt Readings from Last 3 Encounters:  05/02/17 194 lb 9.6 oz (88.3 kg)  04/01/17 194 lb 9.6 oz (88.3 kg)  02/22/17 197 lb (89.4 kg)   Constitutional: overweight, in NAD Eyes: PERRLA, EOMI, no exophthalmos ENT: moist mucous membranes, no thyromegaly, no cervical lymphadenopathy Cardiovascular: tachycardia, RR, No MRG Respiratory: CTA B Gastrointestinal: abdomen soft, NT, ND, BS+ Musculoskeletal: no deformities, strength intact in all 4 Skin: moist, warm, no rashes Neurological: no tremor with outstretched hands, DTR normal in all 4  ASSESSMENT: 1. DM2, non-insulin-dependent, uncontrolled, without long term complications, but with hyperglycemia  2. HL  3. Obesity class 2 BMI Classification:  < 18.5 underweight   18.5-24.9 normal weight   25.0-29.9 overweight   30.0-34.9 class I obesity   35.0-39.9 class II obesity   ? 40.0 class III obesity   PLAN:  1. Patient with long-standing, uncontrolled diabetes, on oral antidiabetic regimen, with  significant improvement in her diabetes control since last visit.  We started Invokana, and she is feeling very well on it, without complaints.  Sugars are at or close to goal, with the exception of some lows, usually with exercise.  She started to improve her diet and is actually very interested in the plant-based diet that I suggested at last visit.  She started to read the suggested material and is planning to start the diet soon.  In this case, she will need less medication so for now, we discussed about reducing the Amaryl doses. - Otherwise, we will continue with metformin, Januvia, and Invokana.-   - I suggested to:  Patient Instructions  Please continue: - Metformin 1000 mg 2x a day with meals - Januvia 100 mg before b'fast - Invokana 100 mg before b'fast  Please decrease: - Glimepiride to 2 mg 2x a day before meals  Read the following books: Dr. Karl Luke - Prevent and Reverse Heart Disease Dr. Alden Benjamin - How Not to Die Christa See - The Engine 2 diet  Please return in 2-3 months with your sugar log.   Reviewed latest HbA1c, which was 9.3%, but I expect the next one to be much better - continue checking sugars at different times of the day - check 1-2x a day, rotating checks - advised for yearly eye exams >> she is UTD - Return to clinic in 3 mo with sugar log   2. HL -  reviewed together her most recent lipid panel >> LDL was too high, at 145, and therefore, she restarted a statin (Crestor 5).  She does have joint pain and we discussed about resuming exercise: Elastic bands, weight - We also discussed about the impact of the  plant-based diet on her lipids.  Our target total cholesterol is less than 150.  Given more references about improving cholesterol levels with diet.  2. Obesity class 2 - lost few lbs since last visit - please see above >> discussed about improving diet, which should also help with wt loss - continue Invokana which promotes wt  loss  Philemon Kingdom, MD PhD [facility] Endocrinology

## 2017-05-02 NOTE — Patient Instructions (Addendum)
Please continue: - Metformin 1000 mg 2x a day with meals - Januvia 100 mg before b'fast - Invokana 100 mg before b'fast  Please decrease: - Glimepiride to 2 mg 2x a day before meals  Read the following books: Dr. Karl Luke - Prevent and Reverse Heart Disease Dr. Alden Benjamin - How Not to Die Christa See - The Engine 2 diet  Please return in 2-3 months with your sugar log.

## 2017-05-12 MED FILL — OMEPRAZOLE DR 40 MG CAPSULE: 40 | 30 days supply | Qty: 60 | Fill #1

## 2017-05-12 MED FILL — GLIMEPIRIDE 4 MG TABLET: 4 | 30 days supply | Qty: 60 | Fill #1

## 2017-05-12 MED FILL — metFORMIN HCL 1000 MG TABS: 1000 | 30 days supply | Qty: 75 | Fill #0

## 2017-05-20 MED FILL — INVOKANA 100 MG TABLET: 100 | 30 days supply | Qty: 30 | Fill #3

## 2017-05-20 MED FILL — JANUVIA 100 MG TABLET: 100 | 30 days supply | Qty: 30 | Fill #3

## 2017-06-13 ENCOUNTER — Other Ambulatory Visit: Payer: Self-pay | Admitting: Family Medicine

## 2017-06-13 MED FILL — metFORMIN HCL 1000 MG TABS: 1000 | 30 days supply | Qty: 75 | Fill #1

## 2017-06-13 MED FILL — GLIMEPIRIDE 4 MG TABLET: 4 | 30 days supply | Qty: 60 | Fill #2

## 2017-06-13 MED FILL — OMEPRAZOLE DR 40 MG CAPSULE: 40 | 30 days supply | Qty: 60 | Fill #0

## 2017-06-14 HISTORY — PX: BREAST EXCISIONAL BIOPSY: SUR124

## 2017-06-22 ENCOUNTER — Other Ambulatory Visit: Payer: Self-pay | Admitting: Family Medicine

## 2017-06-22 MED FILL — JANUVIA 100 MG TABLET: 100 | 30 days supply | Qty: 30 | Fill #0

## 2017-06-22 MED FILL — INVOKANA 100 MG TABLET: 100 | 30 days supply | Qty: 30 | Fill #4

## 2017-06-30 ENCOUNTER — Encounter: Payer: Self-pay | Admitting: Family Medicine

## 2017-06-30 ENCOUNTER — Ambulatory Visit: Payer: BLUE CROSS/BLUE SHIELD | Admitting: Family Medicine

## 2017-06-30 DIAGNOSIS — I10 Essential (primary) hypertension: Secondary | ICD-10-CM | POA: Diagnosis not present

## 2017-06-30 DIAGNOSIS — E782 Mixed hyperlipidemia: Secondary | ICD-10-CM | POA: Diagnosis not present

## 2017-06-30 DIAGNOSIS — E1165 Type 2 diabetes mellitus with hyperglycemia: Secondary | ICD-10-CM | POA: Diagnosis not present

## 2017-06-30 DIAGNOSIS — R51 Headache: Secondary | ICD-10-CM | POA: Diagnosis not present

## 2017-06-30 DIAGNOSIS — R519 Headache, unspecified: Secondary | ICD-10-CM

## 2017-06-30 LAB — CBC
HEMATOCRIT: 43.5 % (ref 36.0–46.0)
Hemoglobin: 13.9 g/dL (ref 12.0–15.0)
MCHC: 32 g/dL (ref 30.0–36.0)
MCV: 81.2 fl (ref 78.0–100.0)
Platelets: 271 10*3/uL (ref 150.0–400.0)
RBC: 5.35 Mil/uL — ABNORMAL HIGH (ref 3.87–5.11)
RDW: 14.5 % (ref 11.5–15.5)
WBC: 6.1 10*3/uL (ref 4.0–10.5)

## 2017-06-30 LAB — COMPREHENSIVE METABOLIC PANEL
ALK PHOS: 75 U/L (ref 39–117)
ALT: 62 U/L — ABNORMAL HIGH (ref 0–35)
AST: 41 U/L — ABNORMAL HIGH (ref 0–37)
Albumin: 4.3 g/dL (ref 3.5–5.2)
BUN: 19 mg/dL (ref 6–23)
CHLORIDE: 101 meq/L (ref 96–112)
CO2: 29 meq/L (ref 19–32)
Calcium: 10 mg/dL (ref 8.4–10.5)
Creatinine, Ser: 0.95 mg/dL (ref 0.40–1.20)
GFR: 75.97 mL/min (ref >60.00)
GLUCOSE: 147 mg/dL — AB (ref 70–99)
POTASSIUM: 4 meq/L (ref 3.5–5.1)
SODIUM: 139 meq/L (ref 135–145)
TOTAL PROTEIN: 7.5 g/dL (ref 6.0–8.3)
Total Bilirubin: 0.4 mg/dL (ref 0.2–1.2)

## 2017-06-30 LAB — TSH: TSH: 1.33 u[IU]/mL (ref 0.35–4.50)

## 2017-06-30 LAB — LIPID PANEL
CHOL/HDL RATIO: 6
Cholesterol: 236 mg/dL — ABNORMAL HIGH (ref 0–200)
HDL: 39 mg/dL — ABNORMAL LOW (ref >39.00)
LDL CALC: 165 mg/dL — AB (ref 0–99)
NONHDL: 197.21
Triglycerides: 159 mg/dL — ABNORMAL HIGH (ref 0.0–149.0)
VLDL: 31.8 mg/dL (ref 0.0–40.0)

## 2017-06-30 LAB — HEMOGLOBIN A1C: Hgb A1c MFr Bld: 7.3 % — ABNORMAL HIGH (ref 4.6–6.5)

## 2017-06-30 NOTE — Assessment & Plan Note (Addendum)
hgba1c unacceptable, minimize simple carbs. Increase exercise as tolerated. Continue current meds.will recheck hgba1c and given a prescription for contour test strips. Am 113 sugars, after lunch 140s when eating. Goes over 200 when eats badly. Is now following with endocrinology. [place] and her sugar dropped to 46 after eating and walking. Has not been recurrent.

## 2017-06-30 NOTE — Assessment & Plan Note (Signed)
Well controlled, no changes to meds. Encouraged heart healthy diet such as the DASH diet and exercise as tolerated.  °

## 2017-06-30 NOTE — Patient Instructions (Signed)
Start Plain mucinex twice daily. Flonase nose spray Encouraged increased rest and hydration, add probiotics, zinc such as Coldeze or Xicam. Treat fevers as needed, Vitamin C 500 to 1000 and elderberry if allergic consider Zyrtec 10 mg daily Lidocaine gel to neck, aspercreme, icy hot and salon pas  Encouraged increased hydration and fiber in diet. Daily probiotics. If bowels not moving can use MOM 2 tbls po in 4 oz of warm prune juice by mouth every 2-3 days. If no results then repeat in 4 hours with  Dulcolax suppository pr, may repeat again in 4 more hours as needed. Seek care if symptoms worsen. Consider daily Miralax and/or Dulcolax if symptoms persist.   Mix miralax and benefiber together once to twice daily Carbohydrate Counting for Diabetes Mellitus, Adult Carbohydrate counting is a method for keeping track of how many carbohydrates you eat. Eating carbohydrates naturally increases the amount of sugar (glucose) in the blood. Counting how many carbohydrates you eat helps keep your blood glucose within normal limits, which helps you manage your diabetes (diabetes mellitus). It is important to know how many carbohydrates you can safely have in each meal. This is different for every person. A diet and nutrition specialist (registered dietitian) can help you make a meal plan and calculate how many carbohydrates you should have at each meal and snack. Carbohydrates are found in the following foods:  Grains, such as breads and cereals.  Dried beans and soy products.  Starchy vegetables, such as potatoes, peas, and corn.  Fruit and fruit juices.  Milk and yogurt.  Sweets and snack foods, such as cake, cookies, candy, chips, and soft drinks.  How do I count carbohydrates? There are two ways to count carbohydrates in food. You can use either of the methods or a combination of both. Reading "Nutrition Facts" on packaged food The "Nutrition Facts" list is included on the labels of almost all  packaged foods and beverages in the [place] It includes:  The serving size.  Information about nutrients in each serving, including the grams (g) of carbohydrate per serving.  To use the "Nutrition Facts":  Decide how many servings you will have.  Multiply the number of servings by the number of carbohydrates per serving.  The resulting number is the total amount of carbohydrates that you will be having.  Learning standard serving sizes of other foods When you eat foods containing carbohydrates that are not packaged or do not include "Nutrition Facts" on the label, you need to measure the servings in order to count the amount of carbohydrates:  Measure the foods that you will eat with a food scale or measuring cup, if needed.  Decide how many standard-size servings you will eat.  Multiply the number of servings by 15. Most carbohydrate-rich foods have about 15 g of carbohydrates per serving. ? For example, if you eat 8 oz (170 g) of strawberries, you will have eaten 2 servings and 30 g of carbohydrates (2 servings x 15 g = 30 g).  For foods that have more than one food mixed, such as soups and casseroles, you must count the carbohydrates in each food that is included.  The following list contains standard serving sizes of common carbohydrate-rich foods. Each of these servings has about 15 g of carbohydrates:   hamburger bun or  English muffin.   oz (15 mL) syrup.   oz (14 g) jelly.  1 slice of bread.  1 six-inch tortilla.  3 oz (85 g) cooked rice or pasta.  4 oz (113 g) cooked dried beans.  4 oz (113 g) starchy vegetable, such as peas, corn, or potatoes.  4 oz (113 g) hot cereal.  4 oz (113 g) mashed potatoes or  of a large baked potato.  4 oz (113 g) canned or frozen fruit.  4 oz (120 mL) fruit juice.  4-6 crackers.  6 chicken nuggets.  6 oz (170 g) unsweetened dry cereal.  6 oz (170 g) plain fat-free yogurt or yogurt sweetened with artificial  sweeteners.  8 oz (240 mL) milk.  8 oz (170 g) fresh fruit or one small piece of fruit.  24 oz (680 g) popped popcorn.  Example of carbohydrate counting Sample meal  3 oz (85 g) chicken breast.  6 oz (170 g) brown rice.  4 oz (113 g) corn.  8 oz (240 mL) milk.  8 oz (170 g) strawberries with sugar-free whipped topping. Carbohydrate calculation 1. Identify the foods that contain carbohydrates: ? Rice. ? Corn. ? Milk. ? Strawberries. 2. Calculate how many servings you have of each food: ? 2 servings rice. ? 1 serving corn. ? 1 serving milk. ? 1 serving strawberries. 3. Multiply each number of servings by 15 g: ? 2 servings rice x 15 g = 30 g. ? 1 serving corn x 15 g = 15 g. ? 1 serving milk x 15 g = 15 g. ? 1 serving strawberries x 15 g = 15 g. 4. Add together all of the amounts to find the total grams of carbohydrates eaten: ? 30 g + 15 g + 15 g + 15 g = 75 g of carbohydrates total. This information is not intended to replace advice given to you by your health care provider. Make sure you discuss any questions you have with your health care provider. Document Released: 05/31/2005 Document Revised: 12/19/2015 Document Reviewed: 11/12/2015 Elsevier Interactive Patient Education  [employer].

## 2017-06-30 NOTE — Progress Notes (Signed)
Subjective:  I acted as a Education administrator for [employer]. Yancey Flemings, [place]   Patient ID: [name], female    DOB: 1951-03-28, 67 y.o.   MRN: [id number]  Chief Complaint  Patient presents with  . Follow-up    HPI  Patient is in today for 3 month follow up visit and overall she is feeling well. No polyuria or polydipsia. Enjoyed her holidays but did have some stressors. Feels she is managing well. Denies CP/palp/SOB/HA/congestion/fevers/GI or GU c/o. Taking meds as prescribed  Patient Care Team: Mosie Lukes, MD as PCP - General (Family Medicine) Jola Schmidt, MD as Consulting Physician (Ophthalmology) Royston Sinner Colin Benton, MD as Consulting Physician (Obstetrics and Gynecology)   Past Medical History:  Diagnosis Date  . Acute bronchitis 10/31/2015  . Allergy    seasonal  . Arthritis    knee-left  . Cataract    right eye  . Depression    job/stress related  . Diabetes mellitus 52   type 2  . Diaphragmatic hernia without mention of obstruction or gangrene   . Endometriosis   . Esophageal reflux   . Esophageal stricture   . GERD (gastroesophageal reflux disease)   . Hyperlipidemia   . Hyperplastic colon polyp   . Hypertension 52  . Nonalcoholic fatty liver disease 04/28/2010   Qualifier: Diagnosis of  By: Arnoldo Morale MD, Balinda Quails   . Nonspecific abnormal results of liver function study   . Pharyngitis 02/18/2012  . Plantar fascial fibromatosis   . Preventative health care 01/22/2013  . Seasonal allergies 10/31/2015  . Sleep apnea    mouth piece per pt.  . Sore throat 04/03/2017  . Tubular adenoma of colon   . Unspecified sleep apnea    no c-pap    Past Surgical History:  Procedure Laterality Date  . ABDOMINAL HYSTERECTOMY  1982   partial  . APPENDECTOMY    . 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 BREAST CYST INCISION AND DRAINAGE     right breast  . CATARACT EXTRACTION     right eye  . HERNIA REPAIR     per pt, she is not aware of this surgery  . seba    . TUBAL  LIGATION      Family History  Problem Relation Age of Onset  . Breast cancer Mother   . Hypertension Mother   . Diabetes Mother        type 2  . Other Mother        esophagus surgery  . Heart disease Father   . Hyperlipidemia Father   . Hypertension Father   . Heart attack Father        MI at age 54  . Diabetes Brother        type 2  . Heart disease Maternal Grandmother   . Heart attack Maternal Grandfather   . Hypertension Maternal Grandfather   . Stroke Paternal Grandmother   . Hypertension Sister   . Diabetes Sister   . Hypertension Brother   . Alcohol abuse Maternal Aunt   . Colon cancer Neg Hx   . Stomach cancer Neg Hx   . Esophageal cancer Neg Hx     Social History   Socioeconomic History  . Marital status: Married    Spouse name: [name]  . Number of children: 2  . Years of education: Not on file  . Highest education level: Not on file  Social Needs  . Financial resource strain: Not on  file  . Food insecurity - worry: Not on file  . Food insecurity - inability: Not on file  . Transportation needs - medical: Not on file  . Transportation needs - non-medical: Not on file  Occupational History  . Occupation: Building surveyor: [employer]  Tobacco Use  . Smoking status: Never Smoker  . Smokeless tobacco: Never Used  Substance and Sexual Activity  . Alcohol use: No    Alcohol/week: 0.0 oz  . Drug use: No  . Sexual activity: Yes    Comment: lives with husband, no dietary restrictions just watching carbs, wears seat belt  Other Topics Concern  . Not on file  Social History Narrative  . Not on file    Outpatient Medications Prior to Visit  Medication Sig Dispense Refill  . ASPIR-LOW 81 MG EC tablet TAKE 1 TABLET BY MOUTH DAILY 30 tablet 3  . BAYER CONTOUR TEST test strip USE AS INSTRUCTED 100 each 1  . bisoprolol-hydrochlorothiazide (ZIAC) 5-6.25 MG tablet Take 1 tablet by mouth daily. 90 tablet 0  . bisoprolol-hydrochlorothiazide (ZIAC)  5-6.25 MG tablet TAKE 1 TABLET BY MOUTH DAILY. 90 tablet 0  . canagliflozin (INVOKANA) 100 MG TABS tablet Take 1 tablet (100 mg total) by mouth daily before breakfast. 30 tablet 2  . glimepiride (AMARYL) 4 MG tablet Take 0.5 tablets (2 mg total) 2 (two) times daily by mouth. 45 tablet 3  . JANUVIA 100 MG tablet TAKE 1 TABLET (100 MG TOTAL) BY MOUTH DAILY. 30 tablet 3  . KRILL OIL PO Take 1 tablet by mouth daily.     . metFORMIN (GLUCOPHAGE) 1000 MG tablet TAKE 1 TABLET BY MOUTH TWICE A DAY AND TAKE 1/2 TABLET AT NOON 75 tablet 1  . omeprazole (PRILOSEC) 40 MG capsule TAKE 1 CAPSULE (40 MG) BY MOUTH 2 TIMES A DAY. 60 capsule 1  . Probiotic Product (PROBIOTIC DAILY PO) Take 1 capsule by mouth daily.    . rosuvastatin (CRESTOR) 5 MG tablet Take 1 tablet (5 mg total) by mouth every other day. 15 tablet 2   No facility-administered medications prior to visit.     Allergies  Allergen Reactions  . Pravastatin Other (See Comments)    Myalgia, fatigue, memory loss  . Amoxicillin Itching    REACTION: rash  . Atorvastatin Other (See Comments)    Myalgia, fatigue, memory loss  . Codeine Nausea And Vomiting    Review of Systems  Constitutional: Negative for fever and malaise/fatigue.  HENT: Negative for congestion.   Eyes: Negative for blurred vision.  Respiratory: Negative for shortness of breath.   Cardiovascular: Negative for chest pain, palpitations and leg swelling.  Gastrointestinal: Negative for abdominal pain, blood in stool and nausea.  Genitourinary: Negative for dysuria and frequency.  Musculoskeletal: Negative for falls.  Skin: Negative for rash.  Neurological: Positive for headaches. Negative for dizziness and loss of consciousness.  Endo/Heme/Allergies: Negative for environmental allergies.  Psychiatric/Behavioral: Negative for depression. The patient is not nervous/anxious.        Objective:    Physical Exam  Constitutional: She is oriented to person, place, and time. She  appears well-developed and well-nourished. No distress.  HENT:  Head: Normocephalic and atraumatic.  Nose: Nose normal.  Eyes: Right eye exhibits no discharge. Left eye exhibits no discharge.  Neck: Normal range of motion. Neck supple.  Cardiovascular: Normal rate and regular rhythm.  No murmur heard. Pulmonary/Chest: Effort normal and breath sounds normal.  Abdominal: Soft. Bowel sounds are  normal. There is no tenderness.  Musculoskeletal: She exhibits no edema.  Neurological: She is alert and oriented to person, place, and time.  Skin: Skin is warm and dry.  Psychiatric: She has a normal mood and affect.  Nursing note and vitals reviewed.   BP 120/72 (BP Location: Left Arm, Patient Position: Sitting, Cuff Size: Normal)   Pulse 70   Temp 98.6 F (37 C) (Oral)   Resp 16   Ht 5' 2.6" (1.59 m)   Wt 192 lb 6.4 oz (87.3 kg)   SpO2 100%   BMI 34.52 kg/m  Wt Readings from Last 3 Encounters:  06/30/17 192 lb 6.4 oz (87.3 kg)  05/02/17 194 lb 9.6 oz (88.3 kg)  04/01/17 194 lb 9.6 oz (88.3 kg)   BP Readings from Last 3 Encounters:  06/30/17 120/72  05/02/17 126/78  04/01/17 118/72     Immunization History  Administered Date(s) Administered  . Influenza Split 03/22/2011, 02/17/2012  . Influenza Whole 03/22/2007, 03/19/2008, 04/28/2010  . Influenza,inj,Quad PF,6+ Mos 02/26/2013, 03/04/2014, 04/01/2017  . Pneumococcal Conjugate-13 05/28/2013  . Pneumococcal Polysaccharide-23 09/04/2015  . Td 10/12/2001  . Tdap 01/03/2012    Health Maintenance  Topic Date Due  . HIV Screening  09/08/1967  . OPHTHALMOLOGY EXAM  08/10/2016  . URINE MICROALBUMIN  12/03/2016  . FOOT EXAM  07/09/2017  . HEMOGLOBIN A1C  12/28/2017  . MAMMOGRAM  02/03/2019  . PAP SMEAR  07/10/2019  . COLONOSCOPY  11/17/2020  . TETANUS/TDAP  01/02/2022  . INFLUENZA VACCINE  Completed  . Hepatitis C Screening  Completed    Lab Results  Component Value Date   WBC 6.1 06/30/2017   HGB 13.9 06/30/2017   HCT  43.5 06/30/2017   PLT 271.0 06/30/2017   GLUCOSE 147 (H) 06/30/2017   CHOL 236 (H) 06/30/2017   TRIG 159.0 (H) 06/30/2017   HDL 39.00 (L) 06/30/2017   LDLCALC 165 (H) 06/30/2017   ALT 62 (H) 06/30/2017   AST 41 (H) 06/30/2017   NA 139 06/30/2017   K 4.0 06/30/2017   CL 101 06/30/2017   CREATININE 0.95 06/30/2017   BUN 19 06/30/2017   CO2 29 06/30/2017   TSH 1.33 06/30/2017   HGBA1C 7.3 (H) 06/30/2017   MICROALBUR <0.7 12/04/2015    Lab Results  Component Value Date   TSH 1.33 06/30/2017   Lab Results  Component Value Date   WBC 6.1 06/30/2017   HGB 13.9 06/30/2017   HCT 43.5 06/30/2017   MCV 81.2 06/30/2017   PLT 271.0 06/30/2017   Lab Results  Component Value Date   NA 139 06/30/2017   K 4.0 06/30/2017   CO2 29 06/30/2017   GLUCOSE 147 (H) 06/30/2017   BUN 19 06/30/2017   CREATININE 0.95 06/30/2017   BILITOT 0.4 06/30/2017   ALKPHOS 75 06/30/2017   AST 41 (H) 06/30/2017   ALT 62 (H) 06/30/2017   PROT 7.5 06/30/2017   ALBUMIN 4.3 06/30/2017   CALCIUM 10.0 06/30/2017   ANIONGAP 16 (H) 12/19/2013   GFR 75.97 06/30/2017   Lab Results  Component Value Date   CHOL 236 (H) 06/30/2017   Lab Results  Component Value Date   HDL 39.00 (L) 06/30/2017   Lab Results  Component Value Date   LDLCALC 165 (H) 06/30/2017   Lab Results  Component Value Date   TRIG 159.0 (H) 06/30/2017   Lab Results  Component Value Date   CHOLHDL 6 06/30/2017   Lab Results  Component Value Date  HGBA1C 7.3 (H) 06/30/2017         Assessment & Plan:   Problem List Items Addressed This Visit    Type 2 diabetes mellitus with hyperglycemia, without long-term current use of insulin (HCC)    hgba1c unacceptable, minimize simple carbs. Increase exercise as tolerated. Continue current meds.will recheck hgba1c and given a prescription for contour test strips. Am 113 sugars, after lunch 140s when eating. Goes over 200 when eats badly. Is now following with endocrinology. She [place]  [place] and her sugar dropped to 46 after eating and walking. Has not been recurrent.       Relevant Orders   Hemoglobin A1c (Completed)   Hyperlipidemia, mixed    Tolerating statin, encouraged heart healthy diet, avoid trans fats, minimize simple carbs and saturated fats. Increase exercise as tolerated.       Relevant Orders   Lipid panel (Completed)   Essential hypertension    Well controlled, no changes to meds. Encouraged heart healthy diet such as the DASH diet and exercise as tolerated.       Relevant Orders   CBC (Completed)   Comprehensive metabolic panel (Completed)   TSH (Completed)   Headache    Encouraged increased hydration, 64 ounces of clear fluids daily. Minimize alcohol and caffeine. Eat small frequent meals with lean proteins and complex carbs. Avoid high and low blood sugars. Get adequate sleep, 7-8 hours a night. Needs exercise daily preferably in the morning.          I am having [name] maintain her BAYER CONTOUR TEST, Probiotic Product (PROBIOTIC DAILY PO), KRILL OIL PO, ASPIR-LOW, bisoprolol-hydrochlorothiazide, canagliflozin, metFORMIN, rosuvastatin, bisoprolol-hydrochlorothiazide, glimepiride, omeprazole, and JANUVIA.  No orders of the defined types were placed in this encounter.   CMA served as Education administrator during this visit. History, Physical and Plan performed by medical provider. Documentation and orders reviewed and attested to.  [name], MD

## 2017-06-30 NOTE — Assessment & Plan Note (Signed)
Tolerating statin, encouraged heart healthy diet, avoid trans fats, minimize simple carbs and saturated fats. Increase exercise as tolerated 

## 2017-06-30 NOTE — Assessment & Plan Note (Signed)
Encouraged increased hydration, 64 ounces of clear fluids daily. Minimize alcohol and caffeine. Eat small frequent meals with lean proteins and complex carbs. Avoid high and low blood sugars. Get adequate sleep, 7-8 hours a night. Needs exercise daily preferably in the morning.  

## 2017-07-13 ENCOUNTER — Other Ambulatory Visit: Payer: Self-pay | Admitting: Family Medicine

## 2017-07-13 MED FILL — ROSUVASTATIN CALCIUM 5 MG T: 5 | 30 days supply | Qty: 15 | Fill #1

## 2017-07-13 MED FILL — BISOPROLOL-HCTZ 5-6.25 MG T: 5-6.25 | 90 days supply | Qty: 90 | Fill #0

## 2017-07-18 ENCOUNTER — Emergency Department (HOSPITAL_BASED_OUTPATIENT_CLINIC_OR_DEPARTMENT_OTHER)
Admission: EM | Admit: 2017-07-18 | Discharge: 2017-07-18 | Disposition: A | Discharge status: Home / Self Care | Payer: BLUE CROSS/BLUE SHIELD | Attending: Emergency Medicine | Admitting: Emergency Medicine

## 2017-07-18 ENCOUNTER — Encounter (HOSPITAL_BASED_OUTPATIENT_CLINIC_OR_DEPARTMENT_OTHER): Payer: Self-pay | Admitting: Emergency Medicine

## 2017-07-18 ENCOUNTER — Ambulatory Visit: Payer: Self-pay | Admitting: *Deleted

## 2017-07-18 ENCOUNTER — Other Ambulatory Visit: Payer: Self-pay

## 2017-07-18 DIAGNOSIS — R110 Nausea: Secondary | ICD-10-CM | POA: Insufficient documentation

## 2017-07-18 DIAGNOSIS — Z79899 Other long term (current) drug therapy: Secondary | ICD-10-CM | POA: Insufficient documentation

## 2017-07-18 DIAGNOSIS — R358 Other polyuria: Secondary | ICD-10-CM | POA: Insufficient documentation

## 2017-07-18 DIAGNOSIS — R109 Unspecified abdominal pain: Secondary | ICD-10-CM | POA: Diagnosis present

## 2017-07-18 DIAGNOSIS — R1084 Generalized abdominal pain: Secondary | ICD-10-CM | POA: Diagnosis not present

## 2017-07-18 DIAGNOSIS — Z7982 Long term (current) use of aspirin: Secondary | ICD-10-CM | POA: Insufficient documentation

## 2017-07-18 DIAGNOSIS — I10 Essential (primary) hypertension: Secondary | ICD-10-CM | POA: Diagnosis not present

## 2017-07-18 DIAGNOSIS — Z7984 Long term (current) use of oral hypoglycemic drugs: Secondary | ICD-10-CM | POA: Insufficient documentation

## 2017-07-18 DIAGNOSIS — E119 Type 2 diabetes mellitus without complications: Secondary | ICD-10-CM | POA: Insufficient documentation

## 2017-07-18 LAB — URINALYSIS, ROUTINE W REFLEX MICROSCOPIC
BILIRUBIN URINE: NEGATIVE
Glucose, UA: >=500 mg/dL — AB
Hgb urine dipstick: NEGATIVE
KETONES UR: NEGATIVE mg/dL
LEUKOCYTES UA: NEGATIVE
NITRITE: NEGATIVE
PROTEIN: NEGATIVE mg/dL
Specific Gravity, Urine: 1.02 (ref 1.005–1.030)
pH: 5.5 (ref 5.0–8.0)

## 2017-07-18 LAB — URINALYSIS, MICROSCOPIC (REFLEX)

## 2017-07-18 NOTE — Discharge Instructions (Signed)
Follow-up with your primary care doctor and call your GI specialist to schedule a focused exam for your abdominal pain.

## 2017-07-18 NOTE — Telephone Encounter (Signed)
Pt called complaining of abdominal pain that goes all around stomach and back; this started about 2-3 weeks ago; awakens pt around 0300 every day; this am she has experienced nausea; per nurse triage recommendation made for pt to go to ED; she verbalizes understanding and is enroute; she also says that she has a rash on her face which is from medication that she is taking and the instructions state for her to go to ED if a rash develops; will route to [facility] for notification of this encounter.  Reason for Disposition . [1] SEVERE pain AND [2] age > 70  Answer Assessment - Initial Assessment Questions 1. LOCATION: "Where does it hurt?"      Upper and lower abdomen 2. RADIATION: "Does the pain shoot anywhere else?" (e.g., chest, back)     back 3. ONSET: "When did the pain begin?" (e.g., minutes, hours or days ago)      days 4. SUDDEN: "Gradual or sudden onset?"     Sudden and gradual 5. PATTERN "Does the pain come and go, or is it constant?"    - If constant: "Is it getting better, staying the same, or worsening?"      (Note: Constant means the pain never goes away completely; most serious pain is constant and it progresses)     - If intermittent: "How long does it last?" "Do you have pain now?"     (Note: Intermittent means the pain goes away completely between bouts)     intermittent 6. SEVERITY: "How bad is the pain?"  (e.g., Scale 1-10; mild, moderate, or severe)   - MILD (1-3): doesn't interfere with normal activities, abdomen soft and not tender to touch    - MODERATE (4-7): interferes with normal activities or awakens from sleep, tender to touch    - SEVERE (8-10): excruciating pain, doubled over, unable to do any normal activities      severe 7. RECURRENT SYMPTOM: "Have you ever had this type of abdominal pain before?" If so, ask: "When was the last time?" and "What happened that time?"    Yes has daily 8. CAUSE: "What do you think is causing the abdominal pain?"      unsure 9. RELIEVING/AGGRAVATING FACTORS: "What makes it better or worse?" (e.g., movement, antacids, bowel movement)     no 10. OTHER SYMPTOMS: "Has there been any vomiting, diarrhea, constipation, or urine problems?"       nausea 11. PREGNANCY: "Is there any chance you are pregnant?" "When was your last menstrual period?"       no  Protocols used: ABDOMINAL PAIN - [facility]

## 2017-07-18 NOTE — Telephone Encounter (Signed)
Thanks, sounds like gallbladder

## 2017-07-18 NOTE — Telephone Encounter (Signed)
Pt headed to ED.

## 2017-07-18 NOTE — ED Provider Notes (Signed)
[place] EMERGENCY DEPARTMENT Provider Note   CSN: [id number] Arrival date & time: 07/18/17  0865     History   Chief Complaint Chief Complaint  Patient presents with  . Abdominal Pain    HPI [name] is a 67 y.o. female.  Patient is a 67 year old female presenting with 3 weeks of diffuse abdominal pain.  PMH significant for chronic abdominal pain with extensive unremarkable workup, NIDT2DM, HTN, HLD, OSA, GERD, NAFLD, obesity, endometriosis.  Patient use abdominal pain beginning under ribcages bilaterally and radiating down to lower abdomen and back bilaterally.  Patient states she has had this pain in the past with extensive workup including an unremarkable CT of the abdomen and pelvis in March 2018 followed by an unremarkable abdominal ultrasound in April 2018.  Patient states she had some nausea earlier today prompting her visit.  Patient has been able to maintain a regular diet and has been apparent to her medications.  Patient denies fevers or chills, shortness of breath, vomiting, diarrhea, constipation, melena or hematochezia, explained weight loss, vaginal discharge or bleeding.  Patient does endorse some polyuria more recently without dysuria or hematuria.  She is a non-smoker and denies EtOH or illicit drug use.      Past Medical History:  Diagnosis Date  . Acute bronchitis 10/31/2015  . Allergy    seasonal  . Arthritis    knee-left  . Cataract    right eye  . Depression    job/stress related  . Diabetes mellitus 52   type 2  . Diaphragmatic hernia without mention of obstruction or gangrene   . Endometriosis   . Esophageal reflux   . Esophageal stricture   . GERD (gastroesophageal reflux disease)   . Hyperlipidemia   . Hyperplastic colon polyp   . Hypertension 52  . Nonalcoholic fatty liver disease 04/28/2010   Qualifier: Diagnosis of  By: Arnoldo Morale MD, Balinda Quails   . Nonspecific abnormal results of liver function study   . Pharyngitis  02/18/2012  . Plantar fascial fibromatosis   . Preventative health care 01/22/2013  . Seasonal allergies 10/31/2015  . Sleep apnea    mouth piece per pt.  . Sore throat 04/03/2017  . Tubular adenoma of colon   . Unspecified sleep apnea    no c-pap    Patient Active Problem List   Diagnosis Date Noted  . Headache 06/30/2017  . Obesity (BMI 30-39.9) 05/02/2017  . Seasonal allergies 10/31/2015  . Hx of adenomatous colonic polyps 04/30/2014  . Cervical cancer screening 01/22/2013  . Preventative health care 01/22/2013  . Chest pain 01/06/2012  . Endometriosis   . Chronic cough 08/31/2011  . Nonalcoholic fatty liver disease 04/28/2010  . OSA (obstructive sleep apnea) 03/26/2009  . RT BUNDLE BRANCH BLOCK&LT ANT FASCICULAR BLOCK 12/20/2008  . Thoracic back pain 10/10/2008  . OSTEOARTHRITIS, MODERATE 07/05/2008  . Abdominal pain 07/05/2008  . Aneurysm, thoracoabdominal ([place]) 03/22/2007  . Type 2 diabetes mellitus with hyperglycemia, without long-term current use of insulin ([place]) 10/28/2006  . Hyperlipidemia, mixed 10/28/2006  . Depression with anxiety 10/28/2006  . Essential hypertension 10/28/2006  . ESOPHAGEAL STRICTURE 05/04/2004  . HIATAL HERNIA 05/04/2004    Past Surgical History:  Procedure Laterality Date  . ABDOMINAL HYSTERECTOMY  1982   partial  . APPENDECTOMY    . 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 BREAST CYST INCISION AND DRAINAGE     right breast  . CATARACT EXTRACTION     right  eye  . HERNIA REPAIR     per pt, she is not aware of this surgery  . seba    . TUBAL LIGATION      OB History    No data available       Home Medications    Prior to Admission medications   Medication Sig Start Date End Date Taking? Authorizing Provider  ASPIR-LOW 81 MG EC tablet TAKE 1 TABLET BY MOUTH DAILY 01/16/15   Mosie Lukes, MD  BAYER CONTOUR TEST test strip USE AS INSTRUCTED 03/06/13   Mosie Lukes, MD  bisoprolol-hydrochlorothiazide [facility]) 5-6.25 MG tablet Take  1 tablet by mouth daily. 06/25/16   Mosie Lukes, MD  bisoprolol-hydrochlorothiazide (ZIAC) 5-6.25 MG tablet TAKE 1 TABLET BY MOUTH DAILY. 07/13/17   Mosie Lukes, MD  canagliflozin (INVOKANA) 100 MG TABS tablet Take 1 tablet (100 mg total) by mouth daily before breakfast. 03/08/17   Philemon Kingdom, MD  glimepiride (AMARYL) 4 MG tablet Take 0.5 tablets (2 mg total) 2 (two) times daily by mouth. 05/02/17   Philemon Kingdom, MD  JANUVIA 100 MG tablet TAKE 1 TABLET (100 MG TOTAL) BY MOUTH DAILY. 06/22/17   Mosie Lukes, MD  KRILL OIL PO Take 1 tablet by mouth daily.     [provider]  metFORMIN (GLUCOPHAGE) 1000 MG tablet TAKE 1 TABLET BY MOUTH TWICE A DAY AND TAKE 1/2 TABLET AT NOON 04/01/17   Mosie Lukes, MD  omeprazole (PRILOSEC) 40 MG capsule TAKE 1 CAPSULE (40 MG) BY MOUTH 2 TIMES A DAY. 06/13/17   Mosie Lukes, MD  Probiotic Product (PROBIOTIC DAILY PO) Take 1 capsule by mouth daily.    [provider]  rosuvastatin (CRESTOR) 5 MG tablet Take 1 tablet (5 mg total) by mouth every other day. 04/01/17   Mosie Lukes, MD    Family History Family History  Problem Relation Age of Onset  . Breast cancer Mother   . Hypertension Mother   . Diabetes Mother        type 2  . Other Mother        esophagus surgery  . Heart disease Father   . Hyperlipidemia Father   . Hypertension Father   . Heart attack Father        MI at age 35  . Diabetes Brother        type 2  . Heart disease Maternal Grandmother   . Heart attack Maternal Grandfather   . Hypertension Maternal Grandfather   . Stroke Paternal Grandmother   . Hypertension Sister   . Diabetes Sister   . Hypertension Brother   . Alcohol abuse Maternal Aunt   . Colon cancer Neg Hx   . Stomach cancer Neg Hx   . Esophageal cancer Neg Hx     Social History Social History   Tobacco Use  . Smoking status: Never Smoker  . Smokeless tobacco: Never Used  Substance Use Topics  . Alcohol use: No     Alcohol/week: 0.0 oz  . Drug use: No     Allergies   Pravastatin; Amoxicillin; Atorvastatin; and Codeine   Review of Systems Review of Systems  Constitutional: Negative for chills, fatigue and fever.  HENT: Negative for congestion and sore throat.   Eyes: Negative for photophobia and visual disturbance.  Respiratory: Negative for cough and shortness of breath.   Cardiovascular: Positive for chest pain. Negative for palpitations and leg swelling.  Gastrointestinal: Positive for abdominal pain  and nausea. Negative for diarrhea and vomiting.  Genitourinary: Positive for frequency. Negative for dysuria, flank pain, urgency, vaginal bleeding and vaginal discharge.  Musculoskeletal: Negative for arthralgias and back pain.  Skin: Negative for pallor and rash.  Neurological: Negative for dizziness and headaches.  Psychiatric/Behavioral: Negative for confusion. The patient is nervous/anxious.      Physical Exam Updated Vital Signs BP 135/82 (BP Location: Left Arm)   Pulse 76   Temp 98.5 F (36.9 C) (Oral)   Resp 14   Ht 5' 2.5" (1.588 m)   Wt 87.1 kg (192 lb)   SpO2 99%   BMI 34.56 kg/m   Physical Exam  Constitutional: She is oriented to person, place, and time. She appears well-developed and well-nourished.  Non-toxic appearance. She does not appear ill. No distress.  HENT:  Head: Normocephalic and atraumatic.  Mouth/Throat: Oropharynx is clear and moist.  Eyes: EOM are normal. Pupils are equal, round, and reactive to light.  Cardiovascular: Normal rate, regular rhythm, normal heart sounds and intact distal pulses. Exam reveals no gallop and no friction rub.  No murmur heard. Pulmonary/Chest: Effort normal and breath sounds normal. She has no wheezes. She has no rhonchi. She has no rales.  Abdominal: Soft. Normal appearance and bowel sounds are normal. She exhibits no distension, no abdominal bruit, no ascites and no mass. There is no hepatosplenomegaly. There is no tenderness.  There is no rebound, no guarding and no CVA tenderness.  Neurological: She is alert and oriented to person, place, and time.  Skin: Skin is warm and dry. Capillary refill takes less than 2 seconds. No rash noted.  Psychiatric: She has a normal mood and affect. Her behavior is normal.     ED Treatments / Results  Labs (all labs ordered are listed, but only abnormal results are displayed) Labs Reviewed  URINALYSIS, ROUTINE W REFLEX MICROSCOPIC - Abnormal; Notable for the following components:      Result Value   Glucose, UA >=500 (*)    All other components within normal limits  URINALYSIS, MICROSCOPIC (REFLEX) - Abnormal; Notable for the following components:   Bacteria, UA FEW (*)    Squamous Epithelial / LPF 6-30 (*)    All other components within normal limits  URINE CULTURE    EKG  EKG Interpretation None       Radiology No results found.  Procedures Procedures (including critical care time)  Medications Ordered in ED Medications - No data to display   Initial Impression / Assessment and Plan / ED Course  I have reviewed the triage vital signs and the nursing notes.  Pertinent labs & imaging results that were available during my care of the patient were reviewed by me and considered in my medical decision making (see chart for details).    Patient is a 67 year old female presenting with 3 weeks of diffuse abdominal pain.  PMH significant for chronic abdominal pain with extensive unremarkable workup, NIDT2DM, HTN, HLD, OSA, GERD, NAFLD, obesity, endometriosis.  Patient endorsing diffuse abdominal pain and new onset nausea without vomiting.  Vital signs stable.  Patient well-appearing on exam without tenderness even with deep palpation.  No masses evident.  Patient denies nausea at present and endorses appetite.  Patient endorsing remote history of polyuria.  UA and urine culture obtained.  Reviewed most recent imaging including unremarkable abdominal and pelvic CT on  08/2016 along with unremarkable abdominal ultrasound on 09/2016.  Appears that patient is undergone extensive workup for this same pain.  Offered patient her extensive workup including blood work and imaging.  Patient requesting to follow-up with PCP for focus visit concerning her abdominal pain.  Patient also instructed to contact her GI specialist.  Final Clinical Impressions(s) / ED Diagnoses   Final diagnoses:  Generalized abdominal pain    ED Discharge Orders    None       Blennerhassett Bing, DO 07/19/17 [place], [place], DO 07/19/17 1916    Jola Schmidt, MD 07/19/17 [phone]

## 2017-07-18 NOTE — ED Triage Notes (Signed)
abd pain   This episode x 3 weeks  But hx of same

## 2017-07-19 LAB — URINE CULTURE: CULTURE: NO GROWTH

## 2017-07-21 ENCOUNTER — Ambulatory Visit: Payer: BLUE CROSS/BLUE SHIELD | Admitting: Gastroenterology

## 2017-07-21 ENCOUNTER — Encounter: Payer: Self-pay | Admitting: Gastroenterology

## 2017-07-21 VITALS — BP 108/62 | HR 72 | Ht 62.5 in | Wt 195.2 lb

## 2017-07-21 DIAGNOSIS — K5904 Chronic idiopathic constipation: Secondary | ICD-10-CM

## 2017-07-21 DIAGNOSIS — R1084 Generalized abdominal pain: Secondary | ICD-10-CM | POA: Diagnosis not present

## 2017-07-21 DIAGNOSIS — K219 Gastro-esophageal reflux disease without esophagitis: Secondary | ICD-10-CM | POA: Diagnosis not present

## 2017-07-21 MED ORDER — DICYCLOMINE HCL 10 MG PO CAPS: 10.0000 mg | ORAL_CAPSULE | Freq: Three times a day (TID) | ORAL | 11 refills | Status: DC

## 2017-07-21 MED FILL — DICYCLOMINE 10 MG CAPSULE: 10 | 30 days supply | Qty: 90 | Fill #0

## 2017-07-21 NOTE — Progress Notes (Signed)
    History of Present Illness: This is a 67 year old female epigastric pain and constipation.  She relates intermittent episodes of epigastric pain that generalizes across her abdomen and to her back.  They last for several hours at a time.  They generally occur in the middle of the night around 3 AM.  She relates that if she sits up and moves around the symptoms improved.  Symptoms are not associated with meals or bowel movements.  She has had problems with low back pain and numbness radiating down her left thigh.  Recent blood work unremarkable except for minimally elevated transaminases which is consistent with her history of hepatic steatosis.  She has constipation that has improved over the past few weeks with increasing her fiber and water intake.  She feels bloated frequently during the day and occasionally will have bowel movements following meals.  Reflux symptoms are well controlled on daily omeprazole.  Denies weight loss, diarrhea, change in stool caliber, melena, hematochezia, nausea, vomiting, dysphagia, chest pain.   Abd [place] 09/2016 Diffusely echogenic liver consistent with fatty change. No focal lesion. Normal direction portal vein flow.  Abd/pelvic CT 08/2016 Unremarkable  Colonoscopy 11/2015 - Five 3 to 4 mm polyps at the recto-sigmoid colon, in the sigmoid colon and in the transverse colon, removed with a cold biopsy forceps. Resected and retrieved.  EGD 05/2014 1. Stricture in the distal esophagus; dilated using savary dilators over guidewire 2. Gastritis in the gastric body; multiple biopsies performed 3. The EGD otherwise appeared normal  Current Medications, Allergies, Past Medical History, Past Surgical History, Family History and Social History were reviewed in [employer] record.  Physical Exam: General: Well developed, well nourished, no acute distress Head: Normocephalic and atraumatic Eyes:  sclerae anicteric, EOMI Ears: Normal auditory  acuity Mouth: No deformity or lesions Lungs: Clear throughout to auscultation Heart: Regular rate and rhythm; no murmurs, rubs or bruits Abdomen: Soft, mild epigastric tenderness and non distended. No masses, hepatosplenomegaly or hernias noted. Normal Bowel sounds Rectal: not done Musculoskeletal: Symmetrical with no gross deformities  Pulses:  Normal pulses noted Extremities: No clubbing, cyanosis, edema or deformities noted Neurological: Alert oriented x 4, grossly nonfocal Psychological:  Alert and cooperative. Normal mood and affect  Assessment and Recommendations:  1.  Epigastric pain that generalizes across abdomen and to her back.  Symptoms frequently associated with abdominal bloating.  Consider pain referred from her back.  She is advised to follow-up with her PCP regarding her back pain and left thigh numbness.  Continue high-fiber diet with appropriate daily water intake.  Trial of MiraLAX once or twice daily and dicyclomine 10 mg before meals 3 times daily. REV in 6 weeks.  2. GERD.  Continue standard antireflux measures and omeprazole 40 mg p.o. every morning.  3. Constipation.  See #1.  4.  Hepatic steatosis with minimally elevated transaminases.  Long-term fat modified, carb modified weight loss program supervised by her PCP.

## 2017-07-21 NOTE — Patient Instructions (Signed)
We have sent the following medications to your pharmacy for you to pick up at your convenience:dicyclomine.   Start Miralax mixing 17 grams in 8 oz of water 1-2 x daily.   Follow up with your primary care physician for your back pain.  Thank you for choosing me and [facility].  Pricilla Riffle. Dagoberto Ligas., MD., Marval Regal

## 2017-07-22 MED FILL — JANUVIA 100 MG TABLET: 100 | 30 days supply | Qty: 30 | Fill #1

## 2017-07-22 MED FILL — INVOKANA 100 MG TABLET: 100 | 30 days supply | Qty: 30 | Fill #5

## 2017-08-01 MED FILL — metFORMIN HCL 1000 MG TABS: 1000 | 30 days supply | Qty: 75 | Fill #2

## 2017-08-01 MED FILL — OMEPRAZOLE DR 40 MG CAPSULE: 40 | 30 days supply | Qty: 60 | Fill #1

## 2017-08-02 ENCOUNTER — Ambulatory Visit: Payer: BLUE CROSS/BLUE SHIELD | Admitting: Internal Medicine

## 2017-08-19 ENCOUNTER — Other Ambulatory Visit: Payer: Self-pay | Admitting: Internal Medicine

## 2017-08-19 MED FILL — INVOKANA 100 MG TABLET: 100 | 30 days supply | Qty: 30 | Fill #0

## 2017-08-19 MED FILL — JANUVIA 100 MG TABLET: 100 | 30 days supply | Qty: 30 | Fill #2

## 2017-08-24 ENCOUNTER — Ambulatory Visit (INDEPENDENT_AMBULATORY_CARE_PROVIDER_SITE_OTHER): Payer: Medicare Other | Admitting: Internal Medicine

## 2017-08-24 ENCOUNTER — Encounter: Payer: Self-pay | Admitting: Internal Medicine

## 2017-08-24 VITALS — BP 132/76 | HR 66 | Ht 63.5 in | Wt 197.4 lb

## 2017-08-24 DIAGNOSIS — E1165 Type 2 diabetes mellitus with hyperglycemia: Secondary | ICD-10-CM | POA: Diagnosis not present

## 2017-08-24 DIAGNOSIS — E782 Mixed hyperlipidemia: Secondary | ICD-10-CM

## 2017-08-24 DIAGNOSIS — E669 Obesity, unspecified: Secondary | ICD-10-CM | POA: Diagnosis not present

## 2017-08-24 MED ORDER — FLUVASTATIN SODIUM ER 80 MG PO TB24: 80.0000 mg | ORAL_TABLET | Freq: Every day | ORAL | 11 refills | Status: DC

## 2017-08-24 MED FILL — FLUVASTATIN ER 80 MG TABLET: 80 | 30 days supply | Qty: 30 | Fill #0

## 2017-08-24 NOTE — Patient Instructions (Addendum)
Please continue: - Metformin 1000 mg 2x a day with meals - Januvia 100 mg before b'fast - Invokana 100 mg before b'fast  Please stop: - Glimepiride   Please try Fluvastatin XL 80 mg.  Please return in 3 months with your sugar log.

## 2017-08-24 NOTE — Progress Notes (Signed)
Patient ID: [name], female   DOB: [birth year], 67 y.o.   MRN: [id number]   HPI: [name] is a 67 y.o.-year-old female, returning for f/u for DM2, dx in 2001, non-insulin-dependent, uncontrolled, without long term complications. Last visit 4 months ago.  She has AP >> went to the ED 07/2017 >> no culprit found. It resolved since. She had initially diarrhea and vomiting before this episode (? Viral gastroenteritis).  Last hemoglobin A1c was: Lab Results  Component Value Date   HGBA1C 7.3 (H) 06/30/2017   HGBA1C 9.3 (H) 02/16/2017   HGBA1C 8.7 (H) 11/15/2016   Pt is on a regimen of: - Metformin 1000 mg 2x a day with meals - Januvia 100 mg before b'fast - Invokana 100 mg before b'fast -started 02/2017 - Glimepiride 2 mg 2x a day before meals  Pt checks her sugars 2-3 times a day: - am: 219-250 >> 92-130, 140 >> 108-113 - 2h after b'fast: n/c >> 98-155, 200 >> 208 x1 - before lunch: low 200s >> 47 x1 (exercise), 80-145, 202 >> 39 (late lunch), 61, 79 - 2h after lunch: n/c >> 104-160 >> n/c  - before dinner: 200-225 >> 75-152, 184 >> 155 (after a snack) - 2h after dinner: n/c >> 73 (exercise), 124-163 >> low 100s - bedtime: n/c - nighttime: n/c Lowest sugar was 187 >> 47 >> 39; she has hypoglycemia awareness around 70s Highest sugar was 250 >> 202 >> 208  Glucometer: Scientist, research (physical sciences)  Pt's meals are: - Breakfast: scrambled eggs + bacon or oatmeal - Lunch: sandwich + meat - Dinner: veggies, chicken, creamed potatoed - Snacks: 1 reg. soda a day, PB crackers, cookie  She is walking 3 times a week for exercise. Also, now [facility]  - no CKD, last BUN/creatinine:  Lab Results  Component Value Date   BUN 19 06/30/2017   BUN 13 02/16/2017   CREATININE 0.95 06/30/2017   CREATININE 0.85 02/16/2017   -+ HL; last set of lipids: Lab Results  Component Value Date   CHOL 236 (H) 06/30/2017   HDL 39.00 (L) 06/30/2017   LDLCALC 165 (H) 06/30/2017   TRIG 159.0 (H)  06/30/2017   CHOLHDL 6 06/30/2017  Was on a statin >> had myalgia, fatigue, memory loss. Tried Crestor 5 >> mm aches. - last eye exam was in 06/2016: No DR. Had cataract Sx'es. - Denies numbness and tingling in her feet.  She also has a history of NASH, HTN, OSA.  ROS: Constitutional: no weight gain/no weight loss, + fatigue, no subjective hyperthermia, no subjective hypothermia Eyes: no blurry vision, no xerophthalmia ENT: no sore throat, no nodules palpated in throat, no dysphagia, no odynophagia, no hoarseness Cardiovascular: no CP/no SOB/no palpitations/no leg swelling Respiratory: no cough/no SOB/no wheezing Gastrointestinal: no N/no V/no D/no C/no acid reflux Musculoskeletal: no muscle aches/no joint aches Skin: no rashes, no hair loss Neurological: no tremors/no numbness/no tingling/no dizziness  I reviewed pt's medications, allergies, PMH, social hx, family hx, and changes were documented in the history of present illness. Otherwise, unchanged from my initial visit note.  Past Medical History:  Diagnosis Date  . Acute bronchitis 10/31/2015  . Allergy    seasonal  . Arthritis    knee-left  . Cataract    right eye  . Depression    job/stress related  . Diabetes mellitus 52   type 2  . Diaphragmatic hernia without mention of obstruction or gangrene   . Endometriosis   . Esophageal reflux   .  Esophageal stricture   . GERD (gastroesophageal reflux disease)   . Hyperlipidemia   . Hyperplastic colon polyp   . Hypertension 52  . Nonalcoholic fatty liver disease 04/28/2010   Qualifier: Diagnosis of  By: Arnoldo Morale MD, Balinda Quails   . Nonspecific abnormal results of liver function study   . Pharyngitis 02/18/2012  . Plantar fascial fibromatosis   . Preventative health care 01/22/2013  . Seasonal allergies 10/31/2015  . Sleep apnea    mouth piece per pt.  . Sore throat 04/03/2017  . Tubular adenoma of colon   . Unspecified sleep apnea    no c-pap   Past Surgical History:   Procedure Laterality Date  . ABDOMINAL HYSTERECTOMY  1982   partial  . APPENDECTOMY    . 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 BREAST CYST INCISION AND DRAINAGE     right breast  . CATARACT EXTRACTION     right eye  . HERNIA REPAIR     per pt, she is not aware of this surgery  . seba    . TUBAL LIGATION     Social History   Social History  . Marital status: Married    Spouse name: [name]  . Number of children: 2  . 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 Smoking status: Never Smoker  . Smokeless tobacco: Never Used  . Alcohol use No  . Drug use: No  . Sexual activity: Yes     Comment: lives with husband, no dietary restrictions just watching carbs, wears seat belt   Current Outpatient Medications on File Prior to Visit  Medication Sig Dispense Refill  . ASPIR-LOW 81 MG EC tablet TAKE 1 TABLET BY MOUTH DAILY 30 tablet 3  . BAYER CONTOUR TEST test strip USE AS INSTRUCTED 100 each 1  . bisoprolol-hydrochlorothiazide (ZIAC) 5-6.25 MG tablet Take 1 tablet by mouth daily. 90 tablet 0  . bisoprolol-hydrochlorothiazide (ZIAC) 5-6.25 MG tablet TAKE 1 TABLET BY MOUTH DAILY. 90 tablet 0  . canagliflozin (INVOKANA) 100 MG TABS tablet Take 1 tablet (100 mg total) by mouth daily before breakfast. 30 tablet 2  . dicyclomine (BENTYL) 10 MG capsule Take 1 capsule (10 mg total) by mouth 3 (three) times daily before meals. 90 capsule 11  . glimepiride (AMARYL) 4 MG tablet Take 0.5 tablets (2 mg total) 2 (two) times daily by mouth. 45 tablet 3  . INVOKANA 100 MG TABS tablet TAKE 1 TABLET BY MOUTH DAILY. 30 tablet 5  . JANUVIA 100 MG tablet TAKE 1 TABLET (100 MG TOTAL) BY MOUTH DAILY. 30 tablet 3  . KRILL OIL PO Take 1 tablet by mouth daily.     . metFORMIN (GLUCOPHAGE) 1000 MG tablet TAKE 1 TABLET BY MOUTH TWICE A DAY AND TAKE 1/2 TABLET AT NOON 75 tablet 1  . omeprazole (PRILOSEC) 40 MG capsule TAKE 1 CAPSULE (40 MG) BY  MOUTH 2 TIMES A DAY. 60 capsule 1  . Probiotic Product (PROBIOTIC DAILY PO) Take 1 capsule by mouth daily.    . rosuvastatin (CRESTOR) 5 MG tablet Take 1 tablet (5 mg total) by mouth every other day. 15 tablet 2   No current facility-administered medications on file prior to visit.    Allergies  Allergen Reactions  . Pravastatin Other (See Comments)    Myalgia, fatigue, memory loss  . Amoxicillin Itching    REACTION: rash  .  Atorvastatin Other (See Comments)    Myalgia, fatigue, memory loss  . Codeine Nausea And Vomiting   Family History  Problem Relation Age of Onset  . Breast cancer Mother   . Hypertension Mother   . Diabetes Mother        type 2  . Other Mother        esophagus surgery  . Heart disease Father   . Hyperlipidemia Father   . Hypertension Father   . Heart attack Father        MI at age 81  . Diabetes Brother        type 2  . Heart disease Maternal Grandmother   . Heart attack Maternal Grandfather   . Hypertension Maternal Grandfather   . Stroke Paternal Grandmother   . Hypertension Sister   . Diabetes Sister   . Hypertension Brother   . Alcohol abuse Maternal Aunt   . Colon cancer Neg Hx   . Stomach cancer Neg Hx   . Esophageal cancer Neg Hx    Pt has FH of DM in mother, brother, sister.  PE: BP 132/76 (BP Location: Left Arm, Patient Position: Sitting, Cuff Size: Normal)   Pulse 66   Ht 5' 3.5" (1.613 m)   Wt 197 lb 6.4 oz (89.5 kg)   SpO2 97%   BMI 34.42 kg/m   Wt Readings from Last 3 Encounters:  08/24/17 197 lb 6.4 oz (89.5 kg)  07/21/17 195 lb 3.2 oz (88.5 kg)  07/18/17 192 lb (87.1 kg)   Constitutional: overweight, in NAD Eyes: PERRLA, EOMI, no exophthalmos ENT: moist mucous membranes, no thyromegaly, no cervical lymphadenopathy Cardiovascular: RRR, No MRG Respiratory: CTA B Gastrointestinal: abdomen soft, NT, ND, BS+ Musculoskeletal: no deformities, strength intact in all 4 Skin: moist, warm, no rashes Neurological: no tremor  with outstretched hands, DTR normal in all 4  ASSESSMENT: 1. DM2, non-insulin-dependent, uncontrolled, without long term complications, but with hyperglycemia  2. HL  3. Obesity class 2 BMI Classification:  < 18.5 underweight   18.5-24.9 normal weight   25.0-29.9 overweight   30.0-34.9 class I obesity   35.0-39.9 class II obesity   ? 40.0 class III obesity   PLAN:  1. Patient with long-standing, uncontrolled, diabetes, on oral antidiabetic regimen, with significant improvement in her diabetes control in the last few months.  She has no side effects from Invokana.  At last visit, we discussed about a plant-based diet as a means to improve her diabetes and I gave her references. She did not try this. We decreased her glimepiride dose at that time because she had some lows, usually with exercise. - sugars are close to goal with occasional hyperglycemic spikes. However, she also has occasional lows  - lowest 39 when she delayed lunch (39!) and also she feels she has to eat afer dinner before going to bed to avoid hypoglycemia in am >> will stop Amaryl - I suggested to:  Patient Instructions  Please continue: - Metformin 1000 mg 2x a day with meals - Januvia 100 mg before b'fast - Invokana 100 mg before b'fast  Please stop: - Glimepiride   Please try Fluvastatin XL 80 mg.  Please return in 3 months with your sugar log.   - Reviewed latest HbA1c, which decreased to 7.3% from 9.3% before last visit! - continue checking sugars at different times of the day - check 1-2x a day, rotating checks - advised for yearly eye exams >> she  needs one - Return to  clinic in 3 mo with sugar log   2. HL -Reviewed lipid panel from 06/2017: LDL very high - She previously had intolerance to statins but started Crestor before last visit.  She does have joint pain. - I suggested to try Fluvastatin XL 80 mg. - At last visit, we discussed about a plant-based diet as a means to improve both her  diabetes and her lipids  2. Obesity class 2 - She gained weight since last visit - Again discussed about improving diet - We will continue Invokana which helps with weight loss  Philemon Kingdom, MD PhD [facility]

## 2017-08-29 ENCOUNTER — Ambulatory Visit: Payer: BLUE CROSS/BLUE SHIELD | Admitting: Gastroenterology

## 2017-09-01 ENCOUNTER — Ambulatory Visit (INDEPENDENT_AMBULATORY_CARE_PROVIDER_SITE_OTHER): Payer: Medicare Other | Admitting: Family Medicine

## 2017-09-01 ENCOUNTER — Encounter: Payer: Self-pay | Admitting: Family Medicine

## 2017-09-01 VITALS — BP 117/68 | HR 96 | Resp 18 | Ht 63.5 in | Wt 193.6 lb

## 2017-09-01 DIAGNOSIS — S86819A Strain of other muscle(s) and tendon(s) at lower leg level, unspecified leg, initial encounter: Secondary | ICD-10-CM | POA: Diagnosis not present

## 2017-09-01 NOTE — Progress Notes (Signed)
[facility] [address], [place], [place] [address] [phone]  Date:  09/01/2017   Name:  [name]   DOB:  [birth year]   MRN:  [id number]  PCP:  Mosie Lukes, MD    Chief Complaint: Calf Pain (tuesday, pain when working out, heard a pop in both legs, using icy hot and tylenol)   History of Present Illness:  [name] is a 67 y.o. very pleasant female patient who presents with the following:  Pt of Dr. Charlett Blake, here today with a calf problem Today is Thursday- this past Tuesday she was doing resisted running (running while being held back by a resistance band around her middle) and felt a pop in both calves.  She had pain right away, but has been able to walk.  The left side is a bit worse than the right  She is able to walk a bit better now but she is till quite sore, esp in the am  Never had any calf or ankle issues She is using ice, Icyhot, tylenol  She has not noted any bruising on her calves or feet/ ankles      Patient Active Problem List   Diagnosis Date Noted  . Headache 06/30/2017  . Obesity (BMI 30-39.9) 05/02/2017  . Seasonal allergies 10/31/2015  . Hx of adenomatous colonic polyps 04/30/2014  . Cervical cancer screening 01/22/2013  . Preventative health care 01/22/2013  . Chest pain 01/06/2012  . Endometriosis   . Chronic cough 08/31/2011  . Nonalcoholic fatty liver disease 04/28/2010  . OSA (obstructive sleep apnea) 03/26/2009  . RT BUNDLE BRANCH BLOCK&LT ANT FASCICULAR BLOCK 12/20/2008  . Thoracic back pain 10/10/2008  . OSTEOARTHRITIS, MODERATE 07/05/2008  . Abdominal pain 07/05/2008  . Aneurysm, thoracoabdominal ([place]) 03/22/2007  . Type 2 diabetes mellitus with hyperglycemia, without long-term current use of insulin ([place]) 10/28/2006  . Hyperlipidemia, mixed 10/28/2006  . Depression with anxiety 10/28/2006  . Essential hypertension 10/28/2006  . ESOPHAGEAL STRICTURE 05/04/2004   . HIATAL HERNIA 05/04/2004    Past Medical History:  Diagnosis Date  . Acute bronchitis 10/31/2015  . Allergy    seasonal  . Arthritis    knee-left  . Cataract    right eye  . Depression    job/stress related  . Diabetes mellitus 52   type 2  . Diaphragmatic hernia without mention of obstruction or gangrene   . Endometriosis   . Esophageal reflux   . Esophageal stricture   . GERD (gastroesophageal reflux disease)   . Hyperlipidemia   . Hyperplastic colon polyp   . Hypertension 52  . Nonalcoholic fatty liver disease 04/28/2010   Qualifier: Diagnosis of  By: Arnoldo Morale MD, Balinda Quails   . Nonspecific abnormal results of liver function study   . Pharyngitis 02/18/2012  . Plantar fascial fibromatosis   . Preventative health care 01/22/2013  . Seasonal allergies 10/31/2015  . Sleep apnea    mouth piece per pt.  . Sore throat 04/03/2017  . Tubular adenoma of colon   . Unspecified sleep apnea    no c-pap    Past Surgical History:  Procedure Laterality Date  . ABDOMINAL HYSTERECTOMY  1982   partial  . APPENDECTOMY    . 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 BREAST CYST INCISION AND DRAINAGE     right breast  . CATARACT EXTRACTION     right eye  . HERNIA  REPAIR     per pt, she is not aware of this surgery  . seba    . TUBAL LIGATION      Social History   Tobacco Use  . Smoking status: Never Smoker  . Smokeless tobacco: Never Used  Substance Use Topics  . Alcohol use: No    Alcohol/week: 0.0 oz  . Drug use: No    Family History  Problem Relation Age of Onset  . Breast cancer Mother   . Hypertension Mother   . Diabetes Mother        type 2  . Other Mother        esophagus surgery  . Heart disease Father   . Hyperlipidemia Father   . Hypertension Father   . Heart attack Father        MI at age 91  . Diabetes Brother        type 2  . Heart disease Maternal Grandmother   . Heart attack Maternal Grandfather   . Hypertension Maternal Grandfather   . Stroke  Paternal Grandmother   . Hypertension Sister   . Diabetes Sister   . Hypertension Brother   . Alcohol abuse Maternal Aunt   . Colon cancer Neg Hx   . Stomach cancer Neg Hx   . Esophageal cancer Neg Hx     Allergies  Allergen Reactions  . Pravastatin Other (See Comments)    Myalgia, fatigue, memory loss  . Amoxicillin Itching    REACTION: rash  . Atorvastatin Other (See Comments)    Myalgia, fatigue, memory loss  . Codeine Nausea And Vomiting    Medication list has been reviewed and updated.  Current Outpatient Medications on File Prior to Visit  Medication Sig Dispense Refill  . ASPIR-LOW 81 MG EC tablet TAKE 1 TABLET BY MOUTH DAILY 30 tablet 3  . BAYER CONTOUR TEST test strip USE AS INSTRUCTED 100 each 1  . bisoprolol-hydrochlorothiazide (ZIAC) 5-6.25 MG tablet Take 1 tablet by mouth daily. 90 tablet 0  . bisoprolol-hydrochlorothiazide (ZIAC) 5-6.25 MG tablet TAKE 1 TABLET BY MOUTH DAILY. 90 tablet 0  . canagliflozin (INVOKANA) 100 MG TABS tablet Take 1 tablet (100 mg total) by mouth daily before breakfast. 30 tablet 2  . dicyclomine (BENTYL) 10 MG capsule Take 1 capsule (10 mg total) by mouth 3 (three) times daily before meals. 90 capsule 11  . fluvastatin XL (LESCOL XL) 80 MG 24 hr tablet Take 1 tablet (80 mg total) by mouth daily. 30 tablet 11  . INVOKANA 100 MG TABS tablet TAKE 1 TABLET BY MOUTH DAILY. 30 tablet 5  . JANUVIA 100 MG tablet TAKE 1 TABLET (100 MG TOTAL) BY MOUTH DAILY. 30 tablet 3  . KRILL OIL PO Take 1 tablet by mouth daily.     . metFORMIN (GLUCOPHAGE) 1000 MG tablet TAKE 1 TABLET BY MOUTH TWICE A DAY AND TAKE 1/2 TABLET AT NOON 75 tablet 1  . omeprazole (PRILOSEC) 40 MG capsule TAKE 1 CAPSULE (40 MG) BY MOUTH 2 TIMES A DAY. 60 capsule 1  . Probiotic Product (PROBIOTIC DAILY PO) Take 1 capsule by mouth daily.    . rosuvastatin (CRESTOR) 5 MG tablet Take 1 tablet (5 mg total) by mouth every other day. 15 tablet 2   No current facility-administered  medications on file prior to visit.     Review of Systems:  As per HPI- otherwise negative. No fever or chills No other injury Otherwise feeling well today  Physical Examination: Vitals:  09/01/17 1124  BP: 117/68  Pulse: 96  Resp: 18  SpO2: 99%   Vitals:   09/01/17 1124  Weight: 193 lb 9.6 oz (87.8 kg)  Height: 5' 3.5" (1.613 m)   Body mass index is 33.76 kg/m. Ideal Body Weight: Weight in (lb) to have BMI = 25: 143.1  GEN: WDWN, NAD, Non-toxic, A & O x 3, overweight, looks well  HEENT: Atraumatic, Normocephalic. Neck supple. No masses, No LAD. Ears and Nose: No external deformity. CV: RRR, No M/G/R. No JVD. No thrill. No extra heart sounds. PULM: CTA B, no wheezes, crackles, rhonchi. No retractions. No resp. distress. No accessory muscle use. EXTR: No c/c/e NEURO walking slowly due to calf pain PSYCH: Normally interactive. Conversant. Not depressed or anxious appearing.  Calm demeanor.  She is tender in the prox posterior calf muscles bilaterally, but not severely tender No bruise No asymmetry of the calves noted Ankles, knees, feet ok Achilles intact bilaterally Normal strength of quads/ hams She is able to do a toe raise with each foot although it does hurt     Assessment and Plan: Strain of calf muscle, initial encounter here today with a calf muscle strain, bilaterally Thankfully no evidence of an achilles tear Discussed relative rest and gradual return to exercise once healed She will alert me if not getting better as expected See patient instructions for more details.     Signed Lamar Blinks, MD

## 2017-09-01 NOTE — Patient Instructions (Signed)
It seems that you have a calf muscle strain (tear of the muscle fibers) Your achilles tendons are intact  Rest- no intense exercise until pain is resolved, and then return gradually Ice, heat, ibuprofen can be helpful Let me know if you are not improving over the next week- 10 days; Sooner if worse.  Take care!

## 2017-09-13 MED FILL — metFORMIN HCL 1000 MG TABS: 1000 | 30 days supply | Qty: 75 | Fill #0

## 2017-09-20 DIAGNOSIS — E119 Type 2 diabetes mellitus without complications: Secondary | ICD-10-CM | POA: Diagnosis not present

## 2017-09-20 LAB — HM DIABETES EYE EXAM

## 2017-09-21 ENCOUNTER — Other Ambulatory Visit: Payer: Self-pay | Admitting: Family Medicine

## 2017-09-21 MED FILL — JANUVIA 100 MG TABLET: 100 | 30 days supply | Qty: 30 | Fill #3

## 2017-09-21 MED FILL — OMEPRAZOLE DR 40 MG CAPSULE: 40 | 30 days supply | Qty: 60 | Fill #0

## 2017-09-21 MED FILL — INVOKANA 100 MG TABLET: 100 | 30 days supply | Qty: 30 | Fill #1

## 2017-09-29 ENCOUNTER — Ambulatory Visit (INDEPENDENT_AMBULATORY_CARE_PROVIDER_SITE_OTHER): Payer: Medicare Other | Admitting: Family Medicine

## 2017-09-29 ENCOUNTER — Encounter: Payer: Self-pay | Admitting: Family Medicine

## 2017-09-29 VITALS — BP 108/64 | HR 71 | Temp 98.0°F | Resp 18 | Wt 196.0 lb

## 2017-09-29 DIAGNOSIS — E782 Mixed hyperlipidemia: Secondary | ICD-10-CM | POA: Diagnosis not present

## 2017-09-29 DIAGNOSIS — I10 Essential (primary) hypertension: Secondary | ICD-10-CM | POA: Diagnosis not present

## 2017-09-29 DIAGNOSIS — M25511 Pain in right shoulder: Secondary | ICD-10-CM

## 2017-09-29 DIAGNOSIS — M79662 Pain in left lower leg: Secondary | ICD-10-CM

## 2017-09-29 DIAGNOSIS — G8929 Other chronic pain: Secondary | ICD-10-CM

## 2017-09-29 DIAGNOSIS — M79661 Pain in right lower leg: Secondary | ICD-10-CM | POA: Diagnosis not present

## 2017-09-29 DIAGNOSIS — E1165 Type 2 diabetes mellitus with hyperglycemia: Secondary | ICD-10-CM | POA: Diagnosis not present

## 2017-09-29 LAB — CBC
HEMATOCRIT: 41.4 % (ref 36.0–46.0)
HEMOGLOBIN: 13.5 g/dL (ref 12.0–15.0)
MCHC: 32.7 g/dL (ref 30.0–36.0)
MCV: 79.8 fl (ref 78.0–100.0)
PLATELETS: 225 10*3/uL (ref 150.0–400.0)
RBC: 5.19 Mil/uL — AB (ref 3.87–5.11)
RDW: 14.6 % (ref 11.5–15.5)
WBC: 5.7 10*3/uL (ref 4.0–10.5)

## 2017-09-29 LAB — COMPREHENSIVE METABOLIC PANEL
ALK PHOS: 65 U/L (ref 39–117)
ALT: 46 U/L — ABNORMAL HIGH (ref 0–35)
AST: 31 U/L (ref 0–37)
Albumin: 4 g/dL (ref 3.5–5.2)
BUN: 12 mg/dL (ref 6–23)
CALCIUM: 9.5 mg/dL (ref 8.4–10.5)
CHLORIDE: 100 meq/L (ref 96–112)
CO2: 27 mEq/L (ref 19–32)
Creatinine, Ser: 0.88 mg/dL (ref 0.40–1.20)
GFR: 82.92 mL/min (ref >60.00)
Glucose, Bld: 117 mg/dL — ABNORMAL HIGH (ref 70–99)
POTASSIUM: 3.9 meq/L (ref 3.5–5.1)
SODIUM: 136 meq/L (ref 135–145)
TOTAL PROTEIN: 7.2 g/dL (ref 6.0–8.3)
Total Bilirubin: 0.5 mg/dL (ref 0.2–1.2)

## 2017-09-29 LAB — LIPID PANEL
Cholesterol: 211 mg/dL — ABNORMAL HIGH (ref 0–200)
HDL: 39.9 mg/dL (ref >39.00)
LDL CALC: 150 mg/dL — AB (ref 0–99)
NonHDL: 171.38
TRIGLYCERIDES: 107 mg/dL (ref 0.0–149.0)
Total CHOL/HDL Ratio: 5
VLDL: 21.4 mg/dL (ref 0.0–40.0)

## 2017-09-29 LAB — TSH: TSH: 1.12 u[IU]/mL (ref 0.35–4.50)

## 2017-09-29 LAB — HEMOGLOBIN A1C: Hgb A1c MFr Bld: 7.1 % — ABNORMAL HIGH (ref 4.6–6.5)

## 2017-09-29 NOTE — Patient Instructions (Signed)
Try mixing Miralax with Benefiber together once or twice daily Tylenol/Acetaminophen ES 500 mg tabs, 1-2 tabs po twice daily, max of 3000 mg in 24 hours.   Shoulder Pain Many things can cause shoulder pain, including:  An injury to the area.  Overuse of the shoulder.  Arthritis.  The source of the pain can be:  Inflammation.  An injury to the shoulder joint.  An injury to a tendon, ligament, or bone.  Follow these instructions at home: Take these actions to help with your pain:  Squeeze a soft ball or a foam pad as much as possible. This helps to keep the shoulder from swelling. It also helps to strengthen the arm.  Take over-the-counter and prescription medicines only as told by your health care provider.  If directed, apply ice to the area: ? Put ice in a plastic bag. ? Place a towel between your skin and the bag. ? Leave the ice on for 20 minutes, 2-3 times per day. Stop applying ice if it does not help with the pain.  If you were given a shoulder sling or immobilizer: ? Wear it as told. ? Remove it to shower or bathe. ? Move your arm as little as possible, but keep your hand moving to prevent swelling.  Contact a health care provider if:  Your pain gets worse.  Your pain is not relieved with medicines.  New pain develops in your arm, hand, or fingers. Get help right away if:  Your arm, hand, or fingers: ? Tingle. ? Become numb. ? Become swollen. ? Become painful. ? Turn white or blue. This information is not intended to replace advice given to you by your health care provider. Make sure you discuss any questions you have with your health care provider. Document Released: 03/10/2005 Document Revised: 01/25/2016 Document Reviewed: 09/23/2014 Elsevier Interactive Patient Education  [employer].

## 2017-09-29 NOTE — Assessment & Plan Note (Signed)
Well controlled, no changes to meds. Encouraged heart healthy diet such as the DASH diet and exercise as tolerated.  °

## 2017-09-29 NOTE — Assessment & Plan Note (Addendum)
hgba1c acceptable, minimize simple carbs. Increase exercise as tolerated. Follows with Dr Cruzita Lederer

## 2017-09-29 NOTE — Progress Notes (Signed)
Subjective:  I acted as a Education administrator for Dr. Charlett Blake. [place], [place]  Patient ID: [name], female    DOB: [birth year], 67 y.o.   MRN: [id number]  No chief complaint on file.   HPI  Patient is in today for a 3 month follow up and she is noting persistent and worsening right shoulder pain. No recent fall or acute injury but the pain is beginning to limit her daily activities some. She injured b/l calves a couple of weeks ago but that pain is improving and much better presently. Denies CP/palp/SOB/HA/congestion/fevers/GI or GU c/o. Taking meds as prescribed  Patient Care Team: Mosie Lukes, MD as PCP - General (Family Medicine) Jola Schmidt, MD as Consulting Physician (Ophthalmology) Royston Sinner Colin Benton, MD as Consulting Physician (Obstetrics and Gynecology)   Past Medical History:  Diagnosis Date  . Acute bronchitis 10/31/2015  . Allergy    seasonal  . Arthritis    knee-left  . Cataract    right eye  . Depression    job/stress related  . Diabetes mellitus 52   type 2  . Diaphragmatic hernia without mention of obstruction or gangrene   . Endometriosis   . Esophageal reflux   . Esophageal stricture   . GERD (gastroesophageal reflux disease)   . Hyperlipidemia   . Hyperplastic colon polyp   . Hypertension 52  . Nonalcoholic fatty liver disease 04/28/2010   Qualifier: Diagnosis of  By: Arnoldo Morale MD, Balinda Quails   . Nonspecific abnormal results of liver function study   . Pharyngitis 02/18/2012  . Plantar fascial fibromatosis   . Preventative health care 01/22/2013  . Seasonal allergies 10/31/2015  . Sleep apnea    mouth piece per pt.  . Sore throat 04/03/2017  . Tubular adenoma of colon   . Unspecified sleep apnea    no c-pap    Past Surgical History:  Procedure Laterality Date  . ABDOMINAL HYSTERECTOMY  1982   partial  . APPENDECTOMY    . 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 BREAST CYST INCISION AND DRAINAGE     right breast  . CATARACT EXTRACTION     right eye    . HERNIA REPAIR     per pt, she is not aware of this surgery  . seba    . TUBAL LIGATION      Family History  Problem Relation Age of Onset  . Breast cancer Mother   . Hypertension Mother   . Diabetes Mother        type 2  . Other Mother        esophagus surgery  . Heart disease Father   . Hyperlipidemia Father   . Hypertension Father   . Heart attack Father        MI at age 74  . Diabetes Brother        type 2  . Heart disease Maternal Grandmother   . Heart attack Maternal Grandfather   . Hypertension Maternal Grandfather   . Stroke Paternal Grandmother   . Hypertension Sister   . Diabetes Sister   . Hypertension Brother   . Alcohol abuse Maternal Aunt   . Colon cancer Neg Hx   . Stomach cancer Neg Hx   . Esophageal cancer Neg Hx     Social History   Socioeconomic History  . Marital status: Married    Spouse name: [name]  . Number of children: 2  . Years of education: Not on file  .  Highest education level: Not on file  Occupational History  . Occupation: Building surveyor: [employer]  . Financial resource strain: Not on file  . Food insecurity:    Worry: Not on file    Inability: Not on file  . Transportation needs:    Medical: Not on file    Non-medical: Not on file  Tobacco Use  . Smoking status: Never Smoker  . Smokeless tobacco: Never Used  Substance and Sexual Activity  . Alcohol use: No    Alcohol/week: 0.0 oz  . Drug use: No  . Sexual activity: Yes    Comment: lives with husband, no dietary restrictions just watching carbs, wears seat belt  Lifestyle  . Physical activity:    Days per week: Not on file    Minutes per session: Not on file  . Stress: Not on file  Relationships  . Social connections:    Talks on phone: Not on file    Gets together: Not on file    Attends religious service: Not on file    Active member of club or organization: Not on file    Attends meetings of clubs or organizations: Not on file     Relationship status: Not on file  . Intimate partner violence:    Fear of current or ex partner: Not on file    Emotionally abused: Not on file    Physically abused: Not on file    Forced sexual activity: Not on file  Other Topics Concern  . Not on file  Social History Narrative  . Not on file    Outpatient Medications Prior to Visit  Medication Sig Dispense Refill  . ASPIR-LOW 81 MG EC tablet TAKE 1 TABLET BY MOUTH DAILY 30 tablet 3  . BAYER CONTOUR TEST test strip USE AS INSTRUCTED 100 each 1  . bisoprolol-hydrochlorothiazide (ZIAC) 5-6.25 MG tablet TAKE 1 TABLET BY MOUTH DAILY. 90 tablet 0  . dicyclomine (BENTYL) 10 MG capsule Take 1 capsule (10 mg total) by mouth 3 (three) times daily before meals. 90 capsule 11  . fluvastatin XL (LESCOL XL) 80 MG 24 hr tablet Take 1 tablet (80 mg total) by mouth daily. 30 tablet 11  . INVOKANA 100 MG TABS tablet TAKE 1 TABLET BY MOUTH DAILY. 30 tablet 5  . JANUVIA 100 MG tablet TAKE 1 TABLET (100 MG TOTAL) BY MOUTH DAILY. 30 tablet 3  . KRILL OIL PO Take 1 tablet by mouth daily.     . metFORMIN (GLUCOPHAGE) 1000 MG tablet TAKE 1 TABLET BY MOUTH TWICE A DAY AND TAKE 1/2 TABLET AT NOON 75 tablet 1  . omeprazole (PRILOSEC) 40 MG capsule TAKE ONE CAPSULE BY MOUTH 2 TIMES A DAY 60 capsule 1  . Probiotic Product (PROBIOTIC DAILY PO) Take 1 capsule by mouth daily.    . rosuvastatin (CRESTOR) 5 MG tablet Take 1 tablet (5 mg total) by mouth every other day. 15 tablet 2  . bisoprolol-hydrochlorothiazide (ZIAC) 5-6.25 MG tablet Take 1 tablet by mouth daily. 90 tablet 0  . canagliflozin (INVOKANA) 100 MG TABS tablet Take 1 tablet (100 mg total) by mouth daily before breakfast. 30 tablet 2   No facility-administered medications prior to visit.     Allergies  Allergen Reactions  . Pravastatin Other (See Comments)    Myalgia, fatigue, memory loss  . Amoxicillin Itching    REACTION: rash  . Atorvastatin Other (See Comments)    Myalgia, fatigue, memory  loss  . Codeine Nausea And Vomiting    Review of Systems  Constitutional: Negative for fever and malaise/fatigue.  HENT: Negative for congestion.   Eyes: Negative for blurred vision and double vision.  Respiratory: Negative for shortness of breath.   Cardiovascular: Negative for chest pain, palpitations and leg swelling.  Gastrointestinal: Negative for abdominal pain, blood in stool and nausea.  Genitourinary: Negative for dysuria and frequency.  Musculoskeletal: Positive for joint pain. Negative for falls.  Skin: Negative for rash.  Neurological: Negative for dizziness, loss of consciousness and headaches.  Endo/Heme/Allergies: Negative for environmental allergies.  Psychiatric/Behavioral: Negative for depression. The patient is not nervous/anxious.        Objective:    Physical Exam  Constitutional: She is oriented to person, place, and time. She appears well-developed and well-nourished. No distress.  HENT:  Head: Normocephalic and atraumatic.  Nose: Nose normal.  Eyes: Right eye exhibits no discharge. Left eye exhibits no discharge.  Neck: Normal range of motion. Neck supple.  Cardiovascular: Normal rate and regular rhythm.  No murmur heard. Pulmonary/Chest: Effort normal and breath sounds normal.  Abdominal: Soft. Bowel sounds are normal. There is no tenderness.  Musculoskeletal: She exhibits no edema.  Neurological: She is alert and oriented to person, place, and time.  Skin: Skin is warm and dry.  Psychiatric: She has a normal mood and affect.  Nursing note and vitals reviewed.   BP 108/64 (BP Location: Left Arm, Patient Position: Sitting, Cuff Size: Normal)   Pulse 71   Temp 98 F (36.7 C) (Oral)   Resp 18   Wt 196 lb (88.9 kg)   SpO2 98%   BMI 34.18 kg/m  Wt Readings from Last 3 Encounters:  09/29/17 196 lb (88.9 kg)  09/01/17 193 lb 9.6 oz (87.8 kg)  08/24/17 197 lb 6.4 oz (89.5 kg)   BP Readings from Last 3 Encounters:  09/29/17 108/64  09/01/17  117/68  08/24/17 132/76     Immunization History  Administered Date(s) Administered  . Influenza Split 03/22/2011, 02/17/2012  . Influenza Whole 03/22/2007, 03/19/2008, 04/28/2010  . Influenza,inj,Quad PF,6+ Mos 02/26/2013, 03/04/2014, 04/01/2017  . Pneumococcal Conjugate-13 05/28/2013  . Pneumococcal Polysaccharide-23 09/04/2015  . Td 10/12/2001  . Tdap 01/03/2012    Health Maintenance  Topic Date Due  . HIV Screening  09/08/1967  . OPHTHALMOLOGY EXAM  08/10/2016  . URINE MICROALBUMIN  12/03/2016  . FOOT EXAM  07/09/2017  . DEXA SCAN  09/07/2017  . INFLUENZA VACCINE  01/12/2018  . HEMOGLOBIN A1C  03/31/2018  . MAMMOGRAM  02/03/2019  . PAP SMEAR  07/10/2019  . PNA vac Low Risk Adult (2 of 2 - PPSV23) 09/03/2020  . COLONOSCOPY  11/17/2020  . TETANUS/TDAP  01/02/2022  . Hepatitis C Screening  Completed    Lab Results  Component Value Date   WBC 5.7 09/29/2017   HGB 13.5 09/29/2017   HCT 41.4 09/29/2017   PLT 225.0 09/29/2017   GLUCOSE 117 (H) 09/29/2017   CHOL 211 (H) 09/29/2017   TRIG 107.0 09/29/2017   HDL 39.90 09/29/2017   LDLCALC 150 (H) 09/29/2017   ALT 46 (H) 09/29/2017   AST 31 09/29/2017   NA 136 09/29/2017   K 3.9 09/29/2017   CL 100 09/29/2017   CREATININE 0.88 09/29/2017   BUN 12 09/29/2017   CO2 27 09/29/2017   TSH 1.12 09/29/2017   HGBA1C 7.1 (H) 09/29/2017   MICROALBUR <0.7 12/04/2015    Lab Results  Component Value Date  TSH 1.12 09/29/2017   Lab Results  Component Value Date   WBC 5.7 09/29/2017   HGB 13.5 09/29/2017   HCT 41.4 09/29/2017   MCV 79.8 09/29/2017   PLT 225.0 09/29/2017   Lab Results  Component Value Date   NA 136 09/29/2017   K 3.9 09/29/2017   CO2 27 09/29/2017   GLUCOSE 117 (H) 09/29/2017   BUN 12 09/29/2017   CREATININE 0.88 09/29/2017   BILITOT 0.5 09/29/2017   ALKPHOS 65 09/29/2017   AST 31 09/29/2017   ALT 46 (H) 09/29/2017   PROT 7.2 09/29/2017   ALBUMIN 4.0 09/29/2017   CALCIUM 9.5 09/29/2017    ANIONGAP 16 (H) 12/19/2013   GFR 82.92 09/29/2017   Lab Results  Component Value Date   CHOL 211 (H) 09/29/2017   Lab Results  Component Value Date   HDL 39.90 09/29/2017   Lab Results  Component Value Date   LDLCALC 150 (H) 09/29/2017   Lab Results  Component Value Date   TRIG 107.0 09/29/2017   Lab Results  Component Value Date   CHOLHDL 5 09/29/2017   Lab Results  Component Value Date   HGBA1C 7.1 (H) 09/29/2017         Assessment & Plan:   Problem List Items Addressed This Visit    Type 2 diabetes mellitus with hyperglycemia, without long-term current use of insulin (HCC)    hgba1c acceptable, minimize simple carbs. Increase exercise as tolerated. Follows with Dr Cruzita Lederer      Relevant Orders   Hemoglobin A1c (Completed)   Hyperlipidemia, mixed    Tolerating statin, encouraged heart healthy diet, avoid trans fats, minimize simple carbs and saturated fats. Increase exercise as tolerated      Relevant Orders   Lipid panel (Completed)   Essential hypertension    Well controlled, no changes to meds. Encouraged heart healthy diet such as the DASH diet and exercise as tolerated.       Relevant Orders   CBC (Completed)   Comprehensive metabolic panel (Completed)   TSH (Completed)   Bilateral calf pain    She strained both calves a couple of weeks ago but the pain is slowly improving. No swelling or redness or warmth associated. Is referred to sports med for her shoulder pain but if this pain persists she may see Sports med for this aswell      Shoulder pain, right - Primary    Persistent and worsening. Referred to sports medicine for further evaluation      Relevant Orders   DG Shoulder Right   Ambulatory referral to Sports Medicine      I have discontinued Joycelyn Schmid Silverman's canagliflozin. I am also having her maintain her BAYER CONTOUR TEST, Probiotic Product (PROBIOTIC DAILY PO), KRILL OIL PO, ASPIR-LOW, metFORMIN, rosuvastatin, JANUVIA,  bisoprolol-hydrochlorothiazide, dicyclomine, INVOKANA, fluvastatin XL, and omeprazole.  No orders of the defined types were placed in this encounter.   CMA served as Education administrator during this visit. History, Physical and Plan performed by medical provider. Documentation and orders reviewed and attested to.  [name], MD

## 2017-09-29 NOTE — Assessment & Plan Note (Signed)
Tolerating statin, encouraged heart healthy diet, avoid trans fats, minimize simple carbs and saturated fats. Increase exercise as tolerated 

## 2017-10-03 DIAGNOSIS — M25511 Pain in right shoulder: Secondary | ICD-10-CM | POA: Insufficient documentation

## 2017-10-03 DIAGNOSIS — M79662 Pain in left lower leg: Secondary | ICD-10-CM

## 2017-10-03 DIAGNOSIS — M79661 Pain in right lower leg: Secondary | ICD-10-CM | POA: Insufficient documentation

## 2017-10-03 NOTE — Assessment & Plan Note (Addendum)
She strained both calves a couple of weeks ago but the pain is slowly improving. No swelling or redness or warmth associated. Is referred to sports med for her shoulder pain but if this pain persists she may see Sports med for this aswell

## 2017-10-03 NOTE — Assessment & Plan Note (Signed)
Persistent and worsening. Referred to sports medicine for further evaluation

## 2017-10-04 ENCOUNTER — Ambulatory Visit (INDEPENDENT_AMBULATORY_CARE_PROVIDER_SITE_OTHER): Payer: Medicare Other | Admitting: Family Medicine

## 2017-10-04 ENCOUNTER — Encounter: Payer: Self-pay | Admitting: Family Medicine

## 2017-10-04 DIAGNOSIS — G8929 Other chronic pain: Secondary | ICD-10-CM | POA: Diagnosis not present

## 2017-10-04 DIAGNOSIS — M25511 Pain in right shoulder: Secondary | ICD-10-CM

## 2017-10-04 MED ORDER — METHYLPREDNISOLONE ACETATE 40 MG/ML IJ SUSP
40.0000 mg | Freq: Once | INTRAMUSCULAR | Status: AC
  Administered 2017-10-04: 40 mg via INTRA_ARTICULAR

## 2017-10-04 NOTE — Patient Instructions (Signed)
You have rotator cuff impingement but also have evidence of nerve irritation with this. Try to avoid painful activities (overhead activities, lifting with extended arm) as much as possible. Tylenol 500mg  1-2 tabs three times a day as needed for pain. Subacromial injection may be beneficial to help with pain and to decrease inflammation - you were given this today. Consider physical therapy with transition to home exercise program. Do home exercise program with theraband and scapular stabilization exercises daily 3 sets of 10 once a day wait 3-5 days before starting this. If not improving at follow-up we will consider further imaging, physical therapy, and/or nitro patches. Follow up with me in 1 month.

## 2017-10-10 ENCOUNTER — Encounter: Payer: Self-pay | Admitting: Family Medicine

## 2017-10-10 NOTE — Progress Notes (Signed)
PCP and consultation requested by: Mosie Lukes, MD  Subjective:   HPI: Patient is a 67 y.o. female here for right shoulder pain.  Patient reports for about 1 year now she's had pain in anterior right shoulder. Pain is sharp at 7/10 level but up to 10/10 level at times. No numbness. Aches with any movement. + night pain. Tried tylenol, ice/heat, topical meication. Does get radiation into arm to ring finger with some numbness at times. No skin changes. Right handed.   Past Medical History:  Diagnosis Date  . Acute bronchitis 10/31/2015  . Allergy    seasonal  . Arthritis    knee-left  . Cataract    right eye  . Depression    job/stress related  . Diabetes mellitus 52   type 2  . Diaphragmatic hernia without mention of obstruction or gangrene   . Endometriosis   . Esophageal reflux   . Esophageal stricture   . GERD (gastroesophageal reflux disease)   . Hyperlipidemia   . Hyperplastic colon polyp   . Hypertension 52  . Nonalcoholic fatty liver disease 04/28/2010   Qualifier: Diagnosis of  By: Arnoldo Morale MD, Balinda Quails   . Nonspecific abnormal results of liver function study   . Pharyngitis 02/18/2012  . Plantar fascial fibromatosis   . Preventative health care 01/22/2013  . Seasonal allergies 10/31/2015  . Sleep apnea    mouth piece per pt.  . Sore throat 04/03/2017  . Tubular adenoma of colon   . Unspecified sleep apnea    no c-pap    Current Outpatient Medications on File Prior to Visit  Medication Sig Dispense Refill  . ASPIR-LOW 81 MG EC tablet TAKE 1 TABLET BY MOUTH DAILY 30 tablet 3  . BAYER CONTOUR TEST test strip USE AS INSTRUCTED 100 each 1  . bisoprolol-hydrochlorothiazide (ZIAC) 5-6.25 MG tablet TAKE 1 TABLET BY MOUTH DAILY. 90 tablet 0  . dicyclomine (BENTYL) 10 MG capsule Take 1 capsule (10 mg total) by mouth 3 (three) times daily before meals. 90 capsule 11  . fluvastatin XL (LESCOL XL) 80 MG 24 hr tablet Take 1 tablet (80 mg total) by mouth daily. 30  tablet 11  . INVOKANA 100 MG TABS tablet TAKE 1 TABLET BY MOUTH DAILY. 30 tablet 5  . JANUVIA 100 MG tablet TAKE 1 TABLET (100 MG TOTAL) BY MOUTH DAILY. 30 tablet 3  . KRILL OIL PO Take 1 tablet by mouth daily.     . metFORMIN (GLUCOPHAGE) 1000 MG tablet TAKE 1 TABLET BY MOUTH TWICE A DAY AND TAKE 1/2 TABLET AT NOON 75 tablet 1  . omeprazole (PRILOSEC) 40 MG capsule TAKE ONE CAPSULE BY MOUTH 2 TIMES A DAY 60 capsule 1  . Probiotic Product (PROBIOTIC DAILY PO) Take 1 capsule by mouth daily.    . rosuvastatin (CRESTOR) 5 MG tablet Take 1 tablet (5 mg total) by mouth every other day. 15 tablet 2   No current facility-administered medications on file prior to visit.     Past Surgical History:  Procedure Laterality Date  . ABDOMINAL HYSTERECTOMY  1982   partial  . APPENDECTOMY    . 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 BREAST CYST INCISION AND DRAINAGE     right breast  . CATARACT EXTRACTION     right eye  . HERNIA REPAIR     per pt, she is not aware of this surgery  . seba    . TUBAL LIGATION  Allergies  Allergen Reactions  . Pravastatin Other (See Comments)    Myalgia, fatigue, memory loss  . Amoxicillin Itching    REACTION: rash  . Atorvastatin Other (See Comments)    Myalgia, fatigue, memory loss  . Codeine Nausea And Vomiting    Social History   Socioeconomic History  . Marital status: Married    Spouse name: [name]  . Number of children: 2  . Years of education: Not on file  . Highest education level: Not on file  Occupational History  . Occupation: Building surveyor: [employer]  . Financial resource strain: Not on file  . Food insecurity:    Worry: Not on file    Inability: Not on file  . Transportation needs:    Medical: Not on file    Non-medical: Not on file  Tobacco Use  . Smoking status: Never Smoker  . Smokeless tobacco: Never Used  Substance and Sexual Activity  . Alcohol use: No    Alcohol/week: 0.0 oz  . Drug use:  No  . Sexual activity: Yes    Comment: lives with husband, no dietary restrictions just watching carbs, wears seat belt  Lifestyle  . Physical activity:    Days per week: Not on file    Minutes per session: Not on file  . Stress: Not on file  Relationships  . Social connections:    Talks on phone: Not on file    Gets together: Not on file    Attends religious service: Not on file    Active member of club or organization: Not on file    Attends meetings of clubs or organizations: Not on file    Relationship status: Not on file  . Intimate partner violence:    Fear of current or ex partner: Not on file    Emotionally abused: Not on file    Physically abused: Not on file    Forced sexual activity: Not on file  Other Topics Concern  . Not on file  Social History Narrative  . Not on file    Family History  Problem Relation Age of Onset  . Breast cancer Mother   . Hypertension Mother   . Diabetes Mother        type 2  . Other Mother        esophagus surgery  . Heart disease Father   . Hyperlipidemia Father   . Hypertension Father   . Heart attack Father        MI at age 68  . Diabetes Brother        type 2  . Heart disease Maternal Grandmother   . Heart attack Maternal Grandfather   . Hypertension Maternal Grandfather   . Stroke Paternal Grandmother   . Hypertension Sister   . Diabetes Sister   . Hypertension Brother   . Alcohol abuse Maternal Aunt   . Colon cancer Neg Hx   . Stomach cancer Neg Hx   . Esophageal cancer Neg Hx     BP 117/79   Pulse 83   Ht 5\' 3"  (1.6 m)   Wt 190 lb (86.2 kg)   BMI 33.66 kg/m   Review of Systems: See HPI above.     Objective:  Physical Exam:  Gen: NAD, comfortable in exam room  Neck: No gross deformity, swelling, bruising. No TTP .  No midline/bony TTP. FROM. BUE strength 5/5.   Sensation intact to light touch.  2+ equal reflexes in triceps, biceps, brachioradialis tendons. Negative spurlings. NV intact distal  BUEs.  Right shoulder: No swelling, ecchymoses.  No gross deformity. No TTP. FROM. Positive Hawkins, Neers. Negative Yergasons. Strength 5/5 with empty can and resisted internal/external rotation.  Pain empty can. Negative apprehension. NV intact distally.   Assessment & Plan:  1. Right shoulder pain - patient's exam is consistent with rotator cuff impingement though she's describing possible C7 radicular symptoms as well.  She will start with home exercise program, subacromial injeciton, tylenol.  F/u in 1 month.  After informed written consent timeout was performed, patient was seated on exam table. Right shoulder was prepped with alcohol swab and utilizing posterior approach, patient's right subacromial space was injected with 3:1 bupivicaine: depomedrol. Patient tolerated the procedure well without immediate complications.

## 2017-10-10 NOTE — Assessment & Plan Note (Signed)
patient's exam is consistent with rotator cuff impingement though she's describing possible C7 radicular symptoms as well.  She will start with home exercise program, subacromial injeciton, tylenol.  F/u in 1 month.  After informed written consent timeout was performed, patient was seated on exam table. Right shoulder was prepped with alcohol swab and utilizing posterior approach, patient's right subacromial space was injected with 3:1 bupivicaine: depomedrol. Patient tolerated the procedure well without immediate complications.

## 2017-10-11 ENCOUNTER — Telehealth: Payer: Self-pay | Admitting: *Deleted

## 2017-10-11 ENCOUNTER — Other Ambulatory Visit: Payer: Self-pay | Admitting: Family Medicine

## 2017-10-11 MED FILL — GLIMEPIRIDE 4 MG TABLET: 4 | 30 days supply | Qty: 60 | Fill #3

## 2017-10-11 MED FILL — BISOPROLOL-HCTZ 5-6.25 MG T: 5-6.25 | 90 days supply | Qty: 90 | Fill #0

## 2017-10-11 NOTE — Telephone Encounter (Signed)
Received Medical records from [facility]; forwarded to provider/SLS 04/30

## 2017-10-14 ENCOUNTER — Encounter: Payer: Self-pay | Admitting: Family Medicine

## 2017-10-20 MED FILL — INVOKANA 100 MG TABLET: 100 | 30 days supply | Qty: 30 | Fill #2

## 2017-10-20 MED FILL — OMEPRAZOLE 40 MG CPDR: 40 | 30 days supply | Qty: 60 | Fill #1

## 2017-10-20 MED FILL — metFORMIN HCL 1000 MG TABS: 1000 | 30 days supply | Qty: 75 | Fill #1

## 2017-10-21 ENCOUNTER — Other Ambulatory Visit: Payer: Self-pay | Admitting: Family Medicine

## 2017-10-21 MED FILL — JANUVIA 100 MG TABLET: 100 | 30 days supply | Qty: 30 | Fill #0

## 2017-11-03 ENCOUNTER — Ambulatory Visit (INDEPENDENT_AMBULATORY_CARE_PROVIDER_SITE_OTHER): Payer: Medicare Other | Admitting: Family Medicine

## 2017-11-03 ENCOUNTER — Encounter: Payer: Self-pay | Admitting: Family Medicine

## 2017-11-03 DIAGNOSIS — G8929 Other chronic pain: Secondary | ICD-10-CM | POA: Diagnosis not present

## 2017-11-03 DIAGNOSIS — M25511 Pain in right shoulder: Secondary | ICD-10-CM

## 2017-11-03 NOTE — Patient Instructions (Signed)
You have rotator cuff impingement. Try to avoid painful activities (overhead activities, lifting with extended arm) as much as possible. Tylenol 500mg  1-2 tabs three times a day as needed for pain. Ibuprofen or aleve as needed. Let me know if you want to do physical therapy or the nitro patches between now and your follow-up. Continue your home exercises 3 sets of 10 once a day. Follow up with me in 6 weeks.

## 2017-11-07 ENCOUNTER — Encounter: Payer: Self-pay | Admitting: Family Medicine

## 2017-11-07 NOTE — Progress Notes (Signed)
PCP and consultation requested by: Mosie Lukes, MD  Subjective:   HPI: Patient is a 67 y.o. female here for right shoulder pain.  4/23: Patient reports for about 1 year now she's had pain in anterior right shoulder. Pain is sharp at 7/10 level but up to 10/10 level at times. No numbness. Aches with any movement. + night pain. Tried tylenol, ice/heat, topical meication. Does get radiation into arm to ring finger with some numbness at times. No skin changes. Right handed.  5/23: Patient reports she has improved some since last visit. Pain level 5/10, sharp. Injection helped. Takes ibuprofen or aleve if needed. Pain worse lying on her right side. Bothers with lifting heavy items, mopping. Tingling is better and more infrequent. Doing home exercises. No skin changes.  Past Medical History:  Diagnosis Date  . Acute bronchitis 10/31/2015  . Allergy    seasonal  . Arthritis    knee-left  . Cataract    right eye  . Depression    job/stress related  . Diabetes mellitus 52   type 2  . Diaphragmatic hernia without mention of obstruction or gangrene   . Endometriosis   . Esophageal reflux   . Esophageal stricture   . GERD (gastroesophageal reflux disease)   . Hyperlipidemia   . Hyperplastic colon polyp   . Hypertension 52  . Nonalcoholic fatty liver disease 04/28/2010   Qualifier: Diagnosis of  By: Arnoldo Morale MD, Balinda Quails   . Nonspecific abnormal results of liver function study   . Pharyngitis 02/18/2012  . Plantar fascial fibromatosis   . Preventative health care 01/22/2013  . Seasonal allergies 10/31/2015  . Sleep apnea    mouth piece per pt.  . Sore throat 04/03/2017  . Tubular adenoma of colon   . Unspecified sleep apnea    no c-pap    Current Outpatient Medications on File Prior to Visit  Medication Sig Dispense Refill  . ASPIR-LOW 81 MG EC tablet TAKE 1 TABLET BY MOUTH DAILY 30 tablet 3  . BAYER CONTOUR TEST test strip USE AS INSTRUCTED 100 each 1  .  bisoprolol-hydrochlorothiazide (ZIAC) 5-6.25 MG tablet TAKE 1 TABLET BY MOUTH DAILY. 90 tablet 0  . dicyclomine (BENTYL) 10 MG capsule Take 1 capsule (10 mg total) by mouth 3 (three) times daily before meals. 90 capsule 11  . fluvastatin XL (LESCOL XL) 80 MG 24 hr tablet Take 1 tablet (80 mg total) by mouth daily. 30 tablet 11  . INVOKANA 100 MG TABS tablet TAKE 1 TABLET BY MOUTH DAILY. 30 tablet 5  . JANUVIA 100 MG tablet TAKE 1 TABLET (100 MG TOTAL) BY MOUTH DAILY. 30 tablet 3  . KRILL OIL PO Take 1 tablet by mouth daily.     . metFORMIN (GLUCOPHAGE) 1000 MG tablet TAKE 1 TABLET BY MOUTH TWICE A DAY AND TAKE 1/2 TABLET AT NOON 75 tablet 1  . omeprazole (PRILOSEC) 40 MG capsule TAKE ONE CAPSULE BY MOUTH 2 TIMES A DAY 60 capsule 1  . Probiotic Product (PROBIOTIC DAILY PO) Take 1 capsule by mouth daily.    . rosuvastatin (CRESTOR) 5 MG tablet Take 1 tablet (5 mg total) by mouth every other day. 15 tablet 2   No current facility-administered medications on file prior to visit.     Past Surgical History:  Procedure Laterality Date  . ABDOMINAL HYSTERECTOMY  1982   partial  . APPENDECTOMY    . 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 BREAST  CYST INCISION AND DRAINAGE     right breast  . CATARACT EXTRACTION     right eye  . HERNIA REPAIR     per pt, she is not aware of this surgery  . seba    . TUBAL LIGATION      Allergies  Allergen Reactions  . Pravastatin Other (See Comments)    Myalgia, fatigue, memory loss  . Amoxicillin Itching    REACTION: rash  . Atorvastatin Other (See Comments)    Myalgia, fatigue, memory loss  . Codeine Nausea And Vomiting    Social History   Socioeconomic History  . Marital status: Married    Spouse name: [name]  . Number of children: 2  . Years of education: Not on file  . Highest education level: Not on file  Occupational History  . 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 Financial resource strain: Not on file   . Food insecurity:    Worry: Not on file    Inability: Not on file  . Transportation needs:    Medical: Not on file    Non-medical: Not on file  Tobacco Use  . Smoking status: Never Smoker  . Smokeless tobacco: Never Used  Substance and Sexual Activity  . Alcohol use: No    Alcohol/week: 0.0 oz  . Drug use: No  . Sexual activity: Yes    Comment: lives with husband, no dietary restrictions just watching carbs, wears seat belt  Lifestyle  . Physical activity:    Days per week: Not on file    Minutes per session: Not on file  . Stress: Not on file  Relationships  . Social connections:    Talks on phone: Not on file    Gets together: Not on file    Attends religious service: Not on file    Active member of club or organization: Not on file    Attends meetings of clubs or organizations: Not on file    Relationship status: Not on file  . Intimate partner violence:    Fear of current or ex partner: Not on file    Emotionally abused: Not on file    Physically abused: Not on file    Forced sexual activity: Not on file  Other Topics Concern  . Not on file  Social History Narrative  . Not on file    Family History  Problem Relation Age of Onset  . Breast cancer Mother   . Hypertension Mother   . Diabetes Mother        type 2  . Other Mother        esophagus surgery  . Heart disease Father   . Hyperlipidemia Father   . Hypertension Father   . Heart attack Father        MI at age 13  . Diabetes Brother        type 2  . Heart disease Maternal Grandmother   . Heart attack Maternal Grandfather   . Hypertension Maternal Grandfather   . Stroke Paternal Grandmother   . Hypertension Sister   . Diabetes Sister   . Hypertension Brother   . Alcohol abuse Maternal Aunt   . Colon cancer Neg Hx   . Stomach cancer Neg Hx   . Esophageal cancer Neg Hx     BP 124/81   Pulse 80   Ht 5\' 3"  (1.6 m)   Wt 194 lb (88 kg)   BMI 34.37  kg/m   Review of Systems: See HPI above.      Objective:  Physical Exam:  Gen: NAD, comfortable in exam room  Right shoulder: No swelling, ecchymoses.  No gross deformity. No TTP. FROM with painful arc. Positive Hawkins, negative Neers. Negative Yergasons. Strength 5/5 with empty can and resisted internal/external rotation. Negative apprehension. NV intact distally.  Assessment & Plan:  1. Right shoulder pain - consistent primarily with rotator cuff impingement though describing some tingling in C7 distribution.  Continue home exercises.  Tylenol if needed.  Ibuprofen or aleve if needed.  S/p subacromial injection.  Consider physical therapy, nitro patches. F/u in 6 weeks.

## 2017-11-07 NOTE — Assessment & Plan Note (Signed)
consistent primarily with rotator cuff impingement though describing some tingling in C7 distribution.  Continue home exercises.  Tylenol if needed.  Ibuprofen or aleve if needed.  S/p subacromial injection.  Consider physical therapy, nitro patches. F/u in 6 weeks.

## 2017-11-21 ENCOUNTER — Other Ambulatory Visit: Payer: Self-pay | Admitting: Family Medicine

## 2017-11-21 MED FILL — metFORMIN HCL 1000 MG TABS: 1000 | 30 days supply | Qty: 75 | Fill #0

## 2017-11-21 MED FILL — OMEPRAZOLE 40 MG CPDR: 40 | 30 days supply | Qty: 60 | Fill #0

## 2017-11-21 MED FILL — JANUVIA 100 MG TABLET: 100 | 30 days supply | Qty: 30 | Fill #1

## 2017-11-21 MED FILL — INVOKANA 100 MG TABLET: 100 | 30 days supply | Qty: 30 | Fill #3

## 2017-11-24 ENCOUNTER — Encounter: Payer: Self-pay | Admitting: Internal Medicine

## 2017-11-24 ENCOUNTER — Ambulatory Visit (INDEPENDENT_AMBULATORY_CARE_PROVIDER_SITE_OTHER): Payer: Medicare Other | Admitting: Internal Medicine

## 2017-11-24 VITALS — BP 124/70 | HR 84 | Ht 63.0 in | Wt 193.6 lb

## 2017-11-24 DIAGNOSIS — E1165 Type 2 diabetes mellitus with hyperglycemia: Secondary | ICD-10-CM

## 2017-11-24 DIAGNOSIS — E782 Mixed hyperlipidemia: Secondary | ICD-10-CM | POA: Diagnosis not present

## 2017-11-24 DIAGNOSIS — E669 Obesity, unspecified: Secondary | ICD-10-CM | POA: Diagnosis not present

## 2017-11-24 MED ORDER — GLIMEPIRIDE 1 MG PO TABS: ORAL_TABLET | ORAL | 3 refills | Status: DC

## 2017-11-24 MED FILL — GLIMEPIRIDE 1 MG TABLET: 1 | 90 days supply | Qty: 270 | Fill #0

## 2017-11-24 NOTE — Progress Notes (Signed)
Patient ID: [name], female   DOB: 03/29/1951, 67 y.o.   MRN: [id number]   HPI: [name] is a 67 y.o.-year-old female, returning for f/u for DM2, dx in 2001, non-insulin-dependent, uncontrolled, without long term complications. Last visit 3 months ago.  Lately she forgets Metformin with dinner.   She has rotator cuff sd. R shoulder.  Last hemoglobin A1c was: Lab Results  Component Value Date   HGBA1C 7.1 (H) 09/29/2017   HGBA1C 7.3 (H) 06/30/2017   HGBA1C 9.3 (H) 02/16/2017   Pt is on a regimen of: - Metformin 1000 mg 2x a day with meals - Januvia 100 mg before b'fast - Invokana 100 mg before b'fast - started 02/2017 - Amaryl 2 mg before a larger meal (if eating out) We were able to reduce and then stop glimepiride (previously 2 mg 2x a day before meals).  Pt checks her sugars 2-3X a day: - am: 219-250 >> 92-130, 140 >> 108-113 >> 81-122, 135 - 2h after b'fast: n/c >> 98-155, 200 >> 208 x1 >> 95-134 - before lunch:  39 (late lunch), 61, 79 >> 107-177, 200 - 2h after lunch: n/c >> 104-160 >> n/c >> 112-184, 243 (forgot the meds) - before dinner: 75-152, 184 >> 155 (after a snack) >> 87-202 - 2h after dinner:73 (exercise), 124-163 >> low 100s >> 119-207 - bedtime: n/c - nighttime: n/c Lowest sugar was 39 (on Amaryl) >> 87; she has hypoglycemia awareness in the 70 Highest sugar was 208 >> 243.  Glucometer: Scientist, research (physical sciences)  Pt's meals are: - Breakfast: scrambled eggs + bacon or oatmeal - Lunch: sandwich + meat - Dinner: veggies, chicken, creamed potatoed - Snacks: 1 reg. soda a day, PB crackers, cookie  She is walking 3 times a week for exercise. Also, now [facility].  - no CKD, last BUN/creatinine:  Lab Results  Component Value Date   BUN 12 09/29/2017   BUN 19 06/30/2017   CREATININE 0.88 09/29/2017   CREATININE 0.95 06/30/2017   -+ HL; last set of lipids: Lab Results  Component Value Date   CHOL 211 (H) 09/29/2017   HDL 39.90 09/29/2017    LDLCALC 150 (H) 09/29/2017   TRIG 107.0 09/29/2017   CHOLHDL 5 09/29/2017  She developed side effects from statins: myalgia, fatigue, memory loss. Tried Crestor 5 >> joint aches.  At last visit, I suggested fluvastatin XL 80 mg daily. She takes it maybe 1x a week b/c mm cramps, knee joint instability. - last eye exam was in 09/2017: No DR; she has a history of cataract surgeries . - no numbness and tingling in her feet.  She also has a history of NASH, HTN, OSA.  ROS: Constitutional: no weight gain/no weight loss, + fatigue, + subjective hyperthermia, no subjective hypothermia Eyes: no blurry vision, no xerophthalmia ENT: no sore throat, no nodules palpated in throat, no dysphagia, no odynophagia, no hoarseness Cardiovascular: no CP/no SOB/no palpitations/no leg swelling Respiratory: no cough/no SOB/no wheezing Gastrointestinal: no N/no V/no D/no C/no acid reflux Musculoskeletal: no muscle aches/no joint aches Skin: no rashes, no hair loss Neurological: no tremors/no numbness/no tingling/no dizziness  I reviewed pt's medications, allergies, PMH, social hx, family hx, and changes were documented in the history of present illness. Otherwise, unchanged from my initial visit note.  Past Medical History:  Diagnosis Date  . Acute bronchitis 10/31/2015  . Allergy    seasonal  . Arthritis    knee-left  . Cataract    right eye  .  Depression    job/stress related  . Diabetes mellitus 67   type 2  . Diaphragmatic hernia without mention of obstruction or gangrene   . Endometriosis   . Esophageal reflux   . Esophageal stricture   . GERD (gastroesophageal reflux disease)   . Hyperlipidemia   . Hyperplastic colon polyp   . Hypertension 67  . Nonalcoholic fatty liver disease 04/28/2010   Qualifier: Diagnosis of  By: Arnoldo Morale MD, Balinda Quails   . Nonspecific abnormal results of liver function study   . Pharyngitis 02/18/2012  . Plantar fascial fibromatosis   . Preventative health care 01/22/2013   . Seasonal allergies 10/31/2015  . Sleep apnea    mouth piece per pt.  . Sore throat 04/03/2017  . Tubular adenoma of colon   . Unspecified sleep apnea    no c-pap   Past Surgical History:  Procedure Laterality Date  . ABDOMINAL HYSTERECTOMY  1982   partial  . APPENDECTOMY    . 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 BREAST CYST INCISION AND DRAINAGE     right breast  . CATARACT EXTRACTION     right eye  . HERNIA REPAIR     per pt, she is not aware of this surgery  . seba    . TUBAL LIGATION     Social History   Social History  . Marital status: Married    Spouse name: [name]  . Number of children: 2  . 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 Smoking status: Never Smoker  . Smokeless tobacco: Never Used  . Alcohol use No  . Drug use: No  . Sexual activity: Yes     Comment: lives with husband, no dietary restrictions just watching carbs, wears seat belt   Current Outpatient Medications on File Prior to Visit  Medication Sig Dispense Refill  . ASPIR-LOW 81 MG EC tablet TAKE 1 TABLET BY MOUTH DAILY 30 tablet 3  . BAYER CONTOUR TEST test strip USE AS INSTRUCTED 100 each 1  . bisoprolol-hydrochlorothiazide (ZIAC) 5-6.25 MG tablet TAKE 1 TABLET BY MOUTH DAILY. 90 tablet 0  . dicyclomine (BENTYL) 10 MG capsule Take 1 capsule (10 mg total) by mouth 3 (three) times daily before meals. 90 capsule 11  . fluvastatin XL (LESCOL XL) 80 MG 24 hr tablet Take 1 tablet (80 mg total) by mouth daily. 30 tablet 11  . INVOKANA 100 MG TABS tablet TAKE 1 TABLET BY MOUTH DAILY. 30 tablet 5  . JANUVIA 100 MG tablet TAKE 1 TABLET (100 MG TOTAL) BY MOUTH DAILY. 30 tablet 3  . KRILL OIL PO Take 1 tablet by mouth daily.     . metFORMIN (GLUCOPHAGE) 1000 MG tablet TAKE 1 TABLET BY MOUTH TWICE A DAY AND TAKE 1/2 TABLET AT NOON 75 tablet 1  . omeprazole (PRILOSEC) 40 MG capsule TAKE ONE CAPSULE BY MOUTH 2 TIMES A DAY 60 capsule 1   . Probiotic Product (PROBIOTIC DAILY PO) Take 1 capsule by mouth daily.    . rosuvastatin (CRESTOR) 5 MG tablet Take 1 tablet (5 mg total) by mouth every other day. 15 tablet 2   No current facility-administered medications on file prior to visit.    Allergies  Allergen Reactions  . Pravastatin Other (See Comments)    Myalgia, fatigue, memory loss  . Amoxicillin Itching    REACTION: rash  . Atorvastatin Other (  See Comments)    Myalgia, fatigue, memory loss  . Codeine Nausea And Vomiting   Family History  Problem Relation Age of Onset  . Breast cancer Mother   . Hypertension Mother   . Diabetes Mother        type 2  . Other Mother        esophagus surgery  . Heart disease Father   . Hyperlipidemia Father   . Hypertension Father   . Heart attack Father        MI at age 79  . Diabetes Brother        type 2  . Heart disease Maternal Grandmother   . Heart attack Maternal Grandfather   . Hypertension Maternal Grandfather   . Stroke Paternal Grandmother   . Hypertension Sister   . Diabetes Sister   . Hypertension Brother   . Alcohol abuse Maternal Aunt   . Colon cancer Neg Hx   . Stomach cancer Neg Hx   . Esophageal cancer Neg Hx    Pt has FH of DM in mother, brother, sister.  PE: BP 124/70   Pulse 84   Ht 5\' 3"  (1.6 m)   Wt 193 lb 9.6 oz (87.8 kg)   SpO2 98%   BMI 34.29 kg/m  Wt Readings from Last 3 Encounters:  11/24/17 193 lb 9.6 oz (87.8 kg)  11/03/17 194 lb (88 kg)  10/04/17 190 lb (86.2 kg)   Constitutional: overweight, in NAD Eyes: PERRLA, EOMI, no exophthalmos ENT: moist mucous membranes, no thyromegaly, no cervical lymphadenopathy Cardiovascular: RRR, No MRG Respiratory: CTA B Gastrointestinal: abdomen soft, NT, ND, BS+ Musculoskeletal: no deformities, strength intact in all 4 Skin: moist, warm, no rashes Neurological: no tremor with outstretched hands, DTR normal in all 4  ASSESSMENT: 1. DM2, non-insulin-dependent, uncontrolled, without long  term complications, but with hyperglycemia  2. HL  3. Obesity class 2 BMI Classification:  < 18.5 underweight   18.5-24.9 normal weight   25.0-29.9 overweight   30.0-34.9 class I obesity   35.0-39.9 class II obesity   ? 40.0 class III obesity   PLAN:  1. Patient with long-standing, uncontrolled, type 2 diabetes, on oral antidiabetic regimen, with significant improvement in her diabetes control in the last 6 months.  She has no side effects from Invokana.  At last visit, we were able to stop glimepiride as she had low blood sugars, usually with exertion.  Lowest CBG was 39, when she delayed lunch but also she felt that she had to eat after dinner before going to bed to avoid hypoglycemia in the morning.  Since last visit, after stopping Amaryl, she had no more lows. - Reviewed together her latest HbA1c from 09/2017, which was improved, at 7.1%. - at this visit, she tells me she forgets pm Metformin >> sugars may increase to 200. Discussed to place Metformin on the table when she eats dinner - Will restart Amryl 1 mg in am and continue 2 mg before a larger dinner - I suggested to:  Patient Instructions  Please continue: - Metformin 1000 mg 2x a day with meals - Januvia 100 mg before b'fast - Invokana 100 mg before b'fast  Please use: - Amaryl 1 mg before b'fast and 2 mg before a larger dinner  Please return in 3 months with your sugar log.   - continue checking sugars at different times of the day - check 1x a day, rotating checks - advised for yearly eye exams >> she is UTD -  Return to clinic in 3 mo with sugar log    2. HL - Reviewed latest lipid panel from 06/2017: LDL very high  - she previously had intolerance to statins including Crestor (joint pain)  - at last visit, I suggested fluvastatin XL 80 mg daily >> still mm cramps and knee instability - discussed referral to Lipid clinic for PCSK9 inh's >> refuses for now  2. Obesity class 2 - weight higher - We will  continue Invokana which should help with weight loss  Philemon Kingdom, MD PhD [facility]

## 2017-11-24 NOTE — Patient Instructions (Addendum)
Please continue: - Metformin 1000 mg 2x a day with meals - Januvia 100 mg before b'fast - Invokana 100 mg before b'fast  Please use: - Amaryl 1 mg before b'fast and 2 mg before a larger dinner  Please return in 3 months with your sugar log.

## 2017-12-14 ENCOUNTER — Encounter: Payer: Self-pay | Admitting: Family Medicine

## 2017-12-14 ENCOUNTER — Ambulatory Visit (INDEPENDENT_AMBULATORY_CARE_PROVIDER_SITE_OTHER): Payer: Medicare Other | Admitting: Family Medicine

## 2017-12-14 VITALS — BP 123/77 | HR 72 | Ht 63.0 in | Wt 192.0 lb

## 2017-12-14 DIAGNOSIS — M25511 Pain in right shoulder: Secondary | ICD-10-CM

## 2017-12-14 DIAGNOSIS — G8929 Other chronic pain: Secondary | ICD-10-CM

## 2017-12-14 NOTE — Patient Instructions (Signed)
You're doing great! Follow up with me as needed. 

## 2017-12-15 ENCOUNTER — Encounter: Payer: Self-pay | Admitting: Family Medicine

## 2017-12-15 NOTE — Progress Notes (Signed)
PCP and consultation requested by: Mosie Lukes, MD  Subjective:   HPI: Patient is a 67 y.o. female here for right shoulder pain.  4/23: Patient reports for about 1 year now she's had pain in anterior right shoulder. Pain is sharp at 7/10 level but up to 10/10 level at times. No numbness. Aches with any movement. + night pain. Tried tylenol, ice/heat, topical meication. Does get radiation into arm to ring finger with some numbness at times. No skin changes. Right handed.  5/23: Patient reports she has improved some since last visit. Pain level 5/10, sharp. Injection helped. Takes ibuprofen or aleve if needed. Pain worse lying on her right side. Bothers with lifting heavy items, mopping. Tingling is better and more infrequent. Doing home exercises. No skin changes.  7/3: Patient reports she's doing very well. Has stopped doing home exercises given her improvement. Takes tylenol if she has any soreness - no pain now. Soreness she gets is in upper arm area. No skin changes, numbness. Tingling resolved.  Past Medical History:  Diagnosis Date  . Acute bronchitis 10/31/2015  . Allergy    seasonal  . Arthritis    knee-left  . Cataract    right eye  . Depression    job/stress related  . Diabetes mellitus 52   type 2  . Diaphragmatic hernia without mention of obstruction or gangrene   . Endometriosis   . Esophageal reflux   . Esophageal stricture   . GERD (gastroesophageal reflux disease)   . Hyperlipidemia   . Hyperplastic colon polyp   . Hypertension 52  . Nonalcoholic fatty liver disease 04/28/2010   Qualifier: Diagnosis of  By: Arnoldo Morale MD, Balinda Quails   . Nonspecific abnormal results of liver function study   . Pharyngitis 02/18/2012  . Plantar fascial fibromatosis   . Preventative health care 01/22/2013  . Seasonal allergies 10/31/2015  . Sleep apnea    mouth piece per pt.  . Sore throat 04/03/2017  . Tubular adenoma of colon   . Unspecified sleep apnea     no c-pap    Current Outpatient Medications on File Prior to Visit  Medication Sig Dispense Refill  . ASPIR-LOW 81 MG EC tablet TAKE 1 TABLET BY MOUTH DAILY 30 tablet 3  . BAYER CONTOUR TEST test strip USE AS INSTRUCTED 100 each 1  . bisoprolol-hydrochlorothiazide (ZIAC) 5-6.25 MG tablet TAKE 1 TABLET BY MOUTH DAILY. 90 tablet 0  . dicyclomine (BENTYL) 10 MG capsule Take 1 capsule (10 mg total) by mouth 3 (three) times daily before meals. 90 capsule 11  . fluvastatin XL (LESCOL XL) 80 MG 24 hr tablet Take 1 tablet (80 mg total) by mouth daily. 30 tablet 11  . glimepiride (AMARYL) 1 MG tablet Take 1 mg in am and 2 mg before a larger dinner. 270 tablet 3  . INVOKANA 100 MG TABS tablet TAKE 1 TABLET BY MOUTH DAILY. 30 tablet 5  . JANUVIA 100 MG tablet TAKE 1 TABLET (100 MG TOTAL) BY MOUTH DAILY. 30 tablet 3  . KRILL OIL PO Take 1 tablet by mouth daily.     . metFORMIN (GLUCOPHAGE) 1000 MG tablet TAKE 1 TABLET BY MOUTH TWICE A DAY AND TAKE 1/2 TABLET AT NOON 75 tablet 1  . omeprazole (PRILOSEC) 40 MG capsule TAKE ONE CAPSULE BY MOUTH 2 TIMES A DAY 60 capsule 1  . Probiotic Product (PROBIOTIC DAILY PO) Take 1 capsule by mouth daily.    . rosuvastatin (CRESTOR) 5 MG tablet Take  1 tablet (5 mg total) by mouth every other day. 15 tablet 2   No current facility-administered medications on file prior to visit.     Past Surgical History:  Procedure Laterality Date  . ABDOMINAL HYSTERECTOMY  1982   partial  . APPENDECTOMY    . 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 BREAST CYST INCISION AND DRAINAGE     right breast  . CATARACT EXTRACTION     right eye  . HERNIA REPAIR     per pt, she is not aware of this surgery  . seba    . TUBAL LIGATION      Allergies  Allergen Reactions  . Pravastatin Other (See Comments)    Myalgia, fatigue, memory loss  . Amoxicillin Itching    REACTION: rash  . Atorvastatin Other (See Comments)    Myalgia, fatigue, memory loss  . Codeine Nausea And Vomiting     Social History   Socioeconomic History  . Marital status: Married    Spouse name: [name]  . Number of children: 2  . Years of education: Not on file  . Highest education level: Not on file  Occupational History  . Occupation: Building surveyor: [employer]  . Financial resource strain: Not on file  . Food insecurity:    Worry: Not on file    Inability: Not on file  . Transportation needs:    Medical: Not on file    Non-medical: Not on file  Tobacco Use  . Smoking status: Never Smoker  . Smokeless tobacco: Never Used  Substance and Sexual Activity  . Alcohol use: No    Alcohol/week: 0.0 oz  . Drug use: No  . Sexual activity: Yes    Comment: lives with husband, no dietary restrictions just watching carbs, wears seat belt  Lifestyle  . Physical activity:    Days per week: Not on file    Minutes per session: Not on file  . Stress: Not on file  Relationships  . Social connections:    Talks on phone: Not on file    Gets together: Not on file    Attends religious service: Not on file    Active member of club or organization: Not on file    Attends meetings of clubs or organizations: Not on file    Relationship status: Not on file  . Intimate partner violence:    Fear of current or ex partner: Not on file    Emotionally abused: Not on file    Physically abused: Not on file    Forced sexual activity: Not on file  Other Topics Concern  . Not on file  Social History Narrative  . Not on file    Family History  Problem Relation Age of Onset  . Breast cancer Mother   . Hypertension Mother   . Diabetes Mother        type 2  . Other Mother        esophagus surgery  . Heart disease Father   . Hyperlipidemia Father   . Hypertension Father   . Heart attack Father        MI at age 9  . Diabetes Brother        type 2  . Heart disease Maternal Grandmother   . Heart attack Maternal Grandfather   . Hypertension Maternal Grandfather   .  Stroke Paternal Grandmother   . Hypertension Sister   .  Diabetes Sister   . Hypertension Brother   . Alcohol abuse Maternal Aunt   . Colon cancer Neg Hx   . Stomach cancer Neg Hx   . Esophageal cancer Neg Hx     BP 123/77   Pulse 72   Ht 5\' 3"  (1.6 m)   Wt 192 lb (87.1 kg)   BMI 34.01 kg/m   Review of Systems: See HPI above.     Objective:  Physical Exam:  Gen: NAD, comfortable in exam room  Right shoulder: No swelling, ecchymoses.  No gross deformity. No TTP. FROM. Negative Hawkins, Neers. Negative Yergasons. Strength 5/5 with empty can and resisted internal/external rotation. Negative apprehension. NV intact distally.  Assessment & Plan:  1. Right shoulder pain - 2/2 rotator cuff impingement.  Resolved at this point.  Follow up with [place] as needed.

## 2017-12-15 NOTE — Assessment & Plan Note (Signed)
2/2 rotator cuff impingement.  Resolved at this point.  Follow up with [place] as needed.

## 2017-12-23 MED FILL — INVOKANA 100 MG TABLET: 100 | 30 days supply | Qty: 30 | Fill #4

## 2017-12-23 MED FILL — metFORMIN HCL 1000 MG TABS: 1000 | 30 days supply | Qty: 75 | Fill #1

## 2017-12-23 MED FILL — JANUVIA 100 MG TABLET: 100 | 30 days supply | Qty: 30 | Fill #2

## 2017-12-23 MED FILL — OMEPRAZOLE 40 MG CPDR: 40 | 30 days supply | Qty: 60 | Fill #1

## 2017-12-28 ENCOUNTER — Other Ambulatory Visit: Payer: Self-pay | Admitting: Family Medicine

## 2017-12-30 ENCOUNTER — Other Ambulatory Visit: Payer: Self-pay

## 2017-12-30 ENCOUNTER — Ambulatory Visit: Payer: Self-pay | Admitting: *Deleted

## 2017-12-30 ENCOUNTER — Emergency Department (HOSPITAL_BASED_OUTPATIENT_CLINIC_OR_DEPARTMENT_OTHER)
Admission: EM | Admit: 2017-12-30 | Discharge: 2017-12-30 | Disposition: A | Discharge status: Home / Self Care | Payer: Medicare Other | Attending: Emergency Medicine | Admitting: Emergency Medicine

## 2017-12-30 ENCOUNTER — Encounter (HOSPITAL_BASED_OUTPATIENT_CLINIC_OR_DEPARTMENT_OTHER): Payer: Self-pay | Admitting: Emergency Medicine

## 2017-12-30 DIAGNOSIS — F419 Anxiety disorder, unspecified: Secondary | ICD-10-CM | POA: Diagnosis not present

## 2017-12-30 DIAGNOSIS — Z79899 Other long term (current) drug therapy: Secondary | ICD-10-CM | POA: Diagnosis not present

## 2017-12-30 DIAGNOSIS — R110 Nausea: Secondary | ICD-10-CM | POA: Diagnosis not present

## 2017-12-30 DIAGNOSIS — F329 Major depressive disorder, single episode, unspecified: Secondary | ICD-10-CM | POA: Diagnosis not present

## 2017-12-30 DIAGNOSIS — E119 Type 2 diabetes mellitus without complications: Secondary | ICD-10-CM | POA: Diagnosis not present

## 2017-12-30 DIAGNOSIS — Z7982 Long term (current) use of aspirin: Secondary | ICD-10-CM | POA: Insufficient documentation

## 2017-12-30 DIAGNOSIS — R42 Dizziness and giddiness: Secondary | ICD-10-CM | POA: Diagnosis not present

## 2017-12-30 DIAGNOSIS — I10 Essential (primary) hypertension: Secondary | ICD-10-CM | POA: Insufficient documentation

## 2017-12-30 DIAGNOSIS — Z7984 Long term (current) use of oral hypoglycemic drugs: Secondary | ICD-10-CM | POA: Diagnosis not present

## 2017-12-30 LAB — COMPREHENSIVE METABOLIC PANEL
ALT: 47 U/L — AB (ref 0–44)
AST: 33 U/L (ref 15–41)
Albumin: 3.7 g/dL (ref 3.5–5.0)
Alkaline Phosphatase: 74 U/L (ref 38–126)
Anion gap: 10 (ref 5–15)
BILIRUBIN TOTAL: 0.5 mg/dL (ref 0.3–1.2)
BUN: 17 mg/dL (ref 8–23)
CHLORIDE: 106 mmol/L (ref 98–111)
CO2: 25 mmol/L (ref 22–32)
CREATININE: 0.91 mg/dL (ref 0.44–1.00)
Calcium: 9.2 mg/dL (ref 8.9–10.3)
Glucose, Bld: 159 mg/dL — ABNORMAL HIGH (ref 70–99)
Potassium: 4 mmol/L (ref 3.5–5.1)
Sodium: 141 mmol/L (ref 135–145)
TOTAL PROTEIN: 7.6 g/dL (ref 6.5–8.1)

## 2017-12-30 LAB — CBC WITH DIFFERENTIAL/PLATELET
BASOS ABS: 0 10*3/uL (ref 0.0–0.1)
Basophils Relative: 1 %
EOS PCT: 1 %
Eosinophils Absolute: 0.1 10*3/uL (ref 0.0–0.7)
HEMATOCRIT: 40.5 % (ref 36.0–46.0)
Hemoglobin: 13.2 g/dL (ref 12.0–15.0)
LYMPHS ABS: 1.5 10*3/uL (ref 0.7–4.0)
Lymphocytes Relative: 23 %
MCH: 25.7 pg — AB (ref 26.0–34.0)
MCHC: 32.6 g/dL (ref 30.0–36.0)
MCV: 78.8 fL (ref 78.0–100.0)
MONO ABS: 0.4 10*3/uL (ref 0.1–1.0)
Monocytes Relative: 7 %
Neutro Abs: 4.6 10*3/uL (ref 1.7–7.7)
Neutrophils Relative %: 68 %
PLATELETS: 225 10*3/uL (ref 150–400)
RBC: 5.14 MIL/uL — ABNORMAL HIGH (ref 3.87–5.11)
RDW: 14.5 % (ref 11.5–15.5)
WBC: 6.7 10*3/uL (ref 4.0–10.5)

## 2017-12-30 LAB — CBG MONITORING, ED: GLUCOSE-CAPILLARY: 159 mg/dL — AB (ref 70–99)

## 2017-12-30 LAB — LIPASE, BLOOD: LIPASE: 62 U/L — AB (ref 11–51)

## 2017-12-30 MED ORDER — MECLIZINE HCL 25 MG PO TABS: 25.0000 mg | ORAL_TABLET | Freq: Three times a day (TID) | ORAL | 0 refills | Status: DC | PRN

## 2017-12-30 MED ORDER — ONDANSETRON 4 MG PO TBDP
4.0000 mg | ORAL_TABLET | Freq: Once | ORAL | Status: AC
  Administered 2017-12-30: 4 mg via ORAL
  Filled 2017-12-30: qty 1

## 2017-12-30 MED ORDER — MECLIZINE HCL 25 MG PO TABS
25.0000 mg | ORAL_TABLET | Freq: Once | ORAL | Status: AC
  Administered 2017-12-30: 25 mg via ORAL
  Filled 2017-12-30: qty 1

## 2017-12-30 MED FILL — MECLIZINE 25 MG TABLET: 25 | 10 days supply | Qty: 30 | Fill #0

## 2017-12-30 NOTE — ED Provider Notes (Signed)
[facility] EMERGENCY DEPARTMENT Provider Note   CSN: [id number] Arrival date & time: 12/30/17  0855     History   Chief Complaint Chief Complaint  Patient presents with  . Dizziness    HPI [name] is a 67 y.o. female.  Pt is a 67y/o female with hx of DM, HTN, HLD, esophageal stricture and reflux presenting today with a complaint of dizziness that she describes as the room spinning around her.  This started this morning around 5:00 when she was going to get up to go to the bathroom.  She states at home the symptoms were severe to where even sitting up she felt like she was moving in circles.  She felt extremely nauseated with this and had fullness in her epigastric region but did not vomit.  She had a normal bowel movement this morning but no diarrhea.  She has noticed some congestion over the last several days but denies any true cold-like symptoms.  She has no headache or new neck pain.  She denies any shortness of breath.  She states she has had some symptoms like this in the past but they have never been this pronounced.  She denies any unilateral numbness or weakness.  Daughter states when she goes to walk she was very unsteady.  Patient was able to walk back to the room but was noted to be a little off balance by the time she arrived to the room.  The history is provided by the patient.  Dizziness  Quality:  Head spinning and room spinning Severity:  Severe Onset quality:  Sudden Duration:  2 days Timing:  Constant Progression:  Partially resolved Chronicity:  Recurrent Context comment:  Started when getting out of bed to go to the bathroom Relieved by:  Being still Worsened by:  Standing up and turning head (trying to walk) Ineffective treatments:  Being still Associated symptoms: nausea   Associated symptoms: no chest pain, no diarrhea, no headaches, no shortness of breath, no syncope, no vision changes, no vomiting and no weakness   Associated symptoms  comment:  Some epigastric fullness   Past Medical History:  Diagnosis Date  . Acute bronchitis 10/31/2015  . Allergy    seasonal  . Arthritis    knee-left  . Cataract    right eye  . Depression    job/stress related  . Diabetes mellitus 52   type 2  . Diaphragmatic hernia without mention of obstruction or gangrene   . Endometriosis   . Esophageal reflux   . Esophageal stricture   . GERD (gastroesophageal reflux disease)   . Hyperlipidemia   . Hyperplastic colon polyp   . Hypertension 52  . Nonalcoholic fatty liver disease 04/28/2010   Qualifier: Diagnosis of  By: Arnoldo Morale MD, Balinda Quails   . Nonspecific abnormal results of liver function study   . Pharyngitis 02/18/2012  . Plantar fascial fibromatosis   . Preventative health care 01/22/2013  . Seasonal allergies 10/31/2015  . Sleep apnea    mouth piece per pt.  . Sore throat 04/03/2017  . Tubular adenoma of colon   . Unspecified sleep apnea    no c-pap    Patient Active Problem List   Diagnosis Date Noted  . Bilateral calf pain 10/03/2017  . Right shoulder pain 10/03/2017  . Headache 06/30/2017  . Obesity (BMI 30-39.9) 05/02/2017  . Seasonal allergies 10/31/2015  . Hx of adenomatous colonic polyps 04/30/2014  . Cervical cancer screening 01/22/2013  . Preventative health  care 01/22/2013  . Chest pain 01/06/2012  . Endometriosis   . Chronic cough 08/31/2011  . Nonalcoholic fatty liver disease 04/28/2010  . OSA (obstructive sleep apnea) 03/26/2009  . RT BUNDLE BRANCH BLOCK&LT ANT FASCICULAR BLOCK 12/20/2008  . Thoracic back pain 10/10/2008  . OSTEOARTHRITIS, MODERATE 07/05/2008  . Abdominal pain 07/05/2008  . Aneurysm, thoracoabdominal ([place]) 03/22/2007  . Type 2 diabetes mellitus with hyperglycemia, without long-term current use of insulin ([place]) 10/28/2006  . Hyperlipidemia, mixed 10/28/2006  . Depression with anxiety 10/28/2006  . Essential hypertension 10/28/2006  . ESOPHAGEAL STRICTURE 05/04/2004  . HIATAL HERNIA  05/04/2004    Past Surgical History:  Procedure Laterality Date  . ABDOMINAL HYSTERECTOMY  1982   partial  . APPENDECTOMY    . 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 BREAST CYST INCISION AND DRAINAGE     right breast  . CATARACT EXTRACTION     right eye  . HERNIA REPAIR     per pt, she is not aware of this surgery  . seba    . TUBAL LIGATION       OB History   None      Home Medications    Prior to Admission medications   Medication Sig Start Date End Date Taking? Authorizing Provider  ASPIR-LOW 81 MG EC tablet TAKE 1 TABLET BY MOUTH DAILY 01/16/15   Mosie Lukes, MD  bisoprolol-hydrochlorothiazide (ZIAC) 5-6.25 MG tablet TAKE 1 TABLET BY MOUTH DAILY. 10/11/17   Mosie Lukes, MD  CONTOUR NEXT TEST test strip USE TO CHECK BLOOD SUGAR DAILY AS NEEDED 12/29/17   Mosie Lukes, MD  dicyclomine (BENTYL) 10 MG capsule Take 1 capsule (10 mg total) by mouth 3 (three) times daily before meals. 07/21/17   Ladene Artist, MD  fluvastatin XL (LESCOL XL) 80 MG 24 hr tablet Take 1 tablet (80 mg total) by mouth daily. 08/24/17   Philemon Kingdom, MD  glimepiride (AMARYL) 1 MG tablet Take 1 mg in am and 2 mg before a larger dinner. 11/24/17   Philemon Kingdom, MD  INVOKANA 100 MG TABS tablet TAKE 1 TABLET BY MOUTH DAILY. 08/19/17   Philemon Kingdom, MD  JANUVIA 100 MG tablet TAKE 1 TABLET (100 MG TOTAL) BY MOUTH DAILY. 10/21/17   Mosie Lukes, MD  KRILL OIL PO Take 1 tablet by mouth daily.     [provider]  metFORMIN (GLUCOPHAGE) 1000 MG tablet TAKE 1 TABLET BY MOUTH TWICE A DAY AND TAKE 1/2 TABLET AT NOON 11/21/17   Mosie Lukes, MD  omeprazole (PRILOSEC) 40 MG capsule TAKE ONE CAPSULE BY MOUTH 2 TIMES A DAY 11/21/17   Mosie Lukes, MD  Probiotic Product (PROBIOTIC DAILY PO) Take 1 capsule by mouth daily.    [provider]  rosuvastatin (CRESTOR) 5 MG tablet Take 1 tablet (5 mg total) by mouth every other day. 04/01/17   Mosie Lukes, MD    Family  History Family History  Problem Relation Age of Onset  . Breast cancer Mother   . Hypertension Mother   . Diabetes Mother        type 2  . Other Mother        esophagus surgery  . Heart disease Father   . Hyperlipidemia Father   . Hypertension Father   . Heart attack Father        MI at age 22  . Diabetes Brother  type 2  . Heart disease Maternal Grandmother   . Heart attack Maternal Grandfather   . Hypertension Maternal Grandfather   . Stroke Paternal Grandmother   . Hypertension Sister   . Diabetes Sister   . Hypertension Brother   . Alcohol abuse Maternal Aunt   . Colon cancer Neg Hx   . Stomach cancer Neg Hx   . Esophageal cancer Neg Hx     Social History Social History   Tobacco Use  . Smoking status: Never Smoker  . Smokeless tobacco: Never Used  Substance Use Topics  . Alcohol use: No    Alcohol/week: 0.0 oz  . Drug use: No     Allergies   Pravastatin; Amoxicillin; Atorvastatin; and Codeine   Review of Systems Review of Systems  Constitutional: Negative for diaphoresis and fever.  Eyes: Negative for visual disturbance.  Respiratory: Negative for shortness of breath.   Cardiovascular: Negative for chest pain and syncope.  Gastrointestinal: Positive for nausea. Negative for diarrhea and vomiting.  Neurological: Positive for dizziness. Negative for weakness and headaches.  All other systems reviewed and are negative.    Physical Exam Updated Vital Signs BP 131/78   Pulse 73   Temp 97.7 F (36.5 C)   Resp 17   Ht 5\' 3"  (1.6 m)   Wt 87.1 kg (192 lb)   SpO2 96%   BMI 34.01 kg/m   Physical Exam  Constitutional: She is oriented to person, place, and time. She appears well-developed and well-nourished. No distress.  HENT:  Head: Normocephalic and atraumatic.  Mouth/Throat: Oropharynx is clear and moist.  Eyes: Pupils are equal, round, and reactive to light. Conjunctivae and EOM are normal.  Neck: Normal range of motion. Neck supple.   Cardiovascular: Normal rate, regular rhythm and intact distal pulses.  No murmur heard. Pulmonary/Chest: Effort normal and breath sounds normal. No respiratory distress. She has no wheezes. She has no rales.  Abdominal: Soft. She exhibits no distension. There is no tenderness. There is no rebound and no guarding.  Musculoskeletal: Normal range of motion. She exhibits no edema or tenderness.  Neurological: She is alert and oriented to person, place, and time.  No pronator drift, 5 out of 5 strength in bilateral upper and lower extremities.  Normal heel-to-shin bilaterally.  No dysdiadochokinesis.  Speech is normal.  No facial droop is noted.  Skin: Skin is warm and dry. No rash noted. No erythema.  Psychiatric: She has a normal mood and affect. Her behavior is normal.  Nursing note and vitals reviewed.    ED Treatments / Results  Labs (all labs ordered are listed, but only abnormal results are displayed) Labs Reviewed  CBC WITH DIFFERENTIAL/PLATELET - Abnormal; Notable for the following components:      Result Value   RBC 5.14 (*)    MCH 25.7 (*)    All other components within normal limits  COMPREHENSIVE METABOLIC PANEL - Abnormal; Notable for the following components:   Glucose, Bld 159 (*)    ALT 47 (*)    All other components within normal limits  LIPASE, BLOOD - Abnormal; Notable for the following components:   Lipase 62 (*)    All other components within normal limits  CBG MONITORING, ED - Abnormal; Notable for the following components:   Glucose-Capillary 159 (*)    All other components within normal limits    EKG EKG Interpretation  Date/Time:  Friday December 30 2017 09:22:49 EDT Ventricular Rate:  72 PR Interval:  QRS Duration: 138 QT Interval:  455 QTC Calculation: 498 R Axis:   -87 Text Interpretation:  Sinus rhythm RBBB and LAFB No significant change since last tracing Confirmed by Blanchie Dessert [id number]) on 12/30/2017 9:31:58 AM   Radiology No results  found.  Procedures Procedures (including critical care time)  Medications Ordered in ED Medications  ondansetron (ZOFRAN-ODT) disintegrating tablet 4 mg (4 mg Oral Given 12/30/17 0955)  meclizine (ANTIVERT) tablet 25 mg (25 mg Oral Given 12/30/17 0955)     Initial Impression / Assessment and Plan / ED Course  I have reviewed the triage vital signs and the nursing notes.  Pertinent labs & imaging results that were available during my care of the patient were reviewed by me and considered in my medical decision making (see chart for details).     Pt with sx most consistent with peripheral vertigo.  No systemic or infectious sx.  Normal neuro exam without weakness, ataxia or cerebellar findings on exam.  Normal vision.  Sx are reproducible with movement of the head and attempting to walk.  No hx of Stroke and low likelihood.  Normal VS. patient's blood sugar today is 159.  Low suspicion for acute abdominal pathology today.  EKG is unchanged.  Labs are otherwise unremarkable.  Patient initially was a little off balance by the time she arrived to the room but was never frankly ataxic. Will treat for peripheral vertigo and re-eval.  11:43 AM After treatment with meclizine and Zofran patient states she feels significantly better.  She was able to get up and walk with only minimal dizziness.  She had no evidence of ataxia.  Discussed with the patient this is most likely peripheral in nature and she was given a prescription for meclizine but told if symptoms do not improve within 1 week she needs to follow-up with her doctor for potentially further testing.  Patient and her family understand what has been done and are agreeable with this plan.  They will return if symptoms worsen.  Final Clinical Impressions(s) / ED Diagnoses   Final diagnoses:  Vertigo    ED Discharge Orders        Ordered    meclizine (ANTIVERT) 25 MG tablet  3 times daily PRN     12/30/17 1102       Blanchie Dessert,  MD 12/30/17 1144

## 2017-12-30 NOTE — Telephone Encounter (Signed)
Pt reports severe abdominal pain, onset this am; mid stomach, shoots to back at times. Also reports severe vertigo, intermittent but frequent intervals,onset this am with abdominal pain, nausea. Pt states she is calling from outside ED at [facility] . "Just wanted to check in with office."  Instructed pt to stay at ED for evaluation. Daughter is with patient.   Reason for Disposition . [1] SEVERE pain AND [2] age > 75  Answer Assessment - Initial Assessment Questions 1. LOCATION: "Where does it hurt?"      Top of stomach 2. RADIATION: "Does the pain shoot anywhere else?" (e.g., chest, back)     To back at times 3. ONSET: "When did the pain begin?" (e.g., minutes, hours or days ago)      This am 4. SUDDEN: "Gradual or sudden onset?"     sudden 5. PATTERN "Does the pain come and go, or is it constant?"    - If constant: "Is it getting better, staying the same, or worsening?"      (Note: Constant means the pain never goes away completely; most serious pain is constant and it progresses)     - If intermittent: "How long does it last?" "Do you have pain now?"     (Note: Intermittent means the pain goes away completely between bouts)     constant 6. SEVERITY: "How bad is the pain?"  (e.g., Scale 1-10; mild, moderate, or severe)   - MILD (1-3): doesn't interfere with normal activities, abdomen soft and not tender to touch    - MODERATE (4-7): interferes with normal activities or awakens from sleep, tender to touch    - SEVERE (8-10): excruciating pain, doubled over, unable to do any normal activities      This am 8-9/10 7. RECURRENT SYMPTOM: "Have you ever had this type of abdominal pain before?" If so, ask: "When was the last time?" and "What happened that time?"       8. CAUSE: "What do you think is causing the abdominal pain?"      9. RELIEVING/AGGRAVATING FACTORS: "What makes it better or worse?" (e.g., movement, antacids, bowel movement)      10. OTHER SYMPTOMS: "Has there been any  vomiting, diarrhea, constipation, or urine problems?"       Nausea, vertigo  Protocols used: ABDOMINAL PAIN - [facility]

## 2017-12-30 NOTE — ED Triage Notes (Signed)
Reports woke up with dizziness this morning.  Reports abdominal pain with nausea.  States dizziness has since become somewhat better. Ambulatory to room in NAD.

## 2018-01-11 ENCOUNTER — Other Ambulatory Visit: Payer: Self-pay | Admitting: Family Medicine

## 2018-01-11 MED FILL — BISOPROLOL-HCTZ 5-6.25 MG T: 5-6.25 | 90 days supply | Qty: 90 | Fill #0

## 2018-01-26 ENCOUNTER — Other Ambulatory Visit: Payer: Self-pay | Admitting: Family Medicine

## 2018-01-26 MED FILL — JANUVIA 100 MG TABLET: 100 | 30 days supply | Qty: 30 | Fill #3

## 2018-01-26 MED FILL — INVOKANA 100 MG TABLET: 100 | 30 days supply | Qty: 30 | Fill #5

## 2018-01-26 MED FILL — OMEPRAZOLE 40 MG CPDR: 40 | 30 days supply | Qty: 60 | Fill #0

## 2018-02-06 ENCOUNTER — Encounter: Payer: Medicare Other | Admitting: Family Medicine

## 2018-02-17 ENCOUNTER — Other Ambulatory Visit: Payer: Self-pay | Admitting: Family Medicine

## 2018-02-17 MED FILL — metFORMIN HCL 1000 MG TABS: 1000 | 30 days supply | Qty: 75 | Fill #0

## 2018-02-20 DIAGNOSIS — Z1231 Encounter for screening mammogram for malignant neoplasm of breast: Secondary | ICD-10-CM | POA: Diagnosis not present

## 2018-02-24 ENCOUNTER — Telehealth: Payer: Self-pay | Admitting: *Deleted

## 2018-02-24 ENCOUNTER — Ambulatory Visit: Payer: Medicare Other | Admitting: Internal Medicine

## 2018-02-24 NOTE — Telephone Encounter (Signed)
Received Mammogram results from [place], Spot magnification and lateral views are recommended; forwarded to provider/SLS 09/13

## 2018-02-24 NOTE — Telephone Encounter (Signed)
Received Physician Orders from [place]; forwarded to provider/SLS 09/13

## 2018-02-27 ENCOUNTER — Other Ambulatory Visit: Payer: Self-pay | Admitting: Family Medicine

## 2018-02-27 ENCOUNTER — Other Ambulatory Visit: Payer: Self-pay | Admitting: Internal Medicine

## 2018-02-27 MED FILL — INVOKANA 100 MG TABLET: 100 | 30 days supply | Qty: 30 | Fill #0

## 2018-02-27 MED FILL — OMEPRAZOLE 40 MG CPDR: 40 | 30 days supply | Qty: 60 | Fill #1

## 2018-02-27 MED FILL — GLIMEPIRIDE 1 MG TABLET: 1 | 90 days supply | Qty: 270 | Fill #1

## 2018-03-03 ENCOUNTER — Telehealth: Payer: Self-pay | Admitting: *Deleted

## 2018-03-03 DIAGNOSIS — R922 Inconclusive mammogram: Secondary | ICD-10-CM | POA: Diagnosis not present

## 2018-03-03 LAB — HM MAMMOGRAPHY

## 2018-03-03 NOTE — Telephone Encounter (Signed)
Received Physician Orders from [place]; forwarded to provider/SLS 09/20

## 2018-03-06 ENCOUNTER — Telehealth: Payer: Self-pay | Admitting: *Deleted

## 2018-03-06 NOTE — Telephone Encounter (Signed)
Received Mammogram results from [place]; forwarded to provider/SLS 09/23

## 2018-03-07 ENCOUNTER — Other Ambulatory Visit: Payer: Self-pay | Admitting: Radiology

## 2018-03-07 DIAGNOSIS — Z01818 Encounter for other preprocedural examination: Secondary | ICD-10-CM | POA: Diagnosis not present

## 2018-03-07 DIAGNOSIS — C50811 Malignant neoplasm of overlapping sites of right female breast: Secondary | ICD-10-CM | POA: Diagnosis not present

## 2018-03-07 DIAGNOSIS — D0591 Unspecified type of carcinoma in situ of right breast: Secondary | ICD-10-CM | POA: Diagnosis not present

## 2018-03-07 LAB — HM MAMMOGRAPHY

## 2018-03-08 ENCOUNTER — Encounter (HOSPITAL_BASED_OUTPATIENT_CLINIC_OR_DEPARTMENT_OTHER): Payer: Self-pay | Admitting: *Deleted

## 2018-03-08 ENCOUNTER — Emergency Department (HOSPITAL_BASED_OUTPATIENT_CLINIC_OR_DEPARTMENT_OTHER)
Admission: EM | Admit: 2018-03-08 | Discharge: 2018-03-08 | Disposition: A | Discharge status: Home / Self Care | Payer: Medicare Other | Attending: Emergency Medicine | Admitting: Emergency Medicine

## 2018-03-08 ENCOUNTER — Emergency Department (HOSPITAL_BASED_OUTPATIENT_CLINIC_OR_DEPARTMENT_OTHER): Payer: Medicare Other

## 2018-03-08 ENCOUNTER — Other Ambulatory Visit: Payer: Self-pay

## 2018-03-08 DIAGNOSIS — Z7982 Long term (current) use of aspirin: Secondary | ICD-10-CM | POA: Diagnosis not present

## 2018-03-08 DIAGNOSIS — Z7984 Long term (current) use of oral hypoglycemic drugs: Secondary | ICD-10-CM | POA: Insufficient documentation

## 2018-03-08 DIAGNOSIS — E119 Type 2 diabetes mellitus without complications: Secondary | ICD-10-CM | POA: Insufficient documentation

## 2018-03-08 DIAGNOSIS — R42 Dizziness and giddiness: Secondary | ICD-10-CM

## 2018-03-08 DIAGNOSIS — I10 Essential (primary) hypertension: Secondary | ICD-10-CM | POA: Insufficient documentation

## 2018-03-08 DIAGNOSIS — R110 Nausea: Secondary | ICD-10-CM | POA: Diagnosis not present

## 2018-03-08 LAB — CBC
HCT: 41.3 % (ref 36.0–46.0)
Hemoglobin: 13.8 g/dL (ref 12.0–15.0)
MCH: 26.1 pg (ref 26.0–34.0)
MCHC: 33.4 g/dL (ref 30.0–36.0)
MCV: 78.2 fL (ref 78.0–100.0)
PLATELETS: 223 10*3/uL (ref 150–400)
RBC: 5.28 MIL/uL — AB (ref 3.87–5.11)
RDW: 14.4 % (ref 11.5–15.5)
WBC: 5.9 10*3/uL (ref 4.0–10.5)

## 2018-03-08 LAB — BASIC METABOLIC PANEL
Anion gap: 13 (ref 5–15)
BUN: 15 mg/dL (ref 8–23)
CALCIUM: 9.2 mg/dL (ref 8.9–10.3)
CO2: 23 mmol/L (ref 22–32)
Chloride: 105 mmol/L (ref 98–111)
Creatinine, Ser: 0.93 mg/dL (ref 0.44–1.00)
GFR calc Af Amer: >60 mL/min (ref >60)
GLUCOSE: 189 mg/dL — AB (ref 70–99)
POTASSIUM: 3.7 mmol/L (ref 3.5–5.1)
SODIUM: 141 mmol/L (ref 135–145)

## 2018-03-08 LAB — CBG MONITORING, ED: GLUCOSE-CAPILLARY: 172 mg/dL — AB (ref 70–99)

## 2018-03-08 MED ORDER — LORAZEPAM 2 MG/ML IJ SOLN
1.0000 mg | Freq: Once | INTRAMUSCULAR | Status: AC
  Administered 2018-03-08: 1 mg via INTRAVENOUS
  Filled 2018-03-08: qty 1

## 2018-03-08 MED ORDER — SODIUM CHLORIDE 0.9 % IV BOLUS
1000.0000 mL | Freq: Once | INTRAVENOUS | Status: AC
  Administered 2018-03-08: 1000 mL via INTRAVENOUS

## 2018-03-08 MED ORDER — MECLIZINE HCL 25 MG PO TABS: 25.0000 mg | ORAL_TABLET | Freq: Three times a day (TID) | ORAL | 0 refills | Status: DC | PRN

## 2018-03-08 MED ORDER — ONDANSETRON 8 MG PO TBDP: 8.0000 mg | ORAL_TABLET | Freq: Three times a day (TID) | ORAL | 0 refills | Status: DC | PRN

## 2018-03-08 MED ORDER — DIAZEPAM 5 MG PO TABS: 5.0000 mg | ORAL_TABLET | Freq: Three times a day (TID) | ORAL | 0 refills | Status: DC | PRN

## 2018-03-08 MED ORDER — ONDANSETRON HCL 4 MG/2ML IJ SOLN
4.0000 mg | Freq: Once | INTRAMUSCULAR | Status: AC
  Administered 2018-03-08: 4 mg via INTRAVENOUS
  Filled 2018-03-08: qty 2

## 2018-03-08 MED FILL — MECLIZINE 25 MG TABLET: 25 | 5 days supply | Qty: 15 | Fill #0

## 2018-03-08 MED FILL — ONDANSETRON ODT 8 MG TABLET: 8 | 3 days supply | Qty: 10 | Fill #0

## 2018-03-08 MED FILL — diazePAM 5 MG TABS: 5 | 4 days supply | Qty: 12 | Fill #0

## 2018-03-08 NOTE — ED Provider Notes (Signed)
[facility] EMERGENCY DEPARTMENT Provider Note   CSN: [id number] Arrival date & time: 03/08/18  1030     History   Chief Complaint Chief Complaint  Patient presents with  . Dizziness    HPI [name] is a 67 y.o. female.  HPI Patient is a 67 year old female who presents the emergency department with sudden onset room spinning sensation.  This began at approximately 7 AM.  Worse with head movement.  Began having nausea vomiting.  She has had episodic vertigo before.  She states this feels similar.  Denies headache.  Denies new weakness of her arms or legs.  Continues to feel uncomfortable and vomit in the emergency department.  Symptoms are moderate in severity   Past Medical History:  Diagnosis Date  . Acute bronchitis 10/31/2015  . Allergy    seasonal  . Arthritis    knee-left  . Cataract    right eye  . Depression    job/stress related  . Diabetes mellitus 52   type 2  . Diaphragmatic hernia without mention of obstruction or gangrene   . Endometriosis   . Esophageal reflux   . Esophageal stricture   . GERD (gastroesophageal reflux disease)   . Hyperlipidemia   . Hyperplastic colon polyp   . Hypertension 52  . Nonalcoholic fatty liver disease 04/28/2010   Qualifier: Diagnosis of  By: Arnoldo Morale MD, Balinda Quails   . Nonspecific abnormal results of liver function study   . Pharyngitis 02/18/2012  . Plantar fascial fibromatosis   . Preventative health care 01/22/2013  . Seasonal allergies 10/31/2015  . Sleep apnea    mouth piece per pt.  . Sore throat 04/03/2017  . Tubular adenoma of colon   . Unspecified sleep apnea    no c-pap    Patient Active Problem List   Diagnosis Date Noted  . Bilateral calf pain 10/03/2017  . Right shoulder pain 10/03/2017  . Headache 06/30/2017  . Obesity (BMI 30-39.9) 05/02/2017  . Seasonal allergies 10/31/2015  . Hx of adenomatous colonic polyps 04/30/2014  . Cervical cancer screening 01/22/2013  . Preventative health  care 01/22/2013  . Chest pain 01/06/2012  . Endometriosis   . Chronic cough 08/31/2011  . Nonalcoholic fatty liver disease 04/28/2010  . OSA (obstructive sleep apnea) 03/26/2009  . RT BUNDLE BRANCH BLOCK&LT ANT FASCICULAR BLOCK 12/20/2008  . Thoracic back pain 10/10/2008  . OSTEOARTHRITIS, MODERATE 07/05/2008  . Abdominal pain 07/05/2008  . Aneurysm, thoracoabdominal ([place]) 03/22/2007  . Type 2 diabetes mellitus with hyperglycemia, without long-term current use of insulin ([place]) 10/28/2006  . Hyperlipidemia, mixed 10/28/2006  . Depression with anxiety 10/28/2006  . Essential hypertension 10/28/2006  . ESOPHAGEAL STRICTURE 05/04/2004  . HIATAL HERNIA 05/04/2004    Past Surgical History:  Procedure Laterality Date  . ABDOMINAL HYSTERECTOMY  1982   partial  . APPENDECTOMY    . 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 BREAST CYST INCISION AND DRAINAGE     right breast  . CATARACT EXTRACTION     right eye  . HERNIA REPAIR     per pt, she is not aware of this surgery  . seba    . TUBAL LIGATION       OB History   None      Home Medications    Prior to Admission medications   Medication Sig Start Date End Date Taking? Authorizing Provider  ASPIR-LOW 81 MG EC tablet TAKE 1 TABLET BY MOUTH DAILY 01/16/15  Mosie Lukes, MD  bisoprolol-hydrochlorothiazide (ZIAC) 5-6.25 MG tablet TAKE 1 TABLET BY MOUTH DAILY. 01/11/18   Mosie Lukes, MD  CONTOUR NEXT TEST test strip USE TO CHECK BLOOD SUGAR DAILY AS NEEDED 12/29/17   Mosie Lukes, MD  diazepam (VALIUM) 5 MG tablet Take 1 tablet (5 mg total) by mouth every 8 (eight) hours as needed (dizziness). 03/08/18   Jola Schmidt, MD  dicyclomine (BENTYL) 10 MG capsule Take 1 capsule (10 mg total) by mouth 3 (three) times daily before meals. 07/21/17   Ladene Artist, MD  fluvastatin XL (LESCOL XL) 80 MG 24 hr tablet Take 1 tablet (80 mg total) by mouth daily. 08/24/17   Philemon Kingdom, MD  glimepiride (AMARYL) 1 MG tablet Take 1 mg in  am and 2 mg before a larger dinner. 11/24/17   Philemon Kingdom, MD  INVOKANA 100 MG TABS tablet TAKE 1 TABLET BY MOUTH DAILY. 02/27/18   Philemon Kingdom, MD  JANUVIA 100 MG tablet TAKE 1 TABLET (100 MG TOTAL) BY MOUTH DAILY. 02/27/18   Mosie Lukes, MD  KRILL OIL PO Take 1 tablet by mouth daily.     [provider]  meclizine (ANTIVERT) 25 MG tablet Take 1 tablet (25 mg total) by mouth 3 (three) times daily as needed for dizziness. 03/08/18   Jola Schmidt, MD  metFORMIN (GLUCOPHAGE) 1000 MG tablet TAKE 1 TABLET BY MOUTH TWICE A DAY AND TAKE 1/2 TABLET AT NOON 02/17/18   Mosie Lukes, MD  omeprazole (PRILOSEC) 40 MG capsule TAKE ONE CAPSULE BY MOUTH 2 TIMES A DAY 01/26/18   Mosie Lukes, MD  ondansetron (ZOFRAN ODT) 8 MG disintegrating tablet Take 1 tablet (8 mg total) by mouth every 8 (eight) hours as needed for nausea or vomiting. 03/08/18   Jola Schmidt, MD  Probiotic Product (PROBIOTIC DAILY PO) Take 1 capsule by mouth daily.    [provider]  rosuvastatin (CRESTOR) 5 MG tablet Take 1 tablet (5 mg total) by mouth every other day. 04/01/17   Mosie Lukes, MD    Family History Family History  Problem Relation Age of Onset  . Breast cancer Mother   . Hypertension Mother   . Diabetes Mother        type 2  . Other Mother        esophagus surgery  . Heart disease Father   . Hyperlipidemia Father   . Hypertension Father   . Heart attack Father        MI at age 33  . Diabetes Brother        type 2  . Heart disease Maternal Grandmother   . Heart attack Maternal Grandfather   . Hypertension Maternal Grandfather   . Stroke Paternal Grandmother   . Hypertension Sister   . Diabetes Sister   . Hypertension Brother   . Alcohol abuse Maternal Aunt   . Colon cancer Neg Hx   . Stomach cancer Neg Hx   . Esophageal cancer Neg Hx     Social History Social History   Tobacco Use  . Smoking status: Never Smoker  . Smokeless tobacco: Never Used  Substance Use  Topics  . Alcohol use: No    Alcohol/week: 0.0 standard drinks  . Drug use: No     Allergies   Pravastatin; Amoxicillin; Atorvastatin; and Codeine   Review of Systems Review of Systems  All other systems reviewed and are negative.    Physical Exam Updated Vital Signs  BP 126/63 (BP Location: Right Arm)   Pulse 88   Temp 97.8 F (36.6 C) (Oral)   Resp 16   Ht 5\' 3"  (1.6 m)   Wt 87.5 kg   SpO2 94%   BMI 34.19 kg/m   Physical Exam  Constitutional: She is oriented to person, place, and time. She appears well-developed and well-nourished. No distress.  HENT:  Head: Normocephalic and atraumatic.  Eyes: Pupils are equal, round, and reactive to light. EOM are normal.  Neck: Normal range of motion.  Cardiovascular: Normal rate, regular rhythm and normal heart sounds.  Pulmonary/Chest: Effort normal and breath sounds normal.  Abdominal: Soft. She exhibits no distension. There is no tenderness.  Musculoskeletal: Normal range of motion.  Neurological: She is alert and oriented to person, place, and time.  5/5 strength in major muscle groups of  bilateral upper and lower extremities. Speech normal. No facial asymetry.   Skin: Skin is warm and dry.  Psychiatric: She has a normal mood and affect. Judgment normal.  Nursing note and vitals reviewed.    ED Treatments / Results  Labs (all labs ordered are listed, but only abnormal results are displayed) Labs Reviewed  CBC - Abnormal; Notable for the following components:      Result Value   RBC 5.28 (*)    All other components within normal limits  BASIC METABOLIC PANEL - Abnormal; Notable for the following components:   Glucose, Bld 189 (*)    All other components within normal limits  CBG MONITORING, ED    EKG None  Radiology Ct Head Wo Contrast  Result Date: 03/08/2018 CLINICAL DATA:  Vertigo, nausea EXAM: CT HEAD WITHOUT CONTRAST TECHNIQUE: Contiguous axial images were obtained from the base of the skull through  the vertex without intravenous contrast. COMPARISON:  None. FINDINGS: Brain: No acute intracranial abnormality. Specifically, no hemorrhage, hydrocephalus, mass lesion, acute infarction, or significant intracranial injury. Dural calcifications present. Vascular: No hyperdense vessel or unexpected calcification. Skull: No acute calvarial abnormality. Sinuses/Orbits: Visualized paranasal sinuses and mastoids clear. Orbital soft tissues unremarkable. Other: None IMPRESSION: No acute intracranial abnormality. Electronically Signed   By: Rolm Baptise M.D.   On: 03/08/2018 12:59    Procedures Procedures (including critical care time)  Medications Ordered in ED Medications  ondansetron (ZOFRAN) injection 4 mg (4 mg Intravenous Given 03/08/18 1143)  LORazepam (ATIVAN) injection 1 mg (1 mg Intravenous Given 03/08/18 1144)  sodium chloride 0.9 % bolus 1,000 mL (0 mLs Intravenous Stopped 03/08/18 1259)     Initial Impression / Assessment and Plan / ED Course  I have reviewed the triage vital signs and the nursing notes.  Pertinent labs & imaging results that were available during my care of the patient were reviewed by me and considered in my medical decision making (see chart for details).     1:49 PM Symptomatically feels much better here in the emergency department.  No focal neuro deficit.  CT imaging of her head demonstrates no intracranial abnormality.  Home with standard medications.  Primary care follow-up.  No indication for MRI or other advanced imaging.  Discharged home in good condition.  Patient encouraged to return to the ER for new or worsening symptoms  Final Clinical Impressions(s) / ED Diagnoses   Final diagnoses:  Acute severe vertigo    ED Discharge Orders         Ordered    meclizine (ANTIVERT) 25 MG tablet  3 times daily PRN     03/08/18 1348  ondansetron (ZOFRAN ODT) 8 MG disintegrating tablet  Every 8 hours PRN     03/08/18 1348    diazepam (VALIUM) 5 MG tablet  Every  8 hours PRN     03/08/18 1348           Jola Schmidt, MD 03/08/18 1350

## 2018-03-08 NOTE — ED Notes (Signed)
Pt/family verbalized understanding of discharge instructions.   

## 2018-03-08 NOTE — ED Notes (Signed)
Pt sts that she just received a call from MD that her RT breast bx was positive for a malignancy. Pt is taking the new well at this time.

## 2018-03-08 NOTE — ED Triage Notes (Addendum)
Pt reports sudden onset of her usual vertigo sx, "the room was spinning and I started throwing up" at 7am. States she felt fine yesterday, dizzyness worsens with position changes. Pt states she took an antivert but could not keep it down.

## 2018-03-08 NOTE — ED Notes (Signed)
Patient transported to CT 

## 2018-03-10 ENCOUNTER — Encounter: Payer: Self-pay | Admitting: *Deleted

## 2018-03-10 DIAGNOSIS — D0511 Intraductal carcinoma in situ of right breast: Secondary | ICD-10-CM | POA: Insufficient documentation

## 2018-03-13 ENCOUNTER — Encounter: Payer: Self-pay | Admitting: Family Medicine

## 2018-03-13 ENCOUNTER — Ambulatory Visit (INDEPENDENT_AMBULATORY_CARE_PROVIDER_SITE_OTHER): Payer: Medicare Other | Admitting: Family Medicine

## 2018-03-13 ENCOUNTER — Encounter: Payer: Self-pay | Admitting: *Deleted

## 2018-03-13 DIAGNOSIS — E1165 Type 2 diabetes mellitus with hyperglycemia: Secondary | ICD-10-CM | POA: Diagnosis not present

## 2018-03-13 DIAGNOSIS — D0511 Intraductal carcinoma in situ of right breast: Secondary | ICD-10-CM | POA: Diagnosis not present

## 2018-03-13 DIAGNOSIS — I10 Essential (primary) hypertension: Secondary | ICD-10-CM | POA: Diagnosis not present

## 2018-03-13 DIAGNOSIS — R42 Dizziness and giddiness: Secondary | ICD-10-CM | POA: Diagnosis not present

## 2018-03-13 NOTE — Patient Instructions (Addendum)
Ask insurance if they will cover labs done at a [facility] that are then sent to [facility] for resulting  Consider Vestibular Rehab if vertigo persists Hydrate 60-80 oz fluids, add lemon Vertigo Vertigo means that you feel like you are moving when you are not. Vertigo can also make you feel like things around you are moving when they are not. This feeling can come and go at any time. Vertigo often goes away on its own. Follow these instructions at home:  Avoid making fast movements.  Avoid driving.  Avoid using heavy machinery.  Avoid doing any task or activity that might cause danger to you or other people if you would have a vertigo attack while you are doing it.  Sit down right away if you feel dizzy or have trouble with your balance.  Take over-the-counter and prescription medicines only as told by your doctor.  Follow instructions from your doctor about which positions or movements you should avoid.  Drink enough fluid to keep your pee (urine) clear or pale yellow.  Keep all follow-up visits as told by your doctor. This is important. Contact a doctor if:  Medicine does not help your vertigo.  You have a fever.  Your problems get worse or you have new symptoms.  Your family or friends see changes in your behavior.  You feel sick to your stomach (nauseous) or you throw up (vomit).  You have a "pins and needles" feeling or you are numb in part of your body. Get help right away if:  You have trouble moving or talking.  You are always dizzy.  You pass out (faint).  You get very bad headaches.  You feel weak or have trouble using your hands, arms, or legs.  You have changes in your hearing.  You have changes in your seeing (vision).  You get a stiff neck.  Bright light starts to bother you. This information is not intended to replace advice given to you by your health care provider. Make sure you discuss any questions you have with your health care provider. Document  Released: 03/09/2008 Document Revised: 11/06/2015 Document Reviewed: 09/23/2014 Elsevier Interactive Patient Education  [employer].

## 2018-03-13 NOTE — Assessment & Plan Note (Signed)
Has just been diagnosed and she has her first appointment with the nurse navigator at oncology soon.

## 2018-03-13 NOTE — Assessment & Plan Note (Signed)
minimize simple carbs. Increase exercise as tolerated. Continue current meds  

## 2018-03-13 NOTE — Assessment & Plan Note (Signed)
Well controlled, no changes to meds. Encouraged heart healthy diet such as the DASH diet and exercise as tolerated.  °

## 2018-03-13 NOTE — Assessment & Plan Note (Signed)
Was seen in ER with severe vertigo nausea and vomiting. CT of head was negative and she is feeling better on the Meclizine and Diazepam. Will continue Meclizine and will use the Diazepam prn fo now. Report if she needs refills and stay well hydrated.

## 2018-03-13 NOTE — Progress Notes (Signed)
Subjective:    Patient ID: [name], female    DOB: [birth year], 67 y.o.   MRN: [id number]  No chief complaint on file.   HPI Patient is in today for ER follow up after presenting to ER with vertigo. CT of head was negative and she is feeling better on the Meclizine and diazepam. On 9/25 she had sudden onset severe vertigo with spinning, nausea and vomiting. Her work up was negative. She denies any trauma or head trauma. No recent febrile or respiratory illness. No vision or hearing changes or other neurologic complaints. She has been under a great deal of stress with a new diagnosis of DCIS. Denies CP/palp/SOB/HA/congestion/fevers/GI or GU c/o. Taking meds as prescribed  Past Medical History:  Diagnosis Date  . Acute bronchitis 10/31/2015  . Allergy    seasonal  . Arthritis    knee-left  . Cataract    right eye  . Depression    job/stress related  . Diabetes mellitus 52   type 2  . Diaphragmatic hernia without mention of obstruction or gangrene   . Endometriosis   . Esophageal reflux   . Esophageal stricture   . GERD (gastroesophageal reflux disease)   . Hyperlipidemia   . Hyperplastic colon polyp   . Hypertension 52  . Nonalcoholic fatty liver disease 04/28/2010   Qualifier: Diagnosis of  By: Arnoldo Morale MD, Balinda Quails   . Nonspecific abnormal results of liver function study   . Pharyngitis 02/18/2012  . Plantar fascial fibromatosis   . Preventative health care 01/22/2013  . Seasonal allergies 10/31/2015  . Sleep apnea    mouth piece per pt.  . Sore throat 04/03/2017  . Tubular adenoma of colon   . Unspecified sleep apnea    no c-pap    Past Surgical History:  Procedure Laterality Date  . ABDOMINAL HYSTERECTOMY  1982   partial  . APPENDECTOMY    . 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 BREAST CYST INCISION AND DRAINAGE     right breast  . CATARACT EXTRACTION     right eye  . HERNIA REPAIR     per pt, she is not aware of this surgery  . seba    . TUBAL LIGATION       Family History  Problem Relation Age of Onset  . Breast cancer Mother   . Hypertension Mother   . Diabetes Mother        type 2  . Other Mother        esophagus surgery  . Heart disease Father   . Hyperlipidemia Father   . Hypertension Father   . Heart attack Father        MI at age 71  . Diabetes Brother        type 2  . Heart disease Maternal Grandmother   . Heart attack Maternal Grandfather   . Hypertension Maternal Grandfather   . Stroke Paternal Grandmother   . Hypertension Sister   . Diabetes Sister   . Hypertension Brother   . Alcohol abuse Maternal Aunt   . Colon cancer Neg Hx   . Stomach cancer Neg Hx   . Esophageal cancer Neg Hx     Social History   Socioeconomic History  . Marital status: Married    Spouse name: [name]  . Number of children: 2  . Years of education: Not on file  . Highest education level: Not on file  Occupational History  . Occupation:  Designer, television/film set    Employer: [employer]  Social Needs  . Financial resource strain: Not on file  . Food insecurity:    Worry: Not on file    Inability: Not on file  . Transportation needs:    Medical: Not on file    Non-medical: Not on file  Tobacco Use  . Smoking status: Never Smoker  . Smokeless tobacco: Never Used  Substance and Sexual Activity  . Alcohol use: No    Alcohol/week: 0.0 standard drinks  . Drug use: No  . Sexual activity: Yes    Comment: lives with husband, no dietary restrictions just watching carbs, wears seat belt  Lifestyle  . Physical activity:    Days per week: Not on file    Minutes per session: Not on file  . Stress: Not on file  Relationships  . Social connections:    Talks on phone: Not on file    Gets together: Not on file    Attends religious service: Not on file    Active member of club or organization: Not on file    Attends meetings of clubs or organizations: Not on file    Relationship status: Not on file  . Intimate partner violence:    Fear of  current or ex partner: Not on file    Emotionally abused: Not on file    Physically abused: Not on file    Forced sexual activity: Not on file  Other Topics Concern  . Not on file  Social History Narrative  . Not on file    Outpatient Medications Prior to Visit  Medication Sig Dispense Refill  . ASPIR-LOW 81 MG EC tablet TAKE 1 TABLET BY MOUTH DAILY 30 tablet 3  . bisoprolol-hydrochlorothiazide (ZIAC) 5-6.25 MG tablet TAKE 1 TABLET BY MOUTH DAILY. 90 tablet 0  . CONTOUR NEXT TEST test strip USE TO CHECK BLOOD SUGAR DAILY AS NEEDED 100 each 5  . diazepam (VALIUM) 5 MG tablet Take 1 tablet (5 mg total) by mouth every 8 (eight) hours as needed (dizziness). 12 tablet 0  . dicyclomine (BENTYL) 10 MG capsule Take 1 capsule (10 mg total) by mouth 3 (three) times daily before meals. 90 capsule 11  . fluvastatin XL (LESCOL XL) 80 MG 24 hr tablet Take 1 tablet (80 mg total) by mouth daily. 30 tablet 11  . glimepiride (AMARYL) 1 MG tablet Take 1 mg in am and 2 mg before a larger dinner. 270 tablet 3  . INVOKANA 100 MG TABS tablet TAKE 1 TABLET BY MOUTH DAILY. 30 tablet 5  . JANUVIA 100 MG tablet TAKE 1 TABLET (100 MG TOTAL) BY MOUTH DAILY. 30 tablet 3  . KRILL OIL PO Take 1 tablet by mouth daily.     . meclizine (ANTIVERT) 25 MG tablet Take 1 tablet (25 mg total) by mouth 3 (three) times daily as needed for dizziness. 15 tablet 0  . metFORMIN (GLUCOPHAGE) 1000 MG tablet TAKE 1 TABLET BY MOUTH TWICE A DAY AND TAKE 1/2 TABLET AT NOON 75 tablet 1  . omeprazole (PRILOSEC) 40 MG capsule TAKE ONE CAPSULE BY MOUTH 2 TIMES A DAY 60 capsule 1  . ondansetron (ZOFRAN ODT) 8 MG disintegrating tablet Take 1 tablet (8 mg total) by mouth every 8 (eight) hours as needed for nausea or vomiting. 10 tablet 0  . Probiotic Product (PROBIOTIC DAILY PO) Take 1 capsule by mouth daily.    . rosuvastatin (CRESTOR) 5 MG tablet Take 1 tablet (5 mg total)  by mouth every other day. 15 tablet 2   No facility-administered  medications prior to visit.     Allergies  Allergen Reactions  . Pravastatin Other (See Comments)    Myalgia, fatigue, memory loss  . Amoxicillin Itching    REACTION: rash  . Atorvastatin Other (See Comments)    Myalgia, fatigue, memory loss  . Codeine Nausea And Vomiting    Review of Systems  Constitutional: Negative for fever and malaise/fatigue.  HENT: Negative for congestion.   Eyes: Negative for blurred vision.  Respiratory: Negative for shortness of breath.   Cardiovascular: Negative for chest pain, palpitations and leg swelling.  Gastrointestinal: Negative for abdominal pain, blood in stool and nausea.  Genitourinary: Negative for dysuria and frequency.  Musculoskeletal: Negative for falls.  Skin: Negative for rash.  Neurological: Positive for dizziness. Negative for loss of consciousness and headaches.  Endo/Heme/Allergies: Negative for environmental allergies.  Psychiatric/Behavioral: Negative for depression. The patient is nervous/anxious.        Objective:    Physical Exam  Constitutional: She is oriented to person, place, and time. She appears well-developed and well-nourished. No distress.  HENT:  Head: Normocephalic and atraumatic.  Nose: Nose normal.  Eyes: Right eye exhibits no discharge. Left eye exhibits no discharge.  Neck: Normal range of motion. Neck supple.  Cardiovascular: Normal rate and regular rhythm.  No murmur heard. Pulmonary/Chest: Effort normal and breath sounds normal.  Abdominal: Soft. Bowel sounds are normal. There is no tenderness.  Musculoskeletal: She exhibits no edema.  Neurological: She is alert and oriented to person, place, and time.  Skin: Skin is warm and dry.  Psychiatric: She has a normal mood and affect.  Nursing note and vitals reviewed.   BP 112/70 (BP Location: Left Arm, Patient Position: Sitting, Cuff Size: Normal)   Pulse 83   Temp (!) 97.4 F (36.3 C) (Oral)   Resp 18   Wt 193 lb 3.2 oz (87.6 kg)   SpO2 97%    BMI 34.22 kg/m  Wt Readings from Last 3 Encounters:  03/13/18 193 lb 3.2 oz (87.6 kg)  03/08/18 193 lb (87.5 kg)  12/30/17 192 lb (87.1 kg)     Lab Results  Component Value Date   WBC 5.9 03/08/2018   HGB 13.8 03/08/2018   HCT 41.3 03/08/2018   PLT 223 03/08/2018   GLUCOSE 189 (H) 03/08/2018   CHOL 211 (H) 09/29/2017   TRIG 107.0 09/29/2017   HDL 39.90 09/29/2017   LDLCALC 150 (H) 09/29/2017   ALT 47 (H) 12/30/2017   AST 33 12/30/2017   NA 141 03/08/2018   K 3.7 03/08/2018   CL 105 03/08/2018   CREATININE 0.93 03/08/2018   BUN 15 03/08/2018   CO2 23 03/08/2018   TSH 1.12 09/29/2017   HGBA1C 7.1 (H) 09/29/2017   MICROALBUR <0.7 12/04/2015    Lab Results  Component Value Date   TSH 1.12 09/29/2017   Lab Results  Component Value Date   WBC 5.9 03/08/2018   HGB 13.8 03/08/2018   HCT 41.3 03/08/2018   MCV 78.2 03/08/2018   PLT 223 03/08/2018   Lab Results  Component Value Date   NA 141 03/08/2018   K 3.7 03/08/2018   CO2 23 03/08/2018   GLUCOSE 189 (H) 03/08/2018   BUN 15 03/08/2018   CREATININE 0.93 03/08/2018   BILITOT 0.5 12/30/2017   ALKPHOS 74 12/30/2017   AST 33 12/30/2017   ALT 47 (H) 12/30/2017   PROT 7.6 12/30/2017  ALBUMIN 3.7 12/30/2017   CALCIUM 9.2 03/08/2018   ANIONGAP 13 03/08/2018   GFR 82.92 09/29/2017   Lab Results  Component Value Date   CHOL 211 (H) 09/29/2017   Lab Results  Component Value Date   HDL 39.90 09/29/2017   Lab Results  Component Value Date   LDLCALC 150 (H) 09/29/2017   Lab Results  Component Value Date   TRIG 107.0 09/29/2017   Lab Results  Component Value Date   CHOLHDL 5 09/29/2017   Lab Results  Component Value Date   HGBA1C 7.1 (H) 09/29/2017       Assessment & Plan:   Problem List Items Addressed This Visit    Type 2 diabetes mellitus with hyperglycemia, without long-term current use of insulin (HCC)     minimize simple carbs. Increase exercise as tolerated. Continue current meds       Essential hypertension    Well controlled, no changes to meds. Encouraged heart healthy diet such as the DASH diet and exercise as tolerated.       DCIS (ductal carcinoma in situ) of breast    Has just been diagnosed and she has her first appointment with the nurse navigator at oncology soon.       Vertigo    Was seen in ER with severe vertigo nausea and vomiting. CT of head was negative and she is feeling better on the Meclizine and Diazepam. Will continue Meclizine and will use the Diazepam prn fo now. Report if she needs refills and stay well hydrated.          I am having Joycelyn Schmid Mckethan maintain her Probiotic Product (PROBIOTIC DAILY PO), KRILL OIL PO, ASPIR-LOW, rosuvastatin, dicyclomine, fluvastatin XL, glimepiride, CONTOUR NEXT TEST, bisoprolol-hydrochlorothiazide, omeprazole, metFORMIN, INVOKANA, JANUVIA, meclizine, ondansetron, and diazepam.  No orders of the defined types were placed in this encounter.    [name], MD

## 2018-03-14 ENCOUNTER — Telehealth: Payer: Self-pay | Admitting: *Deleted

## 2018-03-14 NOTE — Telephone Encounter (Signed)
Received Mammogram results from [facility]; forwarded to provider/SLS 10/01

## 2018-03-14 NOTE — Progress Notes (Signed)
[place]  [phone] [phone]     ID: [name] DOB: 1951/04/17  MR#: [id number]  [id number]#:[id number]  Patient Care Team: Mosie Lukes, MD as PCP - General (Family Medicine) Jola Schmidt, MD as Consulting Physician (Ophthalmology) Royston Sinner, Colin Benton, MD as Consulting Physician (Obstetrics and Gynecology) [name], MD as Consulting Physician (General Surgery) Neshia Mckenzie, Virgie Dad, MD as Consulting Physician (Oncology) Philemon Kingdom, MD as Consulting Physician (Internal Medicine) Dene Gentry, MD as Consulting Physician (Sports Medicine) OTHER MD:  CHIEF COMPLAINT: Estrogen receptor negative ductal carcinoma in situ  CURRENT TREATMENT: Awaiting definitive surgery   HISTORY OF CURRENT ILLNESS: [name] had routine screening mammography on 02/20/2018 at [facility] showing a possible abnormality in the right breast. She underwent unilateral right diagnostic mammography with tomography and right breast ultrasonography at [facility] on 03/10/2018 showing: breast density category C. There is a cluster of heterogeneous calcifications in the right breast upper central quadrant. There was no sonographic abnormalities or lymphadenopathy in the right axilla.  Accordingly on 03/07/2018 she proceeded to biopsy of the right breast area in question. The pathology from this procedure showed ([id number]): Ductal carcinoma in situ, high grade. There are foci suspicious for early stromal invasion. Prognostic indicators significant for: both estrogen receptor and progesterone receptor, 0% negative.  The patient's subsequent history is as detailed below.  INTERVAL HISTORY: [name] was evaluated in the multidisciplinary breast cancer clinic on 03/15/2018 accompanied by her daughter, [name]. The patient's case was also presented at the multidisciplinary breast cancer conference on the same day. At that time a preliminary plan was proposed: Breast  conserving surgery with sentinel lymph node sampling followed by adjuvant radiation.   REVIEW OF SYSTEMS: There were no specific symptoms leading to the original mammogram, which was routinely scheduled. The patient denies unusual headaches, visual changes, nausea, vomiting, stiff neck, dizziness, or gait imbalance. There has been no cough, phlegm production, or pleurisy, no chest pain or pressure, and no change in bowel or bladder habits. The patient denies fever, rash, bleeding, unexplained fatigue or unexplained weight loss. A detailed review of systems was otherwise entirely negative.   PAST MEDICAL HISTORY: Past Medical History:  Diagnosis Date  . Acute bronchitis 10/31/2015  . Allergy    seasonal  . Arthritis    knee-left  . Cataract    right eye  . Depression    job/stress related  . Diabetes mellitus 52   type 2  . Diaphragmatic hernia without mention of obstruction or gangrene   . Endometriosis   . Esophageal reflux   . Esophageal stricture   . GERD (gastroesophageal reflux disease)   . Hyperlipidemia   . Hyperplastic colon polyp   . Hypertension 52  . Nonalcoholic fatty liver disease 04/28/2010   Qualifier: Diagnosis of  By: Arnoldo Morale MD, Balinda Quails   . Nonspecific abnormal results of liver function study   . Pharyngitis 02/18/2012  . Plantar fascial fibromatosis   . Preventative health care 01/22/2013  . Seasonal allergies 10/31/2015  . Sleep apnea    mouth piece per pt.  . Sore throat 04/03/2017  . Tubular adenoma of colon   . Unspecified sleep apnea    no c-pap    PAST SURGICAL HISTORY: Past Surgical History:  Procedure Laterality Date  . ABDOMINAL HYSTERECTOMY  1982   partial  . APPENDECTOMY    . 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 BREAST CYST INCISION AND DRAINAGE     right breast  .  CATARACT EXTRACTION     right eye  . HERNIA REPAIR     per pt, she is not aware of this surgery  . seba    . TUBAL LIGATION    Partial hysterectomy  FAMILY  HISTORY Family History  Problem Relation Age of Onset  . Breast cancer Mother   . Hypertension Mother   . Diabetes Mother        type 2  . Other Mother        esophagus surgery  . Heart disease Father   . Hyperlipidemia Father   . Hypertension Father   . Heart attack Father        MI at age 42  . Diabetes Brother        type 2  . Heart disease Maternal Grandmother   . Heart attack Maternal Grandfather   . Hypertension Maternal Grandfather   . Stroke Paternal Grandmother   . Hypertension Sister   . Diabetes Sister   . Hypertension Brother   . Alcohol abuse Maternal Aunt   . Colon cancer Neg Hx   . Stomach cancer Neg Hx   . Esophageal cancer Neg Hx   The patient's father died at age 53 due to CHF. The patient's mother died at age 87 due to breast cancer diagnosed at age 65. The patient had 3 brothers and 3 sisters. There was a paternal aunt with breast cancer diagnosed in the 42's. There was also a maternal aunt with ovarian cancer that passed away. The patient notes that she had prior genetics testing in 2018 for ovarian and breast under Dr. Royston Sinner with normal results.   GYNECOLOGIC HISTORY:  No LMP recorded. Patient has had a hysterectomy. Menarche: 67 years old Age at first live birth: 67 years old She is [place] P2.  She is status post partial hysterectomy without BSO. Her LMP: was in 74 ( late 30's). She took hormone replacement for 3 years. She also took oral contraception for 3 years with no complications.    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 Her husband, Christine Reading "Dexter" works in transportation and also worked as a Dealer. The patient's daughter, [name] lives in [place] and is a Hotel manager. The patient's daughter, [name] lives in [place] and works as a Engineer, mining. The patient has 2 grandchildren and 1 great grandchild plus one on the way.      ADVANCED DIRECTIVES:    HEALTH MAINTENANCE: Social History   Tobacco Use  . Smoking  status: Never Smoker  . Smokeless tobacco: Never Used  Substance Use Topics  . Alcohol use: No    Alcohol/week: 0.0 standard drinks  . Drug use: No     Colonoscopy: 11/18/2015/ Dr. Fuller Plan  PAP: 03/2017  Bone density: 2018   Allergies  Allergen Reactions  . Pravastatin Other (See Comments)    Myalgia, fatigue, memory loss  . Amoxicillin Itching    REACTION: rash  . Atorvastatin Other (See Comments)    Myalgia, fatigue, memory loss  . Codeine Nausea And Vomiting    Current Outpatient Medications  Medication Sig Dispense Refill  . ASPIR-LOW 81 MG EC tablet TAKE 1 TABLET BY MOUTH DAILY 30 tablet 3  . bisoprolol-hydrochlorothiazide (ZIAC) 5-6.25 MG tablet TAKE 1 TABLET BY MOUTH DAILY. 90 tablet 0  . CONTOUR NEXT TEST test strip USE TO CHECK BLOOD SUGAR DAILY AS NEEDED 100 each 5  . diazepam (VALIUM) 5 MG tablet Take 1 tablet (5 mg total) by  mouth every 8 (eight) hours as needed (dizziness). 12 tablet 0  . glimepiride (AMARYL) 1 MG tablet Take 1 mg in am and 2 mg before a larger dinner. 270 tablet 3  . JANUVIA 100 MG tablet TAKE 1 TABLET (100 MG TOTAL) BY MOUTH DAILY. 30 tablet 3  . meclizine (ANTIVERT) 25 MG tablet Take 1 tablet (25 mg total) by mouth 3 (three) times daily as needed for dizziness. 15 tablet 0  . metFORMIN (GLUCOPHAGE) 1000 MG tablet TAKE 1 TABLET BY MOUTH TWICE A DAY AND TAKE 1/2 TABLET AT NOON 75 tablet 1  . omeprazole (PRILOSEC) 40 MG capsule TAKE ONE CAPSULE BY MOUTH 2 TIMES A DAY 60 capsule 1  . ondansetron (ZOFRAN ODT) 8 MG disintegrating tablet Take 1 tablet (8 mg total) by mouth every 8 (eight) hours as needed for nausea or vomiting. 10 tablet 0  . LORazepam (ATIVAN) 0.5 MG tablet Take 1 tablet (0.5 mg total) by mouth every 8 (eight) hours. 30 tablet 0   No current facility-administered medications for this visit.     OBJECTIVE: Middle-aged white woman in no acute distress  Vitals:   03/15/18 1306  BP: 127/85  Pulse: (!) 106  Resp: 18  Temp: 98.2 F  (36.8 C)  SpO2: 100%     Body mass index is 34.15 kg/m.   Wt Readings from Last 3 Encounters:  03/15/18 192 lb 12.8 oz (87.5 kg)  03/13/18 193 lb 3.2 oz (87.6 kg)  03/08/18 193 lb (87.5 kg)      ECOG FS:0 - Asymptomatic  Ocular: Sclerae unicteric, pupils round and equal Lymphatic: No cervical or supraclavicular adenopathy Lungs no rales or rhonchi Heart regular rate and rhythm Abd soft, nontender, positive bowel sounds MSK no focal spinal tenderness, no joint edema Neuro: non-focal, well-oriented, appropriate affect Breasts: The right breast is status post recent biopsy.  There are no skin or nipple changes of concern.  The left breast is benign.  Both axillae are benign.   LAB RESULTS:  CMP     Component Value Date/Time   NA 142 03/15/2018 1206   K 3.9 03/15/2018 1206   CL 104 03/15/2018 1206   CO2 22 03/15/2018 1206   GLUCOSE 164 (H) 03/15/2018 1206   GLUCOSE 141 (H) 03/28/2006 0843   BUN 19 03/15/2018 1206   CREATININE 1.12 (H) 03/15/2018 1206   CREATININE 0.85 09/27/2013 1224   CALCIUM 10.1 03/15/2018 1206   PROT 8.2 (H) 03/15/2018 1206   ALBUMIN 4.0 03/15/2018 1206   AST 44 (H) 03/15/2018 1206   ALT 70 (H) 03/15/2018 1206   ALKPHOS 96 03/15/2018 1206   BILITOT 0.5 03/15/2018 1206   GFRNONAA 50 (L) 03/15/2018 1206   GFRAA 58 (L) 03/15/2018 1206    No results found for: TOTALPROTELP, ALBUMINELP, A1GS, A2GS, BETS, BETA2SER, GAMS, MSPIKE, SPEI  No results found for: KPAFRELGTCHN, LAMBDASER, KAPLAMBRATIO  Lab Results  Component Value Date   WBC 6.7 03/15/2018   NEUTROABS 3.6 03/15/2018   HGB 14.3 03/15/2018   HCT 43.4 03/15/2018   MCV 79.8 03/15/2018   PLT 277 03/15/2018    @LASTCHEMISTRY @  No results found for: LABCA2  No components found for: KYHCWC376  No results for input(s): INR in the last 168 hours.  No results found for: LABCA2  No results found for: EGB151  No results found for: VOH607  No results found for: PXT062  No results  found for: CA2729  No components found for: HGQUANT  No results found for:  CEA1 / No results found for: CEA1   No results found for: AFPTUMOR  No results found for: [place]  No results found for: PSA1  Appointment on 03/15/2018  Component Date Value Ref Range Status  . Sodium 03/15/2018 142  135 - 145 mmol/L Final  . Potassium 03/15/2018 3.9  3.5 - 5.1 mmol/L Final  . Chloride 03/15/2018 104  98 - 111 mmol/L Final  . CO2 03/15/2018 22  22 - 32 mmol/L Final  . Glucose, Bld 03/15/2018 164* 70 - 99 mg/dL Final  . BUN 03/15/2018 19  8 - 23 mg/dL Final  . Creatinine 03/15/2018 1.12* 0.44 - 1.00 mg/dL Final  . Calcium 03/15/2018 10.1  8.9 - 10.3 mg/dL Final  . Total Protein 03/15/2018 8.2* 6.5 - 8.1 g/dL Final  . Albumin 03/15/2018 4.0  3.5 - 5.0 g/dL Final  . AST 03/15/2018 44* 15 - 41 U/L Final  . ALT 03/15/2018 70* 0 - 44 U/L Final  . Alkaline Phosphatase 03/15/2018 96  38 - 126 U/L Final  . Total Bilirubin 03/15/2018 0.5  0.3 - 1.2 mg/dL Final  . GFR, Est Non Af Am 03/15/2018 50* >60 mL/min Final  . GFR, Est AFR Am 03/15/2018 58* >60 mL/min Final   Comment: (NOTE) The eGFR has been calculated using the CKD EPI equation. This calculation has not been validated in all clinical situations. eGFR's persistently <60 mL/min signify possible Chronic Kidney Disease.   [name] 03/15/2018 16* 5 - 15 Final   Performed at [facility], [place] [address], [place], [place] [address]  . WBC Count 03/15/2018 6.7  3.9 - 10.3 K/uL Final  . RBC 03/15/2018 5.44  3.70 - 5.45 MIL/uL Final  . Hemoglobin 03/15/2018 14.3  11.6 - 15.9 g/dL Final  . HCT 03/15/2018 43.4  34.8 - 46.6 % Final  . MCV 03/15/2018 79.8  79.5 - 101.0 fL Final  . MCH 03/15/2018 26.3  25.1 - 34.0 pg Final  . MCHC 03/15/2018 32.9  31.5 - 36.0 g/dL Final  . RDW 03/15/2018 14.3  11.2 - 14.5 % Final  . Platelet Count 03/15/2018 277  145 - 400 K/uL Final  . Neutrophils Relative % 03/15/2018 53  % Final   . Neutro Abs 03/15/2018 3.6  1.5 - 6.5 K/uL Final  . Lymphocytes Relative 03/15/2018 38  % Final  . Lymphs Abs 03/15/2018 2.6  0.9 - 3.3 K/uL Final  . Monocytes Relative 03/15/2018 6  % Final  . Monocytes Absolute 03/15/2018 0.4  0.1 - 0.9 K/uL Final  . Eosinophils Relative 03/15/2018 2  % Final  . Eosinophils Absolute 03/15/2018 0.1  0.0 - 0.5 K/uL Final  . Basophils Relative 03/15/2018 1  % Final  . Basophils Absolute 03/15/2018 0.0  0.0 - 0.1 K/uL Final   Performed at [facility], [place] [address] [place], [place] [address]    (this displays the last labs from the last 3 days)  No results found for: TOTALPROTELP, ALBUMINELP, A1GS, A2GS, BETS, BETA2SER, GAMS, MSPIKE, SPEI (this displays SPEP labs)  No results found for: KPAFRELGTCHN, LAMBDASER, KAPLAMBRATIO (kappa/lambda light chains)  No results found for: HGBA, HGBA2QUANT, HGBFQUANT, HGBSQUAN (Hemoglobinopathy evaluation)   No results found for: LDH  No results found for: IRON, TIBC, IRONPCTSAT (Iron and TIBC)  No results found for: FERRITIN  Urinalysis    Component Value Date/Time   COLORURINE YELLOW 07/18/2017 0908   APPEARANCEUR CLEAR 07/18/2017 0908   LABSPEC 1.020 07/18/2017 0908  PHURINE 5.5 07/18/2017 0908   GLUCOSEU >=500 (A) 07/18/2017 0908   HGBUR NEGATIVE 07/18/2017 0908   HGBUR negative 08/22/2009 0814   BILIRUBINUR NEGATIVE 07/18/2017 0908   BILIRUBINUR negative 07/27/2016 1535   KETONESUR NEGATIVE 07/18/2017 0908   PROTEINUR NEGATIVE 07/18/2017 0908   UROBILINOGEN 4.0 07/27/2016 1535   UROBILINOGEN 0.2 05/28/2013 1121   NITRITE NEGATIVE 07/18/2017 0908   LEUKOCYTESUR NEGATIVE 07/18/2017 0908     STUDIES: Ct Head Wo Contrast  Result Date: 03/08/2018 CLINICAL DATA:  Vertigo, nausea EXAM: CT HEAD WITHOUT CONTRAST TECHNIQUE: Contiguous axial images were obtained from the base of the skull through the vertex without intravenous contrast. COMPARISON:  None. FINDINGS: Brain:  No acute intracranial abnormality. Specifically, no hemorrhage, hydrocephalus, mass lesion, acute infarction, or significant intracranial injury. Dural calcifications present. Vascular: No hyperdense vessel or unexpected calcification. Skull: No acute calvarial abnormality. Sinuses/Orbits: Visualized paranasal sinuses and mastoids clear. Orbital soft tissues unremarkable. Other: None IMPRESSION: No acute intracranial abnormality. Electronically Signed   By: Rolm Baptise M.D.   On: 03/08/2018 12:59    ELIGIBLE FOR AVAILABLE RESEARCH PROTOCOL: no  ASSESSMENT: 67 y.o. [place], [place] woman status post right breast biopsy 03/07/2018 for ductal carcinoma in situ, grade 3, estrogen and progesterone receptor negative  (1) definitive surgery pending  (2) adjuvant radiation to follow  (3) the patient has had genetics testing through her gynecologist.  Per patient no deleterious mutations found  PLAN: I spent approximately 60 minutes face to face with Joycelyn Schmid with more than 50% of that time spent in counseling and coordination of care. Specifically we reviewed the biology of the patient's diagnosis and the specifics of her situation.  Zoeya understands that in noninvasive ductal carcinoma, also called ductal carcinoma in situ ("DCIS") the breast cancer cells remain trapped in the ducts were they started. They cannot travel to a vital organ. For that reason these cancers in themselves are not life-threatening.  If the whole breast is removed then all the ducts are removed and since the cancer cells are trapped in the ducts, the cure rate with mastectomy for noninvasive breast cancer is approximately 99%. Nevertheless we recommend lumpectomy, because there is no survival advantage to mastectomy and because the cosmetic result is generally superior with breast conservation.  Since the patient is keeping her breasts, there will be some risk of recurrence. The recurrence can only be in the same breast since,  again, the cells are trapped in the ducts. There is no connection from one breast to the other. The risk of local recurrence is cut by more than half with radiation, which is standard in this situation.  In estrogen receptor positive cancers anti-estrogens can also be considered.  In this case however, the cancer being estrogen receptor negative, there is no indication for antiestrogen treatment.  In patients who carry a deleterious mutation [for example in a  BRCA gene], the risk of a new breast cancer developing in the future may be sufficiently great that the patient may choose bilateral mastectomies. However if she wishes to keep her breasts in that situation it is safe to do so. That would require intensified screening, which generally means not only yearly mammography but a yearly breast MRI as well.  Hadyn tells me she has been tested by her gynecologist and does not carry a definable mutation.  Accordingly more extensive surgery is not being contemplated  Accordingly the overall plan is for surgery, followed by radiation.  Klarisa has a good understanding of the overall plan.  She agrees with it. She knows the goal of treatment in her case is cure. She knows we will be available for any problems that may develop regarding breast cancer in the future.  Leonte Horrigan, Virgie Dad, MD  03/15/18 4:22 PM Medical Oncology and Hematology [facility] [address] [place], [place] [address] Tel. [phone]    Fax. [phone]  Alice Rieger, am acting as scribe for Chauncey Cruel MD.  I, Lurline Del MD, have reviewed the above documentation for accuracy and completeness, and I agree with the above.

## 2018-03-15 ENCOUNTER — Ambulatory Visit
Admission: RE | Admit: 2018-03-15 | Discharge: 2018-03-15 | Disposition: A | Discharge status: Home / Self Care | Payer: Medicare Other | Source: Ambulatory Visit | Attending: Radiation Oncology | Admitting: Radiation Oncology

## 2018-03-15 ENCOUNTER — Encounter: Payer: Self-pay | Admitting: Oncology

## 2018-03-15 ENCOUNTER — Other Ambulatory Visit: Payer: Self-pay | Admitting: Oncology

## 2018-03-15 ENCOUNTER — Inpatient Hospital Stay: Payer: Medicare Other | Attending: Oncology | Admitting: Oncology

## 2018-03-15 ENCOUNTER — Other Ambulatory Visit: Payer: Self-pay | Admitting: General Surgery

## 2018-03-15 ENCOUNTER — Inpatient Hospital Stay: Payer: Medicare Other

## 2018-03-15 ENCOUNTER — Ambulatory Visit: Payer: Medicare Other | Admitting: Physical Therapy

## 2018-03-15 VITALS — BP 127/85 | HR 106 | Temp 98.2°F | Resp 18 | Ht 63.0 in | Wt 192.8 lb

## 2018-03-15 DIAGNOSIS — E119 Type 2 diabetes mellitus without complications: Secondary | ICD-10-CM

## 2018-03-15 DIAGNOSIS — E785 Hyperlipidemia, unspecified: Secondary | ICD-10-CM

## 2018-03-15 DIAGNOSIS — M129 Arthropathy, unspecified: Secondary | ICD-10-CM | POA: Diagnosis not present

## 2018-03-15 DIAGNOSIS — Z79899 Other long term (current) drug therapy: Secondary | ICD-10-CM

## 2018-03-15 DIAGNOSIS — Z171 Estrogen receptor negative status [ER-]: Secondary | ICD-10-CM | POA: Diagnosis not present

## 2018-03-15 DIAGNOSIS — I10 Essential (primary) hypertension: Secondary | ICD-10-CM | POA: Insufficient documentation

## 2018-03-15 DIAGNOSIS — F329 Major depressive disorder, single episode, unspecified: Secondary | ICD-10-CM

## 2018-03-15 DIAGNOSIS — K760 Fatty (change of) liver, not elsewhere classified: Secondary | ICD-10-CM

## 2018-03-15 DIAGNOSIS — K449 Diaphragmatic hernia without obstruction or gangrene: Secondary | ICD-10-CM | POA: Diagnosis not present

## 2018-03-15 DIAGNOSIS — K219 Gastro-esophageal reflux disease without esophagitis: Secondary | ICD-10-CM | POA: Diagnosis not present

## 2018-03-15 DIAGNOSIS — Z86010 Personal history of colonic polyps: Secondary | ICD-10-CM | POA: Insufficient documentation

## 2018-03-15 DIAGNOSIS — D0511 Intraductal carcinoma in situ of right breast: Secondary | ICD-10-CM

## 2018-03-15 DIAGNOSIS — Z803 Family history of malignant neoplasm of breast: Secondary | ICD-10-CM | POA: Diagnosis not present

## 2018-03-15 DIAGNOSIS — C50211 Malignant neoplasm of upper-inner quadrant of right female breast: Secondary | ICD-10-CM | POA: Diagnosis not present

## 2018-03-15 LAB — CMP (CANCER CENTER ONLY)
ALK PHOS: 96 U/L (ref 38–126)
ALT: 70 U/L — AB (ref 0–44)
AST: 44 U/L — AB (ref 15–41)
Albumin: 4 g/dL (ref 3.5–5.0)
Anion gap: 16 — ABNORMAL HIGH (ref 5–15)
BUN: 19 mg/dL (ref 8–23)
CALCIUM: 10.1 mg/dL (ref 8.9–10.3)
CO2: 22 mmol/L (ref 22–32)
CREATININE: 1.12 mg/dL — AB (ref 0.44–1.00)
Chloride: 104 mmol/L (ref 98–111)
GFR, Est AFR Am: 58 mL/min — ABNORMAL LOW (ref >60)
GFR, Estimated: 50 mL/min — ABNORMAL LOW (ref >60)
GLUCOSE: 164 mg/dL — AB (ref 70–99)
Potassium: 3.9 mmol/L (ref 3.5–5.1)
SODIUM: 142 mmol/L (ref 135–145)
Total Bilirubin: 0.5 mg/dL (ref 0.3–1.2)
Total Protein: 8.2 g/dL — ABNORMAL HIGH (ref 6.5–8.1)

## 2018-03-15 LAB — CBC WITH DIFFERENTIAL (CANCER CENTER ONLY)
BASOS PCT: 1 %
Basophils Absolute: 0 10*3/uL (ref 0.0–0.1)
Eosinophils Absolute: 0.1 10*3/uL (ref 0.0–0.5)
Eosinophils Relative: 2 %
HEMATOCRIT: 43.4 % (ref 34.8–46.6)
HEMOGLOBIN: 14.3 g/dL (ref 11.6–15.9)
Lymphocytes Relative: 38 %
Lymphs Abs: 2.6 10*3/uL (ref 0.9–3.3)
MCH: 26.3 pg (ref 25.1–34.0)
MCHC: 32.9 g/dL (ref 31.5–36.0)
MCV: 79.8 fL (ref 79.5–101.0)
MONOS PCT: 6 %
Monocytes Absolute: 0.4 10*3/uL (ref 0.1–0.9)
Neutro Abs: 3.6 10*3/uL (ref 1.5–6.5)
Neutrophils Relative %: 53 %
Platelet Count: 277 10*3/uL (ref 145–400)
RBC: 5.44 MIL/uL (ref 3.70–5.45)
RDW: 14.3 % (ref 11.2–14.5)
WBC Count: 6.7 10*3/uL (ref 3.9–10.3)

## 2018-03-15 MED ORDER — LORAZEPAM 0.5 MG PO TABS: 0.5000 mg | ORAL_TABLET | Freq: Three times a day (TID) | ORAL | 0 refills | Status: DC

## 2018-03-15 NOTE — Progress Notes (Signed)
Radiation Oncology         [phone] ________________________________  Name: [name]        MRN: [id number]  Date of Service: 03/15/2018 DOB: 07-27-1950  WU:[id number], Bonnita Levan, MD  [name], MD     REFERRING PHYSICIAN: [name], MD   DIAGNOSIS: The encounter diagnosis was Ductal carcinoma in situ (DCIS) of right breast.   HISTORY OF PRESENT ILLNESS: [name] is a 67 y.o. female seen in the multidisciplinary breast clinic for a new diagnosis of right breast cancer. The patient was noted to have screening detected calcifications in the right breast.  Diagnostic imaging revealed clusters of calcifications in bilateral breasts that were previously stable but a new 1.7 cm cluster was noted in the central breast.  Ultrasound of her axilla was negative for adenopathy.  A biopsy on 03/07/2018 revealed high-grade DCIS with focus of suspicion for early invasion, ER PR negative.  She comes today to discuss treatment recommendations for her cancer.    PREVIOUS RADIATION THERAPY: No   PAST MEDICAL HISTORY:  Past Medical History:  Diagnosis Date  . Acute bronchitis 10/31/2015  . Allergy    seasonal  . Arthritis    knee-left  . Cataract    right eye  . Depression    job/stress related  . Diabetes mellitus 52   type 2  . Diaphragmatic hernia without mention of obstruction or gangrene   . Endometriosis   . Esophageal reflux   . Esophageal stricture   . GERD (gastroesophageal reflux disease)   . Hyperlipidemia   . Hyperplastic colon polyp   . Hypertension 52  . Nonalcoholic fatty liver disease 04/28/2010   Qualifier: Diagnosis of  By: Arnoldo Morale MD, Balinda Quails   . Nonspecific abnormal results of liver function study   . Pharyngitis 02/18/2012  . Plantar fascial fibromatosis   . Preventative health care 01/22/2013  . Seasonal allergies 10/31/2015  . Sleep apnea    mouth piece per pt.  . Sore throat 04/03/2017  . Tubular adenoma of colon   . Unspecified sleep apnea    no c-pap       PAST SURGICAL HISTORY: Past Surgical History:  Procedure Laterality Date  . ABDOMINAL HYSTERECTOMY  1982   partial  . APPENDECTOMY    . 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 BREAST CYST INCISION AND DRAINAGE     right breast  . CATARACT EXTRACTION     right eye  . HERNIA REPAIR     per pt, she is not aware of this surgery  . seba    . TUBAL LIGATION       FAMILY HISTORY:  Family History  Problem Relation Age of Onset  . Breast cancer Mother   . Hypertension Mother   . Diabetes Mother        type 2  . Other Mother        esophagus surgery  . Heart disease Father   . Hyperlipidemia Father   . Hypertension Father   . Heart attack Father        MI at age 96  . Diabetes Brother        type 2  . Heart disease Maternal Grandmother   . Heart attack Maternal Grandfather   . Hypertension Maternal Grandfather   . Stroke Paternal Grandmother   . Hypertension Sister   . Diabetes Sister   . Hypertension Brother   . Alcohol abuse Maternal Aunt   .  Colon cancer Neg Hx   . Stomach cancer Neg Hx   . Esophageal cancer Neg Hx      SOCIAL HISTORY:  reports that she has never smoked. She has never used smokeless tobacco. She reports that she does not drink alcohol or use drugs. The patient is married and lives in [place]. She is retired from working at [employer]. 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   ALLERGIES: Pravastatin; Amoxicillin; Atorvastatin; and Codeine   MEDICATIONS:  Current Outpatient Medications  Medication Sig Dispense Refill  . ASPIR-LOW 81 MG EC tablet TAKE 1 TABLET BY MOUTH DAILY 30 tablet 3  . bisoprolol-hydrochlorothiazide (ZIAC) 5-6.25 MG tablet TAKE 1 TABLET BY MOUTH DAILY. 90 tablet 0  . CONTOUR NEXT TEST test strip USE TO CHECK BLOOD SUGAR DAILY AS NEEDED 100 each 5  . diazepam (VALIUM) 5 MG tablet Take 1 tablet (5 mg total) by mouth every 8 (eight) hours as needed (dizziness). 12  tablet 0  . dicyclomine (BENTYL) 10 MG capsule Take 1 capsule (10 mg total) by mouth 3 (three) times daily before meals. 90 capsule 11  . fluvastatin XL (LESCOL XL) 80 MG 24 hr tablet Take 1 tablet (80 mg total) by mouth daily. 30 tablet 11  . glimepiride (AMARYL) 1 MG tablet Take 1 mg in am and 2 mg before a larger dinner. 270 tablet 3  . INVOKANA 100 MG TABS tablet TAKE 1 TABLET BY MOUTH DAILY. 30 tablet 5  . JANUVIA 100 MG tablet TAKE 1 TABLET (100 MG TOTAL) BY MOUTH DAILY. 30 tablet 3  . KRILL OIL PO Take 1 tablet by mouth daily.     . meclizine (ANTIVERT) 25 MG tablet Take 1 tablet (25 mg total) by mouth 3 (three) times daily as needed for dizziness. 15 tablet 0  . metFORMIN (GLUCOPHAGE) 1000 MG tablet TAKE 1 TABLET BY MOUTH TWICE A DAY AND TAKE 1/2 TABLET AT NOON 75 tablet 1  . omeprazole (PRILOSEC) 40 MG capsule TAKE ONE CAPSULE BY MOUTH 2 TIMES A DAY 60 capsule 1  . ondansetron (ZOFRAN ODT) 8 MG disintegrating tablet Take 1 tablet (8 mg total) by mouth every 8 (eight) hours as needed for nausea or vomiting. 10 tablet 0  . Probiotic Product (PROBIOTIC DAILY PO) Take 1 capsule by mouth daily.    . rosuvastatin (CRESTOR) 5 MG tablet Take 1 tablet (5 mg total) by mouth every other day. 15 tablet 2   No current facility-administered medications for this visit.      REVIEW OF SYSTEMS: On review of systems, the patient reports that she is doing well overall. She denies any chest pain, shortness of breath, cough, fevers, chills, night sweats, unintended weight changes. She denies any bowel or bladder disturbances, and denies abdominal pain, nausea or vomiting. She denies any new musculoskeletal or joint aches or pains. A complete review of systems is obtained and is otherwise negative.     PHYSICAL EXAM:  Wt Readings from Last 3 Encounters:  03/13/18 193 lb 3.2 oz (87.6 kg)  03/08/18 193 lb (87.5 kg)  12/30/17 192 lb (87.1 kg)   Temp Readings from Last 3 Encounters:  03/13/18 (!) 97.4 F  (36.3 C) (Oral)  03/08/18 97.8 F (36.6 C) (Oral)  12/30/17 97.7 F (36.5 C)   BP Readings from Last 3 Encounters:  03/13/18 112/70  03/08/18 126/63  12/30/17 131/78   Pulse Readings from Last 3 Encounters:  03/13/18 83  03/08/18  88  12/30/17 73     In general this is a well appearing African American female in no acute distress. She is alert and oriented x4 and appropriate throughout the examination. HEENT reveals that the patient is normocephalic, atraumatic. EOMs are intact. Cardiopulmonary assessment is negative for acute distress and she exhibits normal effort. Bilateral breast exam is deferred.   ECOG = 0  0 - Asymptomatic (Fully active, able to carry on all predisease activities without restriction)  1 - Symptomatic but completely ambulatory (Restricted in physically strenuous activity but ambulatory and able to carry out work of a light or sedentary nature. For example, light housework, office work)  2 - Symptomatic, <50% in bed during the day (Ambulatory and capable of all self care but unable to carry out any work activities. Up and about more than 50% of waking hours)  3 - Symptomatic, >50% in bed, but not bedbound (Capable of only limited self-care, confined to bed or chair 50% or more of waking hours)  4 - Bedbound (Completely disabled. Cannot carry on any self-care. Totally confined to bed or chair)  5 - Death   Eustace Pen MM, Creech RH, Tormey DC, et al. [phone]). "Toxicity and response criteria of the [facility]". [place] Oncol. 5 (6): 649-55    LABORATORY DATA:  Lab Results  Component Value Date   WBC 5.9 03/08/2018   HGB 13.8 03/08/2018   HCT 41.3 03/08/2018   MCV 78.2 03/08/2018   PLT 223 03/08/2018   Lab Results  Component Value Date   NA 141 03/08/2018   K 3.7 03/08/2018   CL 105 03/08/2018   CO2 23 03/08/2018   Lab Results  Component Value Date   ALT 47 (H) 12/30/2017   AST 33 12/30/2017   ALKPHOS 74 12/30/2017    BILITOT 0.5 12/30/2017      RADIOGRAPHY: Ct Head Wo Contrast  Result Date: 03/08/2018 CLINICAL DATA:  Vertigo, nausea EXAM: CT HEAD WITHOUT CONTRAST TECHNIQUE: Contiguous axial images were obtained from the base of the skull through the vertex without intravenous contrast. COMPARISON:  None. FINDINGS: Brain: No acute intracranial abnormality. Specifically, no hemorrhage, hydrocephalus, mass lesion, acute infarction, or significant intracranial injury. Dural calcifications present. Vascular: No hyperdense vessel or unexpected calcification. Skull: No acute calvarial abnormality. Sinuses/Orbits: Visualized paranasal sinuses and mastoids clear. Orbital soft tissues unremarkable. Other: None IMPRESSION: No acute intracranial abnormality. Electronically Signed   By: Rolm Baptise M.D.   On: 03/08/2018 12:59       IMPRESSION/PLAN: 1. High-grade, ER PR negative DCIS of the right breast with concern for focal early invasion. Dr. Lisbeth Renshaw discusses the pathology findings and reviews the nature of noninvasive breast disease. The consensus from the breast conference includes breast conservation with lumpectomy with the option for sentinel lymph node biopsy. She would benefit from adjuvant radiotherapy.  Dr. Jana Hakim will weigh in on her final pathology to determine if there is a need for any additional tumor testing for decision-making about systemic therapy.  Regarding radiotherapy, we discussed the risks, benefits, short, and long term effects of radiotherapy, and the patient is interested in proceeding. Dr. Lisbeth Renshaw discusses the delivery and logistics of radiotherapy and anticipates a course of 4 or 6 1/2 weeks of radiotherapy. We will see her back about 2 weeks after surgery to discuss the simulation process and anticipate we starting radiotherapy about 4-6 weeks after surgery.   In a visit lasting 45 minutes, greater than 50% of the time was spent  face to face discussing her case, and coordinating the patient's  care.  The above documentation reflects my direct findings during this shared patient visit. Please see the separate note by Dr. Lisbeth Renshaw on this date for the remainder of the patient's plan of care.    Carola Rhine, PAC

## 2018-03-15 NOTE — Progress Notes (Signed)
Nutrition Assessment  Reason for Assessment:  Pt seen in Breast Clinic  ASSESSMENT:   67 year old female with new diagnosis of breast cancer.  Past medical history of DM, HTN  Patient reports normal appetite  Medications:  reviewed  Labs: reviewed  Anthropometrics:   Height: 63 inches Weight: 192 lb BMI: 34   NUTRITION DIAGNOSIS: Food and nutrition related knowledge deficit related to new diagnosis of breast cancer as evidenced by no prior need for nutrition related information.  INTERVENTION:   Discussed and provided packet of information regarding nutritional tips for breast cancer patients.  Questions answered.  Teachback method used.  Contact information provided and patient knows to contact me with questions/concerns.    MONITORING, EVALUATION, and GOAL: Pt will consume a healthy plant based diet to maintain lean body mass throughout treatment.   Chanon Loney B. Zenia Resides, [place], [place] Registered Dietitian [phone] (pager)

## 2018-03-16 MED FILL — LORazepam 0.5 MG TABS: 0.5 | 10 days supply | Qty: 30 | Fill #0

## 2018-03-17 ENCOUNTER — Ambulatory Visit
Admission: RE | Admit: 2018-03-17 | Discharge: 2018-03-17 | Disposition: A | Discharge status: Home / Self Care | Payer: Medicare Other | Source: Ambulatory Visit | Attending: General Surgery | Admitting: General Surgery

## 2018-03-17 ENCOUNTER — Encounter: Payer: Self-pay | Admitting: Family Medicine

## 2018-03-17 DIAGNOSIS — D0511 Intraductal carcinoma in situ of right breast: Secondary | ICD-10-CM

## 2018-03-17 DIAGNOSIS — D0512 Intraductal carcinoma in situ of left breast: Secondary | ICD-10-CM | POA: Diagnosis not present

## 2018-03-17 MED ORDER — GADOBENATE DIMEGLUMINE 529 MG/ML IV SOLN
18.0000 mL | Freq: Once | INTRAVENOUS | Status: AC | PRN
  Administered 2018-03-17: 18 mL via INTRAVENOUS

## 2018-03-17 NOTE — Progress Notes (Signed)
[name] Psychosocial Distress Screening Spiritual Care  Met with [name] in [facility] to introduce [name] team/resources, reviewing distress screen per protocol.  The patient scored a 0 on the Psychosocial Distress Thermometer which indicates no distress. Also assessed for distress and other psychosocial needs.   ONCBCN DISTRESS SCREENING 03/17/2018  Screening Type Initial Screening  Distress experienced in past week (1-10) 0  Referral to support programs Yes   The patient presented to Breast Clinic with her daughter, [name]. The patient expressed that she has experienced no distress because she feels informed and well supported by her treatment team. The patient also expressed relief that her cancer was caught early. The patient shared that her faith and family have been helpful supports. The patient reported that she is not interested in Patient and Family Support resources at this time, but she will reach out if any needs arise.  Follow up needed: No.  [name], Counseling Intern [phone]

## 2018-03-20 ENCOUNTER — Other Ambulatory Visit: Payer: Self-pay | Admitting: General Surgery

## 2018-03-20 DIAGNOSIS — C50211 Malignant neoplasm of upper-inner quadrant of right female breast: Secondary | ICD-10-CM

## 2018-03-23 ENCOUNTER — Ambulatory Visit
Admission: RE | Admit: 2018-03-23 | Discharge: 2018-03-23 | Disposition: A | Discharge status: Home / Self Care | Payer: Medicare Other | Source: Ambulatory Visit | Attending: General Surgery | Admitting: General Surgery

## 2018-03-23 ENCOUNTER — Other Ambulatory Visit: Payer: Medicare Other

## 2018-03-23 DIAGNOSIS — D0512 Intraductal carcinoma in situ of left breast: Secondary | ICD-10-CM | POA: Diagnosis not present

## 2018-03-23 DIAGNOSIS — C50211 Malignant neoplasm of upper-inner quadrant of right female breast: Secondary | ICD-10-CM

## 2018-03-23 DIAGNOSIS — N6311 Unspecified lump in the right breast, upper outer quadrant: Secondary | ICD-10-CM | POA: Diagnosis not present

## 2018-03-23 DIAGNOSIS — N6322 Unspecified lump in the left breast, upper inner quadrant: Secondary | ICD-10-CM | POA: Diagnosis not present

## 2018-03-23 DIAGNOSIS — N6321 Unspecified lump in the left breast, upper outer quadrant: Secondary | ICD-10-CM | POA: Diagnosis not present

## 2018-03-23 DIAGNOSIS — N6320 Unspecified lump in the left breast, unspecified quadrant: Secondary | ICD-10-CM | POA: Diagnosis not present

## 2018-03-23 DIAGNOSIS — N6091 Unspecified benign mammary dysplasia of right breast: Secondary | ICD-10-CM | POA: Diagnosis not present

## 2018-03-23 HISTORY — PX: BREAST BIOPSY: SHX20

## 2018-03-24 ENCOUNTER — Telehealth: Payer: Self-pay | Admitting: *Deleted

## 2018-03-24 NOTE — Telephone Encounter (Signed)
  Oncology Nurse Navigator Documentation  Navigator Location: [facility] (03/24/18 1200)   )Navigator Encounter Type: Telephone;[facility] Follow-up (03/24/18 1200) Telephone: Outgoing Call;Clinic/[facility] Follow-up (03/24/18 1200)                                                  Time Spent with Patient: 15 (03/24/18 1200)

## 2018-03-27 ENCOUNTER — Other Ambulatory Visit: Payer: Self-pay | Admitting: General Surgery

## 2018-03-27 DIAGNOSIS — R9389 Abnormal findings on diagnostic imaging of other specified body structures: Secondary | ICD-10-CM

## 2018-03-29 ENCOUNTER — Other Ambulatory Visit: Payer: Self-pay | Admitting: Family Medicine

## 2018-03-29 MED FILL — metFORMIN HCL 1000 MG TABS: 1000 | 30 days supply | Qty: 75 | Fill #1

## 2018-03-29 MED FILL — INVOKANA 100 MG TABLET: 100 | 30 days supply | Qty: 30 | Fill #1

## 2018-03-30 MED FILL — OMEPRAZOLE 40 MG CPDR: 40 | 90 days supply | Qty: 180 | Fill #0

## 2018-04-03 ENCOUNTER — Ambulatory Visit
Admission: RE | Admit: 2018-04-03 | Discharge: 2018-04-03 | Disposition: A | Discharge status: Home / Self Care | Payer: Medicare Other | Source: Ambulatory Visit | Attending: General Surgery | Admitting: General Surgery

## 2018-04-03 ENCOUNTER — Other Ambulatory Visit: Payer: Self-pay | Admitting: General Surgery

## 2018-04-03 DIAGNOSIS — R9389 Abnormal findings on diagnostic imaging of other specified body structures: Secondary | ICD-10-CM

## 2018-04-03 DIAGNOSIS — D0501 Lobular carcinoma in situ of right breast: Secondary | ICD-10-CM | POA: Diagnosis not present

## 2018-04-03 DIAGNOSIS — N6311 Unspecified lump in the right breast, upper outer quadrant: Secondary | ICD-10-CM | POA: Diagnosis not present

## 2018-04-03 DIAGNOSIS — R928 Other abnormal and inconclusive findings on diagnostic imaging of breast: Secondary | ICD-10-CM | POA: Diagnosis not present

## 2018-04-03 HISTORY — PX: BREAST BIOPSY: SHX20

## 2018-04-03 MED ORDER — GADOBENATE DIMEGLUMINE 529 MG/ML IV SOLN
18.0000 mL | Freq: Once | INTRAVENOUS | Status: AC | PRN
  Administered 2018-04-03: 18 mL via INTRAVENOUS

## 2018-04-05 DIAGNOSIS — Z6834 Body mass index (BMI) 34.0-34.9, adult: Secondary | ICD-10-CM | POA: Diagnosis not present

## 2018-04-05 DIAGNOSIS — Z01419 Encounter for gynecological examination (general) (routine) without abnormal findings: Secondary | ICD-10-CM | POA: Diagnosis not present

## 2018-04-06 ENCOUNTER — Telehealth: Payer: Self-pay | Admitting: Family Medicine

## 2018-04-06 MED ORDER — GLUCOSE BLOOD VI STRP: ORAL_STRIP | 5 refills | Status: DC

## 2018-04-06 NOTE — Telephone Encounter (Signed)
[facility] calling back to advise even though this was resent, due to Medicare requirements, scripts for test strips must have a dx code written on the prescription. Please resend. Thanks.  [id number] [place], [place] (Phone) [phone] (Fax)

## 2018-04-06 NOTE — Telephone Encounter (Signed)
Done

## 2018-04-06 NOTE — Telephone Encounter (Signed)
Copied from [place] [id number]. Topic: Quick Communication - Rx Refill/Question >> Apr 06, 2018  9:26 AM Bea Graff, NT wrote: Medication: CONTOUR NEXT TEST test strip   Has the patient contacted their pharmacy? Yes.   (Agent: If no, request that the patient contact the pharmacy for the refill.) (Agent: If yes, when and what did the pharmacy advise?)  Preferred Pharmacy (with phone number or street name): [id number] [place], [place] [id number] (Phone) [phone] (Fax)    Agent: Please be advised that RX refills may take up to 3 business days. We ask that you follow-up with your pharmacy.

## 2018-04-06 NOTE — Telephone Encounter (Signed)
[facility] calling back to advise even though this was resent, due to Medicare requirements, scripts for test strips must have a dx code written on the prescription. Contour Next Test Strips Please resend. Thanks.  [id number] [place], [place] (Phone) [phone] (Fax)

## 2018-04-06 NOTE — Addendum Note (Signed)
Addended by: [name] A on: 04/06/2018 03:08 PM   Modules accepted: Orders

## 2018-04-12 ENCOUNTER — Other Ambulatory Visit: Payer: Self-pay | Admitting: Family Medicine

## 2018-04-14 DIAGNOSIS — C801 Malignant (primary) neoplasm, unspecified: Secondary | ICD-10-CM

## 2018-04-14 HISTORY — DX: Malignant (primary) neoplasm, unspecified: C80.1

## 2018-04-14 HISTORY — PX: BREAST LUMPECTOMY: SHX2

## 2018-04-14 MED FILL — BISOPROLOL-HCTZ 5-6.25 MG T: 5-6.25 | 90 days supply | Qty: 90 | Fill #0

## 2018-04-17 ENCOUNTER — Other Ambulatory Visit: Payer: Self-pay | Admitting: General Surgery

## 2018-04-17 DIAGNOSIS — C50412 Malignant neoplasm of upper-outer quadrant of left female breast: Secondary | ICD-10-CM | POA: Diagnosis not present

## 2018-04-17 DIAGNOSIS — D0501 Lobular carcinoma in situ of right breast: Secondary | ICD-10-CM | POA: Diagnosis not present

## 2018-04-17 DIAGNOSIS — R928 Other abnormal and inconclusive findings on diagnostic imaging of breast: Secondary | ICD-10-CM

## 2018-04-17 DIAGNOSIS — C50211 Malignant neoplasm of upper-inner quadrant of right female breast: Secondary | ICD-10-CM

## 2018-04-17 DIAGNOSIS — Z171 Estrogen receptor negative status [ER-]: Secondary | ICD-10-CM

## 2018-04-17 MED ORDER — CIPROFLOXACIN IN D5W 400 MG/200ML IV SOLN: 400.0000 mg | INTRAVENOUS | Status: AC

## 2018-04-17 MED ORDER — ACETAMINOPHEN 500 MG PO TABS: 1000.0000 mg | ORAL_TABLET | ORAL | Status: AC

## 2018-04-17 MED ORDER — CHLORHEXIDINE GLUCONATE CLOTH 2 % EX PADS: 6.0000 | MEDICATED_PAD | Freq: Once | CUTANEOUS | Status: AC

## 2018-04-17 MED ORDER — GABAPENTIN 100 MG PO CAPS: 200.0000 mg | ORAL_CAPSULE | ORAL | Status: AC

## 2018-04-18 ENCOUNTER — Other Ambulatory Visit: Payer: Self-pay | Admitting: General Surgery

## 2018-04-18 ENCOUNTER — Encounter: Payer: Self-pay | Admitting: *Deleted

## 2018-04-18 DIAGNOSIS — C50412 Malignant neoplasm of upper-outer quadrant of left female breast: Secondary | ICD-10-CM

## 2018-04-18 DIAGNOSIS — C50211 Malignant neoplasm of upper-inner quadrant of right female breast: Secondary | ICD-10-CM

## 2018-04-18 DIAGNOSIS — D0511 Intraductal carcinoma in situ of right breast: Secondary | ICD-10-CM

## 2018-04-18 DIAGNOSIS — Z171 Estrogen receptor negative status [ER-]: Principal | ICD-10-CM

## 2018-04-25 ENCOUNTER — Encounter (HOSPITAL_BASED_OUTPATIENT_CLINIC_OR_DEPARTMENT_OTHER): Payer: Self-pay | Admitting: *Deleted

## 2018-04-25 ENCOUNTER — Other Ambulatory Visit: Payer: Self-pay

## 2018-04-26 ENCOUNTER — Encounter (HOSPITAL_BASED_OUTPATIENT_CLINIC_OR_DEPARTMENT_OTHER)
Admission: RE | Admit: 2018-04-26 | Discharge: 2018-04-26 | Disposition: A | Discharge status: Home / Self Care | Payer: Medicare Other | Source: Ambulatory Visit | Attending: General Surgery | Admitting: General Surgery

## 2018-04-26 DIAGNOSIS — Z01812 Encounter for preprocedural laboratory examination: Secondary | ICD-10-CM | POA: Insufficient documentation

## 2018-04-26 LAB — BASIC METABOLIC PANEL
ANION GAP: 11 (ref 5–15)
BUN: 15 mg/dL (ref 8–23)
CALCIUM: 9.6 mg/dL (ref 8.9–10.3)
CO2: 24 mmol/L (ref 22–32)
CREATININE: 0.94 mg/dL (ref 0.44–1.00)
Chloride: 104 mmol/L (ref 98–111)
Glucose, Bld: 151 mg/dL — ABNORMAL HIGH (ref 70–99)
Potassium: 4.5 mmol/L (ref 3.5–5.1)
Sodium: 139 mmol/L (ref 135–145)

## 2018-04-26 NOTE — Progress Notes (Signed)
Ensure pre surgery drink given with instructions to complete by 0800 dos, surgical soap given with instructions, pt verbalized understanding. 

## 2018-04-27 ENCOUNTER — Ambulatory Visit
Admission: RE | Admit: 2018-04-27 | Discharge: 2018-04-27 | Disposition: A | Discharge status: Home / Self Care | Payer: Medicare Other | Source: Ambulatory Visit | Attending: General Surgery | Admitting: General Surgery

## 2018-04-27 ENCOUNTER — Other Ambulatory Visit: Payer: Self-pay | Admitting: General Surgery

## 2018-04-27 DIAGNOSIS — C50412 Malignant neoplasm of upper-outer quadrant of left female breast: Secondary | ICD-10-CM

## 2018-04-27 DIAGNOSIS — Z171 Estrogen receptor negative status [ER-]: Principal | ICD-10-CM

## 2018-04-27 DIAGNOSIS — R928 Other abnormal and inconclusive findings on diagnostic imaging of breast: Secondary | ICD-10-CM

## 2018-04-27 DIAGNOSIS — N62 Hypertrophy of breast: Secondary | ICD-10-CM | POA: Diagnosis not present

## 2018-04-27 DIAGNOSIS — D0512 Intraductal carcinoma in situ of left breast: Secondary | ICD-10-CM | POA: Diagnosis not present

## 2018-04-27 DIAGNOSIS — D0511 Intraductal carcinoma in situ of right breast: Secondary | ICD-10-CM | POA: Diagnosis not present

## 2018-04-27 DIAGNOSIS — C50211 Malignant neoplasm of upper-inner quadrant of right female breast: Secondary | ICD-10-CM

## 2018-05-01 ENCOUNTER — Other Ambulatory Visit: Payer: Self-pay | Admitting: Family Medicine

## 2018-05-01 MED FILL — metFORMIN HCL 1000 MG TABS: 1000 | 30 days supply | Qty: 75 | Fill #0

## 2018-05-01 MED FILL — INVOKANA 100 MG TABLET: 100 | 30 days supply | Qty: 30 | Fill #2

## 2018-05-01 NOTE — H&P (Signed)
[name] Documented: 04/17/2018 4:04 PM Location: [facility] Patient #: [phone] DOB: 08-06-1950 Married / Language: English / Race: Black or African American Female   History of Present Illness Christine Klein MD; 04/17/2018 5:00 PM) The patient is a 67 year old female who presents for a follow-up for Breast cancer. Pt is a 67 yo F who is referred for a diagnosis of right breast cancer 02/2018. She had screening detected calcifications of 1.7 cm in size from 12-1 o'clock. This was notable as she has had many calcifications that have been unchanged. She had this small area that looked different. Biopsy was performed and showed high grade DCIS, ER/PR negative with suspicion for microinvasion. Breast density was category C. She has no personal history of cancer, but her her mother had breast cancer at age 27 and her paternal aunt also had breast cancer.   She has been getting regular mammograms. She had menarche at age 77. She had surgical menopause at age 80. She used HRT for 3 years, but isn't any longer. She is a G2P2 wtih first child at age 37. She used hormonal contraception at age 45. She is up to date with colonoscopy and bone density.    Unfortunately, she had a breast MRI and was found to have multiple other lesions that required biopsy. Ultimately, she had a left sided DCIS that is hormone receptor negative (12 o'clock, 5 mm) and several right sided lesions. She had a two areas of LCIS with one in the upper inner quadrant and upper outer quadrant. Additionally, one in the upper outer quadrant was a complex sclerosing lesion.    Pathology 03/07/2018 Breast, right, needle core biopsy, calcs 12 o'clock middle depth 8 cm fn - DUCTAL CARCINOMA WITH CALCIFICATIONS. Microscopic Comment The carcinoma is high grade and the vast majority is in situ. However, there are foci suspicious for early stromal invasion.  Films are from [facility]. reports and images reviewed.    CBC normal CMET showed glucose 164, Elevated AST and ALT. Cr s. elevated at 1.12.    Allergies Emeline Gins, [place]; 04/17/2018 4:06 PM) Pravastatin Sodium *ANTIHYPERLIPIDEMICS*  Fatigue, memory loss, myalgia Amoxicillin *PENICILLINS*  Itching. Atorvastatin Calcium *ANTIHYPERLIPIDEMICS*  Myalgia, fatigue, memory loss Codeine Phosphate *ANALGESICS - OPIOID*  Nausea, Vomiting. Allergies Reconciled   Medication History Emeline Gins, CMA; 04/17/2018 4:06 PM) Valium (5MG  Tablet, Oral) Active. Antivert (25MG  Tablet, Oral) Active. Zofran (8MG  Tablet, Oral) Active. Invokana (100MG  Tablet, Oral) Active. Januvia (100MG  Tablet, Oral) Active. metFORMIN HCl (500MG  Tablet, Oral) Active. PriLOSEC OTC (20MG  Tablet DR, Oral) Active. Crestor (5MG  Tablet, Oral) Active. Aspirin (81MG  Tablet DR, Oral) Active. Medications Reconciled    Review of Systems Christine Klein MD; 04/17/2018 5:00 PM) All other systems negative  Vitals Emeline Gins CMA; 04/17/2018 4:06 PM) 04/17/2018 4:05 PM Weight: 191 lb Height: 63in Body Surface Area: 1.9 m Body Mass Index: 33.83 kg/m  Temp.: 52F  Pulse: 93 (Regular)  BP: 122/82 (Sitting, Left Arm, Standard)       Physical Exam Christine Klein MD; 04/17/2018 5:01 PM) General Mental Status-Alert. General Appearance-Consistent with stated age. Hydration-Well hydrated. Voice-Normal.  Head and Neck Head-normocephalic, atraumatic with no lesions or palpable masses.  Eye Sclera/Conjunctiva - Bilateral-No scleral icterus.  Chest and Lung Exam Chest and lung exam reveals -quiet, even and easy respiratory effort with no use of accessory muscles. Inspection Chest Wall - Normal. Back - normal.  Breast Note: right breast with a lateral hematoma and tenderness. no other palpable masses.   Cardiovascular  Cardiovascular examination reveals -normal pedal pulses bilaterally. Note: regular rate and  rhythm  Abdomen Inspection-Inspection Normal. Palpation/Percussion Palpation and Percussion of the abdomen reveal - Soft, Non Tender, No Rebound tenderness, No Rigidity (guarding) and No hepatosplenomegaly.  Peripheral Vascular Upper Extremity Inspection - Bilateral - Normal - No Clubbing, No Cyanosis, No Edema, Pulses Intact. Lower Extremity Palpation - Edema - Bilateral - No edema.  Neurologic Neurologic evaluation reveals -alert and oriented x 3 with no impairment of recent or remote memory. Mental Status-Normal.  Musculoskeletal Global Assessment -Note: no gross deformities.  Normal Exam - Left-Upper Extremity Strength Normal and Lower Extremity Strength Normal. Normal Exam - Right-Upper Extremity Strength Normal and Lower Extremity Strength Normal.  Lymphatic Head & Neck  General Head & Neck Lymphatics: Bilateral - Description - Normal. Axillary  General Axillary Region: Bilateral - Description - Normal. Tenderness - Non Tender.    Assessment & Plan Christine Klein MD; 04/17/2018 5:08 PM) MALIGNANT NEOPLASM OF UPPER-INNER QUADRANT OF RIGHT BREAST IN FEMALE, ESTROGEN RECEPTOR NEGATIVE (C50.211) Impression: Had discussion with patient and family regarding complexity of right breast. She still desires breast conservation if at all possible. I also discussed the potential for invasive disease on the right based on the MRI findings.  Will plan lumpectomy of the dcis and the CSL on the right. I discussed that we could consider SLN biopsy as there is a reasonable likelihood of invasive cancer, but she would prefer to wait and do second surgery if invasive disease is found.  I also discussed that there is at least a 10% risk of positive margins that may require reexcision. MALIGNANT NEOPLASM OF UPPER-OUTER QUADRANT OF LEFT BREAST IN FEMALE, ESTROGEN RECEPTOR NEGATIVE (C50.412) Impression: Will plan seed localized lumpectomy on the left. Unlikely to have invasive  disease on this side. Current Plans Pt Education - [facility] breast cancer surgery: discussed with patient and provided information. Schedule for Surgery LOBULAR CARCINOMA IN SITU (LCIS) OF RIGHT BREAST (D05.01) Impression: Pt to discuss pros and cons of antihormonal prophylactic therapy with oncology after all path is done. Her DCIS is hormone negative, but traditionally it has been recommended to take prophylactic tx for LCIS.    Signed by Christine Klein, MD (04/17/2018 5:09 PM)

## 2018-05-01 NOTE — H&P (View-Only) (Signed)
[name] Documented: 04/17/2018 4:04 PM Location: [facility] Patient #: [id number] DOB: Jan 13, 1951 Married / Language: English / Race: Black or African American Female   History of Present Illness Christine Klein MD; 04/17/2018 5:00 PM) The patient is a 67 year old female who presents for a follow-up for Breast cancer. Pt is a 67 yo F who is referred for a diagnosis of right breast cancer 02/2018. She had screening detected calcifications of 1.7 cm in size from 12-1 o'clock. This was notable as she has had many calcifications that have been unchanged. She had this small area that looked different. Biopsy was performed and showed high grade DCIS, ER/PR negative with suspicion for microinvasion. Breast density was category C. She has no personal history of cancer, but her her mother had breast cancer at age 34 and her paternal aunt also had breast cancer.   She has been getting regular mammograms. She had menarche at age 83. She had surgical menopause at age 75. She used HRT for 3 years, but isn't any longer. She is a G2P2 wtih first child at age 84. She used hormonal contraception at age 26. She is up to date with colonoscopy and bone density.    Unfortunately, she had a breast MRI and was found to have multiple other lesions that required biopsy. Ultimately, she had a left sided DCIS that is hormone receptor negative (12 o'clock, 5 mm) and several right sided lesions. She had a two areas of LCIS with one in the upper inner quadrant and upper outer quadrant. Additionally, one in the upper outer quadrant was a complex sclerosing lesion.    Pathology 03/07/2018 Breast, right, needle core biopsy, calcs 12 o'clock middle depth 8 cm fn - DUCTAL CARCINOMA WITH CALCIFICATIONS. Microscopic Comment The carcinoma is high grade and the vast majority is in situ. However, there are foci suspicious for early stromal invasion.  Films are from [facility]. reports and images reviewed.    CBC normal CMET showed glucose 164, Elevated AST and ALT. Cr s. elevated at 1.12.    Allergies Emeline Gins, [place]; 04/17/2018 4:06 PM) Pravastatin Sodium *ANTIHYPERLIPIDEMICS*  Fatigue, memory loss, myalgia Amoxicillin *PENICILLINS*  Itching. Atorvastatin Calcium *ANTIHYPERLIPIDEMICS*  Myalgia, fatigue, memory loss Codeine Phosphate *ANALGESICS - OPIOID*  Nausea, Vomiting. Allergies Reconciled   Medication History Emeline Gins, CMA; 04/17/2018 4:06 PM) Valium (5MG  Tablet, Oral) Active. Antivert (25MG  Tablet, Oral) Active. Zofran (8MG  Tablet, Oral) Active. Invokana (100MG  Tablet, Oral) Active. Januvia (100MG  Tablet, Oral) Active. metFORMIN HCl (500MG  Tablet, Oral) Active. PriLOSEC OTC (20MG  Tablet DR, Oral) Active. Crestor (5MG  Tablet, Oral) Active. Aspirin (81MG  Tablet DR, Oral) Active. Medications Reconciled    Review of Systems Christine Klein MD; 04/17/2018 5:00 PM) All other systems negative  Vitals Emeline Gins CMA; 04/17/2018 4:06 PM) 04/17/2018 4:05 PM Weight: 191 lb Height: 63in Body Surface Area: 1.9 m Body Mass Index: 33.83 kg/m  Temp.: 8F  Pulse: 93 (Regular)  BP: 122/82 (Sitting, Left Arm, Standard)       Physical Exam Christine Klein MD; 04/17/2018 5:01 PM) General Mental Status-Alert. General Appearance-Consistent with stated age. Hydration-Well hydrated. Voice-Normal.  Head and Neck Head-normocephalic, atraumatic with no lesions or palpable masses.  Eye Sclera/Conjunctiva - Bilateral-No scleral icterus.  Chest and Lung Exam Chest and lung exam reveals -quiet, even and easy respiratory effort with no use of accessory muscles. Inspection Chest Wall - Normal. Back - normal.  Breast Note: right breast with a lateral hematoma and tenderness. no other palpable masses.   Cardiovascular  Cardiovascular examination reveals -normal pedal pulses bilaterally. Note: regular rate and  rhythm  Abdomen Inspection-Inspection Normal. Palpation/Percussion Palpation and Percussion of the abdomen reveal - Soft, Non Tender, No Rebound tenderness, No Rigidity (guarding) and No hepatosplenomegaly.  Peripheral Vascular Upper Extremity Inspection - Bilateral - Normal - No Clubbing, No Cyanosis, No Edema, Pulses Intact. Lower Extremity Palpation - Edema - Bilateral - No edema.  Neurologic Neurologic evaluation reveals -alert and oriented x 3 with no impairment of recent or remote memory. Mental Status-Normal.  Musculoskeletal Global Assessment -Note: no gross deformities.  Normal Exam - Left-Upper Extremity Strength Normal and Lower Extremity Strength Normal. Normal Exam - Right-Upper Extremity Strength Normal and Lower Extremity Strength Normal.  Lymphatic Head & Neck  General Head & Neck Lymphatics: Bilateral - Description - Normal. Axillary  General Axillary Region: Bilateral - Description - Normal. Tenderness - Non Tender.    Assessment & Plan Christine Klein MD; 04/17/2018 5:08 PM) MALIGNANT NEOPLASM OF UPPER-INNER QUADRANT OF RIGHT BREAST IN FEMALE, ESTROGEN RECEPTOR NEGATIVE (C50.211) Impression: Had discussion with patient and family regarding complexity of right breast. She still desires breast conservation if at all possible. I also discussed the potential for invasive disease on the right based on the MRI findings.  Will plan lumpectomy of the dcis and the CSL on the right. I discussed that we could consider SLN biopsy as there is a reasonable likelihood of invasive cancer, but she would prefer to wait and do second surgery if invasive disease is found.  I also discussed that there is at least a 10% risk of positive margins that may require reexcision. MALIGNANT NEOPLASM OF UPPER-OUTER QUADRANT OF LEFT BREAST IN FEMALE, ESTROGEN RECEPTOR NEGATIVE (C50.412) Impression: Will plan seed localized lumpectomy on the left. Unlikely to have invasive  disease on this side. Current Plans Pt Education - [facility] breast cancer surgery: discussed with patient and provided information. Schedule for Surgery LOBULAR CARCINOMA IN SITU (LCIS) OF RIGHT BREAST (D05.01) Impression: Pt to discuss pros and cons of antihormonal prophylactic therapy with oncology after all path is done. Her DCIS is hormone negative, but traditionally it has been recommended to take prophylactic tx for LCIS.    Signed by Christine Klein, MD (04/17/2018 5:09 PM)

## 2018-05-01 NOTE — H&P (Deleted)
[name] Documented: 04/20/2018 2:40 PM Location: [place] [facility] Patient #: [id number] DOB: 11/01/1939 Married / Language: [name] / Race: White Female   History of Present Illness [name] MD; 04/21/2018 12:52 PM) The patient is a 67 year old female who presents with pancreatic cancer. Patient is a 67 year old female who presents with incidentally detected pancreatic cancer. The patient has had around 25 pound weight loss. He also presented with some dyspepsia and fatigue as well as intermittent nausea. He had an ultrasound that showed cholelithiasis. He was scheduled to have an EGD. In September he had a pneumonia and ended up with a CT scan to evaluate his pneumonia. The CT scan showed an abnormality of the pancreas and he subsequently had an MRI. This did show an abrupt stop of the pancreatic duct and suspicion for mass int he tail. EUS confirmed mass with a 2.8 cm pancreatic tail mass seen that invades the splenic artery and vein. He had lymph nodes seen in the peripancreatic region both on endoscopic ultrasound and on MRI. At this point, he states that he does not feel too poorly. He is able to eat. His weight is stable. He does have a history of atrial fibrillation and is on Eliquis for this. Dr. Rayann Heman and Dr. Stanford Breed are his cardiologist. Additionally, he has a history of prostate cancer and papillary thyroid cancer. He has seen Dr. Burr Medico.   EUS 04/13/18 1. An irregular mass was identified in the pancreatic tail. The mass was hypoechoic and heterogenous.  The mass measured 28 mm in maximal cross-sectional diameter and it causes obstruction, dilation of the main pancreatic duct (76mm in the tail). The mass clearly involves the splenic artery and vein. The outer margins were irregular. Fine needle aspiration for cytology was performed. Color Doppler imaging was utilized prior to needle puncture to confirm a lack of significant vascular structures within the needle  path. Three passes were made with the 22 gauge needle using a transgastric approach. A cytotechnologist was present to evaluate the adequacy of the specimen. 2. Two small, round, suspicious peripancreatic lymphnodes (6-63mm each) lay close to the primary tumor. 3. I was unable to visualize the head, uncinate pancreas very well (could not advance the large diameter echoendoscopes past the duodenal bulb). 4. Limited views of the liver, spleen were all normal.  cytology 04/13/2018 FINE NEEDLE ASPIRATION, ENDOSCOPIC, PANCREAS TAIL(SPECIMEN 1 OF 1 COLLECTED 04/13/18): MALIGNANT CELLS CONSISTENT WITH ADENOCARCINOMA.  CBC, BMET essentially normal 04/17/18, no LFTs available.    Past Surgical History (April Staton, [place]; 04/20/2018 2:40 PM) Aneurysm Repair  Appendectomy  Cataract Surgery  Left. Colon Polyp Removal - Colonoscopy  Thyroid Surgery   Diagnostic Studies History (April Staton, CMA; 04/20/2018 2:40 PM) Colonoscopy  1-5 years ago  Allergies (April Staton, CMA; 04/20/2018 2:46 PM) Amiodarone HCl *ANTIARRHYTHMICS*  Aspirin *ANALGESICS - NonNarcotic*  Codeine Phosphate *ANALGESICS - OPIOID*  Contrast Allergy PreMed Pack *CORTICOSTEROIDS*  Iodinated Diagnostic Agents  Norpace *ANTIARRHYTHMICS*  Procaine HCl *LOCAL ANESTHETICS-Parenteral*   Medication History (April Staton, CMA; 04/20/2018 2:48 PM) Proventil HFA (108 (90 Base)MCG/ACT Aerosol Soln, Inhalation) Active. Eliquis (5MG  Tablet, Oral) Active. Fluticasone-Salmeterol (100-50MCG/DOSE Aero Pow Br Act, Inhalation) Active. Levothroid (125MCG Tablet, Oral) Active. Ondansetron (4MG  Tablet, Oral) Active. Famotidine (40MG  Tablet, Oral) Active. ALPRAZolam (0.25MG  Tablet, Oral) Active. Propranolol HCl (10MG  Tablet, Oral) Active. Medications Reconciled  Social History (April Staton, [place]; 04/20/2018 2:40 PM) Alcohol use  Remotely quit alcohol use. No caffeine use  No drug use  Tobacco use  Current  every day  smoker.  Family History (April Staton, [place]; 04/20/2018 2:40 PM) Arthritis  Mother. Diabetes Mellitus  Sister.  Other Problems (April Staton, CMA; 04/20/2018 2:40 PM) Atrial Fibrillation  Chronic Obstructive Lung Disease  Emphysema Of Lung  High blood pressure  Pancreatic Cancer  Prostate Cancer  Thyroid Cancer  Thyroid Disease     Review of Systems (April Staton CMA; 04/20/2018 2:41 PM) General Present- Appetite Loss. Not Present- Chills, Fatigue, Fever, Night Sweats, Weight Gain and Weight Loss. Skin Not Present- Change in Wart/Mole, Dryness, Hives, Jaundice, New Lesions, Non-Healing Wounds, Rash and Ulcer. HEENT Not Present- Earache, Hearing Loss, Hoarseness, Nose Bleed, Oral Ulcers, Ringing in the Ears, Seasonal Allergies, Sinus Pain, Sore Throat, Visual Disturbances, Wears glasses/contact lenses and Yellow Eyes. Respiratory Not Present- Bloody sputum, Chronic Cough, Difficulty Breathing, Snoring and Wheezing. Breast Not Present- Breast Mass, Breast Pain, Nipple Discharge and Skin Changes. Cardiovascular Not Present- Chest Pain, Difficulty Breathing Lying Down, Leg Cramps, Palpitations, Rapid Heart Rate, Shortness of Breath and Swelling of Extremities. Gastrointestinal Present- Abdominal Pain. Not Present- Bloating, Bloody Stool, Change in Bowel Habits, Chronic diarrhea, Constipation, Difficulty Swallowing, Excessive gas, Gets full quickly at meals, Hemorrhoids, Indigestion, Nausea, Rectal Pain and Vomiting. Female Genitourinary Present- Change in Urinary Stream. Not Present- Blood in Urine, Frequency, Impotence, Nocturia, Painful Urination, Urgency and Urine Leakage. Musculoskeletal Not Present- Back Pain, Joint Pain, Joint Stiffness, Muscle Pain, Muscle Weakness and Swelling of Extremities. Neurological Not Present- Decreased Memory, Fainting, Headaches, Numbness, Seizures, Tingling, Tremor, Trouble walking and Weakness. Psychiatric Present- Anxiety. Not Present- Bipolar,  Change in Sleep Pattern, Depression, Fearful and Frequent crying. Endocrine Not Present- Cold Intolerance, Excessive Hunger, Hair Changes, Heat Intolerance, Hot flashes and New Diabetes. Hematology Present- Blood Thinners and Easy Bruising. Not Present- Excessive bleeding, Gland problems, HIV and Persistent Infections.  Vitals (April Staton CMA; 04/20/2018 2:49 PM) 04/20/2018 2:48 PM Weight: 147.25 lb Height: 73in Body Surface Area: 1.89 m Body Mass Index: 19.43 kg/m  Temp.: 97.39F(Oral)  Pulse: 71 (Regular)  BP: 130/78 (Sitting, Left Arm, Standard)       Physical Exam [name] MD; 04/21/2018 12:52 PM) General Mental Status-Alert. General Appearance-Consistent with stated age. Hydration-Well hydrated. Voice-Normal.  Head and Neck Head-normocephalic, atraumatic with no lesions or palpable masses. Trachea-midline. Thyroid Gland Characteristics - normal size and consistency.  Eye Eyeball - Bilateral-Extraocular movements intact. Sclera/Conjunctiva - Bilateral-No scleral icterus.  Chest and Lung Exam Chest and lung exam reveals -quiet, even and easy respiratory effort with no use of accessory muscles and on auscultation, normal breath sounds, no adventitious sounds and normal vocal resonance. Inspection Chest Wall - Normal. Back - normal.  Cardiovascular Cardiovascular examination reveals -normal heart sounds, regular rate and rhythm with no murmurs and normal pedal pulses bilaterally.  Abdomen Inspection Inspection of the abdomen reveals - No Hernias. Palpation/Percussion Palpation and Percussion of the abdomen reveal - Soft, Non Tender, No Rebound tenderness, No Rigidity (guarding) and No hepatosplenomegaly. Auscultation Auscultation of the abdomen reveals - Bowel sounds normal.  Neurologic Neurologic evaluation reveals -alert and oriented x 3 with no impairment of recent or remote memory. Mental  Status-Normal.  Musculoskeletal Global Assessment -Note: no gross deformities.  Normal Exam - Left-Upper Extremity Strength Normal and Lower Extremity Strength Normal. Normal Exam - Right-Upper Extremity Strength Normal and Lower Extremity Strength Normal.  Lymphatic Head & Neck  General Head & Neck Lymphatics: Bilateral - Description - Normal. Axillary  General Axillary Region: Bilateral - Description - Normal. Tenderness - Non Tender. Femoral & Inguinal  Generalized Femoral & Inguinal Lymphatics: Bilateral - Description - No Generalized lymphadenopathy.    Assessment & Plan [name] MD; 04/21/2018 12:54 PM) CARCINOMA OF TAIL OF PANCREAS (C25.2) Impression: Patient appears to have a T2N1 adenocarcinoma of the pancreatic tail. Because of the lymph node involvement and the size of the tumor, I recommend neoadjuvant treatment. This would be a hand-assisted laparoscopic surgery, but still complications can occur which can delay adjuvant therapy. Whether he had chemotherapy before or after surgery, chemotherapy would be a part of his recommended treatment. He will require splenectomy due to the invasion of the splenic artery and vein. I discussed that with the patient. He will also need cardiac risk stratification.  I recommend port placement. I reviewed the anatomy of report with the patient as well as risks. I discussed the risk of pneumothorax, port malfunction, port malposition, and blood clot. He understands and wishes to proceed.  Additionally, I reviewed diagrams of anatomy regarding the pancreas. I reviewed surgery with the patient. Pancreatic surgery would be in the future, but we went ahead and discussed the risks of this. I also discussed incision location and time in the hospital as well as recovery. Current Plans You are being scheduled for surgery- Our schedulers will call you.  You should hear from our office's scheduling department within 5 working days about  the location, date, and time of surgery. We try to make accommodations for patient's preferences in scheduling surgery, but sometimes the OR schedule or the surgeon's schedule prevents [place] from making those accommodations.  If you have not heard from our office [phone]) in 5 working days, call the office and ask for your surgeon's nurse.  If you have other questions about your diagnosis, plan, or surgery, call the office and ask for your surgeon's nurse.  Pt Education - ccs port insertion education   Signed by [name], MD (04/21/2018 12:55 PM)

## 2018-05-02 ENCOUNTER — Ambulatory Visit (HOSPITAL_BASED_OUTPATIENT_CLINIC_OR_DEPARTMENT_OTHER): Payer: Medicare Other | Admitting: Anesthesiology

## 2018-05-02 ENCOUNTER — Other Ambulatory Visit: Payer: Self-pay | Admitting: General Surgery

## 2018-05-02 ENCOUNTER — Ambulatory Visit (HOSPITAL_COMMUNITY)
Admission: RE | Admit: 2018-05-02 | Discharge: 2018-05-02 | Disposition: A | Discharge status: Home / Self Care | Payer: Medicare Other | Source: Ambulatory Visit | Attending: General Surgery | Admitting: General Surgery

## 2018-05-02 ENCOUNTER — Ambulatory Visit
Admission: RE | Admit: 2018-05-02 | Discharge: 2018-05-02 | Disposition: A | Discharge status: Home / Self Care | Payer: Medicare Other | Source: Ambulatory Visit | Attending: General Surgery | Admitting: General Surgery

## 2018-05-02 ENCOUNTER — Ambulatory Visit
Admit: 2018-05-02 | Discharge: 2018-05-02 | Disposition: A | Discharge status: Home / Self Care | Payer: Medicare Other | Attending: General Surgery | Admitting: General Surgery

## 2018-05-02 ENCOUNTER — Encounter (HOSPITAL_BASED_OUTPATIENT_CLINIC_OR_DEPARTMENT_OTHER): Payer: Self-pay | Admitting: *Deleted

## 2018-05-02 ENCOUNTER — Encounter (HOSPITAL_BASED_OUTPATIENT_CLINIC_OR_DEPARTMENT_OTHER)
Admission: RE | Disposition: A | Discharge status: Home / Self Care | Payer: Self-pay | Source: Ambulatory Visit | Attending: General Surgery

## 2018-05-02 ENCOUNTER — Other Ambulatory Visit: Payer: Self-pay

## 2018-05-02 DIAGNOSIS — E119 Type 2 diabetes mellitus without complications: Secondary | ICD-10-CM | POA: Diagnosis not present

## 2018-05-02 DIAGNOSIS — D0591 Unspecified type of carcinoma in situ of right breast: Secondary | ICD-10-CM | POA: Insufficient documentation

## 2018-05-02 DIAGNOSIS — D242 Benign neoplasm of left breast: Secondary | ICD-10-CM | POA: Diagnosis not present

## 2018-05-02 DIAGNOSIS — G4730 Sleep apnea, unspecified: Secondary | ICD-10-CM | POA: Insufficient documentation

## 2018-05-02 DIAGNOSIS — I10 Essential (primary) hypertension: Secondary | ICD-10-CM | POA: Insufficient documentation

## 2018-05-02 DIAGNOSIS — C50211 Malignant neoplasm of upper-inner quadrant of right female breast: Secondary | ICD-10-CM

## 2018-05-02 DIAGNOSIS — N6091 Unspecified benign mammary dysplasia of right breast: Secondary | ICD-10-CM | POA: Diagnosis not present

## 2018-05-02 DIAGNOSIS — Z7982 Long term (current) use of aspirin: Secondary | ICD-10-CM | POA: Diagnosis not present

## 2018-05-02 DIAGNOSIS — D241 Benign neoplasm of right breast: Secondary | ICD-10-CM | POA: Insufficient documentation

## 2018-05-02 DIAGNOSIS — Z171 Estrogen receptor negative status [ER-]: Principal | ICD-10-CM

## 2018-05-02 DIAGNOSIS — Z803 Family history of malignant neoplasm of breast: Secondary | ICD-10-CM | POA: Insufficient documentation

## 2018-05-02 DIAGNOSIS — Z79899 Other long term (current) drug therapy: Secondary | ICD-10-CM | POA: Diagnosis not present

## 2018-05-02 DIAGNOSIS — Z7984 Long term (current) use of oral hypoglycemic drugs: Secondary | ICD-10-CM | POA: Diagnosis not present

## 2018-05-02 DIAGNOSIS — N6311 Unspecified lump in the right breast, upper outer quadrant: Secondary | ICD-10-CM | POA: Diagnosis not present

## 2018-05-02 DIAGNOSIS — C50412 Malignant neoplasm of upper-outer quadrant of left female breast: Secondary | ICD-10-CM

## 2018-05-02 DIAGNOSIS — D0511 Intraductal carcinoma in situ of right breast: Secondary | ICD-10-CM | POA: Diagnosis not present

## 2018-05-02 DIAGNOSIS — D0512 Intraductal carcinoma in situ of left breast: Secondary | ICD-10-CM | POA: Diagnosis not present

## 2018-05-02 DIAGNOSIS — N6342 Unspecified lump in left breast, subareolar: Secondary | ICD-10-CM | POA: Diagnosis not present

## 2018-05-02 DIAGNOSIS — N6315 Unspecified lump in the right breast, overlapping quadrants: Secondary | ICD-10-CM | POA: Diagnosis not present

## 2018-05-02 DIAGNOSIS — N6489 Other specified disorders of breast: Secondary | ICD-10-CM | POA: Diagnosis not present

## 2018-05-02 DIAGNOSIS — E782 Mixed hyperlipidemia: Secondary | ICD-10-CM | POA: Diagnosis not present

## 2018-05-02 HISTORY — PX: BREAST LUMPECTOMY WITH RADIOACTIVE SEED LOCALIZATION: SHX6424

## 2018-05-02 LAB — GLUCOSE, CAPILLARY
Glucose-Capillary: 211 mg/dL — ABNORMAL HIGH (ref 70–99)
Glucose-Capillary: 142 mg/dL — ABNORMAL HIGH (ref 70–99)

## 2018-05-02 SURGERY — BREAST LUMPECTOMY WITH RADIOACTIVE SEED LOCALIZATION
Anesthesia: General | Site: Breast | Laterality: Bilateral

## 2018-05-02 MED ORDER — ACETAMINOPHEN 500 MG PO TABS
ORAL_TABLET | ORAL | Status: AC
  Filled 2018-05-02: qty 2

## 2018-05-02 MED ORDER — FENTANYL CITRATE (PF) 100 MCG/2ML IJ SOLN
INTRAMUSCULAR | Status: AC
  Filled 2018-05-02: qty 2

## 2018-05-02 MED ORDER — ACETAMINOPHEN 500 MG PO TABS
1000.0000 mg | ORAL_TABLET | ORAL | Status: AC
  Administered 2018-05-02: 1000 mg via ORAL

## 2018-05-02 MED ORDER — FENTANYL CITRATE (PF) 100 MCG/2ML IJ SOLN
50.0000 ug | INTRAMUSCULAR | Status: AC | PRN
  Administered 2018-05-02 (×2): 25 ug via INTRAVENOUS
  Administered 2018-05-02: 50 ug via INTRAVENOUS
  Administered 2018-05-02: 25 ug via INTRAVENOUS

## 2018-05-02 MED ORDER — LIDOCAINE HCL (CARDIAC) PF 100 MG/5ML IV SOSY
PREFILLED_SYRINGE | INTRAVENOUS | Status: DC | PRN
  Administered 2018-05-02: 50 mg via INTRAVENOUS

## 2018-05-02 MED ORDER — PROPOFOL 10 MG/ML IV BOLUS
INTRAVENOUS | Status: DC | PRN
  Administered 2018-05-02: 140 mg via INTRAVENOUS

## 2018-05-02 MED ORDER — CIPROFLOXACIN IN D5W 400 MG/200ML IV SOLN
400.0000 mg | INTRAVENOUS | Status: AC
  Administered 2018-05-02: 400 mg via INTRAVENOUS

## 2018-05-02 MED ORDER — LIDOCAINE-EPINEPHRINE (PF) 1 %-1:200000 IJ SOLN
INTRAMUSCULAR | Status: DC | PRN
  Administered 2018-05-02: 23 mL

## 2018-05-02 MED ORDER — LACTATED RINGERS IV SOLN
INTRAVENOUS | Status: DC
  Administered 2018-05-02: 10:00:00 via INTRAVENOUS

## 2018-05-02 MED ORDER — OXYCODONE HCL 5 MG/5ML PO SOLN: 5.0000 mg | Freq: Once | ORAL | Status: DC | PRN

## 2018-05-02 MED ORDER — ONDANSETRON HCL 4 MG/2ML IJ SOLN
INTRAMUSCULAR | Status: AC
  Filled 2018-05-02: qty 2

## 2018-05-02 MED ORDER — DEXAMETHASONE SODIUM PHOSPHATE 10 MG/ML IJ SOLN
INTRAMUSCULAR | Status: AC
  Filled 2018-05-02: qty 1

## 2018-05-02 MED ORDER — GABAPENTIN 100 MG PO CAPS
ORAL_CAPSULE | ORAL | Status: AC
  Filled 2018-05-02: qty 2

## 2018-05-02 MED ORDER — KETOROLAC TROMETHAMINE 30 MG/ML IJ SOLN
INTRAMUSCULAR | Status: AC
  Filled 2018-05-02: qty 1

## 2018-05-02 MED ORDER — OXYCODONE HCL 5 MG PO TABS: 5.0000 mg | ORAL_TABLET | Freq: Once | ORAL | Status: DC | PRN

## 2018-05-02 MED ORDER — CHLORHEXIDINE GLUCONATE CLOTH 2 % EX PADS: 6.0000 | MEDICATED_PAD | Freq: Once | CUTANEOUS | Status: DC

## 2018-05-02 MED ORDER — MIDAZOLAM HCL 2 MG/2ML IJ SOLN
1.0000 mg | INTRAMUSCULAR | Status: DC | PRN
  Administered 2018-05-02: 2 mg via INTRAVENOUS

## 2018-05-02 MED ORDER — LIDOCAINE 2% (20 MG/ML) 5 ML SYRINGE
INTRAMUSCULAR | Status: AC
  Filled 2018-05-02: qty 5

## 2018-05-02 MED ORDER — FENTANYL CITRATE (PF) 100 MCG/2ML IJ SOLN
25.0000 ug | INTRAMUSCULAR | Status: DC | PRN
  Administered 2018-05-02 (×2): 50 ug via INTRAVENOUS

## 2018-05-02 MED ORDER — MEPERIDINE HCL 25 MG/ML IJ SOLN: 6.2500 mg | INTRAMUSCULAR | Status: DC | PRN

## 2018-05-02 MED ORDER — OXYCODONE HCL 5 MG PO TABS: 2.5000 mg | ORAL_TABLET | Freq: Four times a day (QID) | ORAL | 0 refills | Status: DC | PRN

## 2018-05-02 MED ORDER — GABAPENTIN 100 MG PO CAPS
200.0000 mg | ORAL_CAPSULE | ORAL | Status: AC
  Administered 2018-05-02: 200 mg via ORAL

## 2018-05-02 MED ORDER — CIPROFLOXACIN IN D5W 400 MG/200ML IV SOLN
INTRAVENOUS | Status: AC
  Filled 2018-05-02: qty 200

## 2018-05-02 MED ORDER — DEXAMETHASONE SODIUM PHOSPHATE 4 MG/ML IJ SOLN
INTRAMUSCULAR | Status: DC | PRN
  Administered 2018-05-02: 5 mg via INTRAVENOUS

## 2018-05-02 MED ORDER — MIDAZOLAM HCL 2 MG/2ML IJ SOLN
INTRAMUSCULAR | Status: AC
  Filled 2018-05-02: qty 2

## 2018-05-02 MED ORDER — PROPOFOL 10 MG/ML IV BOLUS
INTRAVENOUS | Status: AC
  Filled 2018-05-02: qty 20

## 2018-05-02 MED ORDER — SCOPOLAMINE 1 MG/3DAYS TD PT72: 1.0000 | MEDICATED_PATCH | Freq: Once | TRANSDERMAL | Status: DC | PRN

## 2018-05-02 MED ORDER — ONDANSETRON HCL 4 MG/2ML IJ SOLN
INTRAMUSCULAR | Status: DC | PRN
  Administered 2018-05-02: 4 mg via INTRAVENOUS

## 2018-05-02 MED FILL — oxyCODONE HCL 5 MG TABS: 5 | 5 days supply | Qty: 20 | Fill #0

## 2018-05-02 SURGICAL SUPPLY — 55 items
ADH SKN CLS APL DERMABOND .7 (GAUZE/BANDAGES/DRESSINGS) ×2
BINDER BREAST LRG (GAUZE/BANDAGES/DRESSINGS) IMPLANT
BINDER BREAST MEDIUM (GAUZE/BANDAGES/DRESSINGS) IMPLANT
BINDER BREAST XLRG (GAUZE/BANDAGES/DRESSINGS) IMPLANT
BINDER BREAST XXLRG (GAUZE/BANDAGES/DRESSINGS) ×1 IMPLANT
BLADE SURG 10 STRL SS (BLADE) ×2 IMPLANT
BLADE SURG 15 STRL LF DISP TIS (BLADE) IMPLANT
BLADE SURG 15 STRL SS (BLADE) ×2
CANISTER SUC SOCK COL 7IN (MISCELLANEOUS) IMPLANT
CANISTER SUCT 1200ML W/VALVE (MISCELLANEOUS) IMPLANT
CHLORAPREP W/TINT 26ML (MISCELLANEOUS) ×4 IMPLANT
CLIP VESOCCLUDE LG 6/CT (CLIP) ×3 IMPLANT
COVER BACK TABLE 60X90IN (DRAPES) ×2 IMPLANT
COVER MAYO STAND STRL (DRAPES) ×2 IMPLANT
COVER PROBE W GEL 5X96 (DRAPES) ×2 IMPLANT
COVER WAND RF STERILE (DRAPES) IMPLANT
DECANTER SPIKE VIAL GLASS SM (MISCELLANEOUS) IMPLANT
DERMABOND ADVANCED (GAUZE/BANDAGES/DRESSINGS) ×2
DERMABOND ADVANCED .7 DNX12 (GAUZE/BANDAGES/DRESSINGS) ×1 IMPLANT
DEVICE DUBIN W/COMP PLATE 8390 (MISCELLANEOUS) ×5 IMPLANT
DRAPE LAPAROSCOPIC ABDOMINAL (DRAPES) ×2 IMPLANT
DRAPE UTILITY XL STRL (DRAPES) ×2 IMPLANT
ELECT COATED BLADE 2.86 ST (ELECTRODE) ×2 IMPLANT
ELECT REM PT RETURN 9FT ADLT (ELECTROSURGICAL) ×2
ELECTRODE REM PT RTRN 9FT ADLT (ELECTROSURGICAL) ×1 IMPLANT
GAUZE SPONGE 4X4 12PLY STRL LF (GAUZE/BANDAGES/DRESSINGS) ×2 IMPLANT
GLOVE BIO SURGEON STRL SZ 6 (GLOVE) ×2 IMPLANT
GLOVE BIO SURGEON STRL SZ 6.5 (GLOVE) ×2 IMPLANT
GLOVE BIOGEL PI IND STRL 6.5 (GLOVE) ×1 IMPLANT
GLOVE BIOGEL PI IND STRL 7.0 (GLOVE) IMPLANT
GLOVE BIOGEL PI INDICATOR 6.5 (GLOVE) ×1
GLOVE BIOGEL PI INDICATOR 7.0 (GLOVE) ×2
GOWN STRL REUS W/ TWL LRG LVL3 (GOWN DISPOSABLE) ×1 IMPLANT
GOWN STRL REUS W/TWL 2XL LVL3 (GOWN DISPOSABLE) ×2 IMPLANT
GOWN STRL REUS W/TWL LRG LVL3 (GOWN DISPOSABLE) ×4
KIT MARKER MARGIN INK (KITS) ×2 IMPLANT
LIGHT WAVEGUIDE WIDE FLAT (MISCELLANEOUS) ×1 IMPLANT
NDL HYPO 25X1 1.5 SAFETY (NEEDLE) ×1 IMPLANT
NEEDLE HYPO 25X1 1.5 SAFETY (NEEDLE) ×2 IMPLANT
NS IRRIG 1000ML POUR BTL (IV SOLUTION) ×2 IMPLANT
PACK BASIN DAY SURGERY FS (CUSTOM PROCEDURE TRAY) ×2 IMPLANT
PENCIL BUTTON HOLSTER BLD 10FT (ELECTRODE) ×2 IMPLANT
SLEEVE SCD COMPRESS KNEE MED (MISCELLANEOUS) ×2 IMPLANT
SPONGE LAP 18X18 RF (DISPOSABLE) ×4 IMPLANT
STRIP CLOSURE SKIN 1/2X4 (GAUZE/BANDAGES/DRESSINGS) ×2 IMPLANT
SUT MNCRL AB 4-0 PS2 18 (SUTURE) ×3 IMPLANT
SUT SILK 2 0 SH (SUTURE) IMPLANT
SUT VIC AB 2-0 SH 27 (SUTURE) ×4
SUT VIC AB 2-0 SH 27XBRD (SUTURE) ×1 IMPLANT
SUT VIC AB 3-0 SH 27 (SUTURE) ×4
SUT VIC AB 3-0 SH 27X BRD (SUTURE) ×1 IMPLANT
SYR CONTROL 10ML LL (SYRINGE) ×2 IMPLANT
TOWEL GREEN STERILE FF (TOWEL DISPOSABLE) ×2 IMPLANT
TUBE CONNECTING 20X1/4 (TUBING) ×1 IMPLANT
YANKAUER SUCT BULB TIP NO VENT (SUCTIONS) ×1 IMPLANT

## 2018-05-02 NOTE — Interval H&P Note (Signed)
History and Physical Interval Note:  05/02/2018 12:09 PM  [name]  has presented today for surgery, with the diagnosis of BILATERAL BREAST CANCER  The various methods of treatment have been discussed with the patient and family. After consideration of risks, benefits and other options for treatment, the patient has consented to  Procedure(s): RIGHT BREAST SEED LUMPECTOMY X2 AND LEFT BREAST SEED GUIDED LUMPECTOMY (Bilateral) as a surgical intervention .  The patient's history has been reviewed, patient examined, no change in status, stable for surgery.  I have reviewed the patient's chart and labs.  Questions were answered to the patient's satisfaction.     [name]

## 2018-05-02 NOTE — Anesthesia Preprocedure Evaluation (Signed)
Anesthesia Evaluation  Patient identified by MRN, date of birth, ID band Patient awake    Reviewed: Allergy & Precautions, NPO status , Patient's Chart, lab work & pertinent test results  Airway Mallampati: II  TM Distance: >3 FB Neck ROM: Full    Dental  (+) Dental Advisory Given   Pulmonary sleep apnea ,    Pulmonary exam normal breath sounds clear to auscultation       Cardiovascular hypertension, Pt. on medications Normal cardiovascular exam+ dysrhythmias  Rhythm:Regular Rate:Normal     Neuro/Psych  Headaches,  Neuromuscular disease negative psych ROS   GI/Hepatic negative GI ROS, (+) Hepatitis -  Endo/Other  diabetes, Type 2  Renal/GU negative Renal ROS     Musculoskeletal  (+) Arthritis ,   Abdominal (+) + obese,   Peds  Hematology negative hematology ROS (+)   Anesthesia Other Findings   Reproductive/Obstetrics negative OB ROS                            Anesthesia Physical Anesthesia Plan  ASA: III  Anesthesia Plan: General   Post-op Pain Management:    Induction: Intravenous  PONV Risk Score and Plan: 4 or greater and Ondansetron, Dexamethasone and Midazolam  Airway Management Planned: LMA  Additional Equipment: None  Intra-op Plan:   Post-operative Plan: Extubation in OR  Informed Consent: I have reviewed the patients History and Physical, chart, labs and discussed the procedure including the risks, benefits and alternatives for the proposed anesthesia with the patient or authorized representative who has indicated his/her understanding and acceptance.   Dental advisory given  Plan Discussed with: CRNA  Anesthesia Plan Comments:         Anesthesia Quick Evaluation

## 2018-05-02 NOTE — Anesthesia Postprocedure Evaluation (Signed)
Anesthesia Post Note  Patient: [name]  Procedure(s) Performed: RIGHT BREAST SEED LUMPECTOMY X2 AND LEFT BREAST SEED GUIDED LUMPECTOMY (Bilateral Breast)     Patient location during evaluation: PACU Anesthesia Type: General Level of consciousness: sedated and patient cooperative Pain management: pain level controlled Vital Signs Assessment: post-procedure vital signs reviewed and stable Respiratory status: spontaneous breathing Cardiovascular status: stable Anesthetic complications: no    Last Vitals:  Vitals:   05/02/18 1557 05/02/18 1617  BP: 130/78 133/73  Pulse: 81 80  Resp: 18   Temp:  36.7 C  SpO2: 96% 96%    Last Pain:  Vitals:   05/02/18 1610  TempSrc:   PainSc: [place]

## 2018-05-02 NOTE — Op Note (Signed)
Right Breast Radioactive seed localized lumpectomy x 2, left seed localized lumpectomy  Indications: This patient presents with history of bilateral DCIS and right complex sclerosing lesion.  LCIS is also present.    Pre-operative Diagnosis: See above.  Post-operative Diagnosis: Same  Surgeon: [name]   Anesthesia: General endotracheal anesthesia  ASA Class: 3  Procedure Details  The patient was seen in the Holding Room. The risks, benefits, complications, treatment options, and expected outcomes were discussed with the patient. The possibilities of bleeding, infection, the need for additional procedures, failure to diagnose a condition, and creating a complication requiring transfusion or operation were discussed with the patient. The patient concurred with the proposed plan, giving informed consent.  The site of surgery properly noted/marked. The patient was taken to Operating Room # 2, identified, and the procedure verified as right Breast seed localized lumpectomy x 2 and left breast seed localized lumpectomy. A Time Out was held and the above information confirmed.  The bilateral breast and chest were prepped and draped in standard fashion. The right side was addressed first.  The seeds were both located.  The lumpectomy was performed by creating a superolateral circumareolar incision near the previously placed radioactive seed.  Dissection was carried down to around the point of maximum signal intensity. The cautery was used to perform the dissection.  Hemostasis was achieved with cautery. The edges of the cavity were marked with large clips, with one each medial, lateral, inferior and superior, and two clips posteriorly.   The specimen was inked with the margin marker paint kit.    Specimen radiography confirmed inclusion of the mammographic lesion, the clip, and the seed.  The background signal in the breast was zero.  The wound was irrigated and closed with 3-0 vicryl in layers and 4-0  monocryl subcuticular suture.    The left side was addressed second. The lumpectomy was performed via a lateral incision.  Lumpectomy marked with margin marker paint kit.  Specimen confirmed.  Wound irrigated and hemostasis obtained.  Clips placed at margins.      Sterile dressings were applied. At the end of the operation, all sponge, instrument, and needle counts were correct.  Findings: grossly clear surgical margins and no adenopathy  Estimated Blood Loss:  min         Specimens: right breast tissue with seed 10 o'clock, second right breast lumpectomy with seed 12 o'clock,  and left breast tissue with seed.              Complications:  None; patient tolerated the procedure well.         Disposition: PACU - hemodynamically stable.         Condition: stable

## 2018-05-02 NOTE — Transfer of Care (Signed)
Immediate Anesthesia Transfer of Care Note  Patient: [name]  Procedure(s) Performed: RIGHT BREAST SEED LUMPECTOMY X2 AND LEFT BREAST SEED GUIDED LUMPECTOMY (Bilateral Breast)  Patient Location: PACU  Anesthesia Type:General  Level of Consciousness: awake, alert  and oriented  Airway & Oxygen Therapy: Patient Spontanous Breathing and Patient connected to nasal cannula oxygen  Post-op Assessment: Report given to RN and Post -op Vital signs reviewed and stable  Post vital signs: Reviewed and stable  Last Vitals:  Vitals Value Taken Time  BP 115/81 05/02/2018  2:23 PM  Temp    Pulse 90 05/02/2018  2:25 PM  Resp 18 05/02/2018  2:25 PM  SpO2 93 % 05/02/2018  2:25 PM  Vitals shown include unvalidated device data.  Last Pain:  Vitals:   05/02/18 1014  TempSrc: Oral  PainSc: 0-No pain      Patients Stated Pain Goal: 2 (30/94/07 6808)  Complications: No apparent anesthesia complications

## 2018-05-02 NOTE — Discharge Instructions (Addendum)
[facility] °Office Phone Number [phone] ° °BREAST BIOPSY/ PARTIAL MASTECTOMY: POST OP INSTRUCTIONS ° °Always review your discharge instruction sheet given to you by the facility where your surgery was performed. ° °IF YOU HAVE DISABILITY OR FAMILY LEAVE FORMS, YOU MUST BRING THEM TO THE OFFICE FOR PROCESSING.  DO NOT GIVE THEM TO YOUR DOCTOR. ° °1. A prescription for pain medication may be given to you upon discharge.  Take your pain medication as prescribed, if needed.  If narcotic pain medicine is not needed, then you may take acetaminophen (Tylenol) or ibuprofen (Advil) as needed. °2. Take your usually prescribed medications unless otherwise directed °3. If you need a refill on your pain medication, please contact your pharmacy.  They will contact our office to request authorization.  Prescriptions will not be filled after 5pm or on week-ends. °4. You should eat very light the first 24 hours after surgery, such as soup, crackers, pudding, etc.  Resume your normal diet the day after surgery. °5. Most patients will experience some swelling and bruising in the breast.  Ice packs and a good support bra will help.  Swelling and bruising can take several days to resolve.  °6. It is common to experience some constipation if taking pain medication after surgery.  Increasing fluid intake and taking a stool softener will usually help or prevent this problem from occurring.  A mild laxative (Milk of Magnesia or Miralax) should be taken according to package directions if there are no bowel movements after 48 hours. °7. Unless discharge instructions indicate otherwise, you may remove your bandages 48 hours after surgery, and you may shower at that time.  You may have steri-strips (small skin tapes) in place directly over the incision.  These strips should be left on the skin for 7-10 days.   Any sutures or staples will be removed at the office during your follow-up visit. °8. ACTIVITIES:  You may resume  regular daily activities (gradually increasing) beginning the next day.  Wearing a good support bra or sports bra (or the breast binder) minimizes pain and swelling.  You may have sexual intercourse when it is comfortable. °a. You may drive when you no longer are taking prescription pain medication, you can comfortably wear a seatbelt, and you can safely maneuver your car and apply brakes. °b. RETURN TO WORK:  __________1 week_______________ °9. You should see your doctor in the office for a follow-up appointment approximately two weeks after your surgery.  Your doctor’s nurse will typically make your follow-up appointment when she calls you with your pathology report.  Expect your pathology report 2-3 business days after your surgery.  You may call to check if you do not hear from us after three days. ° ° °WHEN TO CALL YOUR DOCTOR: °1. Fever over 101.0 °2. Nausea and/or vomiting. °3. Extreme swelling or bruising. °4. Continued bleeding from incision. °5. Increased pain, redness, or drainage from the incision. ° °The clinic staff is available to answer your questions during regular business hours.  Please don’t hesitate to call and ask to speak to one of the nurses for clinical concerns.  If you have a medical emergency, go to the nearest emergency room or call 911.  A surgeon from [facility] is always on call at the hospital. ° °For further questions, please visit [email or web address]  ° ° °Post Anesthesia Home Care Instructions ° °Activity: °Get plenty of rest for the remainder of the day. A responsible individual must stay with you for 24   hours following the procedure.  °For the next 24 hours, DO NOT: °-Drive a car °-Operate machinery °-Drink alcoholic beverages °-Take any medication unless instructed by your physician °-Make any legal decisions or sign important papers. ° °Meals: °Start with liquid foods such as gelatin or soup. Progress to regular foods as tolerated. Avoid greasy, spicy,  heavy foods. If nausea and/or vomiting occur, drink only clear liquids until the nausea and/or vomiting subsides. Call your physician if vomiting continues. ° °Special Instructions/Symptoms: °Your throat may feel dry or sore from the anesthesia or the breathing tube placed in your throat during surgery. If this causes discomfort, gargle with warm salt water. The discomfort should disappear within 24 hours. ° °If you had a scopolamine patch placed behind your ear for the management of post- operative nausea and/or vomiting: ° °1. The medication in the patch is effective for 72 hours, after which it should be removed.  Wrap patch in a tissue and discard in the trash. Wash hands thoroughly with soap and water. °2. You may remove the patch earlier than 72 hours if you experience unpleasant side effects which may include dry mouth, dizziness or visual disturbances. °3. Avoid touching the patch. Wash your hands with soap and water after contact with the patch. °  ° °

## 2018-05-02 NOTE — Anesthesia Procedure Notes (Signed)
Procedure Name: LMA Insertion Date/Time: 05/02/2018 12:25 PM Performed by: Maryella Shivers, CRNA Pre-anesthesia Checklist: Patient identified, Emergency Drugs available, Suction available and Patient being monitored Patient Re-evaluated:Patient Re-evaluated prior to induction Oxygen Delivery Method: Circle system utilized Preoxygenation: Pre-oxygenation with 100% oxygen Induction Type: IV induction Ventilation: Mask ventilation without difficulty LMA: LMA inserted LMA Size: 4.0 Number of attempts: 1 Airway Equipment and Method: Bite block Placement Confirmation: positive ETCO2 Tube secured with: Tape Dental Injury: Teeth and Oropharynx as per pre-operative assessment

## 2018-05-03 ENCOUNTER — Encounter (HOSPITAL_BASED_OUTPATIENT_CLINIC_OR_DEPARTMENT_OTHER): Payer: Self-pay | Admitting: General Surgery

## 2018-05-04 ENCOUNTER — Other Ambulatory Visit: Payer: Self-pay | Admitting: Oncology

## 2018-05-08 ENCOUNTER — Telehealth: Payer: Self-pay | Admitting: General Surgery

## 2018-05-08 ENCOUNTER — Other Ambulatory Visit: Payer: Self-pay | Admitting: General Surgery

## 2018-05-08 NOTE — Telephone Encounter (Signed)
Discussed pathology with patient.  Will set up re-excision right breast.

## 2018-05-09 ENCOUNTER — Encounter: Payer: Self-pay | Admitting: Family Medicine

## 2018-05-09 ENCOUNTER — Ambulatory Visit (INDEPENDENT_AMBULATORY_CARE_PROVIDER_SITE_OTHER): Payer: Medicare Other | Admitting: Family Medicine

## 2018-05-09 VITALS — BP 122/62 | HR 62 | Temp 98.0°F | Resp 18 | Ht 63.0 in | Wt 191.4 lb

## 2018-05-09 DIAGNOSIS — E669 Obesity, unspecified: Secondary | ICD-10-CM | POA: Diagnosis not present

## 2018-05-09 DIAGNOSIS — D0511 Intraductal carcinoma in situ of right breast: Secondary | ICD-10-CM

## 2018-05-09 DIAGNOSIS — E1165 Type 2 diabetes mellitus with hyperglycemia: Secondary | ICD-10-CM | POA: Diagnosis not present

## 2018-05-09 DIAGNOSIS — G4733 Obstructive sleep apnea (adult) (pediatric): Secondary | ICD-10-CM | POA: Diagnosis not present

## 2018-05-09 DIAGNOSIS — F418 Other specified anxiety disorders: Secondary | ICD-10-CM

## 2018-05-09 DIAGNOSIS — Z0000 Encounter for general adult medical examination without abnormal findings: Secondary | ICD-10-CM

## 2018-05-09 DIAGNOSIS — Z23 Encounter for immunization: Secondary | ICD-10-CM | POA: Diagnosis not present

## 2018-05-09 DIAGNOSIS — I10 Essential (primary) hypertension: Secondary | ICD-10-CM | POA: Diagnosis not present

## 2018-05-09 DIAGNOSIS — E782 Mixed hyperlipidemia: Secondary | ICD-10-CM | POA: Diagnosis not present

## 2018-05-09 LAB — LIPID PANEL
CHOL/HDL RATIO: 6
Cholesterol: 227 mg/dL — ABNORMAL HIGH (ref 0–200)
HDL: 37.5 mg/dL — AB (ref >39.00)
LDL CALC: 152 mg/dL — AB (ref 0–99)
NonHDL: 189.69
TRIGLYCERIDES: 190 mg/dL — AB (ref 0.0–149.0)
VLDL: 38 mg/dL (ref 0.0–40.0)

## 2018-05-09 LAB — COMPREHENSIVE METABOLIC PANEL
ALBUMIN: 4.2 g/dL (ref 3.5–5.2)
ALK PHOS: 86 U/L (ref 39–117)
ALT: 57 U/L — ABNORMAL HIGH (ref 0–35)
AST: 51 U/L — AB (ref 0–37)
BUN: 14 mg/dL (ref 6–23)
CO2: 28 mEq/L (ref 19–32)
CREATININE: 0.81 mg/dL (ref 0.40–1.20)
Calcium: 9.7 mg/dL (ref 8.4–10.5)
Chloride: 99 mEq/L (ref 96–112)
GFR: 91.07 mL/min (ref >60.00)
Glucose, Bld: 96 mg/dL (ref 70–99)
POTASSIUM: 4.1 meq/L (ref 3.5–5.1)
SODIUM: 138 meq/L (ref 135–145)
TOTAL PROTEIN: 7.3 g/dL (ref 6.0–8.3)
Total Bilirubin: 0.5 mg/dL (ref 0.2–1.2)

## 2018-05-09 LAB — CBC
HCT: 40.2 % (ref 36.0–46.0)
Hemoglobin: 13.1 g/dL (ref 12.0–15.0)
MCHC: 32.6 g/dL (ref 30.0–36.0)
MCV: 79.8 fl (ref 78.0–100.0)
PLATELETS: 304 10*3/uL (ref 150.0–400.0)
RBC: 5.04 Mil/uL (ref 3.87–5.11)
RDW: 14.8 % (ref 11.5–15.5)
WBC: 7 10*3/uL (ref 4.0–10.5)

## 2018-05-09 LAB — HEMOGLOBIN A1C: HEMOGLOBIN A1C: 7.8 % — AB (ref 4.6–6.5)

## 2018-05-09 LAB — TSH: TSH: 0.97 u[IU]/mL (ref 0.35–4.50)

## 2018-05-09 NOTE — Assessment & Plan Note (Signed)
Stopped CPAP and got a dental appliance but she lost some teeth so she stopped using it. Ask her husband how she is doing.

## 2018-05-09 NOTE — Assessment & Plan Note (Signed)
Encouraged heart healthy diet, increase exercise, avoid trans fats, consider a krill oil cap daily 

## 2018-05-09 NOTE — Assessment & Plan Note (Signed)
Encouraged DASH diet, decrease po intake and increase exercise as tolerated. Needs 7-8 hours of sleep nightly. Avoid trans fats, eat small, frequent meals every 4-5 hours with lean proteins, complex carbs and healthy fats. Minimize simple carbs, GMO foods. 

## 2018-05-09 NOTE — Assessment & Plan Note (Signed)
hgba1c acceptable, minimize simple carbs. Increase exercise as tolerated. Continue current meds. Lowest 49, highest 384 this past month. Averaging around 140 to 170.

## 2018-05-09 NOTE — Patient Instructions (Addendum)
[employer] has a special insurance product for diabetics  Bring [place] a copy of your advanced directives  Shingrix is the new shingles shot. 2 shots over 2-6 months at pharmacy  Preventive Care 65 Years and Older, Female Preventive care refers to lifestyle choices and visits with your health care provider that can promote health and wellness. What does preventive care include?  A yearly physical exam. This is also called an annual well check.  Dental exams once or twice a year.  Routine eye exams. Ask your health care provider how often you should have your eyes checked.  Personal lifestyle choices, including: ? Daily care of your teeth and gums. ? Regular physical activity. ? Eating a healthy diet. ? Avoiding tobacco and drug use. ? Limiting alcohol use. ? Practicing safe sex. ? Taking low-dose aspirin every day. ? Taking vitamin and mineral supplements as recommended by your health care provider. What happens during an annual well check? The services and screenings done by your health care provider during your annual well check will depend on your age, overall health, lifestyle risk factors, and family history of disease. Counseling Your health care provider may ask you questions about your:  Alcohol use.  Tobacco use.  Drug use.  Emotional well-being.  Home and relationship well-being.  Sexual activity.  Eating habits.  History of falls.  Memory and ability to understand (cognition).  Work and work Statistician.  Reproductive health.  Screening You may have the following tests or measurements:  Height, weight, and BMI.  Blood pressure.  Lipid and cholesterol levels. These may be checked every 5 years, or more frequently if you are over 58 years old.  Skin check.  Lung cancer screening. You may have this screening every year starting at age 24 if you have a 30-pack-year history of smoking and currently smoke or have quit within the  past 15 years.  Fecal occult blood test (FOBT) of the stool. You may have this test every year starting at age 37.  Flexible sigmoidoscopy or colonoscopy. You may have a sigmoidoscopy every 5 years or a colonoscopy every 10 years starting at age 20.  Hepatitis C blood test.  Hepatitis B blood test.  Sexually transmitted disease (STD) testing.  Diabetes screening. This is done by checking your blood sugar (glucose) after you have not eaten for a while (fasting). You may have this done every 1-3 years.  Bone density scan. This is done to screen for osteoporosis. You may have this done starting at age 20.  Mammogram. This may be done every 1-2 years. Talk to your health care provider about how often you should have regular mammograms.  Talk with your health care provider about your test results, treatment options, and if necessary, the need for more tests. Vaccines Your health care provider may recommend certain vaccines, such as:  Influenza vaccine. This is recommended every year.  Tetanus, diphtheria, and acellular pertussis (Tdap, Td) vaccine. You may need a Td booster every 10 years.  Varicella vaccine. You may need this if you have not been vaccinated.  Zoster vaccine. You may need this after age 75.  Measles, mumps, and rubella (MMR) vaccine. You may need at least one dose of MMR if you were born in 1957 or later. You may also need a second dose.  Pneumococcal 13-valent conjugate (PCV13) vaccine. One dose is recommended after age 1.  Pneumococcal polysaccharide (PPSV23) vaccine. One dose is recommended after age 27.  Meningococcal vaccine. You may  need this if you have certain conditions.  Hepatitis A vaccine. You may need this if you have certain conditions or if you travel or work in places where you may be exposed to hepatitis A.  Hepatitis B vaccine. You may need this if you have certain conditions or if you travel or work in places where you may be exposed to  hepatitis B.  Haemophilus influenzae type b (Hib) vaccine. You may need this if you have certain conditions.  Talk to your health care provider about which screenings and vaccines you need and how often you need them. This information is not intended to replace advice given to you by your health care provider. Make sure you discuss any questions you have with your health care provider. Document Released: 06/27/2015 Document Revised: 02/18/2016 Document Reviewed: 04/01/2015 Elsevier Interactive Patient Education  [employer].

## 2018-05-09 NOTE — Assessment & Plan Note (Signed)
Managing despite her recent surgery

## 2018-05-09 NOTE — Assessment & Plan Note (Addendum)
Had surgery last week and is doing well

## 2018-05-09 NOTE — Assessment & Plan Note (Signed)
Well controlled, no changes to meds. Encouraged heart healthy diet such as the DASH diet and exercise as tolerated.  °

## 2018-05-09 NOTE — Assessment & Plan Note (Signed)
Patient encouraged to maintain heart healthy diet, regular exercise, adequate sleep. Consider daily probiotics. Take medications as prescribed 

## 2018-05-15 ENCOUNTER — Telehealth: Payer: Self-pay | Admitting: Radiation Oncology

## 2018-05-15 NOTE — Progress Notes (Signed)
Subjective:    Patient ID: [name], female    DOB: [birth year], 67 y.o.   MRN: [id number]  No chief complaint on file.   HPI Patient is in today for annual preventative exam and follow-up on chronic medical concerns including hyperlipidemia, DCIS, diabetes and hypertension.  She feels well today.  Underwent surgery on both breasts last week and is healing well.  Is scheduled for radiation starting on December 5 weekdays x6 weeks.  There were no metastasis found that she will not be subjected to chemotherapy.  She denies any systemic symptoms except for some mild fatigue which is baseline.  No recent febrile illness or other hospitalization.  She is doing well with activities of daily living.  No polyuria or polydipsia.  Tries to maintain a heart healthy diet and stay active. Denies CP/palp/SOB/HA/congestion/fevers/GI or GU c/o. Taking meds as prescribed  Past Medical History:  Diagnosis Date  . Acute bronchitis 10/31/2015  . Allergy    seasonal  . Arthritis    knee-left  . Cataract    right eye  . Diabetes mellitus 52   type 2  . Diaphragmatic hernia without mention of obstruction or gangrene   . Endometriosis   . Esophageal reflux   . Esophageal stricture   . GERD (gastroesophageal reflux disease)   . Hyperlipidemia   . Hyperplastic colon polyp   . Hypertension 52  . Nonalcoholic fatty liver disease 04/28/2010   Qualifier: Diagnosis of  By: Arnoldo Morale MD, Balinda Quails   . Nonspecific abnormal results of liver function study   . Pharyngitis 02/18/2012  . Plantar fascial fibromatosis   . Preventative health care 01/22/2013  . Seasonal allergies 10/31/2015  . Sleep apnea   . Sore throat 04/03/2017  . Tubular adenoma of colon   . Unspecified sleep apnea    no c-pap    Past Surgical History:  Procedure Laterality Date  . ABDOMINAL HYSTERECTOMY  1982   partial  . APPENDECTOMY    . 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 BREAST CYST INCISION AND DRAINAGE     right breast  .  BREAST LUMPECTOMY WITH RADIOACTIVE SEED LOCALIZATION Bilateral 05/02/2018   Procedure: RIGHT BREAST SEED LUMPECTOMY X2 AND LEFT BREAST SEED GUIDED LUMPECTOMY;  Surgeon: [name], MD;  Location: [facility];  Service: General;  Laterality: Bilateral;  . CATARACT EXTRACTION     right eye  . HERNIA REPAIR     per pt, she is not aware of this surgery  . seba    . TUBAL LIGATION      Family History  Problem Relation Age of Onset  . Breast cancer Mother   . Hypertension Mother   . Diabetes Mother        type 2  . Other Mother        esophagus surgery  . Heart disease Father   . Hyperlipidemia Father   . Hypertension Father   . Heart attack Father        MI at age 76  . Diabetes Brother        type 2  . Heart disease Maternal Grandmother   . Heart attack Maternal Grandfather   . Hypertension Maternal Grandfather   . Stroke Paternal Grandmother   . Hypertension Sister   . Diabetes Sister   . Hypertension Brother   . Alcohol abuse Maternal Aunt   . Cancer Maternal Aunt   . Colon cancer Neg Hx   .  Stomach cancer Neg Hx   . Esophageal cancer Neg Hx     Social History   Socioeconomic History  . Marital status: Married    Spouse name: [name]  . Number of children: 2  . Years of education: Not on file  . Highest education level: Not on file  Occupational History  . Occupation: Building surveyor: [employer]  . Financial resource strain: Not on file  . Food insecurity:    Worry: Not on file    Inability: Not on file  . Transportation needs:    Medical: Not on file    Non-medical: Not on file  Tobacco Use  . Smoking status: Never Smoker  . Smokeless tobacco: Never Used  Substance and Sexual Activity  . Alcohol use: No    Alcohol/week: 0.0 standard drinks  . Drug use: No  . Sexual activity: Yes    Comment: lives with husband, no dietary restrictions just watching carbs, wears seat belt  Lifestyle  . Physical activity:     Days per week: Not on file    Minutes per session: Not on file  . Stress: Not on file  Relationships  . Social connections:    Talks on phone: Not on file    Gets together: Not on file    Attends religious service: Not on file    Active member of club or organization: Not on file    Attends meetings of clubs or organizations: Not on file    Relationship status: Not on file  . Intimate partner violence:    Fear of current or ex partner: Not on file    Emotionally abused: Not on file    Physically abused: Not on file    Forced sexual activity: Not on file  Other Topics Concern  . Not on file  Social History Narrative  . Not on file    Outpatient Medications Prior to Visit  Medication Sig Dispense Refill  . ASPIR-LOW 81 MG EC tablet TAKE 1 TABLET BY MOUTH DAILY 30 tablet 3  . bisoprolol-hydrochlorothiazide (ZIAC) 5-6.25 MG tablet TAKE 1 TABLET BY MOUTH DAILY. 90 tablet 0  . canagliflozin (INVOKANA) 100 MG TABS tablet Take 100 mg by mouth daily before breakfast.    . glimepiride (AMARYL) 1 MG tablet Take 1 mg in am and 2 mg before a larger dinner. 270 tablet 3  . glucose blood (CONTOUR NEXT TEST) test strip USE TO CHECK BLOOD SUGAR DAILY AS NEEDED DX:E11.9 100 each 5  . meclizine (ANTIVERT) 25 MG tablet Take 1 tablet (25 mg total) by mouth 3 (three) times daily as needed for dizziness. 15 tablet 0  . metFORMIN (GLUCOPHAGE) 1000 MG tablet TAKE 1 TABLET BY MOUTH TWICE A DAY AND TAKE 1/2 TABLET AT NOON 75 tablet 1  . omeprazole (PRILOSEC) 40 MG capsule Take 1 capsule (40 mg total) by mouth 2 (two) times daily. 180 capsule 3  . ondansetron (ZOFRAN ODT) 8 MG disintegrating tablet Take 1 tablet (8 mg total) by mouth every 8 (eight) hours as needed for nausea or vomiting. 10 tablet 0  . oxyCODONE (OXY IR/ROXICODONE) 5 MG immediate release tablet Take 0.5-1 tablets (2.5-5 mg total) by mouth every 6 (six) hours as needed for severe pain. 20 tablet 0   Facility-Administered Medications Prior to  Visit  Medication Dose Route Frequency Provider Last Rate Last Dose  . Chlorhexidine Gluconate Cloth 2 % PADS 6 each  6 each Topical Once [place],  Dorris Fetch, MD       And  . Chlorhexidine Gluconate Cloth 2 % PADS 6 each  6 each Topical Once [name], MD        Allergies  Allergen Reactions  . Pravastatin Other (See Comments)    Myalgia, fatigue, memory loss  . Amoxicillin Itching    REACTION: rash  . Atorvastatin Other (See Comments)    Myalgia, fatigue, memory loss  . Codeine Nausea And Vomiting    Review of Systems  Constitutional: Positive for malaise/fatigue. Negative for chills and fever.  HENT: Negative for congestion and hearing loss.   Eyes: Negative for discharge.  Respiratory: Negative for cough, sputum production and shortness of breath.   Cardiovascular: Negative for chest pain, palpitations and leg swelling.  Gastrointestinal: Negative for abdominal pain, blood in stool, constipation, diarrhea, heartburn, nausea and vomiting.  Genitourinary: Negative for dysuria, frequency, hematuria and urgency.  Musculoskeletal: Negative for back pain, falls and myalgias.  Skin: Negative for rash.  Neurological: Negative for dizziness, sensory change, loss of consciousness, weakness and headaches.  Endo/Heme/Allergies: Negative for environmental allergies. Does not bruise/bleed easily.  Psychiatric/Behavioral: Negative for depression and suicidal ideas. The patient is not nervous/anxious and does not have insomnia.        Objective:    Physical Exam  Constitutional: She is oriented to person, place, and time. No distress.  HENT:  Head: Normocephalic and atraumatic.  Right Ear: External ear normal.  Left Ear: External ear normal.  Nose: Nose normal.  Mouth/Throat: Oropharynx is clear and moist. No oropharyngeal exudate.  Eyes: Pupils are equal, round, and reactive to light. Conjunctivae are normal. Right eye exhibits no discharge. Left eye exhibits no discharge. No scleral  icterus.  Neck: Normal range of motion. Neck supple. No thyromegaly present.  Cardiovascular: Normal rate, regular rhythm, normal heart sounds and intact distal pulses.  No murmur heard. Pulmonary/Chest: Effort normal and breath sounds normal. No respiratory distress. She has no wheezes. She has no rales.  Abdominal: Soft. Bowel sounds are normal. She exhibits no distension and no mass. There is no tenderness.  Musculoskeletal: Normal range of motion. She exhibits no edema or tenderness.  Lymphadenopathy:    She has no cervical adenopathy.  Neurological: She is alert and oriented to person, place, and time. She has normal reflexes. She displays normal reflexes. No cranial nerve deficit. Coordination normal.  Skin: Skin is warm and dry. No rash noted. She is not diaphoretic.    BP 122/62 (BP Location: Left Arm, Patient Position: Sitting, Cuff Size: Normal)   Pulse 62   Temp 98 F (36.7 C) (Oral)   Resp 18   Ht 5\' 3"  (1.6 m)   Wt 191 lb 6.4 oz (86.8 kg)   SpO2 97%   BMI 33.90 kg/m  Wt Readings from Last 3 Encounters:  05/09/18 191 lb 6.4 oz (86.8 kg)  05/02/18 192 lb 10.9 oz (87.4 kg)  03/15/18 192 lb 12.8 oz (87.5 kg)     Lab Results  Component Value Date   WBC 7.0 05/09/2018   HGB 13.1 05/09/2018   HCT 40.2 05/09/2018   PLT 304.0 05/09/2018   GLUCOSE 96 05/09/2018   CHOL 227 (H) 05/09/2018   TRIG 190.0 (H) 05/09/2018   HDL 37.50 (L) 05/09/2018   LDLCALC 152 (H) 05/09/2018   ALT 57 (H) 05/09/2018   AST 51 (H) 05/09/2018   NA 138 05/09/2018   K 4.1 05/09/2018   CL 99 05/09/2018   CREATININE 0.81 05/09/2018  BUN 14 05/09/2018   CO2 28 05/09/2018   TSH 0.97 05/09/2018   HGBA1C 7.8 (H) 05/09/2018   MICROALBUR <0.7 12/04/2015    Lab Results  Component Value Date   TSH 0.97 05/09/2018   Lab Results  Component Value Date   WBC 7.0 05/09/2018   HGB 13.1 05/09/2018   HCT 40.2 05/09/2018   MCV 79.8 05/09/2018   PLT 304.0 05/09/2018   Lab Results  Component  Value Date   NA 138 05/09/2018   K 4.1 05/09/2018   CO2 28 05/09/2018   GLUCOSE 96 05/09/2018   BUN 14 05/09/2018   CREATININE 0.81 05/09/2018   BILITOT 0.5 05/09/2018   ALKPHOS 86 05/09/2018   AST 51 (H) 05/09/2018   ALT 57 (H) 05/09/2018   PROT 7.3 05/09/2018   ALBUMIN 4.2 05/09/2018   CALCIUM 9.7 05/09/2018   ANIONGAP 11 04/26/2018   GFR 91.07 05/09/2018   Lab Results  Component Value Date   CHOL 227 (H) 05/09/2018   Lab Results  Component Value Date   HDL 37.50 (L) 05/09/2018   Lab Results  Component Value Date   LDLCALC 152 (H) 05/09/2018   Lab Results  Component Value Date   TRIG 190.0 (H) 05/09/2018   Lab Results  Component Value Date   CHOLHDL 6 05/09/2018   Lab Results  Component Value Date   HGBA1C 7.8 (H) 05/09/2018       Assessment & Plan:   Problem List Items Addressed This Visit    Type 2 diabetes mellitus with hyperglycemia, without long-term current use of insulin (HCC)    hgba1c acceptable, minimize simple carbs. Increase exercise as tolerated. Continue current meds. Lowest 49, highest 384 this past month. Averaging around 140 to 170.      Relevant Orders   Hemoglobin A1c (Completed)   Hyperlipidemia, mixed    Encouraged heart healthy diet, increase exercise, avoid trans fats, consider a krill oil cap daily      Relevant Orders   Lipid panel (Completed)   Depression with anxiety    Managing despite her recent surgery      Essential hypertension    Well controlled, no changes to meds. Encouraged heart healthy diet such as the DASH diet and exercise as tolerated.       Relevant Orders   CBC (Completed)   TSH (Completed)   Comprehensive metabolic panel (Completed)   OSA (obstructive sleep apnea)    Stopped CPAP and got a dental appliance but she lost some teeth so she stopped using it. Ask her husband how she is doing.       Preventative health care    Patient encouraged to maintain heart healthy diet, regular exercise, adequate  sleep. Consider daily probiotics. Take medications as prescribed      Obesity (BMI 30-39.9)    Encouraged DASH diet, decrease po intake and increase exercise as tolerated. Needs 7-8 hours of sleep nightly. Avoid trans fats, eat small, frequent meals every 4-5 hours with lean proteins, complex carbs and healthy fats. Minimize simple carbs, GMO foods.      Ductal carcinoma in situ (DCIS) of right breast    Had surgery last week and is doing well         I have discontinued Kailie Smartt's oxyCODONE. I am also having her maintain her ASPIR-LOW, glimepiride, meclizine, ondansetron, omeprazole, glucose blood, bisoprolol-hydrochlorothiazide, metFORMIN, and canagliflozin.  No orders of the defined types were placed in this encounter.    [name], MD

## 2018-05-15 NOTE — Telephone Encounter (Signed)
Opened in error

## 2018-05-17 ENCOUNTER — Encounter (HOSPITAL_BASED_OUTPATIENT_CLINIC_OR_DEPARTMENT_OTHER): Payer: Self-pay | Admitting: *Deleted

## 2018-05-17 ENCOUNTER — Other Ambulatory Visit: Payer: Self-pay

## 2018-05-18 ENCOUNTER — Ambulatory Visit: Payer: Medicare Other

## 2018-05-18 ENCOUNTER — Ambulatory Visit: Payer: Medicare Other | Admitting: Radiation Oncology

## 2018-05-19 NOTE — Progress Notes (Signed)
Ensure pre-surgical drink given to patient with instructions to have consumed by 1200 day of surgery.  Also instructed pt. On hibiclens wash night before and morning of surgery. Pt verbalized understanding.

## 2018-05-25 ENCOUNTER — Ambulatory Visit (HOSPITAL_BASED_OUTPATIENT_CLINIC_OR_DEPARTMENT_OTHER): Payer: Medicare Other | Admitting: Certified Registered"

## 2018-05-25 ENCOUNTER — Encounter (HOSPITAL_BASED_OUTPATIENT_CLINIC_OR_DEPARTMENT_OTHER)
Admission: RE | Disposition: A | Discharge status: Home / Self Care | Payer: Self-pay | Source: Home / Self Care | Attending: General Surgery

## 2018-05-25 ENCOUNTER — Ambulatory Visit (HOSPITAL_BASED_OUTPATIENT_CLINIC_OR_DEPARTMENT_OTHER)
Admission: RE | Admit: 2018-05-25 | Discharge: 2018-05-25 | Disposition: A | Discharge status: Home / Self Care | Payer: Medicare Other | Attending: General Surgery | Admitting: General Surgery

## 2018-05-25 ENCOUNTER — Encounter (HOSPITAL_BASED_OUTPATIENT_CLINIC_OR_DEPARTMENT_OTHER): Payer: Self-pay | Admitting: *Deleted

## 2018-05-25 DIAGNOSIS — Z171 Estrogen receptor negative status [ER-]: Secondary | ICD-10-CM | POA: Insufficient documentation

## 2018-05-25 DIAGNOSIS — Z888 Allergy status to other drugs, medicaments and biological substances status: Secondary | ICD-10-CM | POA: Diagnosis not present

## 2018-05-25 DIAGNOSIS — Z885 Allergy status to narcotic agent status: Secondary | ICD-10-CM | POA: Insufficient documentation

## 2018-05-25 DIAGNOSIS — G4730 Sleep apnea, unspecified: Secondary | ICD-10-CM | POA: Diagnosis not present

## 2018-05-25 DIAGNOSIS — C50412 Malignant neoplasm of upper-outer quadrant of left female breast: Secondary | ICD-10-CM | POA: Diagnosis not present

## 2018-05-25 DIAGNOSIS — E119 Type 2 diabetes mellitus without complications: Secondary | ICD-10-CM | POA: Insufficient documentation

## 2018-05-25 DIAGNOSIS — K219 Gastro-esophageal reflux disease without esophagitis: Secondary | ICD-10-CM | POA: Insufficient documentation

## 2018-05-25 DIAGNOSIS — I10 Essential (primary) hypertension: Secondary | ICD-10-CM | POA: Diagnosis not present

## 2018-05-25 DIAGNOSIS — C50911 Malignant neoplasm of unspecified site of right female breast: Secondary | ICD-10-CM | POA: Diagnosis not present

## 2018-05-25 DIAGNOSIS — D0511 Intraductal carcinoma in situ of right breast: Secondary | ICD-10-CM | POA: Insufficient documentation

## 2018-05-25 DIAGNOSIS — M1990 Unspecified osteoarthritis, unspecified site: Secondary | ICD-10-CM | POA: Insufficient documentation

## 2018-05-25 DIAGNOSIS — Z7982 Long term (current) use of aspirin: Secondary | ICD-10-CM | POA: Insufficient documentation

## 2018-05-25 DIAGNOSIS — Z79899 Other long term (current) drug therapy: Secondary | ICD-10-CM | POA: Diagnosis not present

## 2018-05-25 DIAGNOSIS — Z7984 Long term (current) use of oral hypoglycemic drugs: Secondary | ICD-10-CM | POA: Insufficient documentation

## 2018-05-25 DIAGNOSIS — Z880 Allergy status to penicillin: Secondary | ICD-10-CM | POA: Diagnosis not present

## 2018-05-25 DIAGNOSIS — F419 Anxiety disorder, unspecified: Secondary | ICD-10-CM | POA: Diagnosis not present

## 2018-05-25 DIAGNOSIS — D0501 Lobular carcinoma in situ of right breast: Secondary | ICD-10-CM | POA: Diagnosis not present

## 2018-05-25 DIAGNOSIS — N6489 Other specified disorders of breast: Secondary | ICD-10-CM | POA: Diagnosis not present

## 2018-05-25 HISTORY — DX: Malignant (primary) neoplasm, unspecified: C80.1

## 2018-05-25 HISTORY — PX: RE-EXCISION OF BREAST LUMPECTOMY: SHX6048

## 2018-05-25 LAB — GLUCOSE, CAPILLARY
Glucose-Capillary: 140 mg/dL — ABNORMAL HIGH (ref 70–99)
Glucose-Capillary: 116 mg/dL — ABNORMAL HIGH (ref 70–99)

## 2018-05-25 SURGERY — EXCISION, LESION, BREAST
Anesthesia: General | Site: Breast | Laterality: Right

## 2018-05-25 MED ORDER — CIPROFLOXACIN IN D5W 400 MG/200ML IV SOLN
400.0000 mg | INTRAVENOUS | Status: AC
  Administered 2018-05-25: 400 mg via INTRAVENOUS

## 2018-05-25 MED ORDER — FENTANYL CITRATE (PF) 100 MCG/2ML IJ SOLN
INTRAMUSCULAR | Status: AC
  Filled 2018-05-25: qty 2

## 2018-05-25 MED ORDER — CHLORHEXIDINE GLUCONATE CLOTH 2 % EX PADS: 6.0000 | MEDICATED_PAD | Freq: Once | CUTANEOUS | Status: DC

## 2018-05-25 MED ORDER — LIDOCAINE 2% (20 MG/ML) 5 ML SYRINGE
INTRAMUSCULAR | Status: DC | PRN
  Administered 2018-05-25: 60 mg via INTRAVENOUS

## 2018-05-25 MED ORDER — LIDOCAINE 2% (20 MG/ML) 5 ML SYRINGE
INTRAMUSCULAR | Status: AC
  Filled 2018-05-25: qty 5

## 2018-05-25 MED ORDER — PHENYLEPHRINE 40 MCG/ML (10ML) SYRINGE FOR IV PUSH (FOR BLOOD PRESSURE SUPPORT)
PREFILLED_SYRINGE | INTRAVENOUS | Status: AC
  Filled 2018-05-25: qty 10

## 2018-05-25 MED ORDER — MIDAZOLAM HCL 2 MG/2ML IJ SOLN: 1.0000 mg | INTRAMUSCULAR | Status: DC | PRN

## 2018-05-25 MED ORDER — FENTANYL CITRATE (PF) 100 MCG/2ML IJ SOLN
INTRAMUSCULAR | Status: DC | PRN
  Administered 2018-05-25: 25 ug via INTRAVENOUS

## 2018-05-25 MED ORDER — OXYCODONE HCL 5 MG PO TABS: 5.0000 mg | ORAL_TABLET | Freq: Once | ORAL | Status: DC | PRN

## 2018-05-25 MED ORDER — ACETAMINOPHEN 500 MG PO TABS
1000.0000 mg | ORAL_TABLET | ORAL | Status: AC
  Administered 2018-05-25: 1000 mg via ORAL

## 2018-05-25 MED ORDER — DEXAMETHASONE SODIUM PHOSPHATE 10 MG/ML IJ SOLN
INTRAMUSCULAR | Status: DC | PRN
  Administered 2018-05-25: 5 mg via INTRAVENOUS

## 2018-05-25 MED ORDER — LACTATED RINGERS IV SOLN
INTRAVENOUS | Status: DC
  Administered 2018-05-25 (×2): via INTRAVENOUS

## 2018-05-25 MED ORDER — MIDAZOLAM HCL 2 MG/2ML IJ SOLN
INTRAMUSCULAR | Status: AC
  Filled 2018-05-25: qty 2

## 2018-05-25 MED ORDER — OXYCODONE HCL 5 MG/5ML PO SOLN: 5.0000 mg | Freq: Once | ORAL | Status: DC | PRN

## 2018-05-25 MED ORDER — GABAPENTIN 300 MG PO CAPS
ORAL_CAPSULE | ORAL | Status: AC
  Filled 2018-05-25: qty 1

## 2018-05-25 MED ORDER — FENTANYL CITRATE (PF) 100 MCG/2ML IJ SOLN: 25.0000 ug | INTRAMUSCULAR | Status: DC | PRN

## 2018-05-25 MED ORDER — ONDANSETRON HCL 4 MG/2ML IJ SOLN
INTRAMUSCULAR | Status: AC
  Filled 2018-05-25: qty 12

## 2018-05-25 MED ORDER — GABAPENTIN 300 MG PO CAPS
300.0000 mg | ORAL_CAPSULE | ORAL | Status: AC
  Administered 2018-05-25: 300 mg via ORAL

## 2018-05-25 MED ORDER — PROPOFOL 10 MG/ML IV BOLUS
INTRAVENOUS | Status: DC | PRN
  Administered 2018-05-25: 200 mg via INTRAVENOUS

## 2018-05-25 MED ORDER — FENTANYL CITRATE (PF) 100 MCG/2ML IJ SOLN: 50.0000 ug | INTRAMUSCULAR | Status: DC | PRN

## 2018-05-25 MED ORDER — DEXAMETHASONE SODIUM PHOSPHATE 10 MG/ML IJ SOLN
INTRAMUSCULAR | Status: AC
  Filled 2018-05-25: qty 4

## 2018-05-25 MED ORDER — KETOROLAC TROMETHAMINE 30 MG/ML IJ SOLN
INTRAMUSCULAR | Status: AC
  Filled 2018-05-25: qty 1

## 2018-05-25 MED ORDER — ONDANSETRON HCL 4 MG/2ML IJ SOLN
INTRAMUSCULAR | Status: DC | PRN
  Administered 2018-05-25: 4 mg via INTRAVENOUS

## 2018-05-25 MED ORDER — SCOPOLAMINE 1 MG/3DAYS TD PT72: 1.0000 | MEDICATED_PATCH | Freq: Once | TRANSDERMAL | Status: DC | PRN

## 2018-05-25 MED ORDER — LIDOCAINE HCL 1 % IJ SOLN
INTRAMUSCULAR | Status: DC | PRN
  Administered 2018-05-25: 20 mL via SUBCUTANEOUS

## 2018-05-25 MED ORDER — PHENYLEPHRINE 40 MCG/ML (10ML) SYRINGE FOR IV PUSH (FOR BLOOD PRESSURE SUPPORT)
PREFILLED_SYRINGE | INTRAVENOUS | Status: AC
  Filled 2018-05-25: qty 20

## 2018-05-25 MED ORDER — MIDAZOLAM HCL 5 MG/5ML IJ SOLN
INTRAMUSCULAR | Status: DC | PRN
  Administered 2018-05-25: 2 mg via INTRAVENOUS

## 2018-05-25 MED ORDER — ACETAMINOPHEN 500 MG PO TABS
ORAL_TABLET | ORAL | Status: AC
  Filled 2018-05-25: qty 2

## 2018-05-25 MED ORDER — ONDANSETRON HCL 4 MG/2ML IJ SOLN: 4.0000 mg | Freq: Four times a day (QID) | INTRAMUSCULAR | Status: DC | PRN

## 2018-05-25 MED ORDER — CIPROFLOXACIN IN D5W 400 MG/200ML IV SOLN
INTRAVENOUS | Status: AC
  Filled 2018-05-25: qty 200

## 2018-05-25 SURGICAL SUPPLY — 56 items
ADH SKN CLS APL DERMABOND .7 (GAUZE/BANDAGES/DRESSINGS) ×1
BINDER BREAST 3XL (GAUZE/BANDAGES/DRESSINGS) IMPLANT
BINDER BREAST LRG (GAUZE/BANDAGES/DRESSINGS) IMPLANT
BINDER BREAST MEDIUM (GAUZE/BANDAGES/DRESSINGS) IMPLANT
BINDER BREAST XLRG (GAUZE/BANDAGES/DRESSINGS) IMPLANT
BINDER BREAST XXLRG (GAUZE/BANDAGES/DRESSINGS) ×1 IMPLANT
BLADE SURG 10 STRL SS (BLADE) ×2 IMPLANT
BLADE SURG 15 STRL LF DISP TIS (BLADE) IMPLANT
BLADE SURG 15 STRL SS (BLADE) ×2
CANISTER SUCT 1200ML W/VALVE (MISCELLANEOUS) ×2 IMPLANT
CHLORAPREP W/TINT 26ML (MISCELLANEOUS) ×2 IMPLANT
CLIP VESOCCLUDE LG 6/CT (CLIP) ×2 IMPLANT
COVER BACK TABLE 60X90IN (DRAPES) ×2 IMPLANT
COVER MAYO STAND STRL (DRAPES) ×2 IMPLANT
COVER WAND RF STERILE (DRAPES) IMPLANT
DECANTER SPIKE VIAL GLASS SM (MISCELLANEOUS) IMPLANT
DERMABOND ADVANCED (GAUZE/BANDAGES/DRESSINGS) ×1
DERMABOND ADVANCED .7 DNX12 (GAUZE/BANDAGES/DRESSINGS) ×1 IMPLANT
DRAPE LAPAROSCOPIC ABDOMINAL (DRAPES) ×2 IMPLANT
DRAPE UTILITY XL STRL (DRAPES) ×2 IMPLANT
ELECT COATED BLADE 2.86 ST (ELECTRODE) ×2 IMPLANT
ELECT REM PT RETURN 9FT ADLT (ELECTROSURGICAL) ×2
ELECTRODE REM PT RTRN 9FT ADLT (ELECTROSURGICAL) ×1 IMPLANT
GAUZE SPONGE 4X4 12PLY STRL (GAUZE/BANDAGES/DRESSINGS) IMPLANT
GAUZE SPONGE 4X4 12PLY STRL LF (GAUZE/BANDAGES/DRESSINGS) ×2 IMPLANT
GLOVE BIO SURGEON STRL SZ 6 (GLOVE) ×2 IMPLANT
GLOVE BIO SURGEON STRL SZ 6.5 (GLOVE) ×1 IMPLANT
GLOVE BIOGEL PI IND STRL 6.5 (GLOVE) ×1 IMPLANT
GLOVE BIOGEL PI IND STRL 7.0 (GLOVE) IMPLANT
GLOVE BIOGEL PI INDICATOR 6.5 (GLOVE) ×1
GLOVE BIOGEL PI INDICATOR 7.0 (GLOVE) ×1
GOWN STRL REUS W/ TWL LRG LVL3 (GOWN DISPOSABLE) ×1 IMPLANT
GOWN STRL REUS W/TWL 2XL LVL3 (GOWN DISPOSABLE) ×2 IMPLANT
GOWN STRL REUS W/TWL LRG LVL3 (GOWN DISPOSABLE) ×2
KIT MARKER MARGIN INK (KITS) ×2 IMPLANT
NDL HYPO 25X1 1.5 SAFETY (NEEDLE) ×1 IMPLANT
NEEDLE HYPO 25X1 1.5 SAFETY (NEEDLE) ×2 IMPLANT
NS IRRIG 1000ML POUR BTL (IV SOLUTION) ×2 IMPLANT
PACK BASIN DAY SURGERY FS (CUSTOM PROCEDURE TRAY) ×2 IMPLANT
PENCIL BUTTON HOLSTER BLD 10FT (ELECTRODE) ×2 IMPLANT
SLEEVE SCD COMPRESS KNEE MED (MISCELLANEOUS) ×2 IMPLANT
SPONGE LAP 18X18 RF (DISPOSABLE) ×2 IMPLANT
STAPLER VISISTAT 35W (STAPLE) IMPLANT
STRIP CLOSURE SKIN 1/2X4 (GAUZE/BANDAGES/DRESSINGS) ×2 IMPLANT
SUT MON AB 4-0 PC3 18 (SUTURE) ×2 IMPLANT
SUT SILK 2 0 SH (SUTURE) IMPLANT
SUT VIC AB 3-0 54X BRD REEL (SUTURE) IMPLANT
SUT VIC AB 3-0 BRD 54 (SUTURE)
SUT VIC AB 3-0 SH 27 (SUTURE) ×4
SUT VIC AB 3-0 SH 27X BRD (SUTURE) ×1 IMPLANT
SYR BULB 3OZ (MISCELLANEOUS) ×2 IMPLANT
SYR CONTROL 10ML LL (SYRINGE) ×2 IMPLANT
TOWEL GREEN STERILE FF (TOWEL DISPOSABLE) ×2 IMPLANT
TOWEL OR NON WOVEN STRL DISP B (DISPOSABLE) ×2 IMPLANT
TUBE CONNECTING 20X1/4 (TUBING) ×2 IMPLANT
YANKAUER SUCT BULB TIP NO VENT (SUCTIONS) ×2 IMPLANT

## 2018-05-25 NOTE — Discharge Instructions (Addendum)
°Post Anesthesia Home Care Instructions ° °Activity: °Get plenty of rest for the remainder of the day. A responsible individual must stay with you for 24 hours following the procedure.  °For the next 24 hours, DO NOT: °-Drive a car °-Operate machinery °-Drink alcoholic beverages °-Take any medication unless instructed by your physician °-Make any legal decisions or sign important papers. ° °Meals: °Start with liquid foods such as gelatin or soup. Progress to regular foods as tolerated. Avoid greasy, spicy, heavy foods. If nausea and/or vomiting occur, drink only clear liquids until the nausea and/or vomiting subsides. Call your physician if vomiting continues. ° °Special Instructions/Symptoms: °Your throat may feel dry or sore from the anesthesia or the breathing tube placed in your throat during surgery. If this causes discomfort, gargle with warm salt water. The discomfort should disappear within 24 hours. ° °If you had a scopolamine patch placed behind your ear for the management of post- operative nausea and/or vomiting: ° °1. The medication in the patch is effective for 72 hours, after which it should be removed.  Wrap patch in a tissue and discard in the trash. Wash hands thoroughly with soap and water. °2. You may remove the patch earlier than 72 hours if you experience unpleasant side effects which may include dry mouth, dizziness or visual disturbances. °3. Avoid touching the patch. Wash your hands with soap and water after contact with the patch. °   °[facility] °Office Phone Number [phone] ° °BREAST BIOPSY/ PARTIAL MASTECTOMY: POST OP INSTRUCTIONS ° °Always review your discharge instruction sheet given to you by the facility where your surgery was performed. ° °IF YOU HAVE DISABILITY OR FAMILY LEAVE FORMS, YOU MUST BRING THEM TO THE OFFICE FOR PROCESSING.  DO NOT GIVE THEM TO YOUR DOCTOR. ° °1. A prescription for pain medication may be given to you upon discharge.  Take your pain  medication as prescribed, if needed.  If narcotic pain medicine is not needed, then you may take acetaminophen (Tylenol) or ibuprofen (Advil) as needed. °2. Take your usually prescribed medications unless otherwise directed °3. If you need a refill on your pain medication, please contact your pharmacy.  They will contact our office to request authorization.  Prescriptions will not be filled after 5pm or on week-ends. °4. You should eat very light the first 24 hours after surgery, such as soup, crackers, pudding, etc.  Resume your normal diet the day after surgery. °5. Most patients will experience some swelling and bruising in the breast.  Ice packs and a good support bra will help.  Swelling and bruising can take several days to resolve.  °6. It is common to experience some constipation if taking pain medication after surgery.  Increasing fluid intake and taking a stool softener will usually help or prevent this problem from occurring.  A mild laxative (Milk of Magnesia or Miralax) should be taken according to package directions if there are no bowel movements after 48 hours. °7. Unless discharge instructions indicate otherwise, you may remove your bandages 48 hours after surgery, and you may shower at that time.  You may have steri-strips (small skin tapes) in place directly over the incision.  These strips should be left on the skin for 7-10 days.   Any sutures or staples will be removed at the office during your follow-up visit. °8. ACTIVITIES:  You may resume regular daily activities (gradually increasing) beginning the next day.  Wearing a good support bra or sports bra (or the breast binder) minimizes   pain and swelling.  You may have sexual intercourse when it is comfortable. °a. You may drive when you no longer are taking prescription pain medication, you can comfortably wear a seatbelt, and you can safely maneuver your car and apply brakes. °b. RETURN TO WORK:  __________1 week_______________ °9. You should  see your doctor in the office for a follow-up appointment approximately two weeks after your surgery.  Your doctor’s nurse will typically make your follow-up appointment when she calls you with your pathology report.  Expect your pathology report 2-3 business days after your surgery.  You may call to check if you do not hear from us after three days. ° ° °WHEN TO CALL YOUR DOCTOR: °1. Fever over 101.0 °2. Nausea and/or vomiting. °3. Extreme swelling or bruising. °4. Continued bleeding from incision. °5. Increased pain, redness, or drainage from the incision. ° °The clinic staff is available to answer your questions during regular business hours.  Please don’t hesitate to call and ask to speak to one of the nurses for clinical concerns.  If you have a medical emergency, go to the nearest emergency room or call 911.  A surgeon from [facility] is always on call at the hospital. ° °For further questions, please visit [email or web address]  ° °

## 2018-05-25 NOTE — Interval H&P Note (Signed)
History and Physical Interval Note:  05/25/2018 3:02 PM  [name]  has presented today for surgery, with the diagnosis of RIGHT BREAST CANCER  The various methods of treatment have been discussed with the patient and family. After consideration of risks, benefits and other options for treatment, the patient has consented to  Procedure(s): RE-EXCISION OF RIGHT BREAST LUMPECTOMY (Right) as a surgical intervention .  The patient's history has been reviewed, patient examined, no change in status, stable for surgery.  I have reviewed the patient's chart and labs.  Questions were answered to the patient's satisfaction.     [name]

## 2018-05-25 NOTE — Anesthesia Preprocedure Evaluation (Signed)
Anesthesia Evaluation  Patient identified by MRN, date of birth, ID band Patient awake    Reviewed: Allergy & Precautions, H&P , NPO status , Patient's Chart, lab work & pertinent test results  Airway Mallampati: II   Neck ROM: full    Dental   Pulmonary sleep apnea ,    breath sounds clear to auscultation       Cardiovascular hypertension,  Rhythm:regular Rate:Normal     Neuro/Psych  Headaches, PSYCHIATRIC DISORDERS Anxiety Depression  Neuromuscular disease    GI/Hepatic GERD  ,Esophageal stricture   Endo/Other  diabetes, Type 2  Renal/GU      Musculoskeletal  (+) Arthritis ,   Abdominal   Peds  Hematology   Anesthesia Other Findings   Reproductive/Obstetrics                            Anesthesia Physical Anesthesia Plan  ASA: III  Anesthesia Plan: General   Post-op Pain Management:    Induction:   PONV Risk Score and Plan: 3 and Ondansetron, Dexamethasone, Midazolam and Treatment may vary due to age or medical condition  Airway Management Planned: LMA  Additional Equipment:   Intra-op Plan:   Post-operative Plan:   Informed Consent: I have reviewed the patients History and Physical, chart, labs and discussed the procedure including the risks, benefits and alternatives for the proposed anesthesia with the patient or authorized representative who has indicated his/her understanding and acceptance.     Plan Discussed with: CRNA, Anesthesiologist and Surgeon  Anesthesia Plan Comments:        Anesthesia Quick Evaluation

## 2018-05-25 NOTE — Progress Notes (Signed)
Patient did not take bisoprolol-hydrochlorothiazide this morning. BP WNL. Dr. Marcie Bal notified. No new orders at this time.

## 2018-05-25 NOTE — Transfer of Care (Signed)
Immediate Anesthesia Transfer of Care Note  Patient: [name]  Procedure(s) Performed: RE-EXCISION OF RIGHT BREAST LUMPECTOMY (Right Breast)  Patient Location: PACU  Anesthesia Type:General  Level of Consciousness: drowsy and patient cooperative  Airway & Oxygen Therapy: Patient Spontanous Breathing and Patient connected to face mask oxygen  Post-op Assessment: Report given to RN and Post -op Vital signs reviewed and stable  Post vital signs: Reviewed and stable  Last Vitals:  Vitals Value Taken Time  BP 131/82 05/25/2018  4:08 PM  Temp    Pulse 81 05/25/2018  4:12 PM  Resp 23 05/25/2018  4:12 PM  SpO2 100 % 05/25/2018  4:12 PM  Vitals shown include unvalidated device data.  Last Pain:  Vitals:   05/25/18 1356  TempSrc: Oral  PainSc:       Patients Stated Pain Goal: 4 (51/70/01 [id number])  Complications: No apparent anesthesia complications

## 2018-05-25 NOTE — Anesthesia Procedure Notes (Signed)
Procedure Name: LMA Insertion Date/Time: 05/25/2018 3:19 PM Performed by: Gwyndolyn Saxon, CRNA Pre-anesthesia Checklist: Patient identified, Emergency Drugs available, Suction available and Patient being monitored Patient Re-evaluated:Patient Re-evaluated prior to induction Oxygen Delivery Method: Circle system utilized Preoxygenation: Pre-oxygenation with 100% oxygen Induction Type: IV induction Ventilation: Mask ventilation without difficulty LMA: LMA inserted LMA Size: 4.0 Number of attempts: 1 Placement Confirmation: positive ETCO2 and breath sounds checked- equal and bilateral Tube secured with: Tape Dental Injury: Teeth and Oropharynx as per pre-operative assessment

## 2018-05-25 NOTE — Anesthesia Postprocedure Evaluation (Signed)
Anesthesia Post Note  Patient: Lyndsee Casa  Procedure(s) Performed: RE-EXCISION OF RIGHT BREAST LUMPECTOMY (Right Breast)     Patient location during evaluation: PACU Anesthesia Type: General Level of consciousness: awake and alert Pain management: pain level controlled Vital Signs Assessment: post-procedure vital signs reviewed and stable Respiratory status: spontaneous breathing, nonlabored ventilation, respiratory function stable and patient connected to nasal cannula oxygen Cardiovascular status: blood pressure returned to baseline and stable Postop Assessment: no apparent nausea or vomiting Anesthetic complications: no    Last Vitals:  Vitals:   05/25/18 1645 05/25/18 1700  BP: 127/81 122/71  Pulse: 76 77  Resp: 18 18  Temp:  36.6 C  SpO2: 100% 97%    Last Pain:  Vitals:   05/25/18 1700  TempSrc:   PainSc: 0-No pain                 Abisola Carrero S

## 2018-05-25 NOTE — Op Note (Signed)
Re-excisional Right Breast Lumpectomy   Indications: This patient presents with history of focally positive posterior margin after partial mastectomy for right breast cancer   Pre-operative Diagnosis: right breast cancer   Post-operative Diagnosis: right breast cancer   Surgeon: [name]   Assistants: n/a   Anesthesia: General anesthesia and Local anesthesia   ASA Class: 3   Procedure Details  The patient was seen in the Holding Room. The risks, benefits, complications, treatment options, and expected outcomes were discussed with the patient. The possibilities of reaction to medication, pulmonary aspiration, bleeding, infection, the need for additional procedures, failure to diagnose a condition, and creating a complication requiring transfusion or operation were discussed with the patient. The patient concurred with the proposed plan, giving informed consent. The site of surgery properly noted/marked. The patient was taken to Operating Room # 8, identified, and the procedure verified as re-excision of right breast cancer.  After induction of anesthesia, the right breast and chest were prepped and draped in standard fashion.  The lumpectomy was performed by reopening the prior incision. Seroma was aspirated. The mastopexy sutures were removed. Additional margins were taken at the posterior borders of the partial mastectomy cavity. Dissection was carried down to the pectoral fascia. Orientation sutures were placed in the specimens. Hemostasis was achieved with cautery. The wound was irrigated and closed with a 3-0 Vicryl deep dermal interrupted and a 4-0 Monocryl subcuticular closure in layers.  Sterile dressings were applied. At the end of the operation, all sponge, instrument, and needle counts were correct.   Findings:  grossly clear surgical margins, posterior margin is pectoralis.    Estimated Blood Loss: Minimal   Drains: none   Specimens: additional posterior margins.    Complications: None; patient tolerated the procedure well.   Disposition: PACU - hemodynamically stable.   Condition: stable

## 2018-05-26 ENCOUNTER — Encounter (HOSPITAL_BASED_OUTPATIENT_CLINIC_OR_DEPARTMENT_OTHER): Payer: Self-pay | Admitting: General Surgery

## 2018-05-26 ENCOUNTER — Ambulatory Visit: Payer: Medicare Other | Admitting: Internal Medicine

## 2018-05-26 NOTE — Progress Notes (Signed)
Please let patient know margins are now negative.

## 2018-05-27 DIAGNOSIS — K760 Fatty (change of) liver, not elsewhere classified: Secondary | ICD-10-CM | POA: Diagnosis not present

## 2018-05-27 DIAGNOSIS — D0511 Intraductal carcinoma in situ of right breast: Secondary | ICD-10-CM | POA: Diagnosis not present

## 2018-05-27 DIAGNOSIS — C50911 Malignant neoplasm of unspecified site of right female breast: Secondary | ICD-10-CM | POA: Diagnosis not present

## 2018-05-27 DIAGNOSIS — F418 Other specified anxiety disorders: Secondary | ICD-10-CM | POA: Diagnosis not present

## 2018-05-27 DIAGNOSIS — Z483 Aftercare following surgery for neoplasm: Secondary | ICD-10-CM | POA: Diagnosis not present

## 2018-05-27 DIAGNOSIS — E1165 Type 2 diabetes mellitus with hyperglycemia: Secondary | ICD-10-CM | POA: Diagnosis not present

## 2018-05-30 DIAGNOSIS — Z483 Aftercare following surgery for neoplasm: Secondary | ICD-10-CM | POA: Diagnosis not present

## 2018-05-30 DIAGNOSIS — K760 Fatty (change of) liver, not elsewhere classified: Secondary | ICD-10-CM | POA: Diagnosis not present

## 2018-05-30 DIAGNOSIS — C50911 Malignant neoplasm of unspecified site of right female breast: Secondary | ICD-10-CM | POA: Diagnosis not present

## 2018-05-30 DIAGNOSIS — D0511 Intraductal carcinoma in situ of right breast: Secondary | ICD-10-CM | POA: Diagnosis not present

## 2018-05-30 DIAGNOSIS — E1165 Type 2 diabetes mellitus with hyperglycemia: Secondary | ICD-10-CM | POA: Diagnosis not present

## 2018-05-30 DIAGNOSIS — F418 Other specified anxiety disorders: Secondary | ICD-10-CM | POA: Diagnosis not present

## 2018-06-01 MED FILL — metFORMIN HCL 1000 MG TABS: 1000 | 30 days supply | Qty: 75 | Fill #1

## 2018-06-01 MED FILL — INVOKANA 100 MG TABLET: 100 | 30 days supply | Qty: 30 | Fill #3

## 2018-06-02 DIAGNOSIS — C50911 Malignant neoplasm of unspecified site of right female breast: Secondary | ICD-10-CM | POA: Diagnosis not present

## 2018-06-02 DIAGNOSIS — E1165 Type 2 diabetes mellitus with hyperglycemia: Secondary | ICD-10-CM | POA: Diagnosis not present

## 2018-06-02 DIAGNOSIS — F418 Other specified anxiety disorders: Secondary | ICD-10-CM | POA: Diagnosis not present

## 2018-06-02 DIAGNOSIS — D0511 Intraductal carcinoma in situ of right breast: Secondary | ICD-10-CM | POA: Diagnosis not present

## 2018-06-02 DIAGNOSIS — K760 Fatty (change of) liver, not elsewhere classified: Secondary | ICD-10-CM | POA: Diagnosis not present

## 2018-06-02 DIAGNOSIS — Z483 Aftercare following surgery for neoplasm: Secondary | ICD-10-CM | POA: Diagnosis not present

## 2018-06-05 DIAGNOSIS — C50911 Malignant neoplasm of unspecified site of right female breast: Secondary | ICD-10-CM | POA: Diagnosis not present

## 2018-06-05 DIAGNOSIS — F418 Other specified anxiety disorders: Secondary | ICD-10-CM | POA: Diagnosis not present

## 2018-06-05 DIAGNOSIS — D0511 Intraductal carcinoma in situ of right breast: Secondary | ICD-10-CM | POA: Diagnosis not present

## 2018-06-05 DIAGNOSIS — K760 Fatty (change of) liver, not elsewhere classified: Secondary | ICD-10-CM | POA: Diagnosis not present

## 2018-06-05 DIAGNOSIS — E1165 Type 2 diabetes mellitus with hyperglycemia: Secondary | ICD-10-CM | POA: Diagnosis not present

## 2018-06-05 DIAGNOSIS — Z483 Aftercare following surgery for neoplasm: Secondary | ICD-10-CM | POA: Diagnosis not present

## 2018-06-13 DIAGNOSIS — F418 Other specified anxiety disorders: Secondary | ICD-10-CM | POA: Diagnosis not present

## 2018-06-13 DIAGNOSIS — Z483 Aftercare following surgery for neoplasm: Secondary | ICD-10-CM | POA: Diagnosis not present

## 2018-06-13 DIAGNOSIS — E1165 Type 2 diabetes mellitus with hyperglycemia: Secondary | ICD-10-CM | POA: Diagnosis not present

## 2018-06-13 DIAGNOSIS — C50911 Malignant neoplasm of unspecified site of right female breast: Secondary | ICD-10-CM | POA: Diagnosis not present

## 2018-06-13 DIAGNOSIS — K760 Fatty (change of) liver, not elsewhere classified: Secondary | ICD-10-CM | POA: Diagnosis not present

## 2018-06-13 DIAGNOSIS — D0511 Intraductal carcinoma in situ of right breast: Secondary | ICD-10-CM | POA: Diagnosis not present

## 2018-06-16 DIAGNOSIS — F418 Other specified anxiety disorders: Secondary | ICD-10-CM | POA: Diagnosis not present

## 2018-06-16 DIAGNOSIS — K760 Fatty (change of) liver, not elsewhere classified: Secondary | ICD-10-CM | POA: Diagnosis not present

## 2018-06-16 DIAGNOSIS — Z483 Aftercare following surgery for neoplasm: Secondary | ICD-10-CM | POA: Diagnosis not present

## 2018-06-16 DIAGNOSIS — D0511 Intraductal carcinoma in situ of right breast: Secondary | ICD-10-CM | POA: Diagnosis not present

## 2018-06-16 DIAGNOSIS — C50911 Malignant neoplasm of unspecified site of right female breast: Secondary | ICD-10-CM | POA: Diagnosis not present

## 2018-06-16 DIAGNOSIS — E1165 Type 2 diabetes mellitus with hyperglycemia: Secondary | ICD-10-CM | POA: Diagnosis not present

## 2018-06-16 MED FILL — GLIMEPIRIDE 1 MG TABLET: 1 | 90 days supply | Qty: 270 | Fill #2

## 2018-06-21 ENCOUNTER — Other Ambulatory Visit: Payer: Self-pay | Admitting: Oncology

## 2018-06-22 ENCOUNTER — Telehealth: Payer: Self-pay | Admitting: Radiation Oncology

## 2018-06-22 ENCOUNTER — Ambulatory Visit
Admission: RE | Admit: 2018-06-22 | Discharge: 2018-06-22 | Disposition: A | Discharge status: Home / Self Care | Payer: Medicare Other | Source: Ambulatory Visit | Attending: Radiation Oncology | Admitting: Radiation Oncology

## 2018-06-22 ENCOUNTER — Encounter: Payer: Self-pay | Admitting: Radiation Oncology

## 2018-06-22 ENCOUNTER — Other Ambulatory Visit: Payer: Self-pay

## 2018-06-22 VITALS — BP 131/77 | HR 90 | Temp 98.2°F | Resp 18 | Wt 190.0 lb

## 2018-06-22 DIAGNOSIS — D0512 Intraductal carcinoma in situ of left breast: Secondary | ICD-10-CM | POA: Diagnosis not present

## 2018-06-22 DIAGNOSIS — Z79899 Other long term (current) drug therapy: Secondary | ICD-10-CM | POA: Diagnosis not present

## 2018-06-22 DIAGNOSIS — M129 Arthropathy, unspecified: Secondary | ICD-10-CM | POA: Insufficient documentation

## 2018-06-22 DIAGNOSIS — Z170 Estrogen receptor positive status [ER+]: Secondary | ICD-10-CM | POA: Diagnosis not present

## 2018-06-22 DIAGNOSIS — K219 Gastro-esophageal reflux disease without esophagitis: Secondary | ICD-10-CM | POA: Insufficient documentation

## 2018-06-22 DIAGNOSIS — Z510 Encounter for antineoplastic radiation therapy: Secondary | ICD-10-CM | POA: Insufficient documentation

## 2018-06-22 DIAGNOSIS — Z171 Estrogen receptor negative status [ER-]: Secondary | ICD-10-CM | POA: Diagnosis not present

## 2018-06-22 DIAGNOSIS — E119 Type 2 diabetes mellitus without complications: Secondary | ICD-10-CM | POA: Diagnosis not present

## 2018-06-22 DIAGNOSIS — E785 Hyperlipidemia, unspecified: Secondary | ICD-10-CM | POA: Insufficient documentation

## 2018-06-22 DIAGNOSIS — D0511 Intraductal carcinoma in situ of right breast: Secondary | ICD-10-CM

## 2018-06-22 DIAGNOSIS — Z7984 Long term (current) use of oral hypoglycemic drugs: Secondary | ICD-10-CM | POA: Diagnosis not present

## 2018-06-22 DIAGNOSIS — Z86010 Personal history of colonic polyps: Secondary | ICD-10-CM | POA: Insufficient documentation

## 2018-06-22 DIAGNOSIS — Z803 Family history of malignant neoplasm of breast: Secondary | ICD-10-CM | POA: Insufficient documentation

## 2018-06-22 DIAGNOSIS — I10 Essential (primary) hypertension: Secondary | ICD-10-CM | POA: Insufficient documentation

## 2018-06-22 DIAGNOSIS — Z800 Family history of malignant neoplasm of digestive organs: Secondary | ICD-10-CM | POA: Diagnosis not present

## 2018-06-22 DIAGNOSIS — Z9889 Other specified postprocedural states: Secondary | ICD-10-CM | POA: Diagnosis not present

## 2018-06-22 DIAGNOSIS — Z853 Personal history of malignant neoplasm of breast: Secondary | ICD-10-CM | POA: Insufficient documentation

## 2018-06-22 NOTE — Addendum Note (Signed)
Encounter addended by: Hayden Pedro, PA-C on: 06/22/2018 5:03 PM  Actions taken: LOS modified, Clinical Note Signed

## 2018-06-22 NOTE — Progress Notes (Signed)
  Radiation Oncology         (336) [phone] ________________________________  Name: [name] MRN: [id number]  Date: 06/22/2018  DOB: 07/23/1950  Optical Surface Tracking Plan:  Since intensity modulated radiotherapy (IMRT) and 3D conformal radiation treatment methods are predicated on accurate and precise positioning for treatment, intrafraction motion monitoring is medically necessary to ensure accurate and safe treatment delivery.  The ability to quantify intrafraction motion without excessive ionizing radiation dose can only be performed with optical surface tracking. Accordingly, surface imaging offers the opportunity to obtain 3D measurements of patient position throughout IMRT and 3D treatments without excessive radiation exposure.  I am ordering optical surface tracking for this patient's upcoming course of radiotherapy. ________________________________  Kyung Rudd, MD 06/22/2018 4:01 PM    Reference:   Ursula Alert, J, et al. Surface imaging-based analysis of intrafraction motion for breast radiotherapy patients.Journal of [place], n. 6, nov. 2014. ISSN [id number].   Available at: <http://www.jacmp.org/index.php/jacmp/article/view/4957>.

## 2018-06-22 NOTE — Progress Notes (Signed)
  Radiation Oncology         (336) [phone] ________________________________  Name: [name] MRN: [id number]  Date: 06/22/2018  DOB: January 07, 1951   DIAGNOSIS:     ICD-10-CM   1. Ductal carcinoma in situ (DCIS) of right breast D05.11     SIMULATION AND TREATMENT PLANNING NOTE  The patient presented for simulation prior to beginning her course of radiation treatment for her diagnosis of right-sided breast cancer. The patient was placed in a supine position on a breast board. A customized vac-lock bag was constructed and this complex treatment device will be used on a daily basis during her treatment. In this fashion, a CT scan was obtained through the chest area and an isocenter was placed near the chest wall within the breast.  The patient will be planned to receive a course of radiation initially to a dose of 42.56 Gy. This will consist of a whole breast radiotherapy technique. To accomplish this, 2 customized blocks have been designed which will correspond to medial and lateral whole breast tangent fields. This treatment will be accomplished at 2.66 Gy per fraction. A forward planning technique will also be evaluated to determine if this approach improves the plan. It is anticipated that the patient will then receive a 8 Gy boost to the seroma cavity which has been contoured. This will be accomplished at 2 Gy per fraction.   This initial treatment will consist of a 3-D conformal technique. The seroma has been contoured as the primary target structure. Additionally, dose volume histograms of both this target as well as the lungs and heart will also be evaluated. Such an approach is necessary to ensure that the target area is adequately covered while the nearby critical  normal structures are adequately spared.  Plan:  The final anticipated total dose therefore will correspond to 50.56 Gy.    _______________________________   Jodelle Gross, MD, PhD

## 2018-06-22 NOTE — Telephone Encounter (Signed)
I called the patient to let her know when we went back to document her HPI Dr. Lisbeth Renshaw and I had not been aware of the DCIS diagnosis from her left breast back in October 2019, only the lumpectomy findings discussed in conference. I reviewed with her the rationale to include the left breast in the treatment and we will need to re-simulate. She is in agreement. I will also reach out to Dr. Jana Hakim to ask if she will need antiestrogen therapy as her left DCIS was ER/PR positive. She is in agreement with this.      Carola Rhine, PAC

## 2018-06-22 NOTE — Progress Notes (Addendum)
Radiation Oncology         [phone] ________________________________  Name: [name]        MRN: [id number]  Date of Service: 06/22/2018 DOB: 15-Jan-1951  GL:OVFIE, Bonnita Levan, MD  Magrinat, Virgie Dad, MD     REFERRING PHYSICIAN: Magrinat, Virgie Dad, MD   DIAGNOSIS: The encounter diagnosis was Ductal carcinoma in situ (DCIS) of right breast.   HISTORY OF PRESENT ILLNESS: [name] is a 68 y.o. female who was originally seen in the multidisciplinary breast clinic for a new diagnosis of right breast cancer. The patient was noted to have screening detected calcifications in the right breast.  Diagnostic imaging revealed clusters of calcifications in bilateral breasts that were previously stable but a new 1.7 cm cluster was noted in the central breast.  Ultrasound of her axilla was negative for adenopathy.  A biopsy on 03/07/2018 revealed high-grade DCIS with focus of suspicion for early invasion, ER/PR negative.  She went on to have an MRI of her breasts on 03/17/2018, revealing a 2.6 x 1.3 x 0.6 cm area of masslike enhancement in the central to posterior aspect of the right breast at 12 to 1 o'clock position with biopsy clip marker at the site of her recently biopsied DCIS.  She also had a 1.1 x 0.8 x 0.7 cm mass in the more medial and anterior aspect of the upper inner quadrant 2:00, and 2 rounded enhancing masses with mildly irregular margins at the 10 o'clock position measuring 8 x 8 mm anteriorly and 5 x 5 mm posteriorly, there was also a 5 x 4 mm enhancing mass in the anterior right breast at 12:00.  There was also a 5 x 5 mm area in the 1130 o'clock position of the right breast.  Multiple smaller similar-appearing nodular areas were also noted throughout the right breast.  In the left breast, there was a rounded enhancing mass with mildly irregular margins at 1230 o'clock measuring 5 x 5 mm.  No abnormal adenopathy was identified.  She went on to have a biopsy of the left breast at 12:00  revealing low grade, ER/PR positive, DCIS, low-grade, at 10:00 the right breast biopsy revealed a complex sclerosing lesion with atypical lobular hyperplasia.  Additional biopsies on 04/03/2018 of the right upper inner quadrant revealed LCIS, upper outer quadrant biopsy revealed LCIS as well.  At the time of her surgery on 05/02/2018 her right breast lumpectomy revealed lobular neoplasia at 10:00, at 12:00 DCIS with calcifications, intermediate grade spanning 9 mm and this was focally present at the posterior margin of specimen 2.  In the right breast at 12:00 an additional medial margin did not reveal any malignancy.  In the left breast she had an intraductal papilloma and healing biopsy site without evidence of malignancy.  She underwent re-excision of her right breast posterior margin on 05/25/2018 which revealed benign breast parenchyma with previous procedure related changes negative for in situ or invasive cancer.   PREVIOUS RADIATION THERAPY: No   PAST MEDICAL HISTORY:  Past Medical History:  Diagnosis Date  . Acute bronchitis 10/31/2015  . Allergy    seasonal  . Arthritis    knee-left  . Cancer ([place]) 04/2018   right breast ca  . Cataract    right eye  . Diabetes mellitus 52   type 2  . Diaphragmatic hernia without mention of obstruction or gangrene   . Endometriosis   . Esophageal reflux   . Esophageal stricture   . GERD (gastroesophageal reflux  disease)   . Hyperlipidemia   . Hyperplastic colon polyp   . Hypertension 52  . Nonalcoholic fatty liver disease 04/28/2010   Qualifier: Diagnosis of  By: Arnoldo Morale MD, Balinda Quails   . Nonspecific abnormal results of liver function study   . Pharyngitis 02/18/2012  . Plantar fascial fibromatosis   . Preventative health care 01/22/2013  . Seasonal allergies 10/31/2015  . Sleep apnea    has oral appliance but does not fit well due to missing teeth  . Sore throat 04/03/2017  . Tubular adenoma of colon   . Unspecified sleep apnea    no c-pap         PAST SURGICAL HISTORY: Past Surgical History:  Procedure Laterality Date  . ABDOMINAL HYSTERECTOMY  1982   partial  . APPENDECTOMY    . 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 BREAST CYST INCISION AND DRAINAGE     right breast  . BREAST LUMPECTOMY WITH RADIOACTIVE SEED LOCALIZATION Bilateral 05/02/2018   Procedure: RIGHT BREAST SEED LUMPECTOMY X2 AND LEFT BREAST SEED GUIDED LUMPECTOMY;  Surgeon: [name], MD;  Location: [facility];  Service: General;  Laterality: Bilateral;  . CATARACT EXTRACTION     right eye  . HERNIA REPAIR     per pt, she is not aware of this surgery  . RE-EXCISION OF BREAST LUMPECTOMY Right 05/25/2018   Procedure: RE-EXCISION OF RIGHT BREAST LUMPECTOMY;  Surgeon: [name], MD;  Location: [facility];  Service: General;  Laterality: Right;  . seba    . TUBAL LIGATION       FAMILY HISTORY:  Family History  Problem Relation Age of Onset  . Breast cancer Mother   . Hypertension Mother   . Diabetes Mother        type 2  . Other Mother        esophagus surgery  . Heart disease Father   . Hyperlipidemia Father   . Hypertension Father   . Heart attack Father        MI at age 58  . Diabetes Brother        type 2  . Heart disease Maternal Grandmother   . Heart attack Maternal Grandfather   . Hypertension Maternal Grandfather   . Stroke Paternal Grandmother   . Hypertension Sister   . Diabetes Sister   . Hypertension Brother   . Alcohol abuse Maternal Aunt   . Cancer Maternal Aunt   . Colon cancer Neg Hx   . Stomach cancer Neg Hx   . Esophageal cancer Neg Hx      SOCIAL HISTORY:  reports that she has never smoked. She has never used smokeless tobacco. She reports that she does not drink alcohol or use drugs. The patient is married and lives in [place]. She is retired from working at [employer]. 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   ALLERGIES:  Pravastatin; Amoxicillin; Atorvastatin; and Codeine   MEDICATIONS:  Current Outpatient Medications  Medication Sig Dispense Refill  . ASPIR-LOW 81 MG EC tablet TAKE 1 TABLET BY MOUTH DAILY 30 tablet 3  . bisoprolol-hydrochlorothiazide (ZIAC) 5-6.25 MG tablet TAKE 1 TABLET BY MOUTH DAILY. 90 tablet 0  . canagliflozin (INVOKANA) 100 MG TABS tablet Take 100 mg by mouth daily before breakfast.    . glimepiride (AMARYL) 1 MG tablet Take 1 mg in am and 2 mg before a larger dinner. 270 tablet 3  .  glucose blood (CONTOUR NEXT TEST) test strip USE TO CHECK BLOOD SUGAR DAILY AS NEEDED DX:E11.9 100 each 5  . meclizine (ANTIVERT) 25 MG tablet Take 1 tablet (25 mg total) by mouth 3 (three) times daily as needed for dizziness. 15 tablet 0  . metFORMIN (GLUCOPHAGE) 1000 MG tablet TAKE 1 TABLET BY MOUTH TWICE A DAY AND TAKE 1/2 TABLET AT NOON 75 tablet 1  . omeprazole (PRILOSEC) 40 MG capsule Take 1 capsule (40 mg total) by mouth 2 (two) times daily. 180 capsule 3   No current facility-administered medications for this encounter.    Facility-Administered Medications Ordered in Other Encounters  Medication Dose Route Frequency Provider Last Rate Last Dose  . Chlorhexidine Gluconate Cloth 2 % PADS 6 each  6 each Topical Once [name], MD       And  . Chlorhexidine Gluconate Cloth 2 % PADS 6 each  6 each Topical Once [name], MD         REVIEW OF SYSTEMS: On review of systems, the patient reports that she is doing well overall. She denies any chest pain, shortness of breath, cough, fevers, chills, night sweats, unintended weight changes. She denies any bowel or bladder disturbances, and denies abdominal pain, nausea or vomiting. She denies any new musculoskeletal or joint aches or pains. A complete review of systems is obtained and is otherwise negative.     PHYSICAL EXAM:  Wt Readings from Last 3 Encounters:  06/22/18 190 lb (86.2 kg)  05/25/18 186 lb 11.7 oz (84.7 kg)  05/09/18 191 lb 6.4  oz (86.8 kg)   Temp Readings from Last 3 Encounters:  06/22/18 98.2 F (36.8 C) (Oral)  05/25/18 97.9 F (36.6 C)  05/09/18 98 F (36.7 C) (Oral)   BP Readings from Last 3 Encounters:  06/22/18 131/77  05/25/18 122/71  05/09/18 122/62   Pulse Readings from Last 3 Encounters:  06/22/18 90  05/25/18 77  05/09/18 62     In general this is a well appearing African American female in no acute distress. She is alert and oriented x4 and appropriate throughout the examination. HEENT reveals that the patient is normocephalic, atraumatic. EOMs are intact. Cardiopulmonary assessment is negative for acute distress and she exhibits normal effort. Bilateral breast exam reveals well-healed lumpectomy sites no evidence of cellulitic appearing changes noted.  No fluctuance is noted but she does have some mild induration at the right surgical site consistent with prior surgery.   ECOG = 0  0 - Asymptomatic (Fully active, able to carry on all predisease activities without restriction)  1 - Symptomatic but completely ambulatory (Restricted in physically strenuous activity but ambulatory and able to carry out work of a light or sedentary nature. For example, light housework, office work)  2 - Symptomatic, <50% in bed during the day (Ambulatory and capable of all self care but unable to carry out any work activities. Up and about more than 50% of waking hours)  3 - Symptomatic, >50% in bed, but not bedbound (Capable of only limited self-care, confined to bed or chair 50% or more of waking hours)  4 - Bedbound (Completely disabled. Cannot carry on any self-care. Totally confined to bed or chair)  5 - Death   Eustace Pen MM, Creech RH, Tormey DC, et al. [phone]). "Toxicity and response criteria of the [facility]". [place] Oncol. 5 (6): 649-55    LABORATORY DATA:  Lab Results  Component Value Date   WBC 7.0 05/09/2018  HGB 13.1 05/09/2018   HCT 40.2 05/09/2018   MCV 79.8  05/09/2018   PLT 304.0 05/09/2018   Lab Results  Component Value Date   NA 138 05/09/2018   K 4.1 05/09/2018   CL 99 05/09/2018   CO2 28 05/09/2018   Lab Results  Component Value Date   ALT 57 (H) 05/09/2018   AST 51 (H) 05/09/2018   ALKPHOS 86 05/09/2018   BILITOT 0.5 05/09/2018      RADIOGRAPHY: No results found.     IMPRESSION/PLAN: 1. Intermediate grade ER/PR negative DCIS of the right breast.. Dr. Lisbeth Renshaw discusses the pathology findings and reviews the nature of noninvasive breast disease. The patient is now healed and would benefit from adjuvant radiotherapy. We discussed the risks, benefits, short, and long term effects of radiotherapy, and the patient is interested in proceeding. Dr. Lisbeth Renshaw discusses the delivery and logistics of radiotherapy and anticipates a course of 4 weeks of radiotherapy. Written consent is obtained and placed in the chart, a copy was provided to the patient. She will proceed with simulation following today's visit. 2. Low grade ER/PR positive DCIS of the left breast. The patient's note was prepared after her visit and  I called the patient to let her know when we went back to document her HPI Dr. Lisbeth Renshaw and I had not been aware of the DCIS diagnosis from her left breast back in October 2019, only the lumpectomy findings discussed in conference. I reviewed with her the rationale to include the left breast in the treatment and we will need to re-simulate. She is in agreement. I will also reach out to Dr. Jana Hakim to ask if she will need antiestrogen therapy as her left DCIS was ER/PR positive. She is in agreement with this.   In a visit lasting 25 minutes, greater than 50% of the time was spent face to face discussing her case, and coordinating the patient's care.  The above documentation reflects my direct findings during this shared patient visit. Please see the separate note by Dr. Lisbeth Renshaw on this date for the remainder of the patient's plan of  care.    Carola Rhine, PAC

## 2018-06-23 ENCOUNTER — Telehealth: Payer: Self-pay | Admitting: Oncology

## 2018-06-23 ENCOUNTER — Other Ambulatory Visit: Payer: Self-pay | Admitting: Oncology

## 2018-06-23 NOTE — Telephone Encounter (Signed)
Scheduled appt per 1/10 sch message - unable to reach pt - left message and sent reminder letter in the mail .

## 2018-06-23 NOTE — Progress Notes (Unsigned)
[place]  [phone]) [phone] [phone]     ID: [name] DOB: Nov 02, 1950  MR#: [id number]  YFV#:[id number]  Patient Care Team: Mosie Lukes, MD as PCP - General (Family Medicine) Jola Schmidt, MD as Consulting Physician (Ophthalmology) Royston Sinner, Colin Benton, MD as Consulting Physician (Obstetrics and Gynecology) [name], MD as Consulting Physician (General Surgery) Sidonia Nutter, Virgie Dad, MD as Consulting Physician (Oncology) Philemon Kingdom, MD as Consulting Physician (Internal Medicine) Dene Gentry, MD as Consulting Physician (Sports Medicine) OTHER MD:  CHIEF COMPLAINT: Estrogen receptor negative ductal carcinoma in situ  CURRENT TREATMENT: Awaiting definitive surgery   HISTORY OF CURRENT ILLNESS: [name] had routine screening mammography on 02/20/2018 at [facility] showing a possible abnormality in the right breast. She underwent unilateral right diagnostic mammography with tomography and right breast ultrasonography at [facility] on 03/10/2018 showing: breast density category C. There is a cluster of heterogeneous calcifications in the right breast upper central quadrant. There was no sonographic abnormalities or lymphadenopathy in the right axilla.  Accordingly on 03/07/2018 she proceeded to biopsy of the right breast area in question. The pathology from this procedure showed ([id number]): Ductal carcinoma in situ, high grade. There are foci suspicious for early stromal invasion. Prognostic indicators significant for: both estrogen receptor and progesterone receptor, 0% negative.  The patient's subsequent history is as detailed below.  INTERVAL HISTORY: [name] was evaluated in the multidisciplinary breast cancer clinic on 03/15/2018 accompanied by her daughter, [name]. The patient's case was also presented at the multidisciplinary breast cancer conference on the same day. At that time a preliminary plan was proposed: Breast  conserving surgery with sentinel lymph node sampling followed by adjuvant radiation.   REVIEW OF SYSTEMS: There were no specific symptoms leading to the original mammogram, which was routinely scheduled. The patient denies unusual headaches, visual changes, nausea, vomiting, stiff neck, dizziness, or gait imbalance. There has been no cough, phlegm production, or pleurisy, no chest pain or pressure, and no change in bowel or bladder habits. The patient denies fever, rash, bleeding, unexplained fatigue or unexplained weight loss. A detailed review of systems was otherwise entirely negative.   PAST MEDICAL HISTORY: Past Medical History:  Diagnosis Date  . Acute bronchitis 10/31/2015  . Allergy    seasonal  . Arthritis    knee-left  . Cancer ([place]) 04/2018   right breast ca  . Cataract    right eye  . Diabetes mellitus 52   type 2  . Diaphragmatic hernia without mention of obstruction or gangrene   . Endometriosis   . Esophageal reflux   . Esophageal stricture   . GERD (gastroesophageal reflux disease)   . Hyperlipidemia   . Hyperplastic colon polyp   . Hypertension 52  . Nonalcoholic fatty liver disease 04/28/2010   Qualifier: Diagnosis of  By: Arnoldo Morale MD, Balinda Quails   . Nonspecific abnormal results of liver function study   . Pharyngitis 02/18/2012  . Plantar fascial fibromatosis   . Preventative health care 01/22/2013  . Seasonal allergies 10/31/2015  . Sleep apnea    has oral appliance but does not fit well due to missing teeth  . Sore throat 04/03/2017  . Tubular adenoma of colon   . Unspecified sleep apnea    no c-pap    PAST SURGICAL HISTORY: Past Surgical History:  Procedure Laterality Date  . ABDOMINAL HYSTERECTOMY  1982   partial  . APPENDECTOMY    . 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 BREAST  CYST INCISION AND DRAINAGE     right breast  . BREAST LUMPECTOMY WITH RADIOACTIVE SEED LOCALIZATION Bilateral 05/02/2018   Procedure: RIGHT BREAST SEED LUMPECTOMY X2 AND LEFT  BREAST SEED GUIDED LUMPECTOMY;  Surgeon: [name], MD;  Location: [place];  Service: General;  Laterality: Bilateral;  . CATARACT EXTRACTION     right eye  . HERNIA REPAIR     per pt, she is not aware of this surgery  . RE-EXCISION OF BREAST LUMPECTOMY Right 05/25/2018   Procedure: RE-EXCISION OF RIGHT BREAST LUMPECTOMY;  Surgeon: [name], MD;  Location: [place];  Service: General;  Laterality: Right;  . seba    . TUBAL LIGATION    Partial hysterectomy  FAMILY HISTORY Family History  Problem Relation Age of Onset  . Breast cancer Mother   . Hypertension Mother   . Diabetes Mother        type 2  . Other Mother        esophagus surgery  . Heart disease Father   . Hyperlipidemia Father   . Hypertension Father   . Heart attack Father        MI at age 32  . Diabetes Brother        type 2  . Heart disease Maternal Grandmother   . Heart attack Maternal Grandfather   . Hypertension Maternal Grandfather   . Stroke Paternal Grandmother   . Hypertension Sister   . Diabetes Sister   . Hypertension Brother   . Alcohol abuse Maternal Aunt   . Cancer Maternal Aunt   . Colon cancer Neg Hx   . Stomach cancer Neg Hx   . Esophageal cancer Neg Hx   The patient's father died at age 65 due to CHF. The patient's mother died at age 105 due to breast cancer diagnosed at age 88. The patient had 3 brothers and 3 sisters. There was a paternal aunt with breast cancer diagnosed in the 47's. There was also a maternal aunt with ovarian cancer that passed away. The patient notes that she had prior genetics testing in 2018 for ovarian and breast under Dr. Royston Sinner with normal results.   GYNECOLOGIC HISTORY:  No LMP recorded. Patient has had a hysterectomy. Menarche: 68 years old Age at first live birth: 68 years old She is [place] P2.  She is status post partial hysterectomy without BSO. Her LMP: was in 31 ( late 30's). She took hormone replacement for 3 years. She  also took oral contraception for 3 years with no complications.    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 Her husband, Christine Reading "Dexter" works in transportation and also worked as a Dealer. The patient's daughter, [name] lives in [place] and is a Hotel manager. The patient's daughter, [name] lives in [place] and works as a Engineer, mining. The patient has 2 grandchildren and 1 great grandchild plus one on the way.      ADVANCED DIRECTIVES:    HEALTH MAINTENANCE: Social History   Tobacco Use  . Smoking status: Never Smoker  . Smokeless tobacco: Never Used  Substance Use Topics  . Alcohol use: No    Alcohol/week: 0.0 standard drinks  . Drug use: No     Colonoscopy: 11/18/2015/ Dr. Fuller Plan  PAP: 03/2017  Bone density: 2018   Allergies  Allergen Reactions  . Pravastatin Other (See Comments)    Myalgia, fatigue, memory loss  . Amoxicillin Itching    REACTION: rash  .  Atorvastatin Other (See Comments)    Myalgia, fatigue, memory loss  . Codeine Nausea And Vomiting    Current Outpatient Medications  Medication Sig Dispense Refill  . ASPIR-LOW 81 MG EC tablet TAKE 1 TABLET BY MOUTH DAILY 30 tablet 3  . bisoprolol-hydrochlorothiazide (ZIAC) 5-6.25 MG tablet TAKE 1 TABLET BY MOUTH DAILY. 90 tablet 0  . canagliflozin (INVOKANA) 100 MG TABS tablet Take 100 mg by mouth daily before breakfast.    . glimepiride (AMARYL) 1 MG tablet Take 1 mg in am and 2 mg before a larger dinner. 270 tablet 3  . glucose blood (CONTOUR NEXT TEST) test strip USE TO CHECK BLOOD SUGAR DAILY AS NEEDED DX:E11.9 100 each 5  . meclizine (ANTIVERT) 25 MG tablet Take 1 tablet (25 mg total) by mouth 3 (three) times daily as needed for dizziness. 15 tablet 0  . metFORMIN (GLUCOPHAGE) 1000 MG tablet TAKE 1 TABLET BY MOUTH TWICE A DAY AND TAKE 1/2 TABLET AT NOON 75 tablet 1  . omeprazole (PRILOSEC) 40 MG capsule Take 1 capsule (40 mg total) by mouth 2 (two) times daily. 180 capsule 3   No  current facility-administered medications for this visit.    Facility-Administered Medications Ordered in Other Visits  Medication Dose Route Frequency Provider Last Rate Last Dose  . Chlorhexidine Gluconate Cloth 2 % PADS 6 each  6 each Topical Once [name], MD       And  . Chlorhexidine Gluconate Cloth 2 % PADS 6 each  6 each Topical Once [name], MD        OBJECTIVE: Middle-aged white Benson in no acute distress  There were no vitals filed for this visit.   There is no height or weight on file to calculate BMI.   Wt Readings from Last 3 Encounters:  06/22/18 190 lb (86.2 kg)  05/25/18 186 lb 11.7 oz (84.7 kg)  05/09/18 191 lb 6.4 oz (86.8 kg)      ECOG FS:0 - Asymptomatic  Ocular: Sclerae unicteric, pupils round and equal Lymphatic: No cervical or supraclavicular adenopathy Lungs no rales or rhonchi Heart regular rate and rhythm Abd soft, nontender, positive bowel sounds MSK no focal spinal tenderness, no joint edema Neuro: non-focal, well-oriented, appropriate affect Breasts: The right breast is status post recent biopsy.  There are no skin or nipple changes of concern.  The left breast is benign.  Both axillae are benign.   LAB RESULTS:  CMP     Component Value Date/Time   NA 138 05/09/2018 1407   K 4.1 05/09/2018 1407   CL 99 05/09/2018 1407   CO2 28 05/09/2018 1407   GLUCOSE 96 05/09/2018 1407   GLUCOSE 141 (H) 03/28/2006 0843   BUN 14 05/09/2018 1407   CREATININE 0.81 05/09/2018 1407   CREATININE 1.12 (H) 03/15/2018 1206   CREATININE 0.85 09/27/2013 1224   CALCIUM 9.7 05/09/2018 1407   PROT 7.3 05/09/2018 1407   ALBUMIN 4.2 05/09/2018 1407   AST 51 (H) 05/09/2018 1407   AST 44 (H) 03/15/2018 1206   ALT 57 (H) 05/09/2018 1407   ALT 70 (H) 03/15/2018 1206   ALKPHOS 86 05/09/2018 1407   BILITOT 0.5 05/09/2018 1407   BILITOT 0.5 03/15/2018 1206   GFRNONAA >60 04/26/2018 0930   GFRNONAA 50 (L) 03/15/2018 1206   GFRAA >60 04/26/2018 0930   GFRAA  58 (L) 03/15/2018 1206    No results found for: TOTALPROTELP, ALBUMINELP, A1GS, A2GS, BETS, BETA2SER, GAMS, MSPIKE, SPEI  No results found  for: KPAFRELGTCHN, LAMBDASER, [facility]  Lab Results  Component Value Date   WBC 7.0 05/09/2018   NEUTROABS 3.6 03/15/2018   HGB 13.1 05/09/2018   HCT 40.2 05/09/2018   MCV 79.8 05/09/2018   PLT 304.0 05/09/2018    @LASTCHEMISTRY @  No results found for: LABCA2  No components found for: [id number]  No results for input(s): INR in the last 168 hours.  No results found for: LABCA2  No results found for: WPY099  No results found for: IPJ825  No results found for: KNL976  No results found for: CA2729  No components found for: HGQUANT  No results found for: CEA1 / No results found for: CEA1   No results found for: AFPTUMOR  No results found for: CHROMOGRNA  No results found for: PSA1  No visits with results within 3 Day(s) from this visit.  Latest known visit with results is:  Admission on 05/25/2018, Discharged on 05/25/2018  Component Date Value Ref Range Status  . Glucose-Capillary 05/25/2018 140* 70 - 99 mg/dL Final  . Glucose-Capillary 05/25/2018 116* 70 - 99 mg/dL Final    (this displays the last labs from the last 3 days)  No results found for: TOTALPROTELP, ALBUMINELP, A1GS, A2GS, BETS, BETA2SER, GAMS, MSPIKE, SPEI (this displays SPEP labs)  No results found for: KPAFRELGTCHN, LAMBDASER, KAPLAMBRATIO (kappa/lambda light chains)  No results found for: HGBA, HGBA2QUANT, HGBFQUANT, HGBSQUAN (Hemoglobinopathy evaluation)   No results found for: LDH  No results found for: IRON, TIBC, IRONPCTSAT (Iron and TIBC)  No results found for: FERRITIN  Urinalysis    Component Value Date/Time   COLORURINE YELLOW 07/18/2017 0908   APPEARANCEUR CLEAR 07/18/2017 0908   LABSPEC 1.020 07/18/2017 0908   PHURINE 5.5 07/18/2017 0908   GLUCOSEU >=500 (A) 07/18/2017 0908   HGBUR NEGATIVE 07/18/2017 0908   HGBUR negative  08/22/2009 0814   BILIRUBINUR NEGATIVE 07/18/2017 0908   BILIRUBINUR negative 07/27/2016 1535   KETONESUR NEGATIVE 07/18/2017 0908   PROTEINUR NEGATIVE 07/18/2017 0908   UROBILINOGEN 4.0 07/27/2016 1535   UROBILINOGEN 0.2 05/28/2013 1121   NITRITE NEGATIVE 07/18/2017 0908   LEUKOCYTESUR NEGATIVE 07/18/2017 0908     STUDIES: No results found.  ELIGIBLE FOR AVAILABLE RESEARCH PROTOCOL: no  ASSESSMENT: 68 y.o. [name], [name] status post right breast biopsy 03/07/2018 for ductal carcinoma in situ, grade 3, estrogen and progesterone receptor negative  (1) definitive surgery pending  (2) adjuvant radiation to follow  (3) the patient has had genetics testing through her gynecologist.  Per patient no deleterious mutations found  PLAN: I spent approximately 60 minutes face to face with [name] with more than 50% of that time spent in counseling and coordination of care. Specifically we reviewed the biology of the patient's diagnosis and the specifics of her situation.  [name] understands that in noninvasive ductal carcinoma, also called ductal carcinoma in situ ("DCIS") the breast cancer cells remain trapped in the ducts were they started. They cannot travel to a vital organ. For that reason these cancers in themselves are not life-threatening.  If the whole breast is removed then all the ducts are removed and since the cancer cells are trapped in the ducts, the cure rate with mastectomy for noninvasive breast cancer is approximately 99%. Nevertheless we recommend lumpectomy, because there is no survival advantage to mastectomy and because the cosmetic result is generally superior with breast conservation.  Since the patient is keeping her breasts, there will be some risk of recurrence. The recurrence can only be in the same breast  since, again, the cells are trapped in the ducts. There is no connection from one breast to the other. The risk of local recurrence is cut by more than half  with radiation, which is standard in this situation.  In estrogen receptor positive cancers anti-estrogens can also be considered.  In this case however, the cancer being estrogen receptor negative, there is no indication for antiestrogen treatment.  In patients who carry a deleterious mutation [for example in a  BRCA gene], the risk of a new breast cancer developing in the future may be sufficiently great that the patient may choose bilateral mastectomies. However if she wishes to keep her breasts in that situation it is safe to do so. That would require intensified screening, which generally means not only yearly mammography but a yearly breast MRI as well.  [name] tells me she has been tested by her gynecologist and does not carry a definable mutation.  Accordingly more extensive surgery is not being contemplated  Accordingly the overall plan is for surgery, followed by radiation.  [place] has a good understanding of the overall plan. She agrees with it. She knows the goal of treatment in her case is cure. She knows we will be available for any problems that may develop regarding breast cancer in the future.  [name], Virgie Dad, MD  06/23/18 6:21 PM Medical Oncology and Hematology [facility] [address] [place], [place] [address] Tel. [phone]    Fax. [phone]  Alice Rieger, am acting as scribe for Chauncey Cruel MD.  I, Lurline Del MD, have reviewed the above documentation for accuracy and completeness, and I agree with the above.

## 2018-06-23 NOTE — Addendum Note (Signed)
Encounter addended by: Kyung Rudd, MD on: 06/23/2018 8:02 AM  Actions taken: Edit attestation on clinical note

## 2018-06-27 ENCOUNTER — Encounter: Payer: Self-pay | Admitting: Radiation Oncology

## 2018-06-27 ENCOUNTER — Ambulatory Visit
Admission: RE | Admit: 2018-06-27 | Discharge: 2018-06-27 | Disposition: A | Discharge status: Home / Self Care | Payer: Medicare Other | Source: Ambulatory Visit | Attending: Radiation Oncology | Admitting: Radiation Oncology

## 2018-06-27 DIAGNOSIS — D0512 Intraductal carcinoma in situ of left breast: Secondary | ICD-10-CM | POA: Diagnosis not present

## 2018-06-27 DIAGNOSIS — Z853 Personal history of malignant neoplasm of breast: Secondary | ICD-10-CM | POA: Diagnosis not present

## 2018-06-27 DIAGNOSIS — D0511 Intraductal carcinoma in situ of right breast: Secondary | ICD-10-CM

## 2018-06-27 DIAGNOSIS — Z510 Encounter for antineoplastic radiation therapy: Secondary | ICD-10-CM | POA: Diagnosis not present

## 2018-06-27 NOTE — Progress Notes (Addendum)
The patient came in for her replanning appointment and we reviewed the differences in her right breast cancer compared to her left. She was given copies of her pathology reports and will proceed with treatment on Thursday.

## 2018-06-28 DIAGNOSIS — Z853 Personal history of malignant neoplasm of breast: Secondary | ICD-10-CM | POA: Diagnosis not present

## 2018-06-28 DIAGNOSIS — Z510 Encounter for antineoplastic radiation therapy: Secondary | ICD-10-CM | POA: Diagnosis not present

## 2018-06-28 DIAGNOSIS — D0512 Intraductal carcinoma in situ of left breast: Secondary | ICD-10-CM | POA: Diagnosis not present

## 2018-06-28 DIAGNOSIS — D0511 Intraductal carcinoma in situ of right breast: Secondary | ICD-10-CM | POA: Diagnosis not present

## 2018-06-29 ENCOUNTER — Ambulatory Visit
Admission: RE | Admit: 2018-06-29 | Discharge: 2018-06-29 | Disposition: A | Discharge status: Home / Self Care | Payer: Medicare Other | Source: Ambulatory Visit | Attending: Radiation Oncology | Admitting: Radiation Oncology

## 2018-06-29 DIAGNOSIS — D0512 Intraductal carcinoma in situ of left breast: Secondary | ICD-10-CM | POA: Diagnosis not present

## 2018-06-29 DIAGNOSIS — Z510 Encounter for antineoplastic radiation therapy: Secondary | ICD-10-CM | POA: Diagnosis not present

## 2018-06-29 DIAGNOSIS — D0511 Intraductal carcinoma in situ of right breast: Secondary | ICD-10-CM | POA: Diagnosis not present

## 2018-06-29 DIAGNOSIS — Z853 Personal history of malignant neoplasm of breast: Secondary | ICD-10-CM | POA: Diagnosis not present

## 2018-06-30 ENCOUNTER — Ambulatory Visit
Admission: RE | Admit: 2018-06-30 | Discharge: 2018-06-30 | Disposition: A | Discharge status: Home / Self Care | Payer: Medicare Other | Source: Ambulatory Visit | Attending: Radiation Oncology | Admitting: Radiation Oncology

## 2018-06-30 DIAGNOSIS — D0512 Intraductal carcinoma in situ of left breast: Secondary | ICD-10-CM | POA: Diagnosis not present

## 2018-06-30 DIAGNOSIS — Z853 Personal history of malignant neoplasm of breast: Secondary | ICD-10-CM | POA: Diagnosis not present

## 2018-06-30 DIAGNOSIS — Z510 Encounter for antineoplastic radiation therapy: Secondary | ICD-10-CM | POA: Diagnosis not present

## 2018-06-30 DIAGNOSIS — D0511 Intraductal carcinoma in situ of right breast: Secondary | ICD-10-CM

## 2018-06-30 MED ORDER — RADIAPLEXRX EX GEL
Freq: Once | CUTANEOUS | Status: AC
  Administered 2018-06-30: 17:00:00 via TOPICAL

## 2018-06-30 MED ORDER — ALRA NON-METALLIC DEODORANT (RAD-ONC)
1.0000 "application " | Freq: Once | TOPICAL | Status: AC
  Administered 2018-06-30: 1 via TOPICAL

## 2018-06-30 NOTE — Progress Notes (Signed)
Pt here for patient teaching.  Pt given Radiation and You booklet, skin care instructions, Alra deodorant and Radiaplex gel.  Reviewed areas of pertinence such as fatigue, hair loss, skin changes, breast tenderness and breast swelling . Pt able to give teach back of to pat skin and use unscented/gentle soap,apply Radiaplex bid, avoid applying anything to skin within 4 hours of treatment, avoid wearing an under wire bra and to use an electric razor if they must shave. Pt verbalizes understanding of information given and will contact nursing with any questions or concerns.     Laelle Bridgett M. Larissa Pegg RN, BSN      

## 2018-07-03 ENCOUNTER — Ambulatory Visit
Admission: RE | Admit: 2018-07-03 | Discharge: 2018-07-03 | Disposition: A | Discharge status: Home / Self Care | Payer: Medicare Other | Source: Ambulatory Visit | Attending: Radiation Oncology | Admitting: Radiation Oncology

## 2018-07-03 DIAGNOSIS — Z853 Personal history of malignant neoplasm of breast: Secondary | ICD-10-CM | POA: Diagnosis not present

## 2018-07-03 DIAGNOSIS — D0512 Intraductal carcinoma in situ of left breast: Secondary | ICD-10-CM | POA: Diagnosis not present

## 2018-07-03 DIAGNOSIS — Z510 Encounter for antineoplastic radiation therapy: Secondary | ICD-10-CM | POA: Diagnosis not present

## 2018-07-03 DIAGNOSIS — D0511 Intraductal carcinoma in situ of right breast: Secondary | ICD-10-CM | POA: Diagnosis not present

## 2018-07-03 NOTE — Progress Notes (Signed)
  Radiation Oncology         [phone] ________________________________  Name: [name] MRN: [id number]  Date: 06/27/2018  DOB: 20-Oct-1950  Optical Surface Tracking Plan:  Since intensity modulated radiotherapy (IMRT) and 3D conformal radiation treatment methods are predicated on accurate and precise positioning for treatment, intrafraction motion monitoring is medically necessary to ensure accurate and safe treatment delivery.  The ability to quantify intrafraction motion without excessive ionizing radiation dose can only be performed with optical surface tracking. Accordingly, surface imaging offers the opportunity to obtain 3D measurements of patient position throughout IMRT and 3D treatments without excessive radiation exposure.  I am ordering optical surface tracking for this patient's upcoming course of radiotherapy. ________________________________  Kyung Rudd, MD 07/03/2018 10:03 AM    Reference:   Particia Jasper, et al. Surface imaging-based analysis of intrafraction motion for breast radiotherapy patients.Journal of [place], n. 6, nov. 2014. ISSN [id number].   Available at: <http://www.jacmp.org/index.php/jacmp/article/view/4957>.

## 2018-07-03 NOTE — Progress Notes (Signed)
  Radiation Oncology         [phone] ________________________________  Name: Marshe Shrestha MRN: [id number]  Date: 06/27/2018  DOB: [birth year]   DIAGNOSIS:     ICD-10-CM   1. Ductal carcinoma in situ (DCIS) of right breast D05.11   2. Ductal carcinoma in situ (DCIS) of left breast D05.12     SIMULATION AND TREATMENT PLANNING NOTE  The patient presented for simulation prior to beginning her course of radiation treatment for her diagnosis of bilateral breast cancer. The patient was placed in a supine position on a breast board. A customized vac-lock bag was constructed and this complex treatment device will be used on a daily basis during her treatment. In this fashion, a CT scan was obtained through the chest area and an isocenter was placed near the chest wall within the breast.  The patient will be planned to receive a course of radiation initially to a dose of 42.56 Gy to each breast. This will consist of a whole breast radiotherapy technique. To accomplish this, 2 customized blocks have been designed which will correspond to medial and lateral whole breast tangent fields, for each breast, for a total of 4 customized blocks. This treatment will be accomplished at 2.66 Gy per fraction. A forward planning technique will also be evaluated to determine if this approach improves the plan. It is anticipated that the patient will then receive a 8 Gy boost to each seroma cavity which has been contoured. This will be accomplished at 2 Gy per fraction.   This initial treatment will consist of a 3-D conformal technique. The seroma has been contoured as the primary target structure. Additionally, dose volume histograms of both this target as well as the lungs and heart will also be evaluated. Such an approach is necessary to ensure that the target area is adequately covered while the nearby critical  normal structures are adequately spared.  Plan:  The final anticipated total dose therefore will  correspond to 50.56 Gy.    _______________________________   Jodelle Gross, MD, PhD

## 2018-07-04 ENCOUNTER — Ambulatory Visit
Admission: RE | Admit: 2018-07-04 | Discharge: 2018-07-04 | Disposition: A | Discharge status: Home / Self Care | Payer: Medicare Other | Source: Ambulatory Visit | Attending: Radiation Oncology | Admitting: Radiation Oncology

## 2018-07-04 DIAGNOSIS — Z510 Encounter for antineoplastic radiation therapy: Secondary | ICD-10-CM | POA: Diagnosis not present

## 2018-07-04 DIAGNOSIS — D0512 Intraductal carcinoma in situ of left breast: Secondary | ICD-10-CM | POA: Diagnosis not present

## 2018-07-04 DIAGNOSIS — Z853 Personal history of malignant neoplasm of breast: Secondary | ICD-10-CM | POA: Diagnosis not present

## 2018-07-04 DIAGNOSIS — D0511 Intraductal carcinoma in situ of right breast: Secondary | ICD-10-CM | POA: Diagnosis not present

## 2018-07-05 ENCOUNTER — Ambulatory Visit
Admission: RE | Admit: 2018-07-05 | Discharge: 2018-07-05 | Disposition: A | Discharge status: Home / Self Care | Payer: Medicare Other | Source: Ambulatory Visit | Attending: Radiation Oncology | Admitting: Radiation Oncology

## 2018-07-05 DIAGNOSIS — D0511 Intraductal carcinoma in situ of right breast: Secondary | ICD-10-CM | POA: Diagnosis not present

## 2018-07-05 DIAGNOSIS — Z853 Personal history of malignant neoplasm of breast: Secondary | ICD-10-CM | POA: Diagnosis not present

## 2018-07-05 DIAGNOSIS — D0512 Intraductal carcinoma in situ of left breast: Secondary | ICD-10-CM | POA: Diagnosis not present

## 2018-07-05 DIAGNOSIS — Z510 Encounter for antineoplastic radiation therapy: Secondary | ICD-10-CM | POA: Diagnosis not present

## 2018-07-06 ENCOUNTER — Ambulatory Visit
Admission: RE | Admit: 2018-07-06 | Discharge: 2018-07-06 | Disposition: A | Discharge status: Home / Self Care | Payer: Medicare Other | Source: Ambulatory Visit | Attending: Radiation Oncology | Admitting: Radiation Oncology

## 2018-07-06 DIAGNOSIS — Z853 Personal history of malignant neoplasm of breast: Secondary | ICD-10-CM | POA: Diagnosis not present

## 2018-07-06 DIAGNOSIS — Z510 Encounter for antineoplastic radiation therapy: Secondary | ICD-10-CM | POA: Diagnosis not present

## 2018-07-06 DIAGNOSIS — D0512 Intraductal carcinoma in situ of left breast: Secondary | ICD-10-CM | POA: Diagnosis not present

## 2018-07-06 DIAGNOSIS — D0511 Intraductal carcinoma in situ of right breast: Secondary | ICD-10-CM | POA: Diagnosis not present

## 2018-07-07 ENCOUNTER — Other Ambulatory Visit: Payer: Self-pay | Admitting: Family Medicine

## 2018-07-07 ENCOUNTER — Ambulatory Visit
Admission: RE | Admit: 2018-07-07 | Discharge: 2018-07-07 | Disposition: A | Discharge status: Home / Self Care | Payer: Medicare Other | Source: Ambulatory Visit | Attending: Radiation Oncology | Admitting: Radiation Oncology

## 2018-07-07 DIAGNOSIS — D0511 Intraductal carcinoma in situ of right breast: Secondary | ICD-10-CM | POA: Diagnosis not present

## 2018-07-07 DIAGNOSIS — D0512 Intraductal carcinoma in situ of left breast: Secondary | ICD-10-CM | POA: Diagnosis not present

## 2018-07-07 DIAGNOSIS — Z853 Personal history of malignant neoplasm of breast: Secondary | ICD-10-CM | POA: Diagnosis not present

## 2018-07-07 DIAGNOSIS — Z510 Encounter for antineoplastic radiation therapy: Secondary | ICD-10-CM | POA: Diagnosis not present

## 2018-07-07 MED FILL — OMEPRAZOLE 40 MG CPDR: 40 | 90 days supply | Qty: 180 | Fill #1

## 2018-07-07 MED FILL — metFORMIN HCL 1000 MG TABS: 1000 | 30 days supply | Qty: 75 | Fill #0

## 2018-07-07 MED FILL — BISOPROLOL-HCTZ 5-6.25 MG T: 5-6.25 | 90 days supply | Qty: 90 | Fill #0

## 2018-07-10 ENCOUNTER — Ambulatory Visit: Payer: Medicare Other

## 2018-07-11 ENCOUNTER — Ambulatory Visit
Admission: RE | Admit: 2018-07-11 | Discharge: 2018-07-11 | Disposition: A | Discharge status: Home / Self Care | Payer: Medicare Other | Source: Ambulatory Visit | Attending: Radiation Oncology | Admitting: Radiation Oncology

## 2018-07-11 DIAGNOSIS — Z853 Personal history of malignant neoplasm of breast: Secondary | ICD-10-CM | POA: Diagnosis not present

## 2018-07-11 DIAGNOSIS — D0512 Intraductal carcinoma in situ of left breast: Secondary | ICD-10-CM | POA: Diagnosis not present

## 2018-07-11 DIAGNOSIS — D0511 Intraductal carcinoma in situ of right breast: Secondary | ICD-10-CM | POA: Diagnosis not present

## 2018-07-11 DIAGNOSIS — Z510 Encounter for antineoplastic radiation therapy: Secondary | ICD-10-CM | POA: Diagnosis not present

## 2018-07-12 ENCOUNTER — Ambulatory Visit: Payer: Medicare Other

## 2018-07-13 ENCOUNTER — Ambulatory Visit
Admission: RE | Admit: 2018-07-13 | Discharge: 2018-07-13 | Disposition: A | Discharge status: Home / Self Care | Payer: Medicare Other | Source: Ambulatory Visit | Attending: Radiation Oncology | Admitting: Radiation Oncology

## 2018-07-13 DIAGNOSIS — D0511 Intraductal carcinoma in situ of right breast: Secondary | ICD-10-CM | POA: Diagnosis not present

## 2018-07-13 DIAGNOSIS — Z510 Encounter for antineoplastic radiation therapy: Secondary | ICD-10-CM | POA: Diagnosis not present

## 2018-07-13 DIAGNOSIS — Z853 Personal history of malignant neoplasm of breast: Secondary | ICD-10-CM | POA: Diagnosis not present

## 2018-07-13 DIAGNOSIS — D0512 Intraductal carcinoma in situ of left breast: Secondary | ICD-10-CM | POA: Diagnosis not present

## 2018-07-14 ENCOUNTER — Ambulatory Visit
Admission: RE | Admit: 2018-07-14 | Discharge: 2018-07-14 | Disposition: A | Discharge status: Home / Self Care | Payer: Medicare Other | Source: Ambulatory Visit | Attending: Radiation Oncology | Admitting: Radiation Oncology

## 2018-07-14 DIAGNOSIS — D0512 Intraductal carcinoma in situ of left breast: Secondary | ICD-10-CM | POA: Diagnosis not present

## 2018-07-14 DIAGNOSIS — D0511 Intraductal carcinoma in situ of right breast: Secondary | ICD-10-CM | POA: Diagnosis not present

## 2018-07-14 DIAGNOSIS — Z853 Personal history of malignant neoplasm of breast: Secondary | ICD-10-CM | POA: Diagnosis not present

## 2018-07-14 DIAGNOSIS — Z510 Encounter for antineoplastic radiation therapy: Secondary | ICD-10-CM | POA: Diagnosis not present

## 2018-07-17 ENCOUNTER — Ambulatory Visit
Admission: RE | Admit: 2018-07-17 | Discharge: 2018-07-17 | Disposition: A | Discharge status: Home / Self Care | Payer: Medicare Other | Source: Ambulatory Visit | Attending: Radiation Oncology | Admitting: Radiation Oncology

## 2018-07-17 DIAGNOSIS — D0512 Intraductal carcinoma in situ of left breast: Secondary | ICD-10-CM | POA: Diagnosis not present

## 2018-07-17 DIAGNOSIS — Z510 Encounter for antineoplastic radiation therapy: Secondary | ICD-10-CM | POA: Insufficient documentation

## 2018-07-17 DIAGNOSIS — Z853 Personal history of malignant neoplasm of breast: Secondary | ICD-10-CM | POA: Diagnosis not present

## 2018-07-17 DIAGNOSIS — D0511 Intraductal carcinoma in situ of right breast: Secondary | ICD-10-CM | POA: Diagnosis not present

## 2018-07-18 ENCOUNTER — Ambulatory Visit
Admission: RE | Admit: 2018-07-18 | Discharge: 2018-07-18 | Disposition: A | Discharge status: Home / Self Care | Payer: Medicare Other | Source: Ambulatory Visit | Attending: Radiation Oncology | Admitting: Radiation Oncology

## 2018-07-18 DIAGNOSIS — Z510 Encounter for antineoplastic radiation therapy: Secondary | ICD-10-CM | POA: Diagnosis not present

## 2018-07-18 DIAGNOSIS — Z853 Personal history of malignant neoplasm of breast: Secondary | ICD-10-CM | POA: Diagnosis not present

## 2018-07-18 DIAGNOSIS — D0512 Intraductal carcinoma in situ of left breast: Secondary | ICD-10-CM | POA: Diagnosis not present

## 2018-07-18 DIAGNOSIS — D0511 Intraductal carcinoma in situ of right breast: Secondary | ICD-10-CM | POA: Diagnosis not present

## 2018-07-19 ENCOUNTER — Ambulatory Visit
Admission: RE | Admit: 2018-07-19 | Discharge: 2018-07-19 | Disposition: A | Discharge status: Home / Self Care | Payer: Medicare Other | Source: Ambulatory Visit | Attending: Radiation Oncology | Admitting: Radiation Oncology

## 2018-07-19 DIAGNOSIS — D0512 Intraductal carcinoma in situ of left breast: Secondary | ICD-10-CM | POA: Diagnosis not present

## 2018-07-19 DIAGNOSIS — Z510 Encounter for antineoplastic radiation therapy: Secondary | ICD-10-CM | POA: Diagnosis not present

## 2018-07-19 DIAGNOSIS — D0511 Intraductal carcinoma in situ of right breast: Secondary | ICD-10-CM | POA: Diagnosis not present

## 2018-07-19 DIAGNOSIS — Z853 Personal history of malignant neoplasm of breast: Secondary | ICD-10-CM | POA: Diagnosis not present

## 2018-07-20 ENCOUNTER — Ambulatory Visit
Admission: RE | Admit: 2018-07-20 | Discharge: 2018-07-20 | Disposition: A | Discharge status: Home / Self Care | Payer: Medicare Other | Source: Ambulatory Visit | Attending: Radiation Oncology | Admitting: Radiation Oncology

## 2018-07-20 DIAGNOSIS — D0511 Intraductal carcinoma in situ of right breast: Secondary | ICD-10-CM | POA: Diagnosis not present

## 2018-07-20 DIAGNOSIS — Z510 Encounter for antineoplastic radiation therapy: Secondary | ICD-10-CM | POA: Diagnosis not present

## 2018-07-20 DIAGNOSIS — Z853 Personal history of malignant neoplasm of breast: Secondary | ICD-10-CM | POA: Diagnosis not present

## 2018-07-20 DIAGNOSIS — D0512 Intraductal carcinoma in situ of left breast: Secondary | ICD-10-CM | POA: Diagnosis not present

## 2018-07-21 ENCOUNTER — Ambulatory Visit
Admission: RE | Admit: 2018-07-21 | Discharge: 2018-07-21 | Disposition: A | Discharge status: Home / Self Care | Payer: Medicare Other | Source: Ambulatory Visit | Attending: Radiation Oncology | Admitting: Radiation Oncology

## 2018-07-21 DIAGNOSIS — Z510 Encounter for antineoplastic radiation therapy: Secondary | ICD-10-CM | POA: Diagnosis not present

## 2018-07-21 DIAGNOSIS — D0511 Intraductal carcinoma in situ of right breast: Secondary | ICD-10-CM

## 2018-07-21 DIAGNOSIS — D0512 Intraductal carcinoma in situ of left breast: Secondary | ICD-10-CM | POA: Diagnosis not present

## 2018-07-21 DIAGNOSIS — Z853 Personal history of malignant neoplasm of breast: Secondary | ICD-10-CM | POA: Diagnosis not present

## 2018-07-21 MED ORDER — RADIAPLEXRX EX GEL
Freq: Once | CUTANEOUS | Status: AC
  Administered 2018-07-21: 15:00:00 via TOPICAL

## 2018-07-24 ENCOUNTER — Ambulatory Visit: Payer: Medicare Other

## 2018-07-24 ENCOUNTER — Ambulatory Visit
Admission: RE | Admit: 2018-07-24 | Discharge: 2018-07-24 | Disposition: A | Discharge status: Home / Self Care | Payer: Medicare Other | Source: Ambulatory Visit | Attending: Radiation Oncology | Admitting: Radiation Oncology

## 2018-07-24 DIAGNOSIS — D0512 Intraductal carcinoma in situ of left breast: Secondary | ICD-10-CM | POA: Diagnosis not present

## 2018-07-24 DIAGNOSIS — Z510 Encounter for antineoplastic radiation therapy: Secondary | ICD-10-CM | POA: Diagnosis not present

## 2018-07-24 DIAGNOSIS — D0511 Intraductal carcinoma in situ of right breast: Secondary | ICD-10-CM | POA: Diagnosis not present

## 2018-07-24 DIAGNOSIS — Z853 Personal history of malignant neoplasm of breast: Secondary | ICD-10-CM | POA: Diagnosis not present

## 2018-07-25 ENCOUNTER — Ambulatory Visit
Admission: RE | Admit: 2018-07-25 | Discharge: 2018-07-25 | Disposition: A | Discharge status: Home / Self Care | Payer: Medicare Other | Source: Ambulatory Visit | Attending: Radiation Oncology | Admitting: Radiation Oncology

## 2018-07-25 ENCOUNTER — Ambulatory Visit: Payer: Medicare Other

## 2018-07-25 DIAGNOSIS — D0512 Intraductal carcinoma in situ of left breast: Secondary | ICD-10-CM | POA: Diagnosis not present

## 2018-07-25 DIAGNOSIS — Z853 Personal history of malignant neoplasm of breast: Secondary | ICD-10-CM | POA: Diagnosis not present

## 2018-07-25 DIAGNOSIS — D0511 Intraductal carcinoma in situ of right breast: Secondary | ICD-10-CM | POA: Diagnosis not present

## 2018-07-25 DIAGNOSIS — Z510 Encounter for antineoplastic radiation therapy: Secondary | ICD-10-CM | POA: Diagnosis not present

## 2018-07-26 ENCOUNTER — Ambulatory Visit: Admission: RE | Admit: 2018-07-26 | Payer: Medicare Other | Source: Ambulatory Visit

## 2018-07-26 ENCOUNTER — Ambulatory Visit: Payer: Medicare Other

## 2018-07-27 ENCOUNTER — Ambulatory Visit: Payer: Medicare Other

## 2018-07-27 ENCOUNTER — Ambulatory Visit
Admission: RE | Admit: 2018-07-27 | Discharge: 2018-07-27 | Disposition: A | Discharge status: Home / Self Care | Payer: Medicare Other | Source: Ambulatory Visit | Attending: Radiation Oncology | Admitting: Radiation Oncology

## 2018-07-27 DIAGNOSIS — D0511 Intraductal carcinoma in situ of right breast: Secondary | ICD-10-CM | POA: Diagnosis not present

## 2018-07-27 DIAGNOSIS — D0512 Intraductal carcinoma in situ of left breast: Secondary | ICD-10-CM | POA: Diagnosis not present

## 2018-07-27 DIAGNOSIS — Z510 Encounter for antineoplastic radiation therapy: Secondary | ICD-10-CM | POA: Diagnosis not present

## 2018-07-27 DIAGNOSIS — Z853 Personal history of malignant neoplasm of breast: Secondary | ICD-10-CM | POA: Diagnosis not present

## 2018-07-28 ENCOUNTER — Ambulatory Visit
Admission: RE | Admit: 2018-07-28 | Discharge: 2018-07-28 | Disposition: A | Discharge status: Home / Self Care | Payer: Medicare Other | Source: Ambulatory Visit | Attending: Radiation Oncology | Admitting: Radiation Oncology

## 2018-07-28 ENCOUNTER — Ambulatory Visit: Payer: Medicare Other

## 2018-07-28 DIAGNOSIS — Z510 Encounter for antineoplastic radiation therapy: Secondary | ICD-10-CM | POA: Diagnosis not present

## 2018-07-28 DIAGNOSIS — D0511 Intraductal carcinoma in situ of right breast: Secondary | ICD-10-CM | POA: Diagnosis not present

## 2018-07-28 DIAGNOSIS — D0512 Intraductal carcinoma in situ of left breast: Secondary | ICD-10-CM | POA: Diagnosis not present

## 2018-07-28 DIAGNOSIS — Z853 Personal history of malignant neoplasm of breast: Secondary | ICD-10-CM | POA: Diagnosis not present

## 2018-07-31 ENCOUNTER — Ambulatory Visit
Admission: RE | Admit: 2018-07-31 | Discharge: 2018-07-31 | Disposition: A | Discharge status: Home / Self Care | Payer: Medicare Other | Source: Ambulatory Visit | Attending: Radiation Oncology | Admitting: Radiation Oncology

## 2018-07-31 ENCOUNTER — Encounter: Payer: Self-pay | Admitting: Radiation Oncology

## 2018-07-31 DIAGNOSIS — Z510 Encounter for antineoplastic radiation therapy: Secondary | ICD-10-CM | POA: Diagnosis not present

## 2018-07-31 DIAGNOSIS — D0511 Intraductal carcinoma in situ of right breast: Secondary | ICD-10-CM | POA: Diagnosis not present

## 2018-07-31 DIAGNOSIS — D0512 Intraductal carcinoma in situ of left breast: Secondary | ICD-10-CM | POA: Diagnosis not present

## 2018-07-31 DIAGNOSIS — Z853 Personal history of malignant neoplasm of breast: Secondary | ICD-10-CM | POA: Diagnosis not present

## 2018-08-10 ENCOUNTER — Ambulatory Visit (INDEPENDENT_AMBULATORY_CARE_PROVIDER_SITE_OTHER): Payer: Medicare Other | Admitting: Family Medicine

## 2018-08-10 ENCOUNTER — Encounter: Payer: Self-pay | Admitting: Family Medicine

## 2018-08-10 DIAGNOSIS — D0511 Intraductal carcinoma in situ of right breast: Secondary | ICD-10-CM | POA: Diagnosis not present

## 2018-08-10 DIAGNOSIS — E1165 Type 2 diabetes mellitus with hyperglycemia: Secondary | ICD-10-CM

## 2018-08-10 DIAGNOSIS — I10 Essential (primary) hypertension: Secondary | ICD-10-CM

## 2018-08-10 DIAGNOSIS — F418 Other specified anxiety disorders: Secondary | ICD-10-CM | POA: Diagnosis not present

## 2018-08-10 MED ORDER — GLIMEPIRIDE 2 MG PO TABS: 2.0000 mg | ORAL_TABLET | Freq: Two times a day (BID) | ORAL | 3 refills | Status: DC

## 2018-08-10 MED ORDER — CANAGLIFLOZIN 100 MG PO TABS: 100.0000 mg | ORAL_TABLET | Freq: Every day | ORAL | Status: DC

## 2018-08-10 MED FILL — GLIMEPIRIDE 2 MG TABLET: 2 | 30 days supply | Qty: 120 | Fill #0

## 2018-08-10 NOTE — Patient Instructions (Addendum)
Melatonin up to 10 mg at bed  L Tyryptophan capsule for sleep Unisom is another   Carbohydrate Counting for Diabetes Mellitus, Adult  Carbohydrate counting is a method of keeping track of how many carbohydrates you eat. Eating carbohydrates naturally increases the amount of sugar (glucose) in the blood. Counting how many carbohydrates you eat helps keep your blood glucose within normal limits, which helps you manage your diabetes (diabetes mellitus). It is important to know how many carbohydrates you can safely have in each meal. This is different for every person. A diet and nutrition specialist (registered dietitian) can help you make a meal plan and calculate how many carbohydrates you should have at each meal and snack. Carbohydrates are found in the following foods:  Grains, such as breads and cereals.  Dried beans and soy products.  Starchy vegetables, such as potatoes, peas, and corn.  Fruit and fruit juices.  Milk and yogurt.  Sweets and snack foods, such as cake, cookies, candy, chips, and soft drinks. How do I count carbohydrates? There are two ways to count carbohydrates in food. You can use either of the methods or a combination of both. Reading "Nutrition Facts" on packaged food The "Nutrition Facts" list is included on the labels of almost all packaged foods and beverages in the [place] It includes:  The serving size.  Information about nutrients in each serving, including the grams (g) of carbohydrate per serving. To use the "Nutrition Facts":  Decide how many servings you will have.  Multiply the number of servings by the number of carbohydrates per serving.  The resulting number is the total amount of carbohydrates that you will be having. Learning standard serving sizes of other foods When you eat carbohydrate foods that are not packaged or do not include "Nutrition Facts" on the label, you need to measure the servings in order to count the amount of  carbohydrates:  Measure the foods that you will eat with a food scale or measuring cup, if needed.  Decide how many standard-size servings you will eat.  Multiply the number of servings by 15. Most carbohydrate-rich foods have about 15 g of carbohydrates per serving. ? For example, if you eat 8 oz (170 g) of strawberries, you will have eaten 2 servings and 30 g of carbohydrates (2 servings x 15 g = 30 g).  For foods that have more than one food mixed, such as soups and casseroles, you must count the carbohydrates in each food that is included. The following list contains standard serving sizes of common carbohydrate-rich foods. Each of these servings has about 15 g of carbohydrates:   hamburger bun or  English muffin.   oz (15 mL) syrup.   oz (14 g) jelly.  1 slice of bread.  1 six-inch tortilla.  3 oz (85 g) cooked rice or pasta.  4 oz (113 g) cooked dried beans.  4 oz (113 g) starchy vegetable, such as peas, corn, or potatoes.  4 oz (113 g) hot cereal.  4 oz (113 g) mashed potatoes or  of a large baked potato.  4 oz (113 g) canned or frozen fruit.  4 oz (120 mL) fruit juice.  4-6 crackers.  6 chicken nuggets.  6 oz (170 g) unsweetened dry cereal.  6 oz (170 g) plain fat-free yogurt or yogurt sweetened with artificial sweeteners.  8 oz (240 mL) milk.  8 oz (170 g) fresh fruit or one small piece of fruit.  24 oz (680 g) popped popcorn.  Example of carbohydrate counting Sample meal  3 oz (85 g) chicken breast.  6 oz (170 g) brown rice.  4 oz (113 g) corn.  8 oz (240 mL) milk.  8 oz (170 g) strawberries with sugar-free whipped topping. Carbohydrate calculation 1. Identify the foods that contain carbohydrates: ? Rice. ? Corn. ? Milk. ? Strawberries. 2. Calculate how many servings you have of each food: ? 2 servings rice. ? 1 serving corn. ? 1 serving milk. ? 1 serving strawberries. 3. Multiply each number of servings by 15 g: ? 2 servings rice  x 15 g = 30 g. ? 1 serving corn x 15 g = 15 g. ? 1 serving milk x 15 g = 15 g. ? 1 serving strawberries x 15 g = 15 g. 4. Add together all of the amounts to find the total grams of carbohydrates eaten: ? 30 g + 15 g + 15 g + 15 g = 75 g of carbohydrates total. Summary  Carbohydrate counting is a method of keeping track of how many carbohydrates you eat.  Eating carbohydrates naturally increases the amount of sugar (glucose) in the blood.  Counting how many carbohydrates you eat helps keep your blood glucose within normal limits, which helps you manage your diabetes.  A diet and nutrition specialist (registered dietitian) can help you make a meal plan and calculate how many carbohydrates you should have at each meal and snack. This information is not intended to replace advice given to you by your health care provider. Make sure you discuss any questions you have with your health care provider. Document Released: 05/31/2005 Document Revised: 12/08/2016 Document Reviewed: 11/12/2015 Elsevier Interactive Patient Education  2019 [employer].

## 2018-08-13 DIAGNOSIS — Z923 Personal history of irradiation: Secondary | ICD-10-CM

## 2018-08-13 HISTORY — DX: Personal history of irradiation: Z92.3

## 2018-08-13 NOTE — Assessment & Plan Note (Signed)
Well controlled, no changes to meds. Encouraged heart healthy diet such as the DASH diet and exercise as tolerated.  °

## 2018-08-13 NOTE — Progress Notes (Signed)
Subjective:    Patient ID: [name], female    DOB: 1950/10/06, 68 y.o.   MRN: [id number]  No chief complaint on file.   HPI Patient is in today for follow-up.  She is happy to be done with her radiation does note it caused excessive fatigue but she has managed it well.  She has had some trouble with burns and peeling of her skin as well but feels that is healing as well.  It is been a while since she was able to see endocrinology due to the overwhelming nature of her radiation treatments.  She has not checked her sugars regularly when she does they can run into the 200s up as high as 300 and occasionally down into the high 100s.  No complaints of polyuria or polydipsia.  No recent febrile illness or hospitalization.  She feels she is managing the stress of her treatments fairly well. Denies CP/palp/SOB/HA/congestion/fevers/GI or GU c/o. Taking meds as prescribed  Past Medical History:  Diagnosis Date  . Acute bronchitis 10/31/2015  . Allergy    seasonal  . Arthritis    knee-left  . Cancer ([place]) 04/2018   right breast ca  . Cataract    right eye  . Diabetes mellitus 52   type 2  . Diaphragmatic hernia without mention of obstruction or gangrene   . Endometriosis   . Esophageal reflux   . Esophageal stricture   . GERD (gastroesophageal reflux disease)   . Hyperlipidemia   . Hyperplastic colon polyp   . Hypertension 52  . Nonalcoholic fatty liver disease 04/28/2010   Qualifier: Diagnosis of  By: Arnoldo Morale MD, Balinda Quails   . Nonspecific abnormal results of liver function study   . Pharyngitis 02/18/2012  . Plantar fascial fibromatosis   . Preventative health care 01/22/2013  . Seasonal allergies 10/31/2015  . Sleep apnea    has oral appliance but does not fit well due to missing teeth  . Sore throat 04/03/2017  . Tubular adenoma of colon   . Unspecified sleep apnea    no c-pap    Past Surgical History:  Procedure Laterality Date  . ABDOMINAL HYSTERECTOMY  1982   partial  .  APPENDECTOMY    . 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 BREAST CYST INCISION AND DRAINAGE     right breast  . BREAST LUMPECTOMY WITH RADIOACTIVE SEED LOCALIZATION Bilateral 05/02/2018   Procedure: RIGHT BREAST SEED LUMPECTOMY X2 AND LEFT BREAST SEED GUIDED LUMPECTOMY;  Surgeon: [name], MD;  Location: [facility];  Service: General;  Laterality: Bilateral;  . CATARACT EXTRACTION     right eye  . HERNIA REPAIR     per pt, she is not aware of this surgery  . RE-EXCISION OF BREAST LUMPECTOMY Right 05/25/2018   Procedure: RE-EXCISION OF RIGHT BREAST LUMPECTOMY;  Surgeon: [name], MD;  Location: [facility];  Service: General;  Laterality: Right;  . seba    . TUBAL LIGATION      Family History  Problem Relation Age of Onset  . Breast cancer Mother   . Hypertension Mother   . Diabetes Mother        type 2  . Other Mother        esophagus surgery  . Heart disease Father   . Hyperlipidemia Father   . Hypertension Father   . Heart attack Father        MI at age 46  .  Diabetes Brother        type 2  . Heart disease Maternal Grandmother   . Heart attack Maternal Grandfather   . Hypertension Maternal Grandfather   . Stroke Paternal Grandmother   . Hypertension Sister   . Diabetes Sister   . Hypertension Brother   . Alcohol abuse Maternal Aunt   . Cancer Maternal Aunt   . Colon cancer Neg Hx   . Stomach cancer Neg Hx   . Esophageal cancer Neg Hx     Social History   Socioeconomic History  . Marital status: Married    Spouse name: [name]  . Number of children: 2  . Years of education: Not on file  . Highest education level: Not on file  Occupational History  . Occupation: Building surveyor: [employer]  . Financial resource strain: Not on file  . Food insecurity:    Worry: Not on file    Inability: Not on file  . Transportation needs:    Medical: No    Non-medical: No  Tobacco Use  . Smoking status:  Never Smoker  . Smokeless tobacco: Never Used  Substance and Sexual Activity  . Alcohol use: No    Alcohol/week: 0.0 standard drinks  . Drug use: No  . Sexual activity: Yes    Birth control/protection: Post-menopausal    Comment: lives with husband, no dietary restrictions just watching carbs, wears seat belt  Lifestyle  . Physical activity:    Days per week: Not on file    Minutes per session: Not on file  . Stress: Not on file  Relationships  . Social connections:    Talks on phone: Not on file    Gets together: Not on file    Attends religious service: Not on file    Active member of club or organization: Not on file    Attends meetings of clubs or organizations: Not on file    Relationship status: Not on file  . Intimate partner violence:    Fear of current or ex partner: No    Emotionally abused: No    Physically abused: No    Forced sexual activity: No  Other Topics Concern  . Not on file  Social History Narrative  . Not on file    Outpatient Medications Prior to Visit  Medication Sig Dispense Refill  . ASPIR-LOW 81 MG EC tablet TAKE 1 TABLET BY MOUTH DAILY 30 tablet 3  . bisoprolol-hydrochlorothiazide (ZIAC) 5-6.25 MG tablet TAKE 1 TABLET BY MOUTH DAILY. 90 tablet 0  . glucose blood (CONTOUR NEXT TEST) test strip USE TO CHECK BLOOD SUGAR DAILY AS NEEDED DX:E11.9 100 each 5  . metFORMIN (GLUCOPHAGE) 1000 MG tablet TAKE 1 TABLET BY MOUTH TWICE A DAY AND TAKE 1/2 TABLET AT NOON 75 tablet 1  . omeprazole (PRILOSEC) 40 MG capsule Take 1 capsule (40 mg total) by mouth 2 (two) times daily. 180 capsule 3  . canagliflozin (INVOKANA) 100 MG TABS tablet Take 100 mg by mouth daily before breakfast.    . glimepiride (AMARYL) 1 MG tablet Take 1 mg in am and 2 mg before a larger dinner. 270 tablet 3  . meclizine (ANTIVERT) 25 MG tablet Take 1 tablet (25 mg total) by mouth 3 (three) times daily as needed for dizziness. 15 tablet 0   Facility-Administered Medications Prior to Visit    Medication Dose Route Frequency Provider Last Rate Last Dose  . Chlorhexidine Gluconate Cloth 2 %  PADS 6 each  6 each Topical Once [name], MD       And  . Chlorhexidine Gluconate Cloth 2 % PADS 6 each  6 each Topical Once [name], MD        Allergies  Allergen Reactions  . Pravastatin Other (See Comments)    Myalgia, fatigue, memory loss  . Amoxicillin Itching    REACTION: rash  . Atorvastatin Other (See Comments)    Myalgia, fatigue, memory loss  . Codeine Nausea And Vomiting    Review of Systems  Constitutional: Positive for malaise/fatigue. Negative for fever.  HENT: Negative for congestion.   Eyes: Negative for blurred vision.  Respiratory: Negative for shortness of breath.   Cardiovascular: Negative for chest pain, palpitations and leg swelling.  Gastrointestinal: Negative for abdominal pain, blood in stool and nausea.  Genitourinary: Negative for dysuria and frequency.  Musculoskeletal: Negative for falls.  Skin: Negative for rash.  Neurological: Negative for dizziness, loss of consciousness and headaches.  Endo/Heme/Allergies: Negative for environmental allergies.  Psychiatric/Behavioral: Negative for depression. The patient is not nervous/anxious.        Objective:    Physical Exam Vitals signs and nursing note reviewed.  Constitutional:      General: She is not in acute distress.    Appearance: She is well-developed.  HENT:     Head: Normocephalic and atraumatic.     Nose: Nose normal.  Eyes:     General:        Right eye: No discharge.        Left eye: No discharge.  Neck:     Musculoskeletal: Normal range of motion and neck supple.  Cardiovascular:     Rate and Rhythm: Normal rate and regular rhythm.     Heart sounds: No murmur.  Pulmonary:     Effort: Pulmonary effort is normal.     Breath sounds: Normal breath sounds.  Abdominal:     General: Bowel sounds are normal.     Palpations: Abdomen is soft.     Tenderness: There is no  abdominal tenderness.  Skin:    General: Skin is warm and dry.  Neurological:     Mental Status: She is alert and oriented to person, place, and time.     BP 118/70 (BP Location: Left Arm, Patient Position: Sitting, Cuff Size: Normal)   Pulse 76   Temp 98.4 F (36.9 C) (Oral)   Resp 18   Wt 188 lb 12.8 oz (85.6 kg)   SpO2 97%   BMI 33.44 kg/m  Wt Readings from Last 3 Encounters:  08/10/18 188 lb 12.8 oz (85.6 kg)  06/22/18 190 lb (86.2 kg)  05/25/18 186 lb 11.7 oz (84.7 kg)     Lab Results  Component Value Date   WBC 7.0 05/09/2018   HGB 13.1 05/09/2018   HCT 40.2 05/09/2018   PLT 304.0 05/09/2018   GLUCOSE 96 05/09/2018   CHOL 227 (H) 05/09/2018   TRIG 190.0 (H) 05/09/2018   HDL 37.50 (L) 05/09/2018   LDLCALC 152 (H) 05/09/2018   ALT 57 (H) 05/09/2018   AST 51 (H) 05/09/2018   NA 138 05/09/2018   K 4.1 05/09/2018   CL 99 05/09/2018   CREATININE 0.81 05/09/2018   BUN 14 05/09/2018   CO2 28 05/09/2018   TSH 0.97 05/09/2018   HGBA1C 7.8 (H) 05/09/2018   MICROALBUR <0.7 12/04/2015    Lab Results  Component Value Date   TSH 0.97 05/09/2018   Lab  Results  Component Value Date   WBC 7.0 05/09/2018   HGB 13.1 05/09/2018   HCT 40.2 05/09/2018   MCV 79.8 05/09/2018   PLT 304.0 05/09/2018   Lab Results  Component Value Date   NA 138 05/09/2018   K 4.1 05/09/2018   CO2 28 05/09/2018   GLUCOSE 96 05/09/2018   BUN 14 05/09/2018   CREATININE 0.81 05/09/2018   BILITOT 0.5 05/09/2018   ALKPHOS 86 05/09/2018   AST 51 (H) 05/09/2018   ALT 57 (H) 05/09/2018   PROT 7.3 05/09/2018   ALBUMIN 4.2 05/09/2018   CALCIUM 9.7 05/09/2018   ANIONGAP 11 04/26/2018   GFR 91.07 05/09/2018   Lab Results  Component Value Date   CHOL 227 (H) 05/09/2018   Lab Results  Component Value Date   HDL 37.50 (L) 05/09/2018   Lab Results  Component Value Date   LDLCALC 152 (H) 05/09/2018   Lab Results  Component Value Date   TRIG 190.0 (H) 05/09/2018   Lab Results    Component Value Date   CHOLHDL 6 05/09/2018   Lab Results  Component Value Date   HGBA1C 7.8 (H) 05/09/2018       Assessment & Plan:   Problem List Items Addressed This Visit    Type 2 diabetes mellitus with hyperglycemia, without long-term current use of insulin (HCC)    hgba1c acceptable, minimize simple carbs. Increase exercise as tolerated. She is struggling to afford her Invokana so she stopped taking it about a month ago. She has not been checking her sugars regularly. She has had a hard time getting back to endocrinology but she agrees to try.       Relevant Medications   glimepiride (AMARYL) 2 MG tablet   canagliflozin (INVOKANA) 100 MG TABS tablet   Depression with anxiety    This process has been stressful but she has good social support and she feels she has managed the stress well      Essential hypertension    Well controlled, no changes to meds. Encouraged heart healthy diet such as the DASH diet and exercise as tolerated.       Ductal carcinoma in situ (DCIS) of right breast    Has just finished her last radiation treatment and has a follow up appointment with oncology to confirm she does not need chemo. She has tolerated treatments well         I have discontinued Joycelyn Schmid Cozart's glimepiride and meclizine. I have also changed her canagliflozin. Additionally, I am having her start on glimepiride. Lastly, I am having her maintain her ASPIR-LOW, omeprazole, glucose blood, metFORMIN, and bisoprolol-hydrochlorothiazide.  Meds ordered this encounter  Medications  . glimepiride (AMARYL) 2 MG tablet    Sig: Take 1-2 tablets (2-4 mg total) by mouth 2 (two) times daily.    Dispense:  120 tablet    Refill:  3  . canagliflozin (INVOKANA) 100 MG TABS tablet    Sig: Take 1 tablet (100 mg total) by mouth daily before breakfast. Patient stopped in January due to cost     [name], MD

## 2018-08-13 NOTE — Assessment & Plan Note (Signed)
This process has been stressful but she has good social support and she feels she has managed the stress well

## 2018-08-13 NOTE — Assessment & Plan Note (Addendum)
hgba1c acceptable, minimize simple carbs. Increase exercise as tolerated. She is struggling to afford her Invokana so she stopped taking it about a month ago. She has not been checking her sugars regularly. She has had a hard time getting back to endocrinology but she agrees to try.

## 2018-08-13 NOTE — Progress Notes (Signed)
[place]  [phone] [phone]    ID: [name] DOB: Oct 12, 1950  MR#: [id number]  OAC#:[id number]  Patient Care Team: Mosie Lukes, MD as PCP - General (Family Medicine) Jola Schmidt, MD as Consulting Physician (Ophthalmology) Royston Sinner, Colin Benton, MD as Consulting Physician (Obstetrics and Gynecology) [name], MD as Consulting Physician (General Surgery) Makenzee Choudhry, Virgie Dad, MD as Consulting Physician (Oncology) Philemon Kingdom, MD as Consulting Physician (Internal Medicine) Dene Gentry, MD as Consulting Physician (Sports Medicine) OTHER MD:   CHIEF COMPLAINT: Estrogen receptor negative ductal carcinoma in situ  CURRENT TREATMENT: Observation   HISTORY OF CURRENT ILLNESS: From the original intake note:  [name] had routine screening mammography on 02/20/2018 at [facility] showing a possible abnormality in the right breast. She underwent unilateral right diagnostic mammography with tomography and right breast ultrasonography at [facility] on 03/10/2018 showing: breast density category C. There is a cluster of heterogeneous calcifications in the right breast upper central quadrant. There was no sonographic abnormalities or lymphadenopathy in the right axilla.  Accordingly on 03/07/2018 she proceeded to biopsy of the right breast area in question. The pathology from this procedure showed ([id number]): Ductal carcinoma in situ, high grade. There are foci suspicious for early stromal invasion. Prognostic indicators significant for: both estrogen receptor and progesterone receptor, 0% negative.  The patient's subsequent history is as detailed below.   INTERVAL HISTORY: [name] returns today for follow-up and treatment of her estrogen receptor negative ductal carcinoma in situ.   Since her last visit here, she underwent a right lumpectomy on 05/02/2018. The pathology from this procedure showed ([id number]):  1. Breast,  lumpectomy, right 10 o'clock - lobular neoplasia (atypical lobular hyperplasia). - healing biopsy site. - see comment. 2. Breast, lumpectomy, right 12 o'clock - ductal carcinoma in situ with calcifications, intermediate grade, spanning 0.9 cm. - ductal carcinoma in situ is focally present at the posterior margin of specimen 2. - see oncology table below. 3. Breast, excision, right 12 o'clock additional medial margin - fibroadenoma. - fibrocystic changes. - there is no evidence of malignancy. - see comment. 4. Breast, lumpectomy, left - intraductal papilloma. - healing biopsy site. - there is no evidence of malignancy.   - see comment.  She also underwent re-excision of the right breast on 05/25/2018. The pathology from this procedure showed ([id number]):  Breast, excision, right  - benign breast parenchyma with previous procedure-related changes.   - negative for in situ invasive carcinoma.  Finally, she completed radiation on 07/31/2018. She had some peeling and darkening, and she is still recovering.    REVIEW OF SYSTEMS: [name] had very little pain from her surgeries. She has no bleeding or fever. She is fatigued, but she is able to maintain her normal life activities. She sleeps well. [name] has changed up her diet, and has tried to mostly eat vegetables. The patient denies unusual headaches, visual changes, nausea, vomiting, or dizziness. There has been no unusual cough, phlegm production, or pleurisy. This been no change in bowel or bladder habits. The patient denies unexplained fatigue or unexplained weight loss, bleeding, rash, or fever. A detailed review of systems was otherwise noncontributory.    PAST MEDICAL HISTORY: Past Medical History:  Diagnosis Date  . Acute bronchitis 10/31/2015  . Allergy    seasonal  . Arthritis    knee-left  . Cancer ([place]) 04/2018   right breast ca  . Cataract    right eye  . Diabetes mellitus 52   type 2  .  Diaphragmatic hernia  without mention of obstruction or gangrene   . Endometriosis   . Esophageal reflux   . Esophageal stricture   . GERD (gastroesophageal reflux disease)   . Hyperlipidemia   . Hyperplastic colon polyp   . Hypertension 52  . Nonalcoholic fatty liver disease 04/28/2010   Qualifier: Diagnosis of  By: Arnoldo Morale MD, Balinda Quails   . Nonspecific abnormal results of liver function study   . Pharyngitis 02/18/2012  . Plantar fascial fibromatosis   . Preventative health care 01/22/2013  . Seasonal allergies 10/31/2015  . Sleep apnea    has oral appliance but does not fit well due to missing teeth  . Sore throat 04/03/2017  . Tubular adenoma of colon   . Unspecified sleep apnea    no c-pap    PAST SURGICAL HISTORY: Past Surgical History:  Procedure Laterality Date  . ABDOMINAL HYSTERECTOMY  1982   partial  . APPENDECTOMY    . 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 BREAST CYST INCISION AND DRAINAGE     right breast  . BREAST LUMPECTOMY WITH RADIOACTIVE SEED LOCALIZATION Bilateral 05/02/2018   Procedure: RIGHT BREAST SEED LUMPECTOMY X2 AND LEFT BREAST SEED GUIDED LUMPECTOMY;  Surgeon: [name], MD;  Location: [place];  Service: General;  Laterality: Bilateral;  . CATARACT EXTRACTION     right eye  . HERNIA REPAIR     per pt, she is not aware of this surgery  . RE-EXCISION OF BREAST LUMPECTOMY Right 05/25/2018   Procedure: RE-EXCISION OF RIGHT BREAST LUMPECTOMY;  Surgeon: [name], MD;  Location: [place];  Service: General;  Laterality: Right;  . seba    . TUBAL LIGATION    Partial hysterectomy   FAMILY HISTORY: Family History  Problem Relation Age of Onset  . Breast cancer Mother   . Hypertension Mother   . Diabetes Mother        type 2  . Other Mother        esophagus surgery  . Heart disease Father   . Hyperlipidemia Father   . Hypertension Father   . Heart attack Father        MI at age 70  . Diabetes Brother        type 2  . Heart  disease Maternal Grandmother   . Heart attack Maternal Grandfather   . Hypertension Maternal Grandfather   . Stroke Paternal Grandmother   . Hypertension Sister   . Diabetes Sister   . Hypertension Brother   . Alcohol abuse Maternal Aunt   . Cancer Maternal Aunt   . Colon cancer Neg Hx   . Stomach cancer Neg Hx   . Esophageal cancer Neg Hx    The patient's father died at age 23 due to CHF. The patient's mother died at age 71 due to breast cancer diagnosed at age 57. The patient had 3 brothers and 3 sisters. There was a paternal aunt with breast cancer diagnosed in the 43's. There was also a maternal aunt with ovarian cancer that passed away. The patient notes that she had prior genetics testing in 2018 for ovarian and breast under Dr. Royston Sinner with normal results.    GYNECOLOGIC HISTORY:  No LMP recorded. Patient has had a hysterectomy. Menarche: 68 years old Age at first live birth: 68 years old She is [place] P2.  She is status post partial hysterectomy without BSO. Her LMP: was in 23 ( late 30's). She  took hormone replacement for 3 years. She also took oral contraception for 3 years with no complications.    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 Her husband, [name] "Dexter" works in transportation and also worked as a Dealer. The patient's daughter, Anderson Malta lives in [place] and is a Hotel manager. The patient's daughter, Doroteo Bradford lives in [place] and works as a Engineer, mining. The patient has 2 grandchildren and 2 great grandchildren.   ADVANCED DIRECTIVES:    HEALTH MAINTENANCE: Social History   Tobacco Use  . Smoking status: Never Smoker  . Smokeless tobacco: Never Used  Substance Use Topics  . Alcohol use: No    Alcohol/week: 0.0 standard drinks  . Drug use: No     Colonoscopy: 11/18/2015/ Dr. Fuller Plan  PAP: 03/2017  Bone density: 2018   Allergies  Allergen Reactions  . Pravastatin Other (See Comments)    Myalgia, fatigue,  memory loss  . Amoxicillin Itching    REACTION: rash  . Atorvastatin Other (See Comments)    Myalgia, fatigue, memory loss  . Codeine Nausea And Vomiting    Current Outpatient Medications  Medication Sig Dispense Refill  . ASPIR-LOW 81 MG EC tablet TAKE 1 TABLET BY MOUTH DAILY 30 tablet 3  . bisoprolol-hydrochlorothiazide (ZIAC) 5-6.25 MG tablet TAKE 1 TABLET BY MOUTH DAILY. 90 tablet 0  . canagliflozin (INVOKANA) 100 MG TABS tablet Take 1 tablet (100 mg total) by mouth daily before breakfast. Patient stopped in January due to cost    . glimepiride (AMARYL) 2 MG tablet Take 1-2 tablets (2-4 mg total) by mouth 2 (two) times daily. 120 tablet 3  . glucose blood (CONTOUR NEXT TEST) test strip USE TO CHECK BLOOD SUGAR DAILY AS NEEDED DX:E11.9 100 each 5  . metFORMIN (GLUCOPHAGE) 1000 MG tablet TAKE 1 TABLET BY MOUTH TWICE A DAY AND TAKE 1/2 TABLET AT NOON 75 tablet 1  . omeprazole (PRILOSEC) 40 MG capsule Take 1 capsule (40 mg total) by mouth 2 (two) times daily. 180 capsule 3   No current facility-administered medications for this visit.    Facility-Administered Medications Ordered in Other Visits  Medication Dose Route Frequency Provider Last Rate Last Dose  . Chlorhexidine Gluconate Cloth 2 % PADS 6 each  6 each Topical Once [name], MD       And  . Chlorhexidine Gluconate Cloth 2 % PADS 6 each  6 each Topical Once [name], MD        OBJECTIVE: Middle-aged white woman who appears stated age  89:   08/14/18 1450  BP: 137/76  Pulse: 74  Resp: 18  Temp: 98.5 F (36.9 C)  SpO2: 99%     Body mass index is 32.72 kg/m.   Wt Readings from Last 3 Encounters:  08/14/18 184 lb 11.2 oz (83.8 kg)  08/10/18 188 lb 12.8 oz (85.6 kg)  06/22/18 190 lb (86.2 kg)      ECOG FS:1 - Symptomatic but completely ambulatory  Sclerae unicteric, EOMs intact No cervical or supraclavicular adenopathy Lungs no rales or rhonchi Heart regular rate and rhythm Abd soft, obese,  nontender, positive bowel sounds MSK no focal spinal tenderness, no upper extremity lymphedema Neuro: nonfocal, well oriented, appropriate affect Breasts: Status post bilateral lumpectomies and bilateral radiation.  There is still dry desquamation bilaterally.  There is significant hyperpigmentation.  There is no evidence of residual or recurrent disease.  Both axillae are benign.    LAB RESULTS:  CMP  Component Value Date/Time   NA 138 05/09/2018 1407   K 4.1 05/09/2018 1407   CL 99 05/09/2018 1407   CO2 28 05/09/2018 1407   GLUCOSE 96 05/09/2018 1407   GLUCOSE 141 (H) 03/28/2006 0843   BUN 14 05/09/2018 1407   CREATININE 0.81 05/09/2018 1407   CREATININE 1.12 (H) 03/15/2018 1206   CREATININE 0.85 09/27/2013 1224   CALCIUM 9.7 05/09/2018 1407   PROT 7.3 05/09/2018 1407   ALBUMIN 4.2 05/09/2018 1407   AST 51 (H) 05/09/2018 1407   AST 44 (H) 03/15/2018 1206   ALT 57 (H) 05/09/2018 1407   ALT 70 (H) 03/15/2018 1206   ALKPHOS 86 05/09/2018 1407   BILITOT 0.5 05/09/2018 1407   BILITOT 0.5 03/15/2018 1206   GFRNONAA >60 04/26/2018 0930   GFRNONAA 50 (L) 03/15/2018 1206   GFRAA >60 04/26/2018 0930   GFRAA 58 (L) 03/15/2018 1206    No results found for: TOTALPROTELP, ALBUMINELP, A1GS, A2GS, BETS, BETA2SER, GAMS, MSPIKE, SPEI  No results found for: KPAFRELGTCHN, LAMBDASER, KAPLAMBRATIO  Lab Results  Component Value Date   WBC 7.0 05/09/2018   NEUTROABS 3.6 03/15/2018   HGB 13.1 05/09/2018   HCT 40.2 05/09/2018   MCV 79.8 05/09/2018   PLT 304.0 05/09/2018    @LASTCHEMISTRY @  No results found for: LABCA2  No components found for: LPFXTK240  No results for input(s): INR in the last 168 hours.  No results found for: LABCA2  No results found for: XBD532  No results found for: DJM426  No results found for: STM196  No results found for: CA2729  No components found for: HGQUANT  No results found for: CEA1 / No results found for: CEA1   No results found  for: AFPTUMOR  No results found for: CHROMOGRNA  No results found for: PSA1  No visits with results within 3 Day(s) from this visit.  Latest known visit with results is:  Admission on 05/25/2018, Discharged on 05/25/2018  Component Date Value Ref Range Status  . Glucose-Capillary 05/25/2018 140* 70 - 99 mg/dL Final  . Glucose-Capillary 05/25/2018 116* 70 - 99 mg/dL Final    (this displays the last labs from the last 3 days)  No results found for: TOTALPROTELP, ALBUMINELP, A1GS, A2GS, BETS, BETA2SER, GAMS, MSPIKE, SPEI (this displays SPEP labs)  No results found for: KPAFRELGTCHN, LAMBDASER, KAPLAMBRATIO (kappa/lambda light chains)  No results found for: HGBA, HGBA2QUANT, HGBFQUANT, HGBSQUAN (Hemoglobinopathy evaluation)   No results found for: LDH  No results found for: IRON, TIBC, IRONPCTSAT (Iron and TIBC)  No results found for: FERRITIN  Urinalysis    Component Value Date/Time   COLORURINE YELLOW 07/18/2017 0908   APPEARANCEUR CLEAR 07/18/2017 0908   LABSPEC 1.020 07/18/2017 0908   PHURINE 5.5 07/18/2017 0908   GLUCOSEU >=500 (A) 07/18/2017 0908   HGBUR NEGATIVE 07/18/2017 0908   HGBUR negative 08/22/2009 0814   BILIRUBINUR NEGATIVE 07/18/2017 0908   BILIRUBINUR negative 07/27/2016 1535   KETONESUR NEGATIVE 07/18/2017 0908   PROTEINUR NEGATIVE 07/18/2017 0908   UROBILINOGEN 4.0 07/27/2016 1535   UROBILINOGEN 0.2 05/28/2013 1121   NITRITE NEGATIVE 07/18/2017 0908   LEUKOCYTESUR NEGATIVE 07/18/2017 0908     STUDIES: No results found.  ELIGIBLE FOR AVAILABLE RESEARCH PROTOCOL: no  ASSESSMENT: 68 y.o. [place], [place] woman status post right breast biopsy 03/07/2018 for ductal carcinoma in situ, grade 3, estrogen and progesterone receptor negative  (b) breast biopsy 03/23/2018 showed ductal carcinoma in situ, grade 1, estrogen and progesterone receptor positive  (1) status post  right lumpectomy 05/02/2018 for a 0.9 cm ductal carcinoma in situ, grade 2, with  focally positive margins  (a) left lumpectomy 05/02/2018 showed no residual disease  (b) additional surgery for margin clearance 05/25/2018 was successful  (2) adjuvant radiation pleaded 07/31/2018  (3) the patient has had genetics testing through her gynecologist.  Per patient no deleterious mutations found  (4) DEXA scan 02/28/2018 showed a T score of -1.7   PLAN: Anakaren has completed local treatment for her bilateral noninvasive breast cancers and in terms of the right-sided once she has completed all treatment since that tumor was estrogen receptor negative.  As far as these cancers are concerned, her risk of recurrence is low.  She understands recurrence could only be in the same breast and therefore if she has a headache or pain in a hip that is not going to be due to 1 of these cancers  She does have the option of taking antiestrogens prophylactically. We discussed the difference between tamoxifen and anastrozole in detail. She understands that anastrozole and the aromatase inhibitors in general work by blocking estrogen production. Accordingly vaginal dryness, decrease in bone density, and of course hot flashes can result. The aromatase inhibitors can also negatively affect the cholesterol profile, although that is a minor effect. One out of 5 women on aromatase inhibitors we will feel "old and achy". This arthralgia/myalgia syndrome, which resembles fibromyalgia clinically, does resolve with stopping the medications. Accordingly this is not a reason to not try an aromatase inhibitor but it is a frequent reason to stop it (in other words 20% of women will not be able to tolerate these medications).  Tamoxifen on the other hand does not block estrogen production. It does not "take away a woman's estrogen". It blocks the estrogen receptor in breast cells. Like anastrozole, it can also cause hot flashes. As opposed to anastrozole, tamoxifen has many estrogen-like effects. It is technically  an estrogen receptor modulator. This means that in some tissues tamoxifen works like estrogen-- for example it helps strengthen the bones. It tends to improve the cholesterol profile. It can cause thickening of the endometrial lining, and even endometrial polyps or rarely cancer of the uterus.(The risk of uterine cancer due to tamoxifen is one additional cancer per thousand women year). It can cause vaginal wetness or stickiness. It can cause blood clots through this estrogen-like effect--the risk of blood clots with tamoxifen is exactly the same as with birth control pills or hormone replacement.  Neither of these agents causes mood changes or weight gain, despite the popular belief that they can have these side effects. We have data from studies comparing either of these drugs with placebo, and in those cases the control group had the same amount of weight gain and depression as the group that took the drug.  After much discussion she tells me she really does not want to take an antiestrogen.  Her mother did take an antiestrogen and she had a recurrence anyway, with brain involvement.  She also does not like the idea of additional side effects  Accordingly the plan will be for observation alone.  She will have her next mammography in October.  She will see me again in November and I will see her on a yearly basis thereafter until she completes her 5 years of follow-up  She knows to call for any other issue that may develop before the next visit.     Milam Allbaugh, Virgie Dad, MD  08/14/18 3:15 PM Medical Oncology and  Hematology [facility] [place], [place] [address] Tel. [phone]    Fax. [phone]  I, Jacqualyn Posey am acting as a Education administrator for Chauncey Cruel, MD.   I, Lurline Del MD, have reviewed the above documentation for accuracy and completeness, and I agree with the above.

## 2018-08-13 NOTE — Assessment & Plan Note (Signed)
Has just finished her last radiation treatment and has a follow up appointment with oncology to confirm she does not need chemo. She has tolerated treatments well

## 2018-08-14 ENCOUNTER — Telehealth: Payer: Self-pay | Admitting: Oncology

## 2018-08-14 ENCOUNTER — Inpatient Hospital Stay: Payer: Medicare Other | Attending: Oncology | Admitting: Oncology

## 2018-08-14 VITALS — BP 137/76 | HR 74 | Temp 98.5°F | Resp 18 | Ht 63.0 in | Wt 184.7 lb

## 2018-08-14 DIAGNOSIS — Z7984 Long term (current) use of oral hypoglycemic drugs: Secondary | ICD-10-CM

## 2018-08-14 DIAGNOSIS — Z923 Personal history of irradiation: Secondary | ICD-10-CM

## 2018-08-14 DIAGNOSIS — K219 Gastro-esophageal reflux disease without esophagitis: Secondary | ICD-10-CM | POA: Diagnosis not present

## 2018-08-14 DIAGNOSIS — I10 Essential (primary) hypertension: Secondary | ICD-10-CM | POA: Insufficient documentation

## 2018-08-14 DIAGNOSIS — E119 Type 2 diabetes mellitus without complications: Secondary | ICD-10-CM | POA: Diagnosis not present

## 2018-08-14 DIAGNOSIS — E785 Hyperlipidemia, unspecified: Secondary | ICD-10-CM | POA: Diagnosis not present

## 2018-08-14 DIAGNOSIS — Z171 Estrogen receptor negative status [ER-]: Secondary | ICD-10-CM | POA: Diagnosis not present

## 2018-08-14 DIAGNOSIS — Z79899 Other long term (current) drug therapy: Secondary | ICD-10-CM | POA: Insufficient documentation

## 2018-08-14 DIAGNOSIS — M129 Arthropathy, unspecified: Secondary | ICD-10-CM | POA: Diagnosis not present

## 2018-08-14 DIAGNOSIS — D0512 Intraductal carcinoma in situ of left breast: Secondary | ICD-10-CM

## 2018-08-14 DIAGNOSIS — Z803 Family history of malignant neoplasm of breast: Secondary | ICD-10-CM | POA: Insufficient documentation

## 2018-08-14 DIAGNOSIS — Z86010 Personal history of colonic polyps: Secondary | ICD-10-CM | POA: Insufficient documentation

## 2018-08-14 DIAGNOSIS — D0511 Intraductal carcinoma in situ of right breast: Secondary | ICD-10-CM | POA: Diagnosis not present

## 2018-08-14 DIAGNOSIS — Z8041 Family history of malignant neoplasm of ovary: Secondary | ICD-10-CM | POA: Diagnosis not present

## 2018-08-14 NOTE — Telephone Encounter (Signed)
Printed out avs and calender per los 08/14/2018.

## 2018-08-15 DIAGNOSIS — D0510 Intraductal carcinoma in situ of unspecified breast: Secondary | ICD-10-CM | POA: Insufficient documentation

## 2018-08-21 ENCOUNTER — Telehealth: Payer: Self-pay

## 2018-08-21 NOTE — Telephone Encounter (Signed)
PA initiated via Covermymeds; KEY: [id number]. Awaiting determination.

## 2018-08-22 NOTE — Telephone Encounter (Signed)
PA approved.

## 2018-08-28 ENCOUNTER — Encounter: Payer: Self-pay | Admitting: *Deleted

## 2018-08-29 ENCOUNTER — Telehealth: Payer: Self-pay | Admitting: Radiation Oncology

## 2018-08-29 ENCOUNTER — Telehealth: Payer: Self-pay | Admitting: Oncology

## 2018-08-29 NOTE — Telephone Encounter (Signed)
Scheduled appt per 3/16 sch message - left message and sent reminder letter in the mail.

## 2018-08-29 NOTE — Telephone Encounter (Signed)
I called and LM asking pt to call me back as we need to cancel her appt Thursday due to concerns of coronavirus pandemic.

## 2018-08-31 ENCOUNTER — Ambulatory Visit: Payer: Self-pay | Admitting: Radiation Oncology

## 2018-08-31 NOTE — Progress Notes (Signed)
  Radiation Oncology         [phone]) [phone] ________________________________  Name: [name] MRN: [id number]  Date: 07/31/2018  DOB: 03/01/1951  End of Treatment Note  Diagnosis:   68 y.o. female with Intermediate grade ER/PR negative DCIS of the right breast, and Low grade ER/PR positive DCIS of the left breast  Indication for treatment:  Curative       Radiation treatment dates:   06/29/2018 - 07/31/2018  Site/dose:   The patient initially received a dose of 42.56 Gy in 16 fractions to the right breast using whole-breast tangent fields. This was delivered using a 3-D conformal technique. The patient then received a boost to the seroma. This delivered an additional 8 Gy in 4 fractions using a 3 field photon technique due to the depth of the seroma. The total dose was 50.56 Gy. At the same time, the patient received a dose of 42.56 Gy in 16 fractions to the left breast using whole-breast tangent fields. This was delivered using a 3-D conformal technique. The patient then received a boost to the seroma. This delivered an additional 8 Gy in 4 fractions using an en face electron field due to the depth of the seroma. The total dose was 50.56 Gy.  Narrative: The patient tolerated radiation treatment relatively well.   The patient had some expected skin irritation as she progressed during treatment. Hyperpigmentation with dry desquamation was noted in the axillae bilaterally and inframammary folds bilaterally. Moist desquamation was not present at the end of treatment. She is applying Radiaplex gel. She also noted moderate fatigue and some tenderness underneath her arms.  Plan: The patient has completed radiation treatment. The patient will return to radiation oncology clinic for routine followup in one month. I advised the patient to call or return sooner if they have any questions or concerns related to their recovery or treatment. ________________________________  Jodelle Gross, MD, PhD   This document serves as a record of services personally performed by Kyung Rudd, MD. It was created on his behalf by Rae Lips, a trained medical scribe. The creation of this record is based on the scribe's personal observations and the provider's statements to them. This document has been checked and approved by the attending provider.

## 2018-09-04 MED FILL — metFORMIN HCL 1000 MG TABS: 1000 | 30 days supply | Qty: 75 | Fill #1 | Status: TO

## 2018-09-05 MED FILL — INVOKANA 100 MG TABLET: 100 | 30 days supply | Qty: 30 | Fill #4 | Status: TO

## 2018-09-15 MED FILL — GLIMEPIRIDE 2 MG TABLET: 2 | 30 days supply | Qty: 120 | Fill #1 | Status: TO

## 2018-10-03 ENCOUNTER — Other Ambulatory Visit: Payer: Self-pay | Admitting: Family Medicine

## 2018-10-03 MED FILL — OMEPRAZOLE 40 MG CPDR: 40 | 90 days supply | Qty: 180 | Fill #0

## 2018-10-03 MED FILL — metFORMIN HCL 1000 MG TABS: 1000 | 30 days supply | Qty: 75 | Fill #0

## 2018-10-03 MED FILL — BISOPROLOL-HCTZ 5-6.25 MG T: 5-6.25 | 90 days supply | Qty: 90 | Fill #0

## 2018-10-23 MED FILL — GLIMEPIRIDE 2 MG TABLET: 2 | 30 days supply | Qty: 120 | Fill #0

## 2018-10-31 MED FILL — metFORMIN HCL 1000 MG TABS: 1000 | 30 days supply | Qty: 75 | Fill #1

## 2018-11-16 ENCOUNTER — Ambulatory Visit (INDEPENDENT_AMBULATORY_CARE_PROVIDER_SITE_OTHER): Payer: Medicare Other | Admitting: Family Medicine

## 2018-11-16 ENCOUNTER — Other Ambulatory Visit: Payer: Self-pay

## 2018-11-16 DIAGNOSIS — E1165 Type 2 diabetes mellitus with hyperglycemia: Secondary | ICD-10-CM

## 2018-11-16 DIAGNOSIS — E782 Mixed hyperlipidemia: Secondary | ICD-10-CM | POA: Diagnosis not present

## 2018-11-16 DIAGNOSIS — J302 Other seasonal allergic rhinitis: Secondary | ICD-10-CM | POA: Diagnosis not present

## 2018-11-16 DIAGNOSIS — I10 Essential (primary) hypertension: Secondary | ICD-10-CM

## 2018-11-16 MED ORDER — ALBUTEROL SULFATE HFA 108 (90 BASE) MCG/ACT IN AERS: 2.0000 | INHALATION_SPRAY | Freq: Four times a day (QID) | RESPIRATORY_TRACT | 0 refills | Status: DC | PRN

## 2018-11-16 MED ORDER — MONTELUKAST SODIUM 10 MG PO TABS: 10.0000 mg | ORAL_TABLET | Freq: Every evening | ORAL | 3 refills | Status: DC | PRN

## 2018-11-16 MED ORDER — FLUTICASONE PROPIONATE 50 MCG/ACT NA SUSP: 2.0000 | Freq: Every day | NASAL | 6 refills | Status: DC

## 2018-11-16 MED FILL — MONTELUKAST SOD 10 MG TAB: 10 | 30 days supply | Qty: 30 | Fill #0

## 2018-11-16 MED FILL — FLUTICASONE PROP 50 MCG SPR: 50 | 30 days supply | Qty: 16 | Fill #0

## 2018-11-16 MED FILL — ALBUTEROL SULFATE HFA 108 (: 108 (90 BAS | 25 days supply | Qty: 18 | Fill #0

## 2018-11-19 NOTE — Assessment & Plan Note (Signed)
no changes to meds. Encouraged heart healthy diet such as the DASH diet and exercise as tolerated.  

## 2018-11-19 NOTE — Progress Notes (Signed)
Virtual Visit via Video Note  I connected with [name] on 11/16/2018 at  1:40 PM EDT by a video enabled telemedicine application and verified that I am speaking with the correct person using two identifiers.  Location: Patient: home Provider: office   I discussed the limitations of evaluation and management by telemedicine and the availability of in person appointments. The patient expressed understanding and agreed to proceed. [name], CMA was able to get patient set up on video platform.     Subjective:    Patient ID: [name], female    DOB: 10/27/1950, 68 y.o.   MRN: [id number]  No chief complaint on file.   HPI Patient is in today for follow up on chronic medical concerns including diabetes, hypertension, hyperlipidemia, and more. She is doing well most days. No polyuria or polydipsia. She has had some trouble with congestion and shortness of breath at times this spring. Needs a refill on Albuterol to use prn. No recent febrile illness or hospitalizations recently. Denies CP/palp/HA/fevers/GI or GU c/o. Taking meds as prescribed  Past Medical History:  Diagnosis Date   Acute bronchitis 10/31/2015   Allergy    seasonal   Arthritis    knee-left   Cancer ([place]) 04/2018   right breast ca   Cataract    right eye   Diabetes mellitus 68   type 2   Diaphragmatic hernia without mention of obstruction or gangrene    Endometriosis    Esophageal reflux    Esophageal stricture    GERD (gastroesophageal reflux disease)    Hyperlipidemia    Hyperplastic colon polyp    Hypertension 52   Nonalcoholic fatty liver disease 04/28/2010   Qualifier: Diagnosis of  By: Arnoldo Morale MD, John E    Nonspecific abnormal results of liver function study    Pharyngitis 02/18/2012   Plantar fascial fibromatosis    Preventative health care 01/22/2013   Seasonal allergies 10/31/2015   Sleep apnea    has oral appliance but does not fit well due to missing teeth    Sore throat 04/03/2017   Tubular adenoma of colon    Unspecified sleep apnea    no c-pap    Past Surgical History:  Procedure Laterality Date   ABDOMINAL HYSTERECTOMY  1982   partial   APPENDECTOMY     benigh cyst in lung  2006   right   BREAST CYST INCISION AND DRAINAGE     right breast   BREAST LUMPECTOMY WITH RADIOACTIVE SEED LOCALIZATION Bilateral 05/02/2018   Procedure: RIGHT BREAST SEED LUMPECTOMY X2 AND LEFT BREAST SEED GUIDED LUMPECTOMY;  Surgeon: [name], MD;  Location: [facility];  Service: General;  Laterality: Bilateral;   CATARACT EXTRACTION     right eye   HERNIA REPAIR     per pt, she is not aware of this surgery   RE-EXCISION OF BREAST LUMPECTOMY Right 05/25/2018   Procedure: RE-EXCISION OF RIGHT BREAST LUMPECTOMY;  Surgeon: [name], MD;  Location: [facility];  Service: General;  Laterality: Right;   seba     TUBAL LIGATION      Family History  Problem Relation Age of Onset   Breast cancer Mother    Hypertension Mother    Diabetes Mother        type 2   Other Mother        esophagus surgery   Heart disease Father    Hyperlipidemia Father    Hypertension Father    Heart  attack Father        MI at age 74   Diabetes Brother        type 2   Heart disease Maternal Grandmother    Heart attack Maternal Grandfather    Hypertension Maternal Grandfather    Stroke Paternal Grandmother    Hypertension Sister    Diabetes Sister    Hypertension Brother    Alcohol abuse Maternal Aunt    Cancer Maternal Aunt    Colon cancer Neg Hx    Stomach cancer Neg Hx    Esophageal cancer Neg Hx     Social History   Socioeconomic History   Marital status: Married    Spouse name: English as a second language teacher   Number of children: 2   Years of education: Not on file   Highest education level: Not on file  Occupational History   Occupation: Building surveyor: [employer]  Social Film/video editor strain: Not on file   Food insecurity:    Worry: Not on file    Inability: Not on file   Transportation needs:    Medical: No    Non-medical: No  Tobacco Use   Smoking status: Never Smoker   Smokeless tobacco: Never Used  Substance and Sexual Activity   Alcohol use: No    Alcohol/week: 0.0 standard drinks   Drug use: No   Sexual activity: Yes    Birth control/protection: Post-menopausal    Comment: lives with husband, no dietary restrictions just watching carbs, wears seat belt  Lifestyle   Physical activity:    Days per week: Not on file    Minutes per session: Not on file   Stress: Not on file  Relationships   Social connections:    Talks on phone: Not on file    Gets together: Not on file    Attends religious service: Not on file    Active member of club or organization: Not on file    Attends meetings of clubs or organizations: Not on file    Relationship status: Not on file   Intimate partner violence:    Fear of current or ex partner: No    Emotionally abused: No    Physically abused: No    Forced sexual activity: No  Other Topics Concern   Not on file  Social History Narrative   Not on file    Outpatient Medications Prior to Visit  Medication Sig Dispense Refill   ASPIR-LOW 81 MG EC tablet TAKE 1 TABLET BY MOUTH DAILY 30 tablet 3   bisoprolol-hydrochlorothiazide (ZIAC) 5-6.25 MG tablet TAKE 1 TABLET BY MOUTH DAILY. 90 tablet 0   canagliflozin (INVOKANA) 100 MG TABS tablet Take 1 tablet (100 mg total) by mouth daily before breakfast. Patient stopped in January due to cost     glimepiride (AMARYL) 2 MG tablet Take 1-2 tablets (2-4 mg total) by mouth 2 (two) times daily. 120 tablet 3   glucose blood (CONTOUR NEXT TEST) test strip USE TO CHECK BLOOD SUGAR DAILY AS NEEDED DX:E11.9 100 each 5   metFORMIN (GLUCOPHAGE) 1000 MG tablet TAKE 1 TABLET BY MOUTH TWICE A DAY AND TAKE 1/2 TABLET AT NOON 75 tablet 1   omeprazole (PRILOSEC) 40 MG  capsule Take 1 capsule (40 mg total) by mouth 2 (two) times daily. 180 capsule 3   Facility-Administered Medications Prior to Visit  Medication Dose Route Frequency Provider Last Rate Last Dose   Chlorhexidine Gluconate Cloth 2 % PADS 6  each  6 each Topical Once [name], MD       And   Chlorhexidine Gluconate Cloth 2 % PADS 6 each  6 each Topical Once [name], MD        Allergies  Allergen Reactions   Pravastatin Other (See Comments)    Myalgia, fatigue, memory loss   Amoxicillin Itching    REACTION: rash   Atorvastatin Other (See Comments)    Myalgia, fatigue, memory loss   Codeine Nausea And Vomiting    Review of Systems  Constitutional: Negative for fever and malaise/fatigue.  HENT: Positive for congestion.   Eyes: Negative for blurred vision.  Respiratory: Positive for shortness of breath.   Cardiovascular: Negative for chest pain, palpitations and leg swelling.  Gastrointestinal: Negative for abdominal pain, blood in stool and nausea.  Genitourinary: Negative for dysuria and frequency.  Musculoskeletal: Negative for falls.  Skin: Negative for rash.  Neurological: Negative for dizziness, loss of consciousness and headaches.  Endo/Heme/Allergies: Negative for environmental allergies.  Psychiatric/Behavioral: Negative for depression. The patient is not nervous/anxious.        Objective:    Physical Exam Constitutional:      Appearance: Normal appearance. She is not ill-appearing.  HENT:     Head: Normocephalic and atraumatic.  Eyes:     General:        Right eye: No discharge.        Left eye: No discharge.  Pulmonary:     Effort: Pulmonary effort is normal.  Neurological:     Mental Status: She is alert and oriented to person, place, and time.  Psychiatric:        Mood and Affect: Mood normal.        Behavior: Behavior normal.     There were no vitals taken for this visit. Wt Readings from Last 3 Encounters:  08/14/18 184 lb 11.2 oz  (83.8 kg)  08/10/18 188 lb 12.8 oz (85.6 kg)  06/22/18 190 lb (86.2 kg)    Diabetic Foot Exam - Simple   No data filed     Lab Results  Component Value Date   WBC 7.0 05/09/2018   HGB 13.1 05/09/2018   HCT 40.2 05/09/2018   PLT 304.0 05/09/2018   GLUCOSE 96 05/09/2018   CHOL 227 (H) 05/09/2018   TRIG 190.0 (H) 05/09/2018   HDL 37.50 (L) 05/09/2018   LDLCALC 152 (H) 05/09/2018   ALT 57 (H) 05/09/2018   AST 51 (H) 05/09/2018   NA 138 05/09/2018   K 4.1 05/09/2018   CL 99 05/09/2018   CREATININE 0.81 05/09/2018   BUN 14 05/09/2018   CO2 28 05/09/2018   TSH 0.97 05/09/2018   HGBA1C 7.8 (H) 05/09/2018   MICROALBUR <0.7 12/04/2015    Lab Results  Component Value Date   TSH 0.97 05/09/2018   Lab Results  Component Value Date   WBC 7.0 05/09/2018   HGB 13.1 05/09/2018   HCT 40.2 05/09/2018   MCV 79.8 05/09/2018   PLT 304.0 05/09/2018   Lab Results  Component Value Date   NA 138 05/09/2018   K 4.1 05/09/2018   CO2 28 05/09/2018   GLUCOSE 96 05/09/2018   BUN 14 05/09/2018   CREATININE 0.81 05/09/2018   BILITOT 0.5 05/09/2018   ALKPHOS 86 05/09/2018   AST 51 (H) 05/09/2018   ALT 57 (H) 05/09/2018   PROT 7.3 05/09/2018   ALBUMIN 4.2 05/09/2018   CALCIUM 9.7 05/09/2018   ANIONGAP 11 04/26/2018  GFR 91.07 05/09/2018   Lab Results  Component Value Date   CHOL 227 (H) 05/09/2018   Lab Results  Component Value Date   HDL 37.50 (L) 05/09/2018   Lab Results  Component Value Date   LDLCALC 152 (H) 05/09/2018   Lab Results  Component Value Date   TRIG 190.0 (H) 05/09/2018   Lab Results  Component Value Date   CHOLHDL 6 05/09/2018   Lab Results  Component Value Date   HGBA1C 7.8 (H) 05/09/2018       Assessment & Plan:   Problem List Items Addressed This Visit    Type 2 diabetes mellitus with hyperglycemia, without long-term current use of insulin (HCC)    hgba1c acceptable, minimize simple carbs. Increase exercise as tolerated. Continue current  meds      Relevant Orders   Hemoglobin A1c   TSH   Hyperlipidemia, mixed    Encouraged heart healthy diet, increase exercise, avoid trans fats, consider a krill oil cap daily      Relevant Orders   Lipid panel   Essential hypertension - Primary     no changes to meds. Encouraged heart healthy diet such as the DASH diet and exercise as tolerated.       Relevant Orders   CBC   Comprehensive metabolic panel   TSH   Seasonal allergies    Has been managing her allergies with Flonase but still having some episodes of congestion and sob. Given a prescription for Singlair to add as needed. Consider allergic asthma and given a refifll on Albuterol to use prn          I am having Jonnelle Dowda start on albuterol, fluticasone, and montelukast. I am also having her maintain her Aspir-Low, omeprazole, glucose blood, glimepiride, canagliflozin, bisoprolol-hydrochlorothiazide, and metFORMIN.  Meds ordered this encounter  Medications   albuterol (VENTOLIN HFA) 108 (90 Base) MCG/ACT inhaler    Sig: Inhale 2 puffs into the lungs every 6 (six) hours as needed for wheezing or shortness of breath.    Dispense:  1 Inhaler    Refill:  0   fluticasone (FLONASE) 50 MCG/ACT nasal spray    Sig: Place 2 sprays into both nostrils daily.    Dispense:  16 g    Refill:  6   montelukast (SINGULAIR) 10 MG tablet    Sig: Take 1 tablet (10 mg total) by mouth at bedtime as needed.    Dispense:  30 tablet    Refill:  3      I discussed the assessment and treatment plan with the patient. The patient was provided an opportunity to ask questions and all were answered. The patient agreed with the plan and demonstrated an understanding of the instructions.   The patient was advised to call back or seek an in-person evaluation if the symptoms worsen or if the condition fails to improve as anticipated.  I provided 25 minutes of non-face-to-face time during this encounter.   [name], MD

## 2018-11-19 NOTE — Assessment & Plan Note (Signed)
hgba1c acceptable, minimize simple carbs. Increase exercise as tolerated. Continue current meds 

## 2018-11-19 NOTE — Assessment & Plan Note (Signed)
Encouraged heart healthy diet, increase exercise, avoid trans fats, consider a krill oil cap daily 

## 2018-11-19 NOTE — Assessment & Plan Note (Addendum)
Has been managing her allergies with Flonase but still having some episodes of congestion and sob. Given a prescription for Singlair to add as needed. Consider allergic asthma and given a refifll on Albuterol to use prn

## 2018-11-29 ENCOUNTER — Other Ambulatory Visit (INDEPENDENT_AMBULATORY_CARE_PROVIDER_SITE_OTHER): Payer: Medicare Other

## 2018-11-29 ENCOUNTER — Other Ambulatory Visit: Payer: Self-pay

## 2018-11-29 DIAGNOSIS — E782 Mixed hyperlipidemia: Secondary | ICD-10-CM

## 2018-11-29 DIAGNOSIS — I10 Essential (primary) hypertension: Secondary | ICD-10-CM | POA: Diagnosis not present

## 2018-11-29 DIAGNOSIS — E1165 Type 2 diabetes mellitus with hyperglycemia: Secondary | ICD-10-CM

## 2018-11-29 LAB — LIPID PANEL
Cholesterol: 219 mg/dL — ABNORMAL HIGH (ref 0–200)
HDL: 35.4 mg/dL — ABNORMAL LOW (ref >39.00)
NonHDL: 183.2
Total CHOL/HDL Ratio: 6
Triglycerides: 259 mg/dL — ABNORMAL HIGH (ref 0.0–149.0)
VLDL: 51.8 mg/dL — ABNORMAL HIGH (ref 0.0–40.0)

## 2018-11-29 LAB — COMPREHENSIVE METABOLIC PANEL
ALT: 40 U/L — ABNORMAL HIGH (ref 0–35)
AST: 25 U/L (ref 0–37)
Albumin: 4.2 g/dL (ref 3.5–5.2)
Alkaline Phosphatase: 71 U/L (ref 39–117)
BUN: 19 mg/dL (ref 6–23)
CO2: 25 mEq/L (ref 19–32)
Calcium: 9.7 mg/dL (ref 8.4–10.5)
Chloride: 100 mEq/L (ref 96–112)
Creatinine, Ser: 0.92 mg/dL (ref 0.40–1.20)
GFR: 73.85 mL/min (ref >60.00)
Glucose, Bld: 116 mg/dL — ABNORMAL HIGH (ref 70–99)
Potassium: 3.9 mEq/L (ref 3.5–5.1)
Sodium: 138 mEq/L (ref 135–145)
Total Bilirubin: 0.4 mg/dL (ref 0.2–1.2)
Total Protein: 7.2 g/dL (ref 6.0–8.3)

## 2018-11-29 LAB — CBC
HCT: 37.9 % (ref 36.0–46.0)
Hemoglobin: 12.6 g/dL (ref 12.0–15.0)
MCHC: 33.2 g/dL (ref 30.0–36.0)
MCV: 82 fl (ref 78.0–100.0)
Platelets: 236 10*3/uL (ref 150.0–400.0)
RBC: 4.62 Mil/uL (ref 3.87–5.11)
RDW: 13.7 % (ref 11.5–15.5)
WBC: 5.5 10*3/uL (ref 4.0–10.5)

## 2018-11-29 LAB — HEMOGLOBIN A1C: Hgb A1c MFr Bld: 8 % — ABNORMAL HIGH (ref 4.6–6.5)

## 2018-11-29 LAB — LDL CHOLESTEROL, DIRECT: Direct LDL: 146 mg/dL

## 2018-11-29 LAB — TSH: TSH: 1.19 u[IU]/mL (ref 0.35–4.50)

## 2018-12-01 ENCOUNTER — Telehealth: Payer: Self-pay | Admitting: Adult Health

## 2018-12-01 ENCOUNTER — Other Ambulatory Visit: Payer: Self-pay | Admitting: Family Medicine

## 2018-12-01 MED FILL — metFORMIN HCL 1000 MG TABS: 1000 | 30 days supply | Qty: 75 | Fill #0

## 2018-12-01 MED FILL — GLIMEPIRIDE 2 MG TABLET: 2 | 30 days supply | Qty: 120 | Fill #1

## 2018-12-01 NOTE — Telephone Encounter (Signed)
I talk with patient regarding visit  °

## 2018-12-08 MED ORDER — ROSUVASTATIN CALCIUM 5 MG PO TABS: 5.0000 mg | ORAL_TABLET | Freq: Every day | ORAL | 3 refills | Status: DC

## 2018-12-08 MED FILL — ROSUVASTATIN CALCIUM 5 MG T: 5 | 90 days supply | Qty: 90 | Fill #0

## 2018-12-08 NOTE — Addendum Note (Signed)
Addended by: [name] A on: 12/08/2018 08:52 AM   Modules accepted: Orders

## 2018-12-18 ENCOUNTER — Telehealth: Payer: Self-pay | Admitting: Adult Health

## 2018-12-18 NOTE — Telephone Encounter (Signed)
Confirmed Appt/Verified Info

## 2018-12-19 ENCOUNTER — Encounter: Payer: Self-pay | Admitting: Adult Health

## 2018-12-19 ENCOUNTER — Telehealth: Payer: Self-pay | Admitting: Adult Health

## 2018-12-19 ENCOUNTER — Inpatient Hospital Stay: Payer: Medicare Other | Attending: Adult Health | Admitting: Adult Health

## 2018-12-19 DIAGNOSIS — D0512 Intraductal carcinoma in situ of left breast: Secondary | ICD-10-CM | POA: Diagnosis not present

## 2018-12-19 DIAGNOSIS — D0511 Intraductal carcinoma in situ of right breast: Secondary | ICD-10-CM

## 2018-12-19 NOTE — Progress Notes (Addendum)
SURVIVORSHIP VIRTUAL VISIT:  I connected with [name] on 12/19/18 at  2:00 PM EDT by my chart video and verified that I am speaking with the correct person using two identifiers.   I discussed the limitations, risks, security and privacy concerns of performing an evaluation and management service by telephone/video and the availability of in person appointments. I also discussed with the patient that there may be a patient responsible charge related to this service. The patient expressed understanding and agreed to proceed.   BRIEF ONCOLOGIC HISTORY:  Oncology History  Ductal carcinoma in situ (DCIS) of right breast  03/10/2018 Initial Diagnosis   Ductal carcinoma in situ (Dstatus post right breast biopsy 03/07/2018 for ductal carcinoma in situ, grade 3, estrogen and progesterone receptor negative             (b)Left breast biopsy 03/23/2018 showed ductal carcinoma in situ, grade 1, estrogen and progesterone receptor positive         05/02/2018 Surgery    status post right lumpectomy for a 0.9 cm ductal carcinoma in situ, grade 2, with focally positive margins             (a) left lumpectomy 05/02/2018 showed no residual disease             (b) additional surgery for margin clearance 05/25/2018 was successful     06/29/2018 - 07/31/2018 Radiation Therapy   The patient initially received a dose of 42.56 Gy in 16 fractions to the right breast using whole-breast tangent fields. This was delivered using a 3-D conformal technique. The patient then received a boost to the seroma. This delivered an additional 8 Gy in 4 fractions using a 3 field photon technique due to the depth of the seroma. The total dose was 50.56 Gy. At the same time, the patient received a dose of 42.56 Gy in 16 fractions to the left breast using whole-breast tangent fields. This was delivered using a 3-D conformal technique. The patient then received a boost to the seroma. This delivered an additional 8 Gy in 4 fractions  using an en face electron field due to the depth of the seroma. The total dose was 50.56 Gy.    Genetic Testing   The [facility] gene panel offered by [employer] includes sequencing and deletion/duplication testing of the following 35 genes: APC, ATM, AXIN2, BARD1, BMPR1A, BRCA1, BRCA2, BRIP1, CHD1, CDK4, CDKN2A, CHEK2, EPCAM (large rearrangement only), HOXB13, (sequencing only), GALNT12, MLH1, MSH2, MSH3 (excluding repetitive portions of exon 1), MSH6, MUTYH, NBN, NTHL1, PALB2, PMS2, PTEN, RAD51C, RAD51D, RNF43, RPS20, SMAD4, STK11, and TP53. Sequencing was performed for select regions of POLE and POLD1, and large rearrangement analysis was performed for select regions of GREM1.    08/2018 -  Anti-estrogen oral therapy   Declined.  DEXA scan 02/28/2018 showed a T score of -1.7     INTERVAL HISTORY:  [name] to review her survivorship care plan detailing her treatment course for breast cancer, as well as monitoring long-term side effects of that treatment, education regarding health maintenance, screening, and overall wellness and health promotion.     Overall, [name] reports feeling quite well.  [name] notes that she has a knot in her breast that needs to be evaluated. She thinks that it is related to a keloid.  She notes that it is directly below the lumpectomy site.    [name] is retired.  She is not exercising as much.  She notes she has gained some weight.  REVIEW OF SYSTEMS:  Review of Systems  Constitutional: Negative for appetite change, chills, fatigue and unexpected weight change.  HENT:   Negative for hearing loss and lump/mass.   Eyes: Negative for eye problems and icterus.  Respiratory: Negative for chest tightness, cough and shortness of breath.   Cardiovascular: Negative for chest pain, leg swelling and palpitations.  Gastrointestinal: Negative for abdominal distention, abdominal pain, constipation, diarrhea, nausea, rectal pain and vomiting.   Endocrine: Negative for hot flashes.  Musculoskeletal: Negative for arthralgias.  Skin: Negative for itching and rash.  Neurological: Negative for dizziness, extremity weakness, headaches and numbness.  Hematological: Negative for adenopathy. Does not bruise/bleed easily.  Psychiatric/Behavioral: Negative for depression. The patient is not nervous/anxious.        ONCOLOGY TREATMENT TEAM:  1. Surgeon:  Dr. Barry Dienes at [facility] 2. Medical Oncologist: Dr. Jana Hakim    PAST MEDICAL/SURGICAL HISTORY:  Past Medical History:  Diagnosis Date  . Acute bronchitis 10/31/2015  . Allergy    seasonal  . Arthritis    knee-left  . Cancer ([place]) 04/2018   right breast ca  . Cataract    right eye  . Diabetes mellitus 52   type 2  . Diaphragmatic hernia without mention of obstruction or gangrene   . Endometriosis   . Esophageal reflux   . Esophageal stricture   . GERD (gastroesophageal reflux disease)   . Hyperlipidemia   . Hyperplastic colon polyp   . Hypertension 52  . Nonalcoholic fatty liver disease 04/28/2010   Qualifier: Diagnosis of  By: Arnoldo Morale MD, Balinda Quails   . Nonspecific abnormal results of liver function study   . Pharyngitis 02/18/2012  . Plantar fascial fibromatosis   . Preventative health care 01/22/2013  . Seasonal allergies 10/31/2015  . Sleep apnea    has oral appliance but does not fit well due to missing teeth  . Sore throat 04/03/2017  . Tubular adenoma of colon   . Unspecified sleep apnea    no c-pap   Past Surgical History:  Procedure Laterality Date  . ABDOMINAL HYSTERECTOMY  1982   partial  . APPENDECTOMY    . 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 BREAST CYST INCISION AND DRAINAGE     right breast  . BREAST LUMPECTOMY WITH RADIOACTIVE SEED LOCALIZATION Bilateral 05/02/2018   Procedure: RIGHT BREAST SEED LUMPECTOMY X2 AND LEFT BREAST SEED GUIDED LUMPECTOMY;  Surgeon: [name], MD;  Location: [place];  Service: General;   Laterality: Bilateral;  . CATARACT EXTRACTION     right eye  . HERNIA REPAIR     per pt, she is not aware of this surgery  . RE-EXCISION OF BREAST LUMPECTOMY Right 05/25/2018   Procedure: RE-EXCISION OF RIGHT BREAST LUMPECTOMY;  Surgeon: [name], MD;  Location: [place];  Service: General;  Laterality: Right;  . seba    . TUBAL LIGATION       ALLERGIES:  Allergies  Allergen Reactions  . Pravastatin Other (See Comments)    Myalgia, fatigue, memory loss  . Amoxicillin Itching    REACTION: rash  . Atorvastatin Other (See Comments)    Myalgia, fatigue, memory loss  . Codeine Nausea And Vomiting     CURRENT MEDICATIONS:  Outpatient Encounter Medications as of 12/19/2018  Medication Sig  . albuterol (VENTOLIN HFA) 108 (90 Base) MCG/ACT inhaler Inhale 2 puffs into the lungs every 6 (six) hours as needed for wheezing or shortness of breath.  Marland Kitchen  ASPIR-LOW 81 MG EC tablet TAKE 1 TABLET BY MOUTH DAILY  . bisoprolol-hydrochlorothiazide (ZIAC) 5-6.25 MG tablet TAKE 1 TABLET BY MOUTH DAILY.  . canagliflozin (INVOKANA) 100 MG TABS tablet Take 1 tablet (100 mg total) by mouth daily before breakfast. Patient stopped in January due to cost  . fluticasone (FLONASE) 50 MCG/ACT nasal spray Place 2 sprays into both nostrils daily.  Marland Kitchen glimepiride (AMARYL) 2 MG tablet Take 1-2 tablets (2-4 mg total) by mouth 2 (two) times daily.  Marland Kitchen glucose blood (CONTOUR NEXT TEST) test strip USE TO CHECK BLOOD SUGAR DAILY AS NEEDED DX:E11.9  . metFORMIN (GLUCOPHAGE) 1000 MG tablet TAKE 1 TABLET BY MOUTH TWICE A DAY AND TAKE 1/2 TABLET AT NOON  . montelukast (SINGULAIR) 10 MG tablet Take 1 tablet (10 mg total) by mouth at bedtime as needed.  Marland Kitchen omeprazole (PRILOSEC) 40 MG capsule Take 1 capsule (40 mg total) by mouth 2 (two) times daily.  . rosuvastatin (CRESTOR) 5 MG tablet Take 1 tablet (5 mg total) by mouth daily.   Facility-Administered Encounter Medications as of 12/19/2018  Medication  .  Chlorhexidine Gluconate Cloth 2 % PADS 6 each   And  . Chlorhexidine Gluconate Cloth 2 % PADS 6 each     ONCOLOGIC FAMILY HISTORY:  Family History  Problem Relation Age of Onset  . Breast cancer Mother   . Hypertension Mother   . Diabetes Mother        type 2  . Other Mother        esophagus surgery  . Heart disease Father   . Hyperlipidemia Father   . Hypertension Father   . Heart attack Father        MI at age 61  . Diabetes Brother        type 2  . Heart disease Maternal Grandmother   . Heart attack Maternal Grandfather   . Hypertension Maternal Grandfather   . Stroke Paternal Grandmother   . Hypertension Sister   . Diabetes Sister   . Hypertension Brother   . Alcohol abuse Maternal Aunt   . Cancer Maternal Aunt   . Colon cancer Neg Hx   . Stomach cancer Neg Hx   . Esophageal cancer Neg Hx      GENETIC COUNSELING/TESTING: Negative  SOCIAL HISTORY:  Social History   Socioeconomic History  . Marital status: Married    Spouse name: [name]  . Number of children: 2  . Years of education: Not on file  . Highest education level: Not on file  Occupational History  . Occupation: Building surveyor: [employer]  . Financial resource strain: Not on file  . Food insecurity    Worry: Not on file    Inability: Not on file  . Transportation needs    Medical: No    Non-medical: No  Tobacco Use  . Smoking status: Never Smoker  . Smokeless tobacco: Never Used  Substance and Sexual Activity  . Alcohol use: No    Alcohol/week: 0.0 standard drinks  . Drug use: No  . Sexual activity: Yes    Birth control/protection: Post-menopausal    Comment: lives with husband, no dietary restrictions just watching carbs, wears seat belt  Lifestyle  . Physical activity    Days per week: Not on file    Minutes per session: Not on file  . Stress: Not on file  Relationships  . Social connections    Talks on phone: Not on file  Gets together: Not on  file    Attends religious service: Not on file    Active member of club or organization: Not on file    Attends meetings of clubs or organizations: Not on file    Relationship status: Not on file  . Intimate partner violence    Fear of current or ex partner: No    Emotionally abused: No    Physically abused: No    Forced sexual activity: No  Other Topics Concern  . Not on file  Social History Narrative  . Not on file     OBSERVATIONS/OBJECTIVE:  Patient sounds well.  In no apparent distress.  Mood and behavior are normal.  Breathing is non labored.    LABORATORY DATA:  None for this visit.  DIAGNOSTIC IMAGING:  None for this visit.      ASSESSMENT AND PLAN:  [name] is a pleasant 68 y.o. female with Stage 0 bilateral breast DCIS, ER+/PR+, diagnosed in 03/2018, treated with lumpectomies and adjuvant radiation.  She declined anti estrogen therapy.  She presents to the Survivorship Clinic for our initial meeting and routine follow-up post-completion of treatment for breast cancer.    1. Stage 0 bilateral breast cancer:  [name] is continuing to recover from definitive treatment for breast cancer.  She has a concern of her breast having a lump, so she will come in on Thursday for me to evaluate.  Once I evaluate, we will decide whether or not to do mammogram.    Today, a comprehensive survivorship care plan and treatment summary was reviewed with the patient today detailing her breast cancer diagnosis, treatment course, potential late/long-term effects of treatment, appropriate follow-up care with recommendations for the future, and patient education resources.  A copy of this summary, along with a letter will be sent to the patient's primary care provider via mail/fax/In Basket message after today's visit.    2. Bone health:  Given [name]'s age/history of breast cancer, she is at slight risk for bone demineralization.  Her last DEXA scan was 02/2018, which showed  osteopenia.  She will be due for repeat in 02/2020.  In the meantime, she was encouraged to increase her consumption of foods rich in calcium, as well as increase her weight-bearing activities.  She was given education on specific activities to promote bone health.  3. Cancer screening:  Due to [name]'s history and her age, she should receive screening for skin cancers, colon cancer, and gynecologic cancers.  The information and recommendations are listed on the patient's comprehensive care plan/treatment summary and were reviewed in detail with the patient.    4. Health maintenance and wellness promotion: [name] was encouraged to consume 5-7 servings of fruits and vegetables per day. We reviewed the "Nutrition Rainbow" handout, as well as the handout "Take Control of Your Health and Reduce Your Cancer Risk" from the [place].  She was also encouraged to engage in moderate to vigorous exercise for 30 minutes per day most days of the week. We discussed the [employer] fitness program, which is designed for cancer survivors to help them become more physically fit after cancer treatments.  She was instructed to limit her alcohol consumption and continue to abstain from tobacco use.     5. Support services/counseling: It is not uncommon for this period of the patient's cancer care trajectory to be one of many emotions and stressors.  We discussed how this can be increasingly difficult during the times  of quarantine and social distancing due to the COVID-19 pandemic.   She was given information regarding our available services and encouraged to contact me with any questions or for help enrolling in any of our support group/programs.    Follow up instructions:    -Return to cancer center Thursday; f/u with Dr. Jana Hakim in 04/2019 -Mammogram due in 02/2019  -She is welcome to return back to the Survivorship Clinic at any time; no additional follow-up needed at this time.  -Consider  referral back to survivorship as a long-term survivor for continued surveillance  The patient was provided an opportunity to ask questions and all were answered. The patient agreed with the plan and demonstrated an understanding of the instructions.   The patient was advised to call back or seek an in-person evaluation if the symptoms worsen or if the condition fails to improve as anticipated.   I provided 30 minutes of face-to-face video visit time during this encounter, and > 50% was spent counseling as documented under my assessment & plan.  Scot Dock, NP   Addendum:  Vitals today: Weight 193.7 pounds, BP 130/74, HR 78, R 18, 98.9, 100%  Due to breast changes that were new and present for three weeks in the right breast [name] came in on 12/21/2018 at 1130 for evaluation.  I examined her breast and noted thickening and a mass in the right upper breast above her lumpectomy incision site at 12 oclock.  I recommend that she go ahead and undergo mammogram and ultrasound of the right breast.  I placed those orders and we will fax them over to [facility] and get this scheduled.  I had also initially thought her bone density was in 02/2018, it appears the wrong patient information was scanned into her chart.  I apologized for the error.  She will f/u with her gyn about bone density testing.    Wilber Bihari, NP

## 2018-12-19 NOTE — Telephone Encounter (Signed)
Per Mendel Ryder email patient doesn't need to pay co-pay for appointment

## 2018-12-21 ENCOUNTER — Ambulatory Visit: Payer: Medicare Other | Admitting: Adult Health

## 2018-12-21 ENCOUNTER — Inpatient Hospital Stay: Payer: Medicare Other | Admitting: Adult Health

## 2018-12-21 ENCOUNTER — Other Ambulatory Visit: Payer: Self-pay

## 2018-12-21 DIAGNOSIS — D0512 Intraductal carcinoma in situ of left breast: Secondary | ICD-10-CM

## 2018-12-21 DIAGNOSIS — D0511 Intraductal carcinoma in situ of right breast: Secondary | ICD-10-CM

## 2018-12-21 NOTE — Progress Notes (Signed)
See addendum from our visit from 12/19/2018.  That will detail today's visit.

## 2018-12-21 NOTE — Addendum Note (Signed)
Addended by: Wilber Bihari C on: 12/21/2018 11:59 AM   Modules accepted: Orders

## 2018-12-29 DIAGNOSIS — E119 Type 2 diabetes mellitus without complications: Secondary | ICD-10-CM | POA: Diagnosis not present

## 2018-12-29 LAB — HM DIABETES EYE EXAM

## 2019-01-02 ENCOUNTER — Other Ambulatory Visit: Payer: Self-pay | Admitting: Family Medicine

## 2019-01-02 MED FILL — OMEPRAZOLE 40 MG CPDR: 40 | 90 days supply | Qty: 180 | Fill #1

## 2019-01-02 MED FILL — GLIMEPIRIDE 2 MG TABLET: 2 | 30 days supply | Qty: 120 | Fill #0

## 2019-01-02 MED FILL — metFORMIN HCL 1000 MG TABS: 1000 | 30 days supply | Qty: 75 | Fill #1

## 2019-01-04 ENCOUNTER — Encounter: Payer: Self-pay | Admitting: Family Medicine

## 2019-01-08 ENCOUNTER — Ambulatory Visit
Admission: RE | Admit: 2019-01-08 | Discharge: 2019-01-08 | Disposition: A | Discharge status: Home / Self Care | Payer: Medicare Other | Source: Ambulatory Visit | Attending: Adult Health | Admitting: Adult Health

## 2019-01-08 ENCOUNTER — Other Ambulatory Visit: Payer: Self-pay

## 2019-01-08 DIAGNOSIS — D0512 Intraductal carcinoma in situ of left breast: Secondary | ICD-10-CM

## 2019-01-08 DIAGNOSIS — D0511 Intraductal carcinoma in situ of right breast: Secondary | ICD-10-CM

## 2019-01-08 DIAGNOSIS — N6489 Other specified disorders of breast: Secondary | ICD-10-CM | POA: Diagnosis not present

## 2019-01-08 DIAGNOSIS — R922 Inconclusive mammogram: Secondary | ICD-10-CM | POA: Diagnosis not present

## 2019-01-15 ENCOUNTER — Other Ambulatory Visit: Payer: Self-pay | Admitting: Family Medicine

## 2019-01-16 MED FILL — BISOPROLOL-HCTZ 5-6.25 MG T: 5-6.25 | 90 days supply | Qty: 90 | Fill #0

## 2019-02-05 ENCOUNTER — Other Ambulatory Visit: Payer: Self-pay | Admitting: Family Medicine

## 2019-02-05 MED FILL — GLIMEPIRIDE 2 MG TABLET: 2 | 30 days supply | Qty: 120 | Fill #1

## 2019-02-05 MED FILL — metFORMIN HCL 1000 MG TABS: 1000 | 30 days supply | Qty: 75 | Fill #0

## 2019-02-16 ENCOUNTER — Encounter: Payer: Self-pay | Admitting: Family Medicine

## 2019-02-16 ENCOUNTER — Other Ambulatory Visit: Payer: Self-pay

## 2019-02-16 ENCOUNTER — Ambulatory Visit (INDEPENDENT_AMBULATORY_CARE_PROVIDER_SITE_OTHER): Payer: Medicare Other | Admitting: Family Medicine

## 2019-02-16 DIAGNOSIS — M1990 Unspecified osteoarthritis, unspecified site: Secondary | ICD-10-CM | POA: Diagnosis not present

## 2019-02-16 DIAGNOSIS — I10 Essential (primary) hypertension: Secondary | ICD-10-CM

## 2019-02-16 DIAGNOSIS — B029 Zoster without complications: Secondary | ICD-10-CM | POA: Diagnosis not present

## 2019-02-16 DIAGNOSIS — E782 Mixed hyperlipidemia: Secondary | ICD-10-CM

## 2019-02-16 DIAGNOSIS — E1165 Type 2 diabetes mellitus with hyperglycemia: Secondary | ICD-10-CM | POA: Diagnosis not present

## 2019-02-16 MED ORDER — VALACYCLOVIR HCL 1 G PO TABS: 1000.0000 mg | ORAL_TABLET | Freq: Three times a day (TID) | ORAL | 0 refills | Status: DC

## 2019-02-16 MED FILL — valACYclovir HCL 1 GM TABS: 1 | 7 days supply | Qty: 21 | Fill #0

## 2019-02-18 DIAGNOSIS — B029 Zoster without complications: Secondary | ICD-10-CM | POA: Insufficient documentation

## 2019-02-18 NOTE — Progress Notes (Signed)
Virtual Visit via phone Note  I connected with [name] on 02/16/19 at  9:00 AM EDT by a phone enabled telemedicine application and verified that I am speaking with the correct person using two identifiers.  Location: Patient: home Provider: home   I discussed the limitations of evaluation and management by telemedicine and the availability of in person appointments. The patient expressed understanding and agreed to proceed. [name], CMA was able to get the patient set up on visit, phone after trying to set up a video visit    Subjective:    Patient ID: [name], female    DOB: [birth year], 68 y.o.   MRN: [id number]  No chief complaint on file.   HPI Patient is in today for follow up on chronic medical concerns including diabetes, hyperlipidemia and more. No recent febrile illness or hospitalizations. She is noting a rash on her arm that is painful and pruritic that is improving some now and started about a week ago. Also noted some fleeting pain in left chest chest wall lasts for seconds is at junction with abdomen anteriorly and has not associated symptoms. Has only happened a couple of times. Denies palp/SOB/HA/congestion/fevers/GI or GU c/o. Taking meds as prescribed  Past Medical History:  Diagnosis Date  . Acute bronchitis 10/31/2015  . Allergy    seasonal  . Arthritis    knee-left  . Cancer ([place]) 04/2018   right breast ca  . Cataract    right eye  . Diabetes mellitus 52   type 2  . Diaphragmatic hernia without mention of obstruction or gangrene   . Endometriosis   . Esophageal reflux   . Esophageal stricture   . GERD (gastroesophageal reflux disease)   . Hyperlipidemia   . Hyperplastic colon polyp   . Hypertension 52  . Nonalcoholic fatty liver disease 04/28/2010   Qualifier: Diagnosis of  By: [name], Balinda Quails   . Nonspecific abnormal results of liver function study   . Personal history of radiation therapy 08/2018   bilateral breasts  .  Pharyngitis 02/18/2012  . Plantar fascial fibromatosis   . Preventative health care 01/22/2013  . Seasonal allergies 10/31/2015  . Sleep apnea    has oral appliance but does not fit well due to missing teeth  . Sore throat 04/03/2017  . Tubular adenoma of colon   . Unspecified sleep apnea    no c-pap    Past Surgical History:  Procedure Laterality Date  . ABDOMINAL HYSTERECTOMY  1982   partial  . APPENDECTOMY    . 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 BREAST BIOPSY Bilateral 04/03/2018  . BREAST BIOPSY Bilateral 03/23/2018  . BREAST CYST INCISION AND DRAINAGE     right breast  . BREAST EXCISIONAL BIOPSY Right 2019  . BREAST LUMPECTOMY Bilateral 04/2018  . BREAST LUMPECTOMY WITH RADIOACTIVE SEED LOCALIZATION Bilateral 05/02/2018   Procedure: RIGHT BREAST SEED LUMPECTOMY X2 AND LEFT BREAST SEED GUIDED LUMPECTOMY;  Surgeon: [name], Benson;  Location: [facility];  Service: General;  Laterality: Bilateral;  . CATARACT EXTRACTION     right eye  . HERNIA REPAIR     per pt, she is not aware of this surgery  . RE-EXCISION OF BREAST LUMPECTOMY Right 05/25/2018   Procedure: RE-EXCISION OF RIGHT BREAST LUMPECTOMY;  Surgeon: [name], Benson;  Location: [facility];  Service: General;  Laterality: Right;  . seba    . TUBAL LIGATION  Family History  Problem Relation Age of Onset  . Breast cancer Mother   . Hypertension Mother   . Diabetes Mother        type 2  . Other Mother        esophagus surgery  . Heart disease Father   . Hyperlipidemia Father   . Hypertension Father   . Heart attack Father        MI at age 63  . Diabetes Brother        type 2  . Heart disease Maternal Grandmother   . Heart attack Maternal Grandfather   . Hypertension Maternal Grandfather   . Stroke Paternal Grandmother   . Hypertension Sister   . Diabetes Sister   . Hypertension Brother   . Alcohol abuse Maternal Aunt   . Cancer Maternal Aunt   . Colon cancer Neg Hx    . Stomach cancer Neg Hx   . Esophageal cancer Neg Hx     Social History   Socioeconomic History  . Marital status: Married    Spouse name: [name]  . Number of children: 2  . Years of education: Not on file  . Highest education level: Not on file  Occupational History  . Occupation: Building surveyor: [employer]  . Financial resource strain: Not on file  . Food insecurity    Worry: Not on file    Inability: Not on file  . Transportation needs    Medical: No    Non-medical: No  Tobacco Use  . Smoking status: Never Smoker  . Smokeless tobacco: Never Used  Substance and Sexual Activity  . Alcohol use: No    Alcohol/week: 0.0 standard drinks  . Drug use: No  . Sexual activity: Yes    Birth control/protection: Post-menopausal    Comment: lives with husband, no dietary restrictions just watching carbs, wears seat belt  Lifestyle  . Physical activity    Days per week: Not on file    Minutes per session: Not on file  . Stress: Not on file  Relationships  . Social Herbalist on phone: Not on file    Gets together: Not on file    Attends religious service: Not on file    Active member of club or organization: Not on file    Attends meetings of clubs or organizations: Not on file    Relationship status: Not on file  . Intimate partner violence    Fear of current or ex partner: No    Emotionally abused: No    Physically abused: No    Forced sexual activity: No  Other Topics Concern  . Not on file  Social History Narrative  . Not on file    Outpatient Medications Prior to Visit  Medication Sig Dispense Refill  . albuterol (VENTOLIN HFA) 108 (90 Base) MCG/ACT inhaler Inhale 2 puffs into the lungs every 6 (six) hours as needed for wheezing or shortness of breath. 1 Inhaler 0  . ASPIR-LOW 81 MG EC tablet TAKE 1 TABLET BY MOUTH DAILY 30 tablet 3  . bisoprolol-hydrochlorothiazide (ZIAC) 5-6.25 MG tablet TAKE 1 TABLET BY MOUTH DAILY. 90  tablet 0  . canagliflozin (INVOKANA) 100 MG TABS tablet Take 1 tablet (100 mg total) by mouth daily before breakfast. Patient stopped in January due to cost    . fluticasone (FLONASE) 50 MCG/ACT nasal spray Place 2 sprays into both nostrils daily. 16 g 6  .  glimepiride (AMARYL) 2 MG tablet TAKE 1-2 TABLETS (2-4 MG TOTAL) BY MOUTH 2 (TWO) TIMES DAILY. 120 tablet 1  . glucose blood (CONTOUR NEXT TEST) test strip USE TO CHECK BLOOD SUGAR DAILY AS NEEDED DX:E11.9 100 each 5  . metFORMIN (GLUCOPHAGE) 1000 MG tablet TAKE 1 TABLET BY MOUTH TWICE DAILY AND 1/2 TABLET AT NOON 75 tablet 1  . montelukast (SINGULAIR) 10 MG tablet Take 1 tablet (10 mg total) by mouth at bedtime as needed. 30 tablet 3  . omeprazole (PRILOSEC) 40 MG capsule Take 1 capsule (40 mg total) by mouth 2 (two) times daily. 180 capsule 3  . rosuvastatin (CRESTOR) 5 MG tablet Take 1 tablet (5 mg total) by mouth daily. 90 tablet 3   Facility-Administered Medications Prior to Visit  Medication Dose Route Frequency Provider Last Rate Last Dose  . Chlorhexidine Gluconate Cloth 2 % PADS 6 each  6 each Topical Once [name], Benson       And  . Chlorhexidine Gluconate Cloth 2 % PADS 6 each  6 each Topical Once [name], Benson        Allergies  Allergen Reactions  . Pravastatin Other (See Comments)    Myalgia, fatigue, memory loss  . Amoxicillin Itching    REACTION: rash  . Atorvastatin Other (See Comments)    Myalgia, fatigue, memory loss  . Codeine Nausea And Vomiting    Review of Systems  Constitutional: Negative for fever and malaise/fatigue.  HENT: Negative for congestion.   Eyes: Negative for blurred vision.  Respiratory: Negative for shortness of breath.   Cardiovascular: Positive for chest pain. Negative for palpitations and leg swelling.  Gastrointestinal: Negative for abdominal pain, blood in stool and nausea.  Genitourinary: Negative for dysuria and frequency.  Musculoskeletal: Negative for falls.  Skin: Positive  for itching and rash.  Neurological: Negative for dizziness, loss of consciousness and headaches.  Endo/Heme/Allergies: Negative for environmental allergies.  Psychiatric/Behavioral: Negative for depression. The patient is not nervous/anxious.        Objective:    Physical Exam unable to obtain via the phone.   There were no vitals taken for this visit. Wt Readings from Last 3 Encounters:  08/14/18 184 lb 11.2 oz (83.8 kg)  08/10/18 188 lb 12.8 oz (85.6 kg)  06/22/18 190 lb (86.2 kg)    Diabetic Foot Exam - Simple   No data filed     Lab Results  Component Value Date   WBC 5.5 11/29/2018   HGB 12.6 11/29/2018   HCT 37.9 11/29/2018   PLT 236.0 11/29/2018   GLUCOSE 116 (H) 11/29/2018   CHOL 219 (H) 11/29/2018   TRIG 259.0 (H) 11/29/2018   HDL 35.40 (L) 11/29/2018   LDLDIRECT 146.0 11/29/2018   LDLCALC 152 (H) 05/09/2018   ALT 40 (H) 11/29/2018   AST 25 11/29/2018   NA 138 11/29/2018   K 3.9 11/29/2018   CL 100 11/29/2018   CREATININE 0.92 11/29/2018   BUN 19 11/29/2018   CO2 25 11/29/2018   TSH 1.19 11/29/2018   HGBA1C 8.0 (H) 11/29/2018   MICROALBUR <0.7 12/04/2015    Lab Results  Component Value Date   TSH 1.19 11/29/2018   Lab Results  Component Value Date   WBC 5.5 11/29/2018   HGB 12.6 11/29/2018   HCT 37.9 11/29/2018   MCV 82.0 11/29/2018   PLT 236.0 11/29/2018   Lab Results  Component Value Date   NA 138 11/29/2018   K 3.9 11/29/2018   CO2  25 11/29/2018   GLUCOSE 116 (H) 11/29/2018   BUN 19 11/29/2018   CREATININE 0.92 11/29/2018   BILITOT 0.4 11/29/2018   ALKPHOS 71 11/29/2018   AST 25 11/29/2018   ALT 40 (H) 11/29/2018   PROT 7.2 11/29/2018   ALBUMIN 4.2 11/29/2018   CALCIUM 9.7 11/29/2018   ANIONGAP 11 04/26/2018   GFR 73.85 11/29/2018   Lab Results  Component Value Date   CHOL 219 (H) 11/29/2018   Lab Results  Component Value Date   HDL 35.40 (L) 11/29/2018   Lab Results  Component Value Date   LDLCALC 152 (H) 05/09/2018    Lab Results  Component Value Date   TRIG 259.0 (H) 11/29/2018   Lab Results  Component Value Date   CHOLHDL 6 11/29/2018   Lab Results  Component Value Date   HGBA1C 8.0 (H) 11/29/2018       Assessment & Plan:   Problem List Items Addressed This Visit    Type 2 diabetes mellitus with hyperglycemia, without long-term current use of insulin (HCC)    hgba1c acceptable, minimize simple carbs. Increase exercise as tolerated.      Relevant Orders   Hemoglobin A1c   Hyperlipidemia, mixed    Encouraged heart healthy diet, increase exercise, avoid trans fats, consider a krill oil cap daily      Relevant Orders   Lipid panel   Essential hypertension - Primary    Check vitals weekly. no changes to meds. Encouraged heart healthy diet such as the DASH diet and exercise as tolerated.       Relevant Orders   CBC   Comprehensive metabolic panel   TSH   Osteoarthritis    She is noting some fleeting pains in her left chest wall without associated symptoms. It happened a couple of times last month and lasted only seconds. She will let us know if increases in intensity and frequency. May try topical salves prn for now.       Shingles    On arm, Started on Valtrex       Relevant Medications   valACYclovir (VALTREX) 1000 MG tablet      I am having Britani Mazzeo start on valACYclovir. I am also having her maintain her Aspir-Low, omeprazole, glucose blood, canagliflozin, albuterol, fluticasone, montelukast, rosuvastatin, glimepiride, bisoprolol-hydrochlorothiazide, and metFORMIN.  Meds ordered this encounter  Medications  . valACYclovir (VALTREX) 1000 MG tablet    Sig: Take 1 tablet (1,000 mg total) by mouth 3 (three) times daily.    Dispense:  21 tablet    Refill:  0      I discussed the assessment and treatment plan with the patient. The patient was provided an opportunity to ask questions and all were answered. The patient agreed with the plan and demonstrated an  understanding of the instructions.   The patient was advised to call back or seek an in-person evaluation if the symptoms worsen or if the condition fails to improve as anticipated.  I provided 25 minutes of non-face-to-face time during this encounter.   [name], Benson

## 2019-02-18 NOTE — Assessment & Plan Note (Signed)
Encouraged heart healthy diet, increase exercise, avoid trans fats, consider a krill oil cap daily 

## 2019-02-18 NOTE — Assessment & Plan Note (Signed)
Check vitals weekly no changes to meds. Encouraged heart healthy diet such as the DASH diet and exercise as tolerated.  

## 2019-02-18 NOTE — Assessment & Plan Note (Signed)
On arm, Started on Valtrex

## 2019-02-18 NOTE — Assessment & Plan Note (Signed)
She is noting some fleeting pains in her left chest wall without associated symptoms. It happened a couple of times last month and lasted only seconds. She will let us know if increases in intensity and frequency. May try topical salves prn for now.

## 2019-02-18 NOTE — Assessment & Plan Note (Signed)
hgba1c acceptable, minimize simple carbs. Increase exercise as tolerated.  

## 2019-02-20 ENCOUNTER — Ambulatory Visit: Payer: Medicare Other | Admitting: Family Medicine

## 2019-02-26 DIAGNOSIS — Z853 Personal history of malignant neoplasm of breast: Secondary | ICD-10-CM | POA: Diagnosis not present

## 2019-03-05 ENCOUNTER — Other Ambulatory Visit: Payer: Self-pay

## 2019-03-05 ENCOUNTER — Other Ambulatory Visit (INDEPENDENT_AMBULATORY_CARE_PROVIDER_SITE_OTHER): Payer: Medicare Other

## 2019-03-05 ENCOUNTER — Ambulatory Visit (INDEPENDENT_AMBULATORY_CARE_PROVIDER_SITE_OTHER): Payer: Medicare Other | Admitting: *Deleted

## 2019-03-05 DIAGNOSIS — E1165 Type 2 diabetes mellitus with hyperglycemia: Secondary | ICD-10-CM

## 2019-03-05 DIAGNOSIS — Z23 Encounter for immunization: Secondary | ICD-10-CM | POA: Diagnosis not present

## 2019-03-05 DIAGNOSIS — I10 Essential (primary) hypertension: Secondary | ICD-10-CM | POA: Diagnosis not present

## 2019-03-05 DIAGNOSIS — E782 Mixed hyperlipidemia: Secondary | ICD-10-CM

## 2019-03-05 LAB — COMPREHENSIVE METABOLIC PANEL
ALT: 38 U/L — ABNORMAL HIGH (ref 0–35)
AST: 27 U/L (ref 0–37)
Albumin: 4.2 g/dL (ref 3.5–5.2)
Alkaline Phosphatase: 71 U/L (ref 39–117)
BUN: 13 mg/dL (ref 6–23)
CO2: 30 mEq/L (ref 19–32)
Calcium: 10.1 mg/dL (ref 8.4–10.5)
Chloride: 99 mEq/L (ref 96–112)
Creatinine, Ser: 0.86 mg/dL (ref 0.40–1.20)
GFR: 79.76 mL/min (ref >60.00)
Glucose, Bld: 296 mg/dL — ABNORMAL HIGH (ref 70–99)
Potassium: 4.2 mEq/L (ref 3.5–5.1)
Sodium: 138 mEq/L (ref 135–145)
Total Bilirubin: 0.4 mg/dL (ref 0.2–1.2)
Total Protein: 7 g/dL (ref 6.0–8.3)

## 2019-03-05 LAB — LIPID PANEL
Cholesterol: 142 mg/dL (ref 0–200)
HDL: 43 mg/dL (ref >39.00)
NonHDL: 98.64
Total CHOL/HDL Ratio: 3
Triglycerides: 201 mg/dL — ABNORMAL HIGH (ref 0.0–149.0)
VLDL: 40.2 mg/dL — ABNORMAL HIGH (ref 0.0–40.0)

## 2019-03-05 LAB — CBC
HCT: 37.7 % (ref 36.0–46.0)
Hemoglobin: 12.2 g/dL (ref 12.0–15.0)
MCHC: 32.5 g/dL (ref 30.0–36.0)
MCV: 80.8 fl (ref 78.0–100.0)
Platelets: 234 10*3/uL (ref 150.0–400.0)
RBC: 4.66 Mil/uL (ref 3.87–5.11)
RDW: 14.2 % (ref 11.5–15.5)
WBC: 5.4 10*3/uL (ref 4.0–10.5)

## 2019-03-05 LAB — TSH: TSH: 1.32 u[IU]/mL (ref 0.35–4.50)

## 2019-03-05 LAB — LDL CHOLESTEROL, DIRECT: Direct LDL: 82 mg/dL

## 2019-03-05 LAB — HEMOGLOBIN A1C: Hgb A1c MFr Bld: 8.4 % — ABNORMAL HIGH (ref 4.6–6.5)

## 2019-03-05 NOTE — Progress Notes (Signed)
Patient here today for flu vaccine.  Vaccine given and patient tolerated well. 

## 2019-03-06 ENCOUNTER — Encounter: Payer: Self-pay | Admitting: Oncology

## 2019-03-12 ENCOUNTER — Other Ambulatory Visit: Payer: Self-pay | Admitting: Family Medicine

## 2019-03-12 MED FILL — ROSUVASTATIN CALCIUM 5 MG T: 5 | 90 days supply | Qty: 90 | Fill #0

## 2019-03-12 MED FILL — metFORMIN HCL 1000 MG TABS: 1000 | 30 days supply | Qty: 75 | Fill #0

## 2019-03-13 MED FILL — GLIMEPIRIDE 2 MG TABLET: 2 | 30 days supply | Qty: 120 | Fill #0

## 2019-04-09 DIAGNOSIS — D0510 Intraductal carcinoma in situ of unspecified breast: Secondary | ICD-10-CM | POA: Diagnosis not present

## 2019-04-09 DIAGNOSIS — N898 Other specified noninflammatory disorders of vagina: Secondary | ICD-10-CM | POA: Diagnosis not present

## 2019-04-09 DIAGNOSIS — N952 Postmenopausal atrophic vaginitis: Secondary | ICD-10-CM | POA: Diagnosis not present

## 2019-04-09 DIAGNOSIS — Z01419 Encounter for gynecological examination (general) (routine) without abnormal findings: Secondary | ICD-10-CM | POA: Diagnosis not present

## 2019-04-13 ENCOUNTER — Other Ambulatory Visit: Payer: Self-pay | Admitting: Family Medicine

## 2019-04-13 MED FILL — GLIMEPIRIDE 2 MG TABLET: 2 | 30 days supply | Qty: 120 | Fill #1

## 2019-04-13 MED FILL — OMEPRAZOLE 40 MG CPDR: 40 | 90 days supply | Qty: 180 | Fill #0

## 2019-04-13 MED FILL — BISOPROLOL-HCTZ 5-6.25 MG T: 5-6.25 | 90 days supply | Qty: 90 | Fill #0

## 2019-04-13 MED FILL — metFORMIN HCL 1000 MG TABS: 1000 | 30 days supply | Qty: 75 | Fill #0

## 2019-04-16 NOTE — Progress Notes (Signed)
[place]  [phone] [phone]    ID: [name] DOB: [birth year]  MR#: [id number]  [id number]  Patient Care Team: Mosie Lukes, MD as PCP - General (Family Medicine) Jola Schmidt, MD as Consulting Physician (Ophthalmology) Royston Sinner Colin Benton, MD as Consulting Physician (Obstetrics and Gynecology) [name], MD as Consulting Physician (General Surgery) , Virgie Dad, MD as Consulting Physician (Oncology) Philemon Kingdom, MD as Consulting Physician (Internal Medicine) Dene Gentry, MD as Consulting Physician (Sports Medicine) OTHER MD:   CHIEF COMPLAINT: Estrogen receptor negative ductal carcinoma in situ  CURRENT TREATMENT: Observation   INTERVAL HISTORY: [name] returns today for follow-up of her estrogen receptor negative ductal carcinoma in situ.   Since her last visit, she presented with changes to her right breast on 12/21/2018. She saw Wilber Bihari, NP, who examined her and noted thickening and a mass in the right upper breast above her lumpectomy incision site at 12 o'clock. She underwent right diagnostic mammography with tomography and right breast ultrasonography at [facility] on 01/08/2019 showing: breast density category C; no evidence of new or recurrent breast carcinoma; the palpable abnormality corresponds to postsurgical/postradiation change in the upper right breast, including an underlying 4.5 cm postoperative seroma.  She also underwent bilateral diagnostic mammography with tomography at [facility] on 02/26/2019 showing: breast density category C; stable left breast seroma; no evidence of malignancy in either breast.   REVIEW OF SYSTEMS: She denies unusual headaches visual changes cough phlegm production pleurisy shortness of breath or change in bowel or bladder habits.  A detailed review of systems today was otherwise stable  HISTORY OF CURRENT ILLNESS: From the original intake note:   [name] had routine screening mammography on 02/20/2018 at [facility] showing a possible abnormality in the right breast. She underwent unilateral right diagnostic mammography with tomography and right breast ultrasonography at [facility] on 03/10/2018 showing: breast density category C. There is a cluster of heterogeneous calcifications in the right breast upper central quadrant. There was no sonographic abnormalities or lymphadenopathy in the right axilla.  Accordingly on 03/07/2018 she proceeded to biopsy of the right breast area in question. The pathology from this procedure showed [id number] Ductal carcinoma in situ, high grade. There are foci suspicious for early stromal invasion. Prognostic indicators significant for: both estrogen receptor and progesterone receptor, 0% negative.  The patient's subsequent history is as detailed below.   PAST MEDICAL HISTORY: Past Medical History:  Diagnosis Date  . Acute bronchitis 10/31/2015  . Allergy    seasonal  . Arthritis    knee-left  . Cancer ([place]) 04/2018   right breast ca  . Cataract    right eye  . Diabetes mellitus 52   type 2  . Diaphragmatic hernia without mention of obstruction or gangrene   . Endometriosis   . Esophageal reflux   . Esophageal stricture   . GERD (gastroesophageal reflux disease)   . Hyperlipidemia   . Hyperplastic colon polyp   . Hypertension 52  . Nonalcoholic fatty liver disease 04/28/2010   Qualifier: Diagnosis of  By: Arnoldo Morale MD, Balinda Quails   . Nonspecific abnormal results of liver function study   . Personal history of radiation therapy 08/2018   bilateral breasts  . Pharyngitis 02/18/2012  . Plantar fascial fibromatosis   . Preventative health care 01/22/2013  . Seasonal allergies 10/31/2015  . Sleep apnea    has oral appliance but does not fit well due to missing teeth  . Sore throat 04/03/2017  .  Tubular adenoma of colon   . Unspecified sleep apnea    no c-pap    PAST SURGICAL HISTORY: Past  Surgical History:  Procedure Laterality Date  . ABDOMINAL HYSTERECTOMY  1982   partial  . APPENDECTOMY    . 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 BREAST BIOPSY Bilateral 04/03/2018  . BREAST BIOPSY Bilateral 03/23/2018  . BREAST CYST INCISION AND DRAINAGE     right breast  . BREAST EXCISIONAL BIOPSY Right 2019  . BREAST LUMPECTOMY Bilateral 04/2018  . BREAST LUMPECTOMY WITH RADIOACTIVE SEED LOCALIZATION Bilateral 05/02/2018   Procedure: RIGHT BREAST SEED LUMPECTOMY X2 AND LEFT BREAST SEED GUIDED LUMPECTOMY;  Surgeon: [name], MD;  Location: [facility];  Service: General;  Laterality: Bilateral;  . CATARACT EXTRACTION     right eye  . HERNIA REPAIR     per pt, she is not aware of this surgery  . RE-EXCISION OF BREAST LUMPECTOMY Right 05/25/2018   Procedure: RE-EXCISION OF RIGHT BREAST LUMPECTOMY;  Surgeon: [name], MD;  Location: [facility];  Service: General;  Laterality: Right;  . seba    . TUBAL LIGATION    Partial hysterectomy   FAMILY HISTORY: Family History  Problem Relation Age of Onset  . Breast cancer Mother   . Hypertension Mother   . Diabetes Mother        type 2  . Other Mother        esophagus surgery  . Heart disease Father   . Hyperlipidemia Father   . Hypertension Father   . Heart attack Father        MI at age 84  . Diabetes Brother        type 2  . Heart disease Maternal Grandmother   . Heart attack Maternal Grandfather   . Hypertension Maternal Grandfather   . Stroke Paternal Grandmother   . Hypertension Sister   . Diabetes Sister   . Hypertension Brother   . Alcohol abuse Maternal Aunt   . Cancer Maternal Aunt   . Colon cancer Neg Hx   . Stomach cancer Neg Hx   . Esophageal cancer Neg Hx    The patient's father died at age 62 due to CHF. The patient's mother died at age 34 due to breast cancer diagnosed at age 77. The patient had 3 brothers and 3 sisters. There was a paternal aunt with breast cancer  diagnosed in the 54's. There was also a maternal aunt with ovarian cancer that passed away. The patient notes that she had prior genetics testing in 2018 for ovarian and breast under Dr. Royston Sinner with normal results.    GYNECOLOGIC HISTORY:  No LMP recorded. Patient has had a hysterectomy. Menarche: 68 years old Age at first live birth: 68 years old She is [place] P2.  She is status post partial hysterectomy without BSO. Her LMP: was in 53 ( late 30's). She took hormone replacement for 3 years. She also took oral contraception for 3 years with no complications.    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 Her husband, [name] "Dexter" works in transportation and also worked as a Dealer. The patient's daughter, Anderson Malta lives in [place] and is a Hotel manager. The patient's daughter, Doroteo Bradford lives in [place] and works as a Engineer, mining. The patient has 2 grandchildren and 2 great grandchildren.   ADVANCED DIRECTIVES:    HEALTH MAINTENANCE: Social History   Tobacco Use  .  Smoking status: Never Smoker  . Smokeless tobacco: Never Used  Substance Use Topics  . Alcohol use: No    Alcohol/week: 0.0 standard drinks  . Drug use: No     Colonoscopy: 11/18/2015/ Dr. Fuller Plan  PAP: 03/2017  Bone density: 2018   Allergies  Allergen Reactions  . Pravastatin Other (See Comments)    Myalgia, fatigue, memory loss  . Amoxicillin Itching    REACTION: rash  . Atorvastatin Other (See Comments)    Myalgia, fatigue, memory loss  . Codeine Nausea And Vomiting    Current Outpatient Medications  Medication Sig Dispense Refill  . albuterol (VENTOLIN HFA) 108 (90 Base) MCG/ACT inhaler Inhale 2 puffs into the lungs every 6 (six) hours as needed for wheezing or shortness of breath. 1 Inhaler 0  . ASPIR-LOW 81 MG EC tablet TAKE 1 TABLET BY MOUTH DAILY 30 tablet 3  . bisoprolol-hydrochlorothiazide (ZIAC) 5-6.25 MG tablet TAKE 1 TABLET BY MOUTH DAILY. 90 tablet 0   . canagliflozin (INVOKANA) 100 MG TABS tablet Take 1 tablet (100 mg total) by mouth daily before breakfast. Patient stopped in January due to cost    . fluticasone (FLONASE) 50 MCG/ACT nasal spray Place 2 sprays into both nostrils daily. 16 g 6  . glimepiride (AMARYL) 2 MG tablet TAKE 1 TO 2 TABLETS BY MOUTH TWICE DAILY 120 tablet 1  . glucose blood (CONTOUR NEXT TEST) test strip USE TO CHECK BLOOD SUGAR DAILY AS NEEDED DX:E11.9 100 each 5  . metFORMIN (GLUCOPHAGE) 1000 MG tablet TAKE 1 TABLET BY MOUTH TWICE DAILY AND 1/2 TABLET AT NOON 75 tablet 0  . montelukast (SINGULAIR) 10 MG tablet Take 1 tablet (10 mg total) by mouth at bedtime as needed. 30 tablet 3  . omeprazole (PRILOSEC) 40 MG capsule TAKE 1 CAPSULE (40 MG TOTAL) BY MOUTH 2 (TWO) TIMES DAILY. 180 capsule 1  . rosuvastatin (CRESTOR) 5 MG tablet Take 1 tablet (5 mg total) by mouth daily. 90 tablet 3  . valACYclovir (VALTREX) 1000 MG tablet Take 1 tablet (1,000 mg total) by mouth 3 (three) times daily. 21 tablet 0   No current facility-administered medications for this visit.    Facility-Administered Medications Ordered in Other Visits  Medication Dose Route Frequency Provider Last Rate Last Dose  . Chlorhexidine Gluconate Cloth 2 % PADS 6 each  6 each Topical Once [name], MD       And  . Chlorhexidine Gluconate Cloth 2 % PADS 6 each  6 each Topical Once [name], MD        OBJECTIVE: Middle-aged white woman in no acute distress  Vitals:   04/17/19 1500  BP: 127/75  Pulse: 70  Resp: 18  Temp: 98.9 F (37.2 C)  SpO2: 100%     Body mass index is 33.73 kg/m.   Wt Readings from Last 3 Encounters:  04/17/19 190 lb 6.4 oz (86.4 kg)  08/14/18 184 lb 11.2 oz (83.8 kg)  08/10/18 188 lb 12.8 oz (85.6 kg)      ECOG FS:1 - Symptomatic but completely ambulatory  Sclerae unicteric, EOMs intact Wearing a mask No cervical or supraclavicular adenopathy Lungs no rales or rhonchi Heart regular rate and rhythm Abd soft,  nontender, positive bowel sounds MSK no focal spinal tenderness, no upper extremity lymphedema Neuro: nonfocal, well oriented, appropriate affect Breasts: Status post bilateral lumpectomies and bilateral radiation with no evidence of disease recurrence on either side.  Both axillae are benign.   LAB RESULTS:  CMP  Component Value Date/Time   NA 138 03/05/2019 1423   K 4.2 03/05/2019 1423   CL 99 03/05/2019 1423   CO2 30 03/05/2019 1423   GLUCOSE 296 (H) 03/05/2019 1423   GLUCOSE 141 (H) 03/28/2006 0843   BUN 13 03/05/2019 1423   CREATININE 0.86 03/05/2019 1423   CREATININE 1.12 (H) 03/15/2018 1206   CREATININE 0.85 09/27/2013 1224   CALCIUM 10.1 03/05/2019 1423   PROT 7.0 03/05/2019 1423   ALBUMIN 4.2 03/05/2019 1423   AST 27 03/05/2019 1423   AST 44 (H) 03/15/2018 1206   ALT 38 (H) 03/05/2019 1423   ALT 70 (H) 03/15/2018 1206   ALKPHOS 71 03/05/2019 1423   BILITOT 0.4 03/05/2019 1423   BILITOT 0.5 03/15/2018 1206   GFRNONAA >60 04/26/2018 0930   GFRNONAA 50 (L) 03/15/2018 1206   GFRAA >60 04/26/2018 0930   GFRAA 58 (L) 03/15/2018 1206    No results found for: TOTALPROTELP, ALBUMINELP, A1GS, A2GS, BETS, BETA2SER, GAMS, MSPIKE, SPEI  No results found for: KPAFRELGTCHN, LAMBDASER, KAPLAMBRATIO  Lab Results  Component Value Date   WBC 5.4 03/05/2019   NEUTROABS 3.6 03/15/2018   HGB 12.2 03/05/2019   HCT 37.7 03/05/2019   MCV 80.8 03/05/2019   PLT 234.0 03/05/2019    @LASTCHEMISTRY @  No results found for: LABCA2  No components found for: [id number]  No results for input(s): INR in the last 168 hours.  No results found for: LABCA2  No results found for: [id number]  No results found for: [id number]  No results found for: [id number]  No results found for: CA2729  No components found for: HGQUANT  No results found for: CEA1 / No results found for: CEA1   No results found for: AFPTUMOR  No results found for: CHROMOGRNA  No results found for: PSA1  No visits  with results within 3 Day(s) from this visit.  Latest known visit with results is:  Lab on 03/05/2019  Component Date Value Ref Range Status  . TSH 03/05/2019 1.32  0.35 - 4.50 uIU/mL Final  . Cholesterol 03/05/2019 142  0 - 200 mg/dL Final   ATP III Classification       Desirable:  < 200 mg/dL               Borderline High:  200 - 239 mg/dL          High:  > = 240 mg/dL  . Triglycerides 03/05/2019 201.0* 0.0 - 149.0 mg/dL Final   Normal:  <150 mg/dLBorderline High:  150 - 199 mg/dL  . HDL 03/05/2019 43.00  >39.00 mg/dL Final  . VLDL 03/05/2019 40.2* 0.0 - 40.0 mg/dL Final  . Total CHOL/HDL Ratio 03/05/2019 3   Final                  Men          Women1/2 Average Risk     3.4          3.3Average Risk          5.0          4.42X Average Risk          9.6          7.13X Average Risk          15.0          11.0                      . NonHDL 03/05/2019 98.64  Final   NOTE:  Non-HDL goal should be 30 mg/dL higher than patient's LDL goal (i.e. LDL goal of < 70 mg/dL, would have non-HDL goal of < 100 mg/dL)  . Sodium 03/05/2019 138  135 - 145 mEq/L Final  . Potassium 03/05/2019 4.2  3.5 - 5.1 mEq/L Final  . Chloride 03/05/2019 99  96 - 112 mEq/L Final  . CO2 03/05/2019 30  19 - 32 mEq/L Final  . Glucose, Bld 03/05/2019 296* 70 - 99 mg/dL Final  . BUN 03/05/2019 13  6 - 23 mg/dL Final  . Creatinine, Ser 03/05/2019 0.86  0.40 - 1.20 mg/dL Final  . Total Bilirubin 03/05/2019 0.4  0.2 - 1.2 mg/dL Final  . Alkaline Phosphatase 03/05/2019 71  39 - 117 U/L Final  . AST 03/05/2019 27  0 - 37 U/L Final  . ALT 03/05/2019 38* 0 - 35 U/L Final  . Total Protein 03/05/2019 7.0  6.0 - 8.3 g/dL Final  . Albumin 03/05/2019 4.2  3.5 - 5.2 g/dL Final  . Calcium 03/05/2019 10.1  8.4 - 10.5 mg/dL Final  . GFR 03/05/2019 79.76  >60.00 mL/min Final  . WBC 03/05/2019 5.4  4.0 - 10.5 K/uL Final  . RBC 03/05/2019 4.66  3.87 - 5.11 Mil/uL Final  . Platelets 03/05/2019 234.0  150.0 - 400.0 K/uL Final  . Hemoglobin  03/05/2019 12.2  12.0 - 15.0 g/dL Final  . HCT 03/05/2019 37.7  36.0 - 46.0 % Final  . MCV 03/05/2019 80.8  78.0 - 100.0 fl Final  . MCHC 03/05/2019 32.5  30.0 - 36.0 g/dL Final  . RDW 03/05/2019 14.2  11.5 - 15.5 % Final  . Hgb A1c MFr Bld 03/05/2019 8.4* 4.6 - 6.5 % Final   Glycemic Control Guidelines for People with Diabetes:Non Diabetic:  <6%Goal of Therapy: <7%Additional Action Suggested:  >8%   . Direct LDL 03/05/2019 82.0  mg/dL Final   Optimal:  <100 mg/dLNear or Above Optimal:  100-129 mg/dLBorderline High:  130-159 mg/dLHigh:  160-189 mg/dLVery High:  >190 mg/dL    (this displays the last labs from the last 3 days)  No results found for: TOTALPROTELP, ALBUMINELP, A1GS, A2GS, BETS, BETA2SER, GAMS, MSPIKE, SPEI (this displays SPEP labs)  No results found for: KPAFRELGTCHN, LAMBDASER, KAPLAMBRATIO (kappa/lambda light chains)  No results found for: HGBA, HGBA2QUANT, HGBFQUANT, HGBSQUAN (Hemoglobinopathy evaluation)   No results found for: LDH  No results found for: IRON, TIBC, IRONPCTSAT (Iron and TIBC)  No results found for: FERRITIN  Urinalysis    Component Value Date/Time   COLORURINE YELLOW 07/18/2017 0908   APPEARANCEUR CLEAR 07/18/2017 0908   LABSPEC 1.020 07/18/2017 0908   PHURINE 5.5 07/18/2017 0908   GLUCOSEU >=500 (A) 07/18/2017 0908   HGBUR NEGATIVE 07/18/2017 0908   HGBUR negative 08/22/2009 0814   BILIRUBINUR NEGATIVE 07/18/2017 0908   BILIRUBINUR negative 07/27/2016 1535   KETONESUR NEGATIVE 07/18/2017 0908   PROTEINUR NEGATIVE 07/18/2017 0908   UROBILINOGEN 4.0 07/27/2016 1535   UROBILINOGEN 0.2 05/28/2013 1121   NITRITE NEGATIVE 07/18/2017 0908   LEUKOCYTESUR NEGATIVE 07/18/2017 0908     STUDIES: No results found.  ELIGIBLE FOR AVAILABLE RESEARCH PROTOCOL: no  ASSESSMENT: 68 y.o. [place], [place] woman status post right breast biopsy 03/07/2018 for ductal carcinoma in situ, grade 3, estrogen and progesterone receptor negative  (b) breast  biopsy 03/23/2018 showed ductal carcinoma in situ, grade 1, estrogen and progesterone receptor positive  (1) status post right lumpectomy 05/02/2018 for a 0.9 cm ductal carcinoma in situ,  grade 2, with focally positive margins  (a) left lumpectomy 05/02/2018 showed no residual disease  (b) additional surgery for margin clearance 05/25/2018 was successful  (2) adjuvant radiation pleaded 07/31/2018  (3) the patient has had genetics testing through her gynecologist.  Per patient no deleterious mutations found  (4) DEXA scan 02/28/2018 showed a T score of -1.7   PLAN: Edin remains anxious about her breast cancer, but she did decide against taking antiestrogens.  She has some sensitivity soreness and occasional shooting pains in the right breast.  She understands this is not unusual.    I encouraged her to practice breast self-awareness.  She will let us know if she notices any change in either breast.  She is aware of the fact that her blood sugars have been very high and her hemoglobin A1c is over 8.  She is planning to talk to Dr. Cruzita Lederer regarding this.  She understands I am actually more worried about her diabetes than about her breast cancer  I encouraged her to exercise as much as she can and to keep an appropriate diet  She will see me again in 1 year.  She knows to call for any problem that may develop before the next visit.    , Virgie Dad, MD  04/17/19 3:15 PM Medical Oncology and Hematology [facility] [place], [place] [address] Tel. [phone]    Fax. [phone]   I, Wilburn Mylar, am acting as scribe for Dr. Virgie Dad. .  I, Lurline Del MD, have reviewed the above documentation for accuracy and completeness, and I agree with the above.

## 2019-04-17 ENCOUNTER — Inpatient Hospital Stay: Payer: Medicare Other | Attending: Oncology | Admitting: Oncology

## 2019-04-17 ENCOUNTER — Other Ambulatory Visit: Payer: Self-pay

## 2019-04-17 VITALS — BP 127/75 | HR 70 | Temp 98.9°F | Resp 18 | Wt 190.4 lb

## 2019-04-17 DIAGNOSIS — E119 Type 2 diabetes mellitus without complications: Secondary | ICD-10-CM | POA: Insufficient documentation

## 2019-04-17 DIAGNOSIS — E1165 Type 2 diabetes mellitus with hyperglycemia: Secondary | ICD-10-CM

## 2019-04-17 DIAGNOSIS — E785 Hyperlipidemia, unspecified: Secondary | ICD-10-CM | POA: Diagnosis not present

## 2019-04-17 DIAGNOSIS — Z7984 Long term (current) use of oral hypoglycemic drugs: Secondary | ICD-10-CM | POA: Insufficient documentation

## 2019-04-17 DIAGNOSIS — Z8041 Family history of malignant neoplasm of ovary: Secondary | ICD-10-CM | POA: Diagnosis not present

## 2019-04-17 DIAGNOSIS — Z8249 Family history of ischemic heart disease and other diseases of the circulatory system: Secondary | ICD-10-CM | POA: Diagnosis not present

## 2019-04-17 DIAGNOSIS — Z803 Family history of malignant neoplasm of breast: Secondary | ICD-10-CM | POA: Diagnosis not present

## 2019-04-17 DIAGNOSIS — Z79899 Other long term (current) drug therapy: Secondary | ICD-10-CM | POA: Insufficient documentation

## 2019-04-17 DIAGNOSIS — Z90710 Acquired absence of both cervix and uterus: Secondary | ICD-10-CM | POA: Insufficient documentation

## 2019-04-17 DIAGNOSIS — D0512 Intraductal carcinoma in situ of left breast: Secondary | ICD-10-CM | POA: Diagnosis not present

## 2019-04-17 DIAGNOSIS — I10 Essential (primary) hypertension: Secondary | ICD-10-CM | POA: Diagnosis not present

## 2019-04-17 DIAGNOSIS — D0511 Intraductal carcinoma in situ of right breast: Secondary | ICD-10-CM | POA: Diagnosis not present

## 2019-04-17 DIAGNOSIS — Z923 Personal history of irradiation: Secondary | ICD-10-CM | POA: Diagnosis not present

## 2019-04-18 ENCOUNTER — Telehealth: Payer: Self-pay | Admitting: Oncology

## 2019-04-18 NOTE — Telephone Encounter (Signed)
I talk with patient regarding schedule  

## 2019-05-08 ENCOUNTER — Other Ambulatory Visit: Payer: Self-pay

## 2019-05-14 ENCOUNTER — Other Ambulatory Visit: Payer: Self-pay | Admitting: Family Medicine

## 2019-05-15 MED FILL — GLIMEPIRIDE 2 MG TABLET: 2 | 30 days supply | Qty: 120 | Fill #0

## 2019-06-05 ENCOUNTER — Other Ambulatory Visit: Payer: Self-pay | Admitting: Family Medicine

## 2019-06-05 MED FILL — metFORMIN HCL 1000 MG TABS: 1000 | 30 days supply | Qty: 75 | Fill #0

## 2019-06-14 MED FILL — GLIMEPIRIDE 2 MG TABLET: 2 | 30 days supply | Qty: 120 | Fill #1

## 2019-06-18 ENCOUNTER — Other Ambulatory Visit: Payer: Self-pay

## 2019-06-18 ENCOUNTER — Ambulatory Visit: Payer: Medicare Other | Admitting: Family Medicine

## 2019-07-12 ENCOUNTER — Other Ambulatory Visit: Payer: Self-pay | Admitting: Family Medicine

## 2019-07-12 MED FILL — OMEPRAZOLE 40 MG CPDR: 40 | 90 days supply | Qty: 180 | Fill #1

## 2019-07-12 MED FILL — GLIMEPIRIDE 2 MG TABLET: 2 | 30 days supply | Qty: 120 | Fill #0

## 2019-07-12 MED FILL — BISOPROLOL-HCTZ 5-6.25 MG T: 5-6.25 | 90 days supply | Qty: 90 | Fill #0

## 2019-07-12 MED FILL — metFORMIN HCL 1000 MG TABS: 1000 | 90 days supply | Qty: 225 | Fill #0

## 2019-07-20 ENCOUNTER — Ambulatory Visit (INDEPENDENT_AMBULATORY_CARE_PROVIDER_SITE_OTHER): Payer: Medicare Other | Admitting: Family Medicine

## 2019-07-20 ENCOUNTER — Encounter: Payer: Self-pay | Admitting: Family Medicine

## 2019-07-20 ENCOUNTER — Other Ambulatory Visit: Payer: Self-pay

## 2019-07-20 VITALS — BP 133/84 | HR 76 | Wt 192.0 lb

## 2019-07-20 DIAGNOSIS — G4700 Insomnia, unspecified: Secondary | ICD-10-CM | POA: Diagnosis not present

## 2019-07-20 DIAGNOSIS — E782 Mixed hyperlipidemia: Secondary | ICD-10-CM

## 2019-07-20 DIAGNOSIS — R102 Pelvic and perineal pain: Secondary | ICD-10-CM | POA: Diagnosis not present

## 2019-07-20 DIAGNOSIS — E1165 Type 2 diabetes mellitus with hyperglycemia: Secondary | ICD-10-CM

## 2019-07-20 DIAGNOSIS — I10 Essential (primary) hypertension: Secondary | ICD-10-CM | POA: Diagnosis not present

## 2019-07-20 DIAGNOSIS — R109 Unspecified abdominal pain: Secondary | ICD-10-CM | POA: Diagnosis not present

## 2019-07-20 MED ORDER — ROSUVASTATIN CALCIUM 5 MG PO TABS: 5.0000 mg | ORAL_TABLET | Freq: Every day | ORAL | 1 refills | Status: DC

## 2019-07-20 MED ORDER — NITROFURANTOIN MONOHYD MACRO 100 MG PO CAPS: 100.0000 mg | ORAL_CAPSULE | Freq: Two times a day (BID) | ORAL | 0 refills | Status: DC

## 2019-07-20 MED ORDER — GLIMEPIRIDE 4 MG PO TABS: 4.0000 mg | ORAL_TABLET | Freq: Every day | ORAL | 1 refills | Status: DC

## 2019-07-20 MED FILL — ROSUVASTATIN CALCIUM 5 MG T: 5 | 90 days supply | Qty: 90 | Fill #0

## 2019-07-20 MED FILL — GLIMEPIRIDE 4 MG TABLET: 4 | 90 days supply | Qty: 90 | Fill #0

## 2019-07-20 MED FILL — NITROFURANTOIN MONO-MCR 100: 100 | 7 days supply | Qty: 14 | Fill #0

## 2019-07-20 NOTE — Assessment & Plan Note (Signed)
hgba1c unacceptable, minimize simple carbs. Increase exercise as tolerated.

## 2019-07-20 NOTE — Patient Instructions (Addendum)
Omron Blood Pressure cuff, upper arm, want BP 100-140/60-90 Pulse oximeter, want oxygen in 90s  Weekly vitals  Take Multivitamin with minerals, selenium Vitamin D 1000-2000 IU daily Probiotic with lactobacillus and bifidophilus Asprin EC 81 mg daily  Melatonin 2-5 mg at bedtime  [email or web address]  Encouraged increased hydration and fiber in diet. Daily probiotics. If bowels not moving can use MOM 2 tbls po in 4 oz of warm prune juice by mouth every 2-3 days. If no results then repeat in 4 hours with  Dulcolax suppository pr, may repeat again in 4 more hours as needed. Seek care if symptoms worsen. Consider daily Miralax and/or Dulcolax if symptoms persist.   Add benefiber once to twice daily and a probiotic daily

## 2019-07-23 DIAGNOSIS — G4700 Insomnia, unspecified: Secondary | ICD-10-CM | POA: Insufficient documentation

## 2019-07-23 NOTE — Assessment & Plan Note (Signed)
Monitor and report any concerns. no changes to meds. Encouraged heart healthy diet such as the DASH diet and exercise as tolerated.  

## 2019-07-23 NOTE — Assessment & Plan Note (Signed)
Encouraged heart healthy diet, increase exercise, avoid trans fats, consider a krill oil cap daily 

## 2019-07-23 NOTE — Progress Notes (Signed)
Virtual Visit via phone Note  I connected with [name] on 07/20/19 at  9:20 AM EST by a phone enabled telemedicine application and verified that I am speaking with the correct person using two identifiers.  Location: Patient: home Provider: home   I discussed the limitations of evaluation and management by telemedicine and the availability of in person appointments. The patient expressed understanding and agreed to proceed. CMA was able to get the patient set up on a phone visit after being unable to set up a video visit.    Subjective:    Patient ID: [name], female    DOB: 1951-05-19, 69 y.o.   MRN: [id number]  No chief complaint on file.   HPI Patient is in today for follow up on chronic medical concerns. No recent febrile illness or hospitalizations. She is struggling with some congestion, nasal since starting to use her CPAP. She does feel less fatigued. She is noting some intermittent trouble with abdominal bloating and some suprapubic pain recently. No fevers or chills. Sugars have been labile recently with numbers as high as 180 but more often below 150. Denies CP/palp/SOB/HA/congestion/fevers/GI or GU c/o. Taking meds as prescribed  Past Medical History:  Diagnosis Date  . Acute bronchitis 10/31/2015  . Allergy    seasonal  . Arthritis    knee-left  . Cancer ([place]) 04/2018   right breast ca  . Cataract    right eye  . Diabetes mellitus 52   type 2  . Diaphragmatic hernia without mention of obstruction or gangrene   . Endometriosis   . Esophageal reflux   . Esophageal stricture   . GERD (gastroesophageal reflux disease)   . Hyperlipidemia   . Hyperplastic colon polyp   . Hypertension 52  . Nonalcoholic fatty liver disease 04/28/2010   Qualifier: Diagnosis of  By: Arnoldo Morale MD, Balinda Quails   . Nonspecific abnormal results of liver function study   . Personal history of radiation therapy 08/2018   bilateral breasts  . Pharyngitis 02/18/2012  . Plantar fascial  fibromatosis   . Preventative health care 01/22/2013  . Seasonal allergies 10/31/2015  . Sleep apnea    has oral appliance but does not fit well due to missing teeth  . Sore throat 04/03/2017  . Tubular adenoma of colon   . Unspecified sleep apnea    no c-pap    Past Surgical History:  Procedure Laterality Date  . ABDOMINAL HYSTERECTOMY  1982   partial  . APPENDECTOMY    . 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 BREAST BIOPSY Bilateral 04/03/2018  . BREAST BIOPSY Bilateral 03/23/2018  . BREAST CYST INCISION AND DRAINAGE     right breast  . BREAST EXCISIONAL BIOPSY Right 2019  . BREAST LUMPECTOMY Bilateral 04/2018  . BREAST LUMPECTOMY WITH RADIOACTIVE SEED LOCALIZATION Bilateral 05/02/2018   Procedure: RIGHT BREAST SEED LUMPECTOMY X2 AND LEFT BREAST SEED GUIDED LUMPECTOMY;  Surgeon: [name], MD;  Location: [facility];  Service: General;  Laterality: Bilateral;  . CATARACT EXTRACTION     right eye  . HERNIA REPAIR     per pt, she is not aware of this surgery  . RE-EXCISION OF BREAST LUMPECTOMY Right 05/25/2018   Procedure: RE-EXCISION OF RIGHT BREAST LUMPECTOMY;  Surgeon: [name], MD;  Location: [facility];  Service: General;  Laterality: Right;  . seba    . TUBAL LIGATION      Family History  Problem Relation Age of  Onset  . Breast cancer Mother   . Hypertension Mother   . Diabetes Mother        type 2  . Other Mother        esophagus surgery  . Heart disease Father   . Hyperlipidemia Father   . Hypertension Father   . Heart attack Father        MI at age 107  . Diabetes Brother        type 2  . Heart disease Maternal Grandmother   . Heart attack Maternal Grandfather   . Hypertension Maternal Grandfather   . Stroke Paternal Grandmother   . Hypertension Sister   . Diabetes Sister   . Hypertension Brother   . Alcohol abuse Maternal Aunt   . Cancer Maternal Aunt   . Colon cancer Neg Hx   . Stomach cancer Neg Hx   .  Esophageal cancer Neg Hx     Social History   Socioeconomic History  . Marital status: Married    Spouse name: [name]  . Number of children: 2  . Years of education: Not on file  . Highest education level: Not on file  Occupational History  . Occupation: Building surveyor: [employer]  Tobacco Use  . Smoking status: Never Smoker  . Smokeless tobacco: Never Used  Substance and Sexual Activity  . Alcohol use: No    Alcohol/week: 0.0 standard drinks  . Drug use: No  . Sexual activity: Yes    Birth control/protection: Post-menopausal    Comment: lives with husband, no dietary restrictions just watching carbs, wears seat belt  Other Topics Concern  . Not on file  Social History Narrative  . Not on file   Social Determinants of Health   Financial Resource Strain:   . Difficulty of Paying Living Expenses: Not on file  Food Insecurity:   . Worried About Charity fundraiser in the Last Year: Not on file  . Ran Out of Food in the Last Year: Not on file  Transportation Needs:   . Lack of Transportation (Medical): Not on file  . Lack of Transportation (Non-Medical): Not on file  Physical Activity:   . Days of Exercise per Week: Not on file  . Minutes of Exercise per Session: Not on file  Stress:   . Feeling of Stress : Not on file  Social Connections:   . Frequency of Communication with Friends and Family: Not on file  . Frequency of Social Gatherings with Friends and Family: Not on file  . Attends Religious Services: Not on file  . Active Member of Clubs or Organizations: Not on file  . Attends Archivist Meetings: Not on file  . Marital Status: Not on file  Intimate Partner Violence:   . Fear of Current or Ex-Partner: Not on file  . Emotionally Abused: Not on file  . Physically Abused: Not on file  . Sexually Abused: Not on file    Outpatient Medications Prior to Visit  Medication Sig Dispense Refill  . albuterol (VENTOLIN HFA) 108 (90 Base)  MCG/ACT inhaler Inhale 2 puffs into the lungs every 6 (six) hours as needed for wheezing or shortness of breath. 1 Inhaler 0  . ASPIR-LOW 81 MG EC tablet TAKE 1 TABLET BY MOUTH DAILY 30 tablet 3  . bisoprolol-hydrochlorothiazide (ZIAC) 5-6.25 MG tablet TAKE 1 TABLET BY MOUTH DAILY. 90 tablet 1  . canagliflozin (INVOKANA) 100 MG TABS tablet Take 1 tablet (100 mg  total) by mouth daily before breakfast. Patient stopped in January due to cost    . fluticasone (FLONASE) 50 MCG/ACT nasal spray Place 2 sprays into both nostrils daily. 16 g 6  . glucose blood (CONTOUR NEXT TEST) test strip USE TO CHECK BLOOD SUGAR DAILY AS NEEDED DX:E11.9 100 each 5  . metFORMIN (GLUCOPHAGE) 1000 MG tablet TAKE 1 TABLET BY MOUTH TWICE DAILY AND 1/2 TABLET AT NOON 225 tablet 1  . montelukast (SINGULAIR) 10 MG tablet Take 1 tablet (10 mg total) by mouth at bedtime as needed. 30 tablet 3  . omeprazole (PRILOSEC) 40 MG capsule TAKE 1 CAPSULE (40 MG TOTAL) BY MOUTH 2 (TWO) TIMES DAILY. 180 capsule 1  . valACYclovir (VALTREX) 1000 MG tablet Take 1 tablet (1,000 mg total) by mouth 3 (three) times daily. 21 tablet 0  . glimepiride (AMARYL) 2 MG tablet TAKE 1 TO 2 TABLETS BY MOUTH TWICE DAILY 120 tablet 1  . rosuvastatin (CRESTOR) 5 MG tablet Take 1 tablet (5 mg total) by mouth daily. 90 tablet 3   Facility-Administered Medications Prior to Visit  Medication Dose Route Frequency Provider Last Rate Last Admin  . Chlorhexidine Gluconate Cloth 2 % PADS 6 each  6 each Topical Once [name], MD       And  . Chlorhexidine Gluconate Cloth 2 % PADS 6 each  6 each Topical Once [name], MD        Allergies  Allergen Reactions  . Pravastatin Other (See Comments)    Myalgia, fatigue, memory loss  . Amoxicillin Itching    REACTION: rash  . Atorvastatin Other (See Comments)    Myalgia, fatigue, memory loss  . Codeine Nausea And Vomiting    Review of Systems  Constitutional: Negative for fever and malaise/fatigue.  HENT:  Positive for congestion.   Eyes: Negative for blurred vision.  Respiratory: Negative for shortness of breath.   Cardiovascular: Negative for chest pain, palpitations and leg swelling.  Gastrointestinal: Positive for abdominal pain. Negative for blood in stool and nausea.  Genitourinary: Negative for dysuria and frequency.  Musculoskeletal: Negative for falls.  Skin: Negative for rash.  Neurological: Negative for dizziness, loss of consciousness and headaches.  Endo/Heme/Allergies: Negative for environmental allergies.  Psychiatric/Behavioral: Negative for depression. The patient has insomnia. The patient is not nervous/anxious.        Objective:    Physical Exam unable to obtain via phone visit.   BP 133/84   Pulse 76   Wt 192 lb (87.1 kg)   BMI 34.01 kg/m  Wt Readings from Last 3 Encounters:  07/20/19 192 lb (87.1 kg)  04/17/19 190 lb 6.4 oz (86.4 kg)  08/14/18 184 lb 11.2 oz (83.8 kg)    Diabetic Foot Exam - Simple   No data filed     Lab Results  Component Value Date   WBC 5.4 03/05/2019   HGB 12.2 03/05/2019   HCT 37.7 03/05/2019   PLT 234.0 03/05/2019   GLUCOSE 296 (H) 03/05/2019   CHOL 142 03/05/2019   TRIG 201.0 (H) 03/05/2019   HDL 43.00 03/05/2019   LDLDIRECT 82.0 03/05/2019   LDLCALC 152 (H) 05/09/2018   ALT 38 (H) 03/05/2019   AST 27 03/05/2019   NA 138 03/05/2019   K 4.2 03/05/2019   CL 99 03/05/2019   CREATININE 0.86 03/05/2019   BUN 13 03/05/2019   CO2 30 03/05/2019   TSH 1.32 03/05/2019   HGBA1C 8.4 (H) 03/05/2019   MICROALBUR <0.7 12/04/2015  Lab Results  Component Value Date   TSH 1.32 03/05/2019   Lab Results  Component Value Date   WBC 5.4 03/05/2019   HGB 12.2 03/05/2019   HCT 37.7 03/05/2019   MCV 80.8 03/05/2019   PLT 234.0 03/05/2019   Lab Results  Component Value Date   NA 138 03/05/2019   K 4.2 03/05/2019   CO2 30 03/05/2019   GLUCOSE 296 (H) 03/05/2019   BUN 13 03/05/2019   CREATININE 0.86 03/05/2019   BILITOT  0.4 03/05/2019   ALKPHOS 71 03/05/2019   AST 27 03/05/2019   ALT 38 (H) 03/05/2019   PROT 7.0 03/05/2019   ALBUMIN 4.2 03/05/2019   CALCIUM 10.1 03/05/2019   ANIONGAP 11 04/26/2018   GFR 79.76 03/05/2019   Lab Results  Component Value Date   CHOL 142 03/05/2019   Lab Results  Component Value Date   HDL 43.00 03/05/2019   Lab Results  Component Value Date   LDLCALC 152 (H) 05/09/2018   Lab Results  Component Value Date   TRIG 201.0 (H) 03/05/2019   Lab Results  Component Value Date   CHOLHDL 3 03/05/2019   Lab Results  Component Value Date   HGBA1C 8.4 (H) 03/05/2019       Assessment & Plan:   Problem List Items Addressed This Visit    Type 2 diabetes mellitus with hyperglycemia, without long-term current use of insulin (HCC)    hgba1c unacceptable, minimize simple carbs. Increase exercise as tolerated.       Relevant Medications   rosuvastatin (CRESTOR) 5 MG tablet   glimepiride (AMARYL) 4 MG tablet   Other Relevant Orders   Hemoglobin A1c   Hyperlipidemia, mixed    Encouraged heart healthy diet, increase exercise, avoid trans fats, consider a krill oil cap daily      Relevant Medications   rosuvastatin (CRESTOR) 5 MG tablet   Other Relevant Orders   Lipid panel   Essential hypertension - Primary    Monitor and report any concerns.  no changes to meds. Encouraged heart healthy diet such as the DASH diet and exercise as tolerated.       Relevant Medications   rosuvastatin (CRESTOR) 5 MG tablet   Other Relevant Orders   Comprehensive metabolic panel   CBC with Differential/Platelet   TSH   Abdominal pain    Noting some suprapubic discomfort. Will check UA and culture and is given Macrobid to use if her symptoms worsen before results are available.       Insomnia    Encouraged good sleep hygiene such as dark, quiet room. No blue/green glowing lights such as computer screens in bedroom. No alcohol or stimulants in evening. Cut down on caffeine as  able. Regular exercise is helpful but not just prior to bed time.        Other Visit Diagnoses    Suprapubic pain       Relevant Orders   Urinalysis   Urine Culture      I have discontinued Merdith Seelman's glimepiride. I am also having her start on glimepiride and nitrofurantoin (macrocrystal-monohydrate). Additionally, I am having her maintain her Aspir-Low, glucose blood, canagliflozin, albuterol, fluticasone, montelukast, valACYclovir, omeprazole, metFORMIN, bisoprolol-hydrochlorothiazide, and rosuvastatin.  Meds ordered this encounter  Medications  . rosuvastatin (CRESTOR) 5 MG tablet    Sig: Take 1 tablet (5 mg total) by mouth daily.    Dispense:  90 tablet    Refill:  1  . glimepiride (AMARYL) 4 MG tablet  Sig: Take 1 tablet (4 mg total) by mouth daily before breakfast.    Dispense:  180 tablet    Refill:  1  . nitrofurantoin, macrocrystal-monohydrate, (MACROBID) 100 MG capsule    Sig: Take 1 capsule (100 mg total) by mouth 2 (two) times daily.    Dispense:  14 capsule    Refill:  0      I discussed the assessment and treatment plan with the patient. The patient was provided an opportunity to ask questions and all were answered. The patient agreed with the plan and demonstrated an understanding of the instructions.   The patient was advised to call back or seek an in-person evaluation if the symptoms worsen or if the condition fails to improve as anticipated.  I provided 25 minutes of non-face-to-face time during this encounter.   [name], MD

## 2019-07-23 NOTE — Assessment & Plan Note (Signed)
Noting some suprapubic discomfort. Will check UA and culture and is given Macrobid to use if her symptoms worsen before results are available.

## 2019-07-23 NOTE — Assessment & Plan Note (Signed)
Encouraged good sleep hygiene such as dark, quiet room. No blue/green glowing lights such as computer screens in bedroom. No alcohol or stimulants in evening. Cut down on caffeine as able. Regular exercise is helpful but not just prior to bed time.  

## 2019-08-02 ENCOUNTER — Other Ambulatory Visit: Payer: Medicare Other

## 2019-08-08 ENCOUNTER — Other Ambulatory Visit (INDEPENDENT_AMBULATORY_CARE_PROVIDER_SITE_OTHER): Payer: Medicare Other

## 2019-08-08 ENCOUNTER — Other Ambulatory Visit: Payer: Self-pay

## 2019-08-08 DIAGNOSIS — I10 Essential (primary) hypertension: Secondary | ICD-10-CM | POA: Diagnosis not present

## 2019-08-08 DIAGNOSIS — E782 Mixed hyperlipidemia: Secondary | ICD-10-CM

## 2019-08-08 DIAGNOSIS — R102 Pelvic and perineal pain: Secondary | ICD-10-CM

## 2019-08-08 DIAGNOSIS — E1165 Type 2 diabetes mellitus with hyperglycemia: Secondary | ICD-10-CM | POA: Diagnosis not present

## 2019-08-08 LAB — COMPREHENSIVE METABOLIC PANEL
ALT: 34 U/L (ref 0–35)
AST: 21 U/L (ref 0–37)
Albumin: 4.1 g/dL (ref 3.5–5.2)
Alkaline Phosphatase: 74 U/L (ref 39–117)
BUN: 14 mg/dL (ref 6–23)
CO2: 25 mEq/L (ref 19–32)
Calcium: 9.6 mg/dL (ref 8.4–10.5)
Chloride: 102 mEq/L (ref 96–112)
Creatinine, Ser: 0.97 mg/dL (ref 0.40–1.20)
GFR: 69.33 mL/min (ref >60.00)
Glucose, Bld: 182 mg/dL — ABNORMAL HIGH (ref 70–99)
Potassium: 4.1 mEq/L (ref 3.5–5.1)
Sodium: 139 mEq/L (ref 135–145)
Total Bilirubin: 0.3 mg/dL (ref 0.2–1.2)
Total Protein: 6.9 g/dL (ref 6.0–8.3)

## 2019-08-08 LAB — LIPID PANEL
Cholesterol: 178 mg/dL (ref 0–200)
HDL: 37.5 mg/dL — ABNORMAL LOW (ref >39.00)
LDL Cholesterol: 111 mg/dL — ABNORMAL HIGH (ref 0–99)
NonHDL: 140.35
Total CHOL/HDL Ratio: 5
Triglycerides: 145 mg/dL (ref 0.0–149.0)
VLDL: 29 mg/dL (ref 0.0–40.0)

## 2019-08-08 LAB — CBC WITH DIFFERENTIAL/PLATELET
Basophils Absolute: 0 10*3/uL (ref 0.0–0.1)
Basophils Relative: 0.6 % (ref 0.0–3.0)
Eosinophils Absolute: 0 10*3/uL (ref 0.0–0.7)
Eosinophils Relative: 1.2 % (ref 0.0–5.0)
HCT: 36.7 % (ref 36.0–46.0)
Hemoglobin: 12 g/dL (ref 12.0–15.0)
Lymphocytes Relative: 32.4 % (ref 12.0–46.0)
Lymphs Abs: 1.3 10*3/uL (ref 0.7–4.0)
MCHC: 32.7 g/dL (ref 30.0–36.0)
MCV: 80.8 fl (ref 78.0–100.0)
Monocytes Absolute: 0.3 10*3/uL (ref 0.1–1.0)
Monocytes Relative: 6.7 % (ref 3.0–12.0)
Neutro Abs: 2.5 10*3/uL (ref 1.4–7.7)
Neutrophils Relative %: 59.1 % (ref 43.0–77.0)
Platelets: 215 10*3/uL (ref 150.0–400.0)
RBC: 4.54 Mil/uL (ref 3.87–5.11)
RDW: 14.3 % (ref 11.5–15.5)
WBC: 4.2 10*3/uL (ref 4.0–10.5)

## 2019-08-08 LAB — URINALYSIS
Bilirubin Urine: NEGATIVE
Hgb urine dipstick: NEGATIVE
Ketones, ur: NEGATIVE
Leukocytes,Ua: NEGATIVE
Nitrite: NEGATIVE
Specific Gravity, Urine: 1.02 (ref 1.000–1.030)
Total Protein, Urine: NEGATIVE
Urine Glucose: NEGATIVE
Urobilinogen, UA: 0.2 (ref 0.0–1.0)
pH: 5.5 (ref 5.0–8.0)

## 2019-08-08 LAB — TSH: TSH: 1.32 u[IU]/mL (ref 0.35–4.50)

## 2019-08-09 ENCOUNTER — Encounter: Payer: Self-pay | Admitting: Family Medicine

## 2019-08-09 LAB — URINE CULTURE
MICRO NUMBER:: 10183428
Result:: NO GROWTH
SPECIMEN QUALITY:: ADEQUATE

## 2019-08-09 LAB — HEMOGLOBIN A1C: Hgb A1c MFr Bld: 8.8 % — ABNORMAL HIGH (ref 4.6–6.5)

## 2019-08-10 ENCOUNTER — Telehealth: Payer: Self-pay

## 2019-08-10 DIAGNOSIS — E1165 Type 2 diabetes mellitus with hyperglycemia: Secondary | ICD-10-CM

## 2019-08-10 NOTE — Telephone Encounter (Signed)
-----   Message from Mosie Lukes, MD sent at 08/09/2019 12:26 PM EST ----- Notify sugar has worsened. Would refer her to nutritionist if she would be willing. We also need to make some changes. We could refer to endocrinology for consultation. We could increase Amaryl to 4 mg twice a day or add a long acting insulin like Lantus 10 units sq daily and/or we could have a visit to discuss options. Please check with her and see what her preference is. thanks

## 2019-08-10 NOTE — Telephone Encounter (Signed)
LM with husband for pt to call the office to discuss/thx dmf

## 2019-08-10 NOTE — Telephone Encounter (Signed)
SB-I discussed this with the pt/she informed me that as of the 5th she is taking a total of 6 of her old Rx of 2mg  Glimepiride (12mg  total daily in divided doses)/she just picked up the new 4mg  tablets on the 5th that you prescribed  Question is - how much of this do you want her to take now that you know how much she has been taking since 2.5.21?  Also; she did not like the idea of the Lantus and said would have to discuss with you for a visit which we can schedule when we call her back/plz advise/thx dmf

## 2019-08-10 NOTE — Telephone Encounter (Signed)
I can add her on at the end of the day either Tuesday or Thursday virtually next week if she would like but endocrine is still a good idea if she is willing.

## 2019-08-10 NOTE — Telephone Encounter (Signed)
Good question. She should only be taking Glimepiride 4 mg twice a day. Max of 8 mg in 24 hours. She should not take 12 mg. She can choose not to take Lantus but her sugars are high enough to cause trouble like heart disease, kidney disease and more. If she is willing we could do a virtual visit in the next couple of weeks to discuss options or I can send her to endocrinology for consultation.

## 2019-08-10 NOTE — Telephone Encounter (Signed)
Patient would like to have endocrinology referral placed to better explain this process as pcp's schedule is booked for several weeks. She is requesting this issue be solved asap. I went over note with her and what she should be taking. Patient verbalized understanding. Placed referral to see Dr. Kelton Pillar at [facility]. Patient very happy with this option.

## 2019-08-10 NOTE — Telephone Encounter (Signed)
Princess, can  you see if this patient wants to see Dr. Charlett Blake virtually to discuss her diabetes medication. I put in the referral for endocrinology as the patient was ok with this. The patient however might want to see blyth prior to seeing them. If she does do you mind scheduling as I do not have override access?

## 2019-08-13 ENCOUNTER — Ambulatory Visit: Payer: Medicare Other | Attending: Internal Medicine

## 2019-08-13 DIAGNOSIS — Z23 Encounter for immunization: Secondary | ICD-10-CM | POA: Insufficient documentation

## 2019-08-13 NOTE — Telephone Encounter (Signed)
Thanks

## 2019-08-13 NOTE — Progress Notes (Signed)
   Covid-19 Vaccination Clinic  Name:  [name]    MRN: [id number] DOB: [birth year]  08/13/2019  [name] was observed post Covid-19 immunization for 15 minutes without incidence. She was provided with Vaccine Information Sheet and instruction to access the V-Safe system.   [name] was instructed to call 911 with any severe reactions post vaccine: Marland Kitchen Difficulty breathing  . Swelling of your face and throat  . A fast heartbeat  . A bad rash all over your body  . Dizziness and weakness    Immunizations Administered    Name Date Dose VIS Date Route   Pfizer COVID-19 Vaccine 08/13/2019 12:02 PM 0.3 mL 05/25/2019 Intramuscular   Manufacturer: [place]   Lot: [id number]   [place]: [id number]

## 2019-08-13 NOTE — Telephone Encounter (Signed)
FYI: SB-Pt is satisfied with just seeing Endocrinology as she is going to attempt to bring down her sugars between now and then to avoid having to go on insulin/will call if needs anything/thx dmf

## 2019-08-23 ENCOUNTER — Other Ambulatory Visit: Payer: Self-pay

## 2019-08-27 ENCOUNTER — Encounter: Payer: Self-pay | Admitting: Internal Medicine

## 2019-08-27 ENCOUNTER — Other Ambulatory Visit: Payer: Self-pay | Admitting: Internal Medicine

## 2019-08-27 ENCOUNTER — Ambulatory Visit (INDEPENDENT_AMBULATORY_CARE_PROVIDER_SITE_OTHER): Payer: Medicare Other | Admitting: Internal Medicine

## 2019-08-27 ENCOUNTER — Other Ambulatory Visit: Payer: Self-pay

## 2019-08-27 VITALS — BP 118/82 | HR 62 | Temp 98.3°F | Ht 63.0 in | Wt 193.4 lb

## 2019-08-27 DIAGNOSIS — E1165 Type 2 diabetes mellitus with hyperglycemia: Secondary | ICD-10-CM | POA: Diagnosis not present

## 2019-08-27 DIAGNOSIS — E785 Hyperlipidemia, unspecified: Secondary | ICD-10-CM | POA: Diagnosis not present

## 2019-08-27 LAB — GLUCOSE, POCT (MANUAL RESULT ENTRY): POC Glucose: 178 mg/dl — AB (ref 70–99)

## 2019-08-27 MED ORDER — GLIPIZIDE 5 MG PO TABS: 5.0000 mg | ORAL_TABLET | Freq: Two times a day (BID) | ORAL | 6 refills | Status: DC

## 2019-08-27 MED ORDER — METFORMIN HCL 1000 MG PO TABS: 1000.0000 mg | ORAL_TABLET | Freq: Two times a day (BID) | ORAL | 3 refills | Status: DC

## 2019-08-27 MED FILL — glipiZIDE 5 MG TABS: 5 | 30 days supply | Qty: 60 | Fill #0

## 2019-08-27 NOTE — Progress Notes (Signed)
Name: [name]  Age/ Sex: 69 y.o., female   MRN/ DOB: [id number], [birth year]     PCP: Mosie Lukes, MD   Reason for Endocrinology Evaluation: Type 2 Diabetes Mellitus  Initial Endocrine Consultative Visit: 02/22/2017    PATIENT IDENTIFIER: [name] is a 70 y.o. female with a past medical history of T2DM, HTN and Dsylipidemia. The patient has followed with Endocrinology clinic since 02/16/2019 for consultative assistance with management of her diabetes.  DIABETIC HISTORY:  Ms. Illa was diagnosed with T2DM in 2001. She has been on metformin, Glimepiride and Januvia. Invokana added in 2018. Her hemoglobin A1c has ranged from 7.0% in 2016, peaking at 8.8% in  2021.   She used to see Dr.Gherghe but was lost to follow up by 2019 , until her return to our clinic 08/2019.   SUBJECTIVE:   During the last visit (11/2017): Invokana added   Today (08/28/2019): [name]  She checks her blood sugars 4 times daily, preprandial. The patient has had hypoglycemic episodes since the last clinic visit, which typically occur  1 x / month- most often occuring in the afternoon. The patient is symptomatic with these episodes.  Otherwise, the patient has not required any recent emergency interventions for hypoglycemia and has not  had recent hospitalizations secondary to hyper or hypoglycemic episodes.   Lately has been getting UTI, and yeast infections   Eats 2-3 meals a day. Snacks 2x a day on chips and grapes.     ROS: As per HPI and as detailed below:   HOME DIABETES REGIMEN:  Metformin 1000 mg, takes 2.5 tabs  Glimepiride 4 mg daily  Invokana 100 mg- not taking cost prohibitive    Statin:  Crestor ACE-I/ARB: No Prior Diabetic Education: yes   METER DOWNLOAD SUMMARY: Did not bring     DIABETIC COMPLICATIONS: Microvascular complications:    Denies: CKD, retinopathy , neuropathy  Last Eye Exam: Completed 12/2018  Macrovascular complications:    Denies:  CAD, CVA, PVD   HISTORY:  Past Medical History:  Past Medical History:  Diagnosis Date  . Acute bronchitis 10/31/2015  . Allergy    seasonal  . Arthritis    knee-left  . Cancer ([employer]) 04/2018   right breast ca  . Cataract    right eye  . Diabetes mellitus 52   type 2  . Diaphragmatic hernia without mention of obstruction or gangrene   . Endometriosis   . Esophageal reflux   . Esophageal stricture   . GERD (gastroesophageal reflux disease)   . Hyperlipidemia   . Hyperplastic colon polyp   . Hypertension 52  . Nonalcoholic fatty liver disease 04/28/2010   Qualifier: Diagnosis of  By: Arnoldo Morale MD, Balinda Quails   . Nonspecific abnormal results of liver function study   . Personal history of radiation therapy 08/2018   bilateral breasts  . Pharyngitis 02/18/2012  . Plantar fascial fibromatosis   . Preventative health care 01/22/2013  . Seasonal allergies 10/31/2015  . Sleep apnea    has oral appliance but does not fit well due to missing teeth  . Sore throat 04/03/2017  . Tubular adenoma of colon   . Unspecified sleep apnea    no c-pap   Past Surgical History:  Past Surgical History:  Procedure Laterality Date  . ABDOMINAL HYSTERECTOMY  1982   partial  . APPENDECTOMY    . 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 BREAST BIOPSY Bilateral 04/03/2018  . BREAST  BIOPSY Bilateral 03/23/2018  . BREAST CYST INCISION AND DRAINAGE     right breast  . BREAST EXCISIONAL BIOPSY Right 2019  . BREAST LUMPECTOMY Bilateral 04/2018  . BREAST LUMPECTOMY WITH RADIOACTIVE SEED LOCALIZATION Bilateral 05/02/2018   Procedure: RIGHT BREAST SEED LUMPECTOMY X2 AND LEFT BREAST SEED GUIDED LUMPECTOMY;  Surgeon: [name], MD;  Location: [place];  Service: General;  Laterality: Bilateral;  . CATARACT EXTRACTION     right eye  . HERNIA REPAIR     per pt, she is not aware of this surgery  . RE-EXCISION OF BREAST LUMPECTOMY Right 05/25/2018   Procedure: RE-EXCISION OF RIGHT BREAST  LUMPECTOMY;  Surgeon: [name], MD;  Location: [facility];  Service: General;  Laterality: Right;  . seba    . TUBAL LIGATION      Social History:  reports that she has never smoked. She has never used smokeless tobacco. She reports that she does not drink alcohol or use drugs. Family History:  Family History  Problem Relation Age of Onset  . Breast cancer Mother   . Hypertension Mother   . Diabetes Mother        type 2  . Other Mother        esophagus surgery  . Heart disease Father   . Hyperlipidemia Father   . Hypertension Father   . Heart attack Father        MI at age 26  . Diabetes Brother        type 2  . Heart disease Maternal Grandmother   . Heart attack Maternal Grandfather   . Hypertension Maternal Grandfather   . Stroke Paternal Grandmother   . Hypertension Sister   . Diabetes Sister   . Hypertension Brother   . Alcohol abuse Maternal Aunt   . Cancer Maternal Aunt   . Colon cancer Neg Hx   . Stomach cancer Neg Hx   . Esophageal cancer Neg Hx      HOME MEDICATIONS: Allergies as of 08/27/2019      Reactions   Pravastatin Other (See Comments)   Myalgia, fatigue, memory loss   Amoxicillin Itching   REACTION: rash   Atorvastatin Other (See Comments)   Myalgia, fatigue, memory loss   Codeine Nausea And Vomiting      Medication List       Accurate as of August 27, 2019 11:59 PM. If you have any questions, ask your nurse or doctor.        STOP taking these medications   canagliflozin 100 MG Tabs tablet Commonly known as: Invokana Stopped by: Dorita Sciara, MD   glimepiride 4 MG tablet Commonly known as: AMARYL Stopped by: Dorita Sciara, MD     TAKE these medications   albuterol 108 (90 Base) MCG/ACT inhaler Commonly known as: VENTOLIN HFA Inhale 2 puffs into the lungs every 6 (six) hours as needed for wheezing or shortness of breath.   Aspir-Low 81 MG EC tablet Generic drug: aspirin TAKE 1 TABLET BY MOUTH  DAILY   bisoprolol-hydrochlorothiazide 5-6.25 MG tablet Commonly known as: ZIAC TAKE 1 TABLET BY MOUTH DAILY.   fluticasone 50 MCG/ACT nasal spray Commonly known as: FLONASE Place 2 sprays into both nostrils daily.   glipiZIDE 5 MG tablet Commonly known as: GLUCOTROL Take 1 tablet (5 mg total) by mouth 2 (two) times daily before a meal. Started by: Dorita Sciara, MD   glucose blood test strip Commonly known as: Contour Next Test  USE TO CHECK BLOOD SUGAR DAILY AS NEEDED DX:E11.9 What changed: additional instructions   metFORMIN 1000 MG tablet Commonly known as: GLUCOPHAGE Take 1 tablet (1,000 mg total) by mouth 2 (two) times daily with a meal. What changed: See the new instructions. Changed by: Dorita Sciara, MD   montelukast 10 MG tablet Commonly known as: SINGULAIR Take 1 tablet (10 mg total) by mouth at bedtime as needed.   nitrofurantoin (macrocrystal-monohydrate) 100 MG capsule Commonly known as: Macrobid Take 1 capsule (100 mg total) by mouth 2 (two) times daily.   omeprazole 40 MG capsule Commonly known as: PRILOSEC TAKE 1 CAPSULE (40 MG TOTAL) BY MOUTH 2 (TWO) TIMES DAILY.   rosuvastatin 5 MG tablet Commonly known as: Crestor Take 1 tablet (5 mg total) by mouth daily. What changed: additional instructions   valACYclovir 1000 MG tablet Commonly known as: VALTREX Take 1 tablet (1,000 mg total) by mouth 3 (three) times daily.        OBJECTIVE:   Vital Signs: BP 118/82 (BP Location: Left Arm, Patient Position: Sitting, Cuff Size: Normal)   Pulse 62   Temp 98.3 F (36.8 C)   Ht 5\' 3"  (1.6 m)   Wt 193 lb 6.4 oz (87.7 kg)   SpO2 98%   BMI 34.26 kg/m   Wt Readings from Last 3 Encounters:  08/27/19 193 lb 6.4 oz (87.7 kg)  07/20/19 192 lb (87.1 kg)  04/17/19 190 lb 6.4 oz (86.4 kg)     Exam: General: Pt appears well and is in NAD  HEENT:  Eyes: External eye exam normal without stare, lid lag or exophthalmos.  EOM intact.    Neck:  General: Supple without adenopathy. Thyroid: Thyroid size normal.  No goiter or nodules appreciated. No thyroid bruit.  Lungs: Clear with good BS bilat with no rales, rhonchi, or wheezes  Heart: RRR with normal S1 and S2 and no gallops; no murmurs; no rub  Abdomen: Normoactive bowel sounds, soft, nontender, without masses or organomegaly palpable  Extremities: No pretibial edema. No tremor.   Skin: Normal texture and temperature to palpation. No rash noted.  Neuro: MS is good with appropriate affect, pt is alert and Ox3    DATA REVIEWED:  Lab Results  Component Value Date   HGBA1C 8.8 (H) 08/08/2019   HGBA1C 8.4 (H) 03/05/2019   HGBA1C 8.0 (H) 11/29/2018   Lab Results  Component Value Date   MICROALBUR <0.7 12/04/2015   LDLCALC 111 (H) 08/08/2019   CREATININE 0.97 08/08/2019   Lab Results  Component Value Date   MICRALBCREAT 1.0 12/04/2015     Lab Results  Component Value Date   CHOL 178 08/08/2019   HDL 37.50 (L) 08/08/2019   LDLCALC 111 (H) 08/08/2019   LDLDIRECT 82.0 03/05/2019   TRIG 145.0 08/08/2019   CHOLHDL 5 08/08/2019         ASSESSMENT / PLAN / RECOMMENDATIONS:   1) Type 2 Diabetes Mellitus, Poorly controlled, [place] complications - Most recent A1c of 8.8 %. Goal A1c < 7.0 %.      - I have discussed with the patient the pathophysiology of diabetes. We went over the natural progression of the disease. We talked about both insulin resistance and insulin deficiency. We stressed the importance of lifestyle changes including diet and exercise. I explained the complications associated with diabetes including retinopathy, nephropathy, neuropathy as well as increased risk of cardiovascular disease. We went over the benefit seen with glycemic control.   - I explained to  the patient that diabetic patients are at higher than normal risk for amputations.  - Will stop Glimepiride at this time and replace with Glipizide, pt encouraged to contact us with  Persistent  hyperglycemia - We discussed the importance of avoiding sugar-sweetened beverages as well as snacks when possible, we discussed low carb options for snacks if necessary.      Plan: MEDICATIONS: - Stop Glimepiride  - Metformin 1000 mg , 1 tablet with breakfast and dinner - Start Glipizide 5 mg, 1 tablet before breakfast and dinner    EDUCATION / INSTRUCTIONS:  BG monitoring instructions: Patient is instructed to check her blood sugars 2 times a day, fasting and supper time.  Call [facility] if: BG persistently < 70 or > 300. . I reviewed the Rule of 15 for the treatment of hypoglycemia in detail with the patient. Literature supplied.    2) Diabetic complications:   Eye: Does not have known diabetic retinopathy.   Neuro/ Feet: Does not have known diabetic peripheral neuropathy .   Renal: Patient does not have known baseline CKD. She   is not on an ACEI/ARB at present.    3) Lipids: Patient is on crestor 5 mg, taking half a tablet , this is all she could tolerate at this time due to arthralgia. She is already on vitamin D. LDL improving but continues to be above goal. We discussed cardiovascular benefits of statins.     F/U in 3 months   Signed electronically by: Mack Guise, MD  [facility]  [facility] [address] [place], [place] [address] Phone: [phone] FAX: [phone]   CC: Mosie Lukes, [address] [place] [place] [address] Phone: [phone]  Fax: [phone]  Return to Endocrinology clinic as below: Future Appointments  Date Time Provider Adrian  09/11/2019 12:15 PM [place] [facility]  11/28/2019 11:10 AM Justiss Gerbino, Melanie Crazier, MD [facility] None  04/16/2020  1:30 PM Magrinat, Virgie Dad, MD [facility] None

## 2019-08-27 NOTE — Patient Instructions (Addendum)
-   Stop Glimepiride  - Metformin 1000 mg , 1 tablet with breakfast and dinner - Start Glipizide 5 mg, 1 tablet before breakfast and dinner    Choose healthy, lower carb lower calorie snacks: toss salad, vegetables, cottage cheese, peanut butter, low fat cheese / string cheese,tuna salad or chicken salad  - Check sugar before breakfast and before dinner   - If your sugars are consistently over 180 mg/dL , please contact us      HOW TO TREAT LOW BLOOD SUGARS (Blood sugar LESS THAN 70 MG/DL)  Please follow the RULE OF 15 for the treatment of hypoglycemia treatment (when your (blood sugars are less than 70 mg/dL)    STEP 1: Take 15 grams of carbohydrates when your blood sugar is low, which includes:   3-4 GLUCOSE TABS  OR  3-4 OZ OF JUICE OR REGULAR SODA OR  ONE TUBE OF GLUCOSE GEL     STEP 2: RECHECK blood sugar in 15 MINUTES STEP 3: If your blood sugar is still low at the 15 minute recheck --> then, go back to STEP 1 and treat AGAIN with another 15 grams of carbohydrates.

## 2019-08-28 ENCOUNTER — Encounter: Payer: Self-pay | Admitting: Internal Medicine

## 2019-09-11 ENCOUNTER — Ambulatory Visit: Payer: Medicare Other | Attending: Internal Medicine

## 2019-09-11 DIAGNOSIS — Z23 Encounter for immunization: Secondary | ICD-10-CM

## 2019-09-11 NOTE — Progress Notes (Signed)
   Covid-19 Vaccination Clinic  Name:  [name]    MRN: [id number] DOB: 1950/08/27  09/11/2019  [name] was observed post Covid-19 immunization for 15 minutes without incident. She was provided with Vaccine Information Sheet and instruction to access the V-Safe system.   [name] was instructed to call 911 with any severe reactions post vaccine: Marland Kitchen Difficulty breathing  . Swelling of face and throat  . A fast heartbeat  . A bad rash all over body  . Dizziness and weakness   Immunizations Administered    Name Date Dose VIS Date Route   Pfizer COVID-19 Vaccine 09/11/2019 12:09 PM 0.3 mL 05/25/2019 Intramuscular   Manufacturer: [place]   Lot: [id number]   [place]: [id number]

## 2019-09-21 ENCOUNTER — Other Ambulatory Visit: Payer: Self-pay

## 2019-09-21 MED ORDER — GLIPIZIDE 5 MG PO TABS: 5.0000 mg | ORAL_TABLET | Freq: Two times a day (BID) | ORAL | 1 refills | Status: DC

## 2019-09-21 MED FILL — glipiZIDE 5 MG TABS: 5 | 90 days supply | Qty: 180 | Fill #0

## 2019-10-12 ENCOUNTER — Other Ambulatory Visit: Payer: Self-pay | Admitting: Family Medicine

## 2019-10-12 MED FILL — OMEPRAZOLE 40 MG CPDR: 40 | 90 days supply | Qty: 180 | Fill #0

## 2019-10-12 MED FILL — BISOPROLOL-HCTZ 5-6.25 MG T: 5-6.25 | 90 days supply | Qty: 90 | Fill #1

## 2019-10-12 MED FILL — METFORMIN HCL 1000 MG TABS: 1000 | 90 days supply | Qty: 225 | Fill #1

## 2019-10-30 ENCOUNTER — Encounter: Payer: Self-pay | Admitting: Family Medicine

## 2019-10-31 ENCOUNTER — Other Ambulatory Visit: Payer: Self-pay | Admitting: Family Medicine

## 2019-10-31 MED ORDER — CONTOUR NEXT TEST VI STRP: ORAL_STRIP | 5 refills | Status: DC

## 2019-11-01 ENCOUNTER — Telehealth: Payer: Self-pay | Admitting: Family Medicine

## 2019-11-01 NOTE — Telephone Encounter (Signed)
Patient was able to change appt with Endo [place] in June .

## 2019-11-02 MED ORDER — FREESTYLE LITE DEVI: 0 refills | Status: DC

## 2019-11-02 NOTE — Telephone Encounter (Signed)
Sent rx for freestyle lite meter and test strips per pharmacy request to replace Contour Next supplies.

## 2019-11-02 NOTE — Telephone Encounter (Signed)
glucose blood (FREESTYLE LITE) test strip        Changed from: glucose blood (CONTOUR NEXT TEST) test strip   Sig: N/A   Disp:  100 each  Refills:  0   Start: 11/01/2019   Class: Normal   Non-formulary   Last ordered: 2 days ago by Mosie Lukes, MD Last refill: 10/31/2019   Rx #: [id number]   Pharmacy comment: Alternative Requested:NEEDS NEW SCRIPT FOR FREESTYLE LITE ALSO FOR METER.     To be filled at: [id number] - [address]

## 2019-11-28 ENCOUNTER — Ambulatory Visit: Payer: Medicare Other | Admitting: Internal Medicine

## 2019-12-03 ENCOUNTER — Ambulatory Visit: Payer: Medicare Other | Admitting: Internal Medicine

## 2019-12-05 ENCOUNTER — Other Ambulatory Visit: Payer: Self-pay | Admitting: Internal Medicine

## 2019-12-05 ENCOUNTER — Encounter: Payer: Self-pay | Admitting: Internal Medicine

## 2019-12-05 ENCOUNTER — Other Ambulatory Visit: Payer: Self-pay

## 2019-12-05 ENCOUNTER — Ambulatory Visit (INDEPENDENT_AMBULATORY_CARE_PROVIDER_SITE_OTHER): Payer: HMO | Admitting: Internal Medicine

## 2019-12-05 VITALS — BP 118/64 | HR 78 | Ht 63.0 in | Wt 190.2 lb

## 2019-12-05 DIAGNOSIS — E1165 Type 2 diabetes mellitus with hyperglycemia: Secondary | ICD-10-CM | POA: Diagnosis not present

## 2019-12-05 DIAGNOSIS — Q678 Other congenital deformities of chest: Secondary | ICD-10-CM

## 2019-12-05 DIAGNOSIS — E785 Hyperlipidemia, unspecified: Secondary | ICD-10-CM | POA: Diagnosis not present

## 2019-12-05 LAB — POCT GLYCOSYLATED HEMOGLOBIN (HGB A1C): Hemoglobin A1C: 8 % — AB (ref 4.0–5.6)

## 2019-12-05 MED ORDER — FREESTYLE LITE TEST VI STRP: ORAL_STRIP | 11 refills | Status: DC

## 2019-12-05 MED ORDER — GLIPIZIDE 5 MG PO TABS: ORAL_TABLET | ORAL | 3 refills | Status: DC

## 2019-12-05 MED FILL — glipiZIDE 5 MG TABS: 5 | 90 days supply | Qty: 225 | Fill #0

## 2019-12-05 NOTE — Patient Instructions (Signed)
-   Continue Metformin 1000 mg , 1 tablet with breakfast and 1 tablet with dinner - Increase  Glipizide 5 mg, 1 tablet before breakfast and 1.5 tablets before  dinner        HOW TO TREAT LOW BLOOD SUGARS (Blood sugar LESS THAN 70 MG/DL)  Please follow the RULE OF 15 for the treatment of hypoglycemia treatment (when your (blood sugars are less than 70 mg/dL)    STEP 1: Take 15 grams of carbohydrates when your blood sugar is low, which includes:   3-4 GLUCOSE TABS  OR  3-4 OZ OF JUICE OR REGULAR SODA OR  ONE TUBE OF GLUCOSE GEL     STEP 2: RECHECK blood sugar in 15 MINUTES STEP 3: If your blood sugar is still low at the 15 minute recheck --> then, go back to STEP 1 and treat AGAIN with another 15 grams of carbohydrates.

## 2019-12-05 NOTE — Progress Notes (Signed)
Name: [name]  Age/ Sex: 69 y.o., female   MRN/ DOB: [id number], 04-Dec-1950     PCP: Mosie Lukes, MD   Reason for Endocrinology Evaluation: Type 2 Diabetes Mellitus  Initial Endocrine Consultative Visit: 02/22/2017    PATIENT IDENTIFIER: [name] is a 69 y.o. female with a past medical history of T2DM, HTN , Dyslipidemia, Hx of breast Ca ( S/P right lumpectomy) . The patient has followed with Endocrinology clinic since 02/16/2019 for consultative assistance with management of her diabetes.  DIABETIC HISTORY:  [name] was diagnosed with T2DM in 2001. She has been on metformin, Glimepiride and Januvia. Invokana added in 2018 but was cost prohibitive. Her hemoglobin A1c has ranged from 7.0% in 2016, peaking at 8.8% in  2021.    She used to see Dr.Gherghe but was lost to follow up by 2019 , until her return to our clinic 08/2019.   SUBJECTIVE:   During the last visit (08/27/2019):  A1c 8.8%.  Switched Glimepiride to Glipizide and continued Metformin   Today (12/05/2019): Ms. [name] is here for a follow up on diabetes management.  She checks her blood sugars 2 times daily The patient has had hypoglycemic episodes since the last clinic visit, which typically occur during the day (68 mg/dL ) , this was noted on her meter but the pt does not recall that.     Pt has noted a cord-like thickening on the right chest area (above the right breast) this was noted a week ago, no pain . She has hx of breast ca in the past, she has an upcoming mammogram next month.    HOME DIABETES REGIMEN:  Metformin 1000 mg, BID Glipizide 5 mg BID    Statin:  Crestor ACE-I/ARB: No Prior Diabetic Education: yes   METER DOWNLOAD SUMMARY: unable to download BG 68- 230 mg/dL    DIABETIC COMPLICATIONS: Microvascular complications:    Denies: CKD, retinopathy , neuropathy  Last Eye Exam: Completed 12/2018  Macrovascular complications:    Denies: CAD, CVA, PVD   HISTORY:   Past Medical History:  Past Medical History:  Diagnosis Date   Acute bronchitis 10/31/2015   Allergy    seasonal   Arthritis    knee-left   Cancer ([place]) 04/2018   right breast ca   Cataract    right eye   Diabetes mellitus 52   type 2   Diaphragmatic hernia without mention of obstruction or gangrene    Endometriosis    Esophageal reflux    Esophageal stricture    GERD (gastroesophageal reflux disease)    Hyperlipidemia    Hyperplastic colon polyp    Hypertension 52   Nonalcoholic fatty liver disease 04/28/2010   Qualifier: Diagnosis of  By: Arnoldo Morale MD, John E    Nonspecific abnormal results of liver function study    Personal history of radiation therapy 08/2018   bilateral breasts   Pharyngitis 02/18/2012   Plantar fascial fibromatosis    Preventative health care 01/22/2013   Seasonal allergies 10/31/2015   Sleep apnea    has oral appliance but does not fit well due to missing teeth   Sore throat 04/03/2017   Tubular adenoma of colon    Unspecified sleep apnea    no c-pap   Past Surgical History:  Past Surgical History:  Procedure Laterality Date   ABDOMINAL HYSTERECTOMY  1982   partial   APPENDECTOMY     benigh cyst in lung  2006  right   BREAST BIOPSY Bilateral 04/03/2018   BREAST BIOPSY Bilateral 03/23/2018   BREAST CYST INCISION AND DRAINAGE     right breast   BREAST EXCISIONAL BIOPSY Right 2019   BREAST LUMPECTOMY Bilateral 04/2018   BREAST LUMPECTOMY WITH RADIOACTIVE SEED LOCALIZATION Bilateral 05/02/2018   Procedure: RIGHT BREAST SEED LUMPECTOMY X2 AND LEFT BREAST SEED GUIDED LUMPECTOMY;  Surgeon: [name], MD;  Location: [facility];  Service: General;  Laterality: Bilateral;   CATARACT EXTRACTION     right eye   HERNIA REPAIR     per pt, she is not aware of this surgery   RE-EXCISION OF BREAST LUMPECTOMY Right 05/25/2018   Procedure: RE-EXCISION OF RIGHT BREAST LUMPECTOMY;  Surgeon: [name], MD;  Location: [facility];  Service: General;  Laterality: Right;   seba     TUBAL LIGATION      Social History:  reports that she has never smoked. She has never used smokeless tobacco. She reports that she does not drink alcohol and does not use drugs. Family History:  Family History  Problem Relation Age of Onset   Breast cancer Mother    Hypertension Mother    Diabetes Mother        type 2   Other Mother        esophagus surgery   Heart disease Father    Hyperlipidemia Father    Hypertension Father    Heart attack Father        MI at age 65   Diabetes Brother        type 2   Heart disease Maternal Grandmother    Heart attack Maternal Grandfather    Hypertension Maternal Grandfather    Stroke Paternal Grandmother    Hypertension Sister    Diabetes Sister    Hypertension Brother    Alcohol abuse Maternal Aunt    Cancer Maternal Aunt    Colon cancer Neg Hx    Stomach cancer Neg Hx    Esophageal cancer Neg Hx      HOME MEDICATIONS: Allergies as of 12/05/2019      Reactions   Pravastatin Other (See Comments)   Myalgia, fatigue, memory loss   Amoxicillin Itching   REACTION: rash   Atorvastatin Other (See Comments)   Myalgia, fatigue, memory loss   Codeine Nausea And Vomiting      Medication List       Accurate as of December 05, 2019 10:35 AM. If you have any questions, ask your nurse or doctor.        albuterol 108 (90 Base) MCG/ACT inhaler Commonly known as: VENTOLIN HFA Inhale 2 puffs into the lungs every 6 (six) hours as needed for wheezing or shortness of breath.   Aspir-Low 81 MG EC tablet Generic drug: aspirin TAKE 1 TABLET BY MOUTH DAILY   bisoprolol-hydrochlorothiazide 5-6.25 MG tablet Commonly known as: ZIAC TAKE 1 TABLET BY MOUTH DAILY.   fluticasone 50 MCG/ACT nasal spray Commonly known as: FLONASE Place 2 sprays into both nostrils daily.   FreeStyle Lite Devi Please dispense Freestyle Lite  Glucometer.  DX  E11.9   FREESTYLE LITE test strip Generic drug: glucose blood Check blood sugar once daily as needed.  DX E11.9   glipiZIDE 5 MG tablet Commonly known as: GLUCOTROL Take 1 tablet (5 mg total) by mouth 2 (two) times daily before a meal.   metFORMIN 1000 MG tablet Commonly known as: GLUCOPHAGE Take 1 tablet (1,000 mg total) by mouth  2 (two) times daily with a meal.   montelukast 10 MG tablet Commonly known as: SINGULAIR Take 1 tablet (10 mg total) by mouth at bedtime as needed.   nitrofurantoin (macrocrystal-monohydrate) 100 MG capsule Commonly known as: Macrobid Take 1 capsule (100 mg total) by mouth 2 (two) times daily.   omeprazole 40 MG capsule Commonly known as: PRILOSEC TAKE 1 CAPSULE (40 MG TOTAL) BY MOUTH 2 (TWO) TIMES DAILY.   rosuvastatin 5 MG tablet Commonly known as: Crestor Take 1 tablet (5 mg total) by mouth daily. What changed: additional instructions   valACYclovir 1000 MG tablet Commonly known as: VALTREX Take 1 tablet (1,000 mg total) by mouth 3 (three) times daily.        OBJECTIVE:   Vital Signs: BP 118/64 (BP Location: Right Arm, Patient Position: Sitting, Cuff Size: Normal)    Pulse 78    Ht 5\' 3"  (1.6 m)    Wt 190 lb 3.2 oz (86.3 kg)    SpO2 98%    BMI 33.69 kg/m   Wt Readings from Last 3 Encounters:  12/05/19 190 lb 3.2 oz (86.3 kg)  08/27/19 193 lb 6.4 oz (87.7 kg)  07/20/19 192 lb (87.1 kg)     Exam: General: Pt appears well and is in NAD  Lungs: Clear with good BS bilat with no rales, rhonchi, or wheezes  Heart: RRR with normal S1 and S2 and no gallops; no murmurs; no rub  Skin: Cord-like thickening ~ 2.5 cm in length just above the right breast  Abdomen: Normoactive bowel sounds, soft, nontender, without masses or organomegaly palpable  Extremities: No pretibial edema.   Neuro: MS is good with appropriate affect, pt is alert and Ox3     DM foot exam 12/05/2019  The skin of the feet is intact without sores or  ulcerations. The pedal pulses are 2+ on right and 2+ on left. The sensation is intact to a screening 5.07, 10 gram monofilament bilaterally  DATA REVIEWED:  Lab Results  Component Value Date   HGBA1C 8.0 (A) 12/05/2019   HGBA1C 8.8 (H) 08/08/2019   HGBA1C 8.4 (H) 03/05/2019   Lab Results  Component Value Date   MICROALBUR <0.7 12/04/2015   LDLCALC 111 (H) 08/08/2019   CREATININE 0.97 08/08/2019   Lab Results  Component Value Date   MICRALBCREAT 1.0 12/04/2015     Lab Results  Component Value Date   CHOL 178 08/08/2019   HDL 37.50 (L) 08/08/2019   LDLCALC 111 (H) 08/08/2019   LDLDIRECT 82.0 03/05/2019   TRIG 145.0 08/08/2019   CHOLHDL 5 08/08/2019         ASSESSMENT / PLAN / RECOMMENDATIONS:   1) Type 2 Diabetes Mellitus, Suboptimally controlled, [place] complications - Most recent A1c of 8.0 %. Goal A1c < 7.0 %.      - A1c down from 8.8%  - In review of her glucose meter, her Bg's appear higher at night rather than during the day, I am going to increase her evening dose of glipizide.   MEDICATIONS:  - Continue Metformin 1000 mg , 1 tablet with breakfast and dinner - Increase  Glipizide 5 mg, 1 tablet before breakfast and 1.5 tablets before dinner    EDUCATION / INSTRUCTIONS:  BG monitoring instructions: Patient is instructed to check her blood sugars 2 times a day, fasting and supper time.  Call [facility] if: BG persistently < 70 or > 300.  I reviewed the Rule of 15 for the treatment of  hypoglycemia in detail with the patient. Literature supplied.    2) Diabetic complications:   Eye: Does not have known diabetic retinopathy.   Neuro/ Feet: Does not have known diabetic peripheral neuropathy .   Renal: Patient does not have known baseline CKD. She   is not on an ACEI/ARB at present.    3) Dyslipidemia: Patient is on crestor 5 mg, taking half a tablet , this is all she could tolerate at this time due to arthralgia. She is already on  vitamin D. If this remains elevated will consider adding Zetia   4) Chest wall Abnormality :  - Cord-like thickening just above the right breast, we discussed this could be a scar tissue vs an engorged lymphatic or vessel. She will reschedule her breast imaging sooner, she was advised to follow up with oncology if breast imaging were not revealing, as her follow up is not until next November.  - Pt expressed understanding       F/U in 4 months   Signed electronically by: Mack Guise, MD  [facility] End[facility]in[facility]  [address] [place], [place] [address] Phone: [phone] FAX: [phone]   CC: Mosie Lukes, MD [address] [place] [address] Phone: [phone]  Fax: [phone]  Return to Endocrinology clinic as below: Future Appointments  Date Time Provider [place]  04/16/2020  1:30 PM Magrinat, Virgie Dad, MD [facility] None

## 2019-12-06 ENCOUNTER — Telehealth: Payer: Self-pay

## 2019-12-06 ENCOUNTER — Telehealth: Payer: Self-pay | Admitting: Adult Health

## 2019-12-06 NOTE — Telephone Encounter (Signed)
Pt called and LVM stating she has a "clogged duct in chest" per endocrinologist. Pt states endocrinologist advised pt to call us to set up appt. Wilber Bihari, NP was made aware and read note from endocrinologist. Returned call to pt, she denies pain/swelling/redness and just states it's "worrisome". Message was sent to scheduling to get pt on schedule to see Wilber Bihari, NP ASAP. Pt understands she should be receiving a call from scheduler. Advised pt to call back if symptoms worsen. Verbalized thanks and understanding.

## 2019-12-06 NOTE — Telephone Encounter (Signed)
Scheduled appt per 6/24 sch message - patient is aware of appt date and time

## 2019-12-14 ENCOUNTER — Inpatient Hospital Stay: Payer: HMO | Attending: Adult Health | Admitting: Adult Health

## 2019-12-14 ENCOUNTER — Encounter: Payer: Self-pay | Admitting: Adult Health

## 2019-12-14 ENCOUNTER — Other Ambulatory Visit: Payer: Self-pay

## 2019-12-14 VITALS — BP 134/71 | HR 74 | Temp 98.5°F | Resp 18 | Ht 63.0 in | Wt 188.1 lb

## 2019-12-14 DIAGNOSIS — I10 Essential (primary) hypertension: Secondary | ICD-10-CM | POA: Insufficient documentation

## 2019-12-14 DIAGNOSIS — Z171 Estrogen receptor negative status [ER-]: Secondary | ICD-10-CM | POA: Diagnosis not present

## 2019-12-14 DIAGNOSIS — D0511 Intraductal carcinoma in situ of right breast: Secondary | ICD-10-CM | POA: Diagnosis not present

## 2019-12-14 DIAGNOSIS — Z79899 Other long term (current) drug therapy: Secondary | ICD-10-CM | POA: Diagnosis not present

## 2019-12-14 DIAGNOSIS — D0512 Intraductal carcinoma in situ of left breast: Secondary | ICD-10-CM | POA: Diagnosis not present

## 2019-12-14 DIAGNOSIS — E119 Type 2 diabetes mellitus without complications: Secondary | ICD-10-CM | POA: Diagnosis not present

## 2019-12-14 NOTE — Progress Notes (Signed)
[place]  [phone] Fax:(336) [phone]    ID: [name] DOB: 01-22-1951  MR#: [id number]  YQI#:[id number]  Patient Care Team: Christine Lukes, MD as PCP - General (Family Medicine) Christine Schmidt, MD as Consulting Physician (Ophthalmology) Christine Sinner Colin Benton, MD as Consulting Physician (Obstetrics and Gynecology) Christine Klein, MD as Consulting Physician (General Surgery) [name], Christine Dad, MD as Consulting Physician (Oncology) Christine Kingdom, MD as Consulting Physician (Internal Medicine) Christine Gentry, MD as Consulting Physician (Sports Medicine) Christine Karvonen, RN as [place] Management OTHER MD:   CHIEF COMPLAINT: Estrogen receptor negative ductal carcinoma in situ  CURRENT TREATMENT: Observation   INTERVAL HISTORY: [name] returns today for follow-up of her estrogen receptor negative ductal carcinoma in situ.   She is up to date with her mammograms, most recently completed in 02/2020 at [facility].  She is due for a repeat in 02/2020.    She was referred back to see [place] by her endocrinologist for a "clogged duct" in her right breast.  [name] tells me that her duct issue has since resolved.  She denies any other breast concerns.    REVIEW OF SYSTEMS: [name] is exercising and working on her diet for her diabetes.  She notes that she is taking her diabetes medications as well.  She has improved her hemoglobin a 1 c from 8.7 to 7.7.  She is limiting her carbohydrates and is also focusing on breakfast being her biggest meal of the day.  She is down 5 pounds since her last appointment with [place].    [name] denies any fever, chills, chest pain, palpitations, cough, bowel/bladder changes, headaches, shortness of breath, nausea, vomiting, or any other concerns.  A detailed ROS was otherwise non contributory.    HISTORY OF CURRENT ILLNESS: From the original intake note:  [name] had routine screening mammography  on 02/20/2018 at [facility] showing a possible abnormality in the right breast. She underwent unilateral right diagnostic mammography with tomography and right breast ultrasonography at [facility] on 03/10/2018 showing: breast density category C. There is a cluster of heterogeneous calcifications in the right breast upper central quadrant. There was no sonographic abnormalities or lymphadenopathy in the right axilla.  Accordingly on 03/07/2018 she proceeded to biopsy of the right breast area in question. The pathology from this procedure showed ([id number]): Ductal carcinoma in situ, high grade. There are foci suspicious for early stromal invasion. Prognostic indicators significant for: both estrogen receptor and progesterone receptor, 0% negative.  The patient's subsequent history is as detailed below.   PAST MEDICAL HISTORY: Past Medical History:  Diagnosis Date  . Acute bronchitis 10/31/2015  . Allergy    seasonal  . Arthritis    knee-left  . Cancer ([place]) 04/2018   right breast ca  . Cataract    right eye  . Diabetes mellitus 52   type 2  . Diaphragmatic hernia without mention of obstruction or gangrene   . Endometriosis   . Esophageal reflux   . Esophageal stricture   . GERD (gastroesophageal reflux disease)   . Hyperlipidemia   . Hyperplastic colon polyp   . Hypertension 52  . Nonalcoholic fatty liver disease 04/28/2010   Qualifier: Diagnosis of  By: Arnoldo Morale MD, Balinda Quails   . Nonspecific abnormal results of liver function study   . Personal history of radiation therapy 08/2018   bilateral breasts  . Pharyngitis 02/18/2012  . Plantar fascial fibromatosis   . Preventative health care 01/22/2013  . Seasonal allergies  10/31/2015  . Sleep apnea    has oral appliance but does not fit well due to missing teeth  . Sore throat 04/03/2017  . Tubular adenoma of colon   . Unspecified sleep apnea    no c-pap    PAST SURGICAL HISTORY: Past Surgical History:  Procedure Laterality Date  .  ABDOMINAL HYSTERECTOMY  1982   partial  . APPENDECTOMY    . 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 BREAST BIOPSY Bilateral 04/03/2018  . BREAST BIOPSY Bilateral 03/23/2018  . BREAST CYST INCISION AND DRAINAGE     right breast  . BREAST EXCISIONAL BIOPSY Right 2019  . BREAST LUMPECTOMY Bilateral 04/2018  . BREAST LUMPECTOMY WITH RADIOACTIVE SEED LOCALIZATION Bilateral 05/02/2018   Procedure: RIGHT BREAST SEED LUMPECTOMY X2 AND LEFT BREAST SEED GUIDED LUMPECTOMY;  Surgeon: Christine Klein, MD;  Location: [place];  Service: General;  Laterality: Bilateral;  . CATARACT EXTRACTION     right eye  . HERNIA REPAIR     per pt, she is not aware of this surgery  . RE-EXCISION OF BREAST LUMPECTOMY Right 05/25/2018   Procedure: RE-EXCISION OF RIGHT BREAST LUMPECTOMY;  Surgeon: Christine Klein, MD;  Location: [place];  Service: General;  Laterality: Right;  . seba    . TUBAL LIGATION    Partial hysterectomy   FAMILY HISTORY: Family History  Problem Relation Age of Onset  . Breast cancer Mother   . Hypertension Mother   . Diabetes Mother        type 2  . Other Mother        esophagus surgery  . Heart disease Father   . Hyperlipidemia Father   . Hypertension Father   . Heart attack Father        MI at age 25  . Diabetes Brother        type 2  . Heart disease Maternal Grandmother   . Heart attack Maternal Grandfather   . Hypertension Maternal Grandfather   . Stroke Paternal Grandmother   . Hypertension Sister   . Diabetes Sister   . Hypertension Brother   . Alcohol abuse Maternal Aunt   . Cancer Maternal Aunt   . Colon cancer Neg Hx   . Stomach cancer Neg Hx   . Esophageal cancer Neg Hx    The patient's father died at age 18 due to CHF. The patient's mother died at age 55 due to breast cancer diagnosed at age 72. The patient had 3 brothers and 3 sisters. There was a paternal aunt with breast cancer diagnosed in the 70's. There was also a maternal aunt  with ovarian cancer that passed away. The patient notes that she had prior genetics testing in 2018 for ovarian and breast under Dr. Royston Sinner with normal results.    GYNECOLOGIC HISTORY:  No LMP recorded. Patient has had a hysterectomy. Menarche: 69 years old Age at first live birth: 69 years old She is [name].  She is status post partial hysterectomy without BSO. Her LMP: was in 70 ( late 30's). She took hormone replacement for 3 years. She also took oral contraception for 3 years with no complications.    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 Her husband, Christine Reading "Dexter" works in transportation and also worked as a Dealer. The patient's daughter, [name] lives in [place] and is a Hotel manager. The patient's daughter, [name] lives in [place] and works as a  claims specialist. The patient has 2 grandchildren and 2 great grandchildren.   ADVANCED DIRECTIVES:    HEALTH MAINTENANCE: Social History   Tobacco Use  . Smoking status: Never Smoker  . Smokeless tobacco: Never Used  Substance Use Topics  . Alcohol use: No    Alcohol/week: 0.0 standard drinks  . Drug use: No     Colonoscopy: 11/18/2015/ Dr. Fuller Plan  PAP: 03/2017  Bone density: 2018   Allergies  Allergen Reactions  . Pravastatin Other (See Comments)    Myalgia, fatigue, memory loss  . Amoxicillin Itching    REACTION: rash  . Atorvastatin Other (See Comments)    Myalgia, fatigue, memory loss  . Codeine Nausea And Vomiting    Current Outpatient Medications  Medication Sig Dispense Refill  . albuterol (VENTOLIN HFA) 108 (90 Base) MCG/ACT inhaler Inhale 2 puffs into the lungs every 6 (six) hours as needed for wheezing or shortness of breath. 1 Inhaler 0  . ASPIR-LOW 81 MG EC tablet TAKE 1 TABLET BY MOUTH DAILY 30 tablet 3  . bisoprolol-hydrochlorothiazide (ZIAC) 5-6.25 MG tablet TAKE 1 TABLET BY MOUTH DAILY. 90 tablet 1  . Blood Glucose Monitoring Suppl (FREESTYLE LITE)  DEVI Please dispense Freestyle Lite Glucometer.  DX  E11.9 1 each 0  . fluticasone (FLONASE) 50 MCG/ACT nasal spray Place 2 sprays into both nostrils daily. 16 g 6  . glipiZIDE (GLUCOTROL) 5 MG tablet Take 1 tablet (5 mg total) by mouth daily before breakfast AND 1.5 tablets (7.5 mg total) daily before supper. 225 tablet 3  . glucose blood (FREESTYLE LITE) test strip Check blood sugar once daily as needed.  DX E11.9 100 each 11  . metFORMIN (GLUCOPHAGE) 1000 MG tablet Take 1 tablet (1,000 mg total) by mouth 2 (two) times daily with a meal. 180 tablet 3  . montelukast (SINGULAIR) 10 MG tablet Take 1 tablet (10 mg total) by mouth at bedtime as needed. 30 tablet 3  . omeprazole (PRILOSEC) 40 MG capsule TAKE 1 CAPSULE (40 MG TOTAL) BY MOUTH 2 (TWO) TIMES DAILY. 180 capsule 1  . rosuvastatin (CRESTOR) 5 MG tablet Take 1 tablet (5 mg total) by mouth daily. (Patient taking differently: Take 5 mg by mouth daily. 1/2 tab daily) 90 tablet 1   No current facility-administered medications for this visit.   Facility-Administered Medications Ordered in Other Visits  Medication Dose Route Frequency Provider Last Rate Last Admin  . Chlorhexidine Gluconate Cloth 2 % PADS 6 each  6 each Topical Once Christine Klein, MD       And  . Chlorhexidine Gluconate Cloth 2 % PADS 6 each  6 each Topical Once Christine Klein, MD        OBJECTIVE: Middle-aged white woman in no acute distress  Vitals:   12/14/19 0933  BP: 134/71  Pulse: 74  Resp: 18  Temp: 98.5 F (36.9 C)  SpO2: 99%     Body mass index is 33.32 kg/m.   Wt Readings from Last 3 Encounters:  12/14/19 188 lb 1.6 oz (85.3 kg)  12/05/19 190 lb 3.2 oz (86.3 kg)  08/27/19 193 lb 6.4 oz (87.7 kg)      ECOG FS:1 - Symptomatic but completely ambulatory  GENERAL: Patient is a well appearing female in no acute distress HEENT:  Sclerae anicteric.  Oropharynx clear and moist. No ulcerations or evidence of oropharyngeal candidiasis. Neck is supple.  NODES:  No  cervical, supraclavicular, or axillary lymphadenopathy palpated.  BREAST EXAM:  Right breast s/p lumpectomy, mild  scar tissue above lumpectomy site is present stable, and unchanged, otherwise benign, left breast s/p lumpectomy and radiation , no sign of local recurrence. LUNGS:  Clear to auscultation bilaterally.  No wheezes or rhonchi. HEART:  Regular rate and rhythm. No murmur appreciated. ABDOMEN:  Soft, nontender.  Positive, normoactive bowel sounds. No organomegaly palpated. MSK:  No focal spinal tenderness to palpation. Full range of motion bilaterally in the upper extremities. EXTREMITIES:  No peripheral edema.   SKIN:  Clear with no obvious rashes or skin changes. No nail dyscrasia. NEURO:  Nonfocal. Well oriented.  Appropriate affect.   LAB RESULTS:  CMP     Component Value Date/Time   NA 139 08/08/2019 1011   K 4.1 08/08/2019 1011   CL 102 08/08/2019 1011   CO2 25 08/08/2019 1011   GLUCOSE 182 (H) 08/08/2019 1011   GLUCOSE 141 (H) 03/28/2006 0843   BUN 14 08/08/2019 1011   CREATININE 0.97 08/08/2019 1011   CREATININE 1.12 (H) 03/15/2018 1206   CREATININE 0.85 09/27/2013 1224   CALCIUM 9.6 08/08/2019 1011   PROT 6.9 08/08/2019 1011   ALBUMIN 4.1 08/08/2019 1011   AST 21 08/08/2019 1011   AST 44 (H) 03/15/2018 1206   ALT 34 08/08/2019 1011   ALT 70 (H) 03/15/2018 1206   ALKPHOS 74 08/08/2019 1011   BILITOT 0.3 08/08/2019 1011   BILITOT 0.5 03/15/2018 1206   GFRNONAA >60 04/26/2018 0930   GFRNONAA 50 (L) 03/15/2018 1206   GFRAA >60 04/26/2018 0930   GFRAA 58 (L) 03/15/2018 1206    No results found for: TOTALPROTELP, ALBUMINELP, A1GS, A2GS, BETS, BETA2SER, GAMS, MSPIKE, SPEI  No results found for: KPAFRELGTCHN, LAMBDASER, KAPLAMBRATIO  Lab Results  Component Value Date   WBC 4.2 08/08/2019   NEUTROABS 2.5 08/08/2019   HGB 12.0 08/08/2019   HCT 36.7 08/08/2019   MCV 80.8 08/08/2019   PLT 215.0 08/08/2019    @LASTCHEMISTRY @  No results found for:  LABCA2  No components found for: TGYBWL893  No results for input(s): INR in the last 168 hours.  No results found for: LABCA2  No results found for: TDS287  No results found for: GOT157  No results found for: WIO035  No results found for: CA2729  No components found for: HGQUANT  No results found for: CEA1 / No results found for: CEA1   No results found for: AFPTUMOR  No results found for: CHROMOGRNA  No results found for: PSA1  No visits with results within 3 Day(s) from this visit.  Latest known visit with results is:  Office Visit on 12/05/2019  Component Date Value Ref Range Status  . Hemoglobin A1C 12/05/2019 8.0* 4.0 - 5.6 % Final    (this displays the last labs from the last 3 days)  No results found for: TOTALPROTELP, ALBUMINELP, A1GS, A2GS, BETS, BETA2SER, GAMS, MSPIKE, SPEI (this displays SPEP labs)  No results found for: KPAFRELGTCHN, LAMBDASER, KAPLAMBRATIO (kappa/lambda light chains)  No results found for: HGBA, HGBA2QUANT, HGBFQUANT, HGBSQUAN (Hemoglobinopathy evaluation)   No results found for: LDH  No results found for: IRON, TIBC, IRONPCTSAT (Iron and TIBC)  No results found for: FERRITIN  Urinalysis    Component Value Date/Time   COLORURINE YELLOW 08/08/2019 [place] 08/08/2019 1011   LABSPEC 1.020 08/08/2019 1011   PHURINE 5.5 08/08/2019 [place] 08/08/2019 [place] 08/08/2019 1011   HGBUR negative 08/22/2009 [place] 08/08/2019 1011   BILIRUBINUR negative 07/27/2016  [place] 08/08/2019 [place] 07/18/2017 0908   UROBILINOGEN 0.2 08/08/2019 1011   NITRITE NEGATIVE 08/08/2019 [place] 08/08/2019 1011     STUDIES: No results found.  ELIGIBLE FOR AVAILABLE RESEARCH PROTOCOL: no  ASSESSMENT: 69 y.o. [place], [place] woman status post right breast biopsy 03/07/2018 for ductal carcinoma in situ, grade 3, estrogen  and progesterone receptor negative  (b) breast biopsy 03/23/2018 showed ductal carcinoma in situ, grade 1, estrogen and progesterone receptor positive  (1) status post right lumpectomy 05/02/2018 for a 0.9 cm ductal carcinoma in situ, grade 2, with focally positive margins  (a) left lumpectomy 05/02/2018 showed no residual disease  (b) additional surgery for margin clearance 05/25/2018 was successful  (2) adjuvant radiation completed 07/31/2018  (3) the patient has had genetics testing through her gynecologist.  Per patient no deleterious mutations found  (4) DEXA scan 02/28/2018 showed a T score of -1.7   PLAN: Dominigue is here today for evaluation of her right breast and a possible clogged duct.  This issue has resolved.  I checked her breasts and find no evidence of breast cancer recurrence, or need to expedite her imaging that is due in 02/2020.  We discussed her mammograms.  She wants to change from [facility] mammography to the [facility].  She notes that a technician told her to change.  I let [name] know that the care and expertise at both locations is the same.  She understands this and still wants to proceed.  I placed orders for her mammogram that is due in September, 2021.    I congratulated [name] on her great progress with improving her diet, exercise, and overall health.  I am very proud of her improved A1C and encouraged her to continue making progress, which she plans on.    [name] will return in 04/2020 for f/u with Dr. Jana Hakim after undergoing her mammogram in September.  She knows to call for any questions that may arise between now and her next appointment.  We are happy to see her sooner if needed.  Total encounter time: 20 minutes*   Wilber Bihari, NP 12/14/19 9:43 AM Medical Oncology and Hematology [facility] [place], [place] [address] Tel. [phone]    Fax. [phone]  *Total Encounter Time as defined by the Centers for  Medicare and Medicaid Services includes, in addition to the face-to-face time of a patient visit (documented in the note above) non-face-to-face time: obtaining and reviewing outside history, ordering and reviewing medications, tests or procedures, care coordination (communications with other health care professionals or caregivers) and documentation in the medical record.

## 2019-12-18 ENCOUNTER — Telehealth: Payer: Self-pay | Admitting: Adult Health

## 2019-12-18 NOTE — Telephone Encounter (Signed)
No 7/2 los. No changes made to pt's schedule.

## 2019-12-19 ENCOUNTER — Other Ambulatory Visit: Payer: Self-pay | Admitting: General Practice

## 2019-12-19 NOTE — Patient Outreach (Signed)
[place] [facility]) Care Management  12/19/2019  [name] 11/09/1950 [id number]   Client is newly enrolled in the Special Needs Plan program with Type II Diabetes. Individualized Care Plan (ICP) completed with information from the Health Risk Assessment on file. Client also has a history of Hypertension. Will send an introductory letter with ICP to the primary provider and client, along with educational materials.No recent acute admissions or ED visits since 2019. Assigned RN Care Coordinator will follow up in 3 months.

## 2019-12-19 NOTE — Patient Outreach (Signed)
[place] [facility]) Care Management  12/19/2019  [name] [birth year] [id number]   Client is newly enrolled in the Special Needs Plan program with Type II Diabetes, A1C elevated at 8.0. Individualized Care Plan (ICP) completed with information from the  Health Risk Assessment on file. Client also has a history of Hypertension. Pharmacy referral being sent to [facility]. Will send an introductory letter with ICP to the primary provider and client, along with educational materials. No acute care admissions or ED visits since 2019. Assigned RN Care Coordinator will follow up in 2 months.

## 2019-12-20 NOTE — Patient Outreach (Signed)
Received a referral for Pharmacy Assistance. Placed a ticket in [place] portal for pharmacy assistance. Ticket saved successfully with Tracking ID: [id number]

## 2019-12-31 DIAGNOSIS — E119 Type 2 diabetes mellitus without complications: Secondary | ICD-10-CM | POA: Diagnosis not present

## 2019-12-31 DIAGNOSIS — H524 Presbyopia: Secondary | ICD-10-CM | POA: Diagnosis not present

## 2019-12-31 LAB — HM DIABETES EYE EXAM

## 2020-01-01 ENCOUNTER — Encounter: Payer: Self-pay | Admitting: *Deleted

## 2020-01-02 ENCOUNTER — Other Ambulatory Visit: Payer: Self-pay

## 2020-01-02 ENCOUNTER — Emergency Department (HOSPITAL_BASED_OUTPATIENT_CLINIC_OR_DEPARTMENT_OTHER): Payer: HMO

## 2020-01-02 ENCOUNTER — Encounter (HOSPITAL_BASED_OUTPATIENT_CLINIC_OR_DEPARTMENT_OTHER): Payer: Self-pay

## 2020-01-02 ENCOUNTER — Emergency Department (HOSPITAL_BASED_OUTPATIENT_CLINIC_OR_DEPARTMENT_OTHER)
Admission: EM | Admit: 2020-01-02 | Discharge: 2020-01-02 | Disposition: A | Discharge status: Home / Self Care | Payer: HMO | Attending: Emergency Medicine | Admitting: Emergency Medicine

## 2020-01-02 DIAGNOSIS — E119 Type 2 diabetes mellitus without complications: Secondary | ICD-10-CM | POA: Diagnosis not present

## 2020-01-02 DIAGNOSIS — R0789 Other chest pain: Secondary | ICD-10-CM

## 2020-01-02 DIAGNOSIS — Z7984 Long term (current) use of oral hypoglycemic drugs: Secondary | ICD-10-CM | POA: Diagnosis not present

## 2020-01-02 DIAGNOSIS — R079 Chest pain, unspecified: Secondary | ICD-10-CM | POA: Diagnosis not present

## 2020-01-02 DIAGNOSIS — R1011 Right upper quadrant pain: Secondary | ICD-10-CM | POA: Diagnosis not present

## 2020-01-02 DIAGNOSIS — I700 Atherosclerosis of aorta: Secondary | ICD-10-CM | POA: Diagnosis not present

## 2020-01-02 DIAGNOSIS — I10 Essential (primary) hypertension: Secondary | ICD-10-CM | POA: Diagnosis not present

## 2020-01-02 LAB — LIPASE, BLOOD: Lipase: 46 U/L (ref 11–51)

## 2020-01-02 LAB — URINALYSIS, ROUTINE W REFLEX MICROSCOPIC
Bilirubin Urine: NEGATIVE
Glucose, UA: NEGATIVE mg/dL
Hgb urine dipstick: NEGATIVE
Ketones, ur: NEGATIVE mg/dL
Leukocytes,Ua: NEGATIVE
Nitrite: NEGATIVE
Protein, ur: NEGATIVE mg/dL
Specific Gravity, Urine: >1.03 — ABNORMAL HIGH (ref 1.005–1.030)
pH: 5 (ref 5.0–8.0)

## 2020-01-02 LAB — COMPREHENSIVE METABOLIC PANEL
ALT: 31 U/L (ref 0–44)
AST: 26 U/L (ref 15–41)
Albumin: 3.9 g/dL (ref 3.5–5.0)
Alkaline Phosphatase: 68 U/L (ref 38–126)
Anion gap: 13 (ref 5–15)
BUN: 20 mg/dL (ref 8–23)
CO2: 23 mmol/L (ref 22–32)
Calcium: 9.2 mg/dL (ref 8.9–10.3)
Chloride: 105 mmol/L (ref 98–111)
Creatinine, Ser: 1.2 mg/dL — ABNORMAL HIGH (ref 0.44–1.00)
GFR calc Af Amer: 54 mL/min — ABNORMAL LOW (ref >60)
GFR calc non Af Amer: 47 mL/min — ABNORMAL LOW (ref >60)
Glucose, Bld: 154 mg/dL — ABNORMAL HIGH (ref 70–99)
Potassium: 4 mmol/L (ref 3.5–5.1)
Sodium: 141 mmol/L (ref 135–145)
Total Bilirubin: 0.2 mg/dL — ABNORMAL LOW (ref 0.3–1.2)
Total Protein: 7.6 g/dL (ref 6.5–8.1)

## 2020-01-02 LAB — CBC
HCT: 39.6 % (ref 36.0–46.0)
Hemoglobin: 12.5 g/dL (ref 12.0–15.0)
MCH: 25.6 pg — ABNORMAL LOW (ref 26.0–34.0)
MCHC: 31.6 g/dL (ref 30.0–36.0)
MCV: 81 fL (ref 80.0–100.0)
Platelets: 249 10*3/uL (ref 150–400)
RBC: 4.89 MIL/uL (ref 3.87–5.11)
RDW: 13.4 % (ref 11.5–15.5)
WBC: 5.1 10*3/uL (ref 4.0–10.5)
nRBC: 0 % (ref 0.0–0.2)

## 2020-01-02 LAB — TROPONIN I (HIGH SENSITIVITY)
Troponin I (High Sensitivity): 3 ng/L (ref <18)
Troponin I (High Sensitivity): 3 ng/L (ref <18)

## 2020-01-02 MED ORDER — SODIUM CHLORIDE 0.9% FLUSH
3.0000 mL | Freq: Once | INTRAVENOUS | Status: DC
  Filled 2020-01-02: qty 3

## 2020-01-02 MED ORDER — PANTOPRAZOLE SODIUM 40 MG PO TBEC
40.0000 mg | DELAYED_RELEASE_TABLET | Freq: Every day | ORAL | 0 refills | Status: DC
Start: 2020-01-02 — End: 2020-08-18

## 2020-01-02 MED ORDER — IOHEXOL 300 MG/ML  SOLN
100.0000 mL | Freq: Once | INTRAMUSCULAR | Status: AC | PRN
  Administered 2020-01-02: 100 mL via INTRAVENOUS

## 2020-01-02 MED ORDER — DICLOFENAC SODIUM 1 % EX GEL: 2.0000 g | Freq: Four times a day (QID) | CUTANEOUS | 0 refills | Status: DC | PRN

## 2020-01-02 NOTE — Discharge Instructions (Signed)
You were seen in the emergency room today with right-sided abdominal pain and chest discomfort.  Your lab work for heart issues as well as gallbladder were normal today.  I am starting a medication to help with gastritis called Protonix that she should take as directed and discussed with your primary care doctor.  They can decide if further medication or referral is needed.  I have also called in a prescription for Voltaren gel which you can rub on the chest where you are having discomfort.  If your chest pain worsens or you develop shortness of breath or other new or suddenly worsening symptoms please return to the emergency department immediately for reevaluation.

## 2020-01-02 NOTE — ED Notes (Signed)
XR bedside.

## 2020-01-02 NOTE — ED Notes (Signed)
Patient transported to CT 

## 2020-01-02 NOTE — ED Notes (Signed)
Patient transported to Ultrasound 

## 2020-01-02 NOTE — Patient Outreach (Addendum)
  [place] [facility]) Care Management Chronic Special Needs Program    01/02/2020  Name: [name], DOB: 05/02/1951  MRN: [id number]   [name] is enrolled in a chronic special needs plan for Diabetes. Telephone call to client regarding 24 hour nurse call line referral. Unable to reach. HIPAA compliant voice message left with call back phone number and return call request.  Per chart review client currently at emergency department.   PLAN: RNCM will attempt 2nd telephone call to client in 1 week.   Quinn Plowman RN,BSN,CCM [facility] Care Management [phone]

## 2020-01-02 NOTE — ED Provider Notes (Signed)
Emergency Department Provider Note   I have reviewed the triage vital signs and the nursing notes.   HISTORY  Chief Complaint Abdominal Pain   HPI [name] is a 69 y.o. female with past medical history reviewed below presents to the emergency department with right upper quadrant abdominal pain radiating to the back over the past 5 days.  Patient describes symptoms which are intermittent and not changed with food.  She has pain which seems worse at night and with laying flat and improved in the morning.  No significant exertional or pleuritic pain symptoms.  Denies shortness of breath, fever, URI symptoms.  No similar pain like this in the past.  Denies history of cholecystectomy.  No leg swelling or pain. No other PE risk factors recently. Notes some occasional nausea but no vomiting or diarrhea.    Past Medical History:  Diagnosis Date  . Acute bronchitis 10/31/2015  . Allergy    seasonal  . Arthritis    knee-left  . Cancer ([place]) 04/2018   right breast ca  . Cataract    right eye  . Diabetes mellitus 52   type 2  . Diaphragmatic hernia without mention of obstruction or gangrene   . Endometriosis   . Esophageal reflux   . Esophageal stricture   . GERD (gastroesophageal reflux disease)   . Hyperlipidemia   . Hyperplastic colon polyp   . Hypertension 52  . Nonalcoholic fatty liver disease 04/28/2010   Qualifier: Diagnosis of  By: Arnoldo Morale MD, Balinda Quails   . Nonspecific abnormal results of liver function study   . Personal history of radiation therapy 08/2018   bilateral breasts  . Pharyngitis 02/18/2012  . Plantar fascial fibromatosis   . Preventative health care 01/22/2013  . Seasonal allergies 10/31/2015  . Sleep apnea    has oral appliance but does not fit well due to missing teeth  . Sore throat 04/03/2017  . Tubular adenoma of colon   . Unspecified sleep apnea    no c-pap    Patient Active Problem List   Diagnosis Date Noted  . Chest wall asymmetry  12/05/2019  . Uncontrolled type 2 diabetes mellitus with hyperglycemia ([place]) 12/05/2019  . Dyslipidemia 12/05/2019  . Insomnia 07/23/2019  . Shingles 02/18/2019  . Ductal carcinoma in situ (DCIS) of left breast 07/03/2018  . Vertigo 03/13/2018  . Ductal carcinoma in situ (DCIS) of right breast 03/10/2018  . Bilateral calf pain 10/03/2017  . Right shoulder pain 10/03/2017  . Headache 06/30/2017  . Obesity (BMI 30-39.9) 05/02/2017  . Seasonal allergies 10/31/2015  . Hx of adenomatous colonic polyps 04/30/2014  . Cervical cancer screening 01/22/2013  . Preventative health care 01/22/2013  . Chest pain 01/06/2012  . Endometriosis   . Chronic cough 08/31/2011  . Nonalcoholic fatty liver disease 04/28/2010  . OSA (obstructive sleep apnea) 03/26/2009  . RT BUNDLE BRANCH BLOCK&LT ANT FASCICULAR BLOCK 12/20/2008  . Thoracic back pain 10/10/2008  . Osteoarthritis 07/05/2008  . Abdominal pain 07/05/2008  . Aneurysm, thoracoabdominal ([place]) 03/22/2007  . Type 2 diabetes mellitus with hyperglycemia, without Peggyann Zwiefelhofer-term current use of insulin ([place]) 10/28/2006  . Hyperlipidemia, mixed 10/28/2006  . Depression with anxiety 10/28/2006  . Essential hypertension 10/28/2006  . ESOPHAGEAL STRICTURE 05/04/2004  . HIATAL HERNIA 05/04/2004    Past Surgical History:  Procedure Laterality Date  . ABDOMINAL HYSTERECTOMY  1982   partial  . APPENDECTOMY    . 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  BREAST BIOPSY Bilateral 04/03/2018  . BREAST BIOPSY Bilateral 03/23/2018  . BREAST CYST INCISION AND DRAINAGE     right breast  . BREAST EXCISIONAL BIOPSY Right 2019  . BREAST LUMPECTOMY Bilateral 04/2018  . BREAST LUMPECTOMY WITH RADIOACTIVE SEED LOCALIZATION Bilateral 05/02/2018   Procedure: RIGHT BREAST SEED LUMPECTOMY X2 AND LEFT BREAST SEED GUIDED LUMPECTOMY;  Surgeon: [name], MD;  Location: [facility];  Service: General;  Laterality: Bilateral;  . CATARACT EXTRACTION     right eye   . HERNIA REPAIR     per pt, she is not aware of this surgery  . RE-EXCISION OF BREAST LUMPECTOMY Right 05/25/2018   Procedure: RE-EXCISION OF RIGHT BREAST LUMPECTOMY;  Surgeon: [name], MD;  Location: [facility];  Service: General;  Laterality: Right;  . seba    . TUBAL LIGATION      Allergies Pravastatin, Amoxicillin, Atorvastatin, and Codeine  Family History  Problem Relation Age of Onset  . Breast cancer Mother   . Hypertension Mother   . Diabetes Mother        type 2  . Other Mother        esophagus surgery  . Heart disease Father   . Hyperlipidemia Father   . Hypertension Father   . Heart attack Father        MI at age 65  . Diabetes Brother        type 2  . Heart disease Maternal Grandmother   . Heart attack Maternal Grandfather   . Hypertension Maternal Grandfather   . Stroke Paternal Grandmother   . Hypertension Sister   . Diabetes Sister   . Hypertension Brother   . Alcohol abuse Maternal Aunt   . Cancer Maternal Aunt   . Colon cancer Neg Hx   . Stomach cancer Neg Hx   . Esophageal cancer Neg Hx     Social History Social History   Tobacco Use  . Smoking status: Never Smoker  . Smokeless tobacco: Never Used  Substance Use Topics  . Alcohol use: No    Alcohol/week: 0.0 standard drinks  . Drug use: No    Review of Systems  Constitutional: No fever/chills Eyes: No visual changes. ENT: No sore throat. Cardiovascular: Negative chest pain. Respiratory: Denies shortness of breath. Gastrointestinal: Positive RUQ abdominal pain. Mild nausea, no vomiting.  No diarrhea.  No constipation. Genitourinary: Negative for dysuria. Musculoskeletal: Negative for back pain. Skin: Negative for rash. Neurological: Negative for headaches, focal weakness or numbness.  10-point ROS otherwise negative.  ____________________________________________   PHYSICAL EXAM:  VITAL SIGNS: ED Triage Vitals  Enc Vitals Group     BP 01/02/20 1437  125/66     Pulse Rate 01/02/20 1437 75     Resp 01/02/20 1437 18     Temp 01/02/20 1437 98.6 F (37 C)     Temp Source 01/02/20 1437 Oral     SpO2 01/02/20 1437 98 %     Weight 01/02/20 1437 191 lb (86.6 kg)     Height 01/02/20 1437 5\' 3"  (1.6 m)   Constitutional: Alert and oriented. Well appearing and in no acute distress. Eyes: Conjunctivae are normal.  Head: Atraumatic. Nose: No congestion/rhinnorhea. Mouth/Throat: Mucous membranes are moist.   Neck: No stridor.  Cardiovascular: Normal rate, regular rhythm. Good peripheral circulation. Grossly normal heart sounds.   Respiratory: Normal respiratory effort.  No retractions. Lungs CTAB. Gastrointestinal: Soft with mild RUQ tenderness on exam. Negative Murphy's sign. No distention.  Musculoskeletal: No lower extremity tenderness nor edema. No gross deformities of extremities. No tenderness over the chest wall.  Neurologic:  Normal speech and language. No gross focal neurologic deficits are appreciated.  Skin:  Skin is warm, dry and intact. No rash noted.   ____________________________________________   LABS (all labs ordered are listed, but only abnormal results are displayed)  Labs Reviewed  COMPREHENSIVE METABOLIC PANEL - Abnormal; Notable for the following components:      Result Value   Glucose, Bld 154 (*)    Creatinine, Ser 1.20 (*)    Total Bilirubin 0.2 (*)    GFR calc non Af Amer 47 (*)    GFR calc Af Amer 54 (*)    All other components within normal limits  CBC - Abnormal; Notable for the following components:   MCH 25.6 (*)    All other components within normal limits  URINALYSIS, ROUTINE W REFLEX MICROSCOPIC - Abnormal; Notable for the following components:   Specific Gravity, Urine >1.030 (*)    All other components within normal limits  LIPASE, BLOOD  TROPONIN I (HIGH SENSITIVITY)  TROPONIN I (HIGH SENSITIVITY)   ____________________________________________  EKG   EKG  Interpretation  Date/Time:  Wednesday January 02 2020 14:52:19 EDT Ventricular Rate:  75 PR Interval:  176 QRS Duration: 130 QT Interval:  436 QTC Calculation: 486 R Axis:   -76 Text Interpretation: Normal sinus rhythm Right bundle branch block Left anterior fascicular block. Bifascicular block  Non-specific ST-t changes Confirmed by Lajean Saver [phone]) on 01/02/2020 2:57:40 PM       ____________________________________________  RADIOLOGY  CT ABDOMEN PELVIS W CONTRAST  Result Date: 01/02/2020 CLINICAL DATA:  Right upper quadrant abdominal pain radiating to back, symptoms for 5 days EXAM: CT ABDOMEN AND PELVIS WITH CONTRAST TECHNIQUE: Multidetector CT imaging of the abdomen and pelvis was performed using the standard protocol following bolus administration of intravenous contrast. CONTRAST:  122mL OMNIPAQUE IOHEXOL 300 MG/ML  SOLN COMPARISON:  08/20/2016 FINDINGS: Lower chest: No acute pleural or parenchymal lung disease. Hepatobiliary: There is diffuse hepatic steatosis without focal liver parenchymal abnormality. No intrahepatic biliary duct dilation. Gallbladder is unremarkable. Pancreas: Unremarkable. No pancreatic ductal dilatation or surrounding inflammatory changes. Spleen: Normal in size without focal abnormality. Adrenals/Urinary Tract: Adrenal glands are unremarkable. Kidneys are normal, without renal calculi, focal lesion, or hydronephrosis. Bladder is unremarkable. Stomach/Bowel: No bowel obstruction or ileus. No wall thickening or inflammatory change. The appendix is surgically absent. Vascular/Lymphatic: Aortic atherosclerosis. No enlarged abdominal or pelvic lymph nodes. Reproductive: Status post hysterectomy. No adnexal masses. Other: No abdominal wall hernia or abnormality. No abdominopelvic ascites. Musculoskeletal: No acute or destructive bony lesions. Prominent spondylosis at L5/S1, stable. Reconstructed images demonstrate no additional findings. IMPRESSION: 1. No acute  intra-abdominal or intrapelvic process. 2. Diffuse hepatic steatosis. 3. Aortic Atherosclerosis (ICD10-I70.0). Electronically Signed   By: Randa Ngo M.D.   On: 01/02/2020 19:20   DG Chest Portable 1 View  Result Date: 01/02/2020 CLINICAL DATA:  Chest pain, right upper quadrant pain radiating to back for 5 days EXAM: PORTABLE CHEST 1 VIEW COMPARISON:  Radiograph 10/31/2015, CT 08/12/2011 FINDINGS: No consolidation, features of edema, pneumothorax, or effusion. Slight prominence of the cardiac silhouette likely related to portable technique. Cardiomediastinal contours otherwise unremarkable. Surgical clips projecting over the bilateral breast tissues compatible with prior lumpectomies. Additional mediastinal surgical clips similar to prior. No acute osseous or soft tissue abnormality. IMPRESSION: No acute cardiopulmonary disease. Electronically Signed   By: Elwin Sleight.D.  On: 01/02/2020 18:13    ____________________________________________   PROCEDURES  Procedure(s) performed:   Procedures  None ____________________________________________   INITIAL IMPRESSION / ASSESSMENT AND PLAN / ED COURSE  Pertinent labs & imaging results that were available during my care of the patient were reviewed by me and considered in my medical decision making (see chart for details).   Patient presents emerged department with right upper quadrant abdominal pain radiating to the back over the past 5 days.  Labs from triage reviewed with normal LFTs and bilirubin.  No leukocytosis.  Mild right upper quadrant tenderness on exam.  Plan to follow with upper quadrant ultrasound to further evaluate for possible acute cholecystitis but feel this is less likely.  Could be symptomatic cholelithiasis.  Lower suspicion for cardiac etiology of chest pain.  Vitals, presentation, exam, not consistent with PE although this was considered.  Labs and imaging reviewed along with CXR and CT imaging. No acute findings.  Plan for symptom mgmt at home w/ topical pain meds for possible MSK etiology. Gallbladder without stones or other abnormalities. [place] broke mid-exam and so this exam was aborted and CT performed with no backup [place] option.  ____________________________________________  FINAL CLINICAL IMPRESSION(S) / ED DIAGNOSES  Final diagnoses:  RUQ abdominal pain  Right-sided chest wall pain     MEDICATIONS GIVEN DURING THIS VISIT:  Medications  iohexol (OMNIPAQUE) 300 MG/ML solution 100 mL (100 mLs Intravenous Contrast Given 01/02/20 1902)     NEW OUTPATIENT MEDICATIONS STARTED DURING THIS VISIT:  Discharge Medication List as of 01/02/2020  8:37 PM    START taking these medications   Details  diclofenac Sodium (VOLTAREN) 1 % GEL Apply 2 g topically 4 (four) times daily as needed., Starting Wed 01/02/2020, Normal    pantoprazole (PROTONIX) 40 MG tablet Take 1 tablet (40 mg total) by mouth daily., Starting Wed 01/02/2020, Until Fri 02/01/2020, Normal        Note:  This document was prepared using Dragon voice recognition software and may include unintentional dictation errors.  Nanda Quinton, MD, [facility]    Brunette Lavalle, [facility], MD 01/03/20 [phone]

## 2020-01-02 NOTE — ED Triage Notes (Signed)
Pt c/o RUQ pain radiates to back x 5 days-denies n/v/d-NAD-steady gait

## 2020-01-03 ENCOUNTER — Encounter: Payer: Self-pay | Admitting: *Deleted

## 2020-01-07 ENCOUNTER — Telehealth: Payer: Self-pay | Admitting: Family Medicine

## 2020-01-07 NOTE — Telephone Encounter (Signed)
FYI

## 2020-01-07 NOTE — Telephone Encounter (Signed)
Patient is calling to let you know she when in the hospital on 01/02/20. Patient did not want it to schedule an appointment.

## 2020-01-08 ENCOUNTER — Other Ambulatory Visit: Payer: Self-pay

## 2020-01-09 ENCOUNTER — Other Ambulatory Visit: Payer: Self-pay

## 2020-01-09 NOTE — Patient Outreach (Addendum)
[facility]) Care Management Chronic Special Needs Program  01/09/2020  Name: [name] DOB: Oct 27, 1950  MRN: [id number]  [name] is enrolled in a chronic special needs plan for Diabetes. Chronic Care Management Coordinator telephoned client to review health risk assessment and to develop individualized care plan.  HIPAA verified by client.  Introduced the chronic care management program, importance of client participation, and taking their care plan to all provider appointments and inpatient facilities. Client states she was seen in the emergency room on 01/02/20 due to right sided chest pain.  Denies any reportable findings. Client states the pain has improved some. RNCM advised client to contact her doctor for ongoing symptoms. Client verbalized understanding. Client states she is working on getting her A1c down. She reports checking her blood sugars 2 times per day and states she has had a few hypoglycemic episodes in the low 70's with symptoms. Client states she has discussed this with her endocrinologist.  RNCM discussed hypoglycemic management with client. Client verbalized understanding.     Goals Addressed              This Visit's Progress   .  "bring my A1c down" (pt-stated)        Your last documented A1c was 8.8 Plan to eat low carbohydrate and low salt meals, Watch portion sizes and avoid sugar sweetened drinks.  RN case manager will refer client to [employer] Health coach.  Discussed diabetes self management actions:  Glucose monitoring per provider recommendation  Check feet daily  Visit provider every 3-6 months as directed  Hbg A1C level every 3-6 months.  Eye Exam yearly  Carbohydrate controlled meal planning  Taking diabetes medication as prescribed by provider  Physical activity     .  Client will      .  COMPLETED: Client will report abillity to obtain Medications within 1 month.        Client reports she is  able to afford her medications.    .  client will report contacting doctor for ongoing right chest pain symptoms in 6 months.        Contact your doctor for ongoing or worsening chest pain symptoms.  Contact 911 for severe symptoms.     .  Client will report having follow up with pharmacist regarding medication side effects/questions in 6 months.        RN case manager will refer client to pharmacist     .  Client will report ongoing chest pain symptoms to provider in 6 months.        Contact you provider for ongoing chest pain Call 911 for severe symptoms.     .  Client will verbalize a decrease in hypoglycemic episodes <70 in 6 months.        Report ongoing low blood sugar levels to your provider.  Continue to take your medication as prescribed.  RN case manager will send client education article: Low blood sugar in people with diabetes.  Please follow the RULE OF 15 for the treatment of hypoglycemia treatment (When your blood sugars are less than 70 mg/ dl) STEP  1:  Take 15 grams of carbohydrates when your blood sugar is low, which includes:   3-4 glucose tabs or  3-4 oz of juice or regular soda or  One tube of glucose gel STEP 2:  Recheck blood sugar in 15 minutes STEP 3:  If your blood sugar is still low at the 15  minute recheck ---then, go back to STEP 1 and treat again with another 15 grams of carbohydrates     .  Maintain timely refills of diabetic medication as prescribed within the year .        Client reports maintaining timely refills of her diabetic medications.  Contact your RN case manager if you are unable to obtain your medications.  Continue to take your medications as prescribed.  RN case manager will refer client to pharmacist for questions/ concerns regarding side effects.     .  COMPLETED: Obtain Annual Eye (retinal)  Exam         Client reports her most recent eye exam was 12/31/19    .  COMPLETED: Obtain Annual Foot Exam        You last documented foot exam  was 12/05/19    .  Obtain annual screen for micro albuminuria (urine) , nephropathy (kidney problems)        Your last documented micro albuminuria was 12/04/15 Diabetes can affect your kidneys It is important that your urine is checked at least once a year.  These test show how your kidneys are working.  RN case manager will send client education article: Microalbuminuria    .  COMPLETED: Obtain Hemoglobin A1C at least 2 times per year        Hgb A1c completed 08/27/19 and 12/05/19    .  Visit Primary Care Provider or Endocrinologist at least 2 times per year         Primary care provider 08/08/19  Endocrinology visit 12/05/19 Continue to follow up with your primary care provider for your annual wellness exam.  Follow up with providers as recommended.        Plan:  Send successful outreach letter with a copy of their individualized care plan and Send individual care plan to provider  Chronic care management coordination will outreach in:  6 Months  Will refer client to:  Pharmacy and Health coach   Quinn Plowman RN,BSN,CCM [place] [facility] [phone]

## 2020-01-10 ENCOUNTER — Other Ambulatory Visit: Payer: Self-pay | Admitting: Family Medicine

## 2020-01-10 MED FILL — OMEPRAZOLE 40 MG CPDR: 40 | 90 days supply | Qty: 180 | Fill #1

## 2020-01-10 MED FILL — METFORMIN HCL 1000 MG TABS: 1000 | 90 days supply | Qty: 180 | Fill #0

## 2020-01-10 MED FILL — BISOPROLOL-HCTZ 5-6.25 MG T: 5-6.25 | 90 days supply | Qty: 90 | Fill #0

## 2020-01-18 MED FILL — ROSUVASTATIN CALCIUM 5 MG T: 5 | 90 days supply | Qty: 90 | Fill #1

## 2020-01-29 MED FILL — FREESTYLE LITE TEST STRIP: 50 days supply | Qty: 50 | Fill #0

## 2020-02-07 ENCOUNTER — Telehealth: Payer: Self-pay | Admitting: Family Medicine

## 2020-02-07 NOTE — Telephone Encounter (Signed)
New Message:   Pt is wanting to know if she can be worked in for a CPE before 08/06/19 with Dr. Charlett Blake. Please advise.

## 2020-02-10 IMAGING — MG MM CLIP PLACEMENT
2 series · 2 of 2 positions shown · non-contrast
Comparison: Previous exam(s).

CLINICAL DATA: Post ultrasound-guided biopsy of a mass in the left
breast at the 12 to [DATE] position and ultrasound-guided biopsy of a
mass in the right breast at the 10 o'clock position.

EXAM:
DIAGNOSTIC BILATERAL MAMMOGRAM POST ULTRASOUND BIOPSY

[L CC]
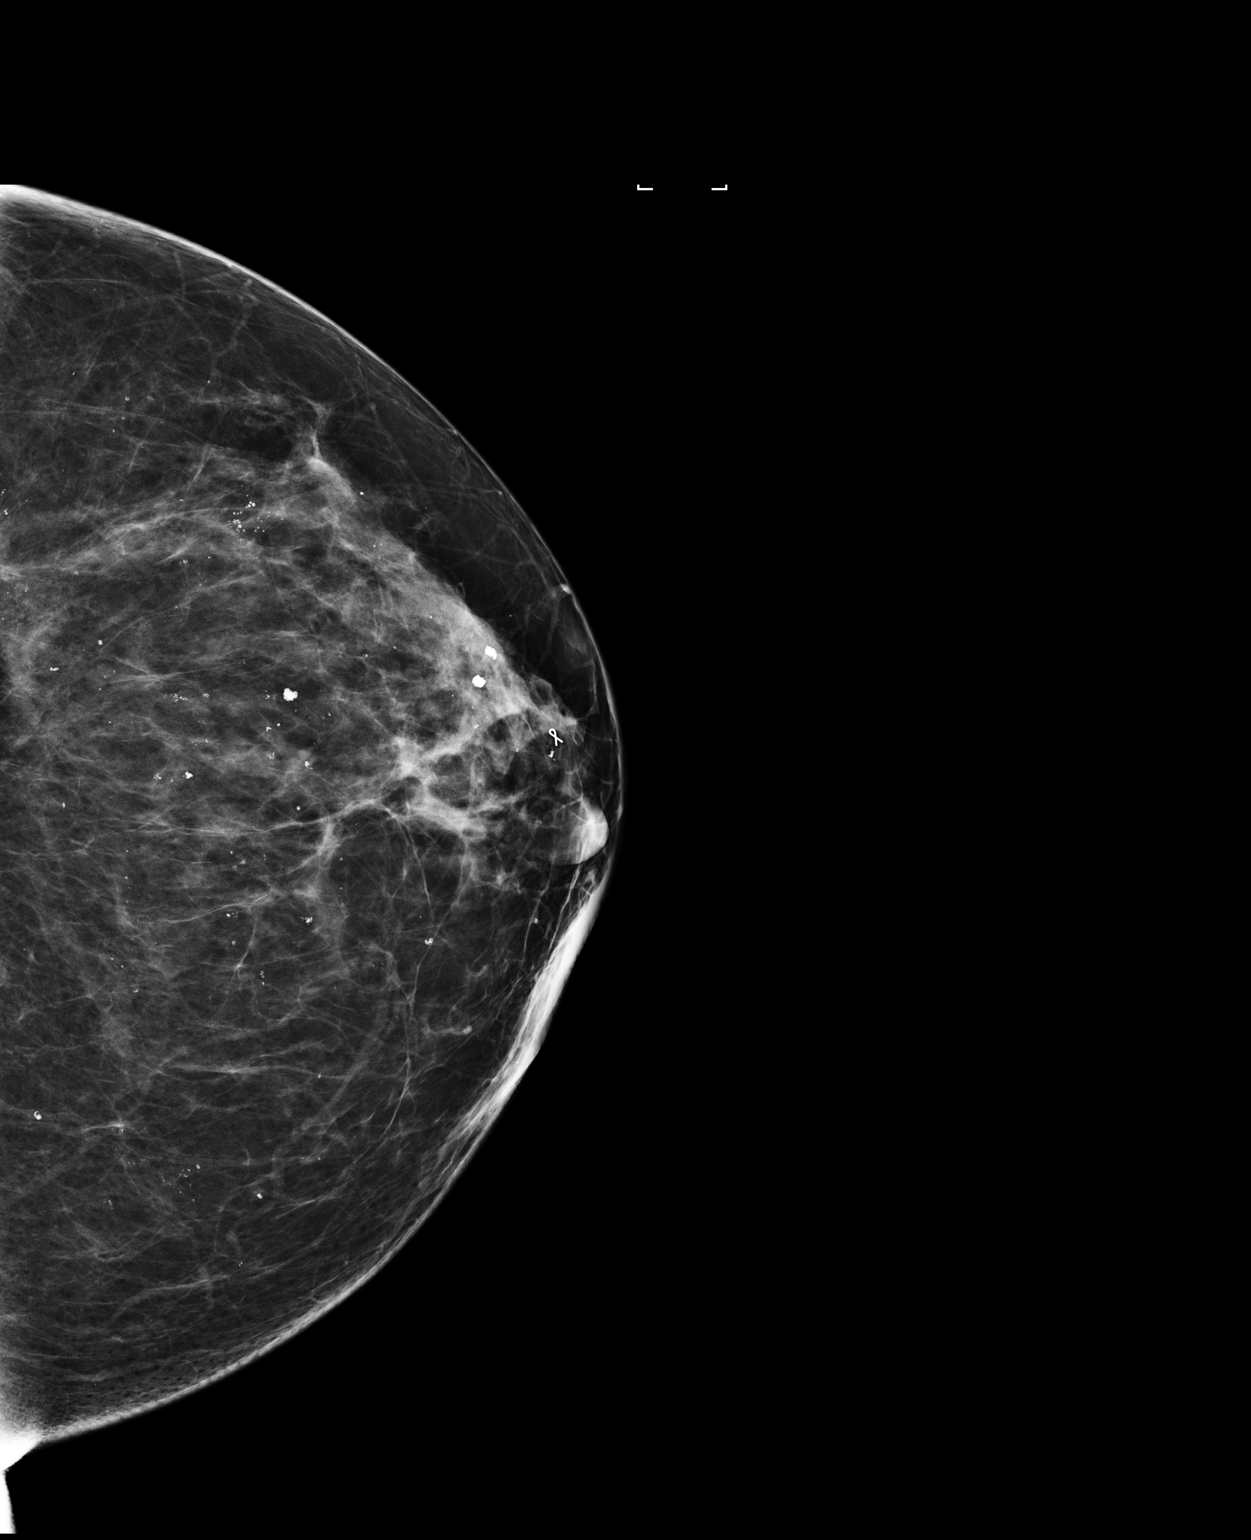

[L ML]
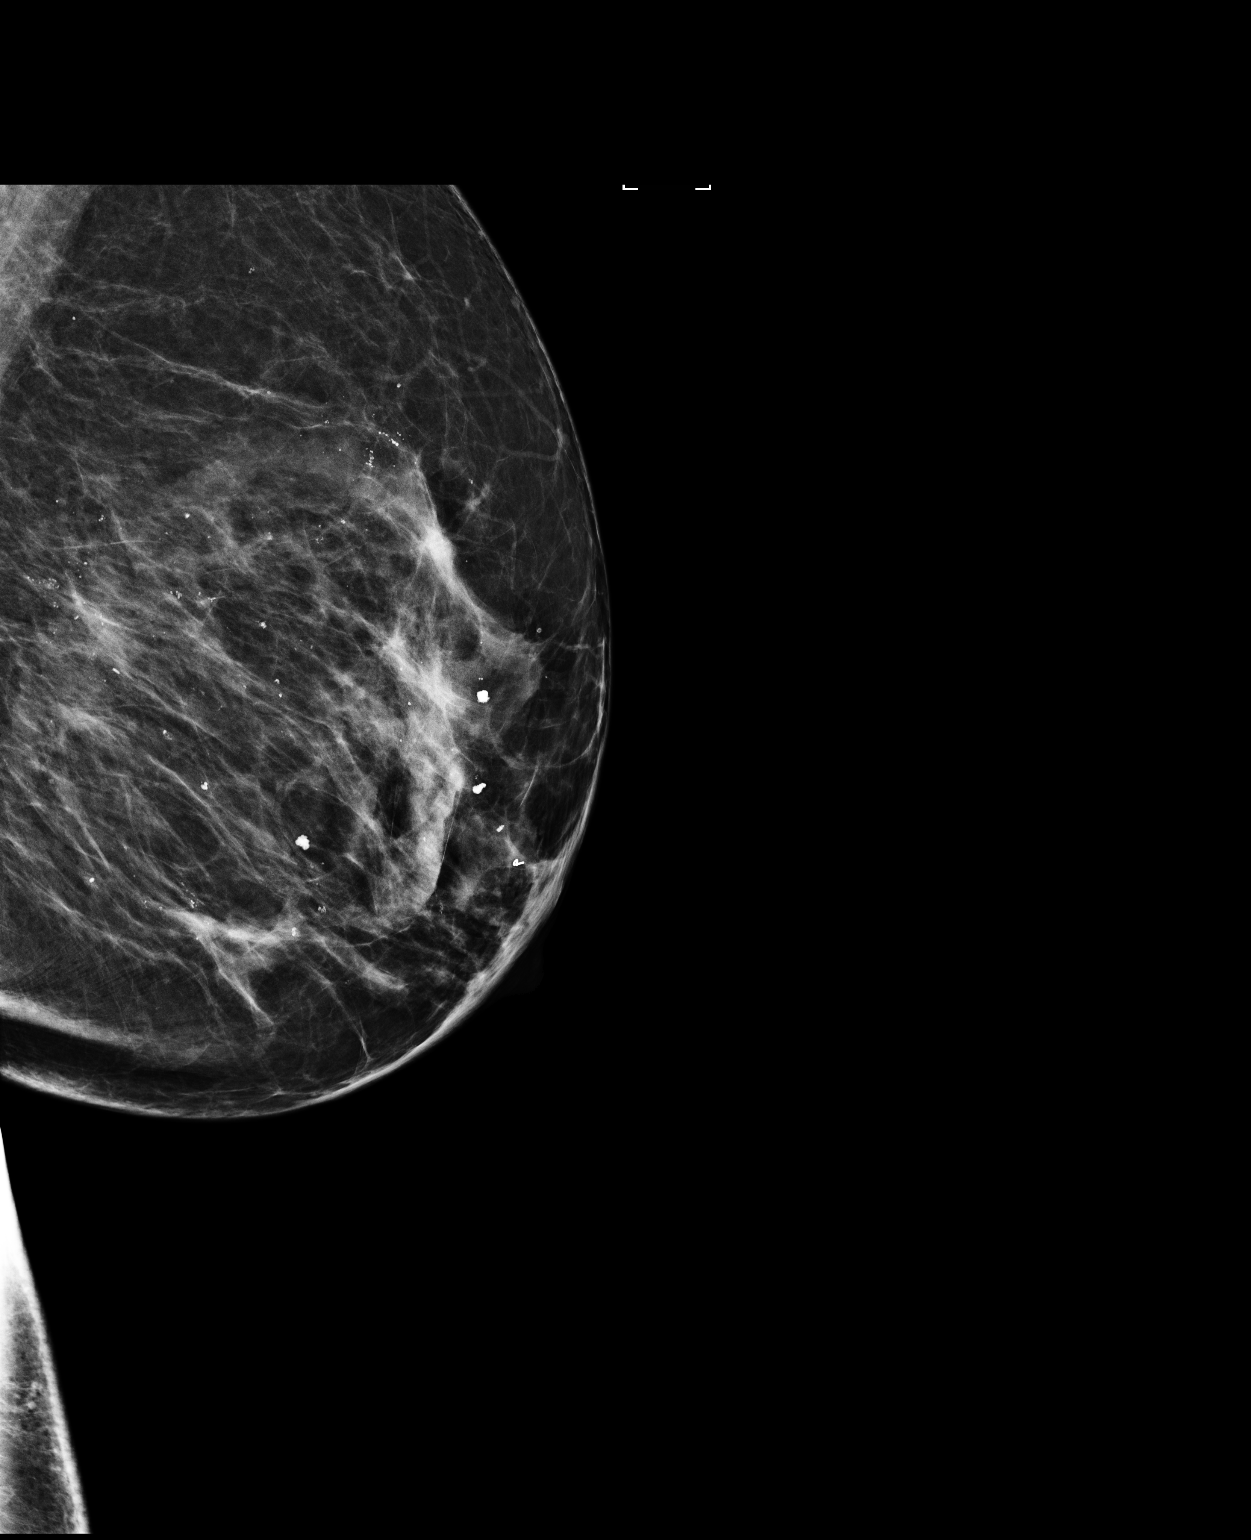

[2 of 2 positions shown; findings below may reference images not displayed]

FINDINGS: Mammographic images were obtained following ultrasound guided biopsy
of a mass in the left breast at the 12 to [DATE] position and
ultrasound-guided biopsy of a mass in the right breast at the 10
o'clock position. A ribbon shaped biopsy marking clip is present at
the site of the biopsied mass in the left breast at the 12 to [DATE]
position. A ribbon shaped biopsy marking clip is present at the site
of the biopsied mass in the right breast at the 10 o'clock position.
IMPRESSION: 1. Ribbon shaped biopsy marking clip at site of biopsied mass in the
left breast at the 12 to [DATE] (slightly outer) position.

2. Ribbon shaped biopsy marking clip at site of biopsied mass in the
right breast at the 10 o'clock position.

Final Assessment: Post Procedure Mammograms for Marker Placement

## 2020-02-10 IMAGING — US ULTRASOUND LEFT BREAST LIMITED
1 series · 6 of 6 positions shown · non-contrast
Comparison: Previous exam(s).

CLINICAL DATA: 67-year-old female with recently diagnosed right
breast DCIS. Breast MRI dated 03/17/2018 demonstrated a 1.1 cm
suspicious area of non mass enhancement in the right breast at the 2
o'clock position, multiple small rounded enhancing masses in the
right breast with the most suspicious 0.8 cm mass in the upper outer
quadrant anterior depth at the 10 o'clock position, and a 0.5 cm
enhancing mass in the anterior left breast at the [DATE] position.
Second-look ultrasound with possible biopsy of these areas was
recommended.

EXAM:
ULTRASOUND OF THE BILATERAL BREAST

[Series 1: ultrasound left breast limited · 0.06mm/px · 6 of 6 slices shown]
[im 1/6]
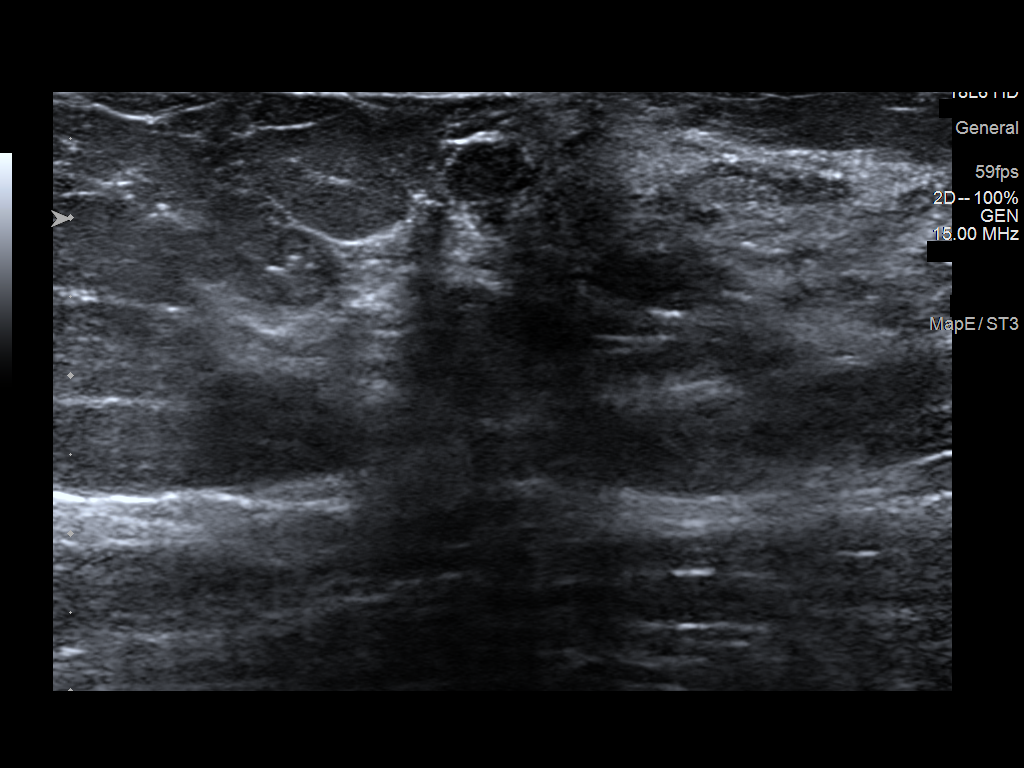
[im 2/6]
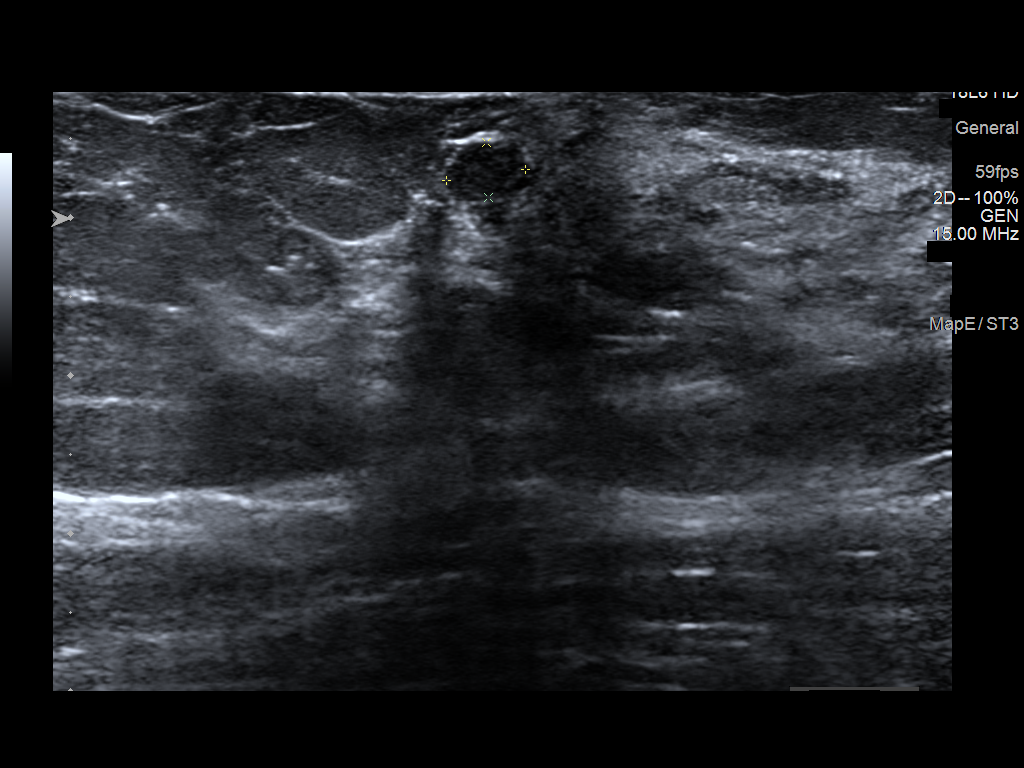
[im 3/6]
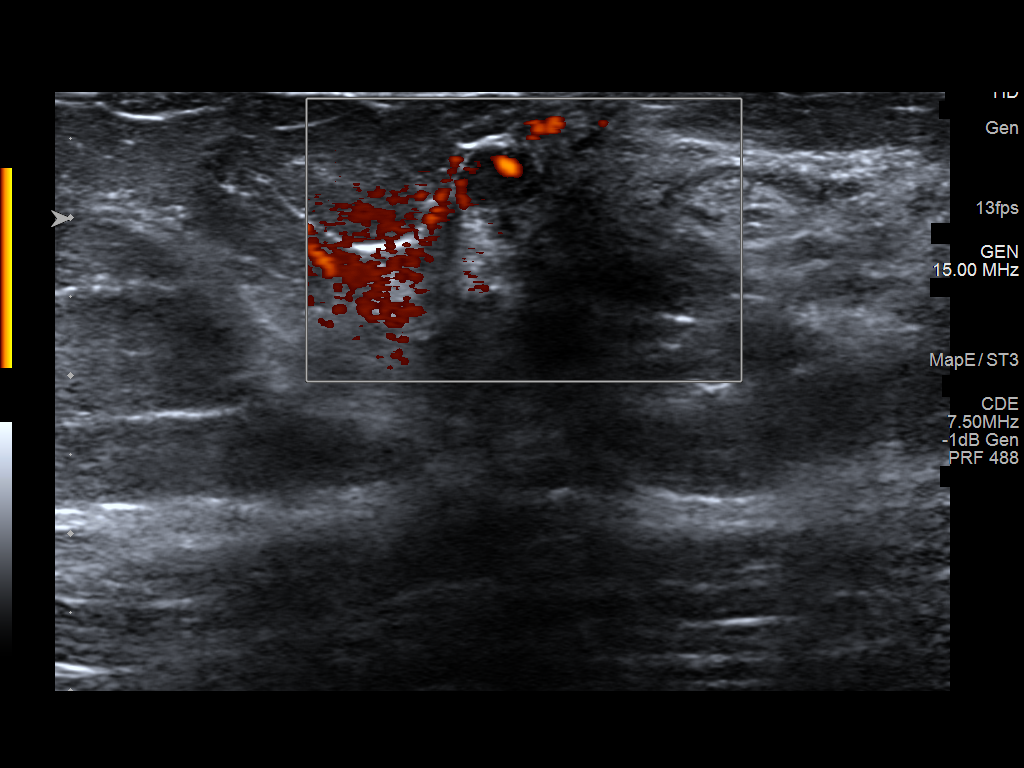
[im 4/6]
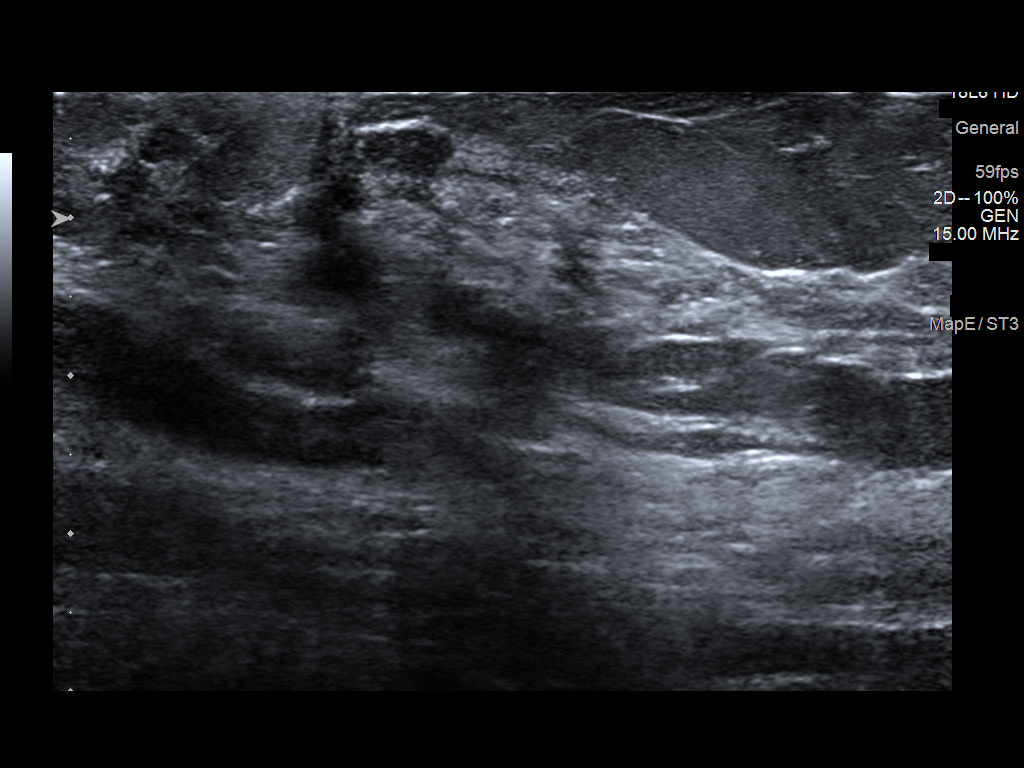
[im 5/6]
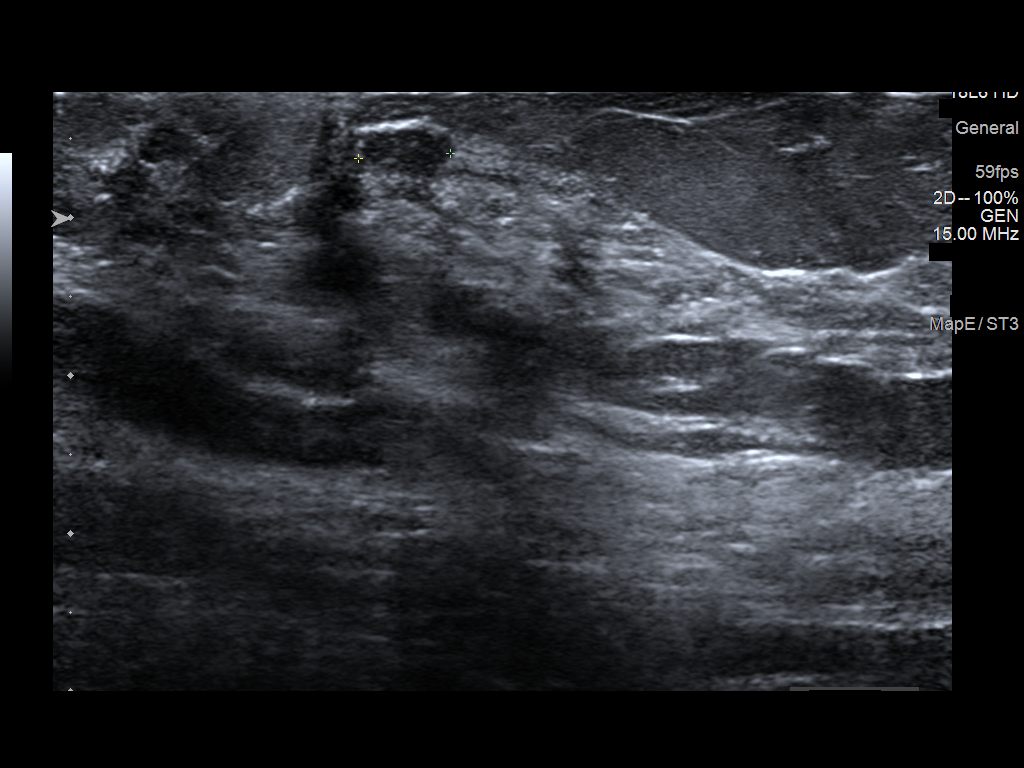
[im 6/6]
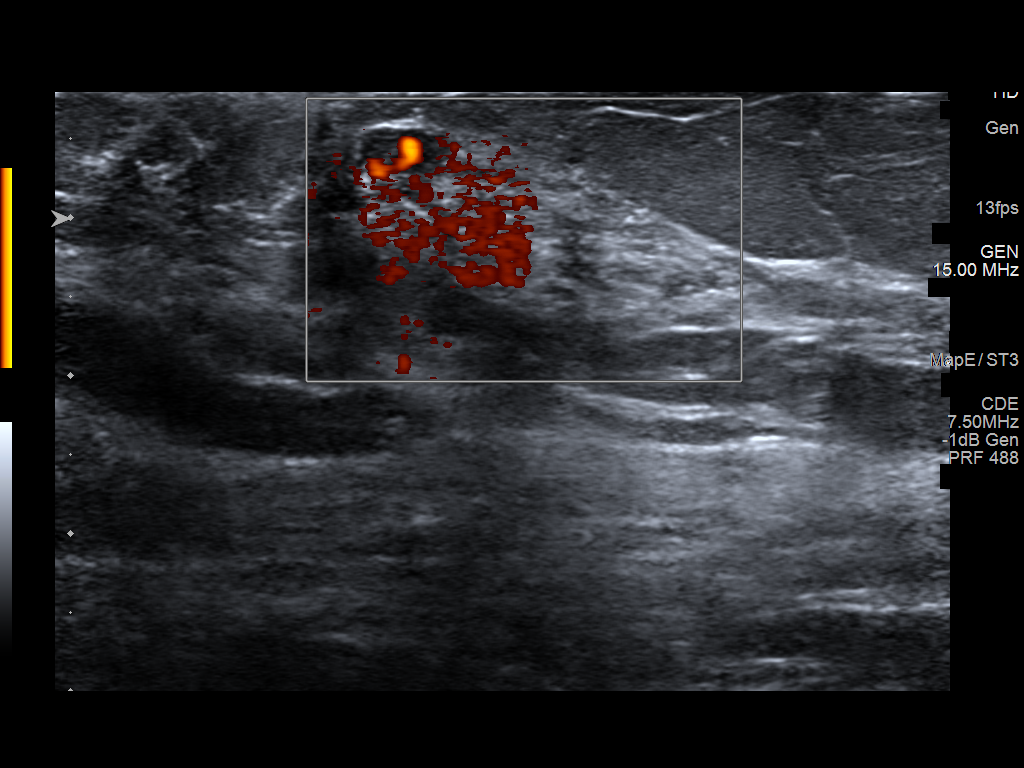

[6 of 6 positions shown; findings below may reference images not displayed]

FINDINGS: Targeted ultrasound of the right breast was performed demonstrating
an oval circumscribed hypoechoic mass at the 10 o'clock position
periareolar measuring 0.7 x 0.5 x 0.8 cm. This is felt to correspond
with the mass seen in the right breast at the 10 o'clock position on
recent MRI. A sonographic correlate could not definitely be found
for the suspicious enhancement in the right breast at the 2 o'clock
position.

Targeted ultrasound of the left breast was performed demonstrating
an oval circumscribed hypoechoic mass 12 o'clock retroareolar
measuring 0.5 x 0.4 x 0.6 cm. This is felt to correspond well with
the mass seen in the left breast at the [DATE] position on recent
MRI.
IMPRESSION: 1. Indeterminate mass in the right breast at the 10 o'clock position
seen sonographically felt to correspond with the mass seen in the
right breast on recent breast MRI.

2. No sonographic correlate for the suspicious enhancement in the
right breast at the 2 o'clock position on recent MRI.

3. Indeterminate mass in the left breast at the 12 o'clock position
seen sonographically felt to correspond with the mass seen in the
left breast at the [DATE] position on recent MRI.

RECOMMENDATION:
1. Ultrasound-guided biopsy of the mass in the right breast at the
10 o'clock position and ultrasound-guided biopsy of the mass in the
left breast at the 12 o'clock position is recommended and will be
subsequently performed and dictated separately.

2. MRI guided biopsy of the suspicious area of enhancement in the
right breast seen on recent MRI is recommended. This was discussed
with the patient.

I have discussed the findings and recommendations with the patient.
Results were also provided in writing at the conclusion of the
visit. If applicable, a reminder letter will be sent to the patient
regarding the next appointment.

BI-RADS CATEGORY  4: Suspicious.

## 2020-02-21 ENCOUNTER — Other Ambulatory Visit: Payer: Self-pay | Admitting: *Deleted

## 2020-02-21 MED ORDER — FREESTYLE LITE TEST VI STRP: ORAL_STRIP | 1 refills | Status: DC

## 2020-02-28 ENCOUNTER — Ambulatory Visit
Admission: RE | Admit: 2020-02-28 | Discharge: 2020-02-28 | Disposition: A | Discharge status: Home / Self Care | Payer: HMO | Source: Ambulatory Visit | Attending: Adult Health | Admitting: Adult Health

## 2020-02-28 ENCOUNTER — Other Ambulatory Visit: Payer: Self-pay

## 2020-02-28 DIAGNOSIS — D0511 Intraductal carcinoma in situ of right breast: Secondary | ICD-10-CM

## 2020-02-28 DIAGNOSIS — D0512 Intraductal carcinoma in situ of left breast: Secondary | ICD-10-CM

## 2020-02-28 DIAGNOSIS — R922 Inconclusive mammogram: Secondary | ICD-10-CM | POA: Diagnosis not present

## 2020-03-05 ENCOUNTER — Ambulatory Visit (HOSPITAL_BASED_OUTPATIENT_CLINIC_OR_DEPARTMENT_OTHER)
Admission: RE | Admit: 2020-03-05 | Discharge: 2020-03-05 | Disposition: A | Discharge status: Home / Self Care | Payer: HMO | Source: Ambulatory Visit | Attending: Medical | Admitting: Medical

## 2020-03-05 ENCOUNTER — Other Ambulatory Visit: Payer: Self-pay

## 2020-03-05 ENCOUNTER — Ambulatory Visit (INDEPENDENT_AMBULATORY_CARE_PROVIDER_SITE_OTHER): Payer: HMO | Admitting: Medical

## 2020-03-05 VITALS — BP 134/64 | HR 74 | Temp 98.1°F | Resp 18 | Ht 63.0 in | Wt 190.0 lb

## 2020-03-05 DIAGNOSIS — E785 Hyperlipidemia, unspecified: Secondary | ICD-10-CM | POA: Diagnosis not present

## 2020-03-05 DIAGNOSIS — M25562 Pain in left knee: Secondary | ICD-10-CM

## 2020-03-05 DIAGNOSIS — R5383 Other fatigue: Secondary | ICD-10-CM

## 2020-03-05 DIAGNOSIS — K760 Fatty (change of) liver, not elsewhere classified: Secondary | ICD-10-CM

## 2020-03-05 DIAGNOSIS — R1011 Right upper quadrant pain: Secondary | ICD-10-CM

## 2020-03-05 DIAGNOSIS — E1169 Type 2 diabetes mellitus with other specified complication: Secondary | ICD-10-CM

## 2020-03-05 MED ORDER — FREESTYLE LITE DEVI: 0 refills | Status: DC

## 2020-03-05 MED FILL — FREESTYLE LITE METER: W/DEVICE | 30 days supply | Qty: 1 | Fill #0

## 2020-03-05 NOTE — Patient Instructions (Signed)
For your recent fatigue, I placed lab orders to include TSH, T4, B12, B1, vitamin D and CBC.  For history of fatty liver and moderate to severe right upper quadrant pain mid summer, I do think is a good idea to get ultrasound today. We will see if any gallbladder abnormalities noted. For the fatty liver recommend low-fat diet, avoid excess fructose, alcohol, excess Tylenol and try to control weight.  For left knee pain, did place left knee x-ray.  For hyperlipidemia placing order for CMP and lipid panel.  I want you to get scheduled for all labs are fasting. So try to get scheduled for tomorrow or Friday.  Follow-up date to be determined after lab review.

## 2020-03-05 NOTE — Progress Notes (Signed)
Subjective:    Patient ID: [name], female    DOB: [birth year], 69 y.o.   MRN: [id number]  HPI  Pt in for follow up.  Pt gives me update that she has dull rt upper quadrant pain daily. Back in mid summer pain was moderate to severe. Work up was negative except for fatty liver.  Pt lipase was not elevated. Pt liver not elevated.  Pt does have gallbadder. Day in ED [place] machine was not working.   Pt told to take protonix. She was on omeprazole. Pt never started protonix. She states really does not have reflux type symptoms.  Pt had low gfr and mild increase CR.  Pt diabetic and sees Dr. Kelton Pillar. Pt will see her end of October.  Pt had covid vaccines. Last vaccine march.  Pt has high cholesterol. Pt is on crestor.  Pt has left knee pain. Hurts when she stands from a chair. Hurts to walk on treadmill. Pt states hears crepitus.     Review of Systems  Cardiovascular: Negative for chest pain and palpitations.  Gastrointestinal: Positive for abdominal pain. Negative for abdominal distention, constipation, rectal pain and vomiting.  Musculoskeletal: Negative for back pain.       Left knee pain.  Skin: Negative for rash.  Hematological: Negative for adenopathy. Does not bruise/bleed easily.  Psychiatric/Behavioral: Negative for behavioral problems, decreased concentration and dysphoric mood. The patient is not nervous/anxious.     Past Medical History:  Diagnosis Date  . Acute bronchitis 10/31/2015  . Allergy    seasonal  . Arthritis    knee-left  . Cancer ([place]) 04/2018   right breast ca  . Cataract    right eye  . Diabetes mellitus 69   type 2  . Diaphragmatic hernia without mention of obstruction or gangrene   . Endometriosis   . Esophageal reflux   . Esophageal stricture   . GERD (gastroesophageal reflux disease)   . Hyperlipidemia   . Hyperplastic colon polyp   . Hypertension 69  . Nonalcoholic fatty liver disease 04/28/2010   Qualifier: Diagnosis of   By: Arnoldo Morale MD, Balinda Quails   . Nonspecific abnormal results of liver function study   . Personal history of radiation therapy 08/2018   bilateral breasts  . Pharyngitis 02/18/2012  . Plantar fascial fibromatosis   . Preventative health care 01/22/2013  . Seasonal allergies 10/31/2015  . Sleep apnea    has oral appliance but does not fit well due to missing teeth  . Sore throat 04/03/2017  . Tubular adenoma of colon   . Unspecified sleep apnea    no c-pap     Social History   Socioeconomic History  . Marital status: Married    Spouse name: [name]  . Number of children: 2  . Years of education: Not on file  . Highest education level: Not on file  Occupational History  . Occupation: Building surveyor: [employer]  Tobacco Use  . Smoking status: Never Smoker  . Smokeless tobacco: Never Used  Substance and Sexual Activity  . Alcohol use: No    Alcohol/week: 0.0 standard drinks  . Drug use: No  . Sexual activity: Yes    Birth control/protection: Post-menopausal    Comment: lives with husband, no dietary restrictions just watching carbs, wears seat belt  Other Topics Concern  . Not on file  Social History Narrative  . Not on file   Social Determinants of Health   Financial Resource Strain:   .  Difficulty of Paying Living Expenses: Not on file  Food Insecurity: No Food Insecurity  . Worried About Charity fundraiser in the Last Year: Never true  . Ran Out of Food in the Last Year: Never true  Transportation Needs: No Transportation Needs  . Lack of Transportation (Medical): No  . Lack of Transportation (Non-Medical): No  Physical Activity:   . Days of Exercise per Week: Not on file  . Minutes of Exercise per Session: Not on file  Stress:   . Feeling of Stress : Not on file  Social Connections:   . Frequency of Communication with Friends and Family: Not on file  . Frequency of Social Gatherings with Friends and Family: Not on file  . Attends Religious Services: Not  on file  . Active Member of Clubs or Organizations: Not on file  . Attends Archivist Meetings: Not on file  . Marital Status: Not on file  Intimate Partner Violence:   . Fear of Current or Ex-Partner: Not on file  . Emotionally Abused: Not on file  . Physically Abused: Not on file  . Sexually Abused: Not on file    Past Surgical History:  Procedure Laterality Date  . ABDOMINAL HYSTERECTOMY  1982   partial  . APPENDECTOMY    . 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 BREAST BIOPSY Bilateral 04/03/2018  . BREAST BIOPSY Bilateral 03/23/2018  . BREAST CYST INCISION AND DRAINAGE     right breast  . BREAST EXCISIONAL BIOPSY Right 2019  . BREAST LUMPECTOMY Bilateral 04/2018  . BREAST LUMPECTOMY WITH RADIOACTIVE SEED LOCALIZATION Bilateral 05/02/2018   Procedure: RIGHT BREAST SEED LUMPECTOMY X2 AND LEFT BREAST SEED GUIDED LUMPECTOMY;  Surgeon: [name], MD;  Location: [facility];  Service: General;  Laterality: Bilateral;  . CATARACT EXTRACTION     right eye  . HERNIA REPAIR     per pt, she is not aware of this surgery  . RE-EXCISION OF BREAST LUMPECTOMY Right 05/25/2018   Procedure: RE-EXCISION OF RIGHT BREAST LUMPECTOMY;  Surgeon: [name], MD;  Location: [facility];  Service: General;  Laterality: Right;  . seba    . TUBAL LIGATION      Family History  Problem Relation Age of Onset  . Breast cancer Mother   . Hypertension Mother   . Diabetes Mother        type 2  . Other Mother        esophagus surgery  . Heart disease Father   . Hyperlipidemia Father   . Hypertension Father   . Heart attack Father        MI at age 47  . Diabetes Brother        type 2  . Heart disease Maternal Grandmother   . Heart attack Maternal Grandfather   . Hypertension Maternal Grandfather   . Stroke Paternal Grandmother   . Hypertension Sister   . Diabetes Sister   . Hypertension Brother   . Alcohol abuse Maternal Aunt   . Cancer Maternal  Aunt   . Colon cancer Neg Hx   . Stomach cancer Neg Hx   . Esophageal cancer Neg Hx     Allergies  Allergen Reactions  . Pravastatin Other (See Comments)    Myalgia, fatigue, memory loss  . Amoxicillin Itching    REACTION: rash  . Atorvastatin Other (See Comments)    Myalgia, fatigue, memory loss  . Codeine Nausea And Vomiting  Current Outpatient Medications on File Prior to Visit  Medication Sig Dispense Refill  . albuterol (VENTOLIN HFA) 108 (90 Base) MCG/ACT inhaler Inhale 2 puffs into the lungs every 6 (six) hours as needed for wheezing or shortness of breath. 1 Inhaler 0  . ASPIR-LOW 81 MG EC tablet TAKE 1 TABLET BY MOUTH DAILY 30 tablet 3  . bisoprolol-hydrochlorothiazide (ZIAC) 5-6.25 MG tablet TAKE 1 TABLET BY MOUTH ONCE DAILY 90 tablet 1  . diclofenac Sodium (VOLTAREN) 1 % GEL Apply 2 g topically 4 (four) times daily as needed. 50 g 0  . fluticasone (FLONASE) 50 MCG/ACT nasal spray Place 2 sprays into both nostrils daily. 16 g 6  . glipiZIDE (GLUCOTROL) 5 MG tablet Take 1 tablet (5 mg total) by mouth daily before breakfast AND 1.5 tablets (7.5 mg total) daily before supper. 225 tablet 3  . glucose blood (FREESTYLE LITE) test strip Check blood sugar twice daily as needed.  DX E11.9 100 each 1  . metFORMIN (GLUCOPHAGE) 1000 MG tablet Take 1 tablet (1,000 mg total) by mouth 2 (two) times daily with a meal. 180 tablet 3  . montelukast (SINGULAIR) 10 MG tablet Take 1 tablet (10 mg total) by mouth at bedtime as needed. 30 tablet 3  . omeprazole (PRILOSEC) 40 MG capsule TAKE 1 CAPSULE (40 MG TOTAL) BY MOUTH 2 (TWO) TIMES DAILY. 180 capsule 1  . pantoprazole (PROTONIX) 40 MG tablet Take 1 tablet (40 mg total) by mouth daily. (Patient not taking: Reported on 01/09/2020) 30 tablet 0  . rosuvastatin (CRESTOR) 5 MG tablet Take 1 tablet (5 mg total) by mouth daily. (Patient taking differently: Take 5 mg by mouth daily. 1/2 tab daily) 90 tablet 1   Current Facility-Administered  Medications on File Prior to Visit  Medication Dose Route Frequency Provider Last Rate Last Admin  . Chlorhexidine Gluconate Cloth 2 % PADS 6 each  6 each Topical Once [name], MD       And  . Chlorhexidine Gluconate Cloth 2 % PADS 6 each  6 each Topical Once [name], MD        BP 134/64   Pulse 74   Temp 98.1 F (36.7 C) (Oral)   Resp 18   Ht 5\' 3"  (1.6 m)   Wt 190 lb (86.2 kg)   SpO2 98%   BMI 33.66 kg/m       Objective:   Physical Exam  General Mental Status- Alert. General Appearance- Not in acute distress.   Skin General: Color- Normal Color. Moisture- Normal Moisture.  Neck Carotid Arteries- Normal color. Moisture- Normal Moisture. No carotid bruits. No JVD.  Chest and Lung Exam Auscultation: Breath Sounds:-Normal.  Cardiovascular Auscultation:Rythm- Regular. Murmurs & Other Heart Sounds:Auscultation of the heart reveals- No Murmurs.  Abdomen Inspection:-Inspeection Normal. Palpation/Percussion:Note:No mass. Palpation and Percussion of the abdomen reveal- Non Tender, Non Distended + BS, no rebound or guarding.  Neurologic Cranial Nerve exam:- CN III-XII intact(No nystagmus), symmetric smile. Strength:- 5/5 equal and symmetric strength both upper and lower extremities.   Left knee- no crepitus on rom. Mild pain on rom. No swelling or warmth.     Assessment & Plan:  For your recent fatigue, I placed lab orders to include TSH, T4, B12, B1, vitamin D and CBC.  For history of fatty liver and moderate to severe right upper quadrant pain mid summer, I do think is a good idea to get ultrasound today. We will see if any gallbladder abnormalities noted. For the fatty liver recommend  low-fat diet, avoid excess fructose, alcohol, excess Tylenol and try to control weight.  For left knee pain, did place left knee x-ray.  For hyperlipidemia placing order for CMP and lipid panel.  I want you to get scheduled for all labs are fasting. So try to get  scheduled for tomorrow or Friday.  Follow-up date to be determined after lab review.  Mackie Pai, PA-C   Time spent with patient today was 40 + minutes which consisted of chart revdew, discussing diagnoses, work up, treatment and documentation.

## 2020-03-06 ENCOUNTER — Other Ambulatory Visit (INDEPENDENT_AMBULATORY_CARE_PROVIDER_SITE_OTHER): Payer: HMO

## 2020-03-06 ENCOUNTER — Ambulatory Visit (INDEPENDENT_AMBULATORY_CARE_PROVIDER_SITE_OTHER): Payer: HMO

## 2020-03-06 DIAGNOSIS — R5383 Other fatigue: Secondary | ICD-10-CM

## 2020-03-06 DIAGNOSIS — R1011 Right upper quadrant pain: Secondary | ICD-10-CM | POA: Diagnosis not present

## 2020-03-06 DIAGNOSIS — E785 Hyperlipidemia, unspecified: Secondary | ICD-10-CM | POA: Diagnosis not present

## 2020-03-06 DIAGNOSIS — K760 Fatty (change of) liver, not elsewhere classified: Secondary | ICD-10-CM | POA: Diagnosis not present

## 2020-03-06 DIAGNOSIS — E1169 Type 2 diabetes mellitus with other specified complication: Secondary | ICD-10-CM | POA: Diagnosis not present

## 2020-03-07 MED FILL — glipiZIDE 5 MG TABS: 5 | 90 days supply | Qty: 225 | Fill #1

## 2020-03-10 LAB — COMPREHENSIVE METABOLIC PANEL
AG Ratio: 1.5 (calc) (ref 1.0–2.5)
ALT: 20 U/L (ref 6–29)
AST: 15 U/L (ref 10–35)
Albumin: 4.1 g/dL (ref 3.6–5.1)
Alkaline phosphatase (APISO): 76 U/L (ref 37–153)
BUN/Creatinine Ratio: 18 (calc) (ref 6–22)
BUN: 19 mg/dL (ref 7–25)
CO2: 26 mmol/L (ref 20–32)
Calcium: 9.4 mg/dL (ref 8.6–10.4)
Chloride: 104 mmol/L (ref 98–110)
Creat: 1.06 mg/dL — ABNORMAL HIGH (ref 0.50–0.99)
Globulin: 2.7 g/dL (calc) (ref 1.9–3.7)
Glucose, Bld: 178 mg/dL — ABNORMAL HIGH (ref 65–99)
Potassium: 4.2 mmol/L (ref 3.5–5.3)
Sodium: 140 mmol/L (ref 135–146)
Total Bilirubin: 0.3 mg/dL (ref 0.2–1.2)
Total Protein: 6.8 g/dL (ref 6.1–8.1)

## 2020-03-10 LAB — CBC WITH DIFFERENTIAL/PLATELET
Absolute Monocytes: 367 cells/uL (ref 200–950)
Basophils Absolute: 61 cells/uL (ref 0–200)
Basophils Relative: 1.3 %
Eosinophils Absolute: 61 cells/uL (ref 15–500)
Eosinophils Relative: 1.3 %
HCT: 38 % (ref 35.0–45.0)
Hemoglobin: 12.4 g/dL (ref 11.7–15.5)
Lymphs Abs: 1645 cells/uL (ref 850–3900)
MCH: 25.9 pg — ABNORMAL LOW (ref 27.0–33.0)
MCHC: 32.6 g/dL (ref 32.0–36.0)
MCV: 79.5 fL — ABNORMAL LOW (ref 80.0–100.0)
MPV: 10.6 fL (ref 7.5–12.5)
Monocytes Relative: 7.8 %
Neutro Abs: 2566 cells/uL (ref 1500–7800)
Neutrophils Relative %: 54.6 %
Platelets: 215 10*3/uL (ref 140–400)
RBC: 4.78 10*6/uL (ref 3.80–5.10)
RDW: 13.4 % (ref 11.0–15.0)
Total Lymphocyte: 35 %
WBC: 4.7 10*3/uL (ref 3.8–10.8)

## 2020-03-10 LAB — VITAMIN D 1,25 DIHYDROXY
Vitamin D 1, 25 (OH)2 Total: 25 pg/mL (ref 18–72)
Vitamin D2 1, 25 (OH)2: <8 pg/mL
Vitamin D3 1, 25 (OH)2: 25 pg/mL

## 2020-03-10 LAB — T4, FREE: Free T4: 0.9 ng/dL (ref 0.8–1.8)

## 2020-03-10 LAB — LIPASE: Lipase: 76 U/L — ABNORMAL HIGH (ref 7–60)

## 2020-03-10 LAB — LIPID PANEL
Cholesterol: 148 mg/dL (ref <200)
HDL: 40 mg/dL — ABNORMAL LOW (ref >=50)
LDL Cholesterol (Calc): 85 mg/dL (calc)
Non-HDL Cholesterol (Calc): 108 mg/dL (calc) (ref <130)
Total CHOL/HDL Ratio: 3.7 (calc) (ref <5.0)
Triglycerides: 133 mg/dL (ref <150)

## 2020-03-10 LAB — VITAMIN B12: Vitamin B-12: 401 pg/mL (ref 200–1100)

## 2020-03-10 LAB — TSH: TSH: 1.58 mIU/L (ref 0.40–4.50)

## 2020-03-12 LAB — VITAMIN B1: Vitamin B1 (Thiamine): 8 nmol/L (ref 8–30)

## 2020-03-16 IMAGING — MG MM CLIP PLACEMENT
2 series · 2 of 2 positions shown · non-contrast
Comparison: Previous exam(s).

CLINICAL DATA: Ultrasound-guided radioactive seed localization was
performed of a left breast retroareolar mass at the 12 to [DATE]
position. Ribbon shaped biopsy clip was placed at the time of
ultrasound-guided biopsy. Pathology results showed ductal carcinoma
in situ.

EXAM:
DIAGNOSTIC LEFT MAMMOGRAM POST ULTRASOUND-GUIDED RADIOACTIVE SEED
LOCALIZATION

[L ML]
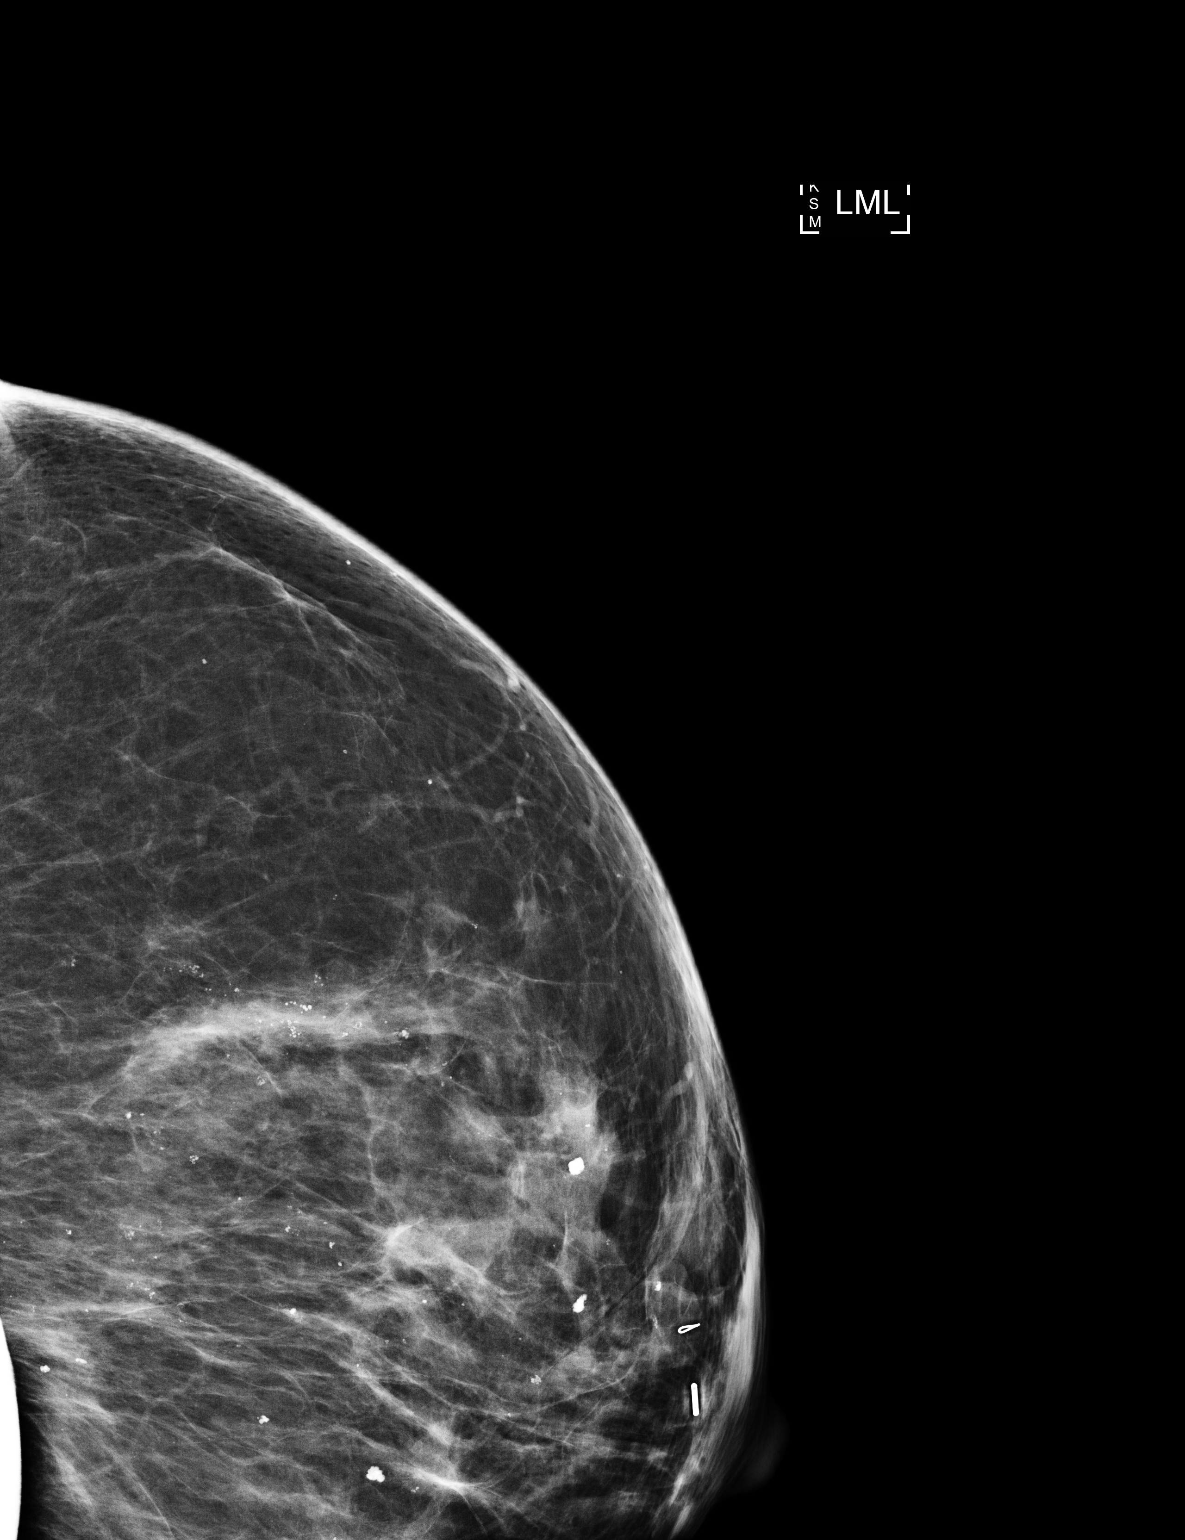

[L CC]
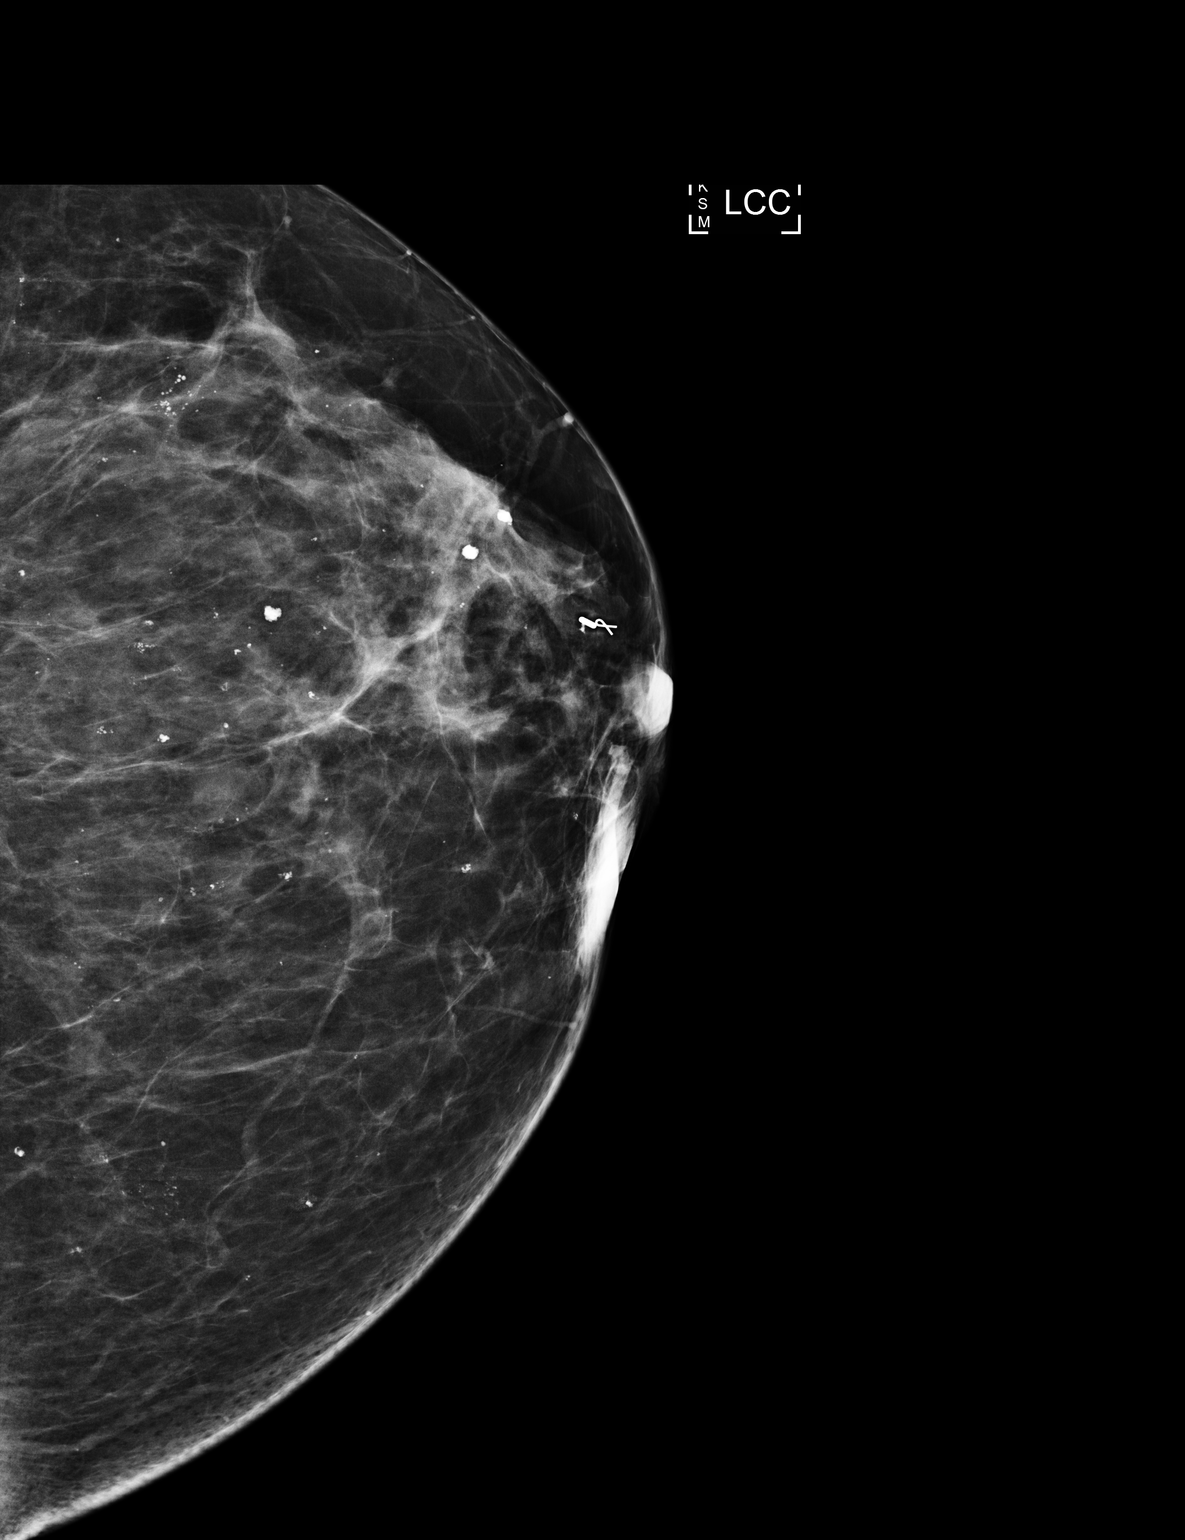

[2 of 2 positions shown; findings below may reference images not displayed]

FINDINGS: Mammographic images were obtained following ULTRASOUND guided biopsy
radioactive seed localization. The radioactive seed is approximately
6 mm directly inferior to the ribbon shaped biopsy clip, in
satisfactory position.
IMPRESSION: Satisfactory position of radioactive seed in the retroareolar left
breast.

Final Assessment: Post Procedure Mammograms for Marker Placement

## 2020-04-08 ENCOUNTER — Other Ambulatory Visit: Payer: Self-pay

## 2020-04-08 ENCOUNTER — Ambulatory Visit: Payer: HMO | Attending: Internal Medicine

## 2020-04-08 ENCOUNTER — Encounter: Payer: Self-pay | Admitting: Internal Medicine

## 2020-04-08 ENCOUNTER — Other Ambulatory Visit (HOSPITAL_BASED_OUTPATIENT_CLINIC_OR_DEPARTMENT_OTHER): Payer: Self-pay | Admitting: Internal Medicine

## 2020-04-08 ENCOUNTER — Other Ambulatory Visit: Payer: Self-pay | Admitting: Family Medicine

## 2020-04-08 ENCOUNTER — Ambulatory Visit (INDEPENDENT_AMBULATORY_CARE_PROVIDER_SITE_OTHER): Payer: HMO | Admitting: Internal Medicine

## 2020-04-08 VITALS — BP 130/82 | HR 88 | Ht 63.0 in | Wt 190.1 lb

## 2020-04-08 DIAGNOSIS — E1165 Type 2 diabetes mellitus with hyperglycemia: Secondary | ICD-10-CM

## 2020-04-08 DIAGNOSIS — Z23 Encounter for immunization: Secondary | ICD-10-CM

## 2020-04-08 LAB — POCT GLYCOSYLATED HEMOGLOBIN (HGB A1C): Hemoglobin A1C: 8.3 % — AB (ref 4.0–5.6)

## 2020-04-08 MED ORDER — GLIPIZIDE 5 MG PO TABS
ORAL_TABLET | ORAL | 3 refills | Status: DC
Start: 2020-04-08 — End: 2020-04-08

## 2020-04-08 MED FILL — BISOPROLOL-HCTZ 5-6.25 MG T: 5-6.25 | 90 days supply | Qty: 90 | Fill #1

## 2020-04-08 MED FILL — METFORMIN HCL 1000 MG TABS: 1000 | 90 days supply | Qty: 180 | Fill #1

## 2020-04-08 MED FILL — OMEPRAZOLE 40 MG CPDR: 40 | 90 days supply | Qty: 180 | Fill #0

## 2020-04-08 NOTE — Progress Notes (Signed)
   Covid-19 Vaccination Clinic  Name:  [name]    MRN: [id number] DOB: [birth year]  04/08/2020  [name] was observed post Covid-19 immunization for 15 minutes without incident. She was provided with Vaccine Information Sheet and instruction to access the V-Safe system. Vaccinated by [employer].  [name] was instructed to call 911 with any severe reactions post vaccine: Marland Kitchen Difficulty breathing  . Swelling of face and throat  . A fast heartbeat  . A bad rash all over body  . Dizziness and weakness

## 2020-04-08 NOTE — Progress Notes (Signed)
Name: [name]  Age/ Sex: 69 y.o., female   MRN/ DOB: [id number], 1950-09-04     PCP: Mosie Lukes, MD   Reason for Endocrinology Evaluation: Type 2 Diabetes Mellitus  Initial Endocrine Consultative Visit: 02/22/2017    PATIENT IDENTIFIER: [name] is a 69 y.o. female with a past medical history of T2DM, HTN , Dyslipidemia, Hx of breast Ca ( S/P right lumpectomy) . The patient has followed with Endocrinology clinic since 02/16/2019 for consultative assistance with management of her diabetes.  DIABETIC HISTORY:  [name] was diagnosed with T2DM in 2001. She has been on metformin, Glimepiride and Januvia. Invokana added in 2018 but was cost prohibitive. Her hemoglobin A1c has ranged from 7.0% in 2016, peaking at 8.8% in  2021.    She used to see Dr.Gherghe but was lost to follow up by 2019 , until her return to our clinic 08/2019.   SUBJECTIVE:   During the last visit (12/05/2019):  A1c 8.0%. Increased Glipizide and continued Metformin   Today (04/08/2020): Ms. [name] is here for a follow up on diabetes management.  She checks her blood sugars 2 times daily The patient has not had hypoglycemic episodes since the last clinic visit.    She has side pain and does not wait 30 minutes before eating  Denies diarrhea    HOME DIABETES REGIMEN:  Metformin 1000 mg, BID Glipizide 5 mg , 1 tablet before breakfast and 1.5 tabs before dinner      Statin:  Crestor ACE-I/ARB: No    METER DOWNLOAD SUMMARY: unable to download BG 71-263  mg/dL    DIABETIC COMPLICATIONS: Microvascular complications:    Denies: CKD, retinopathy , neuropathy  Last Eye Exam: Completed 12/2019  Macrovascular complications:    Denies: CAD, CVA, PVD   HISTORY:  Past Medical History:  Past Medical History:  Diagnosis Date  . Acute bronchitis 10/31/2015  . Allergy    seasonal  . Arthritis    knee-left  . Cancer ([place]) 04/2018   right breast ca  . Cataract    right  eye  . Diabetes mellitus 52   type 2  . Diaphragmatic hernia without mention of obstruction or gangrene   . Endometriosis   . Esophageal reflux   . Esophageal stricture   . GERD (gastroesophageal reflux disease)   . Hyperlipidemia   . Hyperplastic colon polyp   . Hypertension 52  . Nonalcoholic fatty liver disease 04/28/2010   Qualifier: Diagnosis of  By: Arnoldo Morale MD, Balinda Quails   . Nonspecific abnormal results of liver function study   . Personal history of radiation therapy 08/2018   bilateral breasts  . Pharyngitis 02/18/2012  . Plantar fascial fibromatosis   . Preventative health care 01/22/2013  . Seasonal allergies 10/31/2015  . Sleep apnea    has oral appliance but does not fit well due to missing teeth  . Sore throat 04/03/2017  . Tubular adenoma of colon   . Unspecified sleep apnea    no c-pap   Past Surgical History:  Past Surgical History:  Procedure Laterality Date  . ABDOMINAL HYSTERECTOMY  1982   partial  . APPENDECTOMY    . 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 BREAST BIOPSY Bilateral 04/03/2018  . BREAST BIOPSY Bilateral 03/23/2018  . BREAST CYST INCISION AND DRAINAGE     right breast  . BREAST EXCISIONAL BIOPSY Right 2019  . BREAST LUMPECTOMY Bilateral 04/2018  . BREAST LUMPECTOMY  WITH RADIOACTIVE SEED LOCALIZATION Bilateral 05/02/2018   Procedure: RIGHT BREAST SEED LUMPECTOMY X2 AND LEFT BREAST SEED GUIDED LUMPECTOMY;  Surgeon: [name], MD;  Location: [facility];  Service: General;  Laterality: Bilateral;  . CATARACT EXTRACTION     right eye  . HERNIA REPAIR     per pt, she is not aware of this surgery  . RE-EXCISION OF BREAST LUMPECTOMY Right 05/25/2018   Procedure: RE-EXCISION OF RIGHT BREAST LUMPECTOMY;  Surgeon: [name], MD;  Location: [facility];  Service: General;  Laterality: Right;  . seba    . TUBAL LIGATION      Social History:  reports that she has never smoked. She has never used smokeless tobacco. She  reports that she does not drink alcohol and does not use drugs. Family History:  Family History  Problem Relation Age of Onset  . Breast cancer Mother   . Hypertension Mother   . Diabetes Mother        type 2  . Other Mother        esophagus surgery  . Heart disease Father   . Hyperlipidemia Father   . Hypertension Father   . Heart attack Father        MI at age 64  . Diabetes Brother        type 2  . Heart disease Maternal Grandmother   . Heart attack Maternal Grandfather   . Hypertension Maternal Grandfather   . Stroke Paternal Grandmother   . Hypertension Sister   . Diabetes Sister   . Hypertension Brother   . Alcohol abuse Maternal Aunt   . Cancer Maternal Aunt   . Colon cancer Neg Hx   . Stomach cancer Neg Hx   . Esophageal cancer Neg Hx      HOME MEDICATIONS: Allergies as of 04/08/2020      Reactions   Pravastatin Other (See Comments)   Myalgia, fatigue, memory loss   Amoxicillin Itching   REACTION: rash   Atorvastatin Other (See Comments)   Myalgia, fatigue, memory loss   Codeine Nausea And Vomiting      Medication List       Accurate as of April 08, 2020  3:41 PM. If you have any questions, ask your nurse or doctor.        albuterol 108 (90 Base) MCG/ACT inhaler Commonly known as: VENTOLIN HFA Inhale 2 puffs into the lungs every 6 (six) hours as needed for wheezing or shortness of breath.   Aspir-Low 81 MG EC tablet Generic drug: aspirin TAKE 1 TABLET BY MOUTH DAILY   bisoprolol-hydrochlorothiazide 5-6.25 MG tablet Commonly known as: ZIAC TAKE 1 TABLET BY MOUTH ONCE DAILY   diclofenac Sodium 1 % Gel Commonly known as: Voltaren Apply 2 g topically 4 (four) times daily as needed.   fluticasone 50 MCG/ACT nasal spray Commonly known as: FLONASE Place 2 sprays into both nostrils daily.   FreeStyle Lite Devi Please dispense Freestyle Lite Glucometer.  DX  E11.9   FREESTYLE LITE test strip Generic drug: glucose blood Check blood sugar  twice daily as needed.  DX E11.9   glipiZIDE 5 MG tablet Commonly known as: GLUCOTROL Take 1 tablet (5 mg total) by mouth daily before breakfast AND 1.5 tablets (7.5 mg total) daily before supper.   metFORMIN 1000 MG tablet Commonly known as: GLUCOPHAGE Take 1 tablet (1,000 mg total) by mouth 2 (two) times daily with a meal.   montelukast 10 MG tablet Commonly known  as: SINGULAIR Take 1 tablet (10 mg total) by mouth at bedtime as needed.   omeprazole 40 MG capsule Commonly known as: PRILOSEC TAKE 1 CAPSULE BY MOUTH TWICE DAILY   pantoprazole 40 MG tablet Commonly known as: Protonix Take 1 tablet (40 mg total) by mouth daily.   rosuvastatin 5 MG tablet Commonly known as: Crestor Take 1 tablet (5 mg total) by mouth daily. What changed: additional instructions        OBJECTIVE:   Vital Signs: BP 130/82   Pulse 88   Ht 5\' 3"  (1.6 m)   Wt 190 lb 2 oz (86.2 kg)   SpO2 98%   BMI 33.68 kg/m   Wt Readings from Last 3 Encounters:  04/08/20 190 lb 2 oz (86.2 kg)  03/05/20 190 lb (86.2 kg)  12/12/19 190 lb (86.2 kg)     Exam: General: Pt appears well and is in NAD  Lungs: Clear with good BS bilat with no rales, rhonchi, or wheezes  Heart: RRR with normal S1 and S2 and no gallops; no murmurs; no rub  Skin: Cord-like thickening ~ 2.5 cm in length just above the right breast  Abdomen: Normoactive bowel sounds, soft, nontender, without masses or organomegaly palpable  Extremities: No pretibial edema.   Neuro: MS is good with appropriate affect, pt is alert and Ox3     DM foot exam 12/05/2019  The skin of the feet is intact without sores or ulcerations. The pedal pulses are 2+ on right and 2+ on left. The sensation is intact to a screening 5.07, 10 gram monofilament bilaterally  DATA REVIEWED:  Lab Results  Component Value Date   HGBA1C 8.0 (A) 12/05/2019   HGBA1C 8.8 (H) 08/08/2019   HGBA1C 8.4 (H) 03/05/2019   Lab Results  Component Value Date   MICROALBUR  <0.7 12/04/2015   LDLCALC 85 03/06/2020   CREATININE 1.06 (H) 03/06/2020   Lab Results  Component Value Date   MICRALBCREAT 1.0 12/04/2015     Lab Results  Component Value Date   CHOL 148 03/06/2020   HDL 40 (L) 03/06/2020   LDLCALC 85 03/06/2020   LDLDIRECT 82.0 03/05/2019   TRIG 133 03/06/2020   CHOLHDL 3.7 03/06/2020         ASSESSMENT / PLAN / RECOMMENDATIONS:   1) Type 2 Diabetes Mellitus, Poorly  controlled, [place] complications - Most recent A1c of 8.3 %. Goal A1c < 7.0 %.      - A1c is stable  - In review of her glucose meter, her Bg's appear higher at night rather than during the day, I am going to increase her evening dose of glipizide.   MEDICATIONS:  - Continue Metformin 1000 mg , 1 tablet with breakfast and dinner - Increase  Glipizide 5 mg, 1.5 tablet before breakfast and 2 tablets before dinner    EDUCATION / INSTRUCTIONS:  BG monitoring instructions: Patient is instructed to check her blood sugars 2 times a day, fasting and supper time.  Call [facility] if: BG persistently < 70  . I reviewed the Rule of 15 for the treatment of hypoglycemia in detail with the patient. Literature supplied.    2) Diabetic complications:   Eye: Does not have known diabetic retinopathy.   Neuro/ Feet: Does not have known diabetic peripheral neuropathy .   Renal: Patient does not have known baseline CKD. She   is not on an ACEI/ARB at present.    3) Dyslipidemia: Patient is on crestor 5 mg,  taking half a tablet , this is all she could tolerate due to arthralgia. She is already on vitamin D. LDL at 85 mg/dL.     F/U in 3 months   Signed electronically by: Mack Guise, MD  [facility]  [facility] [address] [place], [place] [address] Phone: [phone] FAX: [phone]   CC: Mosie Lukes, [address] [place] [address] Phone: [phone]  Fax:  [phone]  Return to Endocrinology clinic as below: Future Appointments  Date Time Provider [place]  04/16/2020  1:30 PM Magrinat, Virgie Dad, MD [facility] None  06/30/2020 10:00 AM Dannielle Karvonen, RN [facility] None

## 2020-04-08 NOTE — Patient Instructions (Signed)
-   Continue Metformin 1000 mg , 1 tablet with breakfast and 1 tablet with dinner - Increase  Glipizide 5 mg, 1.5 tablet before breakfast and 2 tablets before  dinner        HOW TO TREAT LOW BLOOD SUGARS (Blood sugar LESS THAN 70 MG/DL)  Please follow the RULE OF 15 for the treatment of hypoglycemia treatment (when your (blood sugars are less than 70 mg/dL)    STEP 1: Take 15 grams of carbohydrates when your blood sugar is low, which includes:   3-4 GLUCOSE TABS  OR  3-4 OZ OF JUICE OR REGULAR SODA OR  ONE TUBE OF GLUCOSE GEL     STEP 2: RECHECK blood sugar in 15 MINUTES STEP 3: If your blood sugar is still low at the 15 minute recheck --> then, go back to STEP 1 and treat AGAIN with another 15 grams of carbohydrates.

## 2020-04-09 ENCOUNTER — Ambulatory Visit: Payer: HMO | Admitting: Internal Medicine

## 2020-04-11 DIAGNOSIS — Z01419 Encounter for gynecological examination (general) (routine) without abnormal findings: Secondary | ICD-10-CM | POA: Diagnosis not present

## 2020-04-11 DIAGNOSIS — Z6833 Body mass index (BMI) 33.0-33.9, adult: Secondary | ICD-10-CM | POA: Diagnosis not present

## 2020-04-11 DIAGNOSIS — N952 Postmenopausal atrophic vaginitis: Secondary | ICD-10-CM | POA: Diagnosis not present

## 2020-04-11 DIAGNOSIS — N898 Other specified noninflammatory disorders of vagina: Secondary | ICD-10-CM | POA: Diagnosis not present

## 2020-04-11 DIAGNOSIS — D0510 Intraductal carcinoma in situ of unspecified breast: Secondary | ICD-10-CM | POA: Diagnosis not present

## 2020-04-11 DIAGNOSIS — D649 Anemia, unspecified: Secondary | ICD-10-CM | POA: Diagnosis not present

## 2020-04-11 DIAGNOSIS — Z124 Encounter for screening for malignant neoplasm of cervix: Secondary | ICD-10-CM | POA: Diagnosis not present

## 2020-04-15 LAB — HM PAP SMEAR: HM Pap smear: NEGATIVE

## 2020-04-15 MED FILL — PFIZER-BIONTECH COVID-19 VA: 30 | 1 days supply | Qty: 0 | Fill #0

## 2020-04-16 ENCOUNTER — Other Ambulatory Visit: Payer: Self-pay

## 2020-04-16 ENCOUNTER — Inpatient Hospital Stay: Payer: HMO | Attending: Oncology | Admitting: Oncology

## 2020-04-16 VITALS — BP 127/72 | HR 77 | Temp 97.4°F | Resp 18 | Wt 189.1 lb

## 2020-04-16 DIAGNOSIS — D0511 Intraductal carcinoma in situ of right breast: Secondary | ICD-10-CM | POA: Insufficient documentation

## 2020-04-16 DIAGNOSIS — I10 Essential (primary) hypertension: Secondary | ICD-10-CM | POA: Insufficient documentation

## 2020-04-16 DIAGNOSIS — Z79899 Other long term (current) drug therapy: Secondary | ICD-10-CM | POA: Diagnosis not present

## 2020-04-16 DIAGNOSIS — Z171 Estrogen receptor negative status [ER-]: Secondary | ICD-10-CM | POA: Diagnosis not present

## 2020-04-16 DIAGNOSIS — E119 Type 2 diabetes mellitus without complications: Secondary | ICD-10-CM | POA: Diagnosis not present

## 2020-04-16 DIAGNOSIS — K219 Gastro-esophageal reflux disease without esophagitis: Secondary | ICD-10-CM | POA: Insufficient documentation

## 2020-04-16 DIAGNOSIS — M129 Arthropathy, unspecified: Secondary | ICD-10-CM | POA: Insufficient documentation

## 2020-04-16 DIAGNOSIS — D0512 Intraductal carcinoma in situ of left breast: Secondary | ICD-10-CM | POA: Diagnosis not present

## 2020-04-16 DIAGNOSIS — G4730 Sleep apnea, unspecified: Secondary | ICD-10-CM | POA: Diagnosis not present

## 2020-04-16 DIAGNOSIS — K760 Fatty (change of) liver, not elsewhere classified: Secondary | ICD-10-CM | POA: Diagnosis not present

## 2020-04-16 DIAGNOSIS — Z803 Family history of malignant neoplasm of breast: Secondary | ICD-10-CM | POA: Diagnosis not present

## 2020-04-16 DIAGNOSIS — Z7984 Long term (current) use of oral hypoglycemic drugs: Secondary | ICD-10-CM | POA: Insufficient documentation

## 2020-04-16 DIAGNOSIS — Z923 Personal history of irradiation: Secondary | ICD-10-CM | POA: Insufficient documentation

## 2020-04-16 DIAGNOSIS — Z86010 Personal history of colonic polyps: Secondary | ICD-10-CM | POA: Insufficient documentation

## 2020-04-16 DIAGNOSIS — E785 Hyperlipidemia, unspecified: Secondary | ICD-10-CM | POA: Diagnosis not present

## 2020-04-16 DIAGNOSIS — R1011 Right upper quadrant pain: Secondary | ICD-10-CM | POA: Insufficient documentation

## 2020-04-16 NOTE — Progress Notes (Signed)
[name]  Telephone:([phone] [phone]) [phone]    ID: [name] DOB: [birth year]  MR#: [id number]  OIZ#:[id number]  Patient Care Team: Mosie Lukes, MD as PCP - General (Family Medicine) Jola Schmidt, MD as Consulting Physician (Ophthalmology) Royston Sinner Colin Benton, MD as Consulting Physician (Obstetrics and Gynecology) [name], MD as Consulting Physician (General Surgery) Kessa Fairbairn, Virgie Dad, MD as Consulting Physician (Oncology) Philemon Kingdom, MD as Consulting Physician (Internal Medicine) Dene Gentry, MD as Consulting Physician (Sports Medicine) Dannielle Karvonen, RN as [facility] OTHER MD:   CHIEF COMPLAINT: Estrogen receptor negative ductal carcinoma in situ  CURRENT TREATMENT: Observation   INTERVAL HISTORY: [name] returns today for follow-up of her estrogen receptor negative ductal carcinoma in situ.   Since her last visit, she underwent bilateral diagnostic mammography with tomography at [facility] on 02/28/2020 showing: breast density category C; no evidence of malignancy in either breast.  She also presented to the ED on 01/02/2020 with chest pain and right upper quadrant pain. She underwent chest x-ray and CT abdomen/pelvis, both of which were negative.  She underwent follow up abdomen ultrasound on 03/06/2020 for continued right upper quadrant pain. This was again negative.   REVIEW OF SYSTEMS: Tonnya is getting back to some kind of normalcy.  She babysits, walks, uses her treadmill.  She is working hard to get her A1c down, most recently at 8.0 she says.  Occasionally she has pain in the right flank.  This comes and goes.  Otherwise a detailed review of systems today was noncontributory   COVID 19 VACCINATION STATUS: Status post [place] x2, and booster October 2021   HISTORY OF CURRENT ILLNESS: From the original intake note:  [name] had routine screening mammography on  02/20/2018 at [facility] showing a possible abnormality in the right breast. She underwent unilateral right diagnostic mammography with tomography and right breast ultrasonography at [facility] on 03/10/2018 showing: breast density category C. There is a cluster of heterogeneous calcifications in the right breast upper central quadrant. There was no sonographic abnormalities or lymphadenopathy in the right axilla.  Accordingly on 03/07/2018 she proceeded to biopsy of the right breast area in question. The pathology from this procedure showed ([id number]): Ductal carcinoma in situ, high grade. There are foci suspicious for early stromal invasion. Prognostic indicators significant for: both estrogen receptor and progesterone receptor, 0% negative.  The patient's subsequent history is as detailed below.   PAST MEDICAL HISTORY: Past Medical History:  Diagnosis Date   Acute bronchitis 10/31/2015   Allergy    seasonal   Arthritis    knee-left   Cancer ([place]) 04/2018   right breast ca   Cataract    right eye   Diabetes mellitus 69   type 2   Diaphragmatic hernia without mention of obstruction or gangrene    Endometriosis    Esophageal reflux    Esophageal stricture    GERD (gastroesophageal reflux disease)    Hyperlipidemia    Hyperplastic colon polyp    Hypertension 69   Nonalcoholic fatty liver disease 04/28/2010   Qualifier: Diagnosis of  By: Arnoldo Morale MD, John E    Nonspecific abnormal results of liver function study    Personal history of radiation therapy 08/2018   bilateral breasts   Pharyngitis 02/18/2012   Plantar fascial fibromatosis    Preventative health care 01/22/2013   Seasonal allergies 10/31/2015   Sleep apnea    has oral appliance but does not fit well due  to missing teeth   Sore throat 04/03/2017   Tubular adenoma of colon    Unspecified sleep apnea    no c-pap    PAST SURGICAL HISTORY: Past Surgical History:  Procedure Laterality Date   ABDOMINAL  HYSTERECTOMY  1982   partial   APPENDECTOMY     benigh cyst in lung  2006   right   BREAST BIOPSY Bilateral 04/03/2018   BREAST BIOPSY Bilateral 03/23/2018   BREAST CYST INCISION AND DRAINAGE     right breast   BREAST EXCISIONAL BIOPSY Right 2019   BREAST LUMPECTOMY Bilateral 04/2018   BREAST LUMPECTOMY WITH RADIOACTIVE SEED LOCALIZATION Bilateral 05/02/2018   Procedure: RIGHT BREAST SEED LUMPECTOMY X2 AND LEFT BREAST SEED GUIDED LUMPECTOMY;  Surgeon: [name], MD;  Location: [facility];  Service: General;  Laterality: Bilateral;   CATARACT EXTRACTION     right eye   HERNIA REPAIR     per pt, she is not aware of this surgery   RE-EXCISION OF BREAST LUMPECTOMY Right 05/25/2018   Procedure: RE-EXCISION OF RIGHT BREAST LUMPECTOMY;  Surgeon: [name], MD;  Location: [facility];  Service: General;  Laterality: Right;   seba     TUBAL LIGATION    Partial hysterectomy   FAMILY HISTORY: Family History  Problem Relation Age of Onset   Breast cancer Mother    Hypertension Mother    Diabetes Mother        type 2   Other Mother        esophagus surgery   Heart disease Father    Hyperlipidemia Father    Hypertension Father    Heart attack Father        MI at age 84   Diabetes Brother        type 2   Heart disease Maternal Grandmother    Heart attack Maternal Grandfather    Hypertension Maternal Grandfather    Stroke Paternal Grandmother    Hypertension Sister    Diabetes Sister    Hypertension Brother    Alcohol abuse Maternal Aunt    Cancer Maternal Aunt    Colon cancer Neg Hx    Stomach cancer Neg Hx    Esophageal cancer Neg Hx    The patient's father died at age 67 due to CHF. The patient's mother died at age 41 due to breast cancer diagnosed at age 32. The patient had 3 brothers and 3 sisters. There was a paternal aunt with breast cancer diagnosed in the 40's. There was also a maternal aunt with  ovarian cancer that passed away. The patient notes that she had prior genetics testing in 69 for ovarian and breast under Dr. Royston Sinner with normal results.    GYNECOLOGIC HISTORY:  No LMP recorded. Patient has had a hysterectomy. Menarche: 69 years old Age at first live birth: 69 years old She is [name].  She is status post partial hysterectomy without BSO. Her LMP: was in 56 ( late 30's). She took hormone replacement for 3 years. She also took oral contraception for 3 years with no complications.    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 Her husband, [name] "Dexter" works in transportation and also worked as a Dealer. The patient's daughter, Anderson Malta lives in [place] and is a Hotel manager. The patient's daughter, Doroteo Bradford lives in [place] and works as a Engineer, mining. The patient has 2 grandchildren and 2 great grandchildren.   ADVANCED DIRECTIVES: In the  absence of any documentation to the contrary, the patient's spouse is their HCPOA.    HEALTH MAINTENANCE: Social History   Tobacco Use   Smoking status: Never Smoker   Smokeless tobacco: Never Used  Substance Use Topics   Alcohol use: No    Alcohol/week: 0.0 standard drinks   Drug use: No     Colonoscopy: 11/18/2015/ Dr. Fuller Plan  PAP: 03/2017  Bone density: 2018   Allergies  Allergen Reactions   Pravastatin Other (See Comments)    Myalgia, fatigue, memory loss   Amoxicillin Itching    REACTION: rash   Atorvastatin Other (See Comments)    Myalgia, fatigue, memory loss   Codeine Nausea And Vomiting    Current Outpatient Medications  Medication Sig Dispense Refill   albuterol (VENTOLIN HFA) 108 (90 Base) MCG/ACT inhaler Inhale 2 puffs into the lungs every 6 (six) hours as needed for wheezing or shortness of breath. 1 Inhaler 0   ASPIR-LOW 81 MG EC tablet TAKE 1 TABLET BY MOUTH DAILY 30 tablet 3   bisoprolol-hydrochlorothiazide (ZIAC) 5-6.25 MG tablet TAKE 1  TABLET BY MOUTH ONCE DAILY 90 tablet 1   Blood Glucose Monitoring Suppl (FREESTYLE LITE) DEVI Please dispense Freestyle Lite Glucometer.  DX  E11.9 1 each 0   diclofenac Sodium (VOLTAREN) 1 % GEL Apply 2 g topically 4 (four) times daily as needed. 50 g 0   fluticasone (FLONASE) 50 MCG/ACT nasal spray Place 2 sprays into both nostrils daily. 16 g 6   glipiZIDE (GLUCOTROL) 5 MG tablet Take 1.5 tablets (7.5 mg total) by mouth daily before breakfast AND 2 tablets (10 mg total) daily before supper. 315 tablet 3   glucose blood (FREESTYLE LITE) test strip Check blood sugar twice daily as needed.  DX E11.9 100 each 1   metFORMIN (GLUCOPHAGE) 1000 MG tablet Take 1 tablet (1,000 mg total) by mouth 2 (two) times daily with a meal. 180 tablet 3   montelukast (SINGULAIR) 10 MG tablet Take 1 tablet (10 mg total) by mouth at bedtime as needed. 30 tablet 3   omeprazole (PRILOSEC) 40 MG capsule TAKE 1 CAPSULE BY MOUTH TWICE DAILY 180 capsule 1   pantoprazole (PROTONIX) 40 MG tablet Take 1 tablet (40 mg total) by mouth daily. (Patient not taking: Reported on 01/09/2020) 30 tablet 0   rosuvastatin (CRESTOR) 5 MG tablet Take 1 tablet (5 mg total) by mouth daily. (Patient taking differently: Take 5 mg by mouth daily. 1/2 tab daily) 90 tablet 1   No current facility-administered medications for this visit.   Facility-Administered Medications Ordered in Other Visits  Medication Dose Route Frequency Provider Last Rate Last Admin   Chlorhexidine Gluconate Cloth 2 % PADS 6 each  6 each Topical Once [name], MD       And   Chlorhexidine Gluconate Cloth 2 % PADS 6 each  6 each Topical Once [name], MD        OBJECTIVE: African-American woman who appears well  Vitals:   04/16/20 1317  BP: 127/72  Pulse: 77  Resp: 18  Temp: (!) 97.4 F (36.3 C)  SpO2: 98%     Body mass index is 33.5 kg/m.   Wt Readings from Last 3 Encounters:  04/16/20 189 lb 1.6 oz (85.8 kg)  04/08/20 190 lb 2 oz (86.2  kg)  03/05/20 190 lb (86.2 kg)      ECOG FS:1 - Symptomatic but completely ambulatory  Sclerae unicteric, EOMs intact Wearing a mask No cervical or supraclavicular adenopathy  Lungs no rales or rhonchi Heart regular rate and rhythm Abd soft, nontender, positive bowel sounds MSK no focal spinal tenderness, no upper extremity lymphedema Neuro: nonfocal, well oriented, appropriate affect Breasts: The right breast is status post lumpectomy and radiation.  There continues to be some scar tissue above the incision.  This is unchanged.  The left breast is also status post lumpectomy.  There is no evidence of local recurrence..  Both axillae are benign.   LAB RESULTS:  CMP     Component Value Date/Time   NA 140 03/06/2020 0801   K 4.2 03/06/2020 0801   CL 104 03/06/2020 0801   CO2 26 03/06/2020 0801   GLUCOSE 178 (H) 03/06/2020 0801   GLUCOSE 141 (H) 03/28/2006 0843   BUN 19 03/06/2020 0801   CREATININE 1.06 (H) 03/06/2020 0801   CALCIUM 9.4 03/06/2020 0801   PROT 6.8 03/06/2020 0801   ALBUMIN 3.9 01/02/2020 1458   AST 15 03/06/2020 0801   AST 44 (H) 03/15/2018 1206   ALT 20 03/06/2020 0801   ALT 70 (H) 03/15/2018 1206   ALKPHOS 68 01/02/2020 1458   BILITOT 0.3 03/06/2020 0801   BILITOT 0.5 03/15/2018 1206   GFRNONAA 47 (L) 01/02/2020 1458   GFRNONAA 50 (L) 03/15/2018 1206   GFRAA 54 (L) 01/02/2020 1458   GFRAA 58 (L) 03/15/2018 1206    Lab Results  Component Value Date   WBC 4.7 03/06/2020   NEUTROABS 2,566 03/06/2020   HGB 12.4 03/06/2020   HCT 38.0 03/06/2020   MCV 79.5 (L) 03/06/2020   PLT 215 03/06/2020   No results found for: LABCA2  No components found for: [id number]  No results for input(s): INR in the last 168 hours.  No results found for: LABCA2  No results found for: NWG956  No results found for: OZH086  No results found for: VHQ469  No results found for: CA2729  No components found for: HGQUANT  No results found for: CEA1 / No results found  for: CEA1   No results found for: AFPTUMOR  No results found for: CHROMOGRNA  No results found for: TOTALPROTELP, ALBUMINELP, A1GS, A2GS, BETS, BETA2SER, GAMS, MSPIKE, SPEI (this displays SPEP labs)  No results found for: KPAFRELGTCHN, LAMBDASER, KAPLAMBRATIO (kappa/lambda light chains)  No results found for: HGBA, HGBA2QUANT, HGBFQUANT, HGBSQUAN (Hemoglobinopathy evaluation)   No results found for: LDH  No results found for: IRON, TIBC, IRONPCTSAT (Iron and TIBC)  No results found for: FERRITIN  Urinalysis    Component Value Date/Time   COLORURINE YELLOW 01/02/2020 1439   APPEARANCEUR CLEAR 01/02/2020 1439   LABSPEC >1.030 (H) 01/02/2020 1439   PHURINE 5.0 01/02/2020 1439   GLUCOSEU NEGATIVE 01/02/2020 1439   GLUCOSEU NEGATIVE 08/08/2019 1011   HGBUR NEGATIVE 01/02/2020 1439   HGBUR negative 08/22/2009 0814   BILIRUBINUR NEGATIVE 01/02/2020 1439   BILIRUBINUR negative 07/27/2016 1535   KETONESUR NEGATIVE 01/02/2020 1439   PROTEINUR NEGATIVE 01/02/2020 1439   UROBILINOGEN 0.2 08/08/2019 1011   NITRITE NEGATIVE 01/02/2020 1439   LEUKOCYTESUR NEGATIVE 01/02/2020 1439    STUDIES: No results found.  ELIGIBLE FOR AVAILABLE RESEARCH PROTOCOL: no  ASSESSMENT: 69 y.o. [place], [place] woman status post right breast biopsy 03/07/2018 for ductal carcinoma in situ, grade 3, estrogen and progesterone receptor negative  (b) breast biopsy 03/23/2018 showed ductal carcinoma in situ, grade 1, estrogen and progesterone receptor positive  (1) status post right lumpectomy 05/02/2018 for a 0.9 cm ductal carcinoma in situ, grade 2, with focally positive margins  (a)  left lumpectomy 05/02/2018 showed no residual disease  (b) additional surgery for margin clearance 05/25/2018 was successful  (2) adjuvant radiation completed 07/31/2018  (3) the patient has had genetics testing through her gynecologist.  Per patient no deleterious mutations found  (4) DEXA scan 02/28/2018 showed a T  score of -1.7   PLAN: Martika is feeling much more confident about her cancer.  She understands it was not invasive and that she has an excellent chance of being cured.  At this point I feel comfortable releasing her to her primary care physician.  All she will need in terms of breast cancer follow-up is a yearly physician breast exam and yearly mammography  I will be glad to see Richa again any point in the future if and when the need arises but as of now are making no further routine appointments for her ear  Total encounter time 20 minutes.Sarajane Jews C. Yann Biehn, MD 04/16/20 1:31 PM Medical Oncology and Hematology [facility] [place], [place] [address] Tel. [phone]    Fax. [phone]   I, Wilburn Mylar, am acting as scribe for Dr. Virgie Dad. Teosha Casso.  I, Lurline Del MD, have reviewed the above documentation for accuracy and completeness, and I agree with the above.    *Total Encounter Time as defined by the Centers for Medicare and Medicaid Services includes, in addition to the face-to-face time of a patient visit (documented in the note above) non-face-to-face time: obtaining and reviewing outside history, ordering and reviewing medications, tests or procedures, care coordination (communications with other health care professionals or caregivers) and documentation in the medical record.

## 2020-04-18 ENCOUNTER — Other Ambulatory Visit: Payer: Self-pay | Admitting: Oncology

## 2020-04-28 ENCOUNTER — Other Ambulatory Visit: Payer: Self-pay | Admitting: Family Medicine

## 2020-04-28 MED FILL — ROSUVASTATIN CALCIUM 5 MG T: 5 | 90 days supply | Qty: 90 | Fill #0

## 2020-05-13 ENCOUNTER — Other Ambulatory Visit: Payer: Self-pay

## 2020-05-13 NOTE — Patient Outreach (Signed)
  [facility]) Care Management Chronic Special Needs Program    05/13/2020  Name: [name], DOB: [birth year]  MRN: [id number]   [name] is enrolled in a chronic special needs plan for Diabetes. [facility] will continue to provide services for this member through 06/13/20.  The [facility] care management team will assume care 06/14/2020.   Quinn Plowman RN,BSN,CCM [facility] [phone]

## 2020-05-15 ENCOUNTER — Other Ambulatory Visit: Payer: Self-pay

## 2020-05-15 NOTE — Patient Outreach (Signed)
  [facility]) Care Management Chronic Special Needs Program    05/15/2020  Name: Valyncia Wiens, DOB: Nov 25, 1950  MRN: [id number]  [facility] care management team has assumed care and services for this member. Case Closed by [facility] care management.   Quinn Plowman RN,BSN,CCM [facility] Management [phone]

## 2020-05-26 MED FILL — glipiZIDE 5 MG TABS: 5 | 90 days supply | Qty: 225 | Fill #2

## 2020-06-10 ENCOUNTER — Other Ambulatory Visit: Payer: Self-pay | Admitting: Family Medicine

## 2020-06-30 ENCOUNTER — Ambulatory Visit: Payer: HMO

## 2020-07-04 ENCOUNTER — Other Ambulatory Visit: Payer: Self-pay | Admitting: Family Medicine

## 2020-07-04 MED FILL — BISOPROLOL-HCTZ 5-6.25 MG T: 5-6.25 | 90 days supply | Qty: 90 | Fill #0

## 2020-07-04 MED FILL — OMEPRAZOLE 40 MG CPDR: 40 | 90 days supply | Qty: 180 | Fill #1

## 2020-07-15 ENCOUNTER — Ambulatory Visit: Payer: HMO | Admitting: Internal Medicine

## 2020-07-21 ENCOUNTER — Telehealth: Payer: Self-pay

## 2020-07-21 NOTE — Telephone Encounter (Signed)
[facility] sent in a letter stating that patient has not scheduled a follow visit. I called patient and she stated that she does not use them anymore. She goes to the placed that she used for when she had cancer.

## 2020-07-29 MED FILL — METFORMIN HCL 1000 MG TABS: 1000 | 90 days supply | Qty: 180 | Fill #2

## 2020-07-29 MED FILL — ROSUVASTATIN CALCIUM 5 MG T: 5 | 90 days supply | Qty: 90 | Fill #1

## 2020-07-29 MED FILL — glipiZIDE 5 MG TABS: 5 | 90 days supply | Qty: 315 | Fill #0

## 2020-08-02 ENCOUNTER — Other Ambulatory Visit: Payer: Self-pay | Admitting: Internal Medicine

## 2020-08-05 ENCOUNTER — Encounter: Payer: HMO | Admitting: Family Medicine

## 2020-08-18 ENCOUNTER — Encounter: Payer: Self-pay | Admitting: Family Medicine

## 2020-08-18 ENCOUNTER — Telehealth (INDEPENDENT_AMBULATORY_CARE_PROVIDER_SITE_OTHER): Payer: HMO | Admitting: Family Medicine

## 2020-08-18 ENCOUNTER — Other Ambulatory Visit: Payer: Self-pay

## 2020-08-18 VITALS — BP 137/77 | HR 84 | Temp 97.0°F

## 2020-08-18 DIAGNOSIS — I10 Essential (primary) hypertension: Secondary | ICD-10-CM

## 2020-08-18 DIAGNOSIS — E1165 Type 2 diabetes mellitus with hyperglycemia: Secondary | ICD-10-CM | POA: Diagnosis not present

## 2020-08-18 DIAGNOSIS — E782 Mixed hyperlipidemia: Secondary | ICD-10-CM | POA: Diagnosis not present

## 2020-08-18 DIAGNOSIS — F418 Other specified anxiety disorders: Secondary | ICD-10-CM | POA: Diagnosis not present

## 2020-08-18 DIAGNOSIS — R109 Unspecified abdominal pain: Secondary | ICD-10-CM

## 2020-08-18 DIAGNOSIS — D0512 Intraductal carcinoma in situ of left breast: Secondary | ICD-10-CM | POA: Diagnosis not present

## 2020-08-18 NOTE — Assessment & Plan Note (Signed)
Encouraged heart healthy diet, increase exercise, avoid trans fats, consider a krill oil cap daily. Tolerating Rosuvastatin 

## 2020-08-18 NOTE — Assessment & Plan Note (Signed)
Her mother in law passed away after a long battle with dementia on 07/20/20 and that has been stressful but she feels she is managing well.

## 2020-08-18 NOTE — Assessment & Plan Note (Signed)
She sees endocrinology tomorrow.

## 2020-08-18 NOTE — Assessment & Plan Note (Signed)
She continues to have several episodes a week of abdominal pain always right sided, it starts in the RUQ and radiates to the RLQ. It happens at night when she is sleeping and typically wakes her around 2 am when it happens and keeps her up the rest of the night. She moves her bowels daily although she does notes that the bowel movements have been slightly smaller recently. She has noted some blood vaginally vs rectally off and on for about 12 months but over the last month it has largely resolved. She has been evaluated by gynecology and no cause was found. Will proceed with iFOB and lab work. She is due for a colonoscopy this screen and she understands she will need to proceed. Will proceed with repeat AXR but she did have a CT abdomen last summer when she had similar symptoms and it was unremarkable

## 2020-08-18 NOTE — Assessment & Plan Note (Signed)
Monitor and report any concerns, no changes to meds. Encouraged heart healthy diet such as the DASH diet and exercise as tolerated.  ?

## 2020-08-18 NOTE — Progress Notes (Signed)
Virtual Visit via Video Note  I connected with [name] on 08/18/20 at  3:00 PM EST by a video enabled telemedicine application and verified that I am speaking with the correct person using two identifiers.  Location: Patient: home, patient and provider in visit Provider: office   I discussed the limitations of evaluation and management by telemedicine and the availability of in person appointments. The patient expressed understanding and agreed to proceed. S Chism, CMA was able to get the patient set up on a video visit    Subjective:    Patient ID: [name], female    DOB: 19-Sep-1950, 70 y.o.   MRN: [id number]  Chief Complaint  Patient presents with  . Follow-up  . Dizziness         HPI Patient is in today for follow up on chronic medical concerns.no recent febrile illness or hospitalizations. She has had stress secondary to the loss of her mother in law last month but she feels she is handling it fairly well. She continues to have several episodes a week of abdominal pain always right sided, it starts in the RUQ and radiates to the RLQ. It happens at night when she is sleeping and typically wakes her around 2 am when it happens and keeps her up the rest of the night. She moves her bowels daily although she does notes that the bowel movements have been slightly smaller recently. She has noted some blood vaginally vs rectally off and on for about 12 months but over the last month it has largely resolved. She has been evaluated by gynecology and no cause was found. No fevers or chills. Denies CP/palp/SOB/HA/congestion/fevers/GI or GU c/o. Taking meds as prescribed.   Past Medical History:  Diagnosis Date  . Acute bronchitis 10/31/2015  . Allergy    seasonal  . Arthritis    knee-left  . Cancer ([place]) 04/2018   right breast ca  . Cataract    right eye  . Diabetes mellitus 52   type 2  . Diaphragmatic hernia without mention of obstruction or gangrene   . Endometriosis    . Esophageal reflux   . Esophageal stricture   . GERD (gastroesophageal reflux disease)   . Hyperlipidemia   . Hyperplastic colon polyp   . Hypertension 52  . Nonalcoholic fatty liver disease 04/28/2010   Qualifier: Diagnosis of  By: Arnoldo Morale MD, Balinda Quails   . Nonspecific abnormal results of liver function study   . Personal history of radiation therapy 08/2018   bilateral breasts  . Pharyngitis 02/18/2012  . Plantar fascial fibromatosis   . Preventative health care 01/22/2013  . Seasonal allergies 10/31/2015  . Sleep apnea    has oral appliance but does not fit well due to missing teeth  . Sore throat 04/03/2017  . Tubular adenoma of colon   . Unspecified sleep apnea    no c-pap    Past Surgical History:  Procedure Laterality Date  . ABDOMINAL HYSTERECTOMY  1982   partial  . APPENDECTOMY    . 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 BREAST BIOPSY Bilateral 04/03/2018  . BREAST BIOPSY Bilateral 03/23/2018  . BREAST CYST INCISION AND DRAINAGE     right breast  . BREAST EXCISIONAL BIOPSY Right 2019  . BREAST LUMPECTOMY Bilateral 04/2018  . BREAST LUMPECTOMY WITH RADIOACTIVE SEED LOCALIZATION Bilateral 05/02/2018   Procedure: RIGHT BREAST SEED LUMPECTOMY X2 AND LEFT BREAST SEED GUIDED LUMPECTOMY;  Surgeon: [name], MD;  Location:  [facility];  Service: General;  Laterality: Bilateral;  . CATARACT EXTRACTION     right eye  . HERNIA REPAIR     per pt, she is not aware of this surgery  . RE-EXCISION OF BREAST LUMPECTOMY Right 05/25/2018   Procedure: RE-EXCISION OF RIGHT BREAST LUMPECTOMY;  Surgeon: [name], MD;  Location: [facility];  Service: General;  Laterality: Right;  . seba    . TUBAL LIGATION      Family History  Problem Relation Age of Onset  . Breast cancer Mother   . Hypertension Mother   . Diabetes Mother        type 2  . Other Mother        esophagus surgery  . Heart disease Father   . Hyperlipidemia Father   .  Hypertension Father   . Heart attack Father        MI at age 66  . Diabetes Brother        type 2  . Heart disease Maternal Grandmother   . Heart attack Maternal Grandfather   . Hypertension Maternal Grandfather   . Stroke Paternal Grandmother   . Hypertension Sister   . Diabetes Sister   . Hypertension Brother   . Alcohol abuse Maternal Aunt   . Cancer Maternal Aunt   . Colon cancer Neg Hx   . Stomach cancer Neg Hx   . Esophageal cancer Neg Hx     Social History   Socioeconomic History  . Marital status: Married    Spouse name: [name]  . Number of children: 2  . Years of education: Not on file  . Highest education level: Not on file  Occupational History  . Occupation: Building surveyor: [employer]  Tobacco Use  . Smoking status: Never Smoker  . Smokeless tobacco: Never Used  Substance and Sexual Activity  . Alcohol use: No    Alcohol/week: 0.0 standard drinks  . Drug use: No  . Sexual activity: Yes    Birth control/protection: Post-menopausal    Comment: lives with husband, no dietary restrictions just watching carbs, wears seat belt  Other Topics Concern  . Not on file  Social History Narrative  . Not on file   Social Determinants of Health   Financial Resource Strain: Not on file  Food Insecurity: No Food Insecurity  . Worried About Charity fundraiser in the Last Year: Never true  . Ran Out of Food in the Last Year: Never true  Transportation Needs: No Transportation Needs  . Lack of Transportation (Medical): No  . Lack of Transportation (Non-Medical): No  Physical Activity: Not on file  Stress: Not on file  Social Connections: Not on file  Intimate Partner Violence: Not on file    Outpatient Medications Prior to Visit  Medication Sig Dispense Refill  . albuterol (VENTOLIN HFA) 108 (90 Base) MCG/ACT inhaler Inhale 2 puffs into the lungs every 6 (six) hours as needed for wheezing or shortness of breath. 1 Inhaler 0  . ASPIR-LOW 81 MG EC  tablet TAKE 1 TABLET BY MOUTH DAILY 30 tablet 3  . bisoprolol-hydrochlorothiazide (ZIAC) 5-6.25 MG tablet TAKE 1 TABLET BY MOUTH ONCE DAILY 90 tablet 0  . Blood Glucose Monitoring Suppl (FREESTYLE LITE) DEVI Please dispense Freestyle Lite Glucometer.  DX  E11.9 1 each 0  . fluticasone (FLONASE) 50 MCG/ACT nasal spray Place 2 sprays into both nostrils daily. 16 g 6  . FREESTYLE LITE test  strip CHECK BLOOD SUGAR TWICE DAILY AS NEEDED. DX E11.9 100 strip 3  . glipiZIDE (GLUCOTROL) 5 MG tablet Take 1.5 tablets (7.5 mg total) by mouth daily before breakfast AND 2 tablets (10 mg total) daily before supper. 315 tablet 3  . metFORMIN (GLUCOPHAGE) 1000 MG tablet Take 1 tablet (1,000 mg total) by mouth 2 (two) times daily with a meal. 180 tablet 3  . montelukast (SINGULAIR) 10 MG tablet Take 1 tablet (10 mg total) by mouth at bedtime as needed. 30 tablet 3  . omeprazole (PRILOSEC) 40 MG capsule TAKE 1 CAPSULE BY MOUTH TWICE DAILY 180 capsule 1  . rosuvastatin (CRESTOR) 5 MG tablet Take 1 tablet (5 mg total) by mouth daily. 90 tablet 1  . diclofenac Sodium (VOLTAREN) 1 % GEL Apply 2 g topically 4 (four) times daily as needed. 50 g 0  . pantoprazole (PROTONIX) 40 MG tablet Take 1 tablet (40 mg total) by mouth daily. (Patient not taking: Reported on 01/09/2020) 30 tablet 0   Facility-Administered Medications Prior to Visit  Medication Dose Route Frequency Provider Last Rate Last Admin  . Chlorhexidine Gluconate Cloth 2 % PADS 6 each  6 each Topical Once [name], MD       And  . Chlorhexidine Gluconate Cloth 2 % PADS 6 each  6 each Topical Once [name], MD        Allergies  Allergen Reactions  . Pravastatin Other (See Comments)    Myalgia, fatigue, memory loss  . Amoxicillin Itching    REACTION: rash  . Atorvastatin Other (See Comments)    Myalgia, fatigue, memory loss  . Codeine Nausea And Vomiting    Review of Systems  Constitutional: Negative for fever and malaise/fatigue.  HENT:  Negative for congestion.   Eyes: Negative for blurred vision.  Respiratory: Negative for shortness of breath.   Cardiovascular: Negative for chest pain, palpitations and leg swelling.  Gastrointestinal: Positive for abdominal pain. Negative for blood in stool and nausea.  Genitourinary: Negative for dysuria and frequency.  Musculoskeletal: Negative for falls.  Skin: Negative for rash.  Neurological: Negative for dizziness, loss of consciousness and headaches.  Endo/Heme/Allergies: Negative for environmental allergies.  Psychiatric/Behavioral: Negative for depression. The patient is nervous/anxious.        Objective:    Physical Exam Constitutional:      Appearance: Normal appearance. She is not ill-appearing.  HENT:     Head: Normocephalic and atraumatic.     Right Ear: External ear normal.     Left Ear: External ear normal.     Nose: Nose normal.  Eyes:     General:        Right eye: No discharge.        Left eye: No discharge.  Pulmonary:     Effort: Pulmonary effort is normal.  Neurological:     Mental Status: She is alert and oriented to person, place, and time.  Psychiatric:        Behavior: Behavior normal.     BP 137/77   Pulse 84   Temp (!) 97 F (36.1 C)  Wt Readings from Last 3 Encounters:  04/16/20 189 lb 1.6 oz (85.8 kg)  04/08/20 190 lb 2 oz (86.2 kg)  03/05/20 190 lb (86.2 kg)    Diabetic Foot Exam - Simple   No data filed    Lab Results  Component Value Date   WBC 4.7 03/06/2020   HGB 12.4 03/06/2020   HCT 38.0 03/06/2020  PLT 215 03/06/2020   GLUCOSE 178 (H) 03/06/2020   CHOL 148 03/06/2020   TRIG 133 03/06/2020   HDL 40 (L) 03/06/2020   LDLDIRECT 82.0 03/05/2019   LDLCALC 85 03/06/2020   ALT 20 03/06/2020   AST 15 03/06/2020   NA 140 03/06/2020   K 4.2 03/06/2020   CL 104 03/06/2020   CREATININE 1.06 (H) 03/06/2020   BUN 19 03/06/2020   CO2 26 03/06/2020   TSH 1.58 03/06/2020   HGBA1C 8.3 (A) 04/08/2020   MICROALBUR <0.7  12/04/2015    Lab Results  Component Value Date   TSH 1.58 03/06/2020   Lab Results  Component Value Date   WBC 4.7 03/06/2020   HGB 12.4 03/06/2020   HCT 38.0 03/06/2020   MCV 79.5 (L) 03/06/2020   PLT 215 03/06/2020   Lab Results  Component Value Date   NA 140 03/06/2020   K 4.2 03/06/2020   CO2 26 03/06/2020   GLUCOSE 178 (H) 03/06/2020   BUN 19 03/06/2020   CREATININE 1.06 (H) 03/06/2020   BILITOT 0.3 03/06/2020   ALKPHOS 68 01/02/2020   AST 15 03/06/2020   ALT 20 03/06/2020   PROT 6.8 03/06/2020   ALBUMIN 3.9 01/02/2020   CALCIUM 9.4 03/06/2020   ANIONGAP 13 01/02/2020   GFR 69.33 08/08/2019   Lab Results  Component Value Date   CHOL 148 03/06/2020   Lab Results  Component Value Date   HDL 40 (L) 03/06/2020   Lab Results  Component Value Date   LDLCALC 85 03/06/2020   Lab Results  Component Value Date   TRIG 133 03/06/2020   Lab Results  Component Value Date   CHOLHDL 3.7 03/06/2020   Lab Results  Component Value Date   HGBA1C 8.3 (A) 04/08/2020       Assessment & Plan:   Problem List Items Addressed This Visit    Type 2 diabetes mellitus with hyperglycemia, without long-term current use of insulin ([place]) - Primary    She sees endocrinology tomorrow.       Hyperlipidemia, mixed    Encouraged heart healthy diet, increase exercise, avoid trans fats, consider a krill oil cap daily. Tolerating Rosuvastatin.       Relevant Orders   Lipid panel   Depression with anxiety    Her mother in law passed away after a long battle with dementia on 07/20/20 and that has been stressful but she feels she is managing well.       Essential hypertension    Monitor and report any concerns, no changes to meds. Encouraged heart healthy diet such as the DASH diet and exercise as tolerated.       Relevant Orders   TSH   Abdominal pain    She continues to have several episodes a week of abdominal pain always right sided, it starts in the RUQ and radiates to  the RLQ. It happens at night when she is sleeping and typically wakes her around 2 am when it happens and keeps her up the rest of the night. She moves her bowels daily although she does notes that the bowel movements have been slightly smaller recently. She has noted some blood vaginally vs rectally off and on for about 12 months but over the last month it has largely resolved. She has been evaluated by gynecology and no cause was found. Will proceed with iFOB and lab work. She is due for a colonoscopy this screen and she understands she will need to  proceed. Will proceed with repeat AXR but she did have a CT abdomen last summer when she had similar symptoms and it was unremarkable       Relevant Orders   CBC with Differential/Platelet   Comprehensive metabolic panel   Urinalysis   Urine Culture   DG Abd 2 Views   Ductal carcinoma in situ (DCIS) of left breast    She has been released by oncology and will need annual mammograms ordered. Last one was 03/09/20         I have discontinued Payson Woon's diclofenac Sodium and pantoprazole. I am also having her maintain her Aspir-Low, albuterol, fluticasone, montelukast, metFORMIN, FreeStyle Lite, omeprazole, glipiZIDE, rosuvastatin, bisoprolol-hydrochlorothiazide, and FREESTYLE LITE.  No orders of the defined types were placed in this encounter.   I discussed the assessment and treatment plan with the patient. The patient was provided an opportunity to ask questions and all were answered. The patient agreed with the plan and demonstrated an understanding of the instructions.   The patient was advised to call back or seek an in-person evaluation if the symptoms worsen or if the condition fails to improve as anticipated.  I provided 20 minutes of non-face-to-face time during this encounter.   [name], MD

## 2020-08-18 NOTE — Assessment & Plan Note (Signed)
She has been released by oncology and will need annual mammograms ordered. Last one was 03/09/20

## 2020-08-19 ENCOUNTER — Other Ambulatory Visit: Payer: Self-pay

## 2020-08-19 ENCOUNTER — Ambulatory Visit (INDEPENDENT_AMBULATORY_CARE_PROVIDER_SITE_OTHER): Payer: HMO | Admitting: Internal Medicine

## 2020-08-19 ENCOUNTER — Encounter: Payer: Self-pay | Admitting: Internal Medicine

## 2020-08-19 ENCOUNTER — Other Ambulatory Visit: Payer: Self-pay | Admitting: Internal Medicine

## 2020-08-19 ENCOUNTER — Other Ambulatory Visit (INDEPENDENT_AMBULATORY_CARE_PROVIDER_SITE_OTHER): Payer: HMO

## 2020-08-19 VITALS — BP 130/76 | HR 90 | Ht 63.0 in | Wt 191.2 lb

## 2020-08-19 DIAGNOSIS — I10 Essential (primary) hypertension: Secondary | ICD-10-CM | POA: Diagnosis not present

## 2020-08-19 DIAGNOSIS — E782 Mixed hyperlipidemia: Secondary | ICD-10-CM

## 2020-08-19 DIAGNOSIS — E1165 Type 2 diabetes mellitus with hyperglycemia: Secondary | ICD-10-CM | POA: Diagnosis not present

## 2020-08-19 DIAGNOSIS — R109 Unspecified abdominal pain: Secondary | ICD-10-CM

## 2020-08-19 LAB — POCT GLUCOSE (DEVICE FOR HOME USE): POC Glucose: 196 mg/dl — AB (ref 70–99)

## 2020-08-19 LAB — POCT GLYCOSYLATED HEMOGLOBIN (HGB A1C): Hemoglobin A1C: 8.3 % — AB (ref 4.0–5.6)

## 2020-08-19 LAB — HM DIABETES FOOT EXAM

## 2020-08-19 MED ORDER — ONETOUCH VERIO VI STRP: ORAL_STRIP | 12 refills | Status: DC

## 2020-08-19 MED ORDER — ONETOUCH VERIO VI STRP: 1.0000 | ORAL_STRIP | Freq: Two times a day (BID) | 12 refills | Status: DC

## 2020-08-19 MED ORDER — RYBELSUS 3 MG PO TABS: 3.0000 mg | ORAL_TABLET | Freq: Every day | ORAL | 0 refills | Status: DC

## 2020-08-19 MED FILL — ONE TOUCH VERIO TEST STRIP: 50 days supply | Qty: 100 | Fill #0

## 2020-08-19 MED FILL — RYBELSUS 3 MG TABS: 3 | 90 days supply | Qty: 90 | Fill #0

## 2020-08-19 NOTE — Progress Notes (Signed)
Name: [name]  Age/ Sex: 70 y.o., female   MRN/ DOB: [id number], 09-Dec-1950     PCP: Christine Lukes, MD   Reason for Endocrinology Evaluation: Type 2 Diabetes Mellitus  Initial Endocrine Consultative Visit: 02/22/2017    PATIENT IDENTIFIER: [name] is a 70 y.o. female with a past medical history of T2DM, HTN , Dyslipidemia, Hx of breast Ca ( S/P right lumpectomy) . The patient has followed with Endocrinology clinic since 02/16/2019 for consultative assistance with management of her diabetes.  DIABETIC HISTORY:  [name] was diagnosed with T2DM in 2001. She has been on metformin, Glimepiride and Januvia. Invokana added in 2018 but was cost prohibitive. Her hemoglobin A1c has ranged from 7.0% in 2016, peaking at 8.8% in  2021.    She used to see Dr.Gherghe but was lost to follow up by 2019 , until her return to our clinic 08/2019.   SUBJECTIVE:   During the last visit (04/08/2020):  A1c 8.3 %. Increased Glipizide and continued Metformin   Today (08/19/2020): [name] is here for a follow up on diabetes management.  She checks her blood sugars 2 times daily The patient has not had hypoglycemic episodes since the last clinic visit.    She has side pain and does not wait 30 minutes before eating  Denies diarrhea    HOME DIABETES REGIMEN:  Metformin 1000 mg, BID Glipizide 5 mg , 1.5 tablet before breakfast and 2 tabs before dinner     Statin:  Crestor ACE-I/ARB: No    METER DOWNLOAD SUMMARY: unable to download BG 84-296  mg/dL    DIABETIC COMPLICATIONS: Microvascular complications:    Denies: CKD, retinopathy , neuropathy  Last Eye Exam: Completed 12/2019  Macrovascular complications:    Denies: CAD, CVA, PVD   HISTORY:  Past Medical History:  Past Medical History:  Diagnosis Date  . Acute bronchitis 10/31/2015  . Allergy    seasonal  . Arthritis    knee-left  . Cancer ([place]) 04/2018   right breast ca  . Cataract    right eye   . Diabetes mellitus 52   type 2  . Diaphragmatic hernia without mention of obstruction or gangrene   . Endometriosis   . Esophageal reflux   . Esophageal stricture   . GERD (gastroesophageal reflux disease)   . Hyperlipidemia   . Hyperplastic colon polyp   . Hypertension 52  . Nonalcoholic fatty liver disease 04/28/2010   Qualifier: Diagnosis of  By: Arnoldo Morale MD, Balinda Quails   . Nonspecific abnormal results of liver function study   . Personal history of radiation therapy 08/2018   bilateral breasts  . Pharyngitis 02/18/2012  . Plantar fascial fibromatosis   . Preventative health care 01/22/2013  . Seasonal allergies 10/31/2015  . Sleep apnea    has oral appliance but does not fit well due to missing teeth  . Sore throat 04/03/2017  . Tubular adenoma of colon   . Unspecified sleep apnea    no c-pap   Past Surgical History:  Past Surgical History:  Procedure Laterality Date  . ABDOMINAL HYSTERECTOMY  1982   partial  . APPENDECTOMY    . 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 BREAST BIOPSY Bilateral 04/03/2018  . BREAST BIOPSY Bilateral 03/23/2018  . BREAST CYST INCISION AND DRAINAGE     right breast  . BREAST EXCISIONAL BIOPSY Right 2019  . BREAST LUMPECTOMY Bilateral 04/2018  . BREAST LUMPECTOMY  WITH RADIOACTIVE SEED LOCALIZATION Bilateral 05/02/2018   Procedure: RIGHT BREAST SEED LUMPECTOMY X2 AND LEFT BREAST SEED GUIDED LUMPECTOMY;  Surgeon: [name], MD;  Location: [facility];  Service: General;  Laterality: Bilateral;  . CATARACT EXTRACTION     right eye  . HERNIA REPAIR     per pt, she is not aware of this surgery  . RE-EXCISION OF BREAST LUMPECTOMY Right 05/25/2018   Procedure: RE-EXCISION OF RIGHT BREAST LUMPECTOMY;  Surgeon: [name], MD;  Location: [facility];  Service: General;  Laterality: Right;  . seba    . TUBAL LIGATION      Social History:  reports that she has never smoked. She has never used smokeless tobacco. She  reports that she does not drink alcohol and does not use drugs. Family History:  Family History  Problem Relation Age of Onset  . Breast cancer Mother   . Hypertension Mother   . Diabetes Mother        type 2  . Other Mother        esophagus surgery  . Heart disease Father   . Hyperlipidemia Father   . Hypertension Father   . Heart attack Father        MI at age 72  . Diabetes Brother        type 2  . Heart disease Maternal Grandmother   . Heart attack Maternal Grandfather   . Hypertension Maternal Grandfather   . Stroke Paternal Grandmother   . Hypertension Sister   . Diabetes Sister   . Hypertension Brother   . Alcohol abuse Maternal Aunt   . Cancer Maternal Aunt   . Colon cancer Neg Hx   . Stomach cancer Neg Hx   . Esophageal cancer Neg Hx      HOME MEDICATIONS: Allergies as of 08/19/2020      Reactions   Pravastatin Other (See Comments)   Myalgia, fatigue, memory loss   Amoxicillin Itching   REACTION: rash   Atorvastatin Other (See Comments)   Myalgia, fatigue, memory loss   Codeine Nausea And Vomiting      Medication List       Accurate as of August 19, 2020  2:55 PM. If you have any questions, ask your nurse or doctor.        albuterol 108 (90 Base) MCG/ACT inhaler Commonly known as: VENTOLIN HFA Inhale 2 puffs into the lungs every 6 (six) hours as needed for wheezing or shortness of breath.   Aspir-Low 81 MG EC tablet Generic drug: aspirin TAKE 1 TABLET BY MOUTH DAILY   bisoprolol-hydrochlorothiazide 5-6.25 MG tablet Commonly known as: ZIAC TAKE 1 TABLET BY MOUTH ONCE DAILY   fluticasone 50 MCG/ACT nasal spray Commonly known as: FLONASE Place 2 sprays into both nostrils daily.   FreeStyle Lite Devi Please dispense Freestyle Lite Glucometer.  DX  E11.9   FREESTYLE LITE test strip Generic drug: glucose blood CHECK BLOOD SUGAR TWICE DAILY AS NEEDED. DX E11.9   glipiZIDE 5 MG tablet Commonly known as: GLUCOTROL Take 1.5 tablets (7.5 mg total)  by mouth daily before breakfast AND 2 tablets (10 mg total) daily before supper.   metFORMIN 1000 MG tablet Commonly known as: GLUCOPHAGE Take 1 tablet (1,000 mg total) by mouth 2 (two) times daily with a meal.   montelukast 10 MG tablet Commonly known as: SINGULAIR Take 1 tablet (10 mg total) by mouth at bedtime as needed.   omeprazole 40 MG capsule Commonly known  as: PRILOSEC TAKE 1 CAPSULE BY MOUTH TWICE DAILY   rosuvastatin 5 MG tablet Commonly known as: CRESTOR Take 1 tablet (5 mg total) by mouth daily.        OBJECTIVE:   Vital Signs: BP 130/76   Pulse 90   Ht 5\' 3"  (1.6 m)   Wt 191 lb 4 oz (86.8 kg)   SpO2 98%   BMI 33.88 kg/m   Wt Readings from Last 3 Encounters:  08/19/20 191 lb 4 oz (86.8 kg)  04/16/20 189 lb 1.6 oz (85.8 kg)  04/08/20 190 lb 2 oz (86.2 kg)     Exam: General: Pt appears well and is in NAD  Lungs: Clear with good BS bilat with no rales, rhonchi, or wheezes  Heart: RRR with normal S1 and S2 and no gallops; no murmurs; no rub  Skin: Cord-like thickening ~ 2.5 cm in length just above the right breast  Abdomen: Normoactive bowel sounds, soft, nontender, without masses or organomegaly palpable  Extremities: No pretibial edema.   Neuro: MS is good with appropriate affect, pt is alert and Ox3     DM foot exam 12/05/2019  The skin of the feet is intact without sores or ulcerations. The pedal pulses are 2+ on right and 2+ on left. The sensation is intact to a screening 5.07, 10 gram monofilament bilaterally  DATA REVIEWED:  Lab Results  Component Value Date   HGBA1C 8.3 (A) 08/19/2020   HGBA1C 8.3 (A) 04/08/2020   HGBA1C 8.0 (A) 12/05/2019   Lab Results  Component Value Date   MICROALBUR <0.7 12/04/2015   LDLCALC 85 03/06/2020   CREATININE 1.06 (H) 03/06/2020   Lab Results  Component Value Date   MICRALBCREAT 1.0 12/04/2015     Lab Results  Component Value Date   CHOL 148 03/06/2020   HDL 40 (L) 03/06/2020   LDLCALC 85  03/06/2020   LDLDIRECT 82.0 03/05/2019   TRIG 133 03/06/2020   CHOLHDL 3.7 03/06/2020         ASSESSMENT / PLAN / RECOMMENDATIONS:   1) Type 2 Diabetes Mellitus, Poorly  controlled, [place] complications - Most recent A1c of 8.3 %. Goal A1c < 7.0 %.      - A1c is stable  - Bg's have been fluctuating  - Will add GLP1 agonists , cautioned against GI side effects    MEDICATIONS:  - Continue Metformin 1000 mg , 1 tablet with breakfast and dinner - Continue Glipizide 5 mg, 1.5 tablet before breakfast and 2 tablets before dinner  - Start Rybelsus 3 mg , 1 tablet before Breakfast    EDUCATION / INSTRUCTIONS:  BG monitoring instructions: Patient is instructed to check her blood sugars 2 times a day, fasting and supper time.  Call [facility] if: BG persistently < 70  . I reviewed the Rule of 15 for the treatment of hypoglycemia in detail with the patient. Literature supplied.    2) Diabetic complications:   Eye: Does not have known diabetic retinopathy.   Neuro/ Feet: Does not have known diabetic peripheral neuropathy .   Renal: Patient does not have known baseline CKD. She   is not on an ACEI/ARB at present.    3) Dyslipidemia: Patient is on crestor 5 mg, taking half a tablet , this is all she could tolerate due to arthralgia. She is already on vitamin D. LDL at 85 mg/dL.     F/U in 3 months   Signed electronically by: Mack Guise, MD  [facility]  [facility] [address] [place], [place] [address] Phone: [phone] FAX: [phone]   CC: [name], [address] [place] [address] Phone: [phone]  Fax: [phone]  Return to Endocrinology clinic as below: No future appointments.

## 2020-08-19 NOTE — Patient Instructions (Signed)
-   Continue Metformin 1000 mg , 1 tablet with breakfast and 1 tablet with dinner - Continue Glipizide 5 mg, 1.5 tablet before breakfast and 2 tablets before  dinner  - Start Rybelsus 3 mg, 1 tablet before breakfast        HOW TO TREAT LOW BLOOD SUGARS (Blood sugar LESS THAN 70 MG/DL)  Please follow the RULE OF 15 for the treatment of hypoglycemia treatment (when your (blood sugars are less than 70 mg/dL)    STEP 1: Take 15 grams of carbohydrates when your blood sugar is low, which includes:   3-4 GLUCOSE TABS  OR  3-4 OZ OF JUICE OR REGULAR SODA OR  ONE TUBE OF GLUCOSE GEL     STEP 2: RECHECK blood sugar in 15 MINUTES STEP 3: If your blood sugar is still low at the 15 minute recheck --> then, go back to STEP 1 and treat AGAIN with another 15 grams of carbohydrates.

## 2020-08-20 LAB — URINE CULTURE
MICRO NUMBER:: 11621203
Result:: NO GROWTH
SPECIMEN QUALITY:: ADEQUATE

## 2020-08-20 LAB — CBC WITH DIFFERENTIAL/PLATELET
Basophils Absolute: 0.1 10*3/uL (ref 0.0–0.1)
Basophils Relative: 1 % (ref 0.0–3.0)
Eosinophils Absolute: 0.1 10*3/uL (ref 0.0–0.7)
Eosinophils Relative: 1.3 % (ref 0.0–5.0)
HCT: 36.6 % (ref 36.0–46.0)
Hemoglobin: 12.1 g/dL (ref 12.0–15.0)
Lymphocytes Relative: 32.1 % (ref 12.0–46.0)
Lymphs Abs: 1.7 10*3/uL (ref 0.7–4.0)
MCHC: 33.1 g/dL (ref 30.0–36.0)
MCV: 79.6 fl (ref 78.0–100.0)
Monocytes Absolute: 0.5 10*3/uL (ref 0.1–1.0)
Monocytes Relative: 9.9 % (ref 3.0–12.0)
Neutro Abs: 2.9 10*3/uL (ref 1.4–7.7)
Neutrophils Relative %: 55.7 % (ref 43.0–77.0)
Platelets: 219 10*3/uL (ref 150.0–400.0)
RBC: 4.59 Mil/uL (ref 3.87–5.11)
RDW: 14.4 % (ref 11.5–15.5)
WBC: 5.2 10*3/uL (ref 4.0–10.5)

## 2020-08-20 LAB — URINALYSIS
Bilirubin Urine: NEGATIVE
Hgb urine dipstick: NEGATIVE
Ketones, ur: NEGATIVE
Leukocytes,Ua: NEGATIVE
Nitrite: NEGATIVE
Specific Gravity, Urine: 1.02 (ref 1.000–1.030)
Total Protein, Urine: NEGATIVE
Urine Glucose: NEGATIVE
Urobilinogen, UA: 0.2 (ref 0.0–1.0)
pH: 5.5 (ref 5.0–8.0)

## 2020-08-20 LAB — COMPREHENSIVE METABOLIC PANEL
ALT: 20 U/L (ref 0–35)
AST: 15 U/L (ref 0–37)
Albumin: 4.1 g/dL (ref 3.5–5.2)
Alkaline Phosphatase: 68 U/L (ref 39–117)
BUN: 17 mg/dL (ref 6–23)
CO2: 24 mEq/L (ref 19–32)
Calcium: 9.8 mg/dL (ref 8.4–10.5)
Chloride: 101 mEq/L (ref 96–112)
Creatinine, Ser: 1.12 mg/dL (ref 0.40–1.20)
GFR: 50.73 mL/min — ABNORMAL LOW (ref >60.00)
Glucose, Bld: 72 mg/dL (ref 70–99)
Potassium: 4 mEq/L (ref 3.5–5.1)
Sodium: 138 mEq/L (ref 135–145)
Total Bilirubin: 0.3 mg/dL (ref 0.2–1.2)
Total Protein: 7 g/dL (ref 6.0–8.3)

## 2020-08-20 LAB — LIPID PANEL
Cholesterol: 160 mg/dL (ref 0–200)
HDL: 41.6 mg/dL (ref >39.00)
NonHDL: 118.01
Total CHOL/HDL Ratio: 4
Triglycerides: 210 mg/dL — ABNORMAL HIGH (ref 0.0–149.0)
VLDL: 42 mg/dL — ABNORMAL HIGH (ref 0.0–40.0)

## 2020-08-20 LAB — LDL CHOLESTEROL, DIRECT: Direct LDL: 96 mg/dL

## 2020-08-20 LAB — TSH: TSH: 1.26 u[IU]/mL (ref 0.35–4.50)

## 2020-08-21 ENCOUNTER — Encounter: Payer: Self-pay | Admitting: *Deleted

## 2020-08-21 ENCOUNTER — Other Ambulatory Visit: Payer: Self-pay

## 2020-08-21 DIAGNOSIS — E782 Mixed hyperlipidemia: Secondary | ICD-10-CM

## 2020-08-25 ENCOUNTER — Other Ambulatory Visit: Payer: Self-pay

## 2020-08-25 ENCOUNTER — Ambulatory Visit (HOSPITAL_BASED_OUTPATIENT_CLINIC_OR_DEPARTMENT_OTHER)
Admission: RE | Admit: 2020-08-25 | Discharge: 2020-08-25 | Disposition: A | Discharge status: Home / Self Care | Payer: HMO | Source: Ambulatory Visit | Attending: Family Medicine | Admitting: Family Medicine

## 2020-08-25 DIAGNOSIS — R109 Unspecified abdominal pain: Secondary | ICD-10-CM | POA: Diagnosis not present

## 2020-09-13 ENCOUNTER — Other Ambulatory Visit (HOSPITAL_BASED_OUTPATIENT_CLINIC_OR_DEPARTMENT_OTHER): Payer: Self-pay

## 2020-09-18 ENCOUNTER — Telehealth: Payer: Self-pay | Admitting: Internal Medicine

## 2020-09-18 NOTE — Telephone Encounter (Signed)
Spoken to patient and this is only time she had her blood sugar since she was seen.

## 2020-09-18 NOTE — Telephone Encounter (Signed)
PT Reporting Low BS - per patient was advised by Dr Kelton Pillar to report any blood sugars below 70.  Patient called to advise that 09/17/20 @ 730-800 PM her blood sugar was 39 - felt faint, anxious - drank a small regular soda and felt better.  Fasting lood sugar this am was 131.  Call back number [phone]

## 2020-09-19 NOTE — Telephone Encounter (Signed)
Spoken to patient and notified Dr Shamleffer's comments. Verbalized understanding.   

## 2020-09-19 NOTE — Telephone Encounter (Signed)
PLease ask the patient to reduce Glipizide from 2 tablets to one and a half before supper and continue one and a half before breakfast

## 2020-09-22 ENCOUNTER — Other Ambulatory Visit: Payer: Self-pay | Admitting: Internal Medicine

## 2020-09-22 ENCOUNTER — Other Ambulatory Visit (HOSPITAL_BASED_OUTPATIENT_CLINIC_OR_DEPARTMENT_OTHER): Payer: Self-pay

## 2020-09-22 MED ORDER — RYBELSUS 3 MG PO TABS
ORAL_TABLET | ORAL | 0 refills | Status: DC
  Filled 2020-09-22: qty 90, 90d supply, fill #0
  Filled 2020-11-24: qty 30, 30d supply, fill #0

## 2020-09-22 MED FILL — Glucose Blood Test Strip: 50 days supply | Qty: 100 | Fill #0 | Status: CN

## 2020-09-23 ENCOUNTER — Other Ambulatory Visit (HOSPITAL_BASED_OUTPATIENT_CLINIC_OR_DEPARTMENT_OTHER): Payer: Self-pay

## 2020-09-26 ENCOUNTER — Other Ambulatory Visit (HOSPITAL_BASED_OUTPATIENT_CLINIC_OR_DEPARTMENT_OTHER): Payer: Self-pay

## 2020-09-26 MED FILL — Glucose Blood Test Strip: 50 days supply | Qty: 100 | Fill #0 | Status: CN

## 2020-10-03 ENCOUNTER — Other Ambulatory Visit (HOSPITAL_BASED_OUTPATIENT_CLINIC_OR_DEPARTMENT_OTHER): Payer: Self-pay

## 2020-10-03 ENCOUNTER — Other Ambulatory Visit: Payer: Self-pay | Admitting: Family Medicine

## 2020-10-03 MED ORDER — BISOPROLOL-HYDROCHLOROTHIAZIDE 5-6.25 MG PO TABS
1.0000 | ORAL_TABLET | Freq: Every day | ORAL | 1 refills | Status: DC
  Filled 2020-10-03: qty 90, 90d supply, fill #0
  Filled 2020-12-31: qty 90, 90d supply, fill #1

## 2020-10-03 MED ORDER — OMEPRAZOLE 40 MG PO CPDR
DELAYED_RELEASE_CAPSULE | Freq: Two times a day (BID) | ORAL | 1 refills | Status: DC
  Filled 2020-10-03: qty 180, 90d supply, fill #0

## 2020-10-03 MED FILL — Glucose Blood Test Strip: 50 days supply | Qty: 100 | Fill #0 | Status: CN

## 2020-10-06 ENCOUNTER — Other Ambulatory Visit (HOSPITAL_BASED_OUTPATIENT_CLINIC_OR_DEPARTMENT_OTHER): Payer: Self-pay

## 2020-10-13 ENCOUNTER — Other Ambulatory Visit (HOSPITAL_BASED_OUTPATIENT_CLINIC_OR_DEPARTMENT_OTHER): Payer: Self-pay

## 2020-10-13 MED FILL — Glucose Blood Test Strip: 50 days supply | Qty: 100 | Fill #0 | Status: AC

## 2020-10-14 ENCOUNTER — Telehealth: Payer: Self-pay | Admitting: Family Medicine

## 2020-10-14 ENCOUNTER — Other Ambulatory Visit: Payer: Self-pay

## 2020-10-14 ENCOUNTER — Other Ambulatory Visit (HOSPITAL_BASED_OUTPATIENT_CLINIC_OR_DEPARTMENT_OTHER): Payer: Self-pay

## 2020-10-14 DIAGNOSIS — E1165 Type 2 diabetes mellitus with hyperglycemia: Secondary | ICD-10-CM

## 2020-10-14 MED ORDER — FREESTYLE LIBRE 2 READER DEVI
3 refills | Status: DC
  Filled 2020-10-14 (×2): qty 1, 30d supply, fill #0

## 2020-10-14 MED ORDER — FREESTYLE LIBRE 2 SENSOR MISC
6 refills | Status: DC
  Filled 2020-10-14: qty 2, 28d supply, fill #0

## 2020-10-14 NOTE — Telephone Encounter (Signed)
Sent in monitor

## 2020-10-14 NOTE — Telephone Encounter (Signed)
Vinnie Level from [employer] (patient current health insurance) states patient have a high possibility for qualifying for a continuous glucose monitor.   She explained patient is part of the chronic diabetic group and have some additional benefits that might will help her qualify.  Send script for [employer] II to  [facility] Phone:  [phone]  Fax:  [phone]

## 2020-10-21 ENCOUNTER — Other Ambulatory Visit (HOSPITAL_BASED_OUTPATIENT_CLINIC_OR_DEPARTMENT_OTHER): Payer: Self-pay

## 2020-10-29 ENCOUNTER — Other Ambulatory Visit: Payer: Self-pay | Admitting: Family Medicine

## 2020-10-29 ENCOUNTER — Other Ambulatory Visit: Payer: Self-pay | Admitting: Internal Medicine

## 2020-10-29 ENCOUNTER — Other Ambulatory Visit (HOSPITAL_BASED_OUTPATIENT_CLINIC_OR_DEPARTMENT_OTHER): Payer: Self-pay

## 2020-10-29 MED ORDER — ROSUVASTATIN CALCIUM 5 MG PO TABS
5.0000 mg | ORAL_TABLET | Freq: Every day | ORAL | 1 refills | Status: DC
  Filled 2020-10-29: qty 90, 90d supply, fill #0

## 2020-10-29 MED ORDER — METFORMIN HCL 1000 MG PO TABS
ORAL_TABLET | Freq: Two times a day (BID) | ORAL | 3 refills | Status: DC
  Filled 2020-10-29: qty 180, 90d supply, fill #0

## 2020-10-29 MED FILL — Glipizide Tab 5 MG: ORAL | 90 days supply | Qty: 315 | Fill #0 | Status: AC

## 2020-11-17 ENCOUNTER — Other Ambulatory Visit (INDEPENDENT_AMBULATORY_CARE_PROVIDER_SITE_OTHER): Payer: HMO

## 2020-11-17 ENCOUNTER — Other Ambulatory Visit: Payer: Self-pay

## 2020-11-17 DIAGNOSIS — E782 Mixed hyperlipidemia: Secondary | ICD-10-CM | POA: Diagnosis not present

## 2020-11-17 LAB — CBC WITH DIFFERENTIAL/PLATELET
Basophils Absolute: 0 10*3/uL (ref 0.0–0.1)
Basophils Relative: 0.5 % (ref 0.0–3.0)
Eosinophils Absolute: 0.1 10*3/uL (ref 0.0–0.7)
Eosinophils Relative: 1.3 % (ref 0.0–5.0)
HCT: 37.8 % (ref 36.0–46.0)
Hemoglobin: 12.6 g/dL (ref 12.0–15.0)
Lymphocytes Relative: 29.8 % (ref 12.0–46.0)
Lymphs Abs: 1.6 10*3/uL (ref 0.7–4.0)
MCHC: 33.4 g/dL (ref 30.0–36.0)
MCV: 79.6 fl (ref 78.0–100.0)
Monocytes Absolute: 0.5 10*3/uL (ref 0.1–1.0)
Monocytes Relative: 8.5 % (ref 3.0–12.0)
Neutro Abs: 3.3 10*3/uL (ref 1.4–7.7)
Neutrophils Relative %: 59.9 % (ref 43.0–77.0)
Platelets: 231 10*3/uL (ref 150.0–400.0)
RBC: 4.75 Mil/uL (ref 3.87–5.11)
RDW: 14.4 % (ref 11.5–15.5)
WBC: 5.5 10*3/uL (ref 4.0–10.5)

## 2020-11-17 LAB — LIPID PANEL
Cholesterol: 148 mg/dL (ref 0–200)
HDL: 39.7 mg/dL (ref >39.00)
LDL Cholesterol: 73 mg/dL (ref 0–99)
NonHDL: 107.96
Total CHOL/HDL Ratio: 4
Triglycerides: 176 mg/dL — ABNORMAL HIGH (ref 0.0–149.0)
VLDL: 35.2 mg/dL (ref 0.0–40.0)

## 2020-11-17 LAB — COMPREHENSIVE METABOLIC PANEL
ALT: 26 U/L (ref 0–35)
AST: 17 U/L (ref 0–37)
Albumin: 4.2 g/dL (ref 3.5–5.2)
Alkaline Phosphatase: 69 U/L (ref 39–117)
BUN: 22 mg/dL (ref 6–23)
CO2: 26 mEq/L (ref 19–32)
Calcium: 9.6 mg/dL (ref 8.4–10.5)
Chloride: 103 mEq/L (ref 96–112)
Creatinine, Ser: 1.04 mg/dL (ref 0.40–1.20)
GFR: 55.35 mL/min — ABNORMAL LOW (ref >60.00)
Glucose, Bld: 145 mg/dL — ABNORMAL HIGH (ref 70–99)
Potassium: 3.9 mEq/L (ref 3.5–5.1)
Sodium: 140 mEq/L (ref 135–145)
Total Bilirubin: 0.4 mg/dL (ref 0.2–1.2)
Total Protein: 7 g/dL (ref 6.0–8.3)

## 2020-11-24 ENCOUNTER — Ambulatory Visit: Payer: HMO | Attending: Internal Medicine

## 2020-11-24 ENCOUNTER — Other Ambulatory Visit (HOSPITAL_BASED_OUTPATIENT_CLINIC_OR_DEPARTMENT_OTHER): Payer: Self-pay

## 2020-11-24 DIAGNOSIS — Z23 Encounter for immunization: Secondary | ICD-10-CM

## 2020-11-24 NOTE — Progress Notes (Signed)
   Covid-19 Vaccination Clinic  Name:  [name]    MRN: [id number] DOB: 02-20-1951  11/24/2020  [name] was observed post Covid-19 immunization for 15 minutes without incident. She was provided with Vaccine Information Sheet and instruction to access the V-Safe system.   [name] was instructed to call 911 with any severe reactions post vaccine: Difficulty breathing  Swelling of face and throat  A fast heartbeat  A bad rash all over body  Dizziness and weakness   Immunizations Administered     Name Date Dose VIS Date Route   PFIZER Comrnaty(Gray TOP) Covid-19 Vaccine 11/24/2020  2:50 PM 0.3 mL 05/22/2020 Intramuscular   Manufacturer: [employer]   Lot: [id number]   [place]: [phone]

## 2020-11-25 ENCOUNTER — Other Ambulatory Visit (HOSPITAL_BASED_OUTPATIENT_CLINIC_OR_DEPARTMENT_OTHER): Payer: Self-pay

## 2020-11-25 MED ORDER — COVID-19 MRNA VAC-TRIS(PFIZER) 30 MCG/0.3ML IM SUSP
INTRAMUSCULAR | 0 refills | Status: DC
  Filled 2020-11-25: qty 0.3, 1d supply, fill #0

## 2020-12-01 ENCOUNTER — Encounter: Payer: Self-pay | Admitting: Physician Assistant

## 2020-12-09 ENCOUNTER — Other Ambulatory Visit (HOSPITAL_BASED_OUTPATIENT_CLINIC_OR_DEPARTMENT_OTHER): Payer: Self-pay

## 2020-12-09 ENCOUNTER — Other Ambulatory Visit: Payer: Self-pay

## 2020-12-09 ENCOUNTER — Encounter: Payer: Self-pay | Admitting: Internal Medicine

## 2020-12-09 ENCOUNTER — Ambulatory Visit (INDEPENDENT_AMBULATORY_CARE_PROVIDER_SITE_OTHER): Payer: HMO | Admitting: Internal Medicine

## 2020-12-09 VITALS — BP 124/80 | HR 76 | Ht 63.0 in | Wt 192.0 lb

## 2020-12-09 DIAGNOSIS — E119 Type 2 diabetes mellitus without complications: Secondary | ICD-10-CM

## 2020-12-09 LAB — POCT GLYCOSYLATED HEMOGLOBIN (HGB A1C): Hemoglobin A1C: 6.9 % — AB (ref 4.0–5.6)

## 2020-12-09 MED ORDER — GLIPIZIDE 5 MG PO TABS
5.0000 mg | ORAL_TABLET | Freq: Two times a day (BID) | ORAL | 3 refills | Status: DC
  Filled 2020-12-09 – 2021-03-02 (×2): qty 180, 90d supply, fill #0

## 2020-12-09 MED ORDER — METFORMIN HCL 1000 MG PO TABS
1000.0000 mg | ORAL_TABLET | Freq: Two times a day (BID) | ORAL | 3 refills | Status: DC
  Filled 2020-12-09 – 2021-01-13 (×2): qty 180, 90d supply, fill #0
  Filled 2021-04-27: qty 180, 90d supply, fill #1

## 2020-12-09 MED ORDER — RYBELSUS 7 MG PO TABS
7.0000 mg | ORAL_TABLET | Freq: Every day | ORAL | 3 refills | Status: DC
  Filled 2020-12-09: qty 30, 30d supply, fill #0
  Filled 2021-01-13: qty 30, 30d supply, fill #1

## 2020-12-09 NOTE — Progress Notes (Signed)
Name: [name]  Age/ Sex: 70 y.o., female   MRN/ DOB: [id number], Oct 26, 1950     PCP: Mosie Lukes, MD   Reason for Endocrinology Evaluation: Type 2 Diabetes Mellitus  Initial Endocrine Consultative Visit: 02/22/2017    PATIENT IDENTIFIER: [name] is a 70 y.o. female with a past medical history of T2DM, HTN , Dyslipidemia, Hx of breast Ca ( S/P right lumpectomy) . The patient has followed with Endocrinology clinic since 02/16/2019 for consultative assistance with management of her diabetes.  DIABETIC HISTORY:  [name] was diagnosed with T2DM in 2001. She has been on metformin, Glimepiride and Januvia. Invokana added in 2018 but was cost prohibitive. Her hemoglobin A1c has ranged from 7.0% in 2016, peaking at 8.8% in  2021.    She used to see Dr.Gherghe but was lost to follow up by 2019 , until her return to our clinic 08/2019.    Started Rybelsus 08/2020  SUBJECTIVE:   During the last visit (08/19/2020):  A1c 8.3 %. Increased Glipizide and continued Metformin , started Rybelsus      Today (12/09/2020): [name] is here for a follow up on diabetes management.  She checks her blood sugars 2 times daily The patient has had hypoglycemic episodes since the last clinic visit.   She was supposed to take 1.5 tabs of Glipizide before breakfast and supper but somehow went back up to 2 tabs before supper because of the bottle instructions   Weight stable  Denies diarrhea or nausea    HOME DIABETES REGIMEN:  Metformin 1000 mg, BID Glipizide 5 mg , 1.5 tablets BID Rybelsus 3 mg daily      Statin:  Crestor ACE-I/ARB: No   METER DOWNLOAD SUMMARY: 6/14-6/28/2022  Average Number Tests/Day = 1.9 Overall Mean FS Glucose = 136 Standard Deviation = 48  BG Ranges: Low = 79 High = 257   Hypoglycemic Events/30 Days: BG < 50 = 0 Episodes of symptomatic severe hypoglycemia = 0    DIABETIC COMPLICATIONS: Microvascular complications:   Denies:  CKD, retinopathy , neuropathy Last Eye Exam: Completed 12/2019  Macrovascular complications:   Denies: CAD, CVA, PVD   HISTORY:  Past Medical History:  Past Medical History:  Diagnosis Date   Acute bronchitis 10/31/2015   Allergy    seasonal   Arthritis    knee-left   Cancer ([place]) 04/2018   right breast ca   Cataract    right eye   Diabetes mellitus 52   type 2   Diaphragmatic hernia without mention of obstruction or gangrene    Endometriosis    Esophageal reflux    Esophageal stricture    GERD (gastroesophageal reflux disease)    Hyperlipidemia    Hyperplastic colon polyp    Hypertension 52   Nonalcoholic fatty liver disease 04/28/2010   Qualifier: Diagnosis of  By: Arnoldo Morale MD, John E    Nonspecific abnormal results of liver function study    Personal history of radiation therapy 08/2018   bilateral breasts   Pharyngitis 02/18/2012   Plantar fascial fibromatosis    Preventative health care 01/22/2013   Seasonal allergies 10/31/2015   Sleep apnea    has oral appliance but does not fit well due to missing teeth   Sore throat 04/03/2017   Tubular adenoma of colon    Unspecified sleep apnea    no c-pap   Past Surgical History:  Past Surgical History:  Procedure Laterality Date   ABDOMINAL  HYSTERECTOMY  1982   partial   APPENDECTOMY     benigh cyst in lung  2006   right   BREAST BIOPSY Bilateral 04/03/2018   BREAST BIOPSY Bilateral 03/23/2018   BREAST CYST INCISION AND DRAINAGE     right breast   BREAST EXCISIONAL BIOPSY Right 2019   BREAST LUMPECTOMY Bilateral 04/2018   BREAST LUMPECTOMY WITH RADIOACTIVE SEED LOCALIZATION Bilateral 05/02/2018   Procedure: RIGHT BREAST SEED LUMPECTOMY X2 AND LEFT BREAST SEED GUIDED LUMPECTOMY;  Surgeon: [name], MD;  Location: [facility];  Service: General;  Laterality: Bilateral;   CATARACT EXTRACTION     right eye   HERNIA REPAIR     per pt, she is not aware of this surgery   RE-EXCISION OF BREAST  LUMPECTOMY Right 05/25/2018   Procedure: RE-EXCISION OF RIGHT BREAST LUMPECTOMY;  Surgeon: [name], MD;  Location: [facility];  Service: General;  Laterality: Right;   seba     TUBAL LIGATION     Social History:  reports that she has never smoked. She has never used smokeless tobacco. She reports that she does not drink alcohol and does not use drugs. Family History:  Family History  Problem Relation Age of Onset   Breast cancer Mother    Hypertension Mother    Diabetes Mother        type 2   Other Mother        esophagus surgery   Heart disease Father    Hyperlipidemia Father    Hypertension Father    Heart attack Father        MI at age 77   Diabetes Brother        type 2   Heart disease Maternal Grandmother    Heart attack Maternal Grandfather    Hypertension Maternal Grandfather    Stroke Paternal Grandmother    Hypertension Sister    Diabetes Sister    Hypertension Brother    Alcohol abuse Maternal Aunt    Cancer Maternal Aunt    Colon cancer Neg Hx    Stomach cancer Neg Hx    Esophageal cancer Neg Hx      HOME MEDICATIONS: Allergies as of 12/09/2020       Reactions   Pravastatin Other (See Comments)   Myalgia, fatigue, memory loss   Amoxicillin Itching   REACTION: rash   Atorvastatin Other (See Comments)   Myalgia, fatigue, memory loss   Codeine Nausea And Vomiting        Medication List        Accurate as of December 09, 2020 10:32 AM. If you have any questions, ask your nurse or doctor.          albuterol 108 (90 Base) MCG/ACT inhaler Commonly known as: VENTOLIN HFA Inhale 2 puffs into the lungs every 6 (six) hours as needed for wheezing or shortness of breath.   Aspir-Low 81 MG EC tablet Generic drug: aspirin TAKE 1 TABLET BY MOUTH DAILY   bisoprolol-hydrochlorothiazide 5-6.25 MG tablet Commonly known as: ZIAC Take 1 tablet by mouth daily.   fluticasone 50 MCG/ACT nasal spray Commonly known as: FLONASE Place 2 sprays  into both nostrils daily.   FreeStyle Libre 2 Reader [employer] Use three times daily as needed   [employer] 2 Sensor Misc Use 1 sensor every 14 days as directed   glipiZIDE 5 MG tablet Commonly known as: GLUCOTROL TAKE 1&1/2 TABLETS (7.5 MG TOTAL) BY MOUTH DAILY BEFORE BREAKFAST AND 2  TABLETS (10 MG TOTAL) DAILY BEFORE SUPPER.   metFORMIN 1000 MG tablet Commonly known as: GLUCOPHAGE TAKE 1 TABLET (1,000 MG TOTAL) BY MOUTH 2 (TWO) TIMES DAILY WITH A MEAL.   montelukast 10 MG tablet Commonly known as: SINGULAIR Take 1 tablet (10 mg total) by mouth at bedtime as needed.   omeprazole 40 MG capsule Commonly known as: PRILOSEC TAKE 1 CAPSULE BY MOUTH TWICE DAILY   OneTouch Verio test strip Generic drug: glucose blood USE AS INSTRUCTED TWICE A DAY   OneTouch Verio test strip Generic drug: glucose blood USE AS INSTRUCTED TWICE DAILY   Pfizer-BioNTech COVID-19 Vacc 30 MCG/0.3ML injection Generic drug: COVID-19 mRNA vaccine (Pfizer) INJECT AS DIRECTED   Pfizer-BioNT COVID-19 Vac-TriS Susp injection Generic drug: COVID-19 mRNA Vac-TriS (Pfizer) Inject into the muscle.   rosuvastatin 5 MG tablet Commonly known as: CRESTOR Take 1 tablet (5 mg total) by mouth daily.   Rybelsus 3 MG Tabs Generic drug: Semaglutide TAKE 1 TABLET (3 MG) BY MOUTH DAILY BEFORE BREAKFAST.         OBJECTIVE:   Vital Signs: BP 124/80   Pulse 76   Ht 5\' 3"  (1.6 m)   Wt 192 lb (87.1 kg)   SpO2 96%   BMI 34.01 kg/m   Wt Readings from Last 3 Encounters:  12/09/20 192 lb (87.1 kg)  08/19/20 191 lb 4 oz (86.8 kg)  04/16/20 189 lb 1.6 oz (85.8 kg)     Exam: General: Pt appears well and is in NAD  Lungs: Clear with good BS bilat with no rales, rhonchi, or wheezes  Heart: RRR with normal S1 and S2 and no gallops; no murmurs; no rub  Skin: Cord-like thickening ~ 2.5 cm in length just above the right breast  Abdomen: Normoactive bowel sounds, soft, nontender, without masses or organomegaly  palpable  Extremities: No pretibial edema.   Neuro: MS is good with appropriate affect, pt is alert and Ox3     DM foot exam 12/09/2020  The skin of the feet is intact without sores or ulcerations. The pedal pulses are 2+ on right and 2+ on left. The sensation is intact to a screening 5.07, 10 gram monofilament bilaterally  DATA REVIEWED:  Lab Results  Component Value Date   HGBA1C 6.9 (A) 12/09/2020   HGBA1C 8.3 (A) 08/19/2020   HGBA1C 8.3 (A) 04/08/2020   Lab Results  Component Value Date   MICROALBUR <0.7 12/04/2015   LDLCALC 73 11/17/2020   CREATININE 1.04 11/17/2020   Lab Results  Component Value Date   MICRALBCREAT 1.0 12/04/2015     Lab Results  Component Value Date   CHOL 148 11/17/2020   HDL 39.70 11/17/2020   LDLCALC 73 11/17/2020   LDLDIRECT 96.0 08/19/2020   TRIG 176.0 (H) 11/17/2020   CHOLHDL 4 11/17/2020        Results for [name], [name] (MRN [id number]) as of 12/10/2020 13:50  Ref. Range 12/09/2020 10:54  Creatinine,U Latest Units: mg/dL 18.6  Microalb, Ur Latest Ref Range: 0.0 - 1.9 mg/dL <0.7  MICROALB/CREAT RATIO Latest Ref Range: 0.0 - 30.0 mg/g 3.8   ASSESSMENT / PLAN / RECOMMENDATIONS:   1) Type 2 Diabetes Mellitus,Optimally  Controlled, [place] complications - Most recent A1c of 6.9 %. Goal A1c < 7.0 %.      - Praised pt on improved glycemic control  - Bg's have been fluctuating but she attributes thisot going on vacation recently  - Will increase Rybelsus as below    MEDICATIONS:  -  Continue Metformin 1000 mg , 1 tablet with breakfast and dinner - Decrease Glipizide 5 mg, 1 tablet before breakfast and 1 tablet before dinner  - Increase Rybelsus 7 mg , 1 tablet before Breakfast    EDUCATION / INSTRUCTIONS: BG monitoring instructions: Patient is instructed to check her blood sugars 2 times a day, fasting and supper time. Call [facility] if: BG persistently < 70  I reviewed the Rule of 15 for the treatment of  hypoglycemia in detail with the patient. Literature supplied.    2) Diabetic complications:  Eye: Does not have known diabetic retinopathy.  Neuro/ Feet: Does not have known diabetic peripheral neuropathy .  Renal: Patient does not have known baseline CKD. She   is not on an ACEI/ARB at present.  MA/CR.normal   3) Dyslipidemia: Patient is on crestor 5 mg, taking half a tablet , this is all she could tolerate due to arthralgia. She is already on vitamin D. LDL at 85 mg/dL.     F/U in 4 months   Signed electronically by: Mack Guise, MD  [facility] Endocrin[facility]  [facility] [address] [place], [place] [address] Phone: [phone] FAX: [phone]   CC: Mosie Lukes, [address] [place] [address] Phone: [phone]  Fax: [phone]  Return to Endocrinology clinic as below: Future Appointments  Date Time Provider [place]  12/30/2020  9:00 AM Esterwood, Amy S, PA-C [facility]

## 2020-12-09 NOTE — Patient Instructions (Addendum)
-   Continue Metformin 1000 mg , 1 tablet with breakfast and 1 tablet with dinner - Decrease Glipizide 5 mg, 1 tablet before breakfast and 1 tablets before  dinner  - Increase Rybelsus 7 mg, 1 tablet before breakfast       HOW TO TREAT LOW BLOOD SUGARS (Blood sugar LESS THAN 70 MG/DL) Please follow the RULE OF 15 for the treatment of hypoglycemia treatment (when your (blood sugars are less than 70 mg/dL)   STEP 1: Take 15 grams of carbohydrates when your blood sugar is low, which includes:  3-4 GLUCOSE TABS  OR 3-4 OZ OF JUICE OR REGULAR SODA OR ONE TUBE OF GLUCOSE GEL    STEP 2: RECHECK blood sugar in 15 MINUTES STEP 3: If your blood sugar is still low at the 15 minute recheck --> then, go back to STEP 1 and treat AGAIN with another 15 grams of carbohydrates.

## 2020-12-10 ENCOUNTER — Encounter: Payer: Self-pay | Admitting: Internal Medicine

## 2020-12-10 LAB — MICROALBUMIN / CREATININE URINE RATIO
Creatinine,U: 18.6 mg/dL
Microalb Creat Ratio: 3.8 mg/g (ref 0.0–30.0)
Microalb, Ur: <0.7 mg/dL (ref 0.0–1.9)

## 2020-12-16 ENCOUNTER — Other Ambulatory Visit (HOSPITAL_BASED_OUTPATIENT_CLINIC_OR_DEPARTMENT_OTHER): Payer: Self-pay

## 2020-12-16 MED FILL — Glucose Blood Test Strip: 50 days supply | Qty: 100 | Fill #1 | Status: AC

## 2020-12-30 ENCOUNTER — Encounter: Payer: Self-pay | Admitting: Physician Assistant

## 2020-12-30 ENCOUNTER — Ambulatory Visit: Payer: HMO | Admitting: Physician Assistant

## 2020-12-30 ENCOUNTER — Telehealth: Payer: Self-pay | Admitting: Physician Assistant

## 2020-12-30 ENCOUNTER — Other Ambulatory Visit (HOSPITAL_BASED_OUTPATIENT_CLINIC_OR_DEPARTMENT_OTHER): Payer: Self-pay

## 2020-12-30 VITALS — BP 116/70 | HR 81 | Ht 63.0 in | Wt 187.8 lb

## 2020-12-30 DIAGNOSIS — Z86010 Personal history of colonic polyps: Secondary | ICD-10-CM | POA: Diagnosis not present

## 2020-12-30 DIAGNOSIS — R1011 Right upper quadrant pain: Secondary | ICD-10-CM

## 2020-12-30 DIAGNOSIS — K219 Gastro-esophageal reflux disease without esophagitis: Secondary | ICD-10-CM | POA: Diagnosis not present

## 2020-12-30 DIAGNOSIS — Z1211 Encounter for screening for malignant neoplasm of colon: Secondary | ICD-10-CM | POA: Diagnosis not present

## 2020-12-30 DIAGNOSIS — K625 Hemorrhage of anus and rectum: Secondary | ICD-10-CM | POA: Diagnosis not present

## 2020-12-30 MED ORDER — PLENVU 140 G PO SOLR
1.0000 | ORAL | 0 refills | Status: DC
  Filled 2020-12-30: qty 3, 2d supply, fill #0

## 2020-12-30 MED ORDER — OMEPRAZOLE 40 MG PO CPDR
DELAYED_RELEASE_CAPSULE | Freq: Two times a day (BID) | ORAL | 1 refills | Status: DC
  Filled 2020-12-30: qty 180, 90d supply, fill #0
  Filled 2021-04-01: qty 180, 90d supply, fill #1

## 2020-12-30 NOTE — Progress Notes (Signed)
Subjective:    Patient ID: [name], female    DOB: 1950-08-14, 70 y.o.   MRN: [id number]  HPI [name] is a pleasant 70 year old African-American female established with Dr. Fuller Plan who is due for follow-up colonoscopy but comes in today as she was also concerned about some possible rectal bleeding.  Patient last had colonoscopy in June 2017 done for history of adenomatous colon polyps.  She had 5 small sessile polyps removed all 3 to 4 mm in size, no internal hemorrhoids noted.  Path on the polyps showed 1 tubular adenoma and hyperplastic polyps. She also has history of chronic GERD and prior stricture with EGD in 2015 showing a distal esophageal stricture which was Savary dilated also with mild gastritis.  Biopsies of the stomach were benign and no evidence for H. pylori. She also has history of hypertension, osteoarthritis, nonalcoholic fatty liver disease, adult onset diabetes mellitus and DCIS of the breast 2019. Last ultrasound September 2021 done for complaints of right upper quadrant pain showed no evidence of gallstones, CBD of 4 mm and unchanged hepatic steatosis. CBC in June 2022 within normal limits. Patient says that she has been having some mild problems with constipation though for the most part still has a bowel movement daily.  She has an occasional right sided abdominal pain that she says will occur when she first wakes up in the morning radiates down into the groin area on the right and then around into her back this seems to ease up as soon as she gets up and moves around and may occur a couple of times per week and is brief. She has not definitely noticed any blood in her stool but has been noticing a very small spot of blood on her underwear frequently over the past several months. She continues on twice daily 40 mg omeprazole which helps control her reflux symptoms and has no  current complaints of dysphagia.  Review of Systems Pertinent positive and negative review of  systems were noted in the above HPI section.  All other review of systems was otherwise negative.   Outpatient Encounter Medications as of 12/30/2020  Medication Sig   albuterol (VENTOLIN HFA) 108 (90 Base) MCG/ACT inhaler Inhale 2 puffs into the lungs every 6 (six) hours as needed for wheezing or shortness of breath.   ASPIR-LOW 81 MG EC tablet TAKE 1 TABLET BY MOUTH DAILY   bisoprolol-hydrochlorothiazide (ZIAC) 5-6.25 MG tablet Take 1 tablet by mouth daily.   fluticasone (FLONASE) 50 MCG/ACT nasal spray Place 2 sprays into both nostrils daily.   glipiZIDE (GLUCOTROL) 5 MG tablet Take 1 tablet (5 mg total) by mouth 2 (two) times daily before a meal.   glucose blood test strip USE AS INSTRUCTED TWICE A DAY   metFORMIN (GLUCOPHAGE) 1000 MG tablet Take 1 tablet (1,000 mg total) by mouth 2 (two) times daily with a meal.   montelukast (SINGULAIR) 10 MG tablet Take 1 tablet (10 mg total) by mouth at bedtime as needed.   PEG-KCl-NaCl-NaSulf-Na Asc-C (PLENVU) 140 g SOLR Take 1 kit by mouth as directed.   rosuvastatin (CRESTOR) 5 MG tablet Take 1 tablet (5 mg total) by mouth daily.   Semaglutide (RYBELSUS) 7 MG TABS Take 1 tablet by mouth daily.   [DISCONTINUED] omeprazole (PRILOSEC) 40 MG capsule TAKE 1 CAPSULE BY MOUTH TWICE DAILY   omeprazole (PRILOSEC) 40 MG capsule TAKE 1 CAPSULE BY MOUTH TWICE DAILY   [DISCONTINUED] COVID-19 mRNA Vac-TriS, Pfizer, SUSP injection Inject into the muscle.   [  DISCONTINUED] COVID-19 mRNA vaccine, Pfizer, 30 MCG/0.3ML injection INJECT AS DIRECTED   [DISCONTINUED] glucose blood test strip USE AS INSTRUCTED TWICE DAILY   Facility-Administered Encounter Medications as of 12/30/2020  Medication   Chlorhexidine Gluconate Cloth 2 % PADS 6 each   And   Chlorhexidine Gluconate Cloth 2 % PADS 6 each   Allergies  Allergen Reactions   Pravastatin Other (See Comments)    Myalgia, fatigue, memory loss   Amoxicillin Itching    REACTION: rash   Atorvastatin Other (See  Comments)    Myalgia, fatigue, memory loss   Codeine Nausea And Vomiting   Patient Active Problem List   Diagnosis Date Noted   Diabetes mellitus without complication ([place]) 71/21/9758   Chest wall asymmetry 12/05/2019   Uncontrolled type 2 diabetes mellitus with hyperglycemia ([place]) 12/05/2019   Insomnia 07/23/2019   Shingles 02/18/2019   Ductal carcinoma in situ (DCIS) of left breast 07/03/2018   Vertigo 03/13/2018   Ductal carcinoma in situ (DCIS) of right breast 03/10/2018   Bilateral calf pain 10/03/2017   Right shoulder pain 10/03/2017   Headache 06/30/2017   Obesity (BMI 30-39.9) 05/02/2017   Seasonal allergies 10/31/2015   Hx of adenomatous colonic polyps 04/30/2014   Cervical cancer screening 01/22/2013   Preventative health care 01/22/2013   Chest pain 01/06/2012   Endometriosis    Chronic cough 83/25/4982   Nonalcoholic fatty liver disease 04/28/2010   OSA (obstructive sleep apnea) 03/26/2009   RT BUNDLE BRANCH BLOCK&LT ANT FASCICULAR BLOCK 12/20/2008   Thoracic back pain 10/10/2008   Osteoarthritis 07/05/2008   Abdominal pain 07/05/2008   Aneurysm, thoracoabdominal ([place]) 03/22/2007   Type 2 diabetes mellitus with hyperglycemia, without long-term current use of insulin ([place]) 10/28/2006   Hyperlipidemia, mixed 10/28/2006   Depression with anxiety 10/28/2006   Essential hypertension 10/28/2006   ESOPHAGEAL STRICTURE 05/04/2004   HIATAL HERNIA 05/04/2004   Social History   Socioeconomic History   Marital status: Married    Spouse name: English as a second language teacher   Number of children: 2   Years of education: Not on file   Highest education level: Not on file  Occupational History   Occupation: Building surveyor: [employer]  Tobacco Use   Smoking status: Never   Smokeless tobacco: Never  Substance and Sexual Activity   Alcohol use: No    Alcohol/week: 0.0 standard drinks   Drug use: No   Sexual activity: Yes    Birth control/protection: Post-menopausal    Comment:  lives with husband, no dietary restrictions just watching carbs, wears seat belt  Other Topics Concern   Not on file  Social History Narrative   Not on file   Social Determinants of Health   Financial Resource Strain: Not on file  Food Insecurity: No Food Insecurity   Worried About Estate manager/land agent of Food in the Last Year: Never true   Ran Out of Food in the Last Year: Never true  Transportation Needs: No Transportation Needs   Lack of Transportation (Medical): No   Lack of Transportation (Non-Medical): No  Physical Activity: Not on file  Stress: Not on file  Social Connections: Not on file  Intimate Partner Violence: Not on file    Ms. Dorris's family history includes Alcohol abuse in her maternal aunt; Breast cancer in her mother; Cancer in her maternal aunt; Diabetes in her brother, mother, and sister; Heart attack in her father and maternal grandfather; Heart disease in her father and maternal grandmother; Hyperlipidemia in her father; Hypertension  in her brother, father, maternal grandfather, mother, and sister; Other in her mother; Stroke in her paternal grandmother.      Objective:    Vitals:   12/30/20 0907  BP: 116/70  Pulse: 81    Physical Exam.Well-developed well-nourished AA female in no acute distress.  Height, Weight,187  BMI 33.2  HEENT; nontraumatic normocephalic, EOMI, PE R LA, sclera anicteric. Oropharynx;not examined today  Neck; supple, no JVD Cardiovascular; regular rate and rhythm with S1-S2, no murmur rub or gallop Pulmonary; Clear bilaterally Abdomen; soft, no focal tenderness nondistended, no palpable mass or hepatosplenomegaly, bowel sounds are active Rectal; not done today Skin; benign exam, no jaundice rash or appreciable lesions Extremities; no clubbing cyanosis or edema skin warm and dry Neuro/Psych; alert and oriented x4, grossly nonfocal mood and affect appropriate        Assessment & Plan:   #62 69 year old female with history of  adenomatous colon polyps, due for follow-up colonoscopy, last exam June 2017. Complaints of very small volume hematochezia with spots of blood noticed on the underclothes over the past several months.  Rule out small external hemorrhoid or other local anal rectal irritation  Rule out occult colon lesion  #2 GERD with history of peptic stricture requiring dilation 2015-symptoms under good control with twice daily PPI, no recurrent dysphagia #3 adult onset diabetes mellitus 4.  History of DCIS of the breast 2019 5.  Nonalcoholic fatty liver disease ultrasound September 2021 unchanged hepatic steatosis 6.  Hypertension 7.  Osteoarthritis  Plan ;Patient will be scheduled for colonoscopy with Dr. Fuller Plan.  Procedure was discussed in detail with the patient including indications risk and benefits and she is agreeable to proceed.  For mild constipation increase fluid intake to 60 ounces per day, start eating 3-4 prunes or figs daily. Continue twice daily omeprazole 40 mg Further recommendations pending findings at colonoscopy   Alfredia Ferguson PA-C 12/30/2020   Cc: Mosie Lukes, MD

## 2020-12-30 NOTE — Patient Instructions (Signed)
If you are age 68 or older, your body mass index should be between 23-30. Your Body mass index is 33.27 kg/m. If this is out of the aforementioned range listed, please consider follow up with your Primary Care Provider. __________________________________________________________  The [facility] providers would like to encourage you to use [facility] to communicate with providers for non-urgent requests or questions.  Due to long hold times on the telephone, sending your provider a message by [facility] may be a faster and more efficient way to get a response.  Please allow 48 business hours for a response.  Please remember that this is for non-urgent requests.   You have been scheduled for a colonoscopy. Please follow written instructions given to you at your visit today.  Please pick up your prep supplies at the pharmacy within the next 1-3 days. If you use inhalers (even only as needed), please bring them with you on the day of your procedure.  Continue your Omeprazole 40 mg 1 capsule twice daily  Increase your water intake to 60 ounces daily  Eat figs and/or prunes 3-4 times daily for constipation symptoms.  Follow up as needed.  Thank you for entrusting me with your care and choosing [facility].  Amy Esterwood, PA-C

## 2020-12-30 NOTE — Telephone Encounter (Signed)
Inbound call from pharmacy stating Plenvu is not covered by patient's insurance and they will not be able to use coupon that was sent with script.  They're requesting alternate prep be sent to pharmacy.  Please advise.

## 2020-12-30 NOTE — Progress Notes (Signed)
Reviewed and agree with management plan.  Adley Castello T. Halley Kincer, MD FACG 

## 2020-12-30 NOTE — Telephone Encounter (Signed)
Spoke with pharmacy, advised that I would fax a new discount card for Medicare Part D. Card has been activated and faxed.

## 2020-12-31 ENCOUNTER — Other Ambulatory Visit (HOSPITAL_BASED_OUTPATIENT_CLINIC_OR_DEPARTMENT_OTHER): Payer: Self-pay

## 2021-01-01 ENCOUNTER — Other Ambulatory Visit: Payer: Self-pay

## 2021-01-01 ENCOUNTER — Encounter: Payer: Self-pay | Admitting: Gastroenterology

## 2021-01-01 ENCOUNTER — Ambulatory Visit (AMBULATORY_SURGERY_CENTER): Payer: HMO | Admitting: Gastroenterology

## 2021-01-01 VITALS — BP 126/72 | HR 75 | Temp 97.7°F | Resp 18 | Ht 63.0 in | Wt 187.0 lb

## 2021-01-01 DIAGNOSIS — D125 Benign neoplasm of sigmoid colon: Secondary | ICD-10-CM

## 2021-01-01 DIAGNOSIS — D128 Benign neoplasm of rectum: Secondary | ICD-10-CM | POA: Diagnosis not present

## 2021-01-01 DIAGNOSIS — D122 Benign neoplasm of ascending colon: Secondary | ICD-10-CM

## 2021-01-01 DIAGNOSIS — Z86010 Personal history of colonic polyps: Secondary | ICD-10-CM | POA: Diagnosis not present

## 2021-01-01 DIAGNOSIS — D123 Benign neoplasm of transverse colon: Secondary | ICD-10-CM | POA: Diagnosis not present

## 2021-01-01 MED ORDER — SODIUM CHLORIDE 0.9 % IV SOLN
500.0000 mL | Freq: Once | INTRAVENOUS | Status: DC
Start: 2021-01-01 — End: 2021-01-01

## 2021-01-01 NOTE — Patient Instructions (Signed)
  Handout on polyps and hemorrhoids given.    YOU HAD AN ENDOSCOPIC PROCEDURE TODAY AT [facility]:   Refer to the procedure report that was given to you for any specific questions about what was found during the examination.  If the procedure report does not answer your questions, please call your gastroenterologist to clarify.  If you requested that your care partner not be given the details of your procedure findings, then the procedure report has been included in a sealed envelope for you to review at your convenience later.  YOU SHOULD EXPECT: Some feelings of bloating in the abdomen. Passage of more gas than usual.  Walking can help get rid of the air that was put into your GI tract during the procedure and reduce the bloating. If you had a lower endoscopy (such as a colonoscopy or flexible sigmoidoscopy) you may notice spotting of blood in your stool or on the toilet paper. If you underwent a bowel prep for your procedure, you may not have a normal bowel movement for a few days.  Please Note:  You might notice some irritation and congestion in your nose or some drainage.  This is from the oxygen used during your procedure.  There is no need for concern and it should clear up in a day or so.  SYMPTOMS TO REPORT IMMEDIATELY:  Following lower endoscopy (colonoscopy or flexible sigmoidoscopy):  Excessive amounts of blood in the stool  Significant tenderness or worsening of abdominal pains  Swelling of the abdomen that is new, acute  Fever of 100F or higher   For urgent or emergent issues, a gastroenterologist can be reached at any hour by calling [phone]. Do not use MyChart messaging for urgent concerns.    DIET:  We do recommend a small meal at first, but then you may proceed to your regular diet.  Drink plenty of fluids but you should avoid alcoholic beverages for 24 hours.  ACTIVITY:  You should plan to take it easy for the rest of today and you should NOT  DRIVE or use heavy machinery until tomorrow (because of the sedation medicines used during the test).    FOLLOW UP: Our staff will call the number listed on your records 48-72 hours following your procedure to check on you and address any questions or concerns that you may have regarding the information given to you following your procedure. If we do not reach you, we will leave a message.  We will attempt to reach you two times.  During this call, we will ask if you have developed any symptoms of COVID 19. If you develop any symptoms (ie: fever, flu-like symptoms, shortness of breath, cough etc.) before then, please call [phone].  If you test positive for Covid 19 in the 2 weeks post procedure, please call and report this information to [place].    If any biopsies were taken you will be contacted by phone or by letter within the next 1-3 weeks.  Please call us at [phone] if you have not heard about the biopsies in 3 weeks.    SIGNATURES/CONFIDENTIALITY: You and/or your care partner have signed paperwork which will be entered into your electronic medical record.  These signatures attest to the fact that that the information above on your After Visit Summary has been reviewed and is understood.  Full responsibility of the confidentiality of this discharge information lies with you and/or your care-partner.

## 2021-01-01 NOTE — Progress Notes (Signed)
Called to room to assist during endoscopic procedure.  Patient ID and intended procedure confirmed with present staff. Received instructions for my participation in the procedure from the performing physician.  

## 2021-01-01 NOTE — Op Note (Signed)
[facility] Patient Name: [name] Procedure Date: 01/01/2021 8:04 AM MRN: [id number] Endoscopist: Ladene Artist , MD Age: 70 Referring MD:  Date of Birth: 10-07-1950 Gender: Female Account #: [id number] Procedure:                Colonoscopy Indications:              Surveillance: Personal history of adenomatous                            polyps on last colonoscopy 5 years ago Medicines:                Monitored Anesthesia Care Procedure:                Pre-Anesthesia Assessment:                           - Prior to the procedure, a History and Physical                            was performed, and patient medications and                            allergies were reviewed. The patient's tolerance of                            previous anesthesia was also reviewed. The risks                            and benefits of the procedure and the sedation                            options and risks were discussed with the patient.                            All questions were answered, and informed consent                            was obtained. Prior Anticoagulants: The patient has                            taken no previous anticoagulant or antiplatelet                            agents. ASA Grade Assessment: III - A patient with                            severe systemic disease. After reviewing the risks                            and benefits, the patient was deemed in                            satisfactory condition to undergo the procedure.  After obtaining informed consent, the colonoscope                            was passed under direct vision. Throughout the                            procedure, the patient's blood pressure, pulse, and                            oxygen saturations were monitored continuously. The                            CF [id number] #[id number] was introduced through the anus                            and advanced to  the the cecum, identified by                            appendiceal orifice and ileocecal valve. The                            ileocecal valve, appendiceal orifice, and rectum                            were photographed. The quality of the bowel                            preparation was good. The colonoscopy was performed                            without difficulty. The patient tolerated the                            procedure well. Scope In: 8:08:57 AM Scope Out: 8:27:18 AM Scope Withdrawal Time: 0 hours 15 minutes 59 seconds  Total Procedure Duration: 0 hours 18 minutes 21 seconds  Findings:                 The perianal and digital rectal examinations were                            normal.                           A 3 mm polyp was found in the ascending colon. The                            polyp was sessile. The polyp was removed with a                            cold biopsy forceps. Resection and retrieval were                            complete.  Four sessile polyps were found in the rectum,                            sigmoid colon and transverse colon (2). The polyps                            were 5 to 6 mm in size. These polyps were removed                            with a cold snare. Resection and retrieval were                            complete.                           Internal hemorrhoids were found during                            retroflexion. The hemorrhoids were small and Grade                            I (internal hemorrhoids that do not prolapse).                           The exam was otherwise without abnormality on                            direct and retroflexion views. Complications:            No immediate complications. Estimated blood loss:                            None. Estimated Blood Loss:     Estimated blood loss: none. Impression:               - One 3 mm polyp in the ascending colon, removed                             with a cold biopsy forceps. Resected and retrieved.                           - Four 5 to 6 mm polyps in the rectum, in the                            sigmoid colon and in the transverse colon, removed                            with a cold snare. Resected and retrieved.                           - Internal hemorrhoids.                           - The examination was otherwise normal on direct  and retroflexion views. Recommendation:           - Repeat colonoscopy after studies are complete for                            surveillance based on pathology results.                           - Patient has a contact number available for                            emergencies. The signs and symptoms of potential                            delayed complications were discussed with the                            patient. Return to normal activities tomorrow.                            Written discharge instructions were provided to the                            patient.                           - Resume previous diet.                           - Continue present medications.                           - Await pathology results. Ladene Artist, MD 01/01/2021 8:30:56 AM This report has been signed electronically.

## 2021-01-01 NOTE — Progress Notes (Signed)
pt tolerated well. VSS. awake and to recovery. Report given to RN.  

## 2021-01-01 NOTE — Progress Notes (Signed)
Pt's states no medical or surgical changes since previsit or office visit.   CW vtials and JB IV.

## 2021-01-05 ENCOUNTER — Telehealth: Payer: Self-pay | Admitting: *Deleted

## 2021-01-05 NOTE — Telephone Encounter (Signed)
  Follow up Call-  Call back number 01/01/2021  Post procedure Call Back phone  # [phone]  Permission to leave phone message Yes  Some recent data might be hidden     Patient questions:  Do you have a fever, pain , or abdominal swelling? No. Pain Score  0 *  Have you tolerated food without any problems? Yes.    Have you been able to return to your normal activities? Yes.    Do you have any questions about your discharge instructions: Diet   No. Medications  No. Follow up visit  No.  Do you have questions or concerns about your Care? No.  Actions: * If pain score is 4 or above: No action needed, pain <4.   Have you developed a fever since your procedure? no  2.   Have you had an respiratory symptoms (SOB or cough) since your procedure? no  3.   Have you tested positive for COVID 19 since your procedure no  4.   Have you had any family members/close contacts diagnosed with the COVID 19 since your procedure?  no   If yes to any of these questions please route to Joylene John, RN and Joella Prince, RN

## 2021-01-09 ENCOUNTER — Encounter: Payer: Self-pay | Admitting: Gastroenterology

## 2021-01-09 DIAGNOSIS — E119 Type 2 diabetes mellitus without complications: Secondary | ICD-10-CM | POA: Diagnosis not present

## 2021-01-09 DIAGNOSIS — Z961 Presence of intraocular lens: Secondary | ICD-10-CM | POA: Diagnosis not present

## 2021-01-09 DIAGNOSIS — H524 Presbyopia: Secondary | ICD-10-CM | POA: Diagnosis not present

## 2021-01-09 LAB — HM DIABETES EYE EXAM

## 2021-01-13 ENCOUNTER — Encounter: Payer: Self-pay | Admitting: *Deleted

## 2021-01-13 ENCOUNTER — Other Ambulatory Visit (HOSPITAL_BASED_OUTPATIENT_CLINIC_OR_DEPARTMENT_OTHER): Payer: Self-pay

## 2021-01-14 ENCOUNTER — Other Ambulatory Visit (HOSPITAL_BASED_OUTPATIENT_CLINIC_OR_DEPARTMENT_OTHER): Payer: Self-pay

## 2021-01-15 ENCOUNTER — Other Ambulatory Visit: Payer: Self-pay | Admitting: Adult Health

## 2021-01-15 ENCOUNTER — Encounter (HOSPITAL_BASED_OUTPATIENT_CLINIC_OR_DEPARTMENT_OTHER): Payer: Self-pay

## 2021-01-15 ENCOUNTER — Other Ambulatory Visit: Payer: Self-pay

## 2021-01-15 ENCOUNTER — Emergency Department (HOSPITAL_BASED_OUTPATIENT_CLINIC_OR_DEPARTMENT_OTHER): Payer: HMO

## 2021-01-15 ENCOUNTER — Emergency Department (HOSPITAL_BASED_OUTPATIENT_CLINIC_OR_DEPARTMENT_OTHER)
Admission: EM | Admit: 2021-01-15 | Discharge: 2021-01-15 | Disposition: A | Discharge status: Home / Self Care | Payer: HMO | Attending: Emergency Medicine | Admitting: Emergency Medicine

## 2021-01-15 DIAGNOSIS — R002 Palpitations: Secondary | ICD-10-CM

## 2021-01-15 DIAGNOSIS — Z20822 Contact with and (suspected) exposure to covid-19: Secondary | ICD-10-CM | POA: Insufficient documentation

## 2021-01-15 DIAGNOSIS — Z9889 Other specified postprocedural states: Secondary | ICD-10-CM

## 2021-01-15 DIAGNOSIS — I10 Essential (primary) hypertension: Secondary | ICD-10-CM | POA: Diagnosis not present

## 2021-01-15 DIAGNOSIS — Z853 Personal history of malignant neoplasm of breast: Secondary | ICD-10-CM

## 2021-01-15 DIAGNOSIS — E119 Type 2 diabetes mellitus without complications: Secondary | ICD-10-CM | POA: Insufficient documentation

## 2021-01-15 DIAGNOSIS — R0602 Shortness of breath: Secondary | ICD-10-CM | POA: Diagnosis not present

## 2021-01-15 LAB — BASIC METABOLIC PANEL
Anion gap: 11 (ref 5–15)
BUN: 16 mg/dL (ref 8–23)
CO2: 26 mmol/L (ref 22–32)
Calcium: 9.3 mg/dL (ref 8.9–10.3)
Chloride: 101 mmol/L (ref 98–111)
Creatinine, Ser: 1 mg/dL (ref 0.44–1.00)
GFR, Estimated: >60 mL/min (ref >60)
Glucose, Bld: 119 mg/dL — ABNORMAL HIGH (ref 70–99)
Potassium: 3.8 mmol/L (ref 3.5–5.1)
Sodium: 138 mmol/L (ref 135–145)

## 2021-01-15 LAB — CBC
HCT: 39.1 % (ref 36.0–46.0)
Hemoglobin: 12.8 g/dL (ref 12.0–15.0)
MCH: 26 pg (ref 26.0–34.0)
MCHC: 32.7 g/dL (ref 30.0–36.0)
MCV: 79.3 fL — ABNORMAL LOW (ref 80.0–100.0)
Platelets: 264 10*3/uL (ref 150–400)
RBC: 4.93 MIL/uL (ref 3.87–5.11)
RDW: 13.7 % (ref 11.5–15.5)
WBC: 6.6 10*3/uL (ref 4.0–10.5)
nRBC: 0 % (ref 0.0–0.2)

## 2021-01-15 LAB — RESP PANEL BY RT-PCR (FLU A&B, COVID) ARPGX2
Influenza A by PCR: NEGATIVE
Influenza B by PCR: NEGATIVE
SARS Coronavirus 2 by RT PCR: NEGATIVE

## 2021-01-15 LAB — HEPATIC FUNCTION PANEL
ALT: 37 U/L (ref 0–44)
AST: 29 U/L (ref 15–41)
Albumin: 4.1 g/dL (ref 3.5–5.0)
Alkaline Phosphatase: 65 U/L (ref 38–126)
Bilirubin, Direct: 0.1 mg/dL (ref 0.0–0.2)
Indirect Bilirubin: 0.3 mg/dL (ref 0.3–0.9)
Total Bilirubin: 0.4 mg/dL (ref 0.3–1.2)
Total Protein: 7.5 g/dL (ref 6.5–8.1)

## 2021-01-15 LAB — MAGNESIUM: Magnesium: 1.6 mg/dL — ABNORMAL LOW (ref 1.7–2.4)

## 2021-01-15 LAB — LIPASE, BLOOD: Lipase: 62 U/L — ABNORMAL HIGH (ref 11–51)

## 2021-01-15 LAB — TROPONIN I (HIGH SENSITIVITY): Troponin I (High Sensitivity): 3 ng/L (ref <18)

## 2021-01-15 MED ORDER — ALBUTEROL SULFATE HFA 108 (90 BASE) MCG/ACT IN AERS: 2.0000 | INHALATION_SPRAY | RESPIRATORY_TRACT | Status: DC | PRN

## 2021-01-15 MED ORDER — MAGNESIUM 30 MG PO TABS: 30.0000 mg | ORAL_TABLET | Freq: Every day | ORAL | 0 refills | Status: AC

## 2021-01-15 MED ORDER — SODIUM CHLORIDE 0.9 % IV BOLUS
500.0000 mL | Freq: Once | INTRAVENOUS | Status: AC
  Administered 2021-01-15: 500 mL via INTRAVENOUS

## 2021-01-15 NOTE — ED Triage Notes (Signed)
Feels as though heart is fluttering and having shortness of breath that started 3 days ago.  Hx of asthma but clear bilaterally in triage.

## 2021-01-15 NOTE — ED Provider Notes (Signed)
Emergency Department Provider Note   I have reviewed the triage vital signs and the nursing notes.   HISTORY  Chief Complaint Shortness of Breath   HPI [name] is a 70 y.o. female with past medical history reviewed below presents to the emergency department with palpitations symptoms in the chest and anterior throat.  She been feeling slightly short of breath.  Symptoms have been present for the past 3 days.  She has a remote history of asthma but does not require inhalers nearly at all.  She is not had fevers but has had some occasional body aches along with some congestion.  She states she did have this sensation in the remote past related to an upper respiratory infection.  She is not having chest pain.  At the time of my evaluation and the ER EKG, she was having symptoms.   Past Medical History:  Diagnosis Date   Acute bronchitis 10/31/2015   Allergy    seasonal   Arthritis    knee-left   Cancer ([place]) 04/2018   right breast ca   Cataract    right eye   Diabetes mellitus 52   type 2   Diaphragmatic hernia without mention of obstruction or gangrene    Endometriosis    Esophageal reflux    Esophageal stricture    GERD (gastroesophageal reflux disease)    Hyperlipidemia    Hyperplastic colon polyp    Hypertension 52   Nonalcoholic fatty liver disease 04/28/2010   Qualifier: Diagnosis of  By: Arnoldo Morale MD, John E    Nonspecific abnormal results of liver function study    Personal history of radiation therapy 08/2018   bilateral breasts   Pharyngitis 02/18/2012   Plantar fascial fibromatosis    Preventative health care 01/22/2013   Seasonal allergies 10/31/2015   Sleep apnea    has oral appliance but does not fit well due to missing teeth   Sore throat 04/03/2017   Tubular adenoma of colon    Unspecified sleep apnea    no c-pap    Patient Active Problem List   Diagnosis Date Noted   Diabetes mellitus without complication ([place]) [id number]   Chest wall  asymmetry 12/05/2019   Uncontrolled type 2 diabetes mellitus with hyperglycemia ([place]) 12/05/2019   Insomnia 07/23/2019   Shingles 02/18/2019   Ductal carcinoma in situ (DCIS) of left breast 07/03/2018   Vertigo 03/13/2018   Ductal carcinoma in situ (DCIS) of right breast 03/10/2018   Bilateral calf pain 10/03/2017   Right shoulder pain 10/03/2017   Headache 06/30/2017   Obesity (BMI 30-39.9) 05/02/2017   Seasonal allergies 10/31/2015   Hx of adenomatous colonic polyps 04/30/2014   Cervical cancer screening 01/22/2013   Preventative health care 01/22/2013   Chest pain 01/06/2012   Endometriosis    Chronic cough [id number]   Nonalcoholic fatty liver disease 04/28/2010   OSA (obstructive sleep apnea) 03/26/2009   RT BUNDLE BRANCH BLOCK&LT ANT FASCICULAR BLOCK 12/20/2008   Thoracic back pain 10/10/2008   Osteoarthritis 07/05/2008   Abdominal pain 07/05/2008   Aneurysm, thoracoabdominal ([place]) 03/22/2007   Type 2 diabetes mellitus with hyperglycemia, without Gearldene Fiorenza-term current use of insulin ([place]) 10/28/2006   Hyperlipidemia, mixed 10/28/2006   Depression with anxiety 10/28/2006   Essential hypertension 10/28/2006   ESOPHAGEAL STRICTURE 05/04/2004   HIATAL HERNIA 05/04/2004    Past Surgical History:  Procedure Laterality Date   ABDOMINAL HYSTERECTOMY  1982   partial   APPENDECTOMY     benigh cyst  in lung  2006   right   BREAST BIOPSY Bilateral 04/03/2018   BREAST BIOPSY Bilateral 03/23/2018   BREAST CYST INCISION AND DRAINAGE     right breast   BREAST EXCISIONAL BIOPSY Right 2019   BREAST LUMPECTOMY Bilateral 04/2018   BREAST LUMPECTOMY WITH RADIOACTIVE SEED LOCALIZATION Bilateral 05/02/2018   Procedure: RIGHT BREAST SEED LUMPECTOMY X2 AND LEFT BREAST SEED GUIDED LUMPECTOMY;  Surgeon: [name], MD;  Location: [facility];  Service: General;  Laterality: Bilateral;   CATARACT EXTRACTION     right eye   HERNIA REPAIR     per pt, she is not aware of this  surgery   RE-EXCISION OF BREAST LUMPECTOMY Right 05/25/2018   Procedure: RE-EXCISION OF RIGHT BREAST LUMPECTOMY;  Surgeon: [name], MD;  Location: [facility];  Service: General;  Laterality: Right;   seba     TUBAL LIGATION      Allergies Pravastatin, Amoxicillin, Atorvastatin, and Codeine  Family History  Problem Relation Age of Onset   Breast cancer Mother    Hypertension Mother    Diabetes Mother        type 2   Other Mother        esophagus surgery   Heart disease Father    Hyperlipidemia Father    Hypertension Father    Heart attack Father        MI at age 21   Diabetes Brother        type 2   Heart disease Maternal Grandmother    Heart attack Maternal Grandfather    Hypertension Maternal Grandfather    Stroke Paternal Grandmother    Hypertension Sister    Diabetes Sister    Hypertension Brother    Alcohol abuse Maternal Aunt    Cancer Maternal Aunt    Colon cancer Neg Hx    Stomach cancer Neg Hx    Esophageal cancer Neg Hx     Social History Social History   Tobacco Use   Smoking status: Never   Smokeless tobacco: Never  Substance Use Topics   Alcohol use: No    Alcohol/week: 0.0 standard drinks   Drug use: No    Review of Systems  Constitutional: No fever/chills Eyes: No visual changes. ENT: No sore throat. Cardiovascular: Denies chest pain. Positive palpitations  Respiratory: Mild shortness of breath. Gastrointestinal: No abdominal pain.  No nausea, no vomiting.  No diarrhea.  No constipation. Genitourinary: Negative for dysuria. Musculoskeletal: Negative for back pain. Skin: Negative for rash. Neurological: Negative for headaches, focal weakness or numbness.  10-point ROS otherwise negative.  ____________________________________________   PHYSICAL EXAM:  VITAL SIGNS: ED Triage Vitals [01/15/21 1619]  Enc Vitals Group     BP (!) 135/91     Pulse Rate 86     Resp 20     Temp 98.5 F (36.9 C)     Temp Source  Oral     SpO2 100 %     Weight 190 lb (86.2 kg)     Height '5\' 3"'$  (1.6 m)   Constitutional: Alert and oriented. Well appearing and in no acute distress. Eyes: Conjunctivae are normal.  Head: Atraumatic. Nose: No congestion/rhinnorhea. Mouth/Throat: Mucous membranes are moist.  Neck: No stridor.   Cardiovascular: Normal rate, regular rhythm. Good peripheral circulation. Grossly normal heart sounds.   Respiratory: Normal respiratory effort.  No retractions. Lungs CTAB. Gastrointestinal: Soft and nontender. No distention.  Musculoskeletal: No lower extremity tenderness nor edema. No gross deformities  of extremities. Neurologic:  Normal speech and language. No gross focal neurologic deficits are appreciated.  Skin:  Skin is warm, dry and intact. No rash noted.   ____________________________________________   LABS (all labs ordered are listed, but only abnormal results are displayed)  Labs Reviewed  BASIC METABOLIC PANEL - Abnormal; Notable for the following components:      Result Value   Glucose, Bld 119 (*)    All other components within normal limits  CBC - Abnormal; Notable for the following components:   MCV 79.3 (*)    All other components within normal limits  LIPASE, BLOOD - Abnormal; Notable for the following components:   Lipase 62 (*)    All other components within normal limits  MAGNESIUM - Abnormal; Notable for the following components:   Magnesium 1.6 (*)    All other components within normal limits  RESP PANEL BY RT-PCR (FLU A&B, COVID) [id number]  HEPATIC FUNCTION PANEL  TROPONIN I (HIGH SENSITIVITY)   ____________________________________________  EKG   EKG Interpretation  Date/Time:  Thursday January 15 2021 16:27:21 EDT Ventricular Rate:  83 PR Interval:  174 QRS Duration: 136 QT Interval:  422 QTC Calculation: 495 R Axis:   -84 Text Interpretation: Normal sinus rhythm Right bundle branch block Left anterior fascicular block *Bifascicular block *Abnormal  ECG Confirmed by Nanda Quinton [phone]) on 01/15/2021 4:35:34 PM        ____________________________________________  RADIOLOGY  DG Chest 2 View  Result Date: 01/15/2021 CLINICAL DATA:  Shortness of breath EXAM: CHEST - 2 VIEW COMPARISON:  Chest radiograph 01/02/2020 FINDINGS: The cardiomediastinal silhouette is stable. There is no focal consolidation or pulmonary edema. There is no pleural effusion or pneumothorax. The bones are unremarkable. Surgical clips are again noted over the breasts bilaterally. IMPRESSION: No radiographic evidence of acute cardiopulmonary process. Electronically Signed   By: Valetta Mole MD   On: 01/15/2021 16:57    ____________________________________________   PROCEDURES  Procedure(s) performed:   Procedures  None ____________________________________________   INITIAL IMPRESSION / ASSESSMENT AND PLAN / ED COURSE  Pertinent labs & imaging results that were available during my care of the patient were reviewed by me and considered in my medical decision making (see chart for details).   Patient presents emergency department with heart palpitations, shortness of breath, mild URI symptoms.  No chest pain to suspect ACS or PE.  Patient's chest x-ray does not show infiltrate or other acute process.  Patient's EKG is abnormal but similar to prior tracings.  Interpreted by me as above.  Plan for electrolytes screening labs, COVID swab, reassess.  Labs reviewed. No acute findings. Patient feeling improved after IVF and albuterol. Magnesium is slightly low here. Troponin is negative. EKG interpreted by me as above. Patient appears safe/stable for d/c with plan for close PCP follow up. Discussed strict ED return precautions.  ____________________________________________  FINAL CLINICAL IMPRESSION(S) / ED DIAGNOSES  Final diagnoses:  Palpitations  Hypomagnesemia     MEDICATIONS GIVEN DURING THIS VISIT:  Medications  sodium chloride 0.9 % bolus 500 mL (0 mLs  Intravenous Stopped 01/15/21 1815)     NEW OUTPATIENT MEDICATIONS STARTED DURING THIS VISIT:  Discharge Medication List as of 01/15/2021  6:26 PM     START taking these medications   Details  magnesium 30 MG tablet Take 1 tablet (30 mg total) by mouth daily for 5 days., Starting Thu 01/15/2021, Until Tue 01/20/2021, Print        Note:  This document was prepared  using Systems analyst and may include unintentional dictation errors.  Nanda Quinton, MD, [facility] Emergency Medicine    Cheryl Chay, [facility], MD 01/20/21 [phone]

## 2021-01-15 NOTE — Discharge Instructions (Addendum)
You were seen in the emergency department today with feeling fluttering in your chest.  Your magnesium is slightly low we will replace that over the next several days.  Please follow with your primary care doctor next week for repeat blood work and to see how your symptoms are doing.  If these continue you may need referral to a cardiologist.  Return to the emergency department any new or suddenly worsening symptoms.

## 2021-01-16 ENCOUNTER — Telehealth: Payer: Self-pay

## 2021-01-16 ENCOUNTER — Telehealth: Payer: Self-pay | Admitting: Internal Medicine

## 2021-01-16 NOTE — Telephone Encounter (Signed)
Please advise 

## 2021-01-16 NOTE — Telephone Encounter (Signed)
Pt called in needed a hospital follow up. Just wondering if she could be seen Tuesday at 4pm

## 2021-01-16 NOTE — Telephone Encounter (Signed)
Patient called to advise that she is in the medicare donut hole and her Rybelsus is now $200 plus - I provided her with information about savings card and she is going to contact her pharmacy to see if a savings cad will help with the cost.  If not she wanted to let Dr Kelton Pillar know that she wont be able to get the Rybelsus filled.  Insurance advised her that Glipizide was something that the did cover for her.   Call back # [phone]

## 2021-01-19 NOTE — Telephone Encounter (Signed)
Called and  detailed voicemail  to inform patient regarding pt assists, explain to patient regarding fill out forms  asked to call us back so we can give her more information regarding this

## 2021-01-21 ENCOUNTER — Other Ambulatory Visit (HOSPITAL_BASED_OUTPATIENT_CLINIC_OR_DEPARTMENT_OTHER): Payer: Self-pay

## 2021-01-22 NOTE — Telephone Encounter (Signed)
You have an appt with Estill Bamberg at that time.

## 2021-01-26 NOTE — Telephone Encounter (Signed)
Patient is scheduled for 01/29/21

## 2021-01-29 ENCOUNTER — Other Ambulatory Visit: Payer: Self-pay

## 2021-01-29 ENCOUNTER — Ambulatory Visit (INDEPENDENT_AMBULATORY_CARE_PROVIDER_SITE_OTHER): Payer: HMO | Admitting: Family Medicine

## 2021-01-29 ENCOUNTER — Other Ambulatory Visit (HOSPITAL_BASED_OUTPATIENT_CLINIC_OR_DEPARTMENT_OTHER): Payer: Self-pay

## 2021-01-29 VITALS — BP 128/68 | HR 85 | Temp 98.3°F | Resp 16 | Wt 190.6 lb

## 2021-01-29 DIAGNOSIS — E782 Mixed hyperlipidemia: Secondary | ICD-10-CM | POA: Diagnosis not present

## 2021-01-29 DIAGNOSIS — R748 Abnormal levels of other serum enzymes: Secondary | ICD-10-CM

## 2021-01-29 DIAGNOSIS — E1165 Type 2 diabetes mellitus with hyperglycemia: Secondary | ICD-10-CM

## 2021-01-29 DIAGNOSIS — I10 Essential (primary) hypertension: Secondary | ICD-10-CM

## 2021-01-29 DIAGNOSIS — Z23 Encounter for immunization: Secondary | ICD-10-CM | POA: Diagnosis not present

## 2021-01-29 DIAGNOSIS — R002 Palpitations: Secondary | ICD-10-CM | POA: Diagnosis not present

## 2021-01-29 LAB — COMPREHENSIVE METABOLIC PANEL
ALT: 32 U/L (ref 0–35)
AST: 20 U/L (ref 0–37)
Albumin: 4 g/dL (ref 3.5–5.2)
Alkaline Phosphatase: 68 U/L (ref 39–117)
BUN: 19 mg/dL (ref 6–23)
CO2: 28 mEq/L (ref 19–32)
Calcium: 9.8 mg/dL (ref 8.4–10.5)
Chloride: 105 mEq/L (ref 96–112)
Creatinine, Ser: 1.05 mg/dL (ref 0.40–1.20)
GFR: 54.64 mL/min — ABNORMAL LOW (ref >60.00)
Glucose, Bld: 152 mg/dL — ABNORMAL HIGH (ref 70–99)
Potassium: 4.2 mEq/L (ref 3.5–5.1)
Sodium: 141 mEq/L (ref 135–145)
Total Bilirubin: 0.3 mg/dL (ref 0.2–1.2)
Total Protein: 6.9 g/dL (ref 6.0–8.3)

## 2021-01-29 LAB — MAGNESIUM: Magnesium: 1.7 mg/dL (ref 1.5–2.5)

## 2021-01-29 LAB — TSH: TSH: 1.28 u[IU]/mL (ref 0.35–5.50)

## 2021-01-29 LAB — LIPASE: Lipase: 234 U/L — ABNORMAL HIGH (ref 11.0–59.0)

## 2021-01-29 MED ORDER — ROSUVASTATIN CALCIUM 5 MG PO TABS
5.0000 mg | ORAL_TABLET | Freq: Every day | ORAL | 1 refills | Status: DC
  Filled 2021-01-29: qty 90, 90d supply, fill #0
  Filled 2021-05-19: qty 90, 90d supply, fill #1

## 2021-01-29 NOTE — Patient Instructions (Addendum)
Magnesium Glycinate 200-200 mg at bedtime   Paxlovid is the new COVID medication we can give you if you get COVID so make sure you test if you have symptoms because we have to treat by day 5 of symptoms for it to be effective. If you are positive let us know so we can treat. If a home test is negative and your symptoms are persistent get a PCR test. Can check testing locations at [facility]com If you are positive we will make an appointment with [place] and we will send in Paxlovid if you would like it. Check with your pharmacy before we meet to confirm they have it in stock, if they do not then we can get the prescription at the [place]   Palpitations Palpitations are feelings that your heartbeat is irregular or is faster than normal. It may feel like your heart is fluttering or skipping a beat. Palpitations are usually not a serious problem. They may be caused by many things, including smoking, caffeine, alcohol, stress, and certain medicines or drugs. Most causes of palpitations are not serious. However, some palpitations can be a sign of a serious problem. You may need further tests to rule outserious medical problems. Follow these instructions at home:     Pay attention to any changes in your condition. Take these actions to helpmanage your symptoms: Eating and drinking Avoid foods and drinks that may cause palpitations. These may include: Caffeinated coffee, tea, soft drinks, diet pills, and energy drinks. Chocolate. Alcohol. Lifestyle Take steps to reduce your stress and anxiety. Things that can help you relax include: Yoga. Mind-body activities, such as deep breathing, meditation, or using words and images to create positive thoughts (guided imagery). Physical activity, such as swimming, jogging, or walking. Tell your health care provider if your palpitations increase with activity. If you have chest pain or shortness of breath with activity, do not continue the  activity until you are seen by your health care provider. Biofeedback. This is a method that helps you learn to use your mind to control things in your body, such as your heartbeat. Do not use drugs, including cocaine or ecstasy. Do not use marijuana. Get plenty of rest and sleep. Keep a regular bed time. General instructions Take over-the-counter and prescription medicines only as told by your health care provider. Do not use any products that contain nicotine or tobacco, such as cigarettes and e-cigarettes. If you need help quitting, ask your health care provider. Keep all follow-up visits as told by your health care provider. This is important. These may include visits for further testing if palpitations do not go away or get worse. Contact a health care provider if you: Continue to have a fast or irregular heartbeat after 24 hours. Notice that your palpitations occur more often. Get help right away if you: Have chest pain or shortness of breath. Have a severe headache. Feel dizzy or you faint. Summary Palpitations are feelings that your heartbeat is irregular or is faster than normal. It may feel like your heart is fluttering or skipping a beat. Palpitations may be caused by many things, including smoking, caffeine, alcohol, stress, certain medicines, and drugs. Although most causes of palpitations are not serious, some causes can be a sign of a serious medical problem. Get help right away if you faint or have chest pain, shortness of breath, a severe headache, or dizziness. This information is not intended to replace advice given to you by your health care provider. Make  sure you discuss any questions you have with your healthcare provider. Document Revised: 07/13/2017 Document Reviewed: 07/13/2017 Elsevier Patient Education  2022 [employer].

## 2021-01-29 NOTE — Progress Notes (Signed)
Patient ID: [name], female    DOB: [birth year]  Age: 69 y.o. MRN: [id number]    Subjective:  Subjective  HPI [name] presents for office visit today for follow up on ER hospitalization and palpitations. She reports that she has been dealing with palpitations and slight SOB for 3 weeks, before choosing to go to the ER. She was told to take magnesium supplements due to her hypomagnesemia. She states that she was able to get Magnesium oxide 300 mg supplements. Endorses having a cardiology visit scheduled soon. She states that she has had a colonoscopy procedure before experiences the symptoms of palpitations. Endorses starting to eat broccoli, nuts, and leafy greens. Denies CP/HA/congestion/fevers/GI or GU c/o. Taking meds as prescribed.  Review of Systems  Constitutional:  Negative for chills, fatigue and fever.  HENT:  Negative for congestion, rhinorrhea, sinus pressure, sinus pain, sore throat and trouble swallowing.   Eyes:  Negative for pain.  Respiratory:  Positive for shortness of breath. Negative for cough.   Cardiovascular:  Positive for palpitations. Negative for chest pain and leg swelling.  Gastrointestinal:  Negative for abdominal pain, blood in stool, diarrhea, nausea and vomiting.  Genitourinary:  Negative for decreased urine volume, flank pain, frequency, vaginal bleeding and vaginal discharge.  Musculoskeletal:  Negative for back pain.  Neurological:  Negative for headaches.   History Past Medical History:  Diagnosis Date   Acute bronchitis 10/31/2015   Allergy    seasonal   Arthritis    knee-left   Cancer ([place]) 04/2018   right breast ca   Cataract    right eye   Diabetes mellitus 52   type 2   Diaphragmatic hernia without mention of obstruction or gangrene    Endometriosis    Esophageal reflux    Esophageal stricture    GERD (gastroesophageal reflux disease)    Hyperlipidemia    Hyperplastic colon polyp    Hypertension 52   Nonalcoholic fatty  liver disease 04/28/2010   Qualifier: Diagnosis of  By: Arnoldo Morale MD, John E    Nonspecific abnormal results of liver function study    Personal history of radiation therapy 08/2018   bilateral breasts   Pharyngitis 02/18/2012   Plantar fascial fibromatosis    Preventative health care 01/22/2013   Seasonal allergies 10/31/2015   Sleep apnea    has oral appliance but does not fit well due to missing teeth   Sore throat 04/03/2017   Tubular adenoma of colon    Unspecified sleep apnea    no c-pap    She has a past surgical history that includes Appendectomy; benigh cyst in lung (2006); Abdominal hysterectomy (1982); Tubal ligation; seba; Hernia repair; Breast cyst incision and drainage; Cataract extraction; Breast lumpectomy with radioactive seed localization (Bilateral, 05/02/2018); Re-excision of breast lumpectomy (Right, 05/25/2018); Breast lumpectomy (Bilateral, 04/2018); Breast excisional biopsy (Right, 2019); Breast biopsy (Bilateral, 04/03/2018); and Breast biopsy (Bilateral, 03/23/2018).   Her family history includes Alcohol abuse in her maternal aunt; Breast cancer in her mother; Cancer in her maternal aunt; Diabetes in her brother, mother, and sister; Heart attack in her father and maternal grandfather; Heart disease in her father and maternal grandmother; Hyperlipidemia in her father; Hypertension in her brother, father, maternal grandfather, mother, and sister; Other in her mother; Stroke in her paternal grandmother.She reports that she has never smoked. She has never used smokeless tobacco. She reports that she does not drink alcohol and does not use drugs.  Current Outpatient Medications on File Prior  to Visit  Medication Sig Dispense Refill   albuterol (VENTOLIN HFA) 108 (90 Base) MCG/ACT inhaler Inhale 2 puffs into the lungs every 6 (six) hours as needed for wheezing or shortness of breath. 1 Inhaler 0   ASPIR-LOW 81 MG EC tablet TAKE 1 TABLET BY MOUTH DAILY 30 tablet 3    bisoprolol-hydrochlorothiazide (ZIAC) 5-6.25 MG tablet Take 1 tablet by mouth daily. 90 tablet 1   fluticasone (FLONASE) 50 MCG/ACT nasal spray Place 2 sprays into both nostrils daily. 16 g 6   glipiZIDE (GLUCOTROL) 5 MG tablet Take 1 tablet (5 mg total) by mouth 2 (two) times daily before a meal. 180 tablet 3   glucose blood test strip USE AS INSTRUCTED TWICE A DAY 100 strip 12   metFORMIN (GLUCOPHAGE) 1000 MG tablet Take 1 tablet (1,000 mg total) by mouth 2 (two) times daily with a meal. 180 tablet 3   montelukast (SINGULAIR) 10 MG tablet Take 1 tablet (10 mg total) by mouth at bedtime as needed. 30 tablet 3   omeprazole (PRILOSEC) 40 MG capsule TAKE 1 CAPSULE BY MOUTH TWICE DAILY 180 capsule 1   Semaglutide (RYBELSUS) 7 MG TABS Take 1 tablet by mouth daily. 90 tablet 3   Current Facility-Administered Medications on File Prior to Visit  Medication Dose Route Frequency Provider Last Rate Last Admin   Chlorhexidine Gluconate Cloth 2 % PADS 6 each  6 each Topical Once [name], MD       And   Chlorhexidine Gluconate Cloth 2 % PADS 6 each  6 each Topical Once [name], MD         Objective:  Objective  Physical Exam Constitutional:      General: She is not in acute distress.    Appearance: Normal appearance. She is not ill-appearing or toxic-appearing.  HENT:     Head: Normocephalic and atraumatic.     Right Ear: Tympanic membrane, ear canal and external ear normal.     Left Ear: Tympanic membrane, ear canal and external ear normal.     Nose: No congestion or rhinorrhea.  Eyes:     Extraocular Movements: Extraocular movements intact.     Pupils: Pupils are equal, round, and reactive to light.  Cardiovascular:     Rate and Rhythm: Normal rate and regular rhythm.     Pulses: Normal pulses.     Heart sounds: Normal heart sounds. No murmur heard. Pulmonary:     Effort: Pulmonary effort is normal. No respiratory distress.     Breath sounds: Normal breath sounds. No wheezing,  rhonchi or rales.  Abdominal:     General: Bowel sounds are normal.     Palpations: Abdomen is soft. There is no mass.     Tenderness: There is no abdominal tenderness. There is no guarding.     Hernia: No hernia is present.  Musculoskeletal:        General: Normal range of motion.     Cervical back: Normal range of motion and neck supple.  Skin:    General: Skin is warm and dry.  Neurological:     Mental Status: She is alert and oriented to person, place, and time.  Psychiatric:        Behavior: Behavior normal.   BP 128/68   Pulse 85   Temp 98.3 F (36.8 C)   Resp 16   Wt 190 lb 9.6 oz (86.5 kg)   SpO2 99%   BMI 33.76 kg/m  Wt Readings from Last 3  Encounters:  01/29/21 190 lb 9.6 oz (86.5 kg)  01/15/21 190 lb (86.2 kg)  01/01/21 187 lb (84.8 kg)     Lab Results  Component Value Date   WBC 6.6 01/15/2021   HGB 12.8 01/15/2021   HCT 39.1 01/15/2021   PLT 264 01/15/2021   GLUCOSE 152 (H) 01/29/2021   CHOL 148 11/17/2020   TRIG 176.0 (H) 11/17/2020   HDL 39.70 11/17/2020   LDLDIRECT 96.0 08/19/2020   LDLCALC 73 11/17/2020   ALT 32 01/29/2021   AST 20 01/29/2021   NA 141 01/29/2021   K 4.2 01/29/2021   CL 105 01/29/2021   CREATININE 1.05 01/29/2021   BUN 19 01/29/2021   CO2 28 01/29/2021   TSH 1.28 01/29/2021   HGBA1C 6.9 (A) 12/09/2020   MICROALBUR <0.7 12/09/2020    DG Chest 2 View  Result Date: 01/15/2021 CLINICAL DATA:  Shortness of breath EXAM: CHEST - 2 VIEW COMPARISON:  Chest radiograph 01/02/2020 FINDINGS: The cardiomediastinal silhouette is stable. There is no focal consolidation or pulmonary edema. There is no pleural effusion or pneumothorax. The bones are unremarkable. Surgical clips are again noted over the breasts bilaterally. IMPRESSION: No radiographic evidence of acute cardiopulmonary process. Electronically Signed   By: Valetta Mole MD   On: 01/15/2021 16:57     Assessment & Plan:  Plan    Meds ordered this encounter  Medications    rosuvastatin (CRESTOR) 5 MG tablet    Sig: Take 1 tablet (5 mg total) by mouth daily.    Dispense:  90 tablet    Refill:  1    Problem List Items Addressed This Visit     Type 2 diabetes mellitus with hyperglycemia, without long-term current use of insulin (HCC)    hgba1c acceptable, minimize simple carbs. Increase exercise as tolerated. Continue current meds      Relevant Medications   rosuvastatin (CRESTOR) 5 MG tablet   Hyperlipidemia, mixed    Tolerating statin, encouraged heart healthy diet, avoid trans fats, minimize simple carbs and saturated fats. Increase exercise as tolerated      Relevant Medications   rosuvastatin (CRESTOR) 5 MG tablet   Essential hypertension    Well controlled, no changes to meds. Encouraged heart healthy diet such as the DASH diet and exercise as tolerated.       Relevant Medications   rosuvastatin (CRESTOR) 5 MG tablet   Heart palpitations - Primary    Presented to ER after several weeks of intermittent palpitations. She was found to have a mildly suppressed magnesium and was prescribed magnesium supplements but she never picked them up. She has increased her magnesium in her diet and she has not had any further episodes. Repeat magnesium level today normal. Given her risk factors for cardiac disease she is referred to cardiology for further consideration      Relevant Orders   TSH (Completed)   Magnesium (Completed)   Comprehensive metabolic panel (Completed)   Ambulatory referral to Cardiology   Elevated lipase    Has increased further will proceed with CT abdomen to evaluate. No fatty stool, fevers. Some mild abdominal discomfort occurs occasionally      Relevant Orders   Lipase (Completed)   Need for pneumococcal vaccination   Relevant Orders   Pneumococcal conjugate vaccine 13-valent (Completed)    Follow-up: Return in about 6 months (around 08/01/2021) for annual exam.  I, Suezanne Jacquet, acting as a scribe for [name], MD, have  documented all relevent documentation on behalf  of [name], MD, as directed by [name], MD while in the presence of [name], MD.  I, Mosie Lukes, MD personally performed the services described in this documentation. All medical record entries made by the scribe were at my direction and in my presence. I have reviewed the chart and agree that the record reflects my personal performance and is accurate and complete

## 2021-01-31 ENCOUNTER — Other Ambulatory Visit: Payer: Self-pay | Admitting: Family Medicine

## 2021-01-31 DIAGNOSIS — K859 Acute pancreatitis without necrosis or infection, unspecified: Secondary | ICD-10-CM

## 2021-02-01 DIAGNOSIS — Z23 Encounter for immunization: Secondary | ICD-10-CM | POA: Insufficient documentation

## 2021-02-01 DIAGNOSIS — R748 Abnormal levels of other serum enzymes: Secondary | ICD-10-CM | POA: Insufficient documentation

## 2021-02-01 DIAGNOSIS — R002 Palpitations: Secondary | ICD-10-CM | POA: Insufficient documentation

## 2021-02-01 NOTE — Assessment & Plan Note (Signed)
Well controlled, no changes to meds. Encouraged heart healthy diet such as the DASH diet and exercise as tolerated.  °

## 2021-02-01 NOTE — Assessment & Plan Note (Signed)
Has increased further will proceed with CT abdomen to evaluate. No fatty stool, fevers. Some mild abdominal discomfort occurs occasionally

## 2021-02-01 NOTE — Assessment & Plan Note (Signed)
hgba1c acceptable, minimize simple carbs. Increase exercise as tolerated. Continue current meds 

## 2021-02-01 NOTE — Assessment & Plan Note (Signed)
Tolerating statin, encouraged heart healthy diet, avoid trans fats, minimize simple carbs and saturated fats. Increase exercise as tolerated 

## 2021-02-01 NOTE — Assessment & Plan Note (Signed)
Presented to ER after several weeks of intermittent palpitations. She was found to have a mildly suppressed magnesium and was prescribed magnesium supplements but she never picked them up. She has increased her magnesium in her diet and she has not had any further episodes. Repeat magnesium level today normal. Given her risk factors for cardiac disease she is referred to cardiology for further consideration

## 2021-02-02 NOTE — Telephone Encounter (Signed)
Pt came by the office 1:30pm on 02/02/2021 to pick up paperwork.

## 2021-02-04 ENCOUNTER — Other Ambulatory Visit (INDEPENDENT_AMBULATORY_CARE_PROVIDER_SITE_OTHER): Payer: HMO

## 2021-02-04 ENCOUNTER — Other Ambulatory Visit: Payer: Self-pay

## 2021-02-04 ENCOUNTER — Ambulatory Visit (HOSPITAL_BASED_OUTPATIENT_CLINIC_OR_DEPARTMENT_OTHER)
Admission: RE | Admit: 2021-02-04 | Discharge: 2021-02-04 | Disposition: A | Discharge status: Home / Self Care | Payer: HMO | Source: Ambulatory Visit | Attending: Family Medicine | Admitting: Family Medicine

## 2021-02-04 ENCOUNTER — Encounter (HOSPITAL_BASED_OUTPATIENT_CLINIC_OR_DEPARTMENT_OTHER): Payer: Self-pay

## 2021-02-04 DIAGNOSIS — K859 Acute pancreatitis without necrosis or infection, unspecified: Secondary | ICD-10-CM

## 2021-02-04 DIAGNOSIS — R748 Abnormal levels of other serum enzymes: Secondary | ICD-10-CM | POA: Diagnosis not present

## 2021-02-04 DIAGNOSIS — R109 Unspecified abdominal pain: Secondary | ICD-10-CM

## 2021-02-04 DIAGNOSIS — K760 Fatty (change of) liver, not elsewhere classified: Secondary | ICD-10-CM | POA: Diagnosis not present

## 2021-02-04 LAB — COMPREHENSIVE METABOLIC PANEL
ALT: 34 U/L (ref 0–35)
AST: 23 U/L (ref 0–37)
Albumin: 3.9 g/dL (ref 3.5–5.2)
Alkaline Phosphatase: 70 U/L (ref 39–117)
BUN: 16 mg/dL (ref 6–23)
CO2: 26 mEq/L (ref 19–32)
Calcium: 9.6 mg/dL (ref 8.4–10.5)
Chloride: 103 mEq/L (ref 96–112)
Creatinine, Ser: 0.94 mg/dL (ref 0.40–1.20)
GFR: 62.39 mL/min (ref >60.00)
Glucose, Bld: 152 mg/dL — ABNORMAL HIGH (ref 70–99)
Potassium: 4.1 mEq/L (ref 3.5–5.1)
Sodium: 141 mEq/L (ref 135–145)
Total Bilirubin: 0.4 mg/dL (ref 0.2–1.2)
Total Protein: 6.8 g/dL (ref 6.0–8.3)

## 2021-02-04 LAB — CBC
HCT: 37.4 % (ref 36.0–46.0)
Hemoglobin: 12 g/dL (ref 12.0–15.0)
MCHC: 32.1 g/dL (ref 30.0–36.0)
MCV: 78.8 fl (ref 78.0–100.0)
Platelets: 230 10*3/uL (ref 150.0–400.0)
RBC: 4.74 Mil/uL (ref 3.87–5.11)
RDW: 14.3 % (ref 11.5–15.5)
WBC: 4.3 10*3/uL (ref 4.0–10.5)

## 2021-02-04 LAB — LIPASE: Lipase: 209 U/L — ABNORMAL HIGH (ref 11.0–59.0)

## 2021-02-04 LAB — AMYLASE: Amylase: 107 U/L (ref 27–131)

## 2021-02-04 MED ORDER — IOHEXOL 300 MG/ML  SOLN
75.0000 mL | Freq: Once | INTRAMUSCULAR | Status: AC | PRN
  Administered 2021-02-04: 75 mL via INTRAVENOUS

## 2021-02-06 ENCOUNTER — Other Ambulatory Visit (HOSPITAL_BASED_OUTPATIENT_CLINIC_OR_DEPARTMENT_OTHER): Payer: Self-pay

## 2021-02-06 MED FILL — Glucose Blood Test Strip: 50 days supply | Qty: 100 | Fill #0 | Status: AC

## 2021-02-13 NOTE — Telephone Encounter (Signed)
Patient returned completed Patient Assistance paperwork for [employer].  Income information is included. Put in doctors box up front

## 2021-02-13 NOTE — Telephone Encounter (Signed)
We have gotten the paper work and handle getting this to patient assistance

## 2021-02-18 ENCOUNTER — Telehealth: Payer: Self-pay

## 2021-02-18 NOTE — Telephone Encounter (Signed)
[employer] patient assistance formed faxed.

## 2021-02-19 NOTE — Telephone Encounter (Signed)
We have faxed out her patient assist forms, however pt is requesting samples.  We do have the '7mg'$  is this okay to give to her , please advise.

## 2021-02-19 NOTE — Telephone Encounter (Signed)
Patient called to find out if we have any samples to bridge the gap between now and when hopefully Patient Assistance is approved.  If no samples she wanted to know what she can take to keep her blood sugars under better control.  Call back number cell [phone]

## 2021-02-20 ENCOUNTER — Telehealth: Payer: Self-pay

## 2021-02-20 NOTE — Telephone Encounter (Signed)
Sample Rybelsus  '3mg'$   tablets  Take 2 tabs to equal 6 mg Batch: [id number] Exp: 08/2021

## 2021-02-20 NOTE — Telephone Encounter (Signed)
To pick up medication

## 2021-02-20 NOTE — Telephone Encounter (Signed)
Patient has been notified and will pick up today

## 2021-02-20 NOTE — Telephone Encounter (Signed)
Pt came by the office on 02/20/2021 at 10:57

## 2021-03-02 ENCOUNTER — Other Ambulatory Visit (HOSPITAL_BASED_OUTPATIENT_CLINIC_OR_DEPARTMENT_OTHER): Payer: Self-pay

## 2021-03-03 ENCOUNTER — Other Ambulatory Visit (HOSPITAL_BASED_OUTPATIENT_CLINIC_OR_DEPARTMENT_OTHER): Payer: Self-pay

## 2021-03-04 ENCOUNTER — Other Ambulatory Visit: Payer: Self-pay | Admitting: Adult Health

## 2021-03-04 ENCOUNTER — Ambulatory Visit
Admission: RE | Admit: 2021-03-04 | Discharge: 2021-03-04 | Disposition: A | Discharge status: Home / Self Care | Payer: HMO | Source: Ambulatory Visit | Attending: Adult Health | Admitting: Adult Health

## 2021-03-04 ENCOUNTER — Other Ambulatory Visit: Payer: Self-pay

## 2021-03-04 DIAGNOSIS — Z1231 Encounter for screening mammogram for malignant neoplasm of breast: Secondary | ICD-10-CM

## 2021-03-04 DIAGNOSIS — Z9889 Other specified postprocedural states: Secondary | ICD-10-CM

## 2021-03-04 DIAGNOSIS — Z853 Personal history of malignant neoplasm of breast: Secondary | ICD-10-CM

## 2021-03-18 ENCOUNTER — Encounter: Payer: Self-pay | Admitting: Gastroenterology

## 2021-04-01 ENCOUNTER — Other Ambulatory Visit (HOSPITAL_BASED_OUTPATIENT_CLINIC_OR_DEPARTMENT_OTHER): Payer: Self-pay

## 2021-04-01 ENCOUNTER — Other Ambulatory Visit: Payer: Self-pay | Admitting: Family Medicine

## 2021-04-01 MED FILL — Glucose Blood Test Strip: 50 days supply | Qty: 100 | Fill #1 | Status: AC

## 2021-04-02 ENCOUNTER — Other Ambulatory Visit (HOSPITAL_BASED_OUTPATIENT_CLINIC_OR_DEPARTMENT_OTHER): Payer: Self-pay

## 2021-04-02 MED ORDER — BISOPROLOL-HYDROCHLOROTHIAZIDE 5-6.25 MG PO TABS
1.0000 | ORAL_TABLET | Freq: Every day | ORAL | 1 refills | Status: DC
  Filled 2021-04-02: qty 90, 90d supply, fill #0
  Filled 2021-07-02: qty 90, 90d supply, fill #1

## 2021-04-06 ENCOUNTER — Telehealth (INDEPENDENT_AMBULATORY_CARE_PROVIDER_SITE_OTHER): Payer: HMO | Admitting: Family

## 2021-04-06 ENCOUNTER — Other Ambulatory Visit (HOSPITAL_BASED_OUTPATIENT_CLINIC_OR_DEPARTMENT_OTHER): Payer: Self-pay

## 2021-04-06 ENCOUNTER — Encounter: Payer: Self-pay | Admitting: Family

## 2021-04-06 VITALS — Ht 63.0 in | Wt 190.7 lb

## 2021-04-06 DIAGNOSIS — J069 Acute upper respiratory infection, unspecified: Secondary | ICD-10-CM | POA: Diagnosis not present

## 2021-04-06 MED ORDER — FLUTICASONE PROPIONATE 50 MCG/ACT NA SUSP
2.0000 | Freq: Every day | NASAL | 0 refills | Status: DC
  Filled 2021-04-06: qty 16, 30d supply, fill #0

## 2021-04-06 MED ORDER — ALBUTEROL SULFATE HFA 108 (90 BASE) MCG/ACT IN AERS
2.0000 | INHALATION_SPRAY | Freq: Four times a day (QID) | RESPIRATORY_TRACT | 0 refills | Status: DC | PRN
  Filled 2021-04-06: qty 18, 25d supply, fill #0

## 2021-04-06 NOTE — Progress Notes (Signed)
MyChart Video Visit    Virtual Visit via Video Note   This visit type was conducted due to national recommendations for restrictions regarding the COVID-19 Pandemic (e.g. social distancing) in an effort to limit this patient's exposure and mitigate transmission in our community. This patient is at least at moderate risk for complications without adequate follow up. This format is felt to be most appropriate for this patient at this time. Physical exam was limited by quality of the video and audio technology used for the visit. CMA was able to get the patient set up on a video visit.  Patient location: Home. Patient and provider in visit Provider location: Office  I discussed the limitations of evaluation and management by telemedicine and the availability of in person appointments. The patient expressed understanding and agreed to proceed.  Visit Date: 04/06/2021  Today's healthcare provider: Jeanie Sewer, NP     Subjective:    Patient ID: [name], female    DOB: 08-Dec-1950, 70 y.o.   MRN: [id number]  Chief Complaint  Patient presents with   Cough    All symptoms starting 3 weeks ago. Treated with OTC medications. But symptoms have not subsided. Pt denies fever. At home Covid Test was negative.    Nasal Congestion   Sore Throat   Ear Pain    HPI Upper Respiratory Infection: Patient complains of symptoms of a UR. Symptoms include bilateral ear pain that is improving, and sore throat. Denies HA, sinus pain, or fever.  Onset of symptoms was 3 weeks ago, most of which are better except the cough. She also c/o nasal congestion and productive cough with  clear colored sputum for the past 3 weeks .  She is drinking moderate amounts of fluids. Evaluation to date: none. Treatment to date: OTC antihistamines and cough suppressants.    Past Medical History:  Diagnosis Date   Acute bronchitis 10/31/2015   Allergy    seasonal   Arthritis    knee-left   Cancer ([place])  04/2018   right breast ca   Cataract    right eye   Diabetes mellitus 52   type 2   Diaphragmatic hernia without mention of obstruction or gangrene    Endometriosis    Esophageal reflux    Esophageal stricture    GERD (gastroesophageal reflux disease)    Hyperlipidemia    Hyperplastic colon polyp    Hypertension 52   Nonalcoholic fatty liver disease 04/28/2010   Qualifier: Diagnosis of  By: Arnoldo Morale MD, John E    Nonspecific abnormal results of liver function study    Personal history of radiation therapy 08/2018   bilateral breasts   Pharyngitis 02/18/2012   Plantar fascial fibromatosis    Preventative health care 01/22/2013   Seasonal allergies 10/31/2015   Sleep apnea    has oral appliance but does not fit well due to missing teeth   Sore throat 04/03/2017   Tubular adenoma of colon    Unspecified sleep apnea    no c-pap    Past Surgical History:  Procedure Laterality Date   ABDOMINAL HYSTERECTOMY  1982   partial   APPENDECTOMY     benigh cyst in lung  2006   right   BREAST BIOPSY Bilateral 04/03/2018   BREAST BIOPSY Bilateral 03/23/2018   BREAST CYST INCISION AND DRAINAGE     right breast   BREAST EXCISIONAL BIOPSY Right 2019   BREAST LUMPECTOMY Bilateral 04/2018   BREAST LUMPECTOMY WITH RADIOACTIVE SEED LOCALIZATION  Bilateral 05/02/2018   Procedure: RIGHT BREAST SEED LUMPECTOMY X2 AND LEFT BREAST SEED GUIDED LUMPECTOMY;  Surgeon: [name], MD;  Location: [place];  Service: General;  Laterality: Bilateral;   CATARACT EXTRACTION     right eye   HERNIA REPAIR     per pt, she is not aware of this surgery   RE-EXCISION OF BREAST LUMPECTOMY Right 05/25/2018   Procedure: RE-EXCISION OF RIGHT BREAST LUMPECTOMY;  Surgeon: [name], MD;  Location: [place];  Service: General;  Laterality: Right;   seba     TUBAL LIGATION      Outpatient Medications Prior to Visit  Medication Sig Dispense Refill   ASPIR-LOW 81 MG EC tablet  TAKE 1 TABLET BY MOUTH DAILY 30 tablet 3   bisoprolol-hydrochlorothiazide (ZIAC) 5-6.25 MG tablet Take 1 tablet by mouth daily. 90 tablet 1   glipiZIDE (GLUCOTROL) 5 MG tablet Take 1 tablet (5 mg total) by mouth 2 (two) times daily before a meal. 180 tablet 3   glucose blood test strip USE AS INSTRUCTED TWICE A DAY 100 strip 12   metFORMIN (GLUCOPHAGE) 1000 MG tablet Take 1 tablet (1,000 mg total) by mouth 2 (two) times daily with a meal. 180 tablet 3   montelukast (SINGULAIR) 10 MG tablet Take 1 tablet (10 mg total) by mouth at bedtime as needed. 30 tablet 3   omeprazole (PRILOSEC) 40 MG capsule TAKE 1 CAPSULE BY MOUTH TWICE DAILY 180 capsule 1   rosuvastatin (CRESTOR) 5 MG tablet Take 1 tablet (5 mg total) by mouth daily. 90 tablet 1   albuterol (VENTOLIN HFA) 108 (90 Base) MCG/ACT inhaler Inhale 2 puffs into the lungs every 6 (six) hours as needed for wheezing or shortness of breath. 1 Inhaler 0   fluticasone (FLONASE) 50 MCG/ACT nasal spray Place 2 sprays into both nostrils daily. 16 g 6   Semaglutide (RYBELSUS) 7 MG TABS Take 1 tablet by mouth daily. (Patient not taking: Reported on 04/06/2021) 90 tablet 3   Facility-Administered Medications Prior to Visit  Medication Dose Route Frequency Provider Last Rate Last Admin   Chlorhexidine Gluconate Cloth 2 % PADS 6 each  6 each Topical Once [name], MD       And   Chlorhexidine Gluconate Cloth 2 % PADS 6 each  6 each Topical Once [name], MD        Allergies  Allergen Reactions   Pravastatin Other (See Comments)    Myalgia, fatigue, memory loss   Amoxicillin Itching    REACTION: rash   Atorvastatin Other (See Comments)    Myalgia, fatigue, memory loss   Codeine Nausea And Vomiting        Objective:    Physical Exam Vitals and nursing note reviewed.  Constitutional:      General: She is not in acute distress.    Appearance: Normal appearance.  HENT:     Head: Normocephalic.  Pulmonary:     Effort: No respiratory  distress.  Musculoskeletal:     Cervical back: Normal range of motion.  Skin:    General: Skin is dry.     Coloration: Skin is not pale.  Neurological:     Mental Status: She is alert and oriented to person, place, and time.  Psychiatric:        Mood and Affect: Mood normal.    Ht 5\' 3"  (1.6 m)   Wt 190 lb 11.2 oz (86.5 kg)   BMI 33.78 kg/m   Wt  Readings from Last 3 Encounters:  04/06/21 190 lb 11.2 oz (86.5 kg)  01/29/21 190 lb 9.6 oz (86.5 kg)  01/15/21 190 lb (86.2 kg)       Assessment & Plan:   Problem List Items Addressed This Visit       Respiratory   Viral upper respiratory tract infection - Primary    Sx for 3 weeks, most better except nagging cough, suspect sore throat from postnasal drip, pt not using Flonase, advised to restart for 7-10d, also refilling Albuterol inhaler as pt has hx of Asthma and could be having overlap of allergies with viral syndrome. Refills of above meds sent.      Relevant Medications   fluticasone (FLONASE) 50 MCG/ACT nasal spray   albuterol (VENTOLIN HFA) 108 (90 Base) MCG/ACT inhaler     I discussed the assessment and treatment plan with the patient. The patient was provided an opportunity to ask questions and all were answered. The patient agreed with the plan and demonstrated an understanding of the instructions.   The patient was advised to call back or seek an in-person evaluation if the symptoms worsen or if the condition fails to improve as anticipated.  I provided 22 minutes of face-to-face time during this encounter.   Jeanie Sewer, NP [place] [phone] (phone) [phone] (fax)  [place]

## 2021-04-06 NOTE — Assessment & Plan Note (Signed)
Sx for 3 weeks, most better except nagging cough, suspect sore throat from postnasal drip, pt not using Flonase, advised to restart for 7-10d, also refilling Albuterol inhaler as pt has hx of Asthma and could be having overlap of allergies with viral syndrome. Refills of above meds sent.

## 2021-04-07 ENCOUNTER — Other Ambulatory Visit: Payer: Self-pay

## 2021-04-07 ENCOUNTER — Telehealth (INDEPENDENT_AMBULATORY_CARE_PROVIDER_SITE_OTHER): Payer: HMO | Admitting: Internal Medicine

## 2021-04-07 ENCOUNTER — Encounter: Payer: Self-pay | Admitting: Internal Medicine

## 2021-04-07 ENCOUNTER — Ambulatory Visit: Payer: HMO | Admitting: Family Medicine

## 2021-04-07 ENCOUNTER — Other Ambulatory Visit (HOSPITAL_BASED_OUTPATIENT_CLINIC_OR_DEPARTMENT_OTHER): Payer: Self-pay

## 2021-04-07 VITALS — BP 148/80 | HR 75 | Ht 63.0 in

## 2021-04-07 DIAGNOSIS — E1165 Type 2 diabetes mellitus with hyperglycemia: Secondary | ICD-10-CM

## 2021-04-07 MED ORDER — GLIPIZIDE 5 MG PO TABS
ORAL_TABLET | ORAL | 3 refills | Status: DC
  Filled 2021-04-07 – 2021-05-19 (×2): qty 270, 90d supply, fill #0

## 2021-04-07 NOTE — Progress Notes (Signed)
Virtual Visit via Video Note  I connected with [name] on 04/07/21  at 10:30 AM by a video enabled telemedicine application and verified that I am speaking with the correct person using two identifiers.   I discussed the limitations of evaluation and management by telemedicine and the availability of in person appointments. The patient expressed understanding and agreed to proceed.  -Location of the patient : -Location of the provider : office -The names of all persons participating in the telemedicine service : Pt and myself       Name: [name]  Age/ Sex: 70 y.o., female   MRN/ DOB: [id number], August 25, 1950     PCP: Christine Lukes, Benson   Reason for Endocrinology Evaluation: Type 2 Diabetes Mellitus  Initial Endocrine Consultative Visit: 02/22/2017    PATIENT IDENTIFIER: [name] is a 70 y.o. female with a past medical history of T2DM, HTN , Dyslipidemia, Hx of breast Ca ( S/P right lumpectomy) . The patient has followed with Endocrinology clinic since 02/16/2019 for consultative assistance with management of her diabetes.  DIABETIC HISTORY:  [name] was diagnosed with T2DM in 2001. She has been on metformin, Glimepiride and Januvia. Invokana added in 2018 but was cost prohibitive. Her hemoglobin A1c has ranged from 7.0% in 2016, peaking at 8.8% in  2021.    She used to see Dr.Gherghe but was lost to follow up by 2019 , until her return to our clinic 08/2019.    Started Rybelsus 08/2020 but this had to be stopped by the end of 2022 due to inability to qualify for pt assistance   SUBJECTIVE:   During the last visit (11/29/2020):  A1c 6.9 %. Increased Rybelsus,  and continued Metformin , decreased Glipizide      Today (04/07/2021): Ms. [name] is here for a follow up on diabetes management.  She checks her blood sugars 2 times daily The patient has had hypoglycemic episodes since the last clinic visit.   She currently has URI symptoms  , had a virtual visit with her PCP yesterday , she was prescribed Flonase and Albuterol inhaler , she is feeling better   Denies diarrhea or nausea or vomiting    HOME DIABETES REGIMEN:  Metformin 1000 mg, BID Glipizide 5 mg , 1 tablets BID Rybelsus 7 mg daily - not taking      Statin:  Crestor ACE-I/ARB: No   METER DOWNLOAD SUMMARY:  172- 286 mg/dL    DIABETIC COMPLICATIONS: Microvascular complications:   Denies: CKD, retinopathy , neuropathy Last Eye Exam: Completed 01/09/2021  Macrovascular complications:   Denies: CAD, CVA, PVD   HISTORY:  Past Medical History:  Past Medical History:  Diagnosis Date   Acute bronchitis 10/31/2015   Allergy    seasonal   Arthritis    knee-left   Cancer ([facility]) 04/2018   right breast ca   Cataract    right eye   Diabetes mellitus 52   type 2   Diaphragmatic hernia without mention of obstruction or gangrene    Endometriosis    Esophageal reflux    Esophageal stricture    GERD (gastroesophageal reflux disease)    Hyperlipidemia    Hyperplastic colon polyp    Hypertension 52   Nonalcoholic fatty liver disease 04/28/2010   Qualifier: Diagnosis of  By: [name], Christine E    Nonspecific abnormal results of liver function study    Personal history of radiation therapy 08/2018  bilateral breasts   Pharyngitis 02/18/2012   Plantar fascial fibromatosis    Preventative health care 01/22/2013   Seasonal allergies 10/31/2015   Sleep apnea    has oral appliance but does not fit well due to missing teeth   Sore throat 04/03/2017   Tubular adenoma of colon    Unspecified sleep apnea    no c-pap   Past Surgical History:  Past Surgical History:  Procedure Laterality Date   ABDOMINAL HYSTERECTOMY  1982   partial   APPENDECTOMY     benigh cyst in lung  2006   right   BREAST BIOPSY Bilateral 04/03/2018   BREAST BIOPSY Bilateral 03/23/2018   BREAST CYST INCISION AND DRAINAGE     right breast   BREAST EXCISIONAL BIOPSY Right 2019    BREAST LUMPECTOMY Bilateral 04/2018   BREAST LUMPECTOMY WITH RADIOACTIVE SEED LOCALIZATION Bilateral 05/02/2018   Procedure: RIGHT BREAST SEED LUMPECTOMY X2 AND LEFT BREAST SEED GUIDED LUMPECTOMY;  Surgeon: [name], Benson;  Location: [facility];  Service: General;  Laterality: Bilateral;   CATARACT EXTRACTION     right eye   HERNIA REPAIR     per pt, she is not aware of this surgery   RE-EXCISION OF BREAST LUMPECTOMY Right 05/25/2018   Procedure: RE-EXCISION OF RIGHT BREAST LUMPECTOMY;  Surgeon: [name], Benson;  Location: [facility];  Service: General;  Laterality: Right;   seba     TUBAL LIGATION     Social History:  reports that she has never smoked. She has never used smokeless tobacco. She reports that she does not drink alcohol and does not use drugs. Family History:  Family History  Problem Relation Age of Onset   Breast cancer Mother    Hypertension Mother    Diabetes Mother        type 2   Other Mother        esophagus surgery   Heart disease Father    Hyperlipidemia Father    Hypertension Father    Heart attack Father        MI at age 19   Diabetes Brother        type 2   Heart disease Maternal Grandmother    Heart attack Maternal Grandfather    Hypertension Maternal Grandfather    Stroke Paternal Grandmother    Hypertension Sister    Diabetes Sister    Hypertension Brother    Alcohol abuse Maternal Aunt    Cancer Maternal Aunt    Colon cancer Neg Hx    Stomach cancer Neg Hx    Esophageal cancer Neg Hx      HOME MEDICATIONS: Allergies as of 04/07/2021       Reactions   Pravastatin Other (See Comments)   Myalgia, fatigue, memory loss   Amoxicillin Itching   REACTION: rash   Atorvastatin Other (See Comments)   Myalgia, fatigue, memory loss   Codeine Nausea And Vomiting        Medication List        Accurate as of April 07, 2021 10:25 AM. If you have any questions, ask your nurse or doctor.           albuterol 108 (90 Base) MCG/ACT inhaler Commonly known as: VENTOLIN HFA Inhale 2 puffs by mouth into the lungs every 6 (six) hours as needed for wheezing or shortness of breath.   Aspir-Low 81 MG EC tablet Generic drug: aspirin TAKE 1 TABLET BY MOUTH DAILY   bisoprolol-hydrochlorothiazide  5-6.25 MG tablet Commonly known as: ZIAC Take 1 tablet by mouth daily.   fluticasone 50 MCG/ACT nasal spray Commonly known as: FLONASE Place 2 sprays into both nostrils daily.   glipiZIDE 5 MG tablet Commonly known as: GLUCOTROL Take 1 tablet (5 mg total) by mouth 2 (two) times daily before a meal.   metFORMIN 1000 MG tablet Commonly known as: GLUCOPHAGE Take 1 tablet (1,000 mg total) by mouth 2 (two) times daily with a meal.   montelukast 10 MG tablet Commonly known as: SINGULAIR Take 1 tablet (10 mg total) by mouth at bedtime as needed.   omeprazole 40 MG capsule Commonly known as: PRILOSEC TAKE 1 CAPSULE BY MOUTH TWICE DAILY   OneTouch Verio test strip Generic drug: glucose blood USE AS INSTRUCTED TWICE A DAY   rosuvastatin 5 MG tablet Commonly known as: CRESTOR Take 1 tablet (5 mg total) by mouth daily.   Rybelsus 7 MG Tabs Generic drug: Semaglutide Take 1 tablet by mouth daily.         OBJECTIVE:   Exam: General: Pt appears well and is in NAD     DM foot exam 12/09/2020  The skin of the feet is intact without sores or ulcerations. The pedal pulses are 2+ on right and 2+ on left. The sensation is intact to a screening 5.07, 10 gram monofilament bilaterally  DATA REVIEWED:  Lab Results  Component Value Date   HGBA1C 6.9 (A) 12/09/2020   HGBA1C 8.3 (A) 08/19/2020   HGBA1C 8.3 (A) 04/08/2020   Lab Results  Component Value Date   MICROALBUR <0.7 12/09/2020   LDLCALC 73 11/17/2020   CREATININE 0.94 02/04/2021   Lab Results  Component Value Date   MICRALBCREAT 3.8 12/09/2020     Lab Results  Component Value Date   CHOL 148 11/17/2020   HDL 39.70  11/17/2020   LDLCALC 73 11/17/2020   LDLDIRECT 96.0 08/19/2020   TRIG 176.0 (H) 11/17/2020   CHOLHDL 4 11/17/2020        Results for HENRY, DEMERITT (MRN [id number]) as of 12/10/2020 13:50  Ref. Range 12/09/2020 10:54  Creatinine,U Latest Units: mg/dL 18.6  Microalb, Ur Latest Ref Range: 0.0 - 1.9 mg/dL <0.7  MICROALB/CREAT RATIO Latest Ref Range: 0.0 - 30.0 mg/g 3.8   ASSESSMENT / PLAN / RECOMMENDATIONS:   1) Type 2 Diabetes Mellitus,Optimally  Controlled, Without complications - Most recent A1c of 6.9 %. Goal A1c < 7.0 %.     - Unfortunately she has not been able to qualify for pt assistance for Rybelsus through [employer]  - Pt noted with hyperglycemia, will increase Glipizide as below     MEDICATIONS:  - Continue Metformin 1000 mg , 1 tablet with breakfast and dinner - Increase  Glipizide 5 mg, 2 tablet before breakfast and 1 tablet before dinner  - Stop Rybelsus    EDUCATION / INSTRUCTIONS: BG monitoring instructions: Patient is instructed to check her blood sugars 2 times a day, fasting and supper time. Call [facility] if: BG persistently < 70  I reviewed the Rule of 15 for the treatment of hypoglycemia in detail with the patient. Literature supplied.    2) Diabetic complications:  Eye: Does not have known diabetic retinopathy.  Neuro/ Feet: Does not have known diabetic peripheral neuropathy .  Renal: Patient does not have known baseline CKD. She   is not on an ACEI/ARB at present.  MA/CR.normal   3) Dyslipidemia: Patient is on crestor 5 mg, taking half  a tablet , this is all she could tolerate due to arthralgia. She is already on vitamin D. LDL at 85 mg/dL.     F/U in 4 months   Signed electronically by: Mack Guise, Benson  [facility]  [facility] [address] [place], [place] [address] Phone: [phone] FAX: [phone]   CC: [name], [address] [place] [address] Phone: [phone]  Fax: [phone]  Return to Endocrinology clinic as below: Future Appointments  Date Time Provider [place]  04/07/2021 10:30 AM Amare Kontos, Melanie Crazier, Benson [facility]  04/14/2021  2:40 PM Sueanne Margarita, Benson [facility]  07/30/2021 10:00 AM Christine Lukes, Benson [facility]

## 2021-04-13 DIAGNOSIS — N951 Menopausal and female climacteric states: Secondary | ICD-10-CM | POA: Diagnosis not present

## 2021-04-13 DIAGNOSIS — D649 Anemia, unspecified: Secondary | ICD-10-CM | POA: Diagnosis not present

## 2021-04-13 DIAGNOSIS — D0510 Intraductal carcinoma in situ of unspecified breast: Secondary | ICD-10-CM | POA: Diagnosis not present

## 2021-04-13 DIAGNOSIS — Z01419 Encounter for gynecological examination (general) (routine) without abnormal findings: Secondary | ICD-10-CM | POA: Diagnosis not present

## 2021-04-13 DIAGNOSIS — Z6833 Body mass index (BMI) 33.0-33.9, adult: Secondary | ICD-10-CM | POA: Diagnosis not present

## 2021-04-13 DIAGNOSIS — N898 Other specified noninflammatory disorders of vagina: Secondary | ICD-10-CM | POA: Diagnosis not present

## 2021-04-13 DIAGNOSIS — N952 Postmenopausal atrophic vaginitis: Secondary | ICD-10-CM | POA: Diagnosis not present

## 2021-04-14 ENCOUNTER — Other Ambulatory Visit: Payer: Self-pay

## 2021-04-14 ENCOUNTER — Ambulatory Visit (INDEPENDENT_AMBULATORY_CARE_PROVIDER_SITE_OTHER): Payer: HMO | Admitting: Cardiology

## 2021-04-14 ENCOUNTER — Encounter: Payer: Self-pay | Admitting: Cardiology

## 2021-04-14 VITALS — BP 114/68 | HR 80 | Ht 63.0 in | Wt 188.0 lb

## 2021-04-14 DIAGNOSIS — I10 Essential (primary) hypertension: Secondary | ICD-10-CM | POA: Diagnosis not present

## 2021-04-14 DIAGNOSIS — R002 Palpitations: Secondary | ICD-10-CM

## 2021-04-14 NOTE — Patient Instructions (Signed)
Dr. Radford Pax recommends that you talk with your primary care provider to switch your albuterol to Xopenex  Medication Instructions:  Your physician recommends that you continue on your current medications as directed. Please refer to the Current Medication list given to you today.  *If you need a refill on your cardiac medications before your next appointment, please call your pharmacy*   Testing/Procedures: Your physician has recommended that you wear an event monitor. Event monitors are medical devices that record the heart's electrical activity. Doctors most often [place] these monitors to diagnose arrhythmias. Arrhythmias are problems with the speed or rhythm of the heartbeat. The monitor is a small, portable device. You can wear one while you do your normal daily activities. This is usually used to diagnose what is causing palpitations/syncope (passing out).   Follow-Up: At [facility], you and your health needs are our priority.  As part of our continuing mission to provide you with exceptional heart care, we have created designated Provider Care Teams.  These Care Teams include your primary Cardiologist (physician) and Advanced Practice Providers (APPs -  Physician Assistants and Nurse Practitioners) who all work together to provide you with the care you need, when you need it.  Follow up with Dr. Radford Pax as needed based on results of testing.   [employer] Cardiac Event Monitor Instructions Your physician has requested you wear your cardiac event monitor for _30_ days. [employer] may call or text to confirm a shipping address. The monitor will be sent to a land address via UPS. [employer] will not ship a monitor to a PO BOX. It typically takes 3-5 days to receive your monitor after it has been enrolled. [employer] will assist with USPS tracking if your package is delayed. The telephone number for [employer] is [phone]. Once you have received your monitor, please review the enclosed  instructions. Instruction tutorials can also be viewed under help and settings on the enclosed cell phone. Your monitor has already been registered assigning a specific monitor serial # to you.  Applying the monitor Remove cell phone from case and turn it on. The cell phone works as Dealer and needs to be within [employer] of you at all times. The cell phone will need to be charged on a daily basis. We recommend you plug the cell phone into the enclosed charger at your bedside table every night.  Monitor batteries: You will receive two monitor batteries labelled #1 and #2. These are your recorders. Plug battery #2 onto the second connection on the enclosed charger. Keep one battery on the charger at all times. This will keep the monitor battery deactivated. It will also keep it fully charged for when you need to switch your monitor batteries. A small light will be blinking on the battery emblem when it is charging. The light on the battery emblem will remain on when the battery is fully charged.  Open package of a Monitor strip. Insert battery #1 into black hood on strip and gently squeeze monitor battery onto connection as indicated in instruction booklet. Set aside while preparing skin.  Choose location for your strip, vertical or horizontal, as indicated in the instruction booklet. Shave to remove all hair from location. There cannot be any lotions, oils, powders, or colognes on skin where monitor is to be applied. Wipe skin clean with enclosed Saline wipe. Dry skin completely.  Peel paper labeled #1 off the back of the Monitor strip exposing the adhesive. Place the monitor on the chest in the vertical  or horizontal position shown in the instruction booklet. One arrow on the monitor strip must be pointing upward. Carefully remove paper labeled #2, attaching remainder of strip to your skin. Try not to create any folds or wrinkles in the strip as you apply it.  Firmly press and  release the circle in the center of the monitor battery. You will hear a small beep. This is turning the monitor battery on. The heart emblem on the monitor battery will light up every 5 seconds if the monitor battery in turned on and connected to the patient securely. Do not push and hold the circle down as this turns the monitor battery off. The cell phone will locate the monitor battery. A screen will appear on the cell phone checking the connection of your monitor strip. This may read poor connection initially but change to good connection within the next minute. Once your monitor accepts the connection you will hear a series of 3 beeps followed by a climbing crescendo of beeps. A screen will appear on the cell phone showing the two monitor strip placement options. Touch the picture that demonstrates where you applied the monitor strip.  Your monitor strip and battery are waterproof. You are able to shower, bathe, or swim with the monitor on. They just ask you do not submerge deeper than 3 feet underwater. We recommend removing the monitor if you are swimming in a lake, river, or ocean.  Your monitor battery will need to be switched to a fully charged monitor battery approximately once a week. The cell phone will alert you of an action which needs to be made.  On the cell phone, tap for details to reveal connection status, monitor battery status, and cell phone battery status. The green dots indicates your monitor is in good status. A red dot indicates there is something that needs your attention.  To record a symptom, click the circle on the monitor battery. In 30-60 seconds a list of symptoms will appear on the cell phone. Select your symptom and tap save. Your monitor will record a sustained or significant arrhythmia regardless of you clicking the button. Some patients do not feel the heart rhythm irregularities. [employer] will notify us of any serious or critical events.  Refer to  instruction booklet for instructions on switching batteries, changing strips, the Do not disturb or Pause features, or any additional questions.  Call [employer] at [phone], to confirm your monitor is transmitting and record your baseline. They will answer any questions you may have regarding the monitor instructions at that time.  Returning the monitor to [place] all equipment back into blue box. Peel off strip of paper to expose adhesive and close box securely. There is a prepaid UPS shipping label on this box. Drop in a UPS drop box, or at a UPS facility like [employer]. You may also contact [employer] to arrange UPS to pick up monitor package at your home.

## 2021-04-14 NOTE — Progress Notes (Signed)
Cardiology CONSULT Note    Date:  04/14/2021   ID:  LOUKISHA GUNNERSON, DOB 12/06/1950, MRN [id number]  PCP:  Mosie Lukes, MD  Cardiologist:  Fransico Him, MD   Chief Complaint  Patient presents with   New Patient (Initial Visit)    palpitations    History of Present Illness:  [name] is a 70 y.o. female who is being seen today for the evaluation of palpitations at the request of Mosie Lukes, MD.  This is a 70yo AAF with a hx of arthritis, allergies, breast CA, DM2, GERD, HLD, HTN who is referred for evaluation of palpitations. Her palpitations have been occurring for a while.   She was found to have low Magnesium at 1.6 and started on supplements.  The palpitations went away and she stopped the Magnesium and the palpitations cam back so she restarted it again.  She feels them in her epigastric and throat area.  They come and go.  She denies any presyncope or syncope.  She notices them throughout the day.  She denies any chest pain or pressure, SOB, DOE, LE edema.  She drinks 1 cup of coffee a week and occasional tea on Sundays and a little bit of soda occasionally.  She also has a hx of asthma and just started on Albuterol.   Past Medical History:  Diagnosis Date   Acute bronchitis 10/31/2015   Allergy    seasonal   Arthritis    knee-left   Cancer ([place]) 04/2018   right breast ca   Cataract    right eye   Diabetes mellitus 52   type 2   Diaphragmatic hernia without mention of obstruction or gangrene    Endometriosis    Esophageal reflux    Esophageal stricture    GERD (gastroesophageal reflux disease)    Hyperlipidemia    Hyperplastic colon polyp    Hypertension 52   Nonalcoholic fatty liver disease 04/28/2010   Qualifier: Diagnosis of  By: Arnoldo Morale MD, John E    Nonspecific abnormal results of liver function study    Personal history of radiation therapy 08/2018   bilateral breasts   Pharyngitis 02/18/2012   Plantar fascial fibromatosis    Preventative  health care 01/22/2013   Seasonal allergies 10/31/2015   Sleep apnea    has oral appliance but does not fit well due to missing teeth   Sore throat 04/03/2017   Tubular adenoma of colon    Unspecified sleep apnea    no c-pap    Past Surgical History:  Procedure Laterality Date   ABDOMINAL HYSTERECTOMY  1982   partial   APPENDECTOMY     benigh cyst in lung  2006   right   BREAST BIOPSY Bilateral 04/03/2018   BREAST BIOPSY Bilateral 03/23/2018   BREAST CYST INCISION AND DRAINAGE     right breast   BREAST EXCISIONAL BIOPSY Right 2019   BREAST LUMPECTOMY Bilateral 04/2018   BREAST LUMPECTOMY WITH RADIOACTIVE SEED LOCALIZATION Bilateral 05/02/2018   Procedure: RIGHT BREAST SEED LUMPECTOMY X2 AND LEFT BREAST SEED GUIDED LUMPECTOMY;  Surgeon: [name], MD;  Location: [facility];  Service: General;  Laterality: Bilateral;   CATARACT EXTRACTION     right eye   HERNIA REPAIR     per pt, she is not aware of this surgery   RE-EXCISION OF BREAST LUMPECTOMY Right 05/25/2018   Procedure: RE-EXCISION OF RIGHT BREAST LUMPECTOMY;  Surgeon: [name], MD;  Location:  [facility];  Service: General;  Laterality: Right;   seba     TUBAL LIGATION      Current Medications: Current Meds  Medication Sig   albuterol (VENTOLIN HFA) 108 (90 Base) MCG/ACT inhaler Inhale 2 puffs by mouth into the lungs every 6 (six) hours as needed for wheezing or shortness of breath.   ASPIR-LOW 81 MG EC tablet TAKE 1 TABLET BY MOUTH DAILY   bisoprolol-hydrochlorothiazide (ZIAC) 5-6.25 MG tablet Take 1 tablet by mouth daily.   fluticasone (FLONASE) 50 MCG/ACT nasal spray Place 2 sprays into both nostrils daily.   glipiZIDE (GLUCOTROL) 5 MG tablet Take 2 tablets (10 mg total) by mouth daily before breakfast AND 1 tablet (5 mg total) daily before supper.   glucose blood test strip USE AS INSTRUCTED TWICE A DAY   glucose blood test strip OneTouch Verio test strips   metFORMIN  (GLUCOPHAGE) 1000 MG tablet Take 1 tablet (1,000 mg total) by mouth 2 (two) times daily with a meal.   montelukast (SINGULAIR) 10 MG tablet Take 1 tablet (10 mg total) by mouth at bedtime as needed.   omeprazole (PRILOSEC) 40 MG capsule TAKE 1 CAPSULE BY MOUTH TWICE DAILY   rosuvastatin (CRESTOR) 5 MG tablet Take 1 tablet (5 mg total) by mouth daily.    Allergies:   Pravastatin, Amoxicillin, Atorvastatin, and Codeine   Social History   Socioeconomic History   Marital status: Married    Spouse name: Timothy   Number of children: 2   Years of education: Not on file   Highest education level: Not on file  Occupational History   Occupation: Building surveyor: [employer]  Tobacco Use   Smoking status: Never   Smokeless tobacco: Never  Substance and Sexual Activity   Alcohol use: No    Alcohol/week: 0.0 standard drinks   Drug use: No   Sexual activity: Yes    Birth control/protection: Post-menopausal    Comment: lives with husband, no dietary restrictions just watching carbs, wears seat belt  Other Topics Concern   Not on file  Social History Narrative   Not on file   Social Determinants of Health   Financial Resource Strain: Not on file  Food Insecurity: Not on file  Transportation Needs: Not on file  Physical Activity: Not on file  Stress: Not on file  Social Connections: Not on file     Family History:  The patient's family history includes Alcohol abuse in her maternal aunt; Breast cancer in her mother; Cancer in her maternal aunt; Diabetes in her brother, mother, and sister; Heart attack in her father and maternal grandfather; Heart disease in her father and maternal grandmother; Hyperlipidemia in her father; Hypertension in her brother, father, maternal grandfather, mother, and sister; Other in her mother; Stroke in her paternal grandmother.   ROS:   Please see the history of present illness.    ROS All other systems reviewed and are negative.  No flowsheet  data found.     PHYSICAL EXAM:   VS:  BP 114/68   Pulse 80   Ht 5\' 3"  (1.6 m)   Wt 188 lb (85.3 kg)   SpO2 99%   BMI 33.30 kg/m    GEN: Well nourished, well developed, in no acute distress  HEENT: normal  Neck: no JVD, carotid bruits, or masses Cardiac: RRR; no murmurs, rubs, or gallops,no edema.  Intact distal pulses bilaterally.  Respiratory:  clear to auscultation bilaterally, normal work of breathing GI: soft,  nontender, nondistended, + BS MS: no deformity or atrophy  Skin: warm and dry, no rash Neuro:  Alert and Oriented x 3, Strength and sensation are intact Psych: euthymic mood, full affect  Wt Readings from Last 3 Encounters:  04/14/21 188 lb (85.3 kg)  04/06/21 190 lb 11.2 oz (86.5 kg)  01/29/21 190 lb 9.6 oz (86.5 kg)      Studies/Labs Reviewed:   EKG:  EKG is not ordered today.    Recent Labs: 01/29/2021: Magnesium 1.7; TSH 1.28 02/04/2021: ALT 34; BUN 16; Creatinine, Ser 0.94; Hemoglobin 12.0; Platelets 230.0; Potassium 4.1; Sodium 141   Additional studies/ records that were reviewed today include:  OV notes from PCP   ASSESSMENT:    1. Palpitations   2. Essential hypertension      PLAN:  In order of problems listed above:  Palpitations -these have occurred for a while and are sporadic -she does not drink much caffeine -her magnesium was low and was supplemented and the palpitations got better but when she stopped the suppl the palpitations came back so she is back on Mag -TSH was normal in Aug and K+ was 4.1. -Encouraged her to avoid ETOH and caffeine -will get a 30 day event monitor to rule out PAF -she was just prescribed albuterol for asthma and I asked her to let her MD know to change it to Xopenex due to palpitations  2.  HTN -BP is controlled on exam today. -No new prescription drug management with bisoprolol HCT 5-6.25 mg daily  Time Spent: 20 minutes total time of encounter, including 15 minutes spent in face-to-face patient care  on the date of this encounter. This time includes coordination of care and counseling regarding above mentioned problem list. Remainder of non-face-to-face time involved reviewing chart documents/testing relevant to the patient encounter and documentation in the medical record. I have independently reviewed documentation from referring provider  Medication Adjustments/Labs and Tests Ordered: Current medicines are reviewed at length with the patient today.  Concerns regarding medicines are outlined above.  Medication changes, Labs and Tests ordered today are listed in the Patient Instructions below.  There are no Patient Instructions on file for this visit.   Signed, Fransico Him, MD  04/14/2021 3:02 PM    [facility] [address], [place], [place]  [address] Phone: [phone]; Fax: [phone]

## 2021-04-14 NOTE — Addendum Note (Signed)
Addended by: Antonieta Iba on: 04/14/2021 03:07 PM   Modules accepted: Orders

## 2021-04-15 ENCOUNTER — Encounter: Payer: Self-pay | Admitting: *Deleted

## 2021-04-15 NOTE — Progress Notes (Signed)
Patient ID: [name], female   DOB: 1950-06-24, 70 y.o.   MRN: [id number] Patient enrolled for [employer] to ship a 30 day cardiac event monitor to her address on file.

## 2021-04-24 ENCOUNTER — Ambulatory Visit (INDEPENDENT_AMBULATORY_CARE_PROVIDER_SITE_OTHER): Payer: HMO

## 2021-04-24 DIAGNOSIS — R002 Palpitations: Secondary | ICD-10-CM | POA: Diagnosis not present

## 2021-04-27 ENCOUNTER — Other Ambulatory Visit (HOSPITAL_BASED_OUTPATIENT_CLINIC_OR_DEPARTMENT_OTHER): Payer: Self-pay

## 2021-05-14 ENCOUNTER — Telehealth: Payer: Self-pay | Admitting: Cardiology

## 2021-05-14 NOTE — Telephone Encounter (Signed)
Patient currently has a event monitor on.  The company called her and advised her to return the monitor back.  They told her they only have 21-day devices.  She wanted to let us know that she was returned the monitor today.

## 2021-05-19 ENCOUNTER — Other Ambulatory Visit (HOSPITAL_BASED_OUTPATIENT_CLINIC_OR_DEPARTMENT_OTHER): Payer: Self-pay

## 2021-05-19 MED FILL — Glucose Blood Test Strip: 50 days supply | Qty: 100 | Fill #2 | Status: AC

## 2021-05-22 ENCOUNTER — Encounter: Payer: Self-pay | Admitting: Cardiology

## 2021-05-28 ENCOUNTER — Telehealth: Payer: Self-pay

## 2021-05-28 DIAGNOSIS — I10 Essential (primary) hypertension: Secondary | ICD-10-CM

## 2021-05-28 DIAGNOSIS — R002 Palpitations: Secondary | ICD-10-CM

## 2021-05-28 NOTE — Telephone Encounter (Signed)
-----   Message from Sueanne Margarita, MD sent at 05/22/2021 10:10 PM EST ----- Heart monitor showed extra heart beats from the bottom of her heart called nonsustained ventricular tachycardia. Please get a stress MRI.  Also have her come in for BMET, Mag and TSH

## 2021-05-28 NOTE — Telephone Encounter (Signed)
The patient has been notified of the result and verbalized understanding.  All questions (if any) were answered. Antonieta Iba, RN 05/28/2021 2:33 PM  Stress MRI has been ordered. Lab work has been scheduled

## 2021-05-29 ENCOUNTER — Other Ambulatory Visit: Payer: HMO | Admitting: *Deleted

## 2021-05-29 ENCOUNTER — Other Ambulatory Visit: Payer: Self-pay

## 2021-05-29 DIAGNOSIS — R002 Palpitations: Secondary | ICD-10-CM | POA: Diagnosis not present

## 2021-05-29 DIAGNOSIS — I10 Essential (primary) hypertension: Secondary | ICD-10-CM | POA: Diagnosis not present

## 2021-05-29 LAB — BASIC METABOLIC PANEL WITH GFR
BUN/Creatinine Ratio: 16 (ref 12–28)
BUN: 16 mg/dL (ref 8–27)
CO2: 23 mmol/L (ref 20–29)
Calcium: 9.8 mg/dL (ref 8.7–10.3)
Chloride: 101 mmol/L (ref 96–106)
Creatinine, Ser: 1.02 mg/dL — ABNORMAL HIGH (ref 0.57–1.00)
Glucose: 225 mg/dL — ABNORMAL HIGH (ref 70–99)
Potassium: 4.3 mmol/L (ref 3.5–5.2)
Sodium: 140 mmol/L (ref 134–144)
eGFR: 60 mL/min/1.73

## 2021-05-29 LAB — TSH: TSH: 1.18 u[IU]/mL (ref 0.450–4.500)

## 2021-05-29 LAB — MAGNESIUM: Magnesium: 1.7 mg/dL (ref 1.6–2.3)

## 2021-06-04 ENCOUNTER — Telehealth: Payer: Self-pay | Admitting: Cardiology

## 2021-06-04 MED ORDER — MAGNESIUM OXIDE 400 MG PO CAPS: 400.0000 mg | ORAL_CAPSULE | Freq: Every day | ORAL | 3 refills | Status: AC

## 2021-06-04 MED ORDER — LORAZEPAM 1 MG PO TABS: 1.0000 mg | ORAL_TABLET | Freq: Once | ORAL | 0 refills | Status: AC

## 2021-06-04 NOTE — Telephone Encounter (Signed)
° °  Pt is returning call to get lab result °

## 2021-06-04 NOTE — Telephone Encounter (Signed)
Christine Bergeron, MD  05/29/2021  8:05 PM EST     Her magnesium is still low which may be making palpitations worse. How much supplementation is she taking? If she is only taking 1 pill a day (400mg ), let's increase it to 2 pills per day (up to 800mg  daily). Otherwise her TSH is normal and electrolytes look good.  The patient has been notified of the result and verbalized understanding.  All questions (if any) were answered. Christine Iba, RN 06/04/2021 10:37 AM   Patient is not currently taking magnesium supplement. She thinks she was taking 400 mg daily which she will restart.    She also reports that she is claustrophobic. She has a cardiac stress MRI on 1/12 and is wondering if she could have something prescribed for her to take prior.

## 2021-06-04 NOTE — Telephone Encounter (Signed)
Ativan has been called in. Patient is aware.

## 2021-06-23 ENCOUNTER — Other Ambulatory Visit (HOSPITAL_COMMUNITY): Payer: Self-pay | Admitting: Emergency Medicine

## 2021-06-23 DIAGNOSIS — R002 Palpitations: Secondary | ICD-10-CM

## 2021-06-24 ENCOUNTER — Encounter (HOSPITAL_COMMUNITY): Payer: Self-pay

## 2021-06-24 ENCOUNTER — Telehealth (HOSPITAL_COMMUNITY): Payer: Self-pay | Admitting: Emergency Medicine

## 2021-06-24 ENCOUNTER — Telehealth (HOSPITAL_COMMUNITY): Payer: Self-pay | Admitting: *Deleted

## 2021-06-24 NOTE — Telephone Encounter (Signed)
Patient returning call regarding upcoming cardiac imaging study; pt verbalizes understanding of appt date/time, parking situation and where to check in, and medications ordered; name and call back number provided for further questions should they arise  Gordy Clement RN Navigator Cardiac Imaging [facility] [phone] office [phone] cell  Arrival at 10:15am and is has 1mg  ativan for her claustrophobia.  She denies metal and is aware to avoid caffeine prior to test.

## 2021-06-24 NOTE — Telephone Encounter (Signed)
Attempted to call patient regarding upcoming cardiac MR appointment. Left message on voicemail with name and callback number Neisha Hinger RN Navigator Cardiac Imaging [facility] [phone] Office [phone] Cell  

## 2021-06-25 ENCOUNTER — Ambulatory Visit (HOSPITAL_COMMUNITY)
Admission: RE | Admit: 2021-06-25 | Discharge: 2021-06-25 | Disposition: A | Discharge status: Home / Self Care | Payer: HMO | Source: Ambulatory Visit | Attending: Cardiology | Admitting: Cardiology

## 2021-06-25 ENCOUNTER — Encounter: Payer: Self-pay | Admitting: Cardiology

## 2021-06-25 ENCOUNTER — Other Ambulatory Visit: Payer: Self-pay

## 2021-06-25 DIAGNOSIS — I422 Other hypertrophic cardiomyopathy: Secondary | ICD-10-CM | POA: Diagnosis not present

## 2021-06-25 DIAGNOSIS — R002 Palpitations: Secondary | ICD-10-CM | POA: Diagnosis not present

## 2021-06-25 DIAGNOSIS — I10 Essential (primary) hypertension: Secondary | ICD-10-CM | POA: Insufficient documentation

## 2021-06-25 MED ORDER — GADOBUTROL 1 MMOL/ML IV SOLN
10.0000 mL | Freq: Once | INTRAVENOUS | Status: AC | PRN
  Administered 2021-06-25: 10 mL via INTRAVENOUS

## 2021-06-25 MED ORDER — REGADENOSON 0.4 MG/5ML IV SOLN
INTRAVENOUS | Status: AC
  Filled 2021-06-25: qty 5

## 2021-06-25 MED ORDER — REGADENOSON 0.4 MG/5ML IV SOLN
0.4000 mg | Freq: Once | INTRAVENOUS | Status: AC
  Administered 2021-06-25: 0.4 mg via INTRAVENOUS
  Filled 2021-06-25: qty 5

## 2021-06-25 NOTE — Progress Notes (Signed)
Pt received lexiscan without incident, denies complaints. Continuing with stress MR procedure.

## 2021-06-25 NOTE — Progress Notes (Signed)
Stress MR exam begun

## 2021-06-25 NOTE — Progress Notes (Signed)
Patient presents for stress MRI.  BP 130s, HR 86.  No caffeine intake in prior 12 hours. History of asthma but no wheezing on exam.  EKG today shows NSR, rate 86, RBBB, LAFB.   Shared Decision Making/Informed Consent The risks [chest pain, shortness of breath, cardiac arrhythmias, dizziness, blood pressure fluctuations, myocardial infarction, stroke/transient ischemic attack, nausea, vomiting, allergic reaction, and life-threatening complications (estimated to be 1 in 10,000)], benefits (risk stratification, diagnosing coronary artery disease, treatment guidance) and alternatives of a MRI stress test were discussed in detail with patient and they agree to proceed.

## 2021-06-25 NOTE — Progress Notes (Signed)
Patient assisted to sitting position on scanner table. Pt denies any adverse reaction, denies lightheadedness or dizziness. Pt escorted to MR waiting room where her daughter was waiting. The patient and daughter went into the dressing room to change. Pt then was escorted to [place] entrance where they used [facility] parking. Steady gait was noted on patient during ambulation.  Marchia Bond RN Navigator Cardiac Imaging [facility] [phone] Office  [phone] Cell

## 2021-06-26 ENCOUNTER — Encounter: Payer: Self-pay | Admitting: Cardiology

## 2021-06-26 DIAGNOSIS — I421 Obstructive hypertrophic cardiomyopathy: Secondary | ICD-10-CM | POA: Insufficient documentation

## 2021-06-30 ENCOUNTER — Telehealth: Payer: Self-pay

## 2021-06-30 ENCOUNTER — Telehealth: Payer: Self-pay | Admitting: Cardiology

## 2021-06-30 DIAGNOSIS — I421 Obstructive hypertrophic cardiomyopathy: Secondary | ICD-10-CM

## 2021-06-30 DIAGNOSIS — I4729 Other ventricular tachycardia: Secondary | ICD-10-CM

## 2021-06-30 NOTE — Telephone Encounter (Signed)
-----   Message from Sueanne Margarita, MD sent at 06/30/2021  2:55 PM EST ----- Given her NSVT and cMRI findings concerning for HOCM please refer to EP for evaluation ----- Message ----- From: Antonieta Iba, RN Sent: 06/30/2021   2:29 PM EST To: Sueanne Margarita, MD  The patient has been notified of the result and verbalized understanding.  All questions (if any) were answered. Antonieta Iba, RN 06/30/2021 2:28 PM   Patient reports no history of sudden cardiac death in her family. She does not have a history of syncope. Patient just wore a heart monitor in December 2022, would like to know if she needs another. Results showed NSVT which prompted order for stress MRI.

## 2021-06-30 NOTE — Telephone Encounter (Signed)
Patient returned call for results.  Call transferred to nurse [facility]

## 2021-06-30 NOTE — Telephone Encounter (Signed)
Spoke with the patient and advised her that Dr. Radford Pax has referred her to EP. Patient verbalized understanding and is aware that we will contact her to schedule an appointment.

## 2021-07-01 NOTE — Telephone Encounter (Signed)
Pt would like to re-discuss MRI findings. Briefly discussed with patient and educated to a degree.  Aware further workup needed for diagnosis and reason she is being referred to EP. Pt aware Carly will follow up with her this week to further discuss this again.

## 2021-07-01 NOTE — Telephone Encounter (Signed)
Patient was calling back with questions/concerns. Please advise 

## 2021-07-02 ENCOUNTER — Other Ambulatory Visit: Payer: Self-pay | Admitting: Physician Assistant

## 2021-07-02 ENCOUNTER — Other Ambulatory Visit (HOSPITAL_BASED_OUTPATIENT_CLINIC_OR_DEPARTMENT_OTHER): Payer: Self-pay

## 2021-07-02 MED ORDER — OMEPRAZOLE 40 MG PO CPDR
DELAYED_RELEASE_CAPSULE | Freq: Two times a day (BID) | ORAL | 1 refills | Status: DC
  Filled 2021-07-02: qty 180, 90d supply, fill #0
  Filled 2021-09-30: qty 180, 90d supply, fill #1

## 2021-07-03 NOTE — Telephone Encounter (Signed)
Attempted to call patient. Unable to leave voicemail.  

## 2021-07-10 DIAGNOSIS — I10 Essential (primary) hypertension: Secondary | ICD-10-CM | POA: Diagnosis not present

## 2021-07-10 DIAGNOSIS — K219 Gastro-esophageal reflux disease without esophagitis: Secondary | ICD-10-CM | POA: Diagnosis not present

## 2021-07-10 DIAGNOSIS — K5900 Constipation, unspecified: Secondary | ICD-10-CM | POA: Diagnosis not present

## 2021-07-10 DIAGNOSIS — E1169 Type 2 diabetes mellitus with other specified complication: Secondary | ICD-10-CM | POA: Diagnosis not present

## 2021-07-10 DIAGNOSIS — E1142 Type 2 diabetes mellitus with diabetic polyneuropathy: Secondary | ICD-10-CM | POA: Diagnosis not present

## 2021-07-10 DIAGNOSIS — E785 Hyperlipidemia, unspecified: Secondary | ICD-10-CM | POA: Diagnosis not present

## 2021-07-10 DIAGNOSIS — F324 Major depressive disorder, single episode, in partial remission: Secondary | ICD-10-CM | POA: Diagnosis not present

## 2021-07-10 DIAGNOSIS — J45909 Unspecified asthma, uncomplicated: Secondary | ICD-10-CM | POA: Diagnosis not present

## 2021-07-10 DIAGNOSIS — E1165 Type 2 diabetes mellitus with hyperglycemia: Secondary | ICD-10-CM | POA: Diagnosis not present

## 2021-07-10 DIAGNOSIS — E669 Obesity, unspecified: Secondary | ICD-10-CM | POA: Diagnosis not present

## 2021-07-10 DIAGNOSIS — J309 Allergic rhinitis, unspecified: Secondary | ICD-10-CM | POA: Diagnosis not present

## 2021-07-10 DIAGNOSIS — G4733 Obstructive sleep apnea (adult) (pediatric): Secondary | ICD-10-CM | POA: Diagnosis not present

## 2021-07-10 LAB — HM DEXA SCAN: HM Dexa Scan: NORMAL

## 2021-07-13 ENCOUNTER — Other Ambulatory Visit (HOSPITAL_BASED_OUTPATIENT_CLINIC_OR_DEPARTMENT_OTHER): Payer: Self-pay

## 2021-07-13 MED FILL — Glucose Blood Test Strip: 50 days supply | Qty: 100 | Fill #3 | Status: AC

## 2021-07-21 ENCOUNTER — Other Ambulatory Visit (HOSPITAL_BASED_OUTPATIENT_CLINIC_OR_DEPARTMENT_OTHER): Payer: Self-pay

## 2021-07-21 ENCOUNTER — Ambulatory Visit (INDEPENDENT_AMBULATORY_CARE_PROVIDER_SITE_OTHER): Payer: HMO | Admitting: Internal Medicine

## 2021-07-21 ENCOUNTER — Encounter: Payer: Self-pay | Admitting: Internal Medicine

## 2021-07-21 VITALS — BP 126/80 | HR 74 | Ht 63.0 in | Wt 186.4 lb

## 2021-07-21 DIAGNOSIS — E785 Hyperlipidemia, unspecified: Secondary | ICD-10-CM

## 2021-07-21 DIAGNOSIS — G8929 Other chronic pain: Secondary | ICD-10-CM | POA: Diagnosis not present

## 2021-07-21 DIAGNOSIS — M25561 Pain in right knee: Secondary | ICD-10-CM | POA: Diagnosis not present

## 2021-07-21 DIAGNOSIS — M25562 Pain in left knee: Secondary | ICD-10-CM | POA: Diagnosis not present

## 2021-07-21 DIAGNOSIS — E1165 Type 2 diabetes mellitus with hyperglycemia: Secondary | ICD-10-CM

## 2021-07-21 LAB — POCT GLYCOSYLATED HEMOGLOBIN (HGB A1C): Hemoglobin A1C: 7.6 % — AB (ref 4.0–5.6)

## 2021-07-21 MED ORDER — METFORMIN HCL 1000 MG PO TABS
1000.0000 mg | ORAL_TABLET | Freq: Two times a day (BID) | ORAL | 3 refills | Status: DC
  Filled 2021-07-21 – 2021-07-30 (×2): qty 180, 90d supply, fill #0
  Filled 2021-10-29: qty 180, 90d supply, fill #1
  Filled 2022-02-02: qty 180, 90d supply, fill #2

## 2021-07-21 MED ORDER — RYBELSUS 7 MG PO TABS
7.0000 mg | ORAL_TABLET | Freq: Every day | ORAL | 3 refills | Status: DC
  Filled 2021-07-21: qty 90, 90d supply, fill #0
  Filled 2021-11-30: qty 30, 30d supply, fill #1
  Filled 2021-12-24: qty 30, 30d supply, fill #2

## 2021-07-21 MED ORDER — GLIPIZIDE 5 MG PO TABS
ORAL_TABLET | ORAL | 3 refills | Status: DC
  Filled 2021-07-21 – 2021-09-01 (×2): qty 180, 90d supply, fill #0
  Filled 2021-11-30: qty 180, 90d supply, fill #1

## 2021-07-21 NOTE — Progress Notes (Signed)
Name: [name]  Age/ Sex: 71 y.o., female   MRN/ DOB: [id number], 07/18/1950     PCP: Mosie Lukes, MD   Reason for Endocrinology Evaluation: Type 2 Diabetes Mellitus  Initial Endocrine Consultative Visit: 02/22/2017    PATIENT IDENTIFIER: [name] is a 71 y.o. female with a past medical history of T2DM, HTN , Dyslipidemia, Hx of breast Ca ( S/P right lumpectomy) . The patient has followed with Endocrinology clinic since 02/16/2019 for consultative assistance with management of her diabetes.     DIABETIC HISTORY:  [name] was diagnosed with T2DM in 2001. She has been on metformin, Glimepiride and Januvia. Invokana added in 2018 but was cost prohibitive. Her hemoglobin A1c has ranged from 7.0% in 2016, peaking at 8.8% in  2021.    She used to see Dr.Gherghe but was lost to follow up by 2019 , until her return to our clinic 08/2019.    Started Rybelsus 08/2020 but this had to be stopped by the end of 2022 due to inability to qualify for pt assistance   SUBJECTIVE:   During the last visit (04/07/2021):  This was a virtual visit . Stopped  Rybelsus,  and continued Metformin , increased  Glipizide      Today (07/21/2021): [name] is here for a follow up on diabetes management.  She checks her blood sugars 2 times daily The patient has not had hypoglycemic episodes since the last clinic visit.    Weight stable  Denies diarrhea or nausea or vomiting  She is c/o knee pains, started 8 months ago, worsening in nature, unsteady on her feet due to pain.   Takes Tylenol as needed and topical voltaren    HOME DIABETES REGIMEN:  Metformin 1000 mg, BID Glipizide 5 mg , 2 tablets QAM and 1 tab QPM     Statin:  Crestor ACE-I/ARB: No   METER DOWNLOAD SUMMARY:  89-278 mg/dL    DIABETIC COMPLICATIONS: Microvascular complications:   Denies: CKD, retinopathy , neuropathy Last Eye Exam: Completed 01/09/2021  Macrovascular complications:   Denies:  CAD, CVA, PVD   HISTORY:  Past Medical History:  Past Medical History:  Diagnosis Date   Acute bronchitis 10/31/2015   Allergy    seasonal   Arthritis    knee-left   Cancer ([place]) 04/2018   right breast ca   Cataract    right eye   Diabetes mellitus 52   type 2   Diaphragmatic hernia without mention of obstruction or gangrene    Endometriosis    Esophageal reflux    Esophageal stricture    GERD (gastroesophageal reflux disease)    HOCM (hypertrophic obstructive cardiomyopathy) (HCC)    Basal septal hypertrophy 16 mm with increased ECV at 30% on cardiac MRI 06/2021 consistent with HOCM   Hyperlipidemia    Hyperplastic colon polyp    Hypertension 52   Nonalcoholic fatty liver disease 04/28/2010   Qualifier: Diagnosis of  By: Arnoldo Morale MD, John E    Nonspecific abnormal results of liver function study    NSVT (nonsustained ventricular tachycardia)    noted on event monitor 05/2021   Personal history of radiation therapy 08/2018   bilateral breasts   Pharyngitis 02/18/2012   Plantar fascial fibromatosis    Preventative health care 01/22/2013   Seasonal allergies 10/31/2015   Sleep apnea    has oral appliance but does not fit well due to missing teeth   Sore throat 04/03/2017  Tubular adenoma of colon    Unspecified sleep apnea    no c-pap   Past Surgical History:  Past Surgical History:  Procedure Laterality Date   ABDOMINAL HYSTERECTOMY  1982   partial   APPENDECTOMY     benigh cyst in lung  2006   right   BREAST BIOPSY Bilateral 04/03/2018   BREAST BIOPSY Bilateral 03/23/2018   BREAST CYST INCISION AND DRAINAGE     right breast   BREAST EXCISIONAL BIOPSY Right 2019   BREAST LUMPECTOMY Bilateral 04/2018   BREAST LUMPECTOMY WITH RADIOACTIVE SEED LOCALIZATION Bilateral 05/02/2018   Procedure: RIGHT BREAST SEED LUMPECTOMY X2 AND LEFT BREAST SEED GUIDED LUMPECTOMY;  Surgeon: [name], MD;  Location: [facility];  Service: General;  Laterality:  Bilateral;   CATARACT EXTRACTION     right eye   HERNIA REPAIR     per pt, she is not aware of this surgery   RE-EXCISION OF BREAST LUMPECTOMY Right 05/25/2018   Procedure: RE-EXCISION OF RIGHT BREAST LUMPECTOMY;  Surgeon: [name], MD;  Location: [facility];  Service: General;  Laterality: Right;   seba     TUBAL LIGATION     Social History:  reports that she has never smoked. She has never used smokeless tobacco. She reports that she does not drink alcohol and does not use drugs. Family History:  Family History  Problem Relation Age of Onset   Breast cancer Mother    Hypertension Mother    Diabetes Mother        type 2   Other Mother        esophagus surgery   Heart disease Father    Hyperlipidemia Father    Hypertension Father    Heart attack Father        MI at age 4   Diabetes Brother        type 2   Heart disease Maternal Grandmother    Heart attack Maternal Grandfather    Hypertension Maternal Grandfather    Stroke Paternal Grandmother    Hypertension Sister    Diabetes Sister    Hypertension Brother    Alcohol abuse Maternal Aunt    Cancer Maternal Aunt    Colon cancer Neg Hx    Stomach cancer Neg Hx    Esophageal cancer Neg Hx      HOME MEDICATIONS: Allergies as of 07/21/2021       Reactions   Pravastatin Other (See Comments)   Myalgia, fatigue, memory loss   Amoxicillin Itching   REACTION: rash   Atorvastatin Other (See Comments)   Myalgia, fatigue, memory loss   Codeine Nausea And Vomiting        Medication List        Accurate as of July 21, 2021 10:50 AM. If you have any questions, ask your nurse or doctor.          albuterol 108 (90 Base) MCG/ACT inhaler Commonly known as: VENTOLIN HFA Inhale 2 puffs by mouth into the lungs every 6 (six) hours as needed for wheezing or shortness of breath.   Aspir-Low 81 MG EC tablet Generic drug: aspirin TAKE 1 TABLET BY MOUTH DAILY   bisoprolol-hydrochlorothiazide 5-6.25  MG tablet Commonly known as: ZIAC Take 1 tablet by mouth daily.   fluticasone 50 MCG/ACT nasal spray Commonly known as: FLONASE Place 2 sprays into both nostrils daily.   glipiZIDE 5 MG tablet Commonly known as: GLUCOTROL Take 2 tablets (10 mg total) by mouth  daily before breakfast AND 1 tablet (5 mg total) daily before supper.   Magnesium Oxide 400 MG Caps Take 1 capsule (400 mg total) by mouth daily.   metFORMIN 1000 MG tablet Commonly known as: GLUCOPHAGE Take 1 tablet (1,000 mg total) by mouth 2 (two) times daily with a meal.   montelukast 10 MG tablet Commonly known as: SINGULAIR Take 1 tablet (10 mg total) by mouth at bedtime as needed.   omeprazole 40 MG capsule Commonly known as: PRILOSEC TAKE 1 CAPSULE BY MOUTH TWICE DAILY   glucose blood test strip OneTouch Verio test strips   OneTouch Verio test strip Generic drug: glucose blood USE AS INSTRUCTED TWICE A DAY   rosuvastatin 5 MG tablet Commonly known as: CRESTOR Take 1 tablet (5 mg total) by mouth daily.   Rybelsus 7 MG Tabs Generic drug: Semaglutide Take 1 tablet by mouth daily.         OBJECTIVE:   Vital Signs: BP 126/80 (BP Location: Left Arm, Patient Position: Sitting, Cuff Size: Small)    Pulse 74    Ht 5\' 3"  (1.6 m)    Wt 186 lb 6.4 oz (84.6 kg)    SpO2 96%    BMI 33.02 kg/m      Exam: General: Pt appears well and is in NAD  Lungs: Clear with good BS bilat with no rales, rhonchi, or wheezes  Heart: RRR with normal S1 and S2 and no gallops; no murmurs; no rub  Abdomen: Normoactive bowel sounds, soft, nontender, without masses or organomegaly palpable  Extremities: No pretibial edema.  Crepitus noted over the left knee  Neuro: MS is good with appropriate affect, pt is alert and Ox3       DM foot exam 12/09/2020  The skin of the feet is intact without sores or ulcerations. The pedal pulses are 2+ on right and 2+ on left. The sensation is intact to a screening 5.07, 10 gram  monofilament bilaterally  DATA REVIEWED:  Lab Results  Component Value Date   HGBA1C 6.9 (A) 12/09/2020   HGBA1C 8.3 (A) 08/19/2020   HGBA1C 8.3 (A) 04/08/2020   Lab Results  Component Value Date   MICROALBUR <0.7 12/09/2020   LDLCALC 73 11/17/2020   CREATININE 1.02 (H) 05/29/2021   Lab Results  Component Value Date   MICRALBCREAT 3.8 12/09/2020     Lab Results  Component Value Date   CHOL 148 11/17/2020   HDL 39.70 11/17/2020   LDLCALC 73 11/17/2020   LDLDIRECT 96.0 08/19/2020   TRIG 176.0 (H) 11/17/2020   CHOLHDL 4 11/17/2020        Results for [name], [name] (MRN [id number]) as of 12/10/2020 13:50  Ref. Range 12/09/2020 10:54  Creatinine,U Latest Units: mg/dL 18.6  Microalb, Ur Latest Ref Range: 0.0 - 1.9 mg/dL <0.7  MICROALB/CREAT RATIO Latest Ref Range: 0.0 - 30.0 mg/g 3.8   ASSESSMENT / PLAN / RECOMMENDATIONS:   1) Type 2 Diabetes Mellitus,Sub-Optimally  Controlled, Without complications - Most recent A1c of 7.6  %. Goal A1c < 7.0 %.    -A1c increased from 6.9% to 7.6%, I suspect this is due to lack of Rybelsus intake - Unfortunately she has not been able to qualify for pt assistance for Rybelsus through [employer], but since this is the start of the year she will get it through her pharmacy -I am going to reduce glipizide preemptively to prevent hypoglycemia     MEDICATIONS:  - Continue Metformin 1000 mg , 1 tablet  with breakfast and dinner -Decrease glipizide 5 mg, 1 tablet before breakfast and 1 tablet before dinner  -Restart Rybelsus 7 mg daily    EDUCATION / INSTRUCTIONS: BG monitoring instructions: Patient is instructed to check her blood sugars 2 times a day, fasting and supper time. Call [facility] if: BG persistently < 70  I reviewed the Rule of 15 for the treatment of hypoglycemia in detail with the patient. Literature supplied.    2) Diabetic complications:  Eye: Does not have known diabetic retinopathy.  Neuro/  Feet: Does not have known diabetic peripheral neuropathy .  Renal: Patient does not have known baseline CKD. She   is not on an ACEI/ARB at present.  MA/CR.normal   3) Dyslipidemia: Patient is on crestor 5 mg, taking half a tablet , this is all she could tolerate due to arthralgia. She is already on vitamin D.    4) Knee pain:  -Patient with worsening chronic pain of both knees, she has seen sports medicine in the past -We will refer her back -Patient may continue Tylenol and Voltaren gel  F/U in 4 months   Signed electronically by: Mack Guise, MD  [facility]  [facility] [address] [place], [place] [address] Phone: [phone] FAX: [phone]   CC: Mosie Lukes, [address] [place] [address] Phone: [phone]  Fax: [phone]  Return to Endocrinology clinic as below: Future Appointments  Date Time Provider [place]  07/30/2021 10:00 AM Mosie Lukes, MD [facility]  08/18/2021 12:00 PM Vickie Epley, MD [facility]

## 2021-07-21 NOTE — Patient Instructions (Signed)
°-   Continue Metformin 1000 mg , 1 tablet with breakfast and 1 tablet with dinner - Decrease Glipizide 5 mg, 1 tablet before breakfast and 1 tablets before  dinner  - Continue  Rybelsus 7 mg, 1 tablet before breakfast       HOW TO TREAT LOW BLOOD SUGARS (Blood sugar LESS THAN 70 MG/DL) Please follow the RULE OF 15 for the treatment of hypoglycemia treatment (when your (blood sugars are less than 70 mg/dL)   STEP 1: Take 15 grams of carbohydrates when your blood sugar is low, which includes:  3-4 GLUCOSE TABS  OR 3-4 OZ OF JUICE OR REGULAR SODA OR ONE TUBE OF GLUCOSE GEL    STEP 2: RECHECK blood sugar in 15 MINUTES STEP 3: If your blood sugar is still low at the 15 minute recheck --> then, go back to STEP 1 and treat AGAIN with another 15 grams of carbohydrates.

## 2021-07-23 ENCOUNTER — Encounter: Payer: Self-pay | Admitting: Family Medicine

## 2021-07-23 ENCOUNTER — Ambulatory Visit: Payer: Self-pay

## 2021-07-23 ENCOUNTER — Ambulatory Visit: Payer: HMO | Admitting: Family Medicine

## 2021-07-23 DIAGNOSIS — M170 Bilateral primary osteoarthritis of knee: Secondary | ICD-10-CM

## 2021-07-23 MED ORDER — TRIAMCINOLONE ACETONIDE 40 MG/ML IJ SUSP
40.0000 mg | Freq: Once | INTRAMUSCULAR | Status: AC
  Administered 2021-07-23: 40 mg via INTRA_ARTICULAR

## 2021-07-23 NOTE — Progress Notes (Signed)
[name] - 71 y.o. female MRN [id number]  Date of birth: [birth year]  SUBJECTIVE:  Including CC & ROS.  No chief complaint on file.   [name] is a 71 y.o. female that is presenting with acute on chronic bilateral knee pain.  The pain is occurring over the medial compartment.  The pain has been present for several weeks.  No injury or inciting event.  No history of surgery.  Review of the note from 2/7 shows she was counseled on Tylenol therapy Independent review of the left knee x-ray from 2021 shows minor medial joint space narrowing.  Review of Systems See HPI   HISTORY: Past Medical, Surgical, Social, and Family History Reviewed & Updated per EMR.   Pertinent Historical Findings include:  Past Medical History:  Diagnosis Date   Acute bronchitis 10/31/2015   Allergy    seasonal   Arthritis    knee-left   Cancer ([place]) 04/2018   right breast ca   Cataract    right eye   Diabetes mellitus 52   type 2   Diaphragmatic hernia without mention of obstruction or gangrene    Endometriosis    Esophageal reflux    Esophageal stricture    GERD (gastroesophageal reflux disease)    HOCM (hypertrophic obstructive cardiomyopathy) (HCC)    Basal septal hypertrophy 16 mm with increased ECV at 30% on cardiac MRI 06/2021 consistent with HOCM   Hyperlipidemia    Hyperplastic colon polyp    Hypertension 52   Nonalcoholic fatty liver disease 04/28/2010   Qualifier: Diagnosis of  By: Arnoldo Morale MD, Balinda Quails    Nonspecific abnormal results of liver function study    NSVT (nonsustained ventricular tachycardia)    noted on event monitor 05/2021   Personal history of radiation therapy 08/2018   bilateral breasts   Pharyngitis 02/18/2012   Plantar fascial fibromatosis    Preventative health care 01/22/2013   Seasonal allergies 10/31/2015   Sleep apnea    has oral appliance but does not fit well due to missing teeth   Sore throat 04/03/2017   Tubular adenoma of colon     Unspecified sleep apnea    no c-pap    Past Surgical History:  Procedure Laterality Date   ABDOMINAL HYSTERECTOMY  1982   partial   APPENDECTOMY     benigh cyst in lung  2006   right   BREAST BIOPSY Bilateral 04/03/2018   BREAST BIOPSY Bilateral 03/23/2018   BREAST CYST INCISION AND DRAINAGE     right breast   BREAST EXCISIONAL BIOPSY Right 2019   BREAST LUMPECTOMY Bilateral 04/2018   BREAST LUMPECTOMY WITH RADIOACTIVE SEED LOCALIZATION Bilateral 05/02/2018   Procedure: RIGHT BREAST SEED LUMPECTOMY X2 AND LEFT BREAST SEED GUIDED LUMPECTOMY;  Surgeon: [name], MD;  Location: [facility];  Service: General;  Laterality: Bilateral;   CATARACT EXTRACTION     right eye   HERNIA REPAIR     per pt, she is not aware of this surgery   RE-EXCISION OF BREAST LUMPECTOMY Right 05/25/2018   Procedure: RE-EXCISION OF RIGHT BREAST LUMPECTOMY;  Surgeon: [name], MD;  Location: [facility];  Service: General;  Laterality: Right;   seba     TUBAL LIGATION       PHYSICAL EXAM:  VS: BP 120/74 (BP Location: Left Arm, Patient Position: Sitting)    Ht 5\' 3"  (1.6 m)    Wt 186 lb (84.4 kg)    BMI 32.95 kg/m  Physical Exam Gen: NAD, alert, cooperative with exam, well-appearing MSK:  Neurovascularly intact     Aspiration/Injection Procedure Note [name] [birth year]  Procedure: Injection Indications: Right knee pain  Procedure Details Consent: Risks of procedure as well as the alternatives and risks of each were explained to the (patient/caregiver).  Consent for procedure obtained. Time Out: Verified patient identification, verified procedure, site/side was marked, verified correct patient position, special equipment/implants available, medications/allergies/relevent history reviewed, required imaging and test results available.  Performed.  The area was cleaned with iodine and alcohol swabs.    The right knee superior lateral suprapatellar pouch  was injected using 3 cc of 1% lidocaine on a 25-gauge 1-1/2 inch needle.  An 18-gauge 1-1/2 needle was used to achieve aspiration.  The syringe was switched to mixture containing 1 cc's of 40 mg Kenalog and 4 cc's of 0.25% bupivacaine was injected.  Ultrasound was used. Images were obtained in long views showing the injection.    Amount of Fluid Aspirated:  18mL Character of Fluid: clear and straw colored Fluid was sent for: n/a  A sterile dressing was applied.  Patient did tolerate procedure well.  Aspiration/Injection Procedure Note [name] 03/25/1951  Procedure: Injection Indications: left knee pain  Procedure Details Consent: Risks of procedure as well as the alternatives and risks of each were explained to the (patient/caregiver).  Consent for procedure obtained. Time Out: Verified patient identification, verified procedure, site/side was marked, verified correct patient position, special equipment/implants available, medications/allergies/relevent history reviewed, required imaging and test results available.  Performed.  The area was cleaned with iodine and alcohol swabs.    The left knee superior lateral suprapatellar pouch was injected using 3 cc of 1% lidocaine on a 25-gauge 1-1/2 inch needle.  An 18-gauge 1-1/2 needle was used to achieve aspiration.  The syringe was switched to mixture containing 1 cc's of 40 mg Kenalog and 4 cc's of 0.25% bupivacaine was injected.  Ultrasound was used. Images were obtained in long views showing the injection.    Amount of Fluid Aspirated: 69mL Character of Fluid: clear and straw colored Fluid was sent for:n/a  A sterile dressing was applied.  Patient did tolerate procedure well.   ASSESSMENT & PLAN:   Osteoarthritis Acute on chronic in nature.  Does have degenerative changes and has patellofemoral symptoms as well. -Counseled on home exercise therapy and supportive care. -Aspiration injection today. -Referral to physical  therapy. -Could do imaging or gel injections.

## 2021-07-23 NOTE — Assessment & Plan Note (Addendum)
Acute on chronic in nature.  Does have degenerative changes and has patellofemoral symptoms as well. -Counseled on home exercise therapy and supportive care. -Aspiration injection today. -Referral to physical therapy. -Could do imaging or gel injections.

## 2021-07-23 NOTE — Patient Instructions (Signed)
Nice to meet you  Please try ice as needed  Please try the exercises  Please try physical therapy  Please send me a message in MyChart with any questions or updates.  Please see me back in 4-6 weeks.   --Dr. Raeford Razor

## 2021-07-30 ENCOUNTER — Ambulatory Visit: Payer: HMO | Admitting: Family Medicine

## 2021-07-30 ENCOUNTER — Other Ambulatory Visit (HOSPITAL_BASED_OUTPATIENT_CLINIC_OR_DEPARTMENT_OTHER): Payer: Self-pay

## 2021-07-30 ENCOUNTER — Ambulatory Visit (INDEPENDENT_AMBULATORY_CARE_PROVIDER_SITE_OTHER): Payer: HMO | Admitting: Family

## 2021-07-30 VITALS — BP 130/60 | HR 80 | Temp 98.6°F | Ht 63.0 in | Wt 184.0 lb

## 2021-07-30 DIAGNOSIS — G4700 Insomnia, unspecified: Secondary | ICD-10-CM | POA: Diagnosis not present

## 2021-07-30 MED ORDER — TRAZODONE HCL 50 MG PO TABS
25.0000 mg | ORAL_TABLET | Freq: Every evening | ORAL | 3 refills | Status: DC | PRN
  Filled 2021-07-30: qty 30, 30d supply, fill #0

## 2021-07-30 NOTE — Patient Instructions (Signed)
According to database, you have not had a Shingrix; you can get this with your pharmacist as we discussed;

## 2021-07-30 NOTE — Progress Notes (Signed)
[name] is a 71 y.o. female with the following history as recorded in EpicCare:  Patient Active Problem List   Diagnosis Date Noted   HOCM (hypertrophic obstructive cardiomyopathy) ([place]) 06/26/2021   Viral upper respiratory tract infection 04/06/2021   Heart palpitations 02/01/2021   Elevated lipase 02/01/2021   Need for pneumococcal vaccination 02/01/2021   Diabetes mellitus without complication ([place]) 78/58/8502   Chest wall asymmetry 12/05/2019   Insomnia 07/23/2019   Shingles 02/18/2019   Ductal carcinoma in situ (DCIS) of left breast 07/03/2018   Vertigo 03/13/2018   Ductal carcinoma in situ (DCIS) of right breast 03/10/2018   Bilateral calf pain 10/03/2017   Right shoulder pain 10/03/2017   Headache 06/30/2017   Obesity (BMI 30-39.9) 05/02/2017   Seasonal allergies 10/31/2015   Hx of adenomatous colonic polyps 04/30/2014   Cervical cancer screening 01/22/2013   Preventative health care 01/22/2013   Chest pain 01/06/2012   Endometriosis    Chronic cough 77/41/2878   Nonalcoholic fatty liver disease 04/28/2010   OSA (obstructive sleep apnea) 03/26/2009   RT BUNDLE BRANCH BLOCK&LT ANT FASCICULAR BLOCK 12/20/2008   Thoracic back pain 10/10/2008   Osteoarthritis 07/05/2008   Abdominal pain 07/05/2008   Aneurysm, thoracoabdominal 03/22/2007   Type 2 diabetes mellitus with hyperglycemia, without long-term current use of insulin ([place]) 10/28/2006   Hyperlipidemia, mixed 10/28/2006   Depression with anxiety 10/28/2006   Essential hypertension 10/28/2006   ESOPHAGEAL STRICTURE 05/04/2004   HIATAL HERNIA 05/04/2004    Current Outpatient Medications  Medication Sig Dispense Refill   ASPIR-LOW 81 MG EC tablet TAKE 1 TABLET BY MOUTH DAILY 30 tablet 3   bisoprolol-hydrochlorothiazide (ZIAC) 5-6.25 MG tablet Take 1 tablet by mouth daily. 90 tablet 1   fluticasone (FLONASE) 50 MCG/ACT nasal spray Place 2 sprays into both nostrils daily. 16 g 0   glipiZIDE (GLUCOTROL) 5 MG  tablet Take 1 tablet (5 mg total) by mouth daily before breakfast AND 1 tablet (5 mg total) daily before supper. 180 tablet 3   glucose blood test strip USE AS INSTRUCTED TWICE A DAY 100 strip 12   glucose blood test strip OneTouch Verio test strips     Magnesium Oxide 400 MG CAPS Take 1 capsule (400 mg total) by mouth daily. 90 capsule 3   MELATONIN PO Take 10 mg by mouth at bedtime.     metFORMIN (GLUCOPHAGE) 1000 MG tablet Take 1 tablet (1,000 mg total) by mouth 2 (two) times daily with a meal. 180 tablet 3   montelukast (SINGULAIR) 10 MG tablet Take 1 tablet (10 mg total) by mouth at bedtime as needed. 30 tablet 3   omeprazole (PRILOSEC) 40 MG capsule TAKE 1 CAPSULE BY MOUTH TWICE DAILY 180 capsule 1   rosuvastatin (CRESTOR) 5 MG tablet Take 1 tablet (5 mg total) by mouth daily. 90 tablet 1   Semaglutide (RYBELSUS) 7 MG TABS Take 1 tablet by mouth daily. 90 tablet 3   traZODone (DESYREL) 50 MG tablet Take 1/2 - 1 tablet (25-50 mg total) by mouth at bedtime as needed for sleep. 30 tablet 3   albuterol (VENTOLIN HFA) 108 (90 Base) MCG/ACT inhaler Inhale 2 puffs by mouth into the lungs every 6 (six) hours as needed for wheezing or shortness of breath. (Patient not taking: Reported on 07/30/2021) 18 g 0   No current facility-administered medications for this visit.   Facility-Administered Medications Ordered in Other Visits  Medication Dose Route Frequency Provider Last Rate Last Admin   Chlorhexidine Gluconate  Cloth 2 % PADS 6 each  6 each Topical Once [name], MD       And   Chlorhexidine Gluconate Cloth 2 % PADS 6 each  6 each Topical Once [name], MD        Allergies: Pravastatin, Amoxicillin, Atorvastatin, and Codeine  Past Medical History:  Diagnosis Date   Acute bronchitis 10/31/2015   Allergy    seasonal   Arthritis    knee-left   Cancer ([place]) 04/2018   right breast ca   Cataract    right eye   Diabetes mellitus 52   type 2   Diaphragmatic hernia without mention  of obstruction or gangrene    Endometriosis    Esophageal reflux    Esophageal stricture    GERD (gastroesophageal reflux disease)    HOCM (hypertrophic obstructive cardiomyopathy) ([facility])    Basal septal hypertrophy 16 mm with increased ECV at 30% on cardiac MRI 06/2021 consistent with HOCM   Hyperlipidemia    Hyperplastic colon polyp    Hypertension 52   Nonalcoholic fatty liver disease 04/28/2010   Qualifier: Diagnosis of  By: Arnoldo Morale MD, Balinda Quails    Nonspecific abnormal results of liver function study    NSVT (nonsustained ventricular tachycardia)    noted on event monitor 05/2021   Personal history of radiation therapy 08/2018   bilateral breasts   Pharyngitis 02/18/2012   Plantar fascial fibromatosis    Preventative health care 01/22/2013   Seasonal allergies 10/31/2015   Sleep apnea    has oral appliance but does not fit well due to missing teeth   Sore throat 04/03/2017   Tubular adenoma of colon    Unspecified sleep apnea    no c-pap    Past Surgical History:  Procedure Laterality Date   ABDOMINAL HYSTERECTOMY  1982   partial   APPENDECTOMY     benigh cyst in lung  2006   right   BREAST BIOPSY Bilateral 04/03/2018   BREAST BIOPSY Bilateral 03/23/2018   BREAST CYST INCISION AND DRAINAGE     right breast   BREAST EXCISIONAL BIOPSY Right 2019   BREAST LUMPECTOMY Bilateral 04/2018   BREAST LUMPECTOMY WITH RADIOACTIVE SEED LOCALIZATION Bilateral 05/02/2018   Procedure: RIGHT BREAST SEED LUMPECTOMY X2 AND LEFT BREAST SEED GUIDED LUMPECTOMY;  Surgeon: [name], MD;  Location: [facility];  Service: General;  Laterality: Bilateral;   CATARACT EXTRACTION     right eye   HERNIA REPAIR     per pt, she is not aware of this surgery   RE-EXCISION OF BREAST LUMPECTOMY Right 05/25/2018   Procedure: RE-EXCISION OF RIGHT BREAST LUMPECTOMY;  Surgeon: [name], MD;  Location: [facility];  Service: General;  Laterality: Right;   seba     TUBAL  LIGATION      Family History  Problem Relation Age of Onset   Breast cancer Mother    Hypertension Mother    Diabetes Mother        type 2   Other Mother        esophagus surgery   Heart disease Father    Hyperlipidemia Father    Hypertension Father    Heart attack Father        MI at age 86   Diabetes Brother        type 2   Heart disease Maternal Grandmother    Heart attack Maternal Grandfather    Hypertension Maternal Grandfather    Stroke Paternal Grandmother  Hypertension Sister    Diabetes Sister    Hypertension Brother    Alcohol abuse Maternal Aunt    Cancer Maternal Aunt    Colon cancer Neg Hx    Stomach cancer Neg Hx    Esophageal cancer Neg Hx     Social History   Tobacco Use   Smoking status: Never   Smokeless tobacco: Never  Substance Use Topics   Alcohol use: No    Alcohol/week: 0.0 standard drinks    Subjective:   6 month follow up on chronic care needs; her PCP is out of the office today; Patient is actively involved in her health- under care of cardiology, endocrinology and sports medicine;  Has been having increased issues with insomnia lately- taking Melatonin and wants to be sure this is safe; would like to discuss other non habit forming options as well;     Objective:  Vitals:   07/30/21 1054  BP: 130/60  Pulse: 80  Temp: 98.6 F (37 C)  TempSrc: Oral  SpO2: 97%  Weight: 184 lb (83.5 kg)  Height: 5\' 3"  (1.6 m)    General: Well developed, well nourished, in no acute distress  Skin : Warm and dry.  Head: Normocephalic and atraumatic  Lungs: Respirations unlabored;  Neurologic: Alert and oriented; speech intact; face symmetrical; moves all extremities well; CNII-XII intact without focal deficit   Assessment:  1. Insomnia, unspecified type     Plan:  Reassurance regarding Melatonin; can also try Trazodone; follow up with response;  She will plan to see her PCP in 6 months for yearly CPE;   This visit occurred during the  SARS-CoV-2 public health emergency.  Safety protocols were in place, including screening questions prior to the visit, additional usage of staff PPE, and extensive cleaning of exam room while observing appropriate contact time as indicated for disinfecting solutions.    No follow-ups on file.  No orders of the defined types were placed in this encounter.   Requested Prescriptions   Signed Prescriptions Disp Refills   traZODone (DESYREL) 50 MG tablet 30 tablet 3    Sig: Take 1/2 - 1 tablet (25-50 mg total) by mouth at bedtime as needed for sleep.

## 2021-08-13 ENCOUNTER — Encounter: Payer: Self-pay | Admitting: Family

## 2021-08-13 ENCOUNTER — Telehealth (INDEPENDENT_AMBULATORY_CARE_PROVIDER_SITE_OTHER): Payer: HMO | Admitting: Family

## 2021-08-13 ENCOUNTER — Ambulatory Visit: Payer: HMO | Admitting: Physical Therapy

## 2021-08-13 VITALS — Ht 63.0 in | Wt 185.0 lb

## 2021-08-13 DIAGNOSIS — U071 COVID-19: Secondary | ICD-10-CM

## 2021-08-13 NOTE — Progress Notes (Signed)
[name] is a 71 y.o. female with the following history as recorded in EpicCare:  Patient Active Problem List   Diagnosis Date Noted   HOCM (hypertrophic obstructive cardiomyopathy) ([place]) 06/26/2021   Viral upper respiratory tract infection 04/06/2021   Heart palpitations 02/01/2021   Elevated lipase 02/01/2021   Need for pneumococcal vaccination 02/01/2021   Diabetes mellitus without complication ([place]) 07/37/1062   Chest wall asymmetry 12/05/2019   Insomnia 07/23/2019   Shingles 02/18/2019   Ductal carcinoma in situ (DCIS) of left breast 07/03/2018   Vertigo 03/13/2018   Ductal carcinoma in situ (DCIS) of right breast 03/10/2018   Bilateral calf pain 10/03/2017   Right shoulder pain 10/03/2017   Headache 06/30/2017   Obesity (BMI 30-39.9) 05/02/2017   Seasonal allergies 10/31/2015   Hx of adenomatous colonic polyps 04/30/2014   Cervical cancer screening 01/22/2013   Preventative health care 01/22/2013   Chest pain 01/06/2012   Endometriosis    Chronic cough 69/48/5462   Nonalcoholic fatty liver disease 04/28/2010   OSA (obstructive sleep apnea) 03/26/2009   RT BUNDLE BRANCH BLOCK&LT ANT FASCICULAR BLOCK 12/20/2008   Thoracic back pain 10/10/2008   Osteoarthritis 07/05/2008   Abdominal pain 07/05/2008   Aneurysm, thoracoabdominal 03/22/2007   Type 2 diabetes mellitus with hyperglycemia, without long-term current use of insulin ([place]) 10/28/2006   Hyperlipidemia, mixed 10/28/2006   Depression with anxiety 10/28/2006   Essential hypertension 10/28/2006   ESOPHAGEAL STRICTURE 05/04/2004   HIATAL HERNIA 05/04/2004    Current Outpatient Medications  Medication Sig Dispense Refill   ASPIR-LOW 81 MG EC tablet TAKE 1 TABLET BY MOUTH DAILY 30 tablet 3   bisoprolol-hydrochlorothiazide (ZIAC) 5-6.25 MG tablet Take 1 tablet by mouth daily. 90 tablet 1   fluticasone (FLONASE) 50 MCG/ACT nasal spray Place 2 sprays into both nostrils daily. 16 g 0   glipiZIDE (GLUCOTROL) 5 MG  tablet Take 1 tablet (5 mg total) by mouth daily before breakfast AND 1 tablet (5 mg total) daily before supper. 180 tablet 3   glucose blood test strip USE AS INSTRUCTED TWICE A DAY 100 strip 12   glucose blood test strip OneTouch Verio test strips     Magnesium Oxide 400 MG CAPS Take 1 capsule (400 mg total) by mouth daily. 90 capsule 3   MELATONIN PO Take 10 mg by mouth at bedtime.     metFORMIN (GLUCOPHAGE) 1000 MG tablet Take 1 tablet (1,000 mg total) by mouth 2 (two) times daily with a meal. 180 tablet 3   montelukast (SINGULAIR) 10 MG tablet Take 1 tablet (10 mg total) by mouth at bedtime as needed. 30 tablet 3   omeprazole (PRILOSEC) 40 MG capsule TAKE 1 CAPSULE BY MOUTH TWICE DAILY 180 capsule 1   rosuvastatin (CRESTOR) 5 MG tablet Take 1 tablet (5 mg total) by mouth daily. 90 tablet 1   Semaglutide (RYBELSUS) 7 MG TABS Take 1 tablet by mouth daily. 90 tablet 3   albuterol (VENTOLIN HFA) 108 (90 Base) MCG/ACT inhaler Inhale 2 puffs by mouth into the lungs every 6 (six) hours as needed for wheezing or shortness of breath. (Patient not taking: Reported on 07/30/2021) 18 g 0   traZODone (DESYREL) 50 MG tablet Take 1/2 - 1 tablet (25-50 mg total) by mouth at bedtime as needed for sleep. (Patient not taking: Reported on 08/13/2021) 30 tablet 3   No current facility-administered medications for this visit.   Facility-Administered Medications Ordered in Other Visits  Medication Dose Route Frequency Provider Last Rate  Last Admin   Chlorhexidine Gluconate Cloth 2 % PADS 6 each  6 each Topical Once [name], MD       And   Chlorhexidine Gluconate Cloth 2 % PADS 6 each  6 each Topical Once [name], MD        Allergies: Pravastatin, Amoxicillin, Atorvastatin, and Codeine  Past Medical History:  Diagnosis Date   Acute bronchitis 10/31/2015   Allergy    seasonal   Arthritis    knee-left   Cancer ([place]) 04/2018   right breast ca   Cataract    right eye   Diabetes mellitus 52   type  2   Diaphragmatic hernia without mention of obstruction or gangrene    Endometriosis    Esophageal reflux    Esophageal stricture    GERD (gastroesophageal reflux disease)    HOCM (hypertrophic obstructive cardiomyopathy) ([facility])    Basal septal hypertrophy 16 mm with increased ECV at 30% on cardiac MRI 06/2021 consistent with HOCM   Hyperlipidemia    Hyperplastic colon polyp    Hypertension 52   Nonalcoholic fatty liver disease 04/28/2010   Qualifier: Diagnosis of  By: Arnoldo Morale MD, Balinda Quails    Nonspecific abnormal results of liver function study    NSVT (nonsustained ventricular tachycardia)    noted on event monitor 05/2021   Personal history of radiation therapy 08/2018   bilateral breasts   Pharyngitis 02/18/2012   Plantar fascial fibromatosis    Preventative health care 01/22/2013   Seasonal allergies 10/31/2015   Sleep apnea    has oral appliance but does not fit well due to missing teeth   Sore throat 04/03/2017   Tubular adenoma of colon    Unspecified sleep apnea    no c-pap    Past Surgical History:  Procedure Laterality Date   ABDOMINAL HYSTERECTOMY  1982   partial   APPENDECTOMY     benigh cyst in lung  2006   right   BREAST BIOPSY Bilateral 04/03/2018   BREAST BIOPSY Bilateral 03/23/2018   BREAST CYST INCISION AND DRAINAGE     right breast   BREAST EXCISIONAL BIOPSY Right 2019   BREAST LUMPECTOMY Bilateral 04/2018   BREAST LUMPECTOMY WITH RADIOACTIVE SEED LOCALIZATION Bilateral 05/02/2018   Procedure: RIGHT BREAST SEED LUMPECTOMY X2 AND LEFT BREAST SEED GUIDED LUMPECTOMY;  Surgeon: [name], MD;  Location: [facility];  Service: General;  Laterality: Bilateral;   CATARACT EXTRACTION     right eye   HERNIA REPAIR     per pt, she is not aware of this surgery   RE-EXCISION OF BREAST LUMPECTOMY Right 05/25/2018   Procedure: RE-EXCISION OF RIGHT BREAST LUMPECTOMY;  Surgeon: [name], MD;  Location: [facility];  Service:  General;  Laterality: Right;   seba     TUBAL LIGATION      Family History  Problem Relation Age of Onset   Breast cancer Mother    Hypertension Mother    Diabetes Mother        type 2   Other Mother        esophagus surgery   Heart disease Father    Hyperlipidemia Father    Hypertension Father    Heart attack Father        MI at age 62   Diabetes Brother        type 2   Heart disease Maternal Grandmother    Heart attack Maternal Grandfather    Hypertension Maternal Grandfather  Stroke Paternal Grandmother    Hypertension Sister    Diabetes Sister    Hypertension Brother    Alcohol abuse Maternal Aunt    Cancer Maternal Aunt    Colon cancer Neg Hx    Stomach cancer Neg Hx    Esophageal cancer Neg Hx     Social History   Tobacco Use   Smoking status: Never   Smokeless tobacco: Never  Substance Use Topics   Alcohol use: No    Alcohol/week: 0.0 standard drinks    Subjective:    I connected with Raynelle Bring Schnebly on 08/14/21 at  3:40 PM EST by a telephone call and verified that I am speaking with the correct person using two identifiers.   I discussed the limitations of evaluation and management by telemedicine and the availability of in person appointments. The patient expressed understanding and agreed to proceed. Provider in office/ patient is at home; provider and patient are only 2 people on telephone call.   Patient tested positive for COVID last Thursday initially; she was concerned when she re-tested today and still tested positive; ( is on day 7); notes that she is actually starting to feel better- headache and sore throat are improving; responding well to OTC medication for symptomatic treatment;     Objective:  Vitals:   08/13/21 1534  Weight: 185 lb (83.9 kg)  Height: 5\' 3"  (1.6 m)    Lungs: Respirations unlabored;  Neurologic: Alert and oriented; speech intact;   Assessment:  1. COVID-19     Plan:  Reassurance regarding persisting positive  test; she is clinically improving and feeling better; she will try testing again between day 10-14; if she feels that secondary sinus infection develops, she will call back; continue to rest and allow herself time to recover.   Time spent 10 minutes  No follow-ups on file.  No orders of the defined types were placed in this encounter.   Requested Prescriptions    No prescriptions requested or ordered in this encounter

## 2021-08-17 ENCOUNTER — Ambulatory Visit: Payer: HMO | Admitting: Physical Therapy

## 2021-08-18 ENCOUNTER — Institutional Professional Consult (permissible substitution): Payer: HMO | Admitting: Cardiology

## 2021-08-20 ENCOUNTER — Encounter: Payer: HMO | Admitting: Physical Therapy

## 2021-08-27 ENCOUNTER — Encounter: Payer: Self-pay | Admitting: Physical Therapy

## 2021-08-27 ENCOUNTER — Other Ambulatory Visit: Payer: Self-pay

## 2021-08-27 ENCOUNTER — Ambulatory Visit: Payer: HMO | Attending: Family Medicine | Admitting: Physical Therapy

## 2021-08-27 DIAGNOSIS — G8929 Other chronic pain: Secondary | ICD-10-CM | POA: Diagnosis not present

## 2021-08-27 DIAGNOSIS — M25561 Pain in right knee: Secondary | ICD-10-CM | POA: Insufficient documentation

## 2021-08-27 DIAGNOSIS — M25562 Pain in left knee: Secondary | ICD-10-CM | POA: Insufficient documentation

## 2021-08-27 DIAGNOSIS — M170 Bilateral primary osteoarthritis of knee: Secondary | ICD-10-CM | POA: Insufficient documentation

## 2021-08-27 DIAGNOSIS — M6281 Muscle weakness (generalized): Secondary | ICD-10-CM | POA: Diagnosis not present

## 2021-08-27 NOTE — Therapy (Signed)
?[facility] ?[address] ?[place], [place], [address] ?Phone: [phone]   Fax:  [phone] ? ?Physical Therapy Evaluation ? ?Patient Details  ?Name: [name] ?MRN: [id number] ?Date of Birth: November 12, 1950 ?Referring Provider (PT): Clearance Coots E. ? ? ?Encounter Date: 08/27/2021 ? ? PT End of Session - 08/27/21 1639   ? ? Visit Number 1   ? Number of Visits 12   ? Date for PT Re-Evaluation 10/08/21   ? Authorization Type [employer] - follow MCR guidelines   ? Progress Note Due on Visit 10   ? PT Start Time 1640   ? PT Stop Time 1735   ? PT Time Calculation (min) 55 min   ? Activity Tolerance Patient tolerated treatment well   ? Behavior During Therapy [facility] for tasks assessed/performed   ? ?  ?  ? ?  ? ? ?Past Medical History:  ?Diagnosis Date  ? Acute bronchitis 10/31/2015  ? Allergy   ? seasonal  ? Arthritis   ? knee-left  ? Cancer ([place]) 04/2018  ? right breast ca  ? Cataract   ? right eye  ? Diabetes mellitus 52  ? type 2  ? Diaphragmatic hernia without mention of obstruction or gangrene   ? Endometriosis   ? Esophageal reflux   ? Esophageal stricture   ? GERD (gastroesophageal reflux disease)   ? HOCM (hypertrophic obstructive cardiomyopathy) ([place])   ? Basal septal hypertrophy 16 mm with increased ECV at 30% on cardiac MRI 06/2021 consistent with HOCM  ? Hyperlipidemia   ? Hyperplastic colon polyp   ? Hypertension 52  ? Nonalcoholic fatty liver disease 04/28/2010  ? Qualifier: Diagnosis of  By: Arnoldo Morale MD, Balinda Quails   ? Nonspecific abnormal results of liver function study   ? NSVT (nonsustained ventricular tachycardia)   ? noted on event monitor 05/2021  ? Personal history of radiation therapy 08/2018  ? bilateral breasts  ? Pharyngitis 02/18/2012  ? Plantar fascial fibromatosis   ? Preventative health care 01/22/2013  ? Seasonal allergies 10/31/2015  ? Sleep apnea   ? has oral appliance but does not fit well due to missing teeth  ? Sore  throat 04/03/2017  ? Tubular adenoma of colon   ? Unspecified sleep apnea   ? no c-pap  ? ? ?Past Surgical History:  ?Procedure Laterality Date  ? ABDOMINAL HYSTERECTOMY  1982  ? partial  ? APPENDECTOMY    ? benigh cyst in lung  2006  ? right  ? BREAST BIOPSY Bilateral 04/03/2018  ? BREAST BIOPSY Bilateral 03/23/2018  ? BREAST CYST INCISION AND DRAINAGE    ? right breast  ? BREAST EXCISIONAL BIOPSY Right 2019  ? BREAST LUMPECTOMY Bilateral 04/2018  ? BREAST LUMPECTOMY WITH RADIOACTIVE SEED LOCALIZATION Bilateral 05/02/2018  ? Procedure: RIGHT BREAST SEED LUMPECTOMY X2 AND LEFT BREAST SEED GUIDED LUMPECTOMY;  Surgeon: [name], MD;  Location: [facility];  Service: General;  Laterality: Bilateral;  ? CATARACT EXTRACTION    ? right eye  ? HERNIA REPAIR    ? per pt, she is not aware of this surgery  ? RE-EXCISION OF BREAST LUMPECTOMY Right 05/25/2018  ? Procedure: RE-EXCISION OF RIGHT BREAST LUMPECTOMY;  Surgeon: [name], MD;  Location: [facility];  Service: General;  Laterality: Right;  ? seba    ? TUBAL LIGATION    ? ? ?There were no vitals filed for this visit. ? ? ?  Subjective Assessment - 08/27/21 1654   ? ? Subjective Pt. reports chronic L knee pain for about 15 years, but then R knee started hurting about 2 months ago, was referred to sports medicine and given injection in both knees on 07/23/21, since then her pain has improved significantly, but L knee starting to hurt again.   ? How long can you sit comfortably? no limits, but stiff if sits too long   ? How long can you stand comfortably? leans on sink to wash dishes because of knee pain, avoids just standing.   ? How long can you walk comfortably? used to walk on treadmill for 67mn, but stopped due to knee pain.  Outside walking can walk 30 min.   ? Diagnostic tests 03/05/20- xray L knee FINDINGS:  No evidence of fracture, dislocation, or joint effusion. No evidence  of arthropathy or other focal bone abnormality. Soft  tissues are  unremarkable.   ? Patient Stated Goals "get rid of knee pain"   ? Currently in Pain? Yes   ? Pain Score 4    7/10 descending stairs  ? Pain Location Knee   ? Pain Orientation Left   ? Pain Descriptors / Indicators Tightness   like its getting ready to brake  ? Pain Type Acute pain;Chronic pain   ? Pain Onset More than a month ago   ? Pain Frequency Intermittent   ? Aggravating Factors  stairs, prolonged standing, turning over in bed, getting down on floor   ? Pain Relieving Factors injections, voltaren gel, tylenol   ? Effect of Pain on Daily Activities can't clean baseboards, get down on floor, decreased activities   ? ?  ?  ? ?  ? ? ? ? ? [facility] PT Assessment - 08/27/21 0001   ? ?  ? Assessment  ? Medical Diagnosis M17.0 (ICD-10-CM) - Primary osteoarthritis of both knees   ? Referring Provider (PT) SRosemarie Ax   ? Next MD Visit 09/03/2021   ? Prior Therapy no   ?  ? Precautions  ? Precautions None   ?  ? Restrictions  ? Weight Bearing Restrictions No   ?  ? Balance Screen  ? Has the patient fallen in the past 6 months No   ? Has the patient had a decrease in activity level because of a fear of falling?  Yes   ? Is the patient reluctant to leave their home because of a fear of falling?  No   ?  ? Home Environment  ? Living Environment Private residence   ? Living Arrangements Spouse/significant other   ? Type of Home House   ? Home Access Stairs to enter   ? Entrance Stairs-Number of Steps 2   ? Home Layout Multi-level   ? Alternate Level Stairs-Number of Steps 12   ?  ? Prior Function  ? Level of Independence Independent with basic ADLs   ? Vocation Retired   ? Leisure caring for 344year old grand-daughter   ?  ? Cognition  ? Overall Cognitive Status Within Functional Limits for tasks assessed   ?  ? Observation/Other Assessments  ? Observations enters independently without deviation or device, no apparent distress.   ? Focus on Therapeutic Outcomes (FOTO)  knee:53%.  Predicted outcome 62% after 11  visits   ?  ? Posture/Postural Control  ? Posture/Postural Control No significant limitations   ?  ? ROM / Strength  ? AROM / PROM / Strength Strength;PROM   ?  ?  PROM  ? Overall PROM  Within functional limits for tasks performed   ? Overall PROM Comments 0-135 deg bil knee ROM   ?  ? Strength  ? Overall Strength Deficits   ? Overall Strength Comments tested in sitting   ? Strength Assessment Site Hip;Knee;Ankle   ? Right/Left Hip Right;Left   ? Right Hip Flexion 5/5   ? Right Hip Extension 4+/5   ? Right Hip ABduction 5/5   ? Right Hip ADduction 5/5   ? Left Hip Flexion 4+/5   ? Left Hip Extension 4+/5   ? Left Hip ABduction 5/5   ? Left Hip ADduction 5/5   ? Right/Left Knee Right;Left   ? Right Knee Flexion 4+/5   ? Right Knee Extension 5/5   ? Left Knee Flexion 4+/5   ? Left Knee Extension 3/5   quad lag 10 deg  ? Right/Left Ankle Right;Left   ? Right Ankle Dorsiflexion 5/5   ? Left Ankle Dorsiflexion 5/5   ?  ? Flexibility  ? Soft Tissue Assessment /Muscle Length yes   ? Quadriceps significant tightness, only able to bend knees to 90 deg in prone   ?  ? Palpation  ? Patella mobility WNL R, tightness inf/sup on L   ? Palpation comment no tenderness with palpation MCL, LCL, or pes anserine bil.  Noted trigger points L quad, R adductors and HS   ?  ? Special Tests  ? Other special tests negative for any knee instability or meniscus involvement.   ?  ? Transfers  ? Five time sit to stand comments  20 sec, increased bil knee pain   ?  ? Ambulation/Gait  ? Gait Pattern Within Functional Limits   ? ?  ?  ? ?  ? ? ? ? ? ? ? ? ? ? ? ? ? ?Objective measurements completed on examination: See above findings.  ? ? ? ? ? [facility] Adult PT Treatment/Exercise - 08/27/21 0001   ? ?  ? Exercises  ? Exercises Knee/Hip   ?  ? Knee/Hip Exercises: Supine  ? Quad Sets Strengthening;Left;10 reps   ? Quad Sets Limitations 5 sec hold   ? Straight Leg Raises Strengthening;Left;5 reps   ? Straight Leg Raises Limitations cues to maintain quad  set and full knee extension   ?  ? Knee/Hip Exercises: Prone  ? Hamstring Curl 10 reps   ? Hamstring Curl Limitations noted poor eccentric control, cues   ? Hip Extension Strengthening;Right;Left;10 reps

## 2021-08-27 NOTE — Patient Instructions (Signed)
Access Code: [email or web address] ?URL: [email or web address] ?Date: 08/27/2021 ?Prepared by: Glenetta Hew ? ?Exercises ?Supine Quad Set - 1 x daily - 7 x weekly - 2 sets - 10 reps - 5 sec hold ?Active Straight Leg Raise with Quad Set - 1 x daily - 7 x weekly - 2 sets - 10 reps ?Prone Knee Flexion - 1 x daily - 7 x weekly - 2 sets - 10 reps ?Beginner Prone Single Leg Raise - 1 x daily - 7 x weekly - 2 sets - 10 reps ? ?

## 2021-09-01 ENCOUNTER — Other Ambulatory Visit: Payer: Self-pay | Admitting: Family Medicine

## 2021-09-01 ENCOUNTER — Other Ambulatory Visit (HOSPITAL_BASED_OUTPATIENT_CLINIC_OR_DEPARTMENT_OTHER): Payer: Self-pay

## 2021-09-01 MED ORDER — ROSUVASTATIN CALCIUM 5 MG PO TABS
5.0000 mg | ORAL_TABLET | Freq: Every day | ORAL | 1 refills | Status: DC
  Filled 2021-09-01: qty 90, 90d supply, fill #0
  Filled 2021-11-30: qty 90, 90d supply, fill #1

## 2021-09-03 ENCOUNTER — Ambulatory Visit: Payer: HMO | Admitting: Family Medicine

## 2021-09-03 ENCOUNTER — Ambulatory Visit: Payer: HMO | Admitting: Physical Therapy

## 2021-09-03 ENCOUNTER — Other Ambulatory Visit: Payer: Self-pay | Admitting: Internal Medicine

## 2021-09-03 ENCOUNTER — Other Ambulatory Visit: Payer: Self-pay

## 2021-09-03 ENCOUNTER — Other Ambulatory Visit (HOSPITAL_BASED_OUTPATIENT_CLINIC_OR_DEPARTMENT_OTHER): Payer: Self-pay

## 2021-09-03 ENCOUNTER — Encounter: Payer: Self-pay | Admitting: Physical Therapy

## 2021-09-03 VITALS — BP 136/78 | Ht 63.0 in | Wt 184.0 lb

## 2021-09-03 DIAGNOSIS — M170 Bilateral primary osteoarthritis of knee: Secondary | ICD-10-CM

## 2021-09-03 DIAGNOSIS — M6281 Muscle weakness (generalized): Secondary | ICD-10-CM

## 2021-09-03 DIAGNOSIS — M25562 Pain in left knee: Secondary | ICD-10-CM

## 2021-09-03 DIAGNOSIS — G8929 Other chronic pain: Secondary | ICD-10-CM

## 2021-09-03 MED ORDER — ONETOUCH VERIO VI STRP
ORAL_STRIP | 12 refills | Status: DC
  Filled 2021-09-03: qty 100, 50d supply, fill #0
  Filled 2021-10-29: qty 100, 50d supply, fill #1
  Filled 2021-12-24: qty 100, 50d supply, fill #2
  Filled 2022-02-16: qty 100, 50d supply, fill #3
  Filled 2022-04-12: qty 100, 50d supply, fill #4
  Filled 2022-05-28: qty 100, 50d supply, fill #5
  Filled 2022-05-31: qty 100, 50d supply, fill #6

## 2021-09-03 NOTE — Patient Instructions (Signed)
?  Access Code: [email or web address] ?URL: [email or web address] ?Date: 09/03/2021 ?Prepared by: Glenetta Hew ? ?Exercises ?- Supine Quad Set  - 1 x daily - 7 x weekly - 2 sets - 10 reps - 5 sec  hold ?- Active Straight Leg Raise with Quad Set  - 1 x daily - 7 x weekly - 2 sets - 10 reps ?- Prone Knee Flexion  - 1 x daily - 7 x weekly - 2 sets - 10 reps ?- Beginner Prone Single Leg Raise  - 1 x daily - 7 x weekly - 2 sets - 10 reps ?- Seated Hamstring Stretch  - 2 x daily - 7 x weekly - 1 sets - 3 reps - 30 sec  hold ?- Supine Bridge  - 1 x daily - 7 x weekly - 2-3 sets - 10 reps ?- Hooklying Clamshell with Resistance  - 1 x daily - 7 x weekly - 2-3 sets - 10 reps ?- Clamshell  - 1 x daily - 7 x weekly - 2-3 sets - 10 reps ? ?

## 2021-09-03 NOTE — Therapy (Signed)
[facility] ?[facility] ?[address] ?[place], [place], [address] ?Phone: [phone]   Fax:  [phone] ? ?Physical Therapy Treatment ? ?Patient Details  ?Name: [name] ?MRN: [id number] ?Date of Birth: Oct 11, 1950 ?Referring Provider (PT): Clearance Coots E. ? ? ?Encounter Date: 09/03/2021 ? ? PT End of Session - 09/03/21 1320   ? ? Visit Number 2   ? Number of Visits 12   ? Date for PT Re-Evaluation 10/08/21   ? Authorization Type [employer] - follow MCR guidelines   ? Progress Note Due on Visit 10   ? PT Start Time 6387   ? PT Stop Time 1356   ? PT Time Calculation (min) 39 min   ? Activity Tolerance Patient tolerated treatment well   ? Behavior During Therapy [facility] for tasks assessed/performed   ? ?  ?  ? ?  ? ? ?Past Medical History:  ?Diagnosis Date  ? Acute bronchitis 10/31/2015  ? Allergy   ? seasonal  ? Arthritis   ? knee-left  ? Cancer ([place]) 04/2018  ? right breast ca  ? Cataract   ? right eye  ? Diabetes mellitus 52  ? type 2  ? Diaphragmatic hernia without mention of obstruction or gangrene   ? Endometriosis   ? Esophageal reflux   ? Esophageal stricture   ? GERD (gastroesophageal reflux disease)   ? HOCM (hypertrophic obstructive cardiomyopathy) ([place])   ? Basal septal hypertrophy 16 mm with increased ECV at 30% on cardiac MRI 06/2021 consistent with HOCM  ? Hyperlipidemia   ? Hyperplastic colon polyp   ? Hypertension 52  ? Nonalcoholic fatty liver disease 04/28/2010  ? Qualifier: Diagnosis of  By: Arnoldo Morale MD, Balinda Quails   ? Nonspecific abnormal results of liver function study   ? NSVT (nonsustained ventricular tachycardia)   ? noted on event monitor 05/2021  ? Personal history of radiation therapy 08/2018  ? bilateral breasts  ? Pharyngitis 02/18/2012  ? Plantar fascial fibromatosis   ? Preventative health care 01/22/2013  ? Seasonal allergies 10/31/2015  ? Sleep apnea   ? has oral appliance but does not fit well due to missing teeth  ? Sore  throat 04/03/2017  ? Tubular adenoma of colon   ? Unspecified sleep apnea   ? no c-pap  ? ? ?Past Surgical History:  ?Procedure Laterality Date  ? ABDOMINAL HYSTERECTOMY  1982  ? partial  ? APPENDECTOMY    ? benigh cyst in lung  2006  ? right  ? BREAST BIOPSY Bilateral 04/03/2018  ? BREAST BIOPSY Bilateral 03/23/2018  ? BREAST CYST INCISION AND DRAINAGE    ? right breast  ? BREAST EXCISIONAL BIOPSY Right 2019  ? BREAST LUMPECTOMY Bilateral 04/2018  ? BREAST LUMPECTOMY WITH RADIOACTIVE SEED LOCALIZATION Bilateral 05/02/2018  ? Procedure: RIGHT BREAST SEED LUMPECTOMY X2 AND LEFT BREAST SEED GUIDED LUMPECTOMY;  Surgeon: [name], MD;  Location: [facility];  Service: General;  Laterality: Bilateral;  ? CATARACT EXTRACTION    ? right eye  ? HERNIA REPAIR    ? per pt, she is not aware of this surgery  ? RE-EXCISION OF BREAST LUMPECTOMY Right 05/25/2018  ? Procedure: RE-EXCISION OF RIGHT BREAST LUMPECTOMY;  Surgeon: [name], MD;  Location: [facility];  Service: General;  Laterality: Right;  ? seba    ? TUBAL LIGATION    ? ? ?There were no vitals filed for this visit. ? ?  Subjective Assessment - 09/03/21 1318   ? ? Subjective Pt. reports at first the HEP "really tore my knees up" but now feeling better.  Also noted that her blood sugar was higher when the pain was higher, but that is back under control as well.   ? How long can you sit comfortably? no limits, but stiff if sits too long   ? How long can you stand comfortably? leans on sink to wash dishes because of knee pain, avoids just standing.   ? How long can you walk comfortably? used to walk on treadmill for 45mn, but stopped due to knee pain.  Outside walking can walk 30 min.   ? Diagnostic tests 03/05/20- xray L knee FINDINGS:  No evidence of fracture, dislocation, or joint effusion. No evidence  of arthropathy or other focal bone abnormality. Soft tissues are  unremarkable.   ? Patient Stated Goals "get rid of knee pain"   ?  Currently in Pain? Yes   ? Pain Score 4    ? Pain Location Knee   ? Pain Orientation Left   ? Pain Onset More than a month ago   ? ?  ?  ? ?  ? ? ? ? ? ? ? ? ? ? ? ? ? ? ? ? ? ? ? ? [facility] PT Treatment/Exercise - 09/03/21 0001   ? ?  ? Exercises  ? Exercises Knee/Hip   ?  ? Knee/Hip Exercises: Aerobic  ? Nustep L5 x 6 min   ?  ? Knee/Hip Exercises: Seated  ? Long ACSX CorporationStrengthening;Both;2 sets;10 reps   ? Other Seated Knee/Hip Exercises seated hamstring stretch 2 x 30 sec bil   ?  ? Knee/Hip Exercises: Supine  ? Bridges Strengthening;20 reps   ? Other Supine Knee/Hip Exercises supine clams RTB x 20   ?  ? Knee/Hip Exercises: Sidelying  ? Clams 2 x 10 bil, no resistance   ?  ? Manual Therapy  ? Manual Therapy Soft tissue mobilization   ? Soft tissue mobilization IASTM with s/s tools to bil quads, adductors to decrease muscle tightness and pain   ? ?  ?  ? ?  ? ? ? ? ? ? ? ? ? ? PT Education - 09/03/21 1401   ? ? Education Details HEP update   ? Person(s) Educated Patient   ? Methods Explanation;Demonstration;Verbal cues;Handout   ? Comprehension Verbalized understanding;Returned demonstration   ? ?  ?  ? ?  ? ? ? PT Short Term Goals - 09/03/21 1321   ? ?  ? PT SHORT TERM GOAL #1  ? Title Pt. will be compliant with initial HEP.   ? Time 2   ? Period Weeks   ? Status On-going   ? Target Date 09/10/21   ? ?  ?  ? ?  ? ? ? ? PT Long Term Goals - 09/03/21 1321   ? ?  ? PT LONG TERM GOAL #1  ? Title Pt. will be independent with progressed HEP to improve outcomes and carryover   ? Time 6   ? Period Weeks   ? Status On-going   ? Target Date 10/08/21   ?  ? PT LONG TERM GOAL #2  ? Title Pt. will demonstrate 5/5 L quad strength without pain for safety with gait.   ? Baseline noted 10 deg L quad lag and atrophy, fearful of L knee buckling   ? Time 6   ?  Period Weeks   ? Status On-going   ? Target Date 10/08/21   ?  ? PT LONG TERM GOAL #3  ? Title Pt. will score at least 62% on FOTO to demonstrate improved functional  mobility.   ? Baseline 62%   ? Time 6   ? Period Weeks   ? Status On-going   ? Target Date 10/08/21   ?  ? PT LONG TERM GOAL #4  ? Title Pt. will be able to descend 1 flight of stairs without increased knee pain to access home.   ? Time 6   ? Period Weeks   ? Status On-going   ? Target Date 10/08/21   ?  ? PT LONG TERM GOAL #5  ? Title Pt. will be able to stand for 15 minutes without increased bil knee pain to complete home ADLs.   ? Baseline leans against counter while washing dishes.   ? Time 6   ? Period Weeks   ? Status On-going   ? Target Date 10/08/21   ? ?  ?  ? ?  ? ? ? ? ? ? ? ? Plan - 09/03/21 1402   ? ? Clinical Impression Statement Pt. reported increased pain initially with HEP, but decreased back to baseline after 3 days.  Today she was able to tolerate all exercises without increased knee pain, and also demonstrated improved L quad strength, no lag with L knee extension.  Progressed HEP, she has RTB at home she will use. Finished session with IASTM to bil quads and adductors, no pain reported at end of session.   ? Personal Factors and Comorbidities Age;Comorbidity 3+   ? Comorbidities HTN, cardiomyopathy, Rt bundle block, T2DM, history of DCIS (bil with lumpectomy and radiation R).   ? Examination-Activity Limitations Bed Mobility;Carry;Locomotion Level;Squat;Transfers;Stand;Stairs;Bend   ? Examination-Participation Restrictions Cleaning;Meal Prep;Community Activity;Laundry;Shop;Other   caring for grand-daughter  ? Stability/Clinical Decision Making Evolving/Moderate complexity   ? Rehab Potential Good   ? PT Frequency Other (comment)   1-2x/week.  Pt. prefers 1x/week due to committment watching granddaughter.  ? PT Duration 6 weeks   ? PT Treatment/Interventions ADLs/Self Care Home Management;Cryotherapy;Electrical Stimulation;Iontophoresis '4mg'$ /ml Dexamethasone;Gait training;Stair training;Functional mobility training;Therapeutic activities;Balance training;Therapeutic exercise;Neuromuscular  re-education;Patient/family education;Manual techniques;Passive range of motion;Dry needling;Taping;Joint Manipulations   ? PT Next Visit Plan review and progress HEP for LE strengthening, add quad stretches, manual

## 2021-09-03 NOTE — Assessment & Plan Note (Signed)
Has gotten improvement. Has done well with the injections and physical therapy.  ?- counseled on home exercise therapy and supportive care ?- could consider gel injections or zilretta.  ?

## 2021-09-03 NOTE — Progress Notes (Signed)
?  [name] - 71 y.o. female MRN [id number]  Date of birth: 1950/12/04 ? ?SUBJECTIVE:  Including CC & ROS.  ?No chief complaint on file. ? ? ?[name] is a 71 y.o. female that is  following up for her knee pain. Has been doing well. No swelling. ? ? ? ?Review of Systems ?See HPI  ? ?HISTORY: Past Medical, Surgical, Social, and Family History Reviewed & Updated per EMR.   ?Pertinent Historical Findings include: ? ?Past Medical History:  ?Diagnosis Date  ? Acute bronchitis 10/31/2015  ? Allergy   ? seasonal  ? Arthritis   ? knee-left  ? Cancer ([place]) 04/2018  ? right breast ca  ? Cataract   ? right eye  ? Diabetes mellitus 52  ? type 2  ? Diaphragmatic hernia without mention of obstruction or gangrene   ? Endometriosis   ? Esophageal reflux   ? Esophageal stricture   ? GERD (gastroesophageal reflux disease)   ? HOCM (hypertrophic obstructive cardiomyopathy) ([place])   ? Basal septal hypertrophy 16 mm with increased ECV at 30% on cardiac MRI 06/2021 consistent with HOCM  ? Hyperlipidemia   ? Hyperplastic colon polyp   ? Hypertension 52  ? Nonalcoholic fatty liver disease 04/28/2010  ? Qualifier: Diagnosis of  By: Arnoldo Morale MD, Balinda Quails   ? Nonspecific abnormal results of liver function study   ? NSVT (nonsustained ventricular tachycardia)   ? noted on event monitor 05/2021  ? Personal history of radiation therapy 08/2018  ? bilateral breasts  ? Pharyngitis 02/18/2012  ? Plantar fascial fibromatosis   ? Preventative health care 01/22/2013  ? Seasonal allergies 10/31/2015  ? Sleep apnea   ? has oral appliance but does not fit well due to missing teeth  ? Sore throat 04/03/2017  ? Tubular adenoma of colon   ? Unspecified sleep apnea   ? no c-pap  ? ? ?Past Surgical History:  ?Procedure Laterality Date  ? ABDOMINAL HYSTERECTOMY  1982  ? partial  ? APPENDECTOMY    ? benigh cyst in lung  2006  ? right  ? BREAST BIOPSY Bilateral 04/03/2018  ? BREAST BIOPSY Bilateral 03/23/2018  ? BREAST CYST INCISION AND DRAINAGE    ?  right breast  ? BREAST EXCISIONAL BIOPSY Right 2019  ? BREAST LUMPECTOMY Bilateral 04/2018  ? BREAST LUMPECTOMY WITH RADIOACTIVE SEED LOCALIZATION Bilateral 05/02/2018  ? Procedure: RIGHT BREAST SEED LUMPECTOMY X2 AND LEFT BREAST SEED GUIDED LUMPECTOMY;  Surgeon: [name], MD;  Location: [facility];  Service: General;  Laterality: Bilateral;  ? CATARACT EXTRACTION    ? right eye  ? HERNIA REPAIR    ? per pt, she is not aware of this surgery  ? RE-EXCISION OF BREAST LUMPECTOMY Right 05/25/2018  ? Procedure: RE-EXCISION OF RIGHT BREAST LUMPECTOMY;  Surgeon: [name], MD;  Location: [facility];  Service: General;  Laterality: Right;  ? seba    ? TUBAL LIGATION    ? ? ? ?PHYSICAL EXAM:  ?VS: BP 136/78   Ht '5\' 3"'$  (1.6 m)   Wt 184 lb (83.5 kg)   BMI 32.59 kg/m?  ?Physical Exam ?Gen: NAD, alert, cooperative with exam, well-appearing ?MSK:  ?Neurovascularly intact   ? ? ? ? ?ASSESSMENT & PLAN:  ? ?Osteoarthritis ?Has gotten improvement. Has done well with the injections and physical therapy.  ?- counseled on home exercise therapy and supportive care ?- could consider gel injections or zilretta.  ? ? ? ? ?

## 2021-09-09 ENCOUNTER — Other Ambulatory Visit (HOSPITAL_BASED_OUTPATIENT_CLINIC_OR_DEPARTMENT_OTHER): Payer: Self-pay

## 2021-09-10 ENCOUNTER — Encounter: Payer: Self-pay | Admitting: Physical Therapy

## 2021-09-10 ENCOUNTER — Ambulatory Visit: Payer: HMO | Admitting: Physical Therapy

## 2021-09-10 DIAGNOSIS — G8929 Other chronic pain: Secondary | ICD-10-CM

## 2021-09-10 DIAGNOSIS — M170 Bilateral primary osteoarthritis of knee: Secondary | ICD-10-CM | POA: Diagnosis not present

## 2021-09-10 DIAGNOSIS — M25562 Pain in left knee: Secondary | ICD-10-CM

## 2021-09-10 DIAGNOSIS — M6281 Muscle weakness (generalized): Secondary | ICD-10-CM

## 2021-09-10 NOTE — Therapy (Signed)
[facility] ?[address] ?[place], [place], [address] ?Phone: [phone]   Fax:  [phone] ? ?Physical Therapy Treatment ? ?Patient Details  ?Name: [name] ?MRN: [id number] ?Date of Birth: November 21, 1950 ?Referring Provider (PT): Clearance Coots E. ? ? ?Encounter Date: 09/10/2021 ? ? PT End of Session - 09/10/21 1704   ? ? Visit Number 3   ? Number of Visits 12   ? Date for PT Re-Evaluation 10/08/21   ? Authorization Type [employer] - follow MCR guidelines   ? Progress Note Due on Visit 10   ? PT Start Time 1700   ? PT Stop Time 4081   ? PT Time Calculation (min) 45 min   ? Activity Tolerance Patient tolerated treatment well   ? Behavior During Therapy [facility] for tasks assessed/performed   ? ?  ?  ? ?  ? ? ?Past Medical History:  ?Diagnosis Date  ? Acute bronchitis 10/31/2015  ? Allergy   ? seasonal  ? Arthritis   ? knee-left  ? Cancer ([place]) 04/2018  ? right breast ca  ? Cataract   ? right eye  ? Diabetes mellitus 52  ? type 2  ? Diaphragmatic hernia without mention of obstruction or gangrene   ? Endometriosis   ? Esophageal reflux   ? Esophageal stricture   ? GERD (gastroesophageal reflux disease)   ? HOCM (hypertrophic obstructive cardiomyopathy) ([place])   ? Basal septal hypertrophy 16 mm with increased ECV at 30% on cardiac MRI 06/2021 consistent with HOCM  ? Hyperlipidemia   ? Hyperplastic colon polyp   ? Hypertension 52  ? Nonalcoholic fatty liver disease 04/28/2010  ? Qualifier: Diagnosis of  By: Arnoldo Morale MD, Balinda Quails   ? Nonspecific abnormal results of liver function study   ? NSVT (nonsustained ventricular tachycardia)   ? noted on event monitor 05/2021  ? Personal history of radiation therapy 08/2018  ? bilateral breasts  ? Pharyngitis 02/18/2012  ? Plantar fascial fibromatosis   ? Preventative health care 01/22/2013  ? Seasonal allergies 10/31/2015  ? Sleep apnea   ? has oral appliance but does not fit well due to missing teeth  ? Sore  throat 04/03/2017  ? Tubular adenoma of colon   ? Unspecified sleep apnea   ? no c-pap  ? ? ?Past Surgical History:  ?Procedure Laterality Date  ? ABDOMINAL HYSTERECTOMY  1982  ? partial  ? APPENDECTOMY    ? benigh cyst in lung  2006  ? right  ? BREAST BIOPSY Bilateral 04/03/2018  ? BREAST BIOPSY Bilateral 03/23/2018  ? BREAST CYST INCISION AND DRAINAGE    ? right breast  ? BREAST EXCISIONAL BIOPSY Right 2019  ? BREAST LUMPECTOMY Bilateral 04/2018  ? BREAST LUMPECTOMY WITH RADIOACTIVE SEED LOCALIZATION Bilateral 05/02/2018  ? Procedure: RIGHT BREAST SEED LUMPECTOMY X2 AND LEFT BREAST SEED GUIDED LUMPECTOMY;  Surgeon: [name], MD;  Location: [facility];  Service: General;  Laterality: Bilateral;  ? CATARACT EXTRACTION    ? right eye  ? HERNIA REPAIR    ? per pt, she is not aware of this surgery  ? RE-EXCISION OF BREAST LUMPECTOMY Right 05/25/2018  ? Procedure: RE-EXCISION OF RIGHT BREAST LUMPECTOMY;  Surgeon: [name], MD;  Location: [facility];  Service: General;  Laterality: Right;  ? seba    ? TUBAL LIGATION    ? ? ?There were no vitals filed for this visit. ? ?  Subjective Assessment - 09/10/21 1702   ? ? Subjective Pt. reported she just got back from the beach, she did a lot of walking this weekend and her knees were hurting.   ? How long can you sit comfortably? no limits, but stiff if sits too long   ? How long can you stand comfortably? leans on sink to wash dishes because of knee pain, avoids just standing.   ? How long can you walk comfortably? used to walk on treadmill for 5mn, but stopped due to knee pain.  Outside walking can walk 30 min.   ? Diagnostic tests 03/05/20- xray L knee FINDINGS:  No evidence of fracture, dislocation, or joint effusion. No evidence  of arthropathy or other focal bone abnormality. Soft tissues are  unremarkable.   ? Patient Stated Goals "get rid of knee pain"   ? Currently in Pain? Yes   ? Pain Score 6    ? Pain Location Knee   ? Pain  Orientation Left;Right   ? Pain Onset More than a month ago   ? ?  ?  ? ?  ? ? ? ? ? ? ? ? ? ? ? ? ? ? ? ? ? ? ? ? [facility] PT Treatment/Exercise - 09/10/21 0001   ? ?  ? Exercises  ? Exercises Knee/Hip   ?  ? Knee/Hip Exercises: Stretches  ? Passive Hamstring Stretch Both;3 reps;30 seconds   ? Passive Hamstring Stretch Limitations with strap, assisted by PT, cues to DF foot to stretch gastroc as well.   ?  ? Knee/Hip Exercises: Aerobic  ? Nustep L5 x 6 min   ?  ? Knee/Hip Exercises: Supine  ? Bridges Strengthening;20 reps   ? Bridges with Clamshell Strengthening;Both;20 reps;Limitations   with RTB keeping hips abducted but not clamshell, cues for abdominal bracing to decrease LB discomfort  ? Straight Leg Raises Strengthening;Both;2 sets;10 reps   ? Straight Leg Raises Limitations cues to maintain quad set and full knee extension   ? Knee Extension Strengthening;Both;15 reps   ? Knee Extension Limitations GTB   ? Other Supine Knee/Hip Exercises supine clams RTB x 20   ?  ? Manual Therapy  ? Manual Therapy Soft tissue mobilization;Joint mobilization   ? Manual therapy comments to decrease bil knee pain   ? Joint Mobilization patellar glides L knee inf/sup, noted popping with inf glides   ? Soft tissue mobilization IASTM with s/s tools to bil quads, adductors, R hamstring to decrease muscle tightness and pain   ? ?  ?  ? ?  ? ? ? ? ? ? ? ? ? ? ? ? PT Short Term Goals - 09/03/21 1321   ? ?  ? PT SHORT TERM GOAL #1  ? Title Pt. will be compliant with initial HEP.   ? Time 2   ? Period Weeks   ? Status On-going   ? Target Date 09/10/21   ? ?  ?  ? ?  ? ? ? ? PT Long Term Goals - 09/03/21 1321   ? ?  ? PT LONG TERM GOAL #1  ? Title Pt. will be independent with progressed HEP to improve outcomes and carryover   ? Time 6   ? Period Weeks   ? Status On-going   ? Target Date 10/08/21   ?  ? PT LONG TERM GOAL #2  ? Title Pt. will demonstrate 5/5 L quad strength without pain for safety with gait.   ?  Baseline noted 10 deg L  quad lag and atrophy, fearful of L knee buckling   ? Time 6   ? Period Weeks   ? Status On-going   ? Target Date 10/08/21   ?  ? PT LONG TERM GOAL #3  ? Title Pt. will score at least 62% on FOTO to demonstrate improved functional mobility.   ? Baseline 62%   ? Time 6   ? Period Weeks   ? Status On-going   ? Target Date 10/08/21   ?  ? PT LONG TERM GOAL #4  ? Title Pt. will be able to descend 1 flight of stairs without increased knee pain to access home.   ? Time 6   ? Period Weeks   ? Status On-going   ? Target Date 10/08/21   ?  ? PT LONG TERM GOAL #5  ? Title Pt. will be able to stand for 15 minutes without increased bil knee pain to complete home ADLs.   ? Baseline leans against counter while washing dishes.   ? Time 6   ? Period Weeks   ? Status On-going   ? Target Date 10/08/21   ? ?  ?  ? ?  ? ? ? ? ? ? ? ? Plan - 09/10/21 1758   ? ? Clinical Impression Statement Pt reports more pain in posterior knee on R, and anterior knee on L after trip.  Continued with knee/hip strengthening tolerated well with exception of R knee extensions with theraband due to posterior knee pain.  After manual therapy to posterior knee and gentle HS stretches reported decreased pain.  Pt. would benefit from continued skilled therapy.   ? Personal Factors and Comorbidities Age;Comorbidity 3+   ? Comorbidities HTN, cardiomyopathy, Rt bundle block, T2DM, history of DCIS (bil with lumpectomy and radiation R).   ? Examination-Activity Limitations Bed Mobility;Carry;Locomotion Level;Squat;Transfers;Stand;Stairs;Bend   ? Examination-Participation Restrictions Cleaning;Meal Prep;Community Activity;Laundry;Shop;Other   caring for grand-daughter  ? Stability/Clinical Decision Making Evolving/Moderate complexity   ? Rehab Potential Good   ? PT Frequency Other (comment)   1-2x/week.  Pt. prefers 1x/week due to committment watching granddaughter.  ? PT Duration 6 weeks   ? PT Treatment/Interventions ADLs/Self Care Home  Management;Cryotherapy;Electrical Stimulation;Iontophoresis '4mg'$ /ml Dexamethasone;Gait training;Stair training;Functional mobility training;Therapeutic activities;Balance training;Therapeutic exercise;Neuromuscular re-education;Patient/

## 2021-09-30 ENCOUNTER — Other Ambulatory Visit: Payer: Self-pay | Admitting: Family

## 2021-09-30 ENCOUNTER — Other Ambulatory Visit: Payer: Self-pay | Admitting: Family Medicine

## 2021-09-30 ENCOUNTER — Other Ambulatory Visit (HOSPITAL_BASED_OUTPATIENT_CLINIC_OR_DEPARTMENT_OTHER): Payer: Self-pay

## 2021-09-30 DIAGNOSIS — J069 Acute upper respiratory infection, unspecified: Secondary | ICD-10-CM

## 2021-09-30 MED ORDER — BISOPROLOL-HYDROCHLOROTHIAZIDE 5-6.25 MG PO TABS
1.0000 | ORAL_TABLET | Freq: Every day | ORAL | 1 refills | Status: DC
  Filled 2021-09-30: qty 90, 90d supply, fill #0
  Filled 2021-12-24: qty 90, 90d supply, fill #1

## 2021-09-30 MED ORDER — FLUTICASONE PROPIONATE 50 MCG/ACT NA SUSP
2.0000 | Freq: Every day | NASAL | 5 refills | Status: DC
  Filled 2021-09-30: qty 16, 30d supply, fill #0
  Filled 2021-12-24: qty 16, 30d supply, fill #1
  Filled 2022-02-02: qty 16, 30d supply, fill #2

## 2021-10-06 ENCOUNTER — Ambulatory Visit: Payer: HMO | Attending: Family Medicine

## 2021-10-06 DIAGNOSIS — M25562 Pain in left knee: Secondary | ICD-10-CM | POA: Diagnosis not present

## 2021-10-06 DIAGNOSIS — G8929 Other chronic pain: Secondary | ICD-10-CM | POA: Diagnosis not present

## 2021-10-06 DIAGNOSIS — M25561 Pain in right knee: Secondary | ICD-10-CM | POA: Insufficient documentation

## 2021-10-06 DIAGNOSIS — M6281 Muscle weakness (generalized): Secondary | ICD-10-CM | POA: Diagnosis not present

## 2021-10-06 NOTE — Therapy (Addendum)
PHYSICAL THERAPY DISCHARGE SUMMARY  Visits from Start of Care: 4  Current functional level related to goals / functional outcomes: Decreased L knee pain, FOTO improved to 73%, improved L knee strength, met 4/5 LTG   Remaining deficits: L knee pain descending stairs   Education / Equipment: HEP  Plan: Patient agrees to discharge.   Patient is being discharged due to meeting the stated rehab goals.  Patient requested to be placed on hold on 10/06/2021 due to progress and independence with HEP, has not returned within stated period.   Rennie Natter, PT, DPT 3:32 PM 11/16/2021  [facility] [address]  [place], [place], [address] Phone: [phone]   Fax:  [phone]  Physical Therapy Treatment/Progress Note  Progress Note Reporting Period 08/27/2021 to 10/06/2021  See note below for Objective Data and Assessment of Progress/Goals.  Patient being placed on 30 day hold based on progress towards goals.    Rennie Natter, PT, DPT     Patient Details  Name: DARIONA POSTMA MRN: [id number] Date of Birth: 16-Aug-1950 Referring Provider (PT): Rosemarie Ax   Encounter Date: 10/06/2021   PT End of Session - 10/06/21 1801     Visit Number 4    Number of Visits 12    Date for PT Re-Evaluation 10/08/21    Authorization Type [employer] - follow MCR guidelines    Progress Note Due on Visit 10    PT Start Time 1703    PT Stop Time 9532    PT Time Calculation (min) 35 min    Activity Tolerance Patient tolerated treatment well    Behavior During Therapy [facility] for tasks assessed/performed             Past Medical History:  Diagnosis Date   Acute bronchitis 10/31/2015   Allergy    seasonal   Arthritis    knee-left   Cancer ([place]) 04/2018   right breast ca   Cataract    right eye   Diabetes mellitus 52   type 2   Diaphragmatic hernia without mention of obstruction or gangrene     Endometriosis    Esophageal reflux    Esophageal stricture    GERD (gastroesophageal reflux disease)    HOCM (hypertrophic obstructive cardiomyopathy) ([facility])    Basal septal hypertrophy 16 mm with increased ECV at 30% on cardiac MRI 06/2021 consistent with HOCM   Hyperlipidemia    Hyperplastic colon polyp    Hypertension 52   Nonalcoholic fatty liver disease 04/28/2010   Qualifier: Diagnosis of  By: Arnoldo Morale MD, John E    Nonspecific abnormal results of liver function study    NSVT (nonsustained ventricular tachycardia) ([place])    noted on event monitor 05/2021   Personal history of radiation therapy 08/2018   bilateral breasts   Pharyngitis 02/18/2012   Plantar fascial fibromatosis    Preventative health care 01/22/2013   Seasonal allergies 10/31/2015   Sleep apnea    has oral appliance but does not fit well due to missing teeth   Sore throat 04/03/2017   Tubular adenoma of colon    Unspecified sleep apnea    no c-pap    Past Surgical History:  Procedure Laterality Date   ABDOMINAL HYSTERECTOMY  1982   partial   APPENDECTOMY     benigh cyst in lung  2006   right   BREAST BIOPSY Bilateral 04/03/2018   BREAST BIOPSY Bilateral 03/23/2018  BREAST CYST INCISION AND DRAINAGE     right breast   BREAST EXCISIONAL BIOPSY Right 2019   BREAST LUMPECTOMY Bilateral 04/2018   BREAST LUMPECTOMY WITH RADIOACTIVE SEED LOCALIZATION Bilateral 05/02/2018   Procedure: RIGHT BREAST SEED LUMPECTOMY X2 AND LEFT BREAST SEED GUIDED LUMPECTOMY;  Surgeon: [name], MD;  Location: [facility];  Service: General;  Laterality: Bilateral;   CATARACT EXTRACTION     right eye   HERNIA REPAIR     per pt, she is not aware of this surgery   RE-EXCISION OF BREAST LUMPECTOMY Right 05/25/2018   Procedure: RE-EXCISION OF RIGHT BREAST LUMPECTOMY;  Surgeon: [name], MD;  Location: [facility];  Service: General;  Laterality: Right;   seba     TUBAL LIGATION      There  were no vitals filed for this visit.   Subjective Assessment - 10/06/21 1708     Subjective Pt reports that she has been doing very well for the past two weeks.    Diagnostic tests 03/05/20- xray L knee FINDINGS:  No evidence of fracture, dislocation, or joint effusion. No evidence  of arthropathy or other focal bone abnormality. Soft tissues are  unremarkable.    Patient Stated Goals "get rid of knee pain"    Currently in Pain? No/denies                [facility] PT Assessment - 10/06/21 0001       Assessment   Medical Diagnosis M17.0 (ICD-10-CM) - Primary osteoarthritis of both knees    Referring Provider (PT) Rosemarie Ax      Observation/Other Assessments   Focus on Therapeutic Outcomes (FOTO)  knee:73%.  Predicted outcome 62% after 11 visits      Strength   Left Knee Extension 5/5                           [facility] Adult PT Treatment/Exercise - 10/06/21 0001       Ambulation/Gait   Stairs Yes    Stairs Assistance 7: Independent    Stair Management Technique One rail Right;Alternating pattern    Number of Stairs 13    Height of Stairs 7      Exercises   Exercises Knee/Hip      Knee/Hip Exercises: Aerobic   Nustep L5 x 6 min      Knee/Hip Exercises: Supine   Bridges Strengthening;Both;10 reps    Bridges Limitations red TB    Other Supine Knee/Hip Exercises supine clams RTB x 15 rep with 3 sec pause      Knee/Hip Exercises: Sidelying   Clams RTB L LE x 10                       PT Short Term Goals - 10/06/21 1811       PT SHORT TERM GOAL #1   Title Pt. will be compliant with initial HEP.    Time 2    Period Weeks    Status Achieved   10/06/21   Target Date 09/10/21               PT Long Term Goals - 10/06/21 1711       PT LONG TERM GOAL #1   Title Pt. will be independent with progressed HEP to improve outcomes and carryover    Time 6    Period Weeks    Status Achieved  10/06/21   Target Date 10/08/21      PT  LONG TERM GOAL #2   Title Pt. will demonstrate 5/5 L quad strength without pain for safety with gait.    Baseline noted 10 deg L quad lag and atrophy, fearful of L knee buckling    Time 6    Period Weeks    Status Achieved   10/06/21   Target Date 10/08/21      PT LONG TERM GOAL #3   Title Pt. will score at least 62% on FOTO to demonstrate improved functional mobility.    Baseline 62%    Time 6    Period Weeks    Status Achieved   10/06/21   Target Date 10/08/21      PT LONG TERM GOAL #4   Title Pt. will be able to descend 1 flight of stairs without increased knee pain to access home.    Time 6    Period Weeks    Status Partially Met   10/06/21- 2/10 L knee pain with descending stairs   Target Date 10/08/21      PT LONG TERM GOAL #5   Title Pt. will be able to stand for 15 minutes without increased bil knee pain to complete home ADLs.    Baseline leans against counter while washing dishes.    Time 6    Period Weeks    Status Achieved   10/06/21   Target Date 10/08/21                   Plan - 10/06/21 1804     Clinical Impression Statement Pt presents today with L quad strength 5/5 with MMT. Her standing tolerance has improved to at least 15 min w/o knee pain. She is able to navigate stairs with reciprocal pattern but did note 2/10 L knee pain descending stairs. FOTO score has exceeded FDS. She reported no instances of increased L knee pain over the past few weeks. At this point she has met all LTGs except LTG #4 which is partially met. Updated HEP and progressed, provided instruction on new exercises to alternate days to avoid tendinitis. Pt reports no functional limitations at this time, please with progress, she will be placed on 30 day hold at this time.    Personal Factors and Comorbidities Age;Comorbidity 3+    Comorbidities HTN, cardiomyopathy, Rt bundle block, T2DM, history of DCIS (bil with lumpectomy and radiation R).    PT Frequency Other (comment)   1-2x per week    PT Duration 6 weeks    PT Treatment/Interventions ADLs/Self Care Home Management;Cryotherapy;Electrical Stimulation;Iontophoresis 4mg /ml Dexamethasone;Gait training;Stair training;Functional mobility training;Therapeutic activities;Balance training;Therapeutic exercise;Neuromuscular re-education;Patient/family education;Manual techniques;Passive range of motion;Dry needling;Taping;Joint Manipulations    PT Next Visit Plan 30 day hold    PT Home Exercise Plan Access Code: [email or web address]    Consulted and Agree with Plan of Care Patient             Patient will benefit from skilled therapeutic intervention in order to improve the following deficits and impairments:  Decreased activity tolerance, Decreased strength, Increased fascial restricitons, Pain, Decreased balance, Decreased mobility, Difficulty walking, Increased muscle spasms  Visit Diagnosis: Chronic pain of right knee  Acute pain of left knee  Muscle weakness (generalized)     Problem List Patient Active Problem List   Diagnosis Date Noted   HOCM (hypertrophic obstructive cardiomyopathy) ([place]) 06/26/2021   Viral upper respiratory tract infection 04/06/2021   Heart palpitations 02/01/2021  Elevated lipase 02/01/2021   Need for pneumococcal vaccination 02/01/2021   Diabetes mellitus without complication ([place]) 14/43/9265   Chest wall asymmetry 12/05/2019   Insomnia 07/23/2019   Shingles 02/18/2019   Ductal carcinoma in situ (DCIS) of left breast 07/03/2018   Vertigo 03/13/2018   Ductal carcinoma in situ (DCIS) of right breast 03/10/2018   Bilateral calf pain 10/03/2017   Right shoulder pain 10/03/2017   Headache 06/30/2017   Obesity (BMI 30-39.9) 05/02/2017   Seasonal allergies 10/31/2015   Hx of adenomatous colonic polyps 04/30/2014   Cervical cancer screening 01/22/2013   Preventative health care 01/22/2013   Chest pain 01/06/2012   Endometriosis    Chronic cough 99/78/7765   Nonalcoholic fatty liver disease  04/28/2010   OSA (obstructive sleep apnea) 03/26/2009   RT BUNDLE BRANCH BLOCK&LT ANT FASCICULAR BLOCK 12/20/2008   Thoracic back pain 10/10/2008   Osteoarthritis 07/05/2008   Abdominal pain 07/05/2008   Aneurysm, thoracoabdominal ([place]) 03/22/2007   Type 2 diabetes mellitus with hyperglycemia, without long-term current use of insulin ([place]) 10/28/2006   Hyperlipidemia, mixed 10/28/2006   Depression with anxiety 10/28/2006   Essential hypertension 10/28/2006   ESOPHAGEAL STRICTURE 05/04/2004   HIATAL HERNIA 05/04/2004    Artist Pais, PTA 10/06/2021, [facility] [address]  [place], [place], [address] Phone: [phone]   Fax:  [phone]  Name: TAKIYAH BOHNSACK MRN: [id number] Date of Birth: 1951-01-24

## 2021-10-12 NOTE — Progress Notes (Signed)
? ?Subjective:  ? [name] is a 71 y.o. female who presents for an Initial Medicare Annual Wellness Visit. ? ?I connected with  [name] on 10/13/21 by a audio enabled telemedicine application and verified that I am speaking with the correct person using two identifiers. ? ?Patient Location: Home ? ?Provider Location: Office/Clinic ? ?I discussed the limitations of evaluation and management by telemedicine. The patient expressed understanding and agreed to proceed.  ? ?Review of Systems    ? ?Cardiac Risk Factors include: advanced age (>68mn, >>51women);diabetes mellitus;hypertension;dyslipidemia ? ?   ?Objective:  ?  ?There were no vitals filed for this visit. ?There is no height or weight on file to calculate BMI. ? ? ?  10/13/2021  ?  9:42 AM 08/27/2021  ?  4:43 PM 01/15/2021  ?  4:20 PM 01/09/2020  ?  1:51 PM 01/02/2020  ?  2:35 PM 06/22/2018  ?  1:00 PM 05/17/2018  ? 10:36 AM  ?Advanced Directives  ?Does Patient Have a Medical Advance Directive? Yes Yes No Yes No Yes Yes  ?Type of [place] (pink MOST or yellow form);Living will [place]  [place]  [place] Living will  ?Does patient want to make changes to medical advance directive?  No - Patient declined  No - Patient declined   No - Patient declined  ?Copy of [place] Chart? No - copy requested No - copy requested     No - copy requested  ?Would patient like information on creating a medical advance directive?   No - Patient declined      ? ? ?Current Medications (verified) ?Outpatient Encounter Medications as of 10/13/2021  ?Medication Sig  ? albuterol (VENTOLIN HFA) 108 (90 Base) MCG/ACT inhaler Inhale 2 puffs by mouth into the lungs every 6 (six) hours as needed for wheezing or shortness of breath.  ? ASPIR-LOW 81 MG EC tablet TAKE 1 TABLET BY MOUTH DAILY  ?  bisoprolol-hydrochlorothiazide (ZIAC) 5-6.25 MG tablet Take 1 tablet by mouth daily.  ? fluticasone (FLONASE) 50 MCG/ACT nasal spray Place 2 sprays into both nostrils daily.  ? glipiZIDE (GLUCOTROL) 5 MG tablet Take 1 tablet (5 mg total) by mouth daily before breakfast and 1 tablet (5 mg total) daily before supper.  ? glucose blood (ONETOUCH VERIO) test strip USE AS INSTRUCTED TWICE A DAY  ? glucose blood test strip OneTouch Verio test strips  ? Magnesium Oxide 400 MG CAPS Take 1 capsule (400 mg total) by mouth daily.  ? MELATONIN PO Take 10 mg by mouth at bedtime.  ? metFORMIN (GLUCOPHAGE) 1000 MG tablet Take 1 tablet (1,000 mg total) by mouth 2 (two) times daily with a meal.  ? montelukast (SINGULAIR) 10 MG tablet Take 1 tablet (10 mg total) by mouth at bedtime as needed.  ? omeprazole (PRILOSEC) 40 MG capsule TAKE 1 CAPSULE BY MOUTH TWICE DAILY  ? rosuvastatin (CRESTOR) 5 MG tablet Take 1 tablet (5 mg total) by mouth daily.  ? Semaglutide (RYBELSUS) 7 MG TABS Take 1 tablet by mouth daily.  ? traZODone (DESYREL) 50 MG tablet Take 1/2 - 1 tablet (25-50 mg total) by mouth at bedtime as needed for sleep.  ? ?Facility-Administered Encounter Medications as of 10/13/2021  ?Medication  ? Chlorhexidine Gluconate Cloth 2 % PADS 6 each  ? And  ? Chlorhexidine Gluconate Cloth 2 % PADS 6  each  ? ? ?Allergies (verified) ?Pravastatin, Amoxicillin, Atorvastatin, and Codeine  ? ?History: ?Past Medical History:  ?Diagnosis Date  ? Acute bronchitis 10/31/2015  ? Allergy   ? seasonal  ? Arthritis   ? knee-left  ? Cancer ([place]) 04/2018  ? right breast ca  ? Cataract   ? right eye  ? Diabetes mellitus 52  ? type 2  ? Diaphragmatic hernia without mention of obstruction or gangrene   ? Endometriosis   ? Esophageal reflux   ? Esophageal stricture   ? GERD (gastroesophageal reflux disease)   ? HOCM (hypertrophic obstructive cardiomyopathy) ([place])   ? Basal septal hypertrophy 16 mm with increased ECV at 30% on cardiac MRI 06/2021 consistent  with HOCM  ? Hyperlipidemia   ? Hyperplastic colon polyp   ? Hypertension 52  ? Nonalcoholic fatty liver disease 04/28/2010  ? Qualifier: Diagnosis of  By: Arnoldo Morale MD, Balinda Quails   ? Nonspecific abnormal results of liver function study   ? NSVT (nonsustained ventricular tachycardia) ([place])   ? noted on event monitor 05/2021  ? Personal history of radiation therapy 08/2018  ? bilateral breasts  ? Pharyngitis 02/18/2012  ? Plantar fascial fibromatosis   ? Preventative health care 01/22/2013  ? Seasonal allergies 10/31/2015  ? Sleep apnea   ? has oral appliance but does not fit well due to missing teeth  ? Sore throat 04/03/2017  ? Tubular adenoma of colon   ? Unspecified sleep apnea   ? no c-pap  ? ?Past Surgical History:  ?Procedure Laterality Date  ? ABDOMINAL HYSTERECTOMY  1982  ? partial  ? APPENDECTOMY    ? benigh cyst in lung  2006  ? right  ? BREAST BIOPSY Bilateral 04/03/2018  ? BREAST BIOPSY Bilateral 03/23/2018  ? BREAST CYST INCISION AND DRAINAGE    ? right breast  ? BREAST EXCISIONAL BIOPSY Right 2019  ? BREAST LUMPECTOMY Bilateral 04/2018  ? BREAST LUMPECTOMY WITH RADIOACTIVE SEED LOCALIZATION Bilateral 05/02/2018  ? Procedure: RIGHT BREAST SEED LUMPECTOMY X2 AND LEFT BREAST SEED GUIDED LUMPECTOMY;  Surgeon: [name], MD;  Location: [place];  Service: General;  Laterality: Bilateral;  ? CATARACT EXTRACTION    ? right eye  ? HERNIA REPAIR    ? per pt, she is not aware of this surgery  ? RE-EXCISION OF BREAST LUMPECTOMY Right 05/25/2018  ? Procedure: RE-EXCISION OF RIGHT BREAST LUMPECTOMY;  Surgeon: [name], MD;  Location: [facility];  Service: General;  Laterality: Right;  ? seba    ? TUBAL LIGATION    ? ?Family History  ?Problem Relation Age of Onset  ? Breast cancer Mother   ? Hypertension Mother   ? Diabetes Mother   ?     type 2  ? Other Mother   ?     esophagus surgery  ? Heart disease Father   ? Hyperlipidemia Father   ? Hypertension Father   ? Heart attack  Father   ?     MI at age 46  ? Diabetes Brother   ?     type 2  ? Heart disease Maternal Grandmother   ? Heart attack Maternal Grandfather   ? Hypertension Maternal Grandfather   ? Stroke Paternal Grandmother   ? Hypertension Sister   ? Diabetes Sister   ? Hypertension Brother   ? Alcohol abuse Maternal Aunt   ? Cancer Maternal Aunt   ? Colon cancer Neg Hx   ? Stomach cancer  Neg Hx   ? Esophageal cancer Neg Hx   ? ?Social History  ? ?Socioeconomic History  ? Marital status: Married  ?  Spouse name: [name]  ? Number of children: 2  ? Years of education: Not on file  ? Highest education level: Not on file  ?Occupational History  ? Occupation: Designer, television/film set  ?  Employer: [employer]  ?Tobacco Use  ? Smoking status: Never  ? Smokeless tobacco: Never  ?Substance and Sexual Activity  ? Alcohol use: No  ?  Alcohol/week: 0.0 standard drinks  ? Drug use: No  ? Sexual activity: Yes  ?  Birth control/protection: Post-menopausal  ?  Comment: lives with husband, no dietary restrictions just watching carbs, wears seat belt  ?Other Topics Concern  ? Not on file  ?Social History Narrative  ? Not on file  ? ?Social Determinants of Health  ? ?Financial Resource Strain: Low Risk   ? Difficulty of Paying Living Expenses: Not hard at all  ?Food Insecurity: No Food Insecurity  ? Worried About Charity fundraiser in the Last Year: Never true  ? Ran Out of Food in the Last Year: Never true  ?Transportation Needs: No Transportation Needs  ? Lack of Transportation (Medical): No  ? Lack of Transportation (Non-Medical): No  ?Physical Activity: Not on file  ?Stress: No Stress Concern Present  ? Feeling of Stress : Not at all  ?Social Connections: Socially Integrated  ? Frequency of Communication with Friends and Family: More than three times a week  ? Frequency of Social Gatherings with Friends and Family: More than three times a week  ? Attends Religious Services: More than 4 times per year  ? Active Member of Clubs or Organizations: Yes  ?  Attends Archivist Meetings: More than 4 times per year  ? Marital Status: Married  ? ? ?Tobacco Counseling ?Counseling given: Not Answered ? ? ?Clinical Intake: ? ?Pre-visit preparation completed: Yes ? ?Pain : No

## 2021-10-13 ENCOUNTER — Ambulatory Visit (INDEPENDENT_AMBULATORY_CARE_PROVIDER_SITE_OTHER): Payer: HMO

## 2021-10-13 DIAGNOSIS — Z0000 Encounter for general adult medical examination without abnormal findings: Secondary | ICD-10-CM | POA: Diagnosis not present

## 2021-10-13 NOTE — Patient Instructions (Signed)
Ms. Cherry , ?Thank you for taking time to come for your Medicare Wellness Visit. I appreciate your ongoing commitment to your health goals. Please review the following plan we discussed and let me know if I can assist you in the future.  ? ?Screening recommendations/referrals: ?Colonoscopy: 12/23/20 due 12/24/23 ?Mammogram: 03/04/21 due 03/04/22 ?Bone Density: 07/07/21 due 07/08/23 ?Recommended yearly ophthalmology/optometry visit for glaucoma screening and checkup ?Recommended yearly dental visit for hygiene and checkup ? ?Vaccinations: ?Influenza vaccine: Due-May obtain vaccine at your local pharmacy.  ?Pneumococcal vaccine: up to date ?Tdap vaccine: up to date ?Shingles vaccine: Due-May obtain vaccine at your local pharmacy.    ?Covid-19:Due-May obtain vaccine at  your local pharmacy.  ? ?Advanced directives: yes, not on file ? ?Conditions/risks identified: see problem list  ? ?Next appointment: Follow up in one year for your annual wellness visit 10/15/22 ? ? ?Preventive Care 69 Years and Older, Female ?Preventive care refers to lifestyle choices and visits with your health care provider that can promote health and wellness. ?What does preventive care include? ?A yearly physical exam. This is also called an annual well check. ?Dental exams once or twice a year. ?Routine eye exams. Ask your health care provider how often you should have your eyes checked. ?Personal lifestyle choices, including: ?Daily care of your teeth and gums. ?Regular physical activity. ?Eating a healthy diet. ?Avoiding tobacco and drug use. ?Limiting alcohol use. ?Practicing safe sex. ?Taking low-dose aspirin every day. ?Taking vitamin and mineral supplements as recommended by your health care provider. ?What happens during an annual well check? ?The services and screenings done by your health care provider during your annual well check will depend on your age, overall health, lifestyle risk factors, and family history of disease. ?Counseling   ?Your health care provider may ask you questions about your: ?Alcohol use. ?Tobacco use. ?Drug use. ?Emotional well-being. ?Home and relationship well-being. ?Sexual activity. ?Eating habits. ?History of falls. ?Memory and ability to understand (cognition). ?Work and work Statistician. ?Reproductive health. ?Screening  ?You may have the following tests or measurements: ?Height, weight, and BMI. ?Blood pressure. ?Lipid and cholesterol levels. These may be checked every 5 years, or more frequently if you are over 60 years old. ?Skin check. ?Lung cancer screening. You may have this screening every year starting at age 25 if you have a 30-pack-year history of smoking and currently smoke or have quit within the past 15 years. ?Fecal occult blood test (FOBT) of the stool. You may have this test every year starting at age 60. ?Flexible sigmoidoscopy or colonoscopy. You may have a sigmoidoscopy every 5 years or a colonoscopy every 10 years starting at age 10. ?Hepatitis C blood test. ?Hepatitis B blood test. ?Sexually transmitted disease (STD) testing. ?Diabetes screening. This is done by checking your blood sugar (glucose) after you have not eaten for a while (fasting). You may have this done every 1-3 years. ?Bone density scan. This is done to screen for osteoporosis. You may have this done starting at age 46. ?Mammogram. This may be done every 1-2 years. Talk to your health care provider about how often you should have regular mammograms. ?Talk with your health care provider about your test results, treatment options, and if necessary, the need for more tests. ?Vaccines  ?Your health care provider may recommend certain vaccines, such as: ?Influenza vaccine. This is recommended every year. ?Tetanus, diphtheria, and acellular pertussis (Tdap, Td) vaccine. You may need a Td booster every 10 years. ?Zoster vaccine. You may need this  after age 43. ?Pneumococcal 13-valent conjugate (PCV13) vaccine. One dose is recommended  after age 41. ?Pneumococcal polysaccharide (PPSV23) vaccine. One dose is recommended after age 25. ?Talk to your health care provider about which screenings and vaccines you need and how often you need them. ?This information is not intended to replace advice given to you by your health care provider. Make sure you discuss any questions you have with your health care provider. ?Document Released: 06/27/2015 Document Revised: 02/18/2016 Document Reviewed: 04/01/2015 ?Elsevier Interactive Patient Education ? 2017 [employer]. ? ?Fall Prevention in the Home ?Falls can cause injuries. They can happen to people of all ages. There are many things you can do to make your home safe and to help prevent falls. ?What can I do on the outside of my home? ?Regularly fix the edges of walkways and driveways and fix any cracks. ?Remove anything that might make you trip as you walk through a door, such as a raised step or threshold. ?Trim any bushes or trees on the path to your home. ?Use bright outdoor lighting. ?Clear any walking paths of anything that might make someone trip, such as rocks or tools. ?Regularly check to see if handrails are loose or broken. Make sure that both sides of any steps have handrails. ?Any raised decks and porches should have guardrails on the edges. ?Have any leaves, snow, or ice cleared regularly. ?Use sand or salt on walking paths during winter. ?Clean up any spills in your garage right away. This includes oil or grease spills. ?What can I do in the bathroom? ?Use night lights. ?Install grab bars by the toilet and in the tub and shower. Do not use towel bars as grab bars. ?Use non-skid mats or decals in the tub or shower. ?If you need to sit down in the shower, use a plastic, non-slip stool. ?Keep the floor dry. Clean up any water that spills on the floor as soon as it happens. ?Remove soap buildup in the tub or shower regularly. ?Attach bath mats securely with double-sided non-slip rug tape. ?Do not  have throw rugs and other things on the floor that can make you trip. ?What can I do in the bedroom? ?Use night lights. ?Make sure that you have a light by your bed that is easy to reach. ?Do not use any sheets or blankets that are too big for your bed. They should not hang down onto the floor. ?Have a firm chair that has side arms. You can use this for support while you get dressed. ?Do not have throw rugs and other things on the floor that can make you trip. ?What can I do in the kitchen? ?Clean up any spills right away. ?Avoid walking on wet floors. ?Keep items that you use a lot in easy-to-reach places. ?If you need to reach something above you, use a strong step stool that has a grab bar. ?Keep electrical cords out of the way. ?Do not use floor polish or wax that makes floors slippery. If you must use wax, use non-skid floor wax. ?Do not have throw rugs and other things on the floor that can make you trip. ?What can I do with my stairs? ?Do not leave any items on the stairs. ?Make sure that there are handrails on both sides of the stairs and use them. Fix handrails that are broken or loose. Make sure that handrails are as long as the stairways. ?Check any carpeting to make sure that it is firmly attached to the  stairs. Fix any carpet that is loose or worn. ?Avoid having throw rugs at the top or bottom of the stairs. If you do have throw rugs, attach them to the floor with carpet tape. ?Make sure that you have a light switch at the top of the stairs and the bottom of the stairs. If you do not have them, ask someone to add them for you. ?What else can I do to help prevent falls? ?Wear shoes that: ?Do not have high heels. ?Have rubber bottoms. ?Are comfortable and fit you well. ?Are closed at the toe. Do not wear sandals. ?If you use a stepladder: ?Make sure that it is fully opened. Do not climb a closed stepladder. ?Make sure that both sides of the stepladder are locked into place. ?Ask someone to hold it for  you, if possible. ?Clearly mark and make sure that you can see: ?Any grab bars or handrails. ?First and last steps. ?Where the edge of each step is. ?Use tools that help you move around (mobility aids) if they ar

## 2021-10-21 NOTE — Progress Notes (Addendum)
?Electrophysiology Office Note:   ? ?Date:  10/22/2021  ? ?ID:  [name], DOB 1951-03-07, MRN [id number] ? ?PCP:  Mosie Lukes, MD  ?[facility] Cardiologist:  Fransico Him, MD  ?[facility] Electrophysiologist:  Vickie Epley, MD  ? ?Referring MD: Sueanne Margarita, MD  ? ?Chief Complaint: NSVT, HOCM ? ?History of Present Illness:   ? ?[name] is a 71 y.o. female who presents for an evaluation of NSVT and HOCM at the request of Dr. Radford Pax. Their medical history includes HOCM, NSVT, hypertension, hyperlipidemia, type 2 diabetes mellitus, GERD, right breast cancer s/p radiation therapy, bronchitis, arthritis, and sleep apnea. ? ?She saw Dr. Radford Pax 04/14/2021 for evaluation of palpitations. After she was started on supplements for a low magnesium at 1.6, her palpitations resolved and she stopped the magnesium. Her palpitations returned and she restarted the magnesium. She noticed palpitations throughout the day. A 30-day event monitor was ordered to rule out PAF. She had recently been started on Albuterol for asthma; recommended to change to Xopenex due to palpitations. ? ?Results of her heart monitor showed nonsustained ventricular tachycardia. She had a cardiac stress MRI 06/25/2021, notable for asymmetric LV hypertrophy measuring up to 33m in basal septum ?(955min posterior wall), meeting criteria for hypertrophic ?cardiomyopathy. Given her NSVT and cMRI findings concerning for HOCM she was referred to EP for evaluation. ? ?She is accompanied by a family member via telephone. Today, she states she is feeling okay. We reviewed her monitor and MRI results at length. ? ?Typically she monitors her blood pressure at home. ? ?Of note, she states that her arrhythmia seemed to develop subsequent to her prior surgery in 2004, regarding a cyst between her heart and lung. ? ?In her family, her father had CHF, CAD with blockages at 6919o. She denies any known sudden cardiac deaths in her family. Her  maternal grandmother died of a stroke at 6369o.  ? ?She denies any chest pain, shortness of breath, or peripheral edema. No lightheadedness, headaches, syncope, orthopnea, or PND. ? ? ?  ?Past Medical History:  ?Diagnosis Date  ? Acute bronchitis 10/31/2015  ? Allergy   ? seasonal  ? Arthritis   ? knee-left  ? Cancer (HCEast [place]11/2019  ? right breast ca  ? Cataract   ? right eye  ? Diabetes mellitus 52  ? type 2  ? Diaphragmatic hernia without mention of obstruction or gangrene   ? Endometriosis   ? Esophageal reflux   ? Esophageal stricture   ? GERD (gastroesophageal reflux disease)   ? HOCM (hypertrophic obstructive cardiomyopathy) (HCClements  ? Basal septal hypertrophy 16 mm with increased ECV at 30% on cardiac MRI 06/2021 consistent with HOCM  ? Hyperlipidemia   ? Hyperplastic colon polyp   ? Hypertension 52  ? Nonalcoholic fatty liver disease 04/28/2010  ? Qualifier: Diagnosis of  By: JeArnoldo MoraleD, JoBalinda Quails ? Nonspecific abnormal results of liver function study   ? NSVT (nonsustained ventricular tachycardia) (HCV[place]  ? noted on event monitor 05/2021  ? Personal history of radiation therapy 08/2018  ? bilateral breasts  ? Pharyngitis 02/18/2012  ? Plantar fascial fibromatosis   ? Preventative health care 01/22/2013  ? Seasonal allergies 10/31/2015  ? Sleep apnea   ? has oral appliance but does not fit well due to missing teeth  ? Sore throat 04/03/2017  ? Tubular adenoma of colon   ? Unspecified sleep apnea   ? no  c-pap  ? ? ?Past Surgical History:  ?Procedure Laterality Date  ? ABDOMINAL HYSTERECTOMY  1982  ? partial  ? APPENDECTOMY    ? benigh cyst in lung  2006  ? right  ? BREAST BIOPSY Bilateral 04/03/2018  ? BREAST BIOPSY Bilateral 03/23/2018  ? BREAST CYST INCISION AND DRAINAGE    ? right breast  ? BREAST EXCISIONAL BIOPSY Right 2019  ? BREAST LUMPECTOMY Bilateral 04/2018  ? BREAST LUMPECTOMY WITH RADIOACTIVE SEED LOCALIZATION Bilateral 05/02/2018  ? Procedure: RIGHT BREAST SEED LUMPECTOMY X2 AND LEFT BREAST SEED  GUIDED LUMPECTOMY;  Surgeon: [name], MD;  Location: [facility];  Service: General;  Laterality: Bilateral;  ? CATARACT EXTRACTION    ? right eye  ? HERNIA REPAIR    ? per pt, she is not aware of this surgery  ? RE-EXCISION OF BREAST LUMPECTOMY Right 05/25/2018  ? Procedure: RE-EXCISION OF RIGHT BREAST LUMPECTOMY;  Surgeon: [name], MD;  Location: [place];  Service: General;  Laterality: Right;  ? seba    ? TUBAL LIGATION    ? ? ?Current Medications: ?Current Meds  ?Medication Sig  ? albuterol (VENTOLIN HFA) 108 (90 Base) MCG/ACT inhaler Inhale 2 puffs by mouth into the lungs every 6 (six) hours as needed for wheezing or shortness of breath.  ? ASPIR-LOW 81 MG EC tablet TAKE 1 TABLET BY MOUTH DAILY  ? bisoprolol-hydrochlorothiazide (ZIAC) 5-6.25 MG tablet Take 1 tablet by mouth daily.  ? fluticasone (FLONASE) 50 MCG/ACT nasal spray Place 2 sprays into both nostrils daily.  ? glipiZIDE (GLUCOTROL) 5 MG tablet Take 1 tablet (5 mg total) by mouth daily before breakfast and 1 tablet (5 mg total) daily before supper.  ? glucose blood (ONETOUCH VERIO) test strip USE AS INSTRUCTED TWICE A DAY  ? glucose blood test strip OneTouch Verio test strips  ? Magnesium Oxide 400 MG CAPS Take 1 capsule (400 mg total) by mouth daily.  ? MELATONIN PO Take 10 mg by mouth at bedtime.  ? metFORMIN (GLUCOPHAGE) 1000 MG tablet Take 1 tablet (1,000 mg total) by mouth 2 (two) times daily with a meal.  ? montelukast (SINGULAIR) 10 MG tablet Take 1 tablet (10 mg total) by mouth at bedtime as needed.  ? omeprazole (PRILOSEC) 40 MG capsule TAKE 1 CAPSULE BY MOUTH TWICE DAILY  ? rosuvastatin (CRESTOR) 5 MG tablet Take 1 tablet (5 mg total) by mouth daily.  ? Semaglutide (RYBELSUS) 7 MG TABS Take 1 tablet by mouth daily.  ? traZODone (DESYREL) 50 MG tablet Take 1/2 - 1 tablet (25-50 mg total) by mouth at bedtime as needed for sleep.  ?  ? ?Allergies:   Pravastatin, Amoxicillin, Atorvastatin, and Codeine   ? ?Social History  ? ?Socioeconomic History  ? Marital status: Married  ?  Spouse name: [name]  ? Number of children: 2  ? Years of education: Not on file  ? Highest education level: Not on file  ?Occupational History  ? Occupation: Designer, television/film set  ?  Employer: [employer]  ?Tobacco Use  ? Smoking status: Never  ? Smokeless tobacco: Never  ?Substance and Sexual Activity  ? Alcohol use: No  ?  Alcohol/week: 0.0 standard drinks  ? Drug use: No  ? Sexual activity: Yes  ?  Birth control/protection: Post-menopausal  ?  Comment: lives with husband, no dietary restrictions just watching carbs, wears seat belt  ?Other Topics Concern  ? Not on file  ?Social History Narrative  ? Not on  file  ? ?Social Determinants of Health  ? ?Financial Resource Strain: Low Risk   ? Difficulty of Paying Living Expenses: Not hard at all  ?Food Insecurity: No Food Insecurity  ? Worried About Charity fundraiser in the Last Year: Never true  ? Ran Out of Food in the Last Year: Never true  ?Transportation Needs: No Transportation Needs  ? Lack of Transportation (Medical): No  ? Lack of Transportation (Non-Medical): No  ?Physical Activity: Not on file  ?Stress: No Stress Concern Present  ? Feeling of Stress : Not at all  ?Social Connections: Socially Integrated  ? Frequency of Communication with Friends and Family: More than three times a week  ? Frequency of Social Gatherings with Friends and Family: More than three times a week  ? Attends Religious Services: More than 4 times per year  ? Active Member of Clubs or Organizations: Yes  ? Attends Archivist Meetings: More than 4 times per year  ? Marital Status: Married  ?  ? ?Family History: ?The patient's family history includes Alcohol abuse in her maternal aunt; Breast cancer in her mother; Cancer in her maternal aunt; Diabetes in her brother, mother, and sister; Heart attack in her father and maternal grandfather; Heart disease in her father and maternal grandmother; Hyperlipidemia  in her father; Hypertension in her brother, father, maternal grandfather, mother, and sister; Other in her mother; Stroke in her paternal grandmother. There is no history of Colon cancer, Stomach cancer,

## 2021-10-22 ENCOUNTER — Ambulatory Visit (INDEPENDENT_AMBULATORY_CARE_PROVIDER_SITE_OTHER): Payer: HMO | Admitting: Cardiology

## 2021-10-22 ENCOUNTER — Encounter: Payer: Self-pay | Admitting: Cardiology

## 2021-10-22 VITALS — BP 122/74 | HR 76 | Ht 63.0 in | Wt 185.6 lb

## 2021-10-22 DIAGNOSIS — I517 Cardiomegaly: Secondary | ICD-10-CM

## 2021-10-22 DIAGNOSIS — I4510 Unspecified right bundle-branch block: Secondary | ICD-10-CM

## 2021-10-22 DIAGNOSIS — I10 Essential (primary) hypertension: Secondary | ICD-10-CM | POA: Diagnosis not present

## 2021-10-22 DIAGNOSIS — I4729 Other ventricular tachycardia: Secondary | ICD-10-CM | POA: Diagnosis not present

## 2021-10-22 DIAGNOSIS — I444 Left anterior fascicular block: Secondary | ICD-10-CM | POA: Diagnosis not present

## 2021-10-22 NOTE — Patient Instructions (Addendum)
Medication Instructions:  ?Your physician recommends that you continue on your current medications as directed. Please refer to the Current Medication list given to you today. ?*If you need a refill on your cardiac medications before your next appointment, please call your pharmacy* ? ?Lab Work: ?None. ?If you have labs (blood work) drawn today and your tests are completely normal, you will receive your results only by: ?MyChart Message (if you have MyChart) OR ?A paper copy in the mail ?If you have any lab test that is abnormal or we need to change your treatment, we will call you to review the results. ? ?Testing/Procedures: ?None. ? ?Follow-Up: ?At [facility], you and your health needs are our priority.  As part of our continuing mission to provide you with exceptional heart care, we have created designated Provider Care Teams.  These Care Teams include your primary Cardiologist (physician) and Advanced Practice Providers (APPs -  Physician Assistants and Nurse Practitioners) who all work together to provide you with the care you need, when you need it. ? ?Your physician wants you to follow-up in: As needed with Lars Mage, MD  ? ?We recommend signing up for the patient portal called "MyChart".  Sign up information is provided on this After Visit Summary.  MyChart is used to connect with patients for Virtual Visits (Telemedicine).  Patients are able to view lab/test results, encounter notes, upcoming appointments, etc.  Non-urgent messages can be sent to your provider as well.   ?To learn more about what you can do with MyChart, go to [email or web address].   ? ?Any Other Special Instructions Will Be Listed Below (If Applicable). ? ? ? ? ?  ? ? ?

## 2021-10-22 NOTE — Progress Notes (Signed)
?Electrophysiology Office Note:   ? ?Date:  10/22/2021  ? ?ID:  [name], DOB Oct 31, 1950, MRN [id number] ? ?PCP:  Mosie Lukes, MD  ?[facility] Cardiologist:  Fransico Him, MD  ?[facility] Electrophysiologist:  Vickie Epley, MD  ? ?Referring MD: Sueanne Margarita, MD  ? ?Chief Complaint: NSVT, HOCM ? ?History of Present Illness:   ? ?[name] is a 71 y.o. female who presents for an evaluation of NSVT and HOCM at the request of Dr. Radford Pax. Their medical history includes HOCM, NSVT, hypertension, hyperlipidemia, type 2 diabetes mellitus, GERD, right breast cancer s/p radiation therapy, bronchitis, arthritis, and sleep apnea. ? ?She saw Dr. Radford Pax 04/14/2021 for evaluation of palpitations. After she was started on supplements for a low magnesium at 1.6, her palpitations resolved and she stopped the magnesium. Her palpitations returned and she restarted the magnesium. She noticed palpitations throughout the day. A 30-day event monitor was ordered to rule out PAF. She had recently been started on Albuterol for asthma; recommended to change to Xopenex due to palpitations. ? ?Results of her heart monitor showed nonsustained ventricular tachycardia. She had a cardiac stress MRI 06/25/2021, notable for asymmetric LV hypertrophy measuring up to 31m in basal septum ?(958min posterior wall), meeting criteria for hypertrophic ?cardiomyopathy. Given her NSVT and cMRI findings concerning for HOCM she was referred to EP for evaluation. ? ?She is accompanied by a family member via telephone. Today, she states she is feeling okay. We reviewed her monitor and MRI results at length. ? ?Typically she monitors her blood pressure at home. ? ?Of note, she states that her arrhythmia seemed to develop subsequent to her prior surgery in 2004, regarding a cyst between her heart and lung. ? ?In her family, her father had CHF, CAD with blockages at 6964o. She denies any known sudden cardiac deaths in her family. Her  maternal grandmother died of a stroke at 632o.  ? ?She denies any chest pain, shortness of breath, or peripheral edema. No lightheadedness, headaches, syncope, orthopnea, or PND. ? ? ?  ?Past Medical History:  ?Diagnosis Date  ? Acute bronchitis 10/31/2015  ? Allergy   ? seasonal  ? Arthritis   ? knee-left  ? Cancer (HCA[place]11/2019  ? right breast ca  ? Cataract   ? right eye  ? Diabetes mellitus 52  ? type 2  ? Diaphragmatic hernia without mention of obstruction or gangrene   ? Endometriosis   ? Esophageal reflux   ? Esophageal stricture   ? GERD (gastroesophageal reflux disease)   ? [facility]rophic obstructive cardiomyopathy) ([facility]  ? Basal septal hypertrophy 16 mm with increased ECV at 30% on cardiac MRI 06/2021 consistent with HOCM  ? Hyperlipidemia   ? Hyperplastic colon polyp   ? Hypertension 52  ? Nonalcoholic fatty liver disease 04/28/2010  ? Qualifier: Diagnosis of  By: JeArnoldo MoraleD, JoBalinda Quails ? Nonspecific abnormal results of liver function study   ? NSVT (nonsustained ventricular tachycardia) ([facility]  ? noted on event monitor 05/2021  ? Personal history of radiation therapy 08/2018  ? bilateral breasts  ? Pharyngitis 02/18/2012  ? Plantar fascial fibromatosis   ? Preventative health care 01/22/2013  ? Seasonal allergies 10/31/2015  ? Sleep apnea   ? has oral appliance but does not fit well due to missing teeth  ? Sore throat 04/03/2017  ? Tubular adenoma of colon   ? Unspecified sleep apnea   ? no  c-pap  ? ? ?Past Surgical History:  ?Procedure Laterality Date  ? ABDOMINAL HYSTERECTOMY  1982  ? partial  ? APPENDECTOMY    ? benigh cyst in lung  2006  ? right  ? BREAST BIOPSY Bilateral 04/03/2018  ? BREAST BIOPSY Bilateral 03/23/2018  ? BREAST CYST INCISION AND DRAINAGE    ? right breast  ? BREAST EXCISIONAL BIOPSY Right 2019  ? BREAST LUMPECTOMY Bilateral 04/2018  ? BREAST LUMPECTOMY WITH RADIOACTIVE SEED LOCALIZATION Bilateral 05/02/2018  ? Procedure: RIGHT BREAST SEED LUMPECTOMY X2 AND LEFT BREAST SEED  GUIDED LUMPECTOMY;  Surgeon: [name], MD;  Location: [place];  Service: General;  Laterality: Bilateral;  ? CATARACT EXTRACTION    ? right eye  ? HERNIA REPAIR    ? per pt, she is not aware of this surgery  ? RE-EXCISION OF BREAST LUMPECTOMY Right 05/25/2018  ? Procedure: RE-EXCISION OF RIGHT BREAST LUMPECTOMY;  Surgeon: [name], MD;  Location: [place];  Service: General;  Laterality: Right;  ? seba    ? TUBAL LIGATION    ? ? ?Current Medications: ?Current Meds  ?Medication Sig  ? albuterol (VENTOLIN HFA) 108 (90 Base) MCG/ACT inhaler Inhale 2 puffs by mouth into the lungs every 6 (six) hours as needed for wheezing or shortness of breath.  ? ASPIR-LOW 81 MG EC tablet TAKE 1 TABLET BY MOUTH DAILY  ? bisoprolol-hydrochlorothiazide (ZIAC) 5-6.25 MG tablet Take 1 tablet by mouth daily.  ? fluticasone (FLONASE) 50 MCG/ACT nasal spray Place 2 sprays into both nostrils daily.  ? glipiZIDE (GLUCOTROL) 5 MG tablet Take 1 tablet (5 mg total) by mouth daily before breakfast and 1 tablet (5 mg total) daily before supper.  ? glucose blood (ONETOUCH VERIO) test strip USE AS INSTRUCTED TWICE A DAY  ? glucose blood test strip OneTouch Verio test strips  ? Magnesium Oxide 400 MG CAPS Take 1 capsule (400 mg total) by mouth daily.  ? MELATONIN PO Take 10 mg by mouth at bedtime.  ? metFORMIN (GLUCOPHAGE) 1000 MG tablet Take 1 tablet (1,000 mg total) by mouth 2 (two) times daily with a meal.  ? montelukast (SINGULAIR) 10 MG tablet Take 1 tablet (10 mg total) by mouth at bedtime as needed.  ? omeprazole (PRILOSEC) 40 MG capsule TAKE 1 CAPSULE BY MOUTH TWICE DAILY  ? rosuvastatin (CRESTOR) 5 MG tablet Take 1 tablet (5 mg total) by mouth daily.  ? Semaglutide (RYBELSUS) 7 MG TABS Take 1 tablet by mouth daily.  ? traZODone (DESYREL) 50 MG tablet Take 1/2 - 1 tablet (25-50 mg total) by mouth at bedtime as needed for sleep.  ?  ? ?Allergies:   Pravastatin, Amoxicillin, Atorvastatin, and Codeine   ? ?Social History  ? ?Socioeconomic History  ? Marital status: Married  ?  Spouse name: [name]  ? Number of children: 2  ? Years of education: Not on file  ? Highest education level: Not on file  ?Occupational History  ? Occupation: Designer, television/film set  ?  Employer: [employer]  ?Tobacco Use  ? Smoking status: Never  ? Smokeless tobacco: Never  ?Substance and Sexual Activity  ? Alcohol use: No  ?  Alcohol/week: 0.0 standard drinks  ? Drug use: No  ? Sexual activity: Yes  ?  Birth control/protection: Post-menopausal  ?  Comment: lives with husband, no dietary restrictions just watching carbs, wears seat belt  ?Other Topics Concern  ? Not on file  ?Social History Narrative  ? Not on  file  ? ?Social Determinants of Health  ? ?Financial Resource Strain: Low Risk   ? Difficulty of Paying Living Expenses: Not hard at all  ?Food Insecurity: No Food Insecurity  ? Worried About Charity fundraiser in the Last Year: Never true  ? Ran Out of Food in the Last Year: Never true  ?Transportation Needs: No Transportation Needs  ? Lack of Transportation (Medical): No  ? Lack of Transportation (Non-Medical): No  ?Physical Activity: Not on file  ?Stress: No Stress Concern Present  ? Feeling of Stress : Not at all  ?Social Connections: Socially Integrated  ? Frequency of Communication with Friends and Family: More than three times a week  ? Frequency of Social Gatherings with Friends and Family: More than three times a week  ? Attends Religious Services: More than 4 times per year  ? Active Member of Clubs or Organizations: Yes  ? Attends Archivist Meetings: More than 4 times per year  ? Marital Status: Married  ?  ? ?Family History: ?The patient's family history includes Alcohol abuse in her maternal aunt; Breast cancer in her mother; Cancer in her maternal aunt; Diabetes in her brother, mother, and sister; Heart attack in her father and maternal grandfather; Heart disease in her father and maternal grandmother; Hyperlipidemia  in her father; Hypertension in her brother, father, maternal grandfather, mother, and sister; Other in her mother; Stroke in her paternal grandmother. There is no history of Colon cancer, Stomach cancer,

## 2021-10-29 ENCOUNTER — Other Ambulatory Visit (HOSPITAL_BASED_OUTPATIENT_CLINIC_OR_DEPARTMENT_OTHER): Payer: Self-pay

## 2021-11-05 ENCOUNTER — Encounter: Payer: Self-pay | Admitting: Emergency Medicine

## 2021-11-05 ENCOUNTER — Other Ambulatory Visit (HOSPITAL_BASED_OUTPATIENT_CLINIC_OR_DEPARTMENT_OTHER): Payer: Self-pay

## 2021-11-05 ENCOUNTER — Emergency Department
Admission: EM | Admit: 2021-11-05 | Discharge: 2021-11-05 | Disposition: A | Discharge status: Home / Self Care | Payer: HMO | Source: Home / Self Care

## 2021-11-05 DIAGNOSIS — J309 Allergic rhinitis, unspecified: Secondary | ICD-10-CM

## 2021-11-05 DIAGNOSIS — J0100 Acute maxillary sinusitis, unspecified: Secondary | ICD-10-CM | POA: Diagnosis not present

## 2021-11-05 LAB — POCT RAPID STREP A (OFFICE): Rapid Strep A Screen: NEGATIVE

## 2021-11-05 MED ORDER — DOXYCYCLINE HYCLATE 100 MG PO CAPS
100.0000 mg | ORAL_CAPSULE | Freq: Two times a day (BID) | ORAL | 0 refills | Status: AC
  Filled 2021-11-05: qty 20, 10d supply, fill #0

## 2021-11-05 MED ORDER — FEXOFENADINE HCL 180 MG PO TABS
180.0000 mg | ORAL_TABLET | Freq: Every day | ORAL | 0 refills | Status: DC
  Filled 2021-11-05: qty 30, 30d supply, fill #0

## 2021-11-05 NOTE — Discharge Instructions (Addendum)
Instructed patient to take medication as directed with food to completion.  Advised patient to take Allegra with first dose of Doxycycline for the next 5 of 10 days.  Advised may use Allegra as needed afterwards for concurrent postnasal drainage/drip.  Encouraged patient to increase daily water intake while taking these medications.  Advised patient if symptoms worsen and/or unresolved please follow-up PCP or here for further evaluation.

## 2021-11-05 NOTE — ED Triage Notes (Signed)
Patient presents to Urgent Care with complaints of headache, sore throat, nasal congestion since 4 days ago. Patient reports her niece having some allergy symptoms. Took Tylenol last night. Taking honey and lemon cough drops for the throat. Denies any fever or chills.

## 2021-11-05 NOTE — ED Provider Notes (Signed)
[name]    CSN: [id number] Arrival date & time: 11/05/21  1055      History   Chief Complaint Chief Complaint  Patient presents with   Sore Throat    HPI [name] is a 71 y.o. female.   HPI Pleasant 71 year old female presents with sore throat, nasal congestion, and headache 4 days.  PMH significant for T2DM, Hypertrophic obstructive cardiomyopathy, HTN, and NSVT.  Past Medical History:  Diagnosis Date   Acute bronchitis 10/31/2015   Allergy    seasonal   Arthritis    knee-left   Cancer ([place]) 04/2018   right breast ca   Cataract    right eye   Diabetes mellitus 52   type 2   Diaphragmatic hernia without mention of obstruction or gangrene    Endometriosis    Esophageal reflux    Esophageal stricture    GERD (gastroesophageal reflux disease)    HOCM (hypertrophic obstructive cardiomyopathy) (HCC)    Basal septal hypertrophy 16 mm with increased ECV at 30% on cardiac MRI 06/2021 consistent with HOCM   Hyperlipidemia    Hyperplastic colon polyp    Hypertension 52   Nonalcoholic fatty liver disease 04/28/2010   Qualifier: Diagnosis of  By: [name]    Nonspecific abnormal results of liver function study    NSVT (nonsustained ventricular tachycardia) ([place])    noted on event monitor 05/2021   Personal history of radiation therapy 08/2018   bilateral breasts   Pharyngitis 02/18/2012   Plantar fascial fibromatosis    Preventative health care 01/22/2013   Seasonal allergies 10/31/2015   Sleep apnea    has oral appliance but does not fit well due to missing teeth   Sore throat 04/03/2017   Tubular adenoma of colon    Unspecified sleep apnea    no c-pap    Patient Active Problem List   Diagnosis Date Noted   HOCM (hypertrophic obstructive cardiomyopathy) ([place]) 06/26/2021   Viral upper respiratory tract infection 04/06/2021   Heart palpitations 02/01/2021   Elevated lipase 02/01/2021   Need for pneumococcal vaccination  02/01/2021   Diabetes mellitus without complication ([place]) 56/21/3086   Chest wall asymmetry 12/05/2019   Insomnia 07/23/2019   Shingles 02/18/2019   Ductal carcinoma in situ (DCIS) of left breast 07/03/2018   Vertigo 03/13/2018   Ductal carcinoma in situ (DCIS) of right breast 03/10/2018   Bilateral calf pain 10/03/2017   Right shoulder pain 10/03/2017   Headache 06/30/2017   Obesity (BMI 30-39.9) 05/02/2017   Seasonal allergies 10/31/2015   Hx of adenomatous colonic polyps 04/30/2014   Cervical cancer screening 01/22/2013   Preventative health care 01/22/2013   Chest pain 01/06/2012   Endometriosis    Chronic cough 57/84/6962   Nonalcoholic fatty liver disease 04/28/2010   OSA (obstructive sleep apnea) 03/26/2009   RT BUNDLE BRANCH BLOCK&LT ANT FASCICULAR BLOCK 12/20/2008   Thoracic back pain 10/10/2008   Osteoarthritis 07/05/2008   Abdominal pain 07/05/2008   Aneurysm, thoracoabdominal ([place]) 03/22/2007   Type 2 diabetes mellitus with hyperglycemia, without long-term current use of insulin ([place]) 10/28/2006   Hyperlipidemia, mixed 10/28/2006   Depression with anxiety 10/28/2006   Essential hypertension 10/28/2006   ESOPHAGEAL STRICTURE 05/04/2004   HIATAL HERNIA 05/04/2004    Past Surgical History:  Procedure Laterality Date   ABDOMINAL HYSTERECTOMY  1982   partial   APPENDECTOMY     benigh cyst in lung  2006   right   BREAST BIOPSY Bilateral  04/03/2018   BREAST BIOPSY Bilateral 03/23/2018   BREAST CYST INCISION AND DRAINAGE     right breast   BREAST EXCISIONAL BIOPSY Right 2019   BREAST LUMPECTOMY Bilateral 04/2018   BREAST LUMPECTOMY WITH RADIOACTIVE SEED LOCALIZATION Bilateral 05/02/2018   Procedure: RIGHT BREAST SEED LUMPECTOMY X2 AND LEFT BREAST SEED GUIDED LUMPECTOMY;  Surgeon: Christine Klein, [name];  Location: [facility];  Service: General;  Laterality: Bilateral;   CATARACT EXTRACTION     right eye   HERNIA REPAIR     per pt, she is not aware of  this surgery   RE-EXCISION OF BREAST LUMPECTOMY Right 05/25/2018   Procedure: RE-EXCISION OF RIGHT BREAST LUMPECTOMY;  Surgeon: Christine Klein, [name];  Location: [facility];  Service: General;  Laterality: Right;   seba     TUBAL LIGATION      OB History   No obstetric history on file.      Home Medications    Prior to Admission medications   Medication Sig Start Date End Date Taking? Authorizing Provider  albuterol (VENTOLIN HFA) 108 (90 Base) MCG/ACT inhaler Inhale 2 puffs by mouth into the lungs every 6 (six) hours as needed for wheezing or shortness of breath. 04/06/21  Yes [name]  ASPIR-LOW 81 MG EC tablet TAKE 1 TABLET BY MOUTH DAILY 01/16/15  Yes [name]  bisoprolol-hydrochlorothiazide [facility]) 5-6.25 MG tablet Take 1 tablet by mouth daily. 09/30/21  Yes [name]  fluticasone (FLONASE) 50 MCG/ACT nasal spray Place 2 sprays into both nostrils daily. 09/30/21  Yes [name]  glipiZIDE (GLUCOTROL) 5 MG tablet Take 1 tablet (5 mg total) by mouth daily before breakfast and 1 tablet (5 mg total) daily before supper. 07/21/21  Yes [name]  metFORMIN (GLUCOPHAGE) 1000 MG tablet Take 1 tablet (1,000 mg total) by mouth 2 (two) times daily with a meal. 07/21/21 07/21/22 Yes [name]  omeprazole (PRILOSEC) 40 MG capsule TAKE 1 CAPSULE BY MOUTH TWICE DAILY 07/02/21 07/02/22 Yes [name]  rosuvastatin (CRESTOR) 5 MG tablet Take 1 tablet (5 mg total) by mouth daily. 09/01/21  Yes [name]  Semaglutide (RYBELSUS) 7 MG TABS Take 1 tablet by mouth daily. 07/21/21  Yes [name]  traZODone (DESYREL) 50 MG tablet Take 1/2 - 1 tablet (25-50 mg total) by mouth at bedtime as needed for sleep. 07/30/21  Yes [name]  glucose blood [facility] VERIO) test strip USE AS INSTRUCTED TWICE A DAY 09/03/21 09/03/22  [name]  glucose blood  test strip OneTouch Verio test strips    Provider, Historical, [name]  Magnesium Oxide 400 MG CAPS Take 1 capsule (400 mg total) by mouth daily. 06/04/21   [name]  MELATONIN PO Take 10 mg by mouth at bedtime.    Provider, Historical, [name]  montelukast (SINGULAIR) 10 MG tablet Take 1 tablet (10 mg total) by mouth at bedtime as needed. 11/16/18   [name]    Family History Family History  Problem Relation Age of Onset   Breast cancer Mother    Hypertension Mother    Diabetes Mother        type 2   Other Mother        esophagus surgery   Heart disease Father    Hyperlipidemia Father    Hypertension Father    Heart attack Father  MI at age 39   Diabetes Brother        type 2   Heart disease Maternal Grandmother    Heart attack Maternal Grandfather    Hypertension Maternal Grandfather    Stroke Paternal Grandmother    Hypertension Sister    Diabetes Sister    Hypertension Brother    Alcohol abuse Maternal Aunt    Cancer Maternal Aunt    Colon cancer Neg Hx    Stomach cancer Neg Hx    Esophageal cancer Neg Hx     Social History Social History   Tobacco Use   Smoking status: Never   Smokeless tobacco: Never  Substance Use Topics   Alcohol use: No    Alcohol/week: 0.0 standard drinks   Drug use: No     Allergies   Pravastatin, Amoxicillin, Atorvastatin, and Codeine   Review of Systems Review of Systems  HENT:  Positive for congestion and postnasal drip.   All other systems reviewed and are negative.   Physical Exam Triage Vital Signs ED Triage Vitals  Enc Vitals Group     BP      Pulse      Resp      Temp      Temp src      SpO2      Weight      Height      Head Circumference      Peak Flow      Pain Score      Pain Loc      Pain Edu?      Excl. in [place]?    No data found.  Updated Vital Signs BP 112/75 (BP Location: Left Arm)   Pulse 76   Temp 98.4 F (36.9 C) (Oral)   Resp 16   SpO2 98%      Physical Exam Vitals  and nursing note reviewed.  Constitutional:      Appearance: Normal appearance. She is normal weight.  HENT:     Head: Normocephalic and atraumatic.     Right Ear: Tympanic membrane, ear canal and external ear normal.     Left Ear: Tympanic membrane, ear canal and external ear normal.     Mouth/Throat:     Mouth: Mucous membranes are moist.     Pharynx: Oropharynx is clear.     Comments: Moderate amount of clear drainage of posterior oropharynx Eyes:     Extraocular Movements: Extraocular movements intact.     Conjunctiva/sclera: Conjunctivae normal.     Pupils: Pupils are equal, round, and reactive to light.  Cardiovascular:     Rate and Rhythm: Normal rate and regular rhythm.     Pulses: Normal pulses.     Heart sounds: Normal heart sounds.  Pulmonary:     Effort: Pulmonary effort is normal.     Breath sounds: Normal breath sounds. No wheezing, rhonchi or rales.  Musculoskeletal:     Cervical back: Normal range of motion and neck supple.  Skin:    General: Skin is warm and dry.  Neurological:     General: No focal deficit present.     Mental Status: She is alert and oriented to person, place, and time. Mental status is at baseline.     UC Treatments / Results  Labs (all labs ordered are listed, but only abnormal results are displayed) Labs Reviewed  POCT RAPID STREP A (OFFICE) - Normal    EKG   Radiology No results found.  Procedures Procedures (  including critical care time)  Medications Ordered in UC Medications - No data to display  Initial Impression / Assessment and Plan / UC Course  I have reviewed the triage vital signs and the nursing notes.  Pertinent labs & imaging results that were available during my care of the patient were reviewed by me and considered in my medical decision making (see chart for details).     MDM: Subacute maxillary sinusitis-Rx'd Doxycycline; 2.  Allergic rhinitis-Rx'd Allegra. Instructed patient to take medication as  directed with food to completion.  Advised patient to take Allegra with first dose of Doxycycline for the next 5 of 10 days.  Advised may use Allegra as needed afterwards for concurrent postnasal drainage/drip.  Encouraged patient to increase daily water intake while taking these medications.  Advised patient if symptoms worsen and/or unresolved please follow-up PCP or here for further evaluation.  Final Clinical Impressions([facility]) / UC Diagnoses   Final diagnoses:  Subacute maxillary sinusitis  Allergic rhinitis, unspecified seasonality, unspecified trigger     Discharge Instructions      Instructed patient to take medication as directed with food to completion.  Advised patient to take Allegra with first dose of Doxycycline for the next 5 of 10 days.  Advised may use Allegra as needed afterwards for concurrent postnasal drainage/drip.  Encouraged patient to increase daily water intake while taking these medications.  Advised patient if symptoms worsen and/or unresolved please follow-up PCP or here for further evaluation.     ED Prescriptions   None    PDMP not reviewed this encounter.   Eliezer Lofts, [place] 11/05/21 1233

## 2021-11-18 ENCOUNTER — Ambulatory Visit: Payer: HMO | Admitting: Internal Medicine

## 2021-11-18 ENCOUNTER — Other Ambulatory Visit (HOSPITAL_BASED_OUTPATIENT_CLINIC_OR_DEPARTMENT_OTHER): Payer: Self-pay

## 2021-11-30 ENCOUNTER — Other Ambulatory Visit (HOSPITAL_BASED_OUTPATIENT_CLINIC_OR_DEPARTMENT_OTHER): Payer: Self-pay

## 2021-12-16 ENCOUNTER — Ambulatory Visit: Payer: HMO | Admitting: Internal Medicine

## 2021-12-24 ENCOUNTER — Other Ambulatory Visit: Payer: Self-pay | Admitting: Family Medicine

## 2021-12-24 ENCOUNTER — Other Ambulatory Visit: Payer: Self-pay | Admitting: Physician Assistant

## 2021-12-25 ENCOUNTER — Other Ambulatory Visit: Payer: Self-pay | Admitting: Family Medicine

## 2021-12-25 ENCOUNTER — Other Ambulatory Visit (HOSPITAL_BASED_OUTPATIENT_CLINIC_OR_DEPARTMENT_OTHER): Payer: Self-pay

## 2021-12-25 MED ORDER — FEXOFENADINE HCL 180 MG PO TABS
180.0000 mg | ORAL_TABLET | Freq: Every day | ORAL | 1 refills | Status: DC
  Filled 2021-12-25 – 2022-04-12 (×2): qty 30, 30d supply, fill #0

## 2021-12-25 MED ORDER — OMEPRAZOLE 40 MG PO CPDR
DELAYED_RELEASE_CAPSULE | Freq: Two times a day (BID) | ORAL | 1 refills | Status: DC
  Filled 2021-12-25: qty 180, 90d supply, fill #0
  Filled 2022-03-29: qty 180, 90d supply, fill #1

## 2022-01-04 ENCOUNTER — Other Ambulatory Visit (HOSPITAL_BASED_OUTPATIENT_CLINIC_OR_DEPARTMENT_OTHER): Payer: Self-pay

## 2022-01-12 DIAGNOSIS — E119 Type 2 diabetes mellitus without complications: Secondary | ICD-10-CM | POA: Diagnosis not present

## 2022-01-12 DIAGNOSIS — H524 Presbyopia: Secondary | ICD-10-CM | POA: Diagnosis not present

## 2022-01-12 LAB — HM DIABETES EYE EXAM

## 2022-01-26 ENCOUNTER — Other Ambulatory Visit: Payer: Self-pay | Admitting: Adult Health

## 2022-01-26 DIAGNOSIS — Z1231 Encounter for screening mammogram for malignant neoplasm of breast: Secondary | ICD-10-CM

## 2022-01-28 ENCOUNTER — Ambulatory Visit (INDEPENDENT_AMBULATORY_CARE_PROVIDER_SITE_OTHER): Payer: HMO | Admitting: Family Medicine

## 2022-01-28 VITALS — BP 120/72 | HR 68 | Temp 98.4°F | Resp 16 | Ht 63.0 in | Wt 185.4 lb

## 2022-01-28 DIAGNOSIS — R1012 Left upper quadrant pain: Secondary | ICD-10-CM

## 2022-01-28 DIAGNOSIS — R748 Abnormal levels of other serum enzymes: Secondary | ICD-10-CM

## 2022-01-28 DIAGNOSIS — E782 Mixed hyperlipidemia: Secondary | ICD-10-CM

## 2022-01-28 DIAGNOSIS — E119 Type 2 diabetes mellitus without complications: Secondary | ICD-10-CM

## 2022-01-28 DIAGNOSIS — I10 Essential (primary) hypertension: Secondary | ICD-10-CM

## 2022-01-28 DIAGNOSIS — J329 Chronic sinusitis, unspecified: Secondary | ICD-10-CM | POA: Diagnosis not present

## 2022-01-28 DIAGNOSIS — I421 Obstructive hypertrophic cardiomyopathy: Secondary | ICD-10-CM | POA: Diagnosis not present

## 2022-01-28 DIAGNOSIS — E1165 Type 2 diabetes mellitus with hyperglycemia: Secondary | ICD-10-CM | POA: Diagnosis not present

## 2022-01-28 DIAGNOSIS — K5900 Constipation, unspecified: Secondary | ICD-10-CM

## 2022-01-28 DIAGNOSIS — N644 Mastodynia: Secondary | ICD-10-CM | POA: Diagnosis not present

## 2022-01-28 LAB — COMPREHENSIVE METABOLIC PANEL
ALT: 33 U/L (ref 0–35)
AST: 25 U/L (ref 0–37)
Albumin: 4.2 g/dL (ref 3.5–5.2)
Alkaline Phosphatase: 70 U/L (ref 39–117)
BUN: 14 mg/dL (ref 6–23)
CO2: 30 mEq/L (ref 19–32)
Calcium: 9.3 mg/dL (ref 8.4–10.5)
Chloride: 101 mEq/L (ref 96–112)
Creatinine, Ser: 1 mg/dL (ref 0.40–1.20)
GFR: 57.53 mL/min — ABNORMAL LOW (ref >60.00)
Glucose, Bld: 83 mg/dL (ref 70–99)
Potassium: 4.2 mEq/L (ref 3.5–5.1)
Sodium: 142 mEq/L (ref 135–145)
Total Bilirubin: 0.4 mg/dL (ref 0.2–1.2)
Total Protein: 6.8 g/dL (ref 6.0–8.3)

## 2022-01-28 LAB — CBC
HCT: 38.4 % (ref 36.0–46.0)
Hemoglobin: 12.4 g/dL (ref 12.0–15.0)
MCHC: 32.4 g/dL (ref 30.0–36.0)
MCV: 80 fl (ref 78.0–100.0)
Platelets: 227 10*3/uL (ref 150.0–400.0)
RBC: 4.81 Mil/uL (ref 3.87–5.11)
RDW: 14.2 % (ref 11.5–15.5)
WBC: 4.7 10*3/uL (ref 4.0–10.5)

## 2022-01-28 LAB — LIPASE: Lipase: 101 U/L — ABNORMAL HIGH (ref 11.0–59.0)

## 2022-01-28 LAB — LIPID PANEL
Cholesterol: 130 mg/dL (ref 0–200)
HDL: 40.1 mg/dL (ref >39.00)
LDL Cholesterol: 69 mg/dL (ref 0–99)
NonHDL: 89.81
Total CHOL/HDL Ratio: 3
Triglycerides: 104 mg/dL (ref 0.0–149.0)
VLDL: 20.8 mg/dL (ref 0.0–40.0)

## 2022-01-28 LAB — HEMOGLOBIN A1C: Hgb A1c MFr Bld: 7.7 % — ABNORMAL HIGH (ref 4.6–6.5)

## 2022-01-28 LAB — TSH: TSH: 0.92 u[IU]/mL (ref 0.35–5.50)

## 2022-01-28 LAB — AMYLASE: Amylase: 69 U/L (ref 27–131)

## 2022-01-28 NOTE — Progress Notes (Signed)
Subjective:   By signing my name below, I, Kellie Simmering, attest that this documentation has been prepared under the direction and in the presence of Mosie Lukes, MD 01/28/2022.     Patient ID: [name], female    DOB: 1950/07/19, 71 y.o.   MRN: [id number]  Chief Complaint  Patient presents with   Follow-up    Here for 6 month follow up     HPI Patient is in today for an office visit.  Sinusitis: She was admitted to the ER in 10/2021 for subacute maxillary sinusitis and reports that her sinus is still causing her irritation.   Breast cancer: She was diagnosed with breast cancer in 2019. She reports having right breast tenderness. She states that the tenderness comes and goes. She denies skin dimpling and discharge.  Cardiomyopathy: She reports that she was diagnosed with cardiomyopathy.    Abdominal pain: She complains of abdominal pain in the right upper quadrant that radiates to her back. She states that she wakes up every morning with abdominal pain and when she turns over, she also experiences intense back pain.   Constipation: She says that she has been constipated for 2 weeks and has been taking Miralax to manage this.  Immunizations: She is due for her Shingles immunizations. She has been informed about receiving COVID-19 and RSV immunizations.   Liver: She is inquiring about a supplement to better manage her liver health.   Past Medical History:  Diagnosis Date   Acute bronchitis 10/31/2015   Allergy    seasonal   Arthritis    knee-left   Cancer ([place]) 04/2018   right breast ca   Cataract    right eye   Diabetes mellitus 52   type 2   Diaphragmatic hernia without mention of obstruction or gangrene    Endometriosis    Esophageal reflux    Esophageal stricture    GERD (gastroesophageal reflux disease)    HOCM (hypertrophic obstructive cardiomyopathy) (HCC)    Basal septal hypertrophy 16 mm with increased ECV at 30% on cardiac MRI 06/2021 consistent  with HOCM   Hyperlipidemia    Hyperplastic colon polyp    Hypertension 52   Nonalcoholic fatty liver disease 04/28/2010   Qualifier: Diagnosis of  By: Arnoldo Morale MD, Balinda Quails    Nonspecific abnormal results of liver function study    NSVT (nonsustained ventricular tachycardia) ([place])    noted on event monitor 05/2021   Personal history of radiation therapy 08/2018   bilateral breasts   Pharyngitis 02/18/2012   Plantar fascial fibromatosis    Preventative health care 01/22/2013   Seasonal allergies 10/31/2015   Sleep apnea    has oral appliance but does not fit well due to missing teeth   Sore throat 04/03/2017   Tubular adenoma of colon    Unspecified sleep apnea    no c-pap    Past Surgical History:  Procedure Laterality Date   ABDOMINAL HYSTERECTOMY  1982   partial   APPENDECTOMY     benigh cyst in lung  2006   right   BREAST BIOPSY Bilateral 04/03/2018   BREAST BIOPSY Bilateral 03/23/2018   BREAST CYST INCISION AND DRAINAGE     right breast   BREAST EXCISIONAL BIOPSY Right 2019   BREAST LUMPECTOMY Bilateral 04/2018   BREAST LUMPECTOMY WITH RADIOACTIVE SEED LOCALIZATION Bilateral 05/02/2018   Procedure: RIGHT BREAST SEED LUMPECTOMY X2 AND LEFT BREAST SEED GUIDED LUMPECTOMY;  Surgeon: [name], MD;  Location: [facility];  Service: General;  Laterality: Bilateral;   CATARACT EXTRACTION     right eye   HERNIA REPAIR     per pt, she is not aware of this surgery   RE-EXCISION OF BREAST LUMPECTOMY Right 05/25/2018   Procedure: RE-EXCISION OF RIGHT BREAST LUMPECTOMY;  Surgeon: [name], MD;  Location: [facility];  Service: General;  Laterality: Right;   seba     TUBAL LIGATION      Family History  Problem Relation Age of Onset   Breast cancer Mother    Hypertension Mother    Diabetes Mother        type 2   Other Mother        esophagus surgery   Heart disease Father    Hyperlipidemia Father    Hypertension Father    Heart attack  Father        MI at age 10   Diabetes Brother        type 2   Heart disease Maternal Grandmother    Heart attack Maternal Grandfather    Hypertension Maternal Grandfather    Stroke Paternal Grandmother    Hypertension Sister    Diabetes Sister    Hypertension Brother    Alcohol abuse Maternal Aunt    Cancer Maternal Aunt    Colon cancer Neg Hx    Stomach cancer Neg Hx    Esophageal cancer Neg Hx     Social History   Socioeconomic History   Marital status: Married    Spouse name: English as a second language teacher   Number of children: 2   Years of education: Not on file   Highest education level: Not on file  Occupational History   Occupation: Building surveyor: [employer]  Tobacco Use   Smoking status: Never   Smokeless tobacco: Never  Substance and Sexual Activity   Alcohol use: No    Alcohol/week: 0.0 standard drinks of alcohol   Drug use: No   Sexual activity: Yes    Birth control/protection: Post-menopausal    Comment: lives with husband, no dietary restrictions just watching carbs, wears seat belt  Other Topics Concern   Not on file  Social History Narrative   Not on file   Social Determinants of Health   Financial Resource Strain: Low Risk  (10/13/2021)   Overall Financial Resource Strain (CARDIA)    Difficulty of Paying Living Expenses: Not hard at all  Food Insecurity: No Food Insecurity (10/13/2021)   Hunger Vital Sign    Worried About Running Out of Food in the Last Year: Never true    Ran Out of Food in the Last Year: Never true  Transportation Needs: No Transportation Needs (10/13/2021)   PRAPARE - Hydrologist (Medical): No    Lack of Transportation (Non-Medical): No  Physical Activity: Not on file  Stress: No Stress Concern Present (10/13/2021)   [place]    Feeling of Stress : Not at all  Social Connections: Socially Integrated (10/13/2021)   Social Connection and  Isolation Panel [NHANES]    Frequency of Communication with Friends and Family: More than three times a week    Frequency of Social Gatherings with Friends and Family: More than three times a week    Attends Religious Services: More than 4 times per year    Active Member of [employer] or Organizations: Yes    Attends Archivist Meetings:  More than 4 times per year    Marital Status: Married  Human resources officer Violence: Not At Risk (06/22/2018)   Humiliation, Afraid, Rape, and Kick questionnaire    Fear of Current or Ex-Partner: No    Emotionally Abused: No    Physically Abused: No    Sexually Abused: No    Outpatient Medications Prior to Visit  Medication Sig Dispense Refill   albuterol (VENTOLIN HFA) 108 (90 Base) MCG/ACT inhaler Inhale 2 puffs by mouth into the lungs every 6 (six) hours as needed for wheezing or shortness of breath. 18 g 0   ASPIR-LOW 81 MG EC tablet TAKE 1 TABLET BY MOUTH DAILY 30 tablet 3   bisoprolol-hydrochlorothiazide (ZIAC) 5-6.25 MG tablet Take 1 tablet by mouth daily. 90 tablet 1   fexofenadine (SM FEXOFENADINE HCL) 180 MG tablet Take 1 tablet (180 mg total) by mouth daily. 30 tablet 1   fluticasone (FLONASE) 50 MCG/ACT nasal spray Place 2 sprays into both nostrils daily. 16 g 5   glipiZIDE (GLUCOTROL) 5 MG tablet Take 1 tablet (5 mg total) by mouth daily before breakfast and 1 tablet (5 mg total) daily before supper. 180 tablet 3   glucose blood (ONETOUCH VERIO) test strip USE AS INSTRUCTED TWICE A DAY 100 strip 12   glucose blood test strip OneTouch Verio test strips     Magnesium Oxide 400 MG CAPS Take 1 capsule (400 mg total) by mouth daily. 90 capsule 3   MELATONIN PO Take 10 mg by mouth at bedtime.     metFORMIN (GLUCOPHAGE) 1000 MG tablet Take 1 tablet (1,000 mg total) by mouth 2 (two) times daily with a meal. 180 tablet 3   montelukast (SINGULAIR) 10 MG tablet Take 1 tablet (10 mg total) by mouth at bedtime as needed. 30 tablet 3   omeprazole  (PRILOSEC) 40 MG capsule TAKE 1 CAPSULE BY MOUTH TWICE DAILY 180 capsule 1   rosuvastatin (CRESTOR) 5 MG tablet Take 1 tablet (5 mg total) by mouth daily. 90 tablet 1   Semaglutide (RYBELSUS) 7 MG TABS Take 1 tablet by mouth daily. 90 tablet 3   traZODone (DESYREL) 50 MG tablet Take 1/2 - 1 tablet (25-50 mg total) by mouth at bedtime as needed for sleep. 30 tablet 3   Facility-Administered Medications Prior to Visit  Medication Dose Route Frequency Provider Last Rate Last Admin   Chlorhexidine Gluconate Cloth 2 % PADS 6 each  6 each Topical Once [name], MD       And   Chlorhexidine Gluconate Cloth 2 % PADS 6 each  6 each Topical Once [name], MD        Allergies  Allergen Reactions   Pravastatin Other (See Comments)    Myalgia, fatigue, memory loss   Amoxicillin Itching    REACTION: rash   Atorvastatin Other (See Comments)    Myalgia, fatigue, memory loss   Codeine Nausea And Vomiting    Review of Systems  Gastrointestinal:  Positive for abdominal pain (Upper right quadrant).       Objective:    Physical Exam Constitutional:      General: She is not in acute distress.    Appearance: Normal appearance. She is not ill-appearing.  HENT:     Head: Normocephalic and atraumatic.     Right Ear: External ear normal.     Left Ear: External ear normal.     Mouth/Throat:     Mouth: Mucous membranes are moist.     Pharynx: Oropharynx  is clear.  Eyes:     Extraocular Movements: Extraocular movements intact.     Pupils: Pupils are equal, round, and reactive to light.  Cardiovascular:     Rate and Rhythm: Normal rate and regular rhythm.     Pulses: Normal pulses.     Heart sounds: Normal heart sounds. No murmur heard.    No gallop.  Pulmonary:     Effort: Pulmonary effort is normal. No respiratory distress.     Breath sounds: Normal breath sounds. No wheezing or rales.  Chest:     Comments: (+) Right breast tenderness. Abdominal:     General: Bowel sounds are  normal.     Palpations: Abdomen is soft.     Tenderness: There is no abdominal tenderness.  Skin:    General: Skin is warm and dry.  Neurological:     Mental Status: She is alert and oriented to person, place, and time.  Psychiatric:        Mood and Affect: Mood normal.        Behavior: Behavior normal.        Judgment: Judgment normal.     BP 120/72 (BP Location: Right Arm, Patient Position: Sitting, Cuff Size: Normal)   Pulse 68   Temp 98.4 F (36.9 C) (Oral)   Resp 16   Ht _0  (1.6 m)   Wt 185 lb 6.4 oz (84.1 kg)   SpO2 98%   BMI 32.84 kg/m  Wt Readings from Last 3 Encounters:  01/28/22 185 lb 6.4 oz (84.1 kg)  10/22/21 185 lb 9.6 oz (84.2 kg)  09/03/21 184 lb (83.5 kg)    Diabetic Foot Exam - Simple   No data filed    Lab Results  Component Value Date   WBC 4.7 01/28/2022   HGB 12.4 01/28/2022   HCT 38.4 01/28/2022   PLT 227.0 01/28/2022   GLUCOSE 83 01/28/2022   CHOL 130 01/28/2022   TRIG 104.0 01/28/2022   HDL 40.10 01/28/2022   LDLDIRECT 96.0 08/19/2020   LDLCALC 69 01/28/2022   ALT 33 01/28/2022   AST 25 01/28/2022   NA 142 01/28/2022   K 4.2 01/28/2022   CL 101 01/28/2022   CREATININE 1.00 01/28/2022   BUN 14 01/28/2022   CO2 30 01/28/2022   TSH 0.92 01/28/2022   HGBA1C 7.7 (H) 01/28/2022   MICROALBUR <0.7 12/09/2020    Lab Results  Component Value Date   TSH 0.92 01/28/2022   Lab Results  Component Value Date   WBC 4.7 01/28/2022   HGB 12.4 01/28/2022   HCT 38.4 01/28/2022   MCV 80.0 01/28/2022   PLT 227.0 01/28/2022   Lab Results  Component Value Date   NA 142 01/28/2022   K 4.2 01/28/2022   CO2 30 01/28/2022   GLUCOSE 83 01/28/2022   BUN 14 01/28/2022   CREATININE 1.00 01/28/2022   BILITOT 0.4 01/28/2022   ALKPHOS 70 01/28/2022   AST 25 01/28/2022   ALT 33 01/28/2022   PROT 6.8 01/28/2022   ALBUMIN 4.2 01/28/2022   CALCIUM 9.3 01/28/2022   ANIONGAP 11 01/15/2021   EGFR 60 05/29/2021   GFR 57.53 (L) 01/28/2022    Lab Results  Component Value Date   CHOL 130 01/28/2022   Lab Results  Component Value Date   HDL 40.10 01/28/2022   Lab Results  Component Value Date   LDLCALC 69 01/28/2022   Lab Results  Component Value Date   TRIG 104.0 01/28/2022   Lab  Results  Component Value Date   CHOLHDL 3 01/28/2022   Lab Results  Component Value Date   HGBA1C 7.7 (H) 01/28/2022       Assessment & Plan:   Problem List Items Addressed This Visit     Type 2 diabetes mellitus with hyperglycemia, without long-term current use of insulin ([place])   Relevant Orders   Hemoglobin A1c (Completed)   Hyperlipidemia, mixed   Relevant Orders   Lipid panel (Completed)   Essential hypertension    Well controlled, no changes to meds. Encouraged heart healthy diet such as the DASH diet and exercise as tolerated.       Relevant Orders   CBC (Completed)   Comprehensive metabolic panel (Completed)   TSH (Completed)   Abdominal pain    Intermittent but most notable in right upper quadrant, will proceed with abdominal Ultrasound for further evaluation.       Relevant Orders   US Abdomen Complete   Diabetes mellitus without complication (HCC)    [id number] acceptable, minimize simple carbs. Increase exercise as tolerated. Continue current meds      Elevated lipase    It is improving but still present. Will continue to monitor      Relevant Orders   Lipase (Completed)   Amylase (Completed)   US Abdomen Complete   HOCM (hypertrophic obstructive cardiomyopathy) ([place])    She is following with cardiology and working to control risk factors. Encouraged attempts at modest weight loss and heart healthy diet      Breast pain, right    Referred to [facility] for further consideration given her history      Relevant Orders   Ambulatory referral to Breast Clinic   Constipation    Encouraged increased hydration and fiber in diet. Daily probiotics. If bowels not moving can use MOM 2 tbls po in 4 oz of  warm prune juice by mouth every 2-3 days. If no results then repeat in 4 hours with  Dulcolax suppository pr, may repeat again in 4 more hours as needed. Seek care if symptoms worsen. Consider daily Miralax and/or Dulcolax if symptoms persist.       Relevant Orders   US Abdomen Complete   Chronic sinusitis - Primary    Has struggled with sinus congestion and discomfort for months despite numerous treatments. Will refer to ENT for further consideration      Relevant Orders   Ambulatory referral to ENT   No orders of the defined types were placed in this encounter.  I, [name], MD, personally preformed the services described in this documentation.  All medical record entries made by the scribe were at my direction and in my presence.  I have reviewed the chart and discharge instructions (if applicable) and agree that the record reflects my personal performance and is accurate and complete. 01/28/2022  I,Mohammed Iqbal,acting as a scribe for [name], MD.,have documented all relevant documentation on the behalf of [name], MD,as directed by  [name], MD while in the presence of [name], MD.  [name], MD

## 2022-01-28 NOTE — Patient Instructions (Addendum)
Yerba Matte Tea do not drink  Encouraged increased hydration and fiber in diet. Daily probiotics. If bowels not moving can use Milk Of Magnesia 2 tbls po in 4 oz of warm prune juice by mouth every 2-3 days. If no results then repeat in 4 hours with  Dulcolax suppository pr, may repeat again in 4 more hours as needed. Seek care if symptoms worsen. Consider daily Miralax and/or Dulcolax if symptoms persist.    Shingrix is the new shingles shot, 2 shots over 2-6 months, confirm coverage with insurance and document, then can return here for shots with nurse appt or at pharmacy   RSV immunization at [place] shot late September, early October

## 2022-01-29 DIAGNOSIS — K5900 Constipation, unspecified: Secondary | ICD-10-CM | POA: Insufficient documentation

## 2022-01-29 DIAGNOSIS — J329 Chronic sinusitis, unspecified: Secondary | ICD-10-CM | POA: Insufficient documentation

## 2022-01-29 DIAGNOSIS — N644 Mastodynia: Secondary | ICD-10-CM | POA: Insufficient documentation

## 2022-01-29 NOTE — Assessment & Plan Note (Signed)
She is following with cardiology and working to control risk factors. Encouraged attempts at modest weight loss and heart healthy diet

## 2022-01-29 NOTE — Assessment & Plan Note (Signed)
It is improving but still present. Will continue to monitor

## 2022-01-29 NOTE — Assessment & Plan Note (Signed)
Encouraged increased hydration and fiber in diet. Daily probiotics. If bowels not moving can use MOM 2 tbls po in 4 oz of warm prune juice by mouth every 2-3 days. If no results then repeat in 4 hours with  Dulcolax suppository pr, may repeat again in 4 more hours as needed. Seek care if symptoms worsen. Consider daily Miralax and/or Dulcolax if symptoms persist.  

## 2022-01-29 NOTE — Assessment & Plan Note (Signed)
Has struggled with sinus congestion and discomfort for months despite numerous treatments. Will refer to ENT for further consideration

## 2022-01-29 NOTE — Assessment & Plan Note (Signed)
Referred to [facility] for further consideration given her history

## 2022-01-29 NOTE — Assessment & Plan Note (Signed)
hgba1c acceptable, minimize simple carbs. Increase exercise as tolerated. Continue current meds 

## 2022-01-29 NOTE — Assessment & Plan Note (Signed)
Well controlled, no changes to meds. Encouraged heart healthy diet such as the DASH diet and exercise as tolerated.  °

## 2022-01-29 NOTE — Assessment & Plan Note (Addendum)
Intermittent but most notable in right upper quadrant, will proceed with abdominal Ultrasound for further evaluation.

## 2022-01-30 ENCOUNTER — Ambulatory Visit (HOSPITAL_BASED_OUTPATIENT_CLINIC_OR_DEPARTMENT_OTHER)
Admission: RE | Admit: 2022-01-30 | Discharge: 2022-01-30 | Disposition: A | Discharge status: Home / Self Care | Payer: HMO | Source: Ambulatory Visit | Attending: Family Medicine | Admitting: Family Medicine

## 2022-01-30 DIAGNOSIS — R1012 Left upper quadrant pain: Secondary | ICD-10-CM | POA: Diagnosis not present

## 2022-01-30 DIAGNOSIS — R748 Abnormal levels of other serum enzymes: Secondary | ICD-10-CM | POA: Diagnosis not present

## 2022-01-30 DIAGNOSIS — K5900 Constipation, unspecified: Secondary | ICD-10-CM | POA: Diagnosis not present

## 2022-01-30 DIAGNOSIS — K7689 Other specified diseases of liver: Secondary | ICD-10-CM | POA: Diagnosis not present

## 2022-02-02 ENCOUNTER — Other Ambulatory Visit (HOSPITAL_BASED_OUTPATIENT_CLINIC_OR_DEPARTMENT_OTHER): Payer: Self-pay

## 2022-02-02 NOTE — Progress Notes (Unsigned)
Name: [name]  Age/ Sex: 71 y.o., female   MRN/ DOB: [id number], [birth year]     PCP: Mosie Lukes, MD   Reason for Endocrinology Evaluation: Type 2 Diabetes Mellitus  Initial Endocrine Consultative Visit: 02/22/2017    PATIENT IDENTIFIER: [name] is a 71 y.o. female with a past medical history of T2DM, HTN , Dyslipidemia, Hx of breast Ca ( S/P right lumpectomy) . The patient has followed with Endocrinology clinic since 02/16/2019 for consultative assistance with management of her diabetes.     DIABETIC HISTORY:  [name] was diagnosed with T2DM in 2001. She has been on metformin, Glimepiride and Januvia. Invokana added in 2018 but was cost prohibitive. Her hemoglobin A1c has ranged from 7.0% in 2016, peaking at 8.8% in  2021.    She used to see Dr.Gherghe but was lost to follow up by 2019 , until her return to our clinic 08/2019.    Started Rybelsus 08/2020 but this had to be stopped by the end of 2022 due to inability to qualify for pt assistance   SUBJECTIVE:   During the last visit (07/21/2021):  A1c 7.6% continue metformin, decrease glipizide, and we started Rybelsus     Today (02/03/2022): [name] is here for a follow up on diabetes management.  She checks her blood sugars 2 times daily The patient has had hypoglycemic episodes since the last clinic visit, which typically occur during the day , she has not been symptomatic    She eats twice a day , sometimes she snacks for lunch  She has not been able to get the [place]:  Metformin 1000 mg, BID Glipizide 5 mg , 1 tablet BID Rybelsus 7 mg daily- not taking      Statin:  Crestor ACE-I/ARB: No   METER DOWNLOAD SUMMARY: unable to download 46 - 157 mg/dL    DIABETIC COMPLICATIONS: Microvascular complications:   Denies: CKD, retinopathy , neuropathy Last Eye Exam: Completed 01/09/2021  Macrovascular complications:   Denies: CAD, CVA, PVD   HISTORY:   Past Medical History:  Past Medical History:  Diagnosis Date   Acute bronchitis 10/31/2015   Allergy    seasonal   Arthritis    knee-left   Cancer ([place]) 04/2018   right breast ca   Cataract    right eye   Diabetes mellitus 52   type 2   Diaphragmatic hernia without mention of obstruction or gangrene    Endometriosis    Esophageal reflux    Esophageal stricture    GERD (gastroesophageal reflux disease)    HOCM (hypertrophic obstructive cardiomyopathy) ([place])    Basal septal hypertrophy 16 mm with increased ECV at 30% on cardiac MRI 06/2021 consistent with HOCM   Hyperlipidemia    Hyperplastic colon polyp    Hypertension 52   Nonalcoholic fatty liver disease 04/28/2010   Qualifier: Diagnosis of  By: Arnoldo Morale MD, John E    Nonspecific abnormal results of liver function study    NSVT (nonsustained ventricular tachycardia) ([place])    noted on event monitor 05/2021   Personal history of radiation therapy 08/2018   bilateral breasts   Pharyngitis 02/18/2012   Plantar fascial fibromatosis    Preventative health care 01/22/2013   Seasonal allergies 10/31/2015   Sleep apnea    has oral appliance but does not fit well due to missing teeth   Sore throat 04/03/2017   Tubular adenoma of colon  Unspecified sleep apnea    no c-pap   Past Surgical History:  Past Surgical History:  Procedure Laterality Date   ABDOMINAL HYSTERECTOMY  1982   partial   APPENDECTOMY     benigh cyst in lung  2006   right   BREAST BIOPSY Bilateral 04/03/2018   BREAST BIOPSY Bilateral 03/23/2018   BREAST CYST INCISION AND DRAINAGE     right breast   BREAST EXCISIONAL BIOPSY Right 2019   BREAST LUMPECTOMY Bilateral 04/2018   BREAST LUMPECTOMY WITH RADIOACTIVE SEED LOCALIZATION Bilateral 05/02/2018   Procedure: RIGHT BREAST SEED LUMPECTOMY X2 AND LEFT BREAST SEED GUIDED LUMPECTOMY;  Surgeon: [name], MD;  Location: [facility];  Service: General;  Laterality: Bilateral;   CATARACT  EXTRACTION     right eye   HERNIA REPAIR     per pt, she is not aware of this surgery   RE-EXCISION OF BREAST LUMPECTOMY Right 05/25/2018   Procedure: RE-EXCISION OF RIGHT BREAST LUMPECTOMY;  Surgeon: [name], MD;  Location: [facility];  Service: General;  Laterality: Right;   seba     TUBAL LIGATION     Social History:  reports that she has never smoked. She has never used smokeless tobacco. She reports that she does not drink alcohol and does not use drugs. Family History:  Family History  Problem Relation Age of Onset   Breast cancer Mother    Hypertension Mother    Diabetes Mother        type 2   Other Mother        esophagus surgery   Heart disease Father    Hyperlipidemia Father    Hypertension Father    Heart attack Father        MI at age 55   Diabetes Brother        type 2   Heart disease Maternal Grandmother    Heart attack Maternal Grandfather    Hypertension Maternal Grandfather    Stroke Paternal Grandmother    Hypertension Sister    Diabetes Sister    Hypertension Brother    Alcohol abuse Maternal Aunt    Cancer Maternal Aunt    Colon cancer Neg Hx    Stomach cancer Neg Hx    Esophageal cancer Neg Hx      HOME MEDICATIONS: Allergies as of 02/03/2022       Reactions   Pravastatin Other (See Comments)   Myalgia, fatigue, memory loss   Amoxicillin Itching   REACTION: rash   Atorvastatin Other (See Comments)   Myalgia, fatigue, memory loss   Codeine Nausea And Vomiting        Medication List        Accurate as of February 03, 2022  1:48 PM. If you have any questions, ask your nurse or doctor.          STOP taking these medications    glipiZIDE 5 MG tablet Commonly known as: GLUCOTROL Stopped by: Dorita Sciara, MD   Rybelsus 7 MG Tabs Generic drug: Semaglutide Stopped by: Dorita Sciara, MD       TAKE these medications    albuterol 108 (90 Base) MCG/ACT inhaler Commonly known as: VENTOLIN  HFA Inhale 2 puffs by mouth into the lungs every 6 (six) hours as needed for wheezing or shortness of breath.   Aspir-Low 81 MG tablet Generic drug: aspirin EC TAKE 1 TABLET BY MOUTH DAILY   bisoprolol-hydrochlorothiazide 5-6.25 MG tablet Commonly known as: [employer]  Take 1 tablet by mouth daily.   fexofenadine 180 MG tablet Commonly known as: SM Fexofenadine HCl Take 1 tablet (180 mg total) by mouth daily.   fluticasone 50 MCG/ACT nasal spray Commonly known as: FLONASE Place 2 sprays into both nostrils daily.   glucose blood test strip OneTouch Verio test strips   OneTouch Verio test strip Generic drug: glucose blood USE AS INSTRUCTED TWICE A DAY   Magnesium Oxide 400 MG Caps Take 1 capsule (400 mg total) by mouth daily.   MELATONIN PO Take 10 mg by mouth at bedtime.   metFORMIN 1000 MG tablet Commonly known as: GLUCOPHAGE Take 1 tablet (1,000 mg total) by mouth 2 (two) times daily with a meal.   montelukast 10 MG tablet Commonly known as: SINGULAIR Take 1 tablet (10 mg total) by mouth at bedtime as needed.   omeprazole 40 MG capsule Commonly known as: PRILOSEC TAKE 1 CAPSULE BY MOUTH TWICE DAILY   repaglinide 0.5 MG tablet Commonly known as: PRANDIN Take 1 tablet (0.5 mg total) by mouth 2 (two) times daily before a meal. Started by: Dorita Sciara, MD   rosuvastatin 5 MG tablet Commonly known as: CRESTOR Take 1 tablet (5 mg total) by mouth daily.   traZODone 50 MG tablet Commonly known as: DESYREL Take 1/2 - 1 tablet (25-50 mg total) by mouth at bedtime as needed for sleep.         OBJECTIVE:   Vital Signs: BP 126/74 (BP Location: Left Arm, Patient Position: Sitting, Cuff Size: Large)   Pulse 71   Ht '5\' 3"'$  (1.6 m)   Wt 188 lb (85.3 kg)   SpO2 99%   BMI 33.30 kg/m      Exam: General: Pt appears well and is in NAD  Lungs: Clear with good BS bilat with no rales, rhonchi, or wheezes  Heart: RRR   Abdomen: Normoactive bowel sounds, soft,  nontender, without masses or organomegaly palpable  Extremities: No pretibial edema.  Crepitus noted over the left knee  Neuro: MS is good with appropriate affect, pt is alert and Ox3       DM foot exam 12/09/2020  The skin of the feet is intact without sores or ulcerations. The pedal pulses are 2+ on right and 2+ on left. The sensation is intact to a screening 5.07, 10 gram monofilament bilaterally  DATA REVIEWED:  Lab Results  Component Value Date   HGBA1C 7.7 (H) 01/28/2022   HGBA1C 7.6 (A) 07/21/2021   HGBA1C 6.9 (A) 12/09/2020   Lab Results  Component Value Date   MICROALBUR <0.7 12/09/2020   LDLCALC 69 01/28/2022   CREATININE 1.00 01/28/2022   Lab Results  Component Value Date   MICRALBCREAT 3.8 12/09/2020     Lab Results  Component Value Date   CHOL 130 01/28/2022   HDL 40.10 01/28/2022   LDLCALC 69 01/28/2022   LDLDIRECT 96.0 08/19/2020   TRIG 104.0 01/28/2022   CHOLHDL 3 01/28/2022        Results for MYIA, BERGH (MRN [id number]) as of 12/10/2020 13:50  Ref. Range 12/09/2020 10:54  Creatinine,U Latest Units: mg/dL 18.6  Microalb, Ur Latest Ref Range: 0.0 - 1.9 mg/dL <0.7  MICROALB/CREAT RATIO Latest Ref Range: 0.0 - 30.0 mg/g 3.8   ASSESSMENT / PLAN / RECOMMENDATIONS:   1) Type 2 Diabetes Mellitus,Sub-Optimally  Controlled, Without complications - Most recent A1c of 7.7  %. Goal A1c < 7.0 %.    -Pt with recurrent hypoglycemia in the middle  of the day, I will switch Glipizide to Repaglinide as below  - Rybelsus has been cost prohibitive, and she did not qualify for pt assistance  -I am going to stop glipizide and start her on repaglinide to see if this will help prevent hypoglycemia -She does not always eat lunch so she will be taking the repaglinide with breakfast and supper -The reason for elevated A1c is due to postprandial hyperglycemia -We may consider pioglitazone if needed in the future -She was also advised to contact her insurance  company and query about coverage for SGLT2 inhibitors   MEDICATIONS: -Stop glipizide -Start repaglinide 0.5 mg, 1 tab before breakfast and 1 tab before supper -Continue metformin 1000 mg twice daily   EDUCATION / INSTRUCTIONS: BG monitoring instructions: Patient is instructed to check her blood sugars 2 times a day, fasting and supper time. Call [facility] if: BG persistently < 70  I reviewed the Rule of 15 for the treatment of hypoglycemia in detail with the patient. Literature supplied.    2) Diabetic complications:  Eye: Does not have known diabetic retinopathy.  Neuro/ Feet: Does not have known diabetic peripheral neuropathy .  Renal: Patient does not have known baseline CKD. She   is not on an ACEI/ARB at present.  MA/CR.normal   3) Dyslipidemia: Patient is on crestor 5 mg, taking half a tablet , this is all she could tolerate due to arthralgia. She is already on vitamin D.     F/U in 4 months   Signed electronically by: Mack Guise, MD  [facility]  [facility] [address] [place], [place] [address] Phone: [phone] FAX: [phone]   CC: Mosie Lukes, [address] [place] [address] Phone: [phone]  Fax: [phone]  Return to Endocrinology clinic as below: Future Appointments  Date Time Provider [place]  05/03/2022 10:20 AM Mosie Lukes, MD [facility]  08/03/2022 10:00 AM Mosie Lukes, MD [facility]  08/06/2022 10:50 AM Marabeth Melland, Melanie Crazier, MD [facility] None  10/15/2022  9:40 AM [facility] CO[facility]

## 2022-02-03 ENCOUNTER — Encounter: Payer: Self-pay | Admitting: Internal Medicine

## 2022-02-03 ENCOUNTER — Ambulatory Visit: Payer: HMO | Admitting: Internal Medicine

## 2022-02-03 ENCOUNTER — Other Ambulatory Visit (HOSPITAL_BASED_OUTPATIENT_CLINIC_OR_DEPARTMENT_OTHER): Payer: Self-pay

## 2022-02-03 VITALS — BP 126/74 | HR 71 | Ht 63.0 in | Wt 188.0 lb

## 2022-02-03 DIAGNOSIS — E1165 Type 2 diabetes mellitus with hyperglycemia: Secondary | ICD-10-CM

## 2022-02-03 MED ORDER — REPAGLINIDE 0.5 MG PO TABS
0.5000 mg | ORAL_TABLET | Freq: Two times a day (BID) | ORAL | 3 refills | Status: DC
  Filled 2022-02-03: qty 100, 50d supply, fill #0
  Filled 2022-02-05: qty 80, 40d supply, fill #0

## 2022-02-03 MED ORDER — METFORMIN HCL 1000 MG PO TABS
1000.0000 mg | ORAL_TABLET | Freq: Two times a day (BID) | ORAL | 3 refills | Status: DC
  Filled 2022-04-28: qty 180, 90d supply, fill #0
  Filled 2022-07-28: qty 180, 90d supply, fill #1

## 2022-02-03 NOTE — Patient Instructions (Signed)
-   Stop Glipizide  - Start Repaglinide 0.5 mg, 1 tablet before Breakfast and Supper  - Continue Metformin 1000 mg , 1 tablet with breakfast and 1 tablet with dinner   Check with insurance about Farxiga/Jardiance coverage   HOW TO TREAT LOW BLOOD SUGARS (Blood sugar LESS THAN 70 MG/DL) Please follow the RULE OF 15 for the treatment of hypoglycemia treatment (when your (blood sugars are less than 70 mg/dL)   STEP 1: Take 15 grams of carbohydrates when your blood sugar is low, which includes:  3-4 GLUCOSE TABS  OR 3-4 OZ OF JUICE OR REGULAR SODA OR ONE TUBE OF GLUCOSE GEL    STEP 2: RECHECK blood sugar in 15 MINUTES STEP 3: If your blood sugar is still low at the 15 minute recheck --> then, go back to STEP 1 and treat AGAIN with another 15 grams of carbohydrates.

## 2022-02-04 ENCOUNTER — Other Ambulatory Visit (HOSPITAL_BASED_OUTPATIENT_CLINIC_OR_DEPARTMENT_OTHER): Payer: Self-pay

## 2022-02-05 ENCOUNTER — Other Ambulatory Visit (HOSPITAL_BASED_OUTPATIENT_CLINIC_OR_DEPARTMENT_OTHER): Payer: Self-pay

## 2022-02-08 ENCOUNTER — Telehealth: Payer: Self-pay | Admitting: Family Medicine

## 2022-02-08 ENCOUNTER — Other Ambulatory Visit (HOSPITAL_BASED_OUTPATIENT_CLINIC_OR_DEPARTMENT_OTHER): Payer: Self-pay

## 2022-02-08 NOTE — Telephone Encounter (Signed)
Patient states she called [facility], for her mammogram that was sent on 08/17 but they stated they had not received it. They would like for the order to be faxed again to [phone].

## 2022-02-16 ENCOUNTER — Other Ambulatory Visit: Payer: Self-pay | Admitting: Family Medicine

## 2022-02-16 ENCOUNTER — Other Ambulatory Visit (HOSPITAL_BASED_OUTPATIENT_CLINIC_OR_DEPARTMENT_OTHER): Payer: Self-pay

## 2022-02-16 DIAGNOSIS — Z1231 Encounter for screening mammogram for malignant neoplasm of breast: Secondary | ICD-10-CM

## 2022-02-23 DIAGNOSIS — N958 Other specified menopausal and perimenopausal disorders: Secondary | ICD-10-CM | POA: Diagnosis not present

## 2022-02-23 DIAGNOSIS — Z13820 Encounter for screening for osteoporosis: Secondary | ICD-10-CM | POA: Diagnosis not present

## 2022-02-23 LAB — HM DEXA SCAN: HM Dexa Scan: NORMAL

## 2022-03-08 ENCOUNTER — Ambulatory Visit
Admission: RE | Admit: 2022-03-08 | Discharge: 2022-03-08 | Disposition: A | Discharge status: Home / Self Care | Payer: HMO | Source: Ambulatory Visit | Attending: Family Medicine | Admitting: Family Medicine

## 2022-03-08 DIAGNOSIS — Z1231 Encounter for screening mammogram for malignant neoplasm of breast: Secondary | ICD-10-CM

## 2022-03-11 ENCOUNTER — Other Ambulatory Visit (HOSPITAL_BASED_OUTPATIENT_CLINIC_OR_DEPARTMENT_OTHER): Payer: Self-pay

## 2022-03-11 ENCOUNTER — Other Ambulatory Visit: Payer: Self-pay | Admitting: Family Medicine

## 2022-03-11 MED ORDER — ROSUVASTATIN CALCIUM 5 MG PO TABS
5.0000 mg | ORAL_TABLET | Freq: Every day | ORAL | 1 refills | Status: DC
  Filled 2022-03-11: qty 90, 90d supply, fill #0
  Filled 2022-06-10: qty 90, 90d supply, fill #1

## 2022-03-12 ENCOUNTER — Other Ambulatory Visit (HOSPITAL_BASED_OUTPATIENT_CLINIC_OR_DEPARTMENT_OTHER): Payer: Self-pay

## 2022-03-12 MED ORDER — REPAGLINIDE 0.5 MG PO TABS
ORAL_TABLET | ORAL | 3 refills | Status: DC
  Filled 2022-03-12 – 2022-04-12 (×3): qty 270, 90d supply, fill #0
  Filled 2022-06-15 – 2022-07-19 (×2): qty 270, 90d supply, fill #1

## 2022-03-25 ENCOUNTER — Telehealth: Payer: Self-pay | Admitting: Family Medicine

## 2022-03-25 ENCOUNTER — Other Ambulatory Visit: Payer: Self-pay

## 2022-03-25 ENCOUNTER — Other Ambulatory Visit (HOSPITAL_BASED_OUTPATIENT_CLINIC_OR_DEPARTMENT_OTHER): Payer: Self-pay

## 2022-03-25 MED ORDER — FREESTYLE LIBRE 3 SENSOR MISC
1.0000 | 5 refills | Status: DC
  Filled 2022-03-25: qty 2, 28d supply, fill #0
  Filled 2022-04-28: qty 2, 28d supply, fill #1
  Filled 2022-04-28 (×2): qty 1, 14d supply, fill #1
  Filled 2022-05-28: qty 2, 28d supply, fill #2
  Filled 2022-09-06: qty 2, 28d supply, fill #3
  Filled 2022-10-08: qty 2, 28d supply, fill #4
  Filled 2022-11-04: qty 2, 28d supply, fill #5

## 2022-03-25 NOTE — Telephone Encounter (Signed)
Chasity- Nurse Case Mgr with [employer], calling to advise patient has requested a [employer]. Case manager said she believes there is a newer version out-she believes it's the [employer] 3. Patient uses [facility]

## 2022-03-25 NOTE — Telephone Encounter (Signed)
Freestyle libre 3 sent in

## 2022-03-29 ENCOUNTER — Other Ambulatory Visit (HOSPITAL_BASED_OUTPATIENT_CLINIC_OR_DEPARTMENT_OTHER): Payer: Self-pay

## 2022-03-29 ENCOUNTER — Other Ambulatory Visit: Payer: Self-pay | Admitting: Family Medicine

## 2022-03-29 MED ORDER — BISOPROLOL-HYDROCHLOROTHIAZIDE 5-6.25 MG PO TABS
1.0000 | ORAL_TABLET | Freq: Every day | ORAL | 1 refills | Status: DC
  Filled 2022-03-29: qty 90, 90d supply, fill #0
  Filled 2022-06-15: qty 90, 90d supply, fill #1

## 2022-04-12 ENCOUNTER — Other Ambulatory Visit (HOSPITAL_BASED_OUTPATIENT_CLINIC_OR_DEPARTMENT_OTHER): Payer: Self-pay

## 2022-04-13 ENCOUNTER — Other Ambulatory Visit (HOSPITAL_BASED_OUTPATIENT_CLINIC_OR_DEPARTMENT_OTHER): Payer: Self-pay

## 2022-04-14 ENCOUNTER — Other Ambulatory Visit (HOSPITAL_BASED_OUTPATIENT_CLINIC_OR_DEPARTMENT_OTHER): Payer: Self-pay

## 2022-04-28 ENCOUNTER — Other Ambulatory Visit (HOSPITAL_BASED_OUTPATIENT_CLINIC_OR_DEPARTMENT_OTHER): Payer: Self-pay

## 2022-05-03 ENCOUNTER — Ambulatory Visit: Payer: HMO | Admitting: Family Medicine

## 2022-05-04 ENCOUNTER — Ambulatory Visit: Payer: HMO | Admitting: Family Medicine

## 2022-05-10 ENCOUNTER — Ambulatory Visit (INDEPENDENT_AMBULATORY_CARE_PROVIDER_SITE_OTHER): Payer: HMO | Admitting: Family

## 2022-05-10 ENCOUNTER — Telehealth: Payer: Self-pay | Admitting: Family

## 2022-05-10 VITALS — BP 116/68 | HR 70 | Temp 98.2°F | Resp 16 | Wt 186.0 lb

## 2022-05-10 DIAGNOSIS — G4700 Insomnia, unspecified: Secondary | ICD-10-CM

## 2022-05-10 DIAGNOSIS — E669 Obesity, unspecified: Secondary | ICD-10-CM

## 2022-05-10 DIAGNOSIS — E785 Hyperlipidemia, unspecified: Secondary | ICD-10-CM

## 2022-05-10 DIAGNOSIS — E1165 Type 2 diabetes mellitus with hyperglycemia: Secondary | ICD-10-CM

## 2022-05-10 DIAGNOSIS — N644 Mastodynia: Secondary | ICD-10-CM

## 2022-05-10 DIAGNOSIS — F418 Other specified anxiety disorders: Secondary | ICD-10-CM

## 2022-05-10 DIAGNOSIS — D0512 Intraductal carcinoma in situ of left breast: Secondary | ICD-10-CM

## 2022-05-10 LAB — COMPREHENSIVE METABOLIC PANEL
ALT: 24 U/L (ref 0–35)
AST: 18 U/L (ref 0–37)
Albumin: 4 g/dL (ref 3.5–5.2)
Alkaline Phosphatase: 77 U/L (ref 39–117)
BUN: 19 mg/dL (ref 6–23)
CO2: 26 mEq/L (ref 19–32)
Calcium: 9.4 mg/dL (ref 8.4–10.5)
Chloride: 102 mEq/L (ref 96–112)
Creatinine, Ser: 1.02 mg/dL (ref 0.40–1.20)
GFR: 56.07 mL/min — ABNORMAL LOW (ref >60.00)
Glucose, Bld: 164 mg/dL — ABNORMAL HIGH (ref 70–99)
Potassium: 3.8 mEq/L (ref 3.5–5.1)
Sodium: 139 mEq/L (ref 135–145)
Total Bilirubin: 0.3 mg/dL (ref 0.2–1.2)
Total Protein: 6.7 g/dL (ref 6.0–8.3)

## 2022-05-10 LAB — MICROALBUMIN / CREATININE URINE RATIO
Creatinine,U: 127.8 mg/dL
Microalb Creat Ratio: 0.5 mg/g (ref 0.0–30.0)
Microalb, Ur: <0.7 mg/dL (ref 0.0–1.9)

## 2022-05-10 LAB — LIPID PANEL
Cholesterol: 154 mg/dL (ref 0–200)
HDL: 43.8 mg/dL (ref >39.00)
LDL Cholesterol: 83 mg/dL (ref 0–99)
NonHDL: 110.65
Total CHOL/HDL Ratio: 4
Triglycerides: 136 mg/dL (ref 0.0–149.0)
VLDL: 27.2 mg/dL (ref 0.0–40.0)

## 2022-05-10 LAB — HEMOGLOBIN A1C: Hgb A1c MFr Bld: 8.3 % — ABNORMAL HIGH (ref 4.6–6.5)

## 2022-05-10 NOTE — Assessment & Plan Note (Signed)
Wt Readings from Last 3 Encounters:  05/10/22 186 lb (84.4 kg)  02/03/22 188 lb (85.3 kg)  01/28/22 185 lb 6.4 oz (84.1 kg)   Continues work on diet. Limited exercise.

## 2022-05-10 NOTE — Assessment & Plan Note (Signed)
Lab Results  Component Value Date   HGBA1C 7.7 (H) 01/28/2022   HGBA1C 7.6 (A) 07/21/2021   HGBA1C 6.9 (A) 12/09/2020   Lab Results  Component Value Date   MICROALBUR <0.7 12/09/2020   LDLCALC 69 01/28/2022   CREATININE 1.00 01/28/2022   Reports that her sugars have been overall stable. She reports 4-5 times since last visit, lowest 69. Maintained on prandin and metformin.

## 2022-05-10 NOTE — Assessment & Plan Note (Signed)
Negative mammogram 9/23.

## 2022-05-10 NOTE — Progress Notes (Signed)
Subjective:   By signing my name below, I, Luna Glasgow, attest that this documentation has been prepared under the direction and in the presence of Debbrah Alar, 05/10/2022.   Patient ID: [name], female    DOB: [birth year], 71 y.o.   MRN: [id number]  Chief Complaint  Patient presents with   Diabetes    Here for follow up   Hypertension    Here for follow up   Skin Discoloration    Complains of skin color change left leg    HPI Patient is in today for an office visit.  Skin discoloration Patient is complaining of skin discoloration on her left calf.   Hypertension Patients blood pressure is normal this visit. She is complaint with 5- 6.25 mg Ziac. BP Readings from Last 3 Encounters:  05/10/22 116/68  02/03/22 126/74  01/28/22 120/72   Pulse Readings from Last 3 Encounters:  05/10/22 70  02/03/22 71  01/28/22 68   Diabetes Patient reports that she regularly checks her blood sugar levels at home. She states that she gets low readings around 4-5 times with readings around 69, but the finger stick readings are around 80. She regularly sees her endocrinologist, Dr. Kelton Pillar, for her diabetes.    Insomnia  Patient reports that she is sleeping well with melatonin and 400 mg magnesium.  Anxiety and Depression Patient reports that she is in a stable mood. She states that her anxiety and depression episodes are more situational.  Hyperlipidemia Patient reports that she is complaint with 5 mg Crestor.   Immunizations She is requesting to have an influenza vaccine and pneumonia vaccine next office visit. Health Maintenance Due  Topic Date Due   Zoster Vaccines- Shingrix (1 of 2) Never done   Diabetic kidney evaluation - Urine ACR  12/09/2021   OPHTHALMOLOGY EXAM  01/09/2022   INFLUENZA VACCINE  01/12/2022   Pneumonia Vaccine 71+ Years old (3 - PPSV23 or PCV20) 01/29/2022   COVID-19 Vaccine (5 - 2023-24 season) 02/12/2022    Past Medical History:   Diagnosis Date   Acute bronchitis 10/31/2015   Allergy    seasonal   Arthritis    knee-left   Cancer ([place]) 04/2018   right breast ca   Cataract    right eye   Diabetes mellitus 52   type 2   Diaphragmatic hernia without mention of obstruction or gangrene    Endometriosis    Esophageal reflux    Esophageal stricture    GERD (gastroesophageal reflux disease)    HOCM (hypertrophic obstructive cardiomyopathy) (HCC)    Basal septal hypertrophy 16 mm with increased ECV at 30% on cardiac MRI 06/2021 consistent with HOCM   Hyperlipidemia    Hyperplastic colon polyp    Hypertension 52   Nonalcoholic fatty liver disease 04/28/2010   Qualifier: Diagnosis of  By: Arnoldo Morale MD, John E    Nonspecific abnormal results of liver function study    NSVT (nonsustained ventricular tachycardia) ([place])    noted on event monitor 05/2021   Personal history of radiation therapy 08/2018   bilateral breasts   Pharyngitis 02/18/2012   Plantar fascial fibromatosis    Preventative health care 01/22/2013   Seasonal allergies 10/31/2015   Sleep apnea    has oral appliance but does not fit well due to missing teeth   Sore throat 04/03/2017   Tubular adenoma of colon    Unspecified sleep apnea    no c-pap    Past Surgical History:  Procedure Laterality  Date   ABDOMINAL HYSTERECTOMY  1982   partial   APPENDECTOMY     benigh cyst in lung  2006   right   BREAST BIOPSY Bilateral 04/03/2018   BREAST BIOPSY Bilateral 03/23/2018   BREAST CYST INCISION AND DRAINAGE     right breast   BREAST EXCISIONAL BIOPSY Right 2019   BREAST LUMPECTOMY Bilateral 04/2018   BREAST LUMPECTOMY WITH RADIOACTIVE SEED LOCALIZATION Bilateral 05/02/2018   Procedure: RIGHT BREAST SEED LUMPECTOMY X2 AND LEFT BREAST SEED GUIDED LUMPECTOMY;  Surgeon: [name], MD;  Location: [facility];  Service: General;  Laterality: Bilateral;   CATARACT EXTRACTION     right eye   HERNIA REPAIR     per pt, she is not aware  of this surgery   RE-EXCISION OF BREAST LUMPECTOMY Right 05/25/2018   Procedure: RE-EXCISION OF RIGHT BREAST LUMPECTOMY;  Surgeon: [name], MD;  Location: [facility];  Service: General;  Laterality: Right;   seba     TUBAL LIGATION      Family History  Problem Relation Age of Onset   Breast cancer Mother    Hypertension Mother    Diabetes Mother        type 2   Other Mother        esophagus surgery   Heart disease Father    Hyperlipidemia Father    Hypertension Father    Heart attack Father        MI at age 1   Diabetes Brother        type 2   Heart disease Maternal Grandmother    Heart attack Maternal Grandfather    Hypertension Maternal Grandfather    Stroke Paternal Grandmother    Hypertension Sister    Diabetes Sister    Hypertension Brother    Alcohol abuse Maternal Aunt    Cancer Maternal Aunt    Colon cancer Neg Hx    Stomach cancer Neg Hx    Esophageal cancer Neg Hx     Social History   Socioeconomic History   Marital status: Married    Spouse name: English as a second language teacher   Number of children: 2   Years of education: Not on file   Highest education level: Not on file  Occupational History   Occupation: Building surveyor: [employer]  Tobacco Use   Smoking status: Never   Smokeless tobacco: Never  Substance and Sexual Activity   Alcohol use: No    Alcohol/week: 0.0 standard drinks of alcohol   Drug use: No   Sexual activity: Yes    Birth control/protection: Post-menopausal    Comment: lives with husband, no dietary restrictions just watching carbs, wears seat belt  Other Topics Concern   Not on file  Social History Narrative   Not on file   Social Determinants of Health   Financial Resource Strain: Low Risk  (10/13/2021)   Overall Financial Resource Strain (CARDIA)    Difficulty of Paying Living Expenses: Not hard at all  Food Insecurity: No Food Insecurity (10/13/2021)   Hunger Vital Sign    Worried About Running Out of Food in  the Last Year: Never true    Ran Out of Food in the Last Year: Never true  Transportation Needs: No Transportation Needs (10/13/2021)   PRAPARE - Hydrologist (Medical): No    Lack of Transportation (Non-Medical): No  Physical Activity: Not on file  Stress: No Stress Concern Present (10/13/2021)  Barber Questionnaire    Feeling of Stress : Not at all  Social Connections: Socially Integrated (10/13/2021)   Social Connection and Isolation Panel [NHANES]    Frequency of Communication with Friends and Family: More than three times a week    Frequency of Social Gatherings with Friends and Family: More than three times a week    Attends Religious Services: More than 4 times per year    Active Member of [employer] or Organizations: Yes    Attends Music therapist: More than 4 times per year    Marital Status: Married  Human resources officer Violence: Not At Risk (06/22/2018)   Humiliation, Afraid, Rape, and Kick questionnaire    Fear of Current or Ex-Partner: No    Emotionally Abused: No    Physically Abused: No    Sexually Abused: No    Outpatient Medications Prior to Visit  Medication Sig Dispense Refill   albuterol (VENTOLIN HFA) 108 (90 Base) MCG/ACT inhaler Inhale 2 puffs by mouth into the lungs every 6 (six) hours as needed for wheezing or shortness of breath. 18 g 0   ASPIR-LOW 81 MG EC tablet TAKE 1 TABLET BY MOUTH DAILY 30 tablet 3   bisoprolol-hydrochlorothiazide (ZIAC) 5-6.25 MG tablet Take 1 tablet by mouth daily. 90 tablet 1   Continuous Blood Gluc Sensor (FREESTYLE LIBRE 3 SENSOR) MISC Inject 1 sensor into the skin every 14 (fourteen) days. Use to check blood glucose continuously. 2 each 5   fexofenadine (SM FEXOFENADINE HCL) 180 MG tablet Take 1 tablet (180 mg total) by mouth daily. 30 tablet 1   fluticasone (FLONASE) 50 MCG/ACT nasal spray Place 2 sprays into both nostrils daily. 16 g 5   glucose  blood (ONETOUCH VERIO) test strip USE AS INSTRUCTED TWICE A DAY 100 strip 12   glucose blood test strip OneTouch Verio test strips     Magnesium Oxide 400 MG CAPS Take 1 capsule (400 mg total) by mouth daily. 90 capsule 3   MELATONIN PO Take 10 mg by mouth at bedtime.     metFORMIN (GLUCOPHAGE) 1000 MG tablet Take 1 tablet (1,000 mg total) by mouth 2 (two) times daily with a meal. 180 tablet 3   montelukast (SINGULAIR) 10 MG tablet Take 1 tablet (10 mg total) by mouth at bedtime as needed. 30 tablet 3   omeprazole (PRILOSEC) 40 MG capsule TAKE 1 CAPSULE BY MOUTH TWICE DAILY 180 capsule 1   repaglinide (PRANDIN) 0.5 MG tablet Take 1 tablet (0.5 mg total) by mouth daily before breakfast AND 2 tablets (1 mg total) daily before supper. 270 tablet 3   rosuvastatin (CRESTOR) 5 MG tablet Take 1 tablet (5 mg total) by mouth daily. 90 tablet 1   traZODone (DESYREL) 50 MG tablet Take 1/2 - 1 tablet (25-50 mg total) by mouth at bedtime as needed for sleep. 30 tablet 3   Facility-Administered Medications Prior to Visit  Medication Dose Route Frequency Provider Last Rate Last Admin   Chlorhexidine Gluconate Cloth 2 % PADS 6 each  6 each Topical Once [name], MD       And   Chlorhexidine Gluconate Cloth 2 % PADS 6 each  6 each Topical Once [name], MD        Allergies  Allergen Reactions   Pravastatin Other (See Comments)    Myalgia, fatigue, memory loss   Amoxicillin Itching    REACTION: rash   Atorvastatin Other (See Comments)  Myalgia, fatigue, memory loss   Codeine Nausea And Vomiting    ROS    See HPI Objective:    Physical Exam Constitutional:      General: She is not in acute distress.    Appearance: Normal appearance. She is not ill-appearing.  HENT:     Head: Normocephalic and atraumatic.     Right Ear: External ear normal.     Left Ear: External ear normal.  Eyes:     Extraocular Movements: Extraocular movements intact.     Pupils: Pupils are equal, round, and  reactive to light.  Cardiovascular:     Rate and Rhythm: Normal rate and regular rhythm.     Heart sounds: Normal heart sounds. No murmur heard.    No gallop.  Pulmonary:     Effort: Pulmonary effort is normal. No respiratory distress.     Breath sounds: Normal breath sounds. No wheezing or rales.  Skin:    General: Skin is warm and dry.  Neurological:     Mental Status: She is alert and oriented to person, place, and time.  Psychiatric:        Mood and Affect: Mood normal.        Behavior: Behavior normal.        Judgment: Judgment normal.     BP 116/68 (BP Location: Right Arm, Patient Position: Sitting, Cuff Size: Small)   Pulse 70   Temp 98.2 F (36.8 C) (Oral)   Resp 16   Wt 186 lb (84.4 kg)   SpO2 100%   BMI 32.95 kg/m  Wt Readings from Last 3 Encounters:  05/10/22 186 lb (84.4 kg)  02/03/22 188 lb (85.3 kg)  01/28/22 185 lb 6.4 oz (84.1 kg)       Assessment & Plan:   Problem List Items Addressed This Visit       Unprioritized   Type 2 diabetes mellitus with hyperglycemia, without long-term current use of insulin (HCC) - Primary    Lab Results  Component Value Date   HGBA1C 7.7 (H) 01/28/2022   HGBA1C 7.6 (A) 07/21/2021   HGBA1C 6.9 (A) 12/09/2020   Lab Results  Component Value Date   MICROALBUR <0.7 12/09/2020   [place] 69 01/28/2022   CREATININE 1.00 01/28/2022  Reports that her sugars have been overall stable. She reports 4-5 times since last visit, lowest 69. Maintained on prandin and metformin.       Relevant Orders   Urine Microalbumin w/creat. ratio   Comp Met (CMET)   Hemoglobin A1c   Obesity (BMI 30-39.9)    Wt Readings from Last 3 Encounters:  05/10/22 186 lb (84.4 kg)  02/03/22 188 lb (85.3 kg)  01/28/22 185 lb 6.4 oz (84.1 kg)  Continues work on diet. Limited exercise.       Insomnia    Overall sleep is stable. She continues melatonin and magnesium which is helpful.        Ductal carcinoma in situ (DCIS) of left breast     Negative mammogram 9/23.       Depression with anxiety    Reports overall stable- has some situational stressor.       RESOLVED: Breast pain, right    Resolved.       Other Visit Diagnoses     Hyperlipidemia, unspecified hyperlipidemia type       Relevant Orders   Lipid panel      No orders of the defined types were placed in this encounter.   I,  Debbrah Alar, personally preformed the services described in this documentation.  All medical record entries made by the scribe were at my direction and in my presence.  I have reviewed the chart and discharge instructions (if applicable) and agree that the record reflects my personal performance and is accurate and complete. 05/10/2022.   I,Verona Buck,acting as a Education administrator for [employer], NP.,have documented all relevant documentation on the behalf of Nance Pear, NP,as directed by  Nance Pear, NP while in the presence of Nance Pear, NP.    Nance Pear, NP

## 2022-05-10 NOTE — Assessment & Plan Note (Signed)
Reports overall stable- has some situational stressor.

## 2022-05-10 NOTE — Telephone Encounter (Signed)
Records release faxed 

## 2022-05-10 NOTE — Assessment & Plan Note (Signed)
Overall sleep is stable. She continues melatonin and magnesium which is helpful.

## 2022-05-10 NOTE — Assessment & Plan Note (Signed)
Resolved

## 2022-05-10 NOTE — Telephone Encounter (Signed)
Please call Dr. Valetta Close in [facility] and request copy of last DM eye exam.

## 2022-05-28 ENCOUNTER — Other Ambulatory Visit (HOSPITAL_BASED_OUTPATIENT_CLINIC_OR_DEPARTMENT_OTHER): Payer: Self-pay

## 2022-05-31 ENCOUNTER — Other Ambulatory Visit (HOSPITAL_BASED_OUTPATIENT_CLINIC_OR_DEPARTMENT_OTHER): Payer: Self-pay

## 2022-05-31 DIAGNOSIS — H8110 Benign paroxysmal vertigo, unspecified ear: Secondary | ICD-10-CM | POA: Insufficient documentation

## 2022-05-31 DIAGNOSIS — J309 Allergic rhinitis, unspecified: Secondary | ICD-10-CM | POA: Insufficient documentation

## 2022-06-01 ENCOUNTER — Other Ambulatory Visit (HOSPITAL_BASED_OUTPATIENT_CLINIC_OR_DEPARTMENT_OTHER): Payer: Self-pay

## 2022-06-02 ENCOUNTER — Ambulatory Visit (INDEPENDENT_AMBULATORY_CARE_PROVIDER_SITE_OTHER): Payer: HMO

## 2022-06-02 DIAGNOSIS — Z23 Encounter for immunization: Secondary | ICD-10-CM

## 2022-06-02 NOTE — Progress Notes (Signed)
Pt here for High dose flu and Prevnar 20 per Melissa from the office visit on 05/10/2022. Flu given in left arm and Prevnar given in right arm. Pt handled well.

## 2022-06-08 ENCOUNTER — Other Ambulatory Visit (HOSPITAL_BASED_OUTPATIENT_CLINIC_OR_DEPARTMENT_OTHER): Payer: Self-pay

## 2022-06-15 ENCOUNTER — Other Ambulatory Visit (HOSPITAL_BASED_OUTPATIENT_CLINIC_OR_DEPARTMENT_OTHER): Payer: Self-pay

## 2022-06-15 ENCOUNTER — Other Ambulatory Visit: Payer: Self-pay | Admitting: Physician Assistant

## 2022-06-15 MED ORDER — OMEPRAZOLE 40 MG PO CPDR
40.0000 mg | DELAYED_RELEASE_CAPSULE | Freq: Two times a day (BID) | ORAL | 1 refills | Status: DC
  Filled 2022-06-15: qty 180, 90d supply, fill #0
  Filled 2022-09-06: qty 180, 90d supply, fill #1

## 2022-06-16 ENCOUNTER — Ambulatory Visit (INDEPENDENT_AMBULATORY_CARE_PROVIDER_SITE_OTHER): Payer: HMO

## 2022-06-16 ENCOUNTER — Ambulatory Visit
Admission: EM | Admit: 2022-06-16 | Discharge: 2022-06-16 | Disposition: A | Discharge status: Home / Self Care | Payer: HMO | Attending: Family Medicine | Admitting: Family Medicine

## 2022-06-16 ENCOUNTER — Telehealth: Payer: Self-pay | Admitting: Family Medicine

## 2022-06-16 ENCOUNTER — Encounter: Payer: Self-pay | Admitting: Emergency Medicine

## 2022-06-16 DIAGNOSIS — N644 Mastodynia: Secondary | ICD-10-CM

## 2022-06-16 DIAGNOSIS — R079 Chest pain, unspecified: Secondary | ICD-10-CM | POA: Diagnosis not present

## 2022-06-16 NOTE — Discharge Instructions (Addendum)
Advised patient chest x-ray was clear.  Advised/encouraged patient to follow-up with PCP and/or oncology for further evaluation of left breast to include mammogram and or advanced imaging.

## 2022-06-16 NOTE — ED Provider Notes (Addendum)
[name]    CSN: [id number] Arrival date & time: 06/16/22  1343      History   Chief Complaint Chief Complaint  Patient presents with   Breast Pain    HPI [name] is a 73 y.o. female.   HPI 72 year old female presents with left breast pain for 2 weeks.  Reports no redness, drainage or swelling patient reports getting pneumonia vaccine and influenza vaccine at the same time and noticed pain of her left breast shortly thereafter.  PMH significant for obesity right breast cancer and ductal carcinoma in situ of the left breast.  Past Medical History:  Diagnosis Date   Acute bronchitis 10/31/2015   Allergy    seasonal   Arthritis    knee-left   Cancer ([place]) 04/2018   right breast ca   Cataract    right eye   Diabetes mellitus 52   type 2   Diaphragmatic hernia without mention of obstruction or gangrene    Endometriosis    Esophageal reflux    Esophageal stricture    GERD (gastroesophageal reflux disease)    HOCM (hypertrophic obstructive cardiomyopathy) (HCC)    Basal septal hypertrophy 16 mm with increased ECV at 30% on cardiac MRI 06/2021 consistent with HOCM   Hyperlipidemia    Hyperplastic colon polyp    Hypertension 52   Nonalcoholic fatty liver disease 04/28/2010   Qualifier: Diagnosis of  By: Arnoldo Morale MD, John E    Nonspecific abnormal results of liver function study    NSVT (nonsustained ventricular tachycardia) ([place])    noted on event monitor 05/2021   Personal history of radiation therapy 08/2018   bilateral breasts   Pharyngitis 02/18/2012   Plantar fascial fibromatosis    Preventative health care 01/22/2013   Seasonal allergies 10/31/2015   Sleep apnea    has oral appliance but does not fit well due to missing teeth   Sore throat 04/03/2017   Tubular adenoma of colon    Unspecified sleep apnea    no c-pap    Patient Active Problem List   Diagnosis Date Noted   Constipation 01/29/2022   Chronic sinusitis 01/29/2022   HOCM  (hypertrophic obstructive cardiomyopathy) ([place]) 06/26/2021   Heart palpitations 02/01/2021   Elevated lipase 02/01/2021   Need for pneumococcal vaccination 02/01/2021   Diabetes mellitus without complication ([place]) 93/71/6967   Insomnia 07/23/2019   Shingles 02/18/2019   Ductal carcinoma in situ (DCIS) of left breast 07/03/2018   Vertigo 03/13/2018   Ductal carcinoma in situ (DCIS) of right breast 03/10/2018   Right shoulder pain 10/03/2017   Headache 06/30/2017   Obesity (BMI 30-39.9) 05/02/2017   Seasonal allergies 10/31/2015   Hx of adenomatous colonic polyps 04/30/2014   Cervical cancer screening 01/22/2013   Preventative health care 01/22/2013   Chest pain 01/06/2012   Endometriosis    Chronic cough 89/38/1017   Nonalcoholic fatty liver disease 04/28/2010   OSA (obstructive sleep apnea) 03/26/2009   RT BUNDLE BRANCH BLOCK&LT ANT FASCICULAR BLOCK 12/20/2008   Thoracic back pain 10/10/2008   Osteoarthritis 07/05/2008   Abdominal pain 07/05/2008   Aneurysm, thoracoabdominal ([place]) 03/22/2007   Type 2 diabetes mellitus with hyperglycemia, without long-term current use of insulin ([place]) 10/28/2006   Hyperlipidemia, mixed 10/28/2006   Depression with anxiety 10/28/2006   Essential hypertension 10/28/2006   ESOPHAGEAL STRICTURE 05/04/2004   HIATAL HERNIA 05/04/2004    Past Surgical History:  Procedure Laterality Date   ABDOMINAL HYSTERECTOMY  1982   partial  APPENDECTOMY     benigh cyst in lung  2006   right   BREAST BIOPSY Bilateral 04/03/2018   BREAST BIOPSY Bilateral 03/23/2018   BREAST CYST INCISION AND DRAINAGE     right breast   BREAST EXCISIONAL BIOPSY Right 2019   BREAST LUMPECTOMY Bilateral 04/2018   BREAST LUMPECTOMY WITH RADIOACTIVE SEED LOCALIZATION Bilateral 05/02/2018   Procedure: RIGHT BREAST SEED LUMPECTOMY X2 AND LEFT BREAST SEED GUIDED LUMPECTOMY;  Surgeon: [name], MD;  Location: [place];  Service: General;  Laterality:  Bilateral;   CATARACT EXTRACTION     right eye   HERNIA REPAIR     per pt, she is not aware of this surgery   RE-EXCISION OF BREAST LUMPECTOMY Right 05/25/2018   Procedure: RE-EXCISION OF RIGHT BREAST LUMPECTOMY;  Surgeon: [name], MD;  Location: [place];  Service: General;  Laterality: Right;   seba     TUBAL LIGATION      OB History   No obstetric history on file.      Home Medications    Prior to Admission medications   Medication Sig Start Date End Date Taking? Authorizing Provider  albuterol (VENTOLIN HFA) 108 (90 Base) MCG/ACT inhaler Inhale 2 puffs by mouth into the lungs every 6 (six) hours as needed for wheezing or shortness of breath. 04/06/21  Yes Jeanie Sewer, NP  ASPIR-LOW 81 MG EC tablet TAKE 1 TABLET BY MOUTH DAILY 01/16/15  Yes Mosie Lukes, MD  bisoprolol-hydrochlorothiazide [facility]) 5-6.25 MG tablet Take 1 tablet by mouth daily. 03/29/22  Yes Mosie Lukes, MD  Continuous Blood Gluc Sensor (FREESTYLE LIBRE 3 SENSOR) MISC Inject 1 sensor into the skin every 14 (fourteen) days. Use to check blood glucose continuously. 03/25/22  Yes Mosie Lukes, MD  fexofenadine (SM FEXOFENADINE HCL) 180 MG tablet Take 1 tablet (180 mg total) by mouth daily. 12/25/21  Yes Mosie Lukes, MD  fluticasone (FLONASE) 50 MCG/ACT nasal spray Place 2 sprays into both nostrils daily. 09/30/21  Yes Mosie Lukes, MD  glucose blood test strip OneTouch Verio test strips   Yes [provider]  Magnesium Oxide 400 MG CAPS Take 1 capsule (400 mg total) by mouth daily. 06/04/21  Yes Turner, Eber Hong, MD  MELATONIN PO Take 10 mg by mouth at bedtime.   Yes [provider]  metFORMIN (GLUCOPHAGE) 1000 MG tablet Take 1 tablet (1,000 mg total) by mouth 2 (two) times daily with a meal. 02/03/22 02/03/23 Yes Shamleffer, Melanie Crazier, MD  montelukast (SINGULAIR) 10 MG tablet Take 1 tablet (10 mg total) by mouth at bedtime as needed. 11/16/18  Yes Mosie Lukes, MD  omeprazole (PRILOSEC) 40 MG capsule Take 1 capsule (40 mg total) by mouth 2 (two) times daily. 06/15/22 06/15/23 Yes Esterwood, Amy S, PA-C  repaglinide (PRANDIN) 0.5 MG tablet Take 1 tablet (0.5 mg total) by mouth daily before breakfast AND 2 tablets (1 mg total) daily before supper. 03/12/22  Yes Shamleffer, Melanie Crazier, MD  rosuvastatin (CRESTOR) 5 MG tablet Take 1 tablet (5 mg total) by mouth daily. 03/11/22  Yes Mosie Lukes, MD  traZODone (DESYREL) 50 MG tablet Take 1/2 - 1 tablet (25-50 mg total) by mouth at bedtime as needed for sleep. 07/30/21  Yes Marrian Salvage, FNP  glucose blood [facility] VERIO) test strip USE AS INSTRUCTED TWICE A DAY 09/03/21 09/03/22  Shamleffer, Melanie Crazier, MD    Family History Family History  Problem Relation Age  of Onset   Breast cancer Mother    Hypertension Mother    Diabetes Mother        type 2   Other Mother        esophagus surgery   Heart disease Father    Hyperlipidemia Father    Hypertension Father    Heart attack Father        MI at age 26   Diabetes Brother        type 2   Heart disease Maternal Grandmother    Heart attack Maternal Grandfather    Hypertension Maternal Grandfather    Stroke Paternal Grandmother    Hypertension Sister    Diabetes Sister    Hypertension Brother    Alcohol abuse Maternal Aunt    Cancer Maternal Aunt    Colon cancer Neg Hx    Stomach cancer Neg Hx    Esophageal cancer Neg Hx     Social History Social History   Tobacco Use   Smoking status: Never   Smokeless tobacco: Never  Substance Use Topics   Alcohol use: No    Alcohol/week: 0.0 standard drinks of alcohol   Drug use: No     Allergies   Pravastatin, Amoxicillin, Atorvastatin, and Codeine   Review of Systems Review of Systems  Cardiovascular:        Left breast pain x 2 weeks     Physical Exam Triage Vital Signs ED Triage Vitals  Enc Vitals Group     BP 06/16/22 1523 125/80     Pulse Rate 06/16/22  1523 68     Resp 06/16/22 1523 18     Temp 06/16/22 1523 98.7 F (37.1 C)     Temp Source 06/16/22 1523 Oral     SpO2 06/16/22 1523 98 %     Weight 06/16/22 1525 183 lb (83 kg)     Height 06/16/22 1525 5' 2.5" (1.588 m)     Head Circumference --      Peak Flow --      Pain Score 06/16/22 1524 7     Pain Loc --      Pain Edu? --      Excl. in [place]? --    No data found.  Updated Vital Signs BP 125/80 (BP Location: Right Arm)   Pulse 68   Temp 98.7 F (37.1 C) (Oral)   Resp 18   Ht 5' 2.5" (1.588 m)   Wt 183 lb (83 kg)   SpO2 98%   BMI 32.94 kg/m   Visual Acuity Right Eye Distance:   Left Eye Distance:   Bilateral Distance:    Right Eye Near:   Left Eye Near:    Bilateral Near:     Physical Exam Vitals and nursing note reviewed.  Constitutional:      Appearance: Normal appearance. She is obese. She is not ill-appearing.  HENT:     Head: Normocephalic and atraumatic.     Mouth/Throat:     Mouth: Mucous membranes are moist.     Pharynx: Oropharynx is clear.  Eyes:     Extraocular Movements: Extraocular movements intact.     Conjunctiva/sclera: Conjunctivae normal.     Pupils: Pupils are equal, round, and reactive to light.  Cardiovascular:     Rate and Rhythm: Normal rate and regular rhythm.     Pulses: Normal pulses.     Heart sounds: Normal heart sounds. No murmur heard. Pulmonary:     Effort: Pulmonary effort is  normal.     Breath sounds: Normal breath sounds. No wheezing, rhonchi or rales.  Musculoskeletal:        General: Normal range of motion.     Cervical back: Normal range of motion and neck supple.  Skin:    General: Skin is warm and dry.  Neurological:     General: No focal deficit present.     Mental Status: She is alert and oriented to person, place, and time. Mental status is at baseline.      UC Treatments / Results  Labs (all labs ordered are listed, but only abnormal results are displayed) Labs Reviewed - No data to  display  EKG   Radiology DG Chest 2 View  Result Date: 06/16/2022 CLINICAL DATA:  Left breast pain EXAM: CHEST - 2 VIEW COMPARISON:  Previous studies including the chest radiographs done on 01/15/2021 FINDINGS: Cardiac size is within normal limits. Lung fields are clear of any infiltrates or pulmonary edema. There is no pleural effusion or pneumothorax. Surgical clips are seen in both breasts. IMPRESSION: No active cardiopulmonary disease. Electronically Signed   By: Elmer Picker M.D.   On: 06/16/2022 15:49    Procedures Procedures (including critical care time)  Medications Ordered in UC Medications - No data to display  Initial Impression / Assessment and Plan / UC Course  I have reviewed the triage vital signs and the nursing notes.  Pertinent labs & imaging results that were available during my care of the patient were reviewed by me and considered in my medical decision making (see chart for details).    MDM: 1.  Breast pain, left-CXR was negative. Advised patient chest x-ray was clear.  Advised/encouraged patient to follow-up with PCP and/or oncology for further evaluation of left breast to include mammogram and or advanced imaging.  Final Clinical Impressions(s) / UC Diagnoses   Final diagnoses:  Breast pain, left     Discharge Instructions      Advised patient chest x-ray was clear.  Advised/encouraged patient to follow-up with PCP and/or oncology for further evaluation of left breast to include mammogram and or advanced imaging.     ED Prescriptions   None    PDMP not reviewed this encounter.   Eliezer Lofts, [place] 06/16/22 [place], [place], [place] 06/16/22 1859

## 2022-06-16 NOTE — Telephone Encounter (Signed)
Called pt back regarding pain left breast side and pt stated its the 2nd time happening Once last week and again this morning. No complains of numbness or pain in arm but intense pain on left breast  Pt made appt for tomorrow with taylor beck, recommended to go UC to be seen pt stated she will and was advised if need [place]  For f/u tomorrow would be ok.

## 2022-06-16 NOTE — ED Triage Notes (Signed)
Patient c/o left breast pain x 2 weeks.  No redness, drainage or swelling.  No injury to the area.  Patient is taken Tylenol for the pain.

## 2022-06-16 NOTE — Telephone Encounter (Signed)
Pt states she is having intense L breast pain that feels like "lighting strikes" she did not complain of numbness or arm pain. Attempted to transfer to triage but she stated she had already talked to a nurse from [facility] and they recommended UC if we cold not see her. Pt stated she wanted to make an appt for tmr but if pain persists she will go to UC today.

## 2022-06-17 ENCOUNTER — Encounter: Payer: Self-pay | Admitting: Family Medicine

## 2022-06-17 ENCOUNTER — Ambulatory Visit (INDEPENDENT_AMBULATORY_CARE_PROVIDER_SITE_OTHER): Payer: HMO | Admitting: Family Medicine

## 2022-06-17 VITALS — BP 128/69 | HR 73 | Temp 98.0°F | Resp 16 | Ht 62.5 in | Wt 183.4 lb

## 2022-06-17 DIAGNOSIS — N644 Mastodynia: Secondary | ICD-10-CM | POA: Diagnosis not present

## 2022-06-17 NOTE — Patient Instructions (Signed)
Given your history, ordering a diagnostic mammogram of both breasts.  Chest xray yesterday looked fine.  No other alarming symptoms at this time, but if you develop any new symptoms, please let us know. Go to the ED for any severe symptoms.

## 2022-06-17 NOTE — Progress Notes (Signed)
Acute Office Visit  Subjective:     Patient ID: [name], female    DOB: May 22, 1951, 72 y.o.   MRN: [id number]  Chief Complaint  Patient presents with   Breast Pain    Left, started 3 weeks ago    HPI Patient is in today for breast pain (left).  She was triaged regarding this yesterday and was told to go to urgent care to rule out cardiac etiology. CXR was normal.   She reports she first noticed the discomfort about 3 weeks ago. She describes it has a shooting, lightening-like pain the goes across her left breast causing 10/10 pain that lasts for a few seconds and then resolved. She confirms that it is more superficial in her breast compared to other chest pains. She denies any dyspnea, diaphoresis, fevers, chills, breast lumps, nipple changes or discharge, rashes, lesions, weight loss. Reports the pain is random and may happen twice a day and the skip a day or two before occurring again. She has not noticed any trends with timing or activity. She reports history of bilateral breast cancer with radiation and lumpectomies 4 years ago. Her mother also had breast cancer. Reports her last screening mammogram was normal 3 months ago. She did have some right breast discomfort a few months ago, but no symptoms recently in the right breast.        ROS All review of systems negative except what is listed in the HPI      Objective:    BP 128/69   Pulse 73   Temp 98 F (36.7 C)   Resp 16   Ht 5' 2.5" (1.588 m)   Wt 183 lb 6.4 oz (83.2 kg)   SpO2 95%   BMI 33.01 kg/m    Physical Exam Vitals reviewed.  Constitutional:      Appearance: Normal appearance.  Chest:     Chest wall: No mass, swelling or tenderness.  Breasts:    Right: Normal. No swelling, bleeding, mass, nipple discharge, skin change or tenderness.     Left: Normal. No swelling, bleeding, mass, nipple discharge, skin change or tenderness.  Musculoskeletal:     Cervical back: Normal range of motion and  neck supple.  Lymphadenopathy:     Upper Body:     Right upper body: No supraclavicular, axillary or pectoral adenopathy.     Left upper body: No supraclavicular, axillary or pectoral adenopathy.  Neurological:     Mental Status: She is alert and oriented to person, place, and time.  Psychiatric:        Mood and Affect: Mood normal.        Behavior: Behavior normal.        Thought Content: Thought content normal.        Judgment: Judgment normal.   (Pt declined chaperone)  No results found for any visits on 06/17/22.      Assessment & Plan:   Problem List Items Addressed This Visit   None Visit Diagnoses     Breast pain, left    -  Primary Given your history, ordering a diagnostic mammogram of both breasts.  Chest xray yesterday looked fine.  No other alarming symptoms at this time, but if you develop any new symptoms, please let us know. Go to the ED for any severe symptoms.     Relevant Orders   MM Digital Diagnostic Bilat       No orders of the defined types were placed in this encounter.  Return if symptoms worsen or fail to improve.  Terrilyn Saver, NP

## 2022-06-22 ENCOUNTER — Other Ambulatory Visit: Payer: Self-pay | Admitting: Family Medicine

## 2022-06-22 DIAGNOSIS — N644 Mastodynia: Secondary | ICD-10-CM

## 2022-06-30 ENCOUNTER — Ambulatory Visit: Payer: HMO

## 2022-06-30 ENCOUNTER — Ambulatory Visit
Admission: RE | Admit: 2022-06-30 | Discharge: 2022-06-30 | Disposition: A | Discharge status: Home / Self Care | Payer: HMO | Source: Ambulatory Visit | Attending: Family Medicine | Admitting: Family Medicine

## 2022-06-30 DIAGNOSIS — N644 Mastodynia: Secondary | ICD-10-CM

## 2022-07-19 ENCOUNTER — Other Ambulatory Visit: Payer: Self-pay | Admitting: Internal Medicine

## 2022-07-19 ENCOUNTER — Other Ambulatory Visit: Payer: Self-pay | Admitting: Family

## 2022-07-19 ENCOUNTER — Other Ambulatory Visit (HOSPITAL_BASED_OUTPATIENT_CLINIC_OR_DEPARTMENT_OTHER): Payer: Self-pay

## 2022-07-19 DIAGNOSIS — J069 Acute upper respiratory infection, unspecified: Secondary | ICD-10-CM

## 2022-07-20 ENCOUNTER — Telehealth: Payer: Self-pay

## 2022-07-20 ENCOUNTER — Other Ambulatory Visit (HOSPITAL_BASED_OUTPATIENT_CLINIC_OR_DEPARTMENT_OTHER): Payer: Self-pay

## 2022-07-20 MED ORDER — GLUCOSE BLOOD VI STRP
ORAL_STRIP | 2 refills | Status: DC
  Filled 2022-07-20: qty 100, 50d supply, fill #0
  Filled 2022-09-20 (×2): qty 100, 50d supply, fill #1
  Filled 2022-11-04: qty 100, 50d supply, fill #2

## 2022-07-20 NOTE — Telephone Encounter (Signed)
Test strips sent per patient request

## 2022-07-21 ENCOUNTER — Other Ambulatory Visit: Payer: Self-pay | Admitting: Family Medicine

## 2022-07-21 ENCOUNTER — Other Ambulatory Visit (HOSPITAL_BASED_OUTPATIENT_CLINIC_OR_DEPARTMENT_OTHER): Payer: Self-pay

## 2022-07-21 DIAGNOSIS — J069 Acute upper respiratory infection, unspecified: Secondary | ICD-10-CM

## 2022-07-21 MED ORDER — ALBUTEROL SULFATE HFA 108 (90 BASE) MCG/ACT IN AERS
2.0000 | INHALATION_SPRAY | Freq: Four times a day (QID) | RESPIRATORY_TRACT | 0 refills | Status: DC | PRN
  Filled 2022-07-21: qty 6.7, 25d supply, fill #0

## 2022-08-03 ENCOUNTER — Encounter: Payer: HMO | Admitting: Family Medicine

## 2022-08-06 ENCOUNTER — Ambulatory Visit (INDEPENDENT_AMBULATORY_CARE_PROVIDER_SITE_OTHER): Payer: PPO | Admitting: Family Medicine

## 2022-08-06 ENCOUNTER — Ambulatory Visit (INDEPENDENT_AMBULATORY_CARE_PROVIDER_SITE_OTHER): Payer: PPO | Admitting: Internal Medicine

## 2022-08-06 ENCOUNTER — Encounter: Payer: Self-pay | Admitting: Family Medicine

## 2022-08-06 ENCOUNTER — Encounter: Payer: Self-pay | Admitting: Internal Medicine

## 2022-08-06 ENCOUNTER — Other Ambulatory Visit (HOSPITAL_BASED_OUTPATIENT_CLINIC_OR_DEPARTMENT_OTHER): Payer: Self-pay

## 2022-08-06 VITALS — BP 134/74 | HR 60 | Temp 98.4°F | Ht 62.0 in | Wt 182.0 lb

## 2022-08-06 VITALS — BP 120/70 | HR 61 | Ht 62.0 in | Wt 183.0 lb

## 2022-08-06 DIAGNOSIS — E1165 Type 2 diabetes mellitus with hyperglycemia: Secondary | ICD-10-CM

## 2022-08-06 DIAGNOSIS — Z0001 Encounter for general adult medical examination with abnormal findings: Secondary | ICD-10-CM

## 2022-08-06 DIAGNOSIS — J0140 Acute pansinusitis, unspecified: Secondary | ICD-10-CM | POA: Diagnosis not present

## 2022-08-06 DIAGNOSIS — I10 Essential (primary) hypertension: Secondary | ICD-10-CM

## 2022-08-06 LAB — POCT GLYCOSYLATED HEMOGLOBIN (HGB A1C): Hemoglobin A1C: 8.7 % — AB (ref 4.0–5.6)

## 2022-08-06 MED ORDER — MONTELUKAST SODIUM 10 MG PO TABS
10.0000 mg | ORAL_TABLET | Freq: Every evening | ORAL | 3 refills | Status: DC | PRN
  Filled 2022-08-06: qty 30, 30d supply, fill #0

## 2022-08-06 MED ORDER — SEMAGLUTIDE(0.25 OR 0.5MG/DOS) 2 MG/3ML ~~LOC~~ SOPN
0.5000 mg | PEN_INJECTOR | SUBCUTANEOUS | 3 refills | Status: DC
  Filled 2022-08-06 – 2022-09-20 (×3): qty 9, 84d supply, fill #0
  Filled 2022-12-06: qty 9, 84d supply, fill #1
  Filled 2022-12-07: qty 3, 28d supply, fill #1
  Filled 2023-01-05: qty 3, 28d supply, fill #2
  Filled 2023-02-07: qty 3, 28d supply, fill #3

## 2022-08-06 MED ORDER — DOXYCYCLINE HYCLATE 100 MG PO TABS
100.0000 mg | ORAL_TABLET | Freq: Two times a day (BID) | ORAL | 0 refills | Status: AC
  Filled 2022-08-06: qty 20, 10d supply, fill #0

## 2022-08-06 MED ORDER — METFORMIN HCL 1000 MG PO TABS
1000.0000 mg | ORAL_TABLET | Freq: Two times a day (BID) | ORAL | 3 refills | Status: DC
  Filled 2022-08-06 – 2022-10-29 (×2): qty 180, 90d supply, fill #0

## 2022-08-06 MED ORDER — REPAGLINIDE 0.5 MG PO TABS
1.0000 mg | ORAL_TABLET | Freq: Two times a day (BID) | ORAL | 2 refills | Status: DC
  Filled 2022-08-06 – 2022-09-20 (×3): qty 360, 90d supply, fill #0
  Filled 2022-09-20: qty 30, 8d supply, fill #0
  Filled 2022-09-20 – 2022-10-08 (×3): qty 360, 90d supply, fill #0

## 2022-08-06 NOTE — Patient Instructions (Signed)
-   Start Ozempic 0.25 mg once a week for 6 weeks, than increase to 0.5 mg once weekly   - Change  Repaglinide 0.5 mg, 2 tablets before Breakfast and Supper  - Continue Metformin 1000 mg , 1 tablet with breakfast and 1 tablet with dinner   HOW TO TREAT LOW BLOOD SUGARS (Blood sugar LESS THAN 70 MG/DL) Please follow the RULE OF 15 for the treatment of hypoglycemia treatment (when your (blood sugars are less than 70 mg/dL)   STEP 1: Take 15 grams of carbohydrates when your blood sugar is low, which includes:  3-4 GLUCOSE TABS  OR 3-4 OZ OF JUICE OR REGULAR SODA OR ONE TUBE OF GLUCOSE GEL    STEP 2: RECHECK blood sugar in 15 MINUTES STEP 3: If your blood sugar is still low at the 15 minute recheck --> then, go back to STEP 1 and treat AGAIN with another 15 grams of carbohydrates.

## 2022-08-06 NOTE — Patient Instructions (Signed)
For your sinus symptoms: Given duration, starting doxycycline antibiotic - take with food and a full glass of water.  Taking your daily antihistamine and Singulair.  Continue supportive measures including rest, hydration, humidifier use, steam showers, warm compresses to sinuses, warm liquids with lemon and honey, and over-the-counter cough, cold, and analgesics as needed.  Continue with ENT follow-up.

## 2022-08-06 NOTE — Progress Notes (Signed)
Complete physical exam  Patient: [name]   DOB: 1951/05/27   72 y.o. Female  MRN: [id number]  Subjective:    Chief Complaint  Patient presents with   Annual Exam    CPE, concerns about continued cold symptoms that will not go completely away. Patient fasting.     CHRISSA Benson is a 72 y.o. female who presents today for a complete physical exam. She reports consuming a general and diabetic  diet. Home exercise routine includes chair exercises. She generally feels well. She reports sleeping well. She does have additional problems to discuss today.   Patient reports she has been struggling with ongoing URI symptoms off and on for the past few months. The past couple weeks seem to have worsened with sinus pressure, nasal congestion, headache, sneezing, coughing. She has not gotten much improvement with Tylenol, Singulair, or Mucinex. She denies any fevers, chills, chest pain, dyspnea, GI/GU symptoms.     Most recent fall risk assessment:    08/06/2022    9:52 AM  Fall Risk   Falls in the past year? 0  Number falls in past yr: 0  Injury with Fall? 0  Risk for fall due to : No Fall Risks  Follow up Falls evaluation completed     Most recent depression screenings:    08/06/2022    9:52 AM 01/28/2022   10:54 AM  PHQ 2/9 Scores  PHQ - 2 Score 0 0  PHQ- 9 Score  0    Vision:Within last year and Dental: No current dental problems and Receives regular dental care  Patient Active Problem List   Diagnosis Date Noted   Constipation 01/29/2022   Chronic sinusitis 01/29/2022   HOCM (hypertrophic obstructive cardiomyopathy) ([place]) 06/26/2021   Heart palpitations 02/01/2021   Elevated lipase 02/01/2021   Need for pneumococcal vaccination 02/01/2021   Diabetes mellitus without complication ([place]) [id number]   Insomnia 07/23/2019   Shingles 02/18/2019   Ductal carcinoma in situ (DCIS) of left breast 07/03/2018   Vertigo 03/13/2018   Ductal carcinoma in situ (DCIS) of  right breast 03/10/2018   Right shoulder pain 10/03/2017   Headache 06/30/2017   Obesity (BMI 30-39.9) 05/02/2017   Seasonal allergies 10/31/2015   Hx of adenomatous colonic polyps 04/30/2014   Cervical cancer screening 01/22/2013   Preventative health care 01/22/2013   Chest pain 01/06/2012   Endometriosis    Chronic cough [id number]   Nonalcoholic fatty liver disease 04/28/2010   OSA (obstructive sleep apnea) 03/26/2009   RT BUNDLE BRANCH BLOCK&LT ANT FASCICULAR BLOCK 12/20/2008   Thoracic back pain 10/10/2008   Osteoarthritis 07/05/2008   Abdominal pain 07/05/2008   Aneurysm, thoracoabdominal ([place]) 03/22/2007   Type 2 diabetes mellitus with hyperglycemia, without long-term current use of insulin ([place]) 10/28/2006   Hyperlipidemia, mixed 10/28/2006   Depression with anxiety 10/28/2006   Essential hypertension 10/28/2006   ESOPHAGEAL STRICTURE 05/04/2004   HIATAL HERNIA 05/04/2004   Family History  Problem Relation Age of Onset   Breast cancer Mother    Hypertension Mother    Diabetes Mother        type 2   Other Mother        esophagus surgery   Heart disease Father    Hyperlipidemia Father    Hypertension Father    Heart attack Father        MI at age 69   Diabetes Brother        type 2   Heart disease Maternal  Grandmother    Heart attack Maternal Grandfather    Hypertension Maternal Grandfather    Stroke Paternal Grandmother    Hypertension Sister    Diabetes Sister    Hypertension Brother    Alcohol abuse Maternal Aunt    Cancer Maternal Aunt    Colon cancer Neg Hx    Stomach cancer Neg Hx    Esophageal cancer Neg Hx    Allergies  Allergen Reactions   Pravastatin Other (See Comments)    Myalgia, fatigue, memory loss   Amoxicillin Itching    REACTION: rash   Atorvastatin Other (See Comments)    Myalgia, fatigue, memory loss   Codeine Nausea And Vomiting      Patient Care Team: Mosie Lukes, MD as PCP - General (Family Medicine) Sueanne Margarita,  MD as PCP - Cardiology (Cardiology) Vickie Epley, MD as PCP - Electrophysiology (Cardiology) Jola Schmidt, MD as Consulting Physician (Ophthalmology) Royston Sinner, Colin Benton, MD as Consulting Physician (Obstetrics and Gynecology) [name], MD as Consulting Physician (General Surgery) Magrinat, Virgie Dad, MD (Inactive) as Consulting Physician (Oncology) Dene Gentry, MD as Consulting Physician (Sports Medicine) [facility], Melanie Crazier, MD as Consulting Physician (Endocrinology)   Outpatient Medications Prior to Visit  Medication Sig   albuterol (VENTOLIN HFA) 108 (90 Base) MCG/ACT inhaler Inhale 2 puffs by mouth into the lungs every 6 (six) hours as needed for wheezing or shortness of breath.   ASPIR-LOW 81 MG EC tablet TAKE 1 TABLET BY MOUTH DAILY   bisoprolol-hydrochlorothiazide (ZIAC) 5-6.25 MG tablet Take 1 tablet by mouth daily.   Continuous Blood Gluc Sensor (FREESTYLE LIBRE 3 SENSOR) MISC Inject 1 sensor into the skin every 14 (fourteen) days. Use to check blood glucose continuously.   fexofenadine (SM FEXOFENADINE HCL) 180 MG tablet Take 1 tablet (180 mg total) by mouth daily.   fluticasone (FLONASE) 50 MCG/ACT nasal spray Place 2 sprays into both nostrils daily.   glucose blood test strip OneTouch Verio test strips   Magnesium Oxide 400 MG CAPS Take 1 capsule (400 mg total) by mouth daily.   MELATONIN PO Take 10 mg by mouth at bedtime.   metFORMIN (GLUCOPHAGE) 1000 MG tablet Take 1 tablet (1,000 mg total) by mouth 2 (two) times daily with a meal.   omeprazole (PRILOSEC) 40 MG capsule Take 1 capsule (40 mg total) by mouth 2 (two) times daily.   repaglinide (PRANDIN) 0.5 MG tablet Take 1 tablet (0.5 mg total) by mouth daily before breakfast AND 2 tablets (1 mg total) daily before supper.   rosuvastatin (CRESTOR) 5 MG tablet Take 1 tablet (5 mg total) by mouth daily.   traZODone (DESYREL) 50 MG tablet Take 1/2 - 1 tablet (25-50 mg total) by mouth at bedtime as needed  for sleep.   [DISCONTINUED] montelukast (SINGULAIR) 10 MG tablet Take 1 tablet (10 mg total) by mouth at bedtime as needed.   Facility-Administered Medications Prior to Visit  Medication Dose Route Frequency Provider   Chlorhexidine Gluconate Cloth 2 % PADS 6 each  6 each Topical Once [name], MD   And   Chlorhexidine Gluconate Cloth 2 % PADS 6 each  6 each Topical Once [name], MD    ROS All review of systems negative except what is listed in the HPI        Objective:     BP 134/74 (BP Location: Right Arm, Patient Position: Sitting, Cuff Size: Normal)   Pulse 60   Temp 98.4 F (36.9 C) (Temporal)  Ht '5\' 2"'$  (1.575 m)   Wt 182 lb (82.6 kg)   SpO2 100%   BMI 33.29 kg/m    Physical Exam Vitals reviewed.  Constitutional:      General: She is not in acute distress.    Appearance: Normal appearance. She is not ill-appearing.  HENT:     Head: Normocephalic and atraumatic.     Comments: Ethmoid sinus tenderness to percussion    Right Ear: Tympanic membrane normal. There is no impacted cerumen.     Left Ear: Tympanic membrane normal. There is no impacted cerumen.     Nose: Congestion present.     Mouth/Throat:     Mouth: Mucous membranes are moist.     Pharynx: Oropharynx is clear.  Eyes:     Extraocular Movements: Extraocular movements intact.     Conjunctiva/sclera: Conjunctivae normal.     Pupils: Pupils are equal, round, and reactive to light.  Neck:     Vascular: No carotid bruit.  Cardiovascular:     Rate and Rhythm: Normal rate and regular rhythm.     Pulses: Normal pulses.     Heart sounds: Normal heart sounds.  Pulmonary:     Effort: Pulmonary effort is normal.     Breath sounds: Normal breath sounds.  Abdominal:     General: Abdomen is flat. Bowel sounds are normal. There is no distension.     Palpations: Abdomen is soft. There is no mass.     Tenderness: There is no abdominal tenderness. There is no right CVA tenderness, left CVA tenderness,  guarding or rebound.  Genitourinary:    Comments: Deferred exam Musculoskeletal:        General: Normal range of motion.     Cervical back: Normal range of motion and neck supple. No tenderness.     Right lower leg: No edema.     Left lower leg: No edema.  Lymphadenopathy:     Cervical: No cervical adenopathy.  Skin:    General: Skin is warm and dry.     Capillary Refill: Capillary refill takes less than 2 seconds.  Neurological:     General: No focal deficit present.     Mental Status: She is alert and oriented to person, place, and time. Mental status is at baseline.  Psychiatric:        Mood and Affect: Mood normal.        Behavior: Behavior normal.        Thought Content: Thought content normal.        Judgment: Judgment normal.      No results found for any visits on 08/06/22.     Assessment & Plan:    Routine Health Maintenance and Physical Exam  Immunization History  Administered Date(s) Administered   Fluad Quad(high Dose 65+) 03/05/2019, 06/02/2022   Influenza Split 03/22/2011, 02/17/2012   Influenza Whole 03/22/2007, 03/19/2008, 04/28/2010   Influenza,inj,Quad PF,6+ Mos 02/26/2013, 03/04/2014, 04/01/2017   Influenza-Unspecified 04/02/2018   PFIZER Comirnaty(Gray Top)Covid-19 Tri-Sucrose Vaccine 11/24/2020   PFIZER(Purple Top)SARS-COV-2 Vaccination 08/13/2019, 09/11/2019, 04/08/2020   PNEUMOCOCCAL CONJUGATE-20 06/02/2022   Pneumococcal Conjugate-13 05/28/2013, 01/29/2021   Pneumococcal Polysaccharide-23 09/04/2015   Td 10/12/2001   Tdap 01/03/2012, 05/09/2018    Health Maintenance  Topic Date Due   Zoster Vaccines- Shingrix (1 of 2) 07/27/2023 (Originally 09/08/1969)   COVID-19 Vaccine (5 - 2023-24 season) 08/22/2023 (Originally 02/12/2022)   Medicare Annual Wellness (AWV)  10/14/2022   HEMOGLOBIN A1C  11/08/2022   OPHTHALMOLOGY EXAM  01/13/2023  FOOT EXAM  02/04/2023   Diabetic kidney evaluation - eGFR measurement  05/11/2023   Diabetic kidney  evaluation - Urine ACR  05/11/2023   COLONOSCOPY (Pts 45-17yr Insurance coverage will need to be confirmed)  01/02/2024   MAMMOGRAM  03/08/2024   DTaP/Tdap/Td (4 - Td or Tdap) 05/09/2028   Pneumonia Vaccine 70 Years old  Completed   INFLUENZA VACCINE  Completed   DEXA SCAN  Completed   Hepatitis C Screening  Completed   HPV VACCINES  Aged Out    Discussed health benefits of physical activity, and encouraged her to engage in regular exercise appropriate for her age and condition.  Problem List Items Addressed This Visit       Cardiovascular and Mediastinum   Essential hypertension - Primary Stable. No new concerns. Continue bisoprolol-hctz and lifestyle measures.      Endocrine   Type 2 diabetes mellitus with hyperglycemia, without long-term current use of insulin (HCC) Last A1c <3 months ago. Following with endocrinology. Continue lifestyles measures.    Other Visit Diagnoses     Encounter for routine adult health examination with abnormal findings     Labs stable within the pat 3 months. Deferred repeat labs today.  Discussed healthy lifestyle choices and safety.     Acute non-recurrent pansinusitis     Given duration, starting doxycycline antibiotic - take with food and a full glass of water.  Taking your daily antihistamine and Singulair.  Continue supportive measures including rest, hydration, humidifier use, steam showers, warm compresses to sinuses, warm liquids with lemon and honey, and over-the-counter cough, cold, and analgesics as needed.  Continue with ENT follow-up.     Relevant Medications   doxycycline (VIBRA-TABS) 100 MG tablet   montelukast (SINGULAIR) 10 MG tablet          Return in about 3 months (around 11/04/2022) for routine follow-up PCP.     TTerrilyn Saver NP

## 2022-08-06 NOTE — Progress Notes (Signed)
Name: [name]  Age/ Sex: 72 y.o., female   MRN/ DOB: [id number], 1951-01-27     PCP: Mosie Lukes, MD   Reason for Endocrinology Evaluation: Type 2 Diabetes Mellitus  Initial Endocrine Consultative Visit: 02/22/2017    PATIENT IDENTIFIER: [name] is a 72 y.o. female with a past medical history of T2DM, HTN , Dyslipidemia, Hx of breast Ca ( S/P right lumpectomy) . The patient has followed with Endocrinology clinic since 02/16/2019 for consultative assistance with management of her diabetes.     DIABETIC HISTORY:  [name] was diagnosed with T2DM in 2001. She has been on metformin, Glimepiride and Januvia. Invokana added in 2018 but was cost prohibitive. Her hemoglobin A1c has ranged from 7.0% in 2016, peaking at 8.8% in  2021.    She used to see Dr.Gherghe but was lost to follow up by 2019 , until her return to our clinic 08/2019.    Started Rybelsus 08/2020 but this had to be stopped by the end of 2022 due to inability to qualify for pt assistance   Switched glipizide to repaglinide 01/2022  SUBJECTIVE:   During the last visit (02/03/2022):  A1c 7.7%     Today (08/06/2022): [name] is here for a follow up on diabetes management.  She checks her blood sugars 2 times daily The patient has had hypoglycemic episodes since the last clinic visit, which typically occur during the day , she has not been symptomatic    She eats twice a day , Breakfast and Supper ,sometimes she snacks for lunch  Denies nausea or diarrhea  Has drowsiness  due to recent infection , on Abx and antihistamine   HOME DIABETES REGIMEN:  Metformin 1000 mg, BID Repaglinide 0.5 mg, 1 tab before breakfast and 2 tabs before Supper     Statin:  Crestor ACE-I/ARB: No   METER DOWNLOAD SUMMARY: unable to download 132-344 mg/dL    DIABETIC COMPLICATIONS: Microvascular complications:   Denies: CKD, retinopathy , neuropathy Last Eye Exam: Completed 2023  Macrovascular  complications:   Denies: CAD, CVA, PVD   HISTORY:  Past Medical History:  Past Medical History:  Diagnosis Date   Acute bronchitis 10/31/2015   Allergy    seasonal   Arthritis    knee-left   Cancer ([place]) 04/2018   right breast ca   Cataract    right eye   Diabetes mellitus 52   type 2   Diaphragmatic hernia without mention of obstruction or gangrene    Endometriosis    Esophageal reflux    Esophageal stricture    GERD (gastroesophageal reflux disease)    HOCM (hypertrophic obstructive cardiomyopathy) (HCC)    Basal septal hypertrophy 16 mm with increased ECV at 30% on cardiac MRI 06/2021 consistent with HOCM   Hyperlipidemia    Hyperplastic colon polyp    Hypertension 52   Nonalcoholic fatty liver disease 04/28/2010   Qualifier: Diagnosis of  By: Arnoldo Morale MD, John E    Nonspecific abnormal results of liver function study    NSVT (nonsustained ventricular tachycardia) ([place])    noted on event monitor 05/2021   Personal history of radiation therapy 08/2018   bilateral breasts   Pharyngitis 02/18/2012   Plantar fascial fibromatosis    Preventative health care 01/22/2013   Seasonal allergies 10/31/2015   Sleep apnea    has oral appliance but does not fit well due to missing teeth   Sore throat 04/03/2017   Tubular  adenoma of colon    Unspecified sleep apnea    no c-pap   Past Surgical History:  Past Surgical History:  Procedure Laterality Date   ABDOMINAL HYSTERECTOMY  1982   partial   APPENDECTOMY     benigh cyst in lung  2006   right   BREAST BIOPSY Bilateral 04/03/2018   BREAST BIOPSY Bilateral 03/23/2018   BREAST CYST INCISION AND DRAINAGE     right breast   BREAST EXCISIONAL BIOPSY Right 2019   BREAST LUMPECTOMY Bilateral 04/2018   BREAST LUMPECTOMY WITH RADIOACTIVE SEED LOCALIZATION Bilateral 05/02/2018   Procedure: RIGHT BREAST SEED LUMPECTOMY X2 AND LEFT BREAST SEED GUIDED LUMPECTOMY;  Surgeon: [name], MD;  Location: [facility];   Service: General;  Laterality: Bilateral;   CATARACT EXTRACTION     right eye   HERNIA REPAIR     per pt, she is not aware of this surgery   RE-EXCISION OF BREAST LUMPECTOMY Right 05/25/2018   Procedure: RE-EXCISION OF RIGHT BREAST LUMPECTOMY;  Surgeon: [name], MD;  Location: [facility];  Service: General;  Laterality: Right;   seba     TUBAL LIGATION     Social History:  reports that she has never smoked. She has never used smokeless tobacco. She reports that she does not drink alcohol and does not use drugs. Family History:  Family History  Problem Relation Age of Onset   Breast cancer Mother    Hypertension Mother    Diabetes Mother        type 2   Other Mother        esophagus surgery   Heart disease Father    Hyperlipidemia Father    Hypertension Father    Heart attack Father        MI at age 12   Diabetes Brother        type 2   Heart disease Maternal Grandmother    Heart attack Maternal Grandfather    Hypertension Maternal Grandfather    Stroke Paternal Grandmother    Hypertension Sister    Diabetes Sister    Hypertension Brother    Alcohol abuse Maternal Aunt    Cancer Maternal Aunt    Colon cancer Neg Hx    Stomach cancer Neg Hx    Esophageal cancer Neg Hx      HOME MEDICATIONS: Allergies as of 08/06/2022       Reactions   Pravastatin Other (See Comments)   Myalgia, fatigue, memory loss   Amoxicillin Itching   REACTION: rash   Atorvastatin Other (See Comments)   Myalgia, fatigue, memory loss   Codeine Nausea And Vomiting        Medication List        Accurate as of August 06, 2022 11:25 AM. If you have any questions, ask your nurse or doctor.          albuterol 108 (90 Base) MCG/ACT inhaler Commonly known as: VENTOLIN HFA Inhale 2 puffs by mouth into the lungs every 6 (six) hours as needed for wheezing or shortness of breath.   Aspir-Low 81 MG tablet Generic drug: aspirin EC TAKE 1 TABLET BY MOUTH DAILY    bisoprolol-hydrochlorothiazide 5-6.25 MG tablet Commonly known as: ZIAC Take 1 tablet by mouth daily.   doxycycline 100 MG tablet Commonly known as: VIBRA-TABS Take 1 tablet (100 mg total) by mouth 2 (two) times daily for 10 days. Started by: Terrilyn Saver, NP   fluticasone 50 MCG/ACT nasal  spray Commonly known as: FLONASE Place 2 sprays into both nostrils daily.   FreeStyle Libre 3 Sensor Misc Inject 1 sensor into the skin every 14 (fourteen) days. Use to check blood glucose continuously.   Magnesium Oxide 400 MG Caps Take 1 capsule (400 mg total) by mouth daily.   MELATONIN PO Take 10 mg by mouth at bedtime.   metFORMIN 1000 MG tablet Commonly known as: GLUCOPHAGE Take 1 tablet (1,000 mg total) by mouth 2 (two) times daily with a meal.   montelukast 10 MG tablet Commonly known as: SINGULAIR Take 1 tablet (10 mg total) by mouth at bedtime as needed.   omeprazole 40 MG capsule Commonly known as: PRILOSEC Take 1 capsule (40 mg total) by mouth 2 (two) times daily.   OneTouch Verio test strip Generic drug: glucose blood OneTouch Verio test strips   repaglinide 0.5 MG tablet Commonly known as: PRANDIN Take 1 tablet (0.5 mg total) by mouth daily before breakfast AND 2 tablets (1 mg total) daily before supper.   rosuvastatin 5 MG tablet Commonly known as: CRESTOR Take 1 tablet (5 mg total) by mouth daily.   Semaglutide(0.25 or 0.'5MG'$ /DOS) 2 MG/3ML Sopn Inject 0.5 mg into the skin once a week. Started by: Dorita Sciara, MD   SM Fexofenadine HCl 180 MG tablet Generic drug: fexofenadine Take 1 tablet (180 mg total) by mouth daily.   traZODone 50 MG tablet Commonly known as: DESYREL Take 1/2 - 1 tablet (25-50 mg total) by mouth at bedtime as needed for sleep.         OBJECTIVE:   Vital Signs: BP 120/70 (BP Location: Left Arm, Patient Position: Sitting, Cuff Size: Large)   Pulse 61   Ht '5\' 2"'$  (1.575 m)   Wt 183 lb (83 kg)   SpO2 99%   BMI 33.47 kg/m       Exam: General: Pt appears well and is in NAD  Lungs: Clear with good BS bilat  Heart: RRR   Abdomen: soft, nontender  Extremities: No pretibial edema.   Neuro: MS is good with appropriate affect, pt is alert and Ox3       DM foot exam 08/06/2022  The skin of the feet is intact without sores or ulcerations. The pedal pulses are 2+ on right and 2+ on left. The sensation is intact to a screening 5.07, 10 gram monofilament bilaterally  DATA REVIEWED:  Lab Results  Component Value Date   HGBA1C 8.7 (A) 08/06/2022   HGBA1C 8.3 (H) 05/10/2022   HGBA1C 7.7 (H) 01/28/2022    Latest Reference Range & Units 05/10/22 10:57  Sodium 135 - 145 mEq/L 139  Potassium 3.5 - 5.1 mEq/L 3.8  Chloride 96 - 112 mEq/L 102  CO2 19 - 32 mEq/L 26  Glucose 70 - 99 mg/dL 164 (H)  BUN 6 - 23 mg/dL 19  Creatinine 0.40 - 1.20 mg/dL 1.02  Calcium 8.4 - 10.5 mg/dL 9.4  Alkaline Phosphatase 39 - 117 U/L 77  Albumin 3.5 - 5.2 g/dL 4.0  AST 0 - 37 U/L 18  ALT 0 - 35 U/L 24  Total Protein 6.0 - 8.3 g/dL 6.7  Total Bilirubin 0.2 - 1.2 mg/dL 0.3  GFR >60.00 mL/min 56.07 (L)    Latest Reference Range & Units 05/10/22 10:57  Total CHOL/HDL Ratio  4  Cholesterol 0 - 200 mg/dL 154  HDL Cholesterol >39.00 mg/dL 43.80  LDL (calc) 0 - 99 mg/dL 83  MICROALB/CREAT RATIO 0.0 - 30.0 mg/g 0.5  NonHDL  110.65  Triglycerides 0.0 - 149.0 mg/dL 136.0  VLDL 0.0 - 40.0 mg/dL 27.2    Latest Reference Range & Units 05/10/22 10:57  Creatinine,U mg/dL 127.8  Microalb, Ur 0.0 - 1.9 mg/dL <0.7  MICROALB/CREAT RATIO 0.0 - 30.0 mg/g 0.5     ASSESSMENT / PLAN / RECOMMENDATIONS:   1) Type 2 Diabetes Mellitus,poorly  Controlled, Without complications - Most recent A1c of 8.7  %. Goal A1c < 7.0 %.    -Hypoglycemia have resolved with discontinuation of Glipizide but now has hyperglycemia  - We discussed importance of low carb diet  - She was on Rybelsus and developed headaches with it, she is willing to try  Ozempic, she was educated by CMA on injection technique     MEDICATIONS: -Start Ozempic 0.25 mg weekly, after 6 weeks increase to 0.5 mg weekly -Change repaglinide 0.5 mg, 2 tabs before breakfast and 2 tabs before supper -Continue metformin 1000 mg twice daily   EDUCATION / INSTRUCTIONS: BG monitoring instructions: Patient is instructed to check her blood sugars 2 times a day, fasting and supper time. Call [facility] if: BG persistently < 70  I reviewed the Rule of 15 for the treatment of hypoglycemia in detail with the patient. Literature supplied.    2) Diabetic complications:  Eye: Does not have known diabetic retinopathy.  Neuro/ Feet: Does not have known diabetic peripheral neuropathy .  Renal: Patient does not have known baseline CKD. She   is not on an ACEI/ARB at present.  MA/CR.normal   3) Dyslipidemia:   -LDL at goal -She is on rosuvastatin 5 mg, half a tablet daily, she is intolerant to higher doses     F/U in 4 months   Signed electronically by: Mack Guise, MD  [facility]  [facility] [address] [place], [place] [address] Phone: [phone] FAX: [phone]   CC: Mosie Lukes, [address] [place] [address] Phone: [phone]  Fax: [phone]  Return to Endocrinology clinic as below: Future Appointments  Date Time Provider [place]  10/15/2022  9:40 AM [facility] HEALTH COACH [facility]  11/15/2022 10:20 AM Mosie Lukes, MD [facility]

## 2022-08-09 ENCOUNTER — Other Ambulatory Visit (HOSPITAL_BASED_OUTPATIENT_CLINIC_OR_DEPARTMENT_OTHER): Payer: Self-pay

## 2022-08-11 ENCOUNTER — Other Ambulatory Visit (HOSPITAL_BASED_OUTPATIENT_CLINIC_OR_DEPARTMENT_OTHER): Payer: Self-pay

## 2022-08-12 ENCOUNTER — Other Ambulatory Visit (HOSPITAL_BASED_OUTPATIENT_CLINIC_OR_DEPARTMENT_OTHER): Payer: Self-pay

## 2022-08-17 ENCOUNTER — Other Ambulatory Visit (HOSPITAL_BASED_OUTPATIENT_CLINIC_OR_DEPARTMENT_OTHER): Payer: Self-pay

## 2022-08-23 ENCOUNTER — Other Ambulatory Visit (HOSPITAL_BASED_OUTPATIENT_CLINIC_OR_DEPARTMENT_OTHER): Payer: Self-pay

## 2022-08-24 ENCOUNTER — Other Ambulatory Visit (HOSPITAL_BASED_OUTPATIENT_CLINIC_OR_DEPARTMENT_OTHER): Payer: Self-pay

## 2022-08-26 ENCOUNTER — Other Ambulatory Visit (HOSPITAL_BASED_OUTPATIENT_CLINIC_OR_DEPARTMENT_OTHER): Payer: Self-pay

## 2022-08-27 ENCOUNTER — Other Ambulatory Visit (HOSPITAL_COMMUNITY): Payer: Self-pay

## 2022-08-27 ENCOUNTER — Other Ambulatory Visit (HOSPITAL_BASED_OUTPATIENT_CLINIC_OR_DEPARTMENT_OTHER): Payer: Self-pay

## 2022-08-30 ENCOUNTER — Other Ambulatory Visit (HOSPITAL_BASED_OUTPATIENT_CLINIC_OR_DEPARTMENT_OTHER): Payer: Self-pay

## 2022-08-30 ENCOUNTER — Other Ambulatory Visit (HOSPITAL_COMMUNITY): Payer: Self-pay

## 2022-08-30 ENCOUNTER — Encounter: Payer: Self-pay | Admitting: Internal Medicine

## 2022-08-31 ENCOUNTER — Other Ambulatory Visit (HOSPITAL_BASED_OUTPATIENT_CLINIC_OR_DEPARTMENT_OTHER): Payer: Self-pay

## 2022-09-01 ENCOUNTER — Other Ambulatory Visit (HOSPITAL_BASED_OUTPATIENT_CLINIC_OR_DEPARTMENT_OTHER): Payer: Self-pay

## 2022-09-02 ENCOUNTER — Other Ambulatory Visit (HOSPITAL_COMMUNITY): Payer: Self-pay

## 2022-09-02 ENCOUNTER — Other Ambulatory Visit (HOSPITAL_BASED_OUTPATIENT_CLINIC_OR_DEPARTMENT_OTHER): Payer: Self-pay

## 2022-09-06 ENCOUNTER — Other Ambulatory Visit (HOSPITAL_BASED_OUTPATIENT_CLINIC_OR_DEPARTMENT_OTHER): Payer: Self-pay

## 2022-09-06 ENCOUNTER — Other Ambulatory Visit: Payer: Self-pay

## 2022-09-06 ENCOUNTER — Other Ambulatory Visit: Payer: Self-pay | Admitting: Family Medicine

## 2022-09-06 MED ORDER — ROSUVASTATIN CALCIUM 5 MG PO TABS
5.0000 mg | ORAL_TABLET | Freq: Every day | ORAL | 1 refills | Status: DC
  Filled 2022-09-06: qty 90, 90d supply, fill #0
  Filled 2022-12-06: qty 90, 90d supply, fill #1

## 2022-09-07 ENCOUNTER — Other Ambulatory Visit (HOSPITAL_BASED_OUTPATIENT_CLINIC_OR_DEPARTMENT_OTHER): Payer: Self-pay

## 2022-09-07 ENCOUNTER — Other Ambulatory Visit: Payer: Self-pay

## 2022-09-13 ENCOUNTER — Other Ambulatory Visit (HOSPITAL_COMMUNITY): Payer: Self-pay

## 2022-09-14 DIAGNOSIS — H9202 Otalgia, left ear: Secondary | ICD-10-CM | POA: Diagnosis not present

## 2022-09-14 DIAGNOSIS — R42 Dizziness and giddiness: Secondary | ICD-10-CM | POA: Diagnosis not present

## 2022-09-20 ENCOUNTER — Other Ambulatory Visit (HOSPITAL_BASED_OUTPATIENT_CLINIC_OR_DEPARTMENT_OTHER): Payer: Self-pay

## 2022-09-20 ENCOUNTER — Other Ambulatory Visit: Payer: Self-pay

## 2022-09-20 ENCOUNTER — Other Ambulatory Visit: Payer: Self-pay | Admitting: Family Medicine

## 2022-09-20 ENCOUNTER — Other Ambulatory Visit (HOSPITAL_COMMUNITY): Payer: Self-pay

## 2022-09-20 MED ORDER — BISOPROLOL-HYDROCHLOROTHIAZIDE 5-6.25 MG PO TABS
1.0000 | ORAL_TABLET | Freq: Every day | ORAL | 1 refills | Status: DC
  Filled 2022-09-20: qty 90, 90d supply, fill #0
  Filled 2022-12-21: qty 90, 90d supply, fill #1

## 2022-09-21 ENCOUNTER — Other Ambulatory Visit (HOSPITAL_BASED_OUTPATIENT_CLINIC_OR_DEPARTMENT_OTHER): Payer: Self-pay

## 2022-09-24 ENCOUNTER — Other Ambulatory Visit (HOSPITAL_BASED_OUTPATIENT_CLINIC_OR_DEPARTMENT_OTHER): Payer: Self-pay

## 2022-09-27 ENCOUNTER — Encounter: Payer: Self-pay | Admitting: *Deleted

## 2022-10-08 ENCOUNTER — Other Ambulatory Visit (HOSPITAL_BASED_OUTPATIENT_CLINIC_OR_DEPARTMENT_OTHER): Payer: Self-pay

## 2022-10-08 ENCOUNTER — Other Ambulatory Visit (HOSPITAL_COMMUNITY): Payer: Self-pay

## 2022-10-08 ENCOUNTER — Other Ambulatory Visit: Payer: Self-pay | Admitting: Family Medicine

## 2022-10-08 DIAGNOSIS — J069 Acute upper respiratory infection, unspecified: Secondary | ICD-10-CM

## 2022-10-08 MED ORDER — FLUTICASONE PROPIONATE 50 MCG/ACT NA SUSP
2.0000 | Freq: Every day | NASAL | 5 refills | Status: DC
Start: 2022-10-08 — End: 2023-08-29
  Filled 2022-10-08: qty 16, 30d supply, fill #0
  Filled 2022-11-04: qty 16, 30d supply, fill #1
  Filled 2022-12-21: qty 16, 30d supply, fill #2
  Filled 2023-03-10: qty 16, 30d supply, fill #3
  Filled 2023-04-12 – 2023-04-13 (×2): qty 16, 30d supply, fill #4
  Filled 2023-07-04: qty 16, 30d supply, fill #5

## 2022-10-09 ENCOUNTER — Other Ambulatory Visit (HOSPITAL_BASED_OUTPATIENT_CLINIC_OR_DEPARTMENT_OTHER): Payer: Self-pay

## 2022-10-11 ENCOUNTER — Other Ambulatory Visit (HOSPITAL_BASED_OUTPATIENT_CLINIC_OR_DEPARTMENT_OTHER): Payer: Self-pay

## 2022-10-15 ENCOUNTER — Ambulatory Visit (INDEPENDENT_AMBULATORY_CARE_PROVIDER_SITE_OTHER): Payer: PPO | Admitting: *Deleted

## 2022-10-15 VITALS — BP 134/67 | HR 82 | Temp 97.2°F | Ht 62.0 in | Wt 183.0 lb

## 2022-10-15 DIAGNOSIS — Z0000 Encounter for general adult medical examination without abnormal findings: Secondary | ICD-10-CM | POA: Diagnosis not present

## 2022-10-15 NOTE — Progress Notes (Signed)
Subjective:   [name] is a 72 y.o. female who presents for Medicare Annual (Subsequent) preventive examination.  I connected with  [name] on 10/15/22 by a audio enabled telemedicine application and verified that I am speaking with the correct person using two identifiers.  Patient Location: Home  Provider Location: Office/Clinic  I discussed the limitations of evaluation and management by telemedicine. The patient expressed understanding and agreed to proceed.   Review of Systems     Cardiac Risk Factors include: advanced age (>107men, >51 women);diabetes mellitus;dyslipidemia;hypertension;obesity (BMI >30kg/m2)     Objective:    Today's Vitals   10/15/22 0950  BP: 134/67  Pulse: 82  Temp: (!) 97.2 F (36.2 C)  SpO2: 97%  Weight: 183 lb (83 kg)  Height: 5\' 2"  (1.575 m)   Body mass index is 33.47 kg/m.     10/15/2022    9:42 AM 10/13/2021    9:42 AM 08/27/2021    4:43 PM 01/15/2021    4:20 PM 01/09/2020    1:51 PM 01/02/2020    2:35 PM 06/22/2018    1:00 PM  Advanced Directives  Does Patient Have a Medical Advance Directive? Yes Yes Yes No Yes No Yes  Type of Estate agent of [place];Living will Healthcare Power of [place];Out of facility DNR (pink MOST or yellow form);Living will Healthcare Power of [place];Living will  Healthcare Power of [place];Living will  Healthcare Power of [place];Living will  Does patient want to make changes to medical advance directive?   No - Patient declined  No - Patient declined    Copy of Healthcare Power of Attorney in Chart? No - copy requested No - copy requested No - copy requested      Would patient like information on creating a medical advance directive?    No - Patient declined       Current Medications (verified) Outpatient Encounter Medications as of 10/15/2022  Medication Sig   albuterol (VENTOLIN HFA) 108 (90 Base) MCG/ACT inhaler Inhale 2 puffs by mouth into the lungs every 6 (six)  hours as needed for wheezing or shortness of breath.   ASPIR-LOW 81 MG EC tablet TAKE 1 TABLET BY MOUTH DAILY   bisoprolol-hydrochlorothiazide (ZIAC) 5-6.25 MG tablet Take 1 tablet by mouth daily.   Continuous Glucose Sensor (FREESTYLE LIBRE 3 SENSOR) MISC Inject 1 sensor into the skin every 14 (fourteen) days. Use to check blood glucose continuously.   fexofenadine (SM FEXOFENADINE HCL) 180 MG tablet Take 1 tablet (180 mg total) by mouth daily.   fluticasone (FLONASE) 50 MCG/ACT nasal spray Place 2 sprays into both nostrils daily.   glucose blood test strip OneTouch Verio test strips   Magnesium Oxide 400 MG CAPS Take 1 capsule (400 mg total) by mouth daily.   MELATONIN PO Take 10 mg by mouth at bedtime.   metFORMIN (GLUCOPHAGE) 1000 MG tablet Take 1 tablet (1,000 mg total) by mouth 2 (two) times daily with a meal.   montelukast (SINGULAIR) 10 MG tablet Take 1 tablet (10 mg total) by mouth at bedtime as needed.   omeprazole (PRILOSEC) 40 MG capsule Take 1 capsule (40 mg total) by mouth 2 (two) times daily.   repaglinide (PRANDIN) 0.5 MG tablet Take 2 tablets (1 mg total) by mouth 2 (two) times daily before a meal.   rosuvastatin (CRESTOR) 5 MG tablet Take 1 tablet (5 mg total) by mouth daily.   Semaglutide,0.25 or 0.5MG /DOS, 2 MG/3ML SOPN Inject 0.5 mg into the  skin once a week.   traZODone (DESYREL) 50 MG tablet Take 1/2 - 1 tablet (25-50 mg total) by mouth at bedtime as needed for sleep.   Facility-Administered Encounter Medications as of 10/15/2022  Medication   Chlorhexidine Gluconate Cloth 2 % PADS 6 each   And   Chlorhexidine Gluconate Cloth 2 % PADS 6 each    Allergies (verified) Pravastatin, Amoxicillin, Atorvastatin, and Codeine   History: Past Medical History:  Diagnosis Date   Acute bronchitis 10/31/2015   Allergy    seasonal   Arthritis    knee-left   Cancer (HCC) 04/2018   right breast ca   Cataract    right eye   Diabetes mellitus 52   type 2   Diaphragmatic  hernia without mention of obstruction or gangrene    Endometriosis    Esophageal reflux    Esophageal stricture    GERD (gastroesophageal reflux disease)    HOCM (hypertrophic obstructive cardiomyopathy) (HCC)    Basal septal hypertrophy 16 mm with increased ECV at 30% on cardiac MRI 06/2021 consistent with HOCM   Hyperlipidemia    Hyperplastic colon polyp    Hypertension 52   Nonalcoholic fatty liver disease 04/28/2010   Qualifier: Diagnosis of  By: Lovell Sheehan MD, John E    Nonspecific abnormal results of liver function study    NSVT (nonsustained ventricular tachycardia) (HCC)    noted on event monitor 05/2021   Personal history of radiation therapy 08/2018   bilateral breasts   Pharyngitis 02/18/2012   Plantar fascial fibromatosis    Preventative health care 01/22/2013   Seasonal allergies 10/31/2015   Sleep apnea    has oral appliance but does not fit well due to missing teeth   Sore throat 04/03/2017   Tubular adenoma of colon    Unspecified sleep apnea    no c-pap   Past Surgical History:  Procedure Laterality Date   ABDOMINAL HYSTERECTOMY  1982   partial   APPENDECTOMY     benigh cyst in lung  2006   right   BREAST BIOPSY Bilateral 04/03/2018   BREAST BIOPSY Bilateral 03/23/2018   BREAST CYST INCISION AND DRAINAGE     right breast   BREAST EXCISIONAL BIOPSY Right 2019   BREAST LUMPECTOMY Bilateral 04/2018   BREAST LUMPECTOMY WITH RADIOACTIVE SEED LOCALIZATION Bilateral 05/02/2018   Procedure: RIGHT BREAST SEED LUMPECTOMY X2 AND LEFT BREAST SEED GUIDED LUMPECTOMY;  Surgeon: Almond Lint, MD;  Location: [facility];  Service: General;  Laterality: Bilateral;   CATARACT EXTRACTION     right eye   HERNIA REPAIR     per pt, she is not aware of this surgery   RE-EXCISION OF BREAST LUMPECTOMY Right 05/25/2018   Procedure: RE-EXCISION OF RIGHT BREAST LUMPECTOMY;  Surgeon: Almond Lint, MD;  Location:  SURGERY CENTER;  Service: General;  Laterality:  Right;   seba     TUBAL LIGATION     Family History  Problem Relation Age of Onset   Breast cancer Mother    Hypertension Mother    Diabetes Mother        type 2   Other Mother        esophagus surgery   Heart disease Father    Hyperlipidemia Father    Hypertension Father    Heart attack Father        MI at age 37   Diabetes Brother        type 2   Heart disease Maternal Grandmother  Heart attack Maternal Grandfather    Hypertension Maternal Grandfather    Stroke Paternal Grandmother    Hypertension Sister    Diabetes Sister    Hypertension Brother    Alcohol abuse Maternal Aunt    Cancer Maternal Aunt    Colon cancer Neg Hx    Stomach cancer Neg Hx    Esophageal cancer Neg Hx    Social History   Socioeconomic History   Marital status: Married    Spouse name: Timothy   Number of children: 2   Years of education: Not on file   Highest education level: Not on file  Occupational History   Occupation: Insurance risk surveyor: [employer]  Tobacco Use   Smoking status: Never   Smokeless tobacco: Never  Substance and Sexual Activity   Alcohol use: No    Alcohol/week: 0.0 standard drinks of alcohol   Drug use: No   Sexual activity: Yes    Birth control/protection: Post-menopausal    Comment: lives with husband, no dietary restrictions just watching carbs, wears seat belt  Other Topics Concern   Not on file  Social History Narrative   Not on file   Social Determinants of Health   Financial Resource Strain: Low Risk  (10/13/2021)   Overall Financial Resource Strain (CARDIA)    Difficulty of Paying Living Expenses: Not hard at all  Food Insecurity: No Food Insecurity (10/15/2022)   Hunger Vital Sign    Worried About Running Out of Food in the Last Year: Never true    Ran Out of Food in the Last Year: Never true  Transportation Needs: No Transportation Needs (10/15/2022)   PRAPARE - Administrator, Civil Service (Medical): No    Lack of  Transportation (Non-Medical): No  Physical Activity: Inactive (10/15/2022)   Exercise Vital Sign    Days of Exercise per Week: 0 days    Minutes of Exercise per Session: 0 min  Stress: No Stress Concern Present (10/13/2021)   [employer] of Occupational Health - Occupational Stress Questionnaire    Feeling of Stress : Not at all  Social Connections: Socially Integrated (10/13/2021)   Social Connection and Isolation Panel [NHANES]    Frequency of Communication with Friends and Family: More than three times a week    Frequency of Social Gatherings with Friends and Family: More than three times a week    Attends Religious Services: More than 4 times per year    Active Member of [employer] or Organizations: Yes    Attends Engineer, structural: More than 4 times per year    Marital Status: Married    Tobacco Counseling Counseling given: Not Answered   Clinical Intake:  Pre-visit preparation completed: Yes  Pain : No/denies pain  BMI - recorded: 33.47 Nutritional Status: BMI > 30  Obese Nutritional Risks: None Diabetes: Yes CBG done?: No Did pt. bring in CBG monitor from home?: No  How often do you need to have someone help you when you read instructions, pamphlets, or other written materials from your doctor or pharmacy?: 1 - Never   Activities of Daily Living    10/15/2022    9:49 AM  In your present state of health, do you have any difficulty performing the following activities:  Hearing? 0  Vision? 0  Difficulty concentrating or making decisions? 1  Comment "remembering some things"  Walking or climbing stairs? 0  Dressing or bathing? 0  Doing errands, shopping? 0  Preparing  Food and eating ? N  Using the Toilet? N  In the past six months, have you accidently leaked urine? N  Do you have problems with loss of bowel control? N  Managing your Medications? N  Managing your Finances? N  Housekeeping or managing your Housekeeping? N    Patient Care Team: Bradd Canary, MD as PCP - General (Family Medicine) Quintella Reichert, MD as PCP - Cardiology (Cardiology) Lanier Prude, MD as PCP - Electrophysiology (Cardiology) Sinda Du, MD as Consulting Physician (Ophthalmology) Elon Spanner Madelaine Etienne, MD as Consulting Physician (Obstetrics and Gynecology) Almond Lint, MD as Consulting Physician (General Surgery) Magrinat, Valentino Hue, MD (Inactive) as Consulting Physician (Oncology) Lenda Kelp, MD as Consulting Physician (Sports Medicine) [facility], Konrad Dolores, MD as Consulting Physician (Endocrinology)  Indicate any recent Medical Services you may have received from other than [facility] providers in the past year (date may be approximate).     Assessment:   This is a routine wellness examination for [facility].  Hearing/Vision screen No results found.  Dietary issues and exercise activities discussed: Current Exercise Habits: The patient does not participate in regular exercise at present, Exercise limited by: None identified   Goals Addressed   None    Depression Screen    10/15/2022    9:48 AM 08/06/2022    9:52 AM 01/28/2022   10:54 AM 10/13/2021    9:43 AM 08/13/2021    3:38 PM 04/06/2021   11:06 AM 01/29/2021    9:46 AM  PHQ 2/9 Scores  PHQ - 2 Score 1 0 0 1 1 0 2  PHQ- 9 Score   0    9    Fall Risk    10/15/2022    9:44 AM 08/06/2022    9:52 AM 01/28/2022   10:54 AM 10/13/2021    9:42 AM 08/13/2021    3:38 PM  Fall Risk   Falls in the past year? 0 0 0 0 0  Number falls in past yr: 0 0 0 0 0  Injury with Fall? 0 0 0 0 0  Risk for fall due to : No Fall Risks No Fall Risks  No Fall Risks No Fall Risks  Follow up Falls evaluation completed Falls evaluation completed  Falls evaluation completed Falls evaluation completed    FALL RISK PREVENTION PERTAINING TO THE HOME:  Any stairs in or around the home? Yes  If so, are there any without handrails? No  Home free of loose throw rugs in walkways, pet beds, electrical cords,  etc? Yes  Adequate lighting in your home to reduce risk of falls? Yes   ASSISTIVE DEVICES UTILIZED TO PREVENT FALLS:  Life alert? No  Use of a cane, walker or w/c? No  Grab bars in the bathroom? No  Shower chair or bench in shower? No  Elevated toilet seat or a handicapped toilet? No   TIMED UP AND GO:  Was the test performed?  No, audio visit .    Cognitive Function:        10/15/2022    9:51 AM 10/13/2021    9:47 AM  6CIT Screen  What Year? 0 points 0 points  What month? 0 points 0 points  What time? 0 points 0 points  Count back from 20 0 points 0 points  Months in reverse 0 points 0 points  Repeat phrase 0 points 0 points  Total Score 0 points 0 points    Immunizations Immunization History  Administered Date(s)  Administered   Fluad Quad(high Dose 65+) 03/05/2019, 06/02/2022   Influenza Split 03/22/2011, 02/17/2012   Influenza Whole 03/22/2007, 03/19/2008, 04/28/2010   Influenza,inj,Quad PF,6+ Mos 02/26/2013, 03/04/2014, 04/01/2017   Influenza-Unspecified 04/02/2018   PFIZER Comirnaty(Gray Top)Covid-19 Tri-Sucrose Vaccine 11/24/2020   PFIZER(Purple Top)SARS-COV-2 Vaccination 08/13/2019, 09/11/2019, 04/08/2020   PNEUMOCOCCAL CONJUGATE-20 06/02/2022   Pneumococcal Conjugate-13 05/28/2013, 01/29/2021   Pneumococcal Polysaccharide-23 09/04/2015   Td 10/12/2001   Tdap 01/03/2012, 05/09/2018    TDAP status: Up to date  Flu Vaccine status: Up to date  Pneumococcal vaccine status: Up to date  Covid-19 vaccine status: Information provided on how to obtain vaccines.   Qualifies for Shingles Vaccine? Yes   Zostavax completed No   Shingrix Completed?: No.    Education has been provided regarding the importance of this vaccine. Patient has been advised to call insurance company to determine out of pocket expense if they have not yet received this vaccine. Advised may also receive vaccine at local pharmacy or Health Dept. Verbalized acceptance and  understanding.  Screening Tests Health Maintenance  Topic Date Due   Medicare Annual Wellness (AWV)  10/14/2022   Zoster Vaccines- Shingrix (1 of 2) 07/27/2023 (Originally 09/08/1969)   COVID-19 Vaccine (5 - 2023-24 season) 08/22/2023 (Originally 02/12/2022)   INFLUENZA VACCINE  01/13/2023   OPHTHALMOLOGY EXAM  01/13/2023   HEMOGLOBIN A1C  02/04/2023   Diabetic kidney evaluation - eGFR measurement  05/11/2023   Diabetic kidney evaluation - Urine ACR  05/11/2023   FOOT EXAM  08/07/2023   COLONOSCOPY (Pts 45-60yrs Insurance coverage will need to be confirmed)  01/02/2024   MAMMOGRAM  03/08/2024   DTaP/Tdap/Td (4 - Td or Tdap) 05/09/2028   Pneumonia Vaccine 76+ Years old  Completed   DEXA SCAN  Completed   Hepatitis C Screening  Completed   HPV VACCINES  Aged Out    Health Maintenance  Health Maintenance Due  Topic Date Due   Medicare Annual Wellness (AWV)  10/14/2022    Colorectal cancer screening: Type of screening: Colonoscopy. Completed 01/01/21. Repeat every 3 years  Mammogram status: Completed 03/08/22. Repeat every year  Bone Density status: Completed 02/23/22. Results reflect: Bone density results: NORMAL. Repeat every 2 years.  Lung Cancer Screening: (Low Dose CT Chest recommended if Age 86-80 years, 30 pack-year currently smoking OR have quit w/in 15years.) does not qualify.   Additional Screening:  Hepatitis C Screening: does qualify; Completed 08/24/12  Vision Screening: Recommended annual ophthalmology exams for early detection of glaucoma and other disorders of the eye. Is the patient up to date with their annual eye exam?  Yes  Who is the provider or what is the name of the office in which the patient attends annual eye exams? [facility] [address] [facility] If pt is not established with a provider, would they like to be referred to a provider to establish care? No .   Dental Screening: Recommended annual dental exams for proper oral hygiene  Community Resource Referral  / Chronic Care Management: CRR required this visit?  No   CCM required this visit?  No      Plan:     I have personally reviewed and noted the following in the patient's chart:   Medical and social history Use of alcohol, tobacco or illicit drugs  Current medications and supplements including opioid prescriptions. Patient is not currently taking opioid prescriptions. Functional ability and status Nutritional status Physical activity Advanced directives List of other physicians Hospitalizations, surgeries, and ER visits in previous 12 months Vitals Screenings  to include cognitive, depression, and falls Referrals and appointments  In addition, I have reviewed and discussed with patient certain preventive protocols, quality metrics, and best practice recommendations. A written personalized care plan for preventive services as well as general preventive health recommendations were provided to patient.   Due to this being a telephonic visit, the after visit summary with patients personalized plan was offered to patient via mail or my-chart. Patient would like to access on my-chart.  Donne Anon, [place]   10/15/2022   Nurse Notes: None

## 2022-10-15 NOTE — Patient Instructions (Signed)
[name] , Thank you for taking time to come for your Medicare Wellness Visit. I appreciate your ongoing commitment to your health goals. Please review the following plan we discussed and let me know if I can assist you in the future.     This is a list of the screening recommended for you and due dates:  Health Maintenance  Topic Date Due   Zoster (Shingles) Vaccine (1 of 2) 07/27/2023*   COVID-19 Vaccine (5 - 2023-24 season) 08/22/2023*   Flu Shot  01/13/2023   Eye exam for diabetics  01/13/2023   Hemoglobin A1C  02/04/2023   Yearly kidney function blood test for diabetes  05/11/2023   Yearly kidney health urinalysis for diabetes  05/11/2023   Complete foot exam   08/07/2023   Medicare Annual Wellness Visit  10/15/2023   Colon Cancer Screening  01/02/2024   Mammogram  03/08/2024   DTaP/Tdap/Td vaccine (4 - Td or Tdap) 05/09/2028   Pneumonia Vaccine  Completed   DEXA scan (bone density measurement)  Completed   Hepatitis C Screening: USPSTF Recommendation to screen - Ages 68-79 yo.  Completed   HPV Vaccine  Aged Out  *Topic was postponed. The date shown is not the original due date.    Next appointment: Follow up in one year for your annual wellness visit.   Preventive Care 36 Years and Older, Female Preventive care refers to lifestyle choices and visits with your health care provider that can promote health and wellness. What does preventive care include? A yearly physical exam. This is also called an annual well check. Dental exams once or twice a year. Routine eye exams. Ask your health care provider how often you should have your eyes checked. Personal lifestyle choices, including: Daily care of your teeth and gums. Regular physical activity. Eating a healthy diet. Avoiding tobacco and drug use. Limiting alcohol use. Practicing safe sex. Taking low-dose aspirin every day. Taking vitamin and mineral supplements as recommended by your health care provider. What  happens during an annual well check? The services and screenings done by your health care provider during your annual well check will depend on your age, overall health, lifestyle risk factors, and family history of disease. Counseling  Your health care provider may ask you questions about your: Alcohol use. Tobacco use. Drug use. Emotional well-being. Home and relationship well-being. Sexual activity. Eating habits. History of falls. Memory and ability to understand (cognition). Work and work Astronomer. Reproductive health. Screening  You may have the following tests or measurements: Height, weight, and BMI. Blood pressure. Lipid and cholesterol levels. These may be checked every 5 years, or more frequently if you are over 82 years old. Skin check. Lung cancer screening. You may have this screening every year starting at age 31 if you have a 30-pack-year history of smoking and currently smoke or have quit within the past 15 years. Fecal occult blood test (FOBT) of the stool. You may have this test every year starting at age 2. Flexible sigmoidoscopy or colonoscopy. You may have a sigmoidoscopy every 5 years or a colonoscopy every 10 years starting at age 25. Hepatitis C blood test. Hepatitis B blood test. Sexually transmitted disease (STD) testing. Diabetes screening. This is done by checking your blood sugar (glucose) after you have not eaten for a while (fasting). You may have this done every 1-3 years. Bone density scan. This is done to screen for osteoporosis. You may have this done starting at age 44. Mammogram. This may  be done every 1-2 years. Talk to your health care provider about how often you should have regular mammograms. Talk with your health care provider about your test results, treatment options, and if necessary, the need for more tests. Vaccines  Your health care provider may recommend certain vaccines, such as: Influenza vaccine. This is recommended every  year. Tetanus, diphtheria, and acellular pertussis (Tdap, Td) vaccine. You may need a Td booster every 10 years. Zoster vaccine. You may need this after age 25. Pneumococcal 13-valent conjugate (PCV13) vaccine. One dose is recommended after age 6. Pneumococcal polysaccharide (PPSV23) vaccine. One dose is recommended after age 52. Talk to your health care provider about which screenings and vaccines you need and how often you need them. This information is not intended to replace advice given to you by your health care provider. Make sure you discuss any questions you have with your health care provider. Document Released: 06/27/2015 Document Revised: 02/18/2016 Document Reviewed: 04/01/2015 Elsevier Interactive Patient Education  2017 [employer] Prevention in the Home Falls can cause injuries. They can happen to people of all ages. There are many things you can do to make your home safe and to help prevent falls. What can I do on the outside of my home? Regularly fix the edges of walkways and driveways and fix any cracks. Remove anything that might make you trip as you walk through a door, such as a raised step or threshold. Trim any bushes or trees on the path to your home. Use bright outdoor lighting. Clear any walking paths of anything that might make someone trip, such as rocks or tools. Regularly check to see if handrails are loose or broken. Make sure that both sides of any steps have handrails. Any raised decks and porches should have guardrails on the edges. Have any leaves, snow, or ice cleared regularly. Use sand or salt on walking paths during winter. Clean up any spills in your garage right away. This includes oil or grease spills. What can I do in the bathroom? Use night lights. Install grab bars by the toilet and in the tub and shower. Do not use towel bars as grab bars. Use non-skid mats or decals in the tub or shower. If you need to sit down in the shower, use a  plastic, non-slip stool. Keep the floor dry. Clean up any water that spills on the floor as soon as it happens. Remove soap buildup in the tub or shower regularly. Attach bath mats securely with double-sided non-slip rug tape. Do not have throw rugs and other things on the floor that can make you trip. What can I do in the bedroom? Use night lights. Make sure that you have a light by your bed that is easy to reach. Do not use any sheets or blankets that are too big for your bed. They should not hang down onto the floor. Have a firm chair that has side arms. You can use this for support while you get dressed. Do not have throw rugs and other things on the floor that can make you trip. What can I do in the kitchen? Clean up any spills right away. Avoid walking on wet floors. Keep items that you use a lot in easy-to-reach places. If you need to reach something above you, use a strong step stool that has a grab bar. Keep electrical cords out of the way. Do not use floor polish or wax that makes floors slippery. If you must use  wax, use non-skid floor wax. Do not have throw rugs and other things on the floor that can make you trip. What can I do with my stairs? Do not leave any items on the stairs. Make sure that there are handrails on both sides of the stairs and use them. Fix handrails that are broken or loose. Make sure that handrails are as long as the stairways. Check any carpeting to make sure that it is firmly attached to the stairs. Fix any carpet that is loose or worn. Avoid having throw rugs at the top or bottom of the stairs. If you do have throw rugs, attach them to the floor with carpet tape. Make sure that you have a light switch at the top of the stairs and the bottom of the stairs. If you do not have them, ask someone to add them for you. What else can I do to help prevent falls? Wear shoes that: Do not have high heels. Have rubber bottoms. Are comfortable and fit you  well. Are closed at the toe. Do not wear sandals. If you use a stepladder: Make sure that it is fully opened. Do not climb a closed stepladder. Make sure that both sides of the stepladder are locked into place. Ask someone to hold it for you, if possible. Clearly mark and make sure that you can see: Any grab bars or handrails. First and last steps. Where the edge of each step is. Use tools that help you move around (mobility aids) if they are needed. These include: Canes. Walkers. Scooters. Crutches. Turn on the lights when you go into a dark area. Replace any light bulbs as soon as they burn out. Set up your furniture so you have a clear path. Avoid moving your furniture around. If any of your floors are uneven, fix them. If there are any pets around you, be aware of where they are. Review your medicines with your doctor. Some medicines can make you feel dizzy. This can increase your chance of falling. Ask your doctor what other things that you can do to help prevent falls. This information is not intended to replace advice given to you by your health care provider. Make sure you discuss any questions you have with your health care provider. Document Released: 03/27/2009 Document Revised: 11/06/2015 Document Reviewed: 07/05/2014 Elsevier Interactive Patient Education  2017 [employer].

## 2022-10-29 ENCOUNTER — Other Ambulatory Visit (HOSPITAL_BASED_OUTPATIENT_CLINIC_OR_DEPARTMENT_OTHER): Payer: Self-pay

## 2022-11-14 NOTE — Assessment & Plan Note (Signed)
hgba1c acceptable, minimize simple carbs. Increase exercise as tolerated. Continue current meds 

## 2022-11-14 NOTE — Assessment & Plan Note (Signed)
Well controlled, no changes to meds. Encouraged heart healthy diet such as the DASH diet and exercise as tolerated.  °

## 2022-11-14 NOTE — Assessment & Plan Note (Signed)
Tolerating statin, encouraged heart healthy diet, avoid trans fats, minimize simple carbs and saturated fats. Increase exercise as tolerated 

## 2022-11-15 ENCOUNTER — Ambulatory Visit (INDEPENDENT_AMBULATORY_CARE_PROVIDER_SITE_OTHER): Payer: PPO | Admitting: Family Medicine

## 2022-11-15 ENCOUNTER — Ambulatory Visit (HOSPITAL_BASED_OUTPATIENT_CLINIC_OR_DEPARTMENT_OTHER)
Admission: RE | Admit: 2022-11-15 | Discharge: 2022-11-15 | Disposition: A | Discharge status: Home / Self Care | Payer: PPO | Source: Ambulatory Visit | Attending: Family Medicine | Admitting: Family Medicine

## 2022-11-15 VITALS — BP 122/74 | HR 71 | Temp 97.8°F | Resp 16 | Ht 62.0 in | Wt 182.2 lb

## 2022-11-15 DIAGNOSIS — I10 Essential (primary) hypertension: Secondary | ICD-10-CM | POA: Diagnosis not present

## 2022-11-15 DIAGNOSIS — R109 Unspecified abdominal pain: Secondary | ICD-10-CM | POA: Diagnosis not present

## 2022-11-15 DIAGNOSIS — Z7985 Long-term (current) use of injectable non-insulin antidiabetic drugs: Secondary | ICD-10-CM

## 2022-11-15 DIAGNOSIS — E1165 Type 2 diabetes mellitus with hyperglycemia: Secondary | ICD-10-CM | POA: Diagnosis not present

## 2022-11-15 DIAGNOSIS — K5900 Constipation, unspecified: Secondary | ICD-10-CM | POA: Insufficient documentation

## 2022-11-15 DIAGNOSIS — E782 Mixed hyperlipidemia: Secondary | ICD-10-CM | POA: Diagnosis not present

## 2022-11-15 LAB — COMPREHENSIVE METABOLIC PANEL
ALT: 27 U/L (ref 0–35)
AST: 23 U/L (ref 0–37)
Albumin: 4.1 g/dL (ref 3.5–5.2)
Alkaline Phosphatase: 71 U/L (ref 39–117)
BUN: 16 mg/dL (ref 6–23)
CO2: 31 mEq/L (ref 19–32)
Calcium: 9.5 mg/dL (ref 8.4–10.5)
Chloride: 101 mEq/L (ref 96–112)
Creatinine, Ser: 0.97 mg/dL (ref 0.40–1.20)
GFR: 59.34 mL/min — ABNORMAL LOW (ref >60.00)
Glucose, Bld: 101 mg/dL — ABNORMAL HIGH (ref 70–99)
Potassium: 4.1 mEq/L (ref 3.5–5.1)
Sodium: 141 mEq/L (ref 135–145)
Total Bilirubin: 0.4 mg/dL (ref 0.2–1.2)
Total Protein: 6.8 g/dL (ref 6.0–8.3)

## 2022-11-15 LAB — LIPID PANEL
Cholesterol: 123 mg/dL (ref 0–200)
HDL: 43.1 mg/dL (ref >39.00)
LDL Cholesterol: 59 mg/dL (ref 0–99)
NonHDL: 79.91
Total CHOL/HDL Ratio: 3
Triglycerides: 107 mg/dL (ref 0.0–149.0)
VLDL: 21.4 mg/dL (ref 0.0–40.0)

## 2022-11-15 LAB — CBC WITH DIFFERENTIAL/PLATELET
Basophils Absolute: 0 10*3/uL (ref 0.0–0.1)
Basophils Relative: 0.6 % (ref 0.0–3.0)
Eosinophils Absolute: 0.1 10*3/uL (ref 0.0–0.7)
Eosinophils Relative: 1.4 % (ref 0.0–5.0)
HCT: 38.2 % (ref 36.0–46.0)
Hemoglobin: 12.4 g/dL (ref 12.0–15.0)
Lymphocytes Relative: 27.1 % (ref 12.0–46.0)
Lymphs Abs: 1.5 10*3/uL (ref 0.7–4.0)
MCHC: 32.4 g/dL (ref 30.0–36.0)
MCV: 79.1 fl (ref 78.0–100.0)
Monocytes Absolute: 0.5 10*3/uL (ref 0.1–1.0)
Monocytes Relative: 8.9 % (ref 3.0–12.0)
Neutro Abs: 3.5 10*3/uL (ref 1.4–7.7)
Neutrophils Relative %: 62 % (ref 43.0–77.0)
Platelets: 250 10*3/uL (ref 150.0–400.0)
RBC: 4.83 Mil/uL (ref 3.87–5.11)
RDW: 14.1 % (ref 11.5–15.5)
WBC: 5.7 10*3/uL (ref 4.0–10.5)

## 2022-11-15 LAB — HEMOGLOBIN A1C: Hgb A1c MFr Bld: 7.1 % — ABNORMAL HIGH (ref 4.6–6.5)

## 2022-11-15 LAB — TSH: TSH: 0.87 u[IU]/mL (ref 0.35–5.50)

## 2022-11-15 NOTE — Patient Instructions (Signed)
Encouraged increased hydration and fiber in diet. Daily probiotics. If bowels not moving can use MOM 2 tbls po in 4 oz of warm prune juice by mouth every 2-3 days. If no results then repeat in 4 hours with  Dulcolax suppository pr, may repeat again in 4 more hours as needed. Seek care if symptoms worsen. Consider daily Miralax and/or Dulcolax if symptoms persist.    Miralax daily and consider Pericolace or Senna S

## 2022-11-22 ENCOUNTER — Encounter: Payer: Self-pay | Admitting: Family Medicine

## 2022-11-22 NOTE — Progress Notes (Signed)
Subjective:    Patient ID: [name], female    DOB: 1951-05-20, 72 y.o.   MRN: [id number]  Chief Complaint  Patient presents with   Follow-up    Follow up    HPI Discussed the use of AI scribe software for clinical note transcription with the patient, who gave verbal consent to proceed.  History of Present Illness   The patient, with a history of diabetes, presents with a chief complaint of abdominal pain that has been occurring for about two weeks. The pain is described as a gnawing sensation that starts in the upper abdomen and wraps around to the back, and also extends to the lower abdomen. The pain typically starts around 3:30 am and lasts for a couple of hours, reaching a severity of 10 on a scale of 0 to 10. The patient also reports constipation since starting Ozempic for diabetes management.  In addition to the abdominal pain, the patient reports fluctuations in blood sugar levels, with the highest being around 200 and the lowest being 64. The patient has been managing these fluctuations with glucose tablets and dietary adjustments.  The patient also reports experiencing dizziness, which is described as a woozy feeling that is particularly noticeable when moving quickly or turning. The patient also mentions jaw pain, which has been previously evaluated by an ear, nose, and throat specialist.        Past Medical History:  Diagnosis Date   Acute bronchitis 10/31/2015   Allergy    seasonal   Arthritis    knee-left   Cancer (HCC) 04/2018   right breast ca   Cataract    right eye   Diabetes mellitus 52   type 2   Diaphragmatic hernia without mention of obstruction or gangrene    Endometriosis    Esophageal reflux    Esophageal stricture    GERD (gastroesophageal reflux disease)    HOCM (hypertrophic obstructive cardiomyopathy) (HCC)    Basal septal hypertrophy 16 mm with increased ECV at 30% on cardiac MRI 06/2021 consistent with HOCM   Hyperlipidemia     Hyperplastic colon polyp    Hypertension 52   Nonalcoholic fatty liver disease 04/28/2010   Qualifier: Diagnosis of  By: Lovell Sheehan MD, Balinda Quails    Nonspecific abnormal results of liver function study    NSVT (nonsustained ventricular tachycardia) (HCC)    noted on event monitor 05/2021   Personal history of radiation therapy 08/2018   bilateral breasts   Pharyngitis 02/18/2012   Plantar fascial fibromatosis    Preventative health care 01/22/2013   Seasonal allergies 10/31/2015   Sleep apnea    has oral appliance but does not fit well due to missing teeth   Sore throat 04/03/2017   Tubular adenoma of colon    Unspecified sleep apnea    no c-pap    Past Surgical History:  Procedure Laterality Date   ABDOMINAL HYSTERECTOMY  1982   partial   APPENDECTOMY     benigh cyst in lung  2006   right   BREAST BIOPSY Bilateral 04/03/2018   BREAST BIOPSY Bilateral 03/23/2018   BREAST CYST INCISION AND DRAINAGE     right breast   BREAST EXCISIONAL BIOPSY Right 2019   BREAST LUMPECTOMY Bilateral 04/2018   BREAST LUMPECTOMY WITH RADIOACTIVE SEED LOCALIZATION Bilateral 05/02/2018   Procedure: RIGHT BREAST SEED LUMPECTOMY X2 AND LEFT BREAST SEED GUIDED LUMPECTOMY;  Surgeon: Almond Lint, MD;  Location: [facility];  Service: General;  Laterality: Bilateral;  CATARACT EXTRACTION     right eye   HERNIA REPAIR     per pt, she is not aware of this surgery   RE-EXCISION OF BREAST LUMPECTOMY Right 05/25/2018   Procedure: RE-EXCISION OF RIGHT BREAST LUMPECTOMY;  Surgeon: Almond Lint, MD;  Location: [facility];  Service: General;  Laterality: Right;   seba     TUBAL LIGATION      Family History  Problem Relation Age of Onset   Breast cancer Mother    Hypertension Mother    Diabetes Mother        type 2   Other Mother        esophagus surgery   Heart disease Father    Hyperlipidemia Father    Hypertension Father    Heart attack Father        MI at age 64    Diabetes Brother        type 2   Heart disease Maternal Grandmother    Heart attack Maternal Grandfather    Hypertension Maternal Grandfather    Stroke Paternal Grandmother    Hypertension Sister    Diabetes Sister    Hypertension Brother    Alcohol abuse Maternal Aunt    Cancer Maternal Aunt    Colon cancer Neg Hx    Stomach cancer Neg Hx    Esophageal cancer Neg Hx     Social History   Socioeconomic History   Marital status: Married    Spouse name: Financial trader   Number of children: 2   Years of education: Not on file   Highest education level: GED or equivalent  Occupational History   Occupation: Insurance risk surveyor: [employer]  Tobacco Use   Smoking status: Never   Smokeless tobacco: Never  Substance and Sexual Activity   Alcohol use: No    Alcohol/week: 0.0 standard drinks of alcohol   Drug use: No   Sexual activity: Yes    Birth control/protection: Post-menopausal    Comment: lives with husband, no dietary restrictions just watching carbs, wears seat belt  Other Topics Concern   Not on file  Social History Narrative   Not on file   Social Determinants of Health   Financial Resource Strain: Low Risk  (11/08/2022)   Overall Financial Resource Strain (CARDIA)    Difficulty of Paying Living Expenses: Not hard at all  Food Insecurity: No Food Insecurity (11/08/2022)   Hunger Vital Sign    Worried About Running Out of Food in the Last Year: Never true    Ran Out of Food in the Last Year: Never true  Transportation Needs: No Transportation Needs (11/08/2022)   PRAPARE - Administrator, Civil Service (Medical): No    Lack of Transportation (Non-Medical): No  Physical Activity: Inactive (11/08/2022)   Exercise Vital Sign    Days of Exercise per Week: 0 days    Minutes of Exercise per Session: 0 min  Stress: No Stress Concern Present (11/08/2022)   [employer] of Occupational Health - Occupational Stress Questionnaire    Feeling of Stress : Only  a little  Social Connections: Socially Integrated (11/08/2022)   Social Connection and Isolation Panel [NHANES]    Frequency of Communication with Friends and Family: More than three times a week    Frequency of Social Gatherings with Friends and Family: Three times a week    Attends Religious Services: More than 4 times per year    Active Member of  Clubs or Organizations: Yes    Attends Banker Meetings: More than 4 times per year    Marital Status: Married  Catering manager Violence: Not At Risk (10/15/2022)   Humiliation, Afraid, Rape, and Kick questionnaire    Fear of Current or Ex-Partner: No    Emotionally Abused: No    Physically Abused: No    Sexually Abused: No    Outpatient Medications Prior to Visit  Medication Sig Dispense Refill   albuterol (VENTOLIN HFA) 108 (90 Base) MCG/ACT inhaler Inhale 2 puffs by mouth into the lungs every 6 (six) hours as needed for wheezing or shortness of breath. 6.7 g 0   ASPIR-LOW 81 MG EC tablet TAKE 1 TABLET BY MOUTH DAILY 30 tablet 3   bisoprolol-hydrochlorothiazide (ZIAC) 5-6.25 MG tablet Take 1 tablet by mouth daily. 90 tablet 1   Continuous Glucose Sensor (FREESTYLE LIBRE 3 SENSOR) MISC Inject 1 sensor into the skin every 14 (fourteen) days. Use to check blood glucose continuously. 2 each 5   fexofenadine (SM FEXOFENADINE HCL) 180 MG tablet Take 1 tablet (180 mg total) by mouth daily. 30 tablet 1   fluticasone (FLONASE) 50 MCG/ACT nasal spray Place 2 sprays into both nostrils daily. 16 g 5   glucose blood test strip OneTouch Verio test strips 100 each 2   Magnesium Oxide 400 MG CAPS Take 1 capsule (400 mg total) by mouth daily. 90 capsule 3   MELATONIN PO Take 10 mg by mouth at bedtime.     metFORMIN (GLUCOPHAGE) 1000 MG tablet Take 1 tablet (1,000 mg total) by mouth 2 (two) times daily with a meal. 180 tablet 3   montelukast (SINGULAIR) 10 MG tablet Take 1 tablet (10 mg total) by mouth at bedtime as needed. 30 tablet 3    omeprazole (PRILOSEC) 40 MG capsule Take 1 capsule (40 mg total) by mouth 2 (two) times daily. 180 capsule 1   repaglinide (PRANDIN) 0.5 MG tablet Take 2 tablets (1 mg total) by mouth 2 (two) times daily before a meal. 360 tablet 2   rosuvastatin (CRESTOR) 5 MG tablet Take 1 tablet (5 mg total) by mouth daily. 90 tablet 1   Semaglutide,0.25 or 0.5MG /DOS, 2 MG/3ML SOPN Inject 0.5 mg into the skin once a week. 9 mL 3   traZODone (DESYREL) 50 MG tablet Take 1/2 - 1 tablet (25-50 mg total) by mouth at bedtime as needed for sleep. 30 tablet 3   Facility-Administered Medications Prior to Visit  Medication Dose Route Frequency Provider Last Rate Last Admin   Chlorhexidine Gluconate Cloth 2 % PADS 6 each  6 each Topical Once Almond Lint, MD       And   Chlorhexidine Gluconate Cloth 2 % PADS 6 each  6 each Topical Once Almond Lint, MD        Allergies  Allergen Reactions   Pravastatin Other (See Comments)    Myalgia, fatigue, memory loss   Amoxicillin Itching    REACTION: rash   Atorvastatin Other (See Comments)    Myalgia, fatigue, memory loss   Codeine Nausea And Vomiting    Review of Systems  Constitutional:  Negative for fever and malaise/fatigue.  HENT:  Negative for congestion.   Eyes:  Negative for blurred vision.  Respiratory:  Negative for shortness of breath.   Cardiovascular:  Negative for chest pain, palpitations and leg swelling.  Gastrointestinal:  Positive for abdominal pain and constipation. Negative for blood in stool and nausea.  Genitourinary:  Negative for dysuria  and frequency.  Musculoskeletal:  Positive for joint pain. Negative for falls.  Skin:  Negative for rash.  Neurological:  Positive for dizziness. Negative for loss of consciousness and headaches.  Endo/Heme/Allergies:  Negative for environmental allergies.  Psychiatric/Behavioral:  Negative for depression. The patient is not nervous/anxious.        Objective:    Physical Exam Constitutional:       General: She is not in acute distress.    Appearance: Normal appearance. She is well-developed. She is not toxic-appearing.  HENT:     Head: Normocephalic and atraumatic.     Right Ear: External ear normal.     Left Ear: External ear normal.     Nose: Nose normal.  Eyes:     General:        Right eye: No discharge.        Left eye: No discharge.     Conjunctiva/sclera: Conjunctivae normal.  Neck:     Thyroid: No thyromegaly.  Cardiovascular:     Rate and Rhythm: Normal rate and regular rhythm.     Heart sounds: Normal heart sounds. No murmur heard. Pulmonary:     Effort: Pulmonary effort is normal. No respiratory distress.     Breath sounds: Normal breath sounds.  Abdominal:     General: Bowel sounds are normal.     Palpations: Abdomen is soft.     Tenderness: There is no abdominal tenderness. There is no guarding.  Musculoskeletal:        General: Normal range of motion.     Cervical back: Neck supple.  Lymphadenopathy:     Cervical: No cervical adenopathy.  Skin:    General: Skin is warm and dry.  Neurological:     Mental Status: She is alert and oriented to person, place, and time.  Psychiatric:        Mood and Affect: Mood normal.        Behavior: Behavior normal.        Thought Content: Thought content normal.        Judgment: Judgment normal.     BP 122/74 (BP Location: Left Arm, Patient Position: Sitting, Cuff Size: Normal)   Pulse 71   Temp 97.8 F (36.6 C) (Oral)   Resp 16   Ht 5\' 2"  (1.575 m)   Wt 182 lb 3.2 oz (82.6 kg)   SpO2 97%   BMI 33.32 kg/m  Wt Readings from Last 3 Encounters:  11/15/22 182 lb 3.2 oz (82.6 kg)  10/15/22 183 lb (83 kg)  08/06/22 183 lb (83 kg)    Diabetic Foot Exam - Simple   No data filed    Lab Results  Component Value Date   WBC 5.7 11/15/2022   HGB 12.4 11/15/2022   HCT 38.2 11/15/2022   PLT 250.0 11/15/2022   GLUCOSE 101 (H) 11/15/2022   CHOL 123 11/15/2022   TRIG 107.0 11/15/2022   HDL 43.10 11/15/2022    LDLDIRECT 96.0 08/19/2020   LDLCALC 59 11/15/2022   ALT 27 11/15/2022   AST 23 11/15/2022   NA 141 11/15/2022   K 4.1 11/15/2022   CL 101 11/15/2022   CREATININE 0.97 11/15/2022   BUN 16 11/15/2022   CO2 31 11/15/2022   TSH 0.87 11/15/2022   HGBA1C 7.1 (H) 11/15/2022   MICROALBUR <0.7 05/10/2022    Lab Results  Component Value Date   TSH 0.87 11/15/2022   Lab Results  Component Value Date   WBC 5.7 11/15/2022  HGB 12.4 11/15/2022   HCT 38.2 11/15/2022   MCV 79.1 11/15/2022   PLT 250.0 11/15/2022   Lab Results  Component Value Date   NA 141 11/15/2022   K 4.1 11/15/2022   CO2 31 11/15/2022   GLUCOSE 101 (H) 11/15/2022   BUN 16 11/15/2022   CREATININE 0.97 11/15/2022   BILITOT 0.4 11/15/2022   ALKPHOS 71 11/15/2022   AST 23 11/15/2022   ALT 27 11/15/2022   PROT 6.8 11/15/2022   ALBUMIN 4.1 11/15/2022   CALCIUM 9.5 11/15/2022   ANIONGAP 11 01/15/2021   EGFR 60 05/29/2021   GFR 59.34 (L) 11/15/2022   Lab Results  Component Value Date   CHOL 123 11/15/2022   Lab Results  Component Value Date   HDL 43.10 11/15/2022   Lab Results  Component Value Date   LDLCALC 59 11/15/2022   Lab Results  Component Value Date   TRIG 107.0 11/15/2022   Lab Results  Component Value Date   CHOLHDL 3 11/15/2022   Lab Results  Component Value Date   HGBA1C 7.1 (H) 11/15/2022       Assessment & Plan:  Essential hypertension Assessment & Plan: Well controlled, no changes to meds. Encouraged heart healthy diet such as the DASH diet and exercise as tolerated.   Orders: -     CBC with Differential/Platelet -     Comprehensive metabolic panel -     TSH  Hyperlipidemia, mixed Assessment & Plan: Tolerating statin, encouraged heart healthy diet, avoid trans fats, minimize simple carbs and saturated fats. Increase exercise as tolerated  Orders: -     Lipid panel  Type 2 diabetes mellitus with hyperglycemia, without long-term current use of insulin (HCC) Assessment  & Plan: hgba1c acceptable, minimize simple carbs. Increase exercise as tolerated. Continue current meds  Orders: -     Hemoglobin A1c  Constipation, unspecified constipation type -     DG Abd 1 View; Future    Assessment and Plan    Abdominal Pain: Epigastric pain radiating to the back and lower abdomen, described as a gnawing sensation. Constipation since starting Ozempic. No blood in stool, no black sticky stool, no nausea vomiting. Physical exam unremarkable. -Order abdominal X-ray today. -Order CT scan of abdomen pending X-ray results. -Start Miralax half cap daily for a few days, then increase to full cap daily. -Consider adding fiber supplement and stool softener laxative if needed. -Consider trial off Ozempic if pain does not improve with bowel regimen.  Type 2 Diabetes Mellitus: Patient on Ozempic, experiencing hypoglycemic episodes at night. A1C last measured at 8.7. -Check A1C today. -Consider alternative medication if Ozempic is discontinued due to abdominal pain.  Dizziness: Described as a woozy feeling, worse with quick movements. No vertigo. Previously seen by ENT specialist. -Recommend follow-up with ENT specialist for further workup. -Consider physical therapy if no specific cause identified.  TMJ: Complaints of jaw pain and tenderness. -Recommend ice and lidocaine gel before bedtime. -Consider consultation with dentist for night guard.  General Health Maintenance: -Encourage adequate hydration, monitor tongue for dryness as a gauge. -Continue healthy diet and physical activity for weight management. -Follow-up in a few months or sooner if abdominal pain does not improve.         Danise Edge, MD

## 2022-12-06 ENCOUNTER — Encounter: Payer: Self-pay | Admitting: Internal Medicine

## 2022-12-06 ENCOUNTER — Other Ambulatory Visit (HOSPITAL_BASED_OUTPATIENT_CLINIC_OR_DEPARTMENT_OTHER): Payer: Self-pay

## 2022-12-06 ENCOUNTER — Other Ambulatory Visit: Payer: Self-pay | Admitting: Physician Assistant

## 2022-12-07 ENCOUNTER — Other Ambulatory Visit: Payer: Self-pay

## 2022-12-07 ENCOUNTER — Other Ambulatory Visit (HOSPITAL_BASED_OUTPATIENT_CLINIC_OR_DEPARTMENT_OTHER): Payer: Self-pay

## 2022-12-13 ENCOUNTER — Other Ambulatory Visit: Payer: Self-pay | Admitting: Physician Assistant

## 2022-12-13 ENCOUNTER — Other Ambulatory Visit: Payer: Self-pay | Admitting: Family Medicine

## 2022-12-13 ENCOUNTER — Other Ambulatory Visit (HOSPITAL_BASED_OUTPATIENT_CLINIC_OR_DEPARTMENT_OTHER): Payer: Self-pay

## 2022-12-13 MED ORDER — FREESTYLE LIBRE 3 SENSOR MISC
1.0000 | 5 refills | Status: DC
  Filled 2022-12-13: qty 2, 28d supply, fill #0
  Filled 2023-01-10: qty 2, 28d supply, fill #1
  Filled 2023-02-07: qty 2, 28d supply, fill #2
  Filled 2023-03-10: qty 2, 28d supply, fill #3
  Filled 2023-04-12: qty 2, 28d supply, fill #4
  Filled 2023-05-10: qty 2, 28d supply, fill #5

## 2022-12-13 MED ORDER — OMEPRAZOLE 40 MG PO CPDR
40.0000 mg | DELAYED_RELEASE_CAPSULE | Freq: Two times a day (BID) | ORAL | 0 refills | Status: DC
  Filled 2022-12-13: qty 90, 45d supply, fill #0

## 2022-12-30 ENCOUNTER — Encounter: Payer: Self-pay | Admitting: Family Medicine

## 2023-01-05 ENCOUNTER — Other Ambulatory Visit (HOSPITAL_BASED_OUTPATIENT_CLINIC_OR_DEPARTMENT_OTHER): Payer: Self-pay

## 2023-01-06 ENCOUNTER — Other Ambulatory Visit: Payer: Self-pay

## 2023-01-06 ENCOUNTER — Other Ambulatory Visit (HOSPITAL_BASED_OUTPATIENT_CLINIC_OR_DEPARTMENT_OTHER): Payer: Self-pay

## 2023-01-10 ENCOUNTER — Other Ambulatory Visit (HOSPITAL_BASED_OUTPATIENT_CLINIC_OR_DEPARTMENT_OTHER): Payer: Self-pay

## 2023-01-10 ENCOUNTER — Encounter: Payer: Self-pay | Admitting: Internal Medicine

## 2023-01-10 ENCOUNTER — Ambulatory Visit: Payer: PPO | Admitting: Internal Medicine

## 2023-01-10 VITALS — BP 120/65 | HR 68 | Ht 62.0 in | Wt 182.2 lb

## 2023-01-10 DIAGNOSIS — E1165 Type 2 diabetes mellitus with hyperglycemia: Secondary | ICD-10-CM

## 2023-01-10 DIAGNOSIS — Z7985 Long-term (current) use of injectable non-insulin antidiabetic drugs: Secondary | ICD-10-CM | POA: Diagnosis not present

## 2023-01-10 DIAGNOSIS — Z7984 Long term (current) use of oral hypoglycemic drugs: Secondary | ICD-10-CM

## 2023-01-10 MED ORDER — METFORMIN HCL 1000 MG PO TABS
1000.0000 mg | ORAL_TABLET | Freq: Two times a day (BID) | ORAL | 3 refills | Status: DC
  Filled 2023-01-10: qty 180, 90d supply, fill #0
  Filled 2023-05-10: qty 180, 90d supply, fill #1

## 2023-01-10 NOTE — Patient Instructions (Addendum)
-   STOP Repaglinide - Continue Ozempic 0.5 mg once weekly   - Continue Metformin 1000 mg , 1 tablet with breakfast and 1 tablet with dinner   Please use daily stool softeners and magnesium tablets to help with the constipation   HOW TO TREAT LOW BLOOD SUGARS (Blood sugar LESS THAN 70 MG/DL) Please follow the RULE OF 15 for the treatment of hypoglycemia treatment (when your (blood sugars are less than 70 mg/dL)   STEP 1: Take 15 grams of carbohydrates when your blood sugar is low, which includes:  3-4 GLUCOSE TABS  OR 3-4 OZ OF JUICE OR REGULAR SODA OR ONE TUBE OF GLUCOSE GEL    STEP 2: RECHECK blood sugar in 15 MINUTES STEP 3: If your blood sugar is still low at the 15 minute recheck --> then, go back to STEP 1 and treat AGAIN with another 15 grams of carbohydrates.

## 2023-01-10 NOTE — Progress Notes (Signed)
Name: [name]  Age/ Sex: 72 y.o., female   MRN/ DOB: [id number], 1951/05/22     PCP: Bradd Canary, MD   Reason for Endocrinology Evaluation: Type 2 Diabetes Mellitus  Initial Endocrine Consultative Visit: 02/22/2017    PATIENT IDENTIFIER: [name] is a 72 y.o. female with a past medical history of T2DM, HTN , Dyslipidemia, Hx of breast Ca ( S/P right lumpectomy) . The patient has followed with Endocrinology clinic since 02/16/2019 for consultative assistance with management of her diabetes.     DIABETIC HISTORY:  [name] was diagnosed with T2DM in 2001. She has been on metformin, Glimepiride and Januvia. Invokana added in 2018 but was cost prohibitive. Her hemoglobin A1c has ranged from 7.0% in 2016, peaking at 8.8% in  2021.    She used to see Dr.Gherghe but was lost to follow up by 2019 , until her return to our clinic 08/2019.    Started Rybelsus 08/2020 but this had to be stopped by the end of 2022 due to inability to qualify for pt assistance   Switched glipizide to repaglinide 01/2022 Started Ozempic 07/2022  SUBJECTIVE:   During the last visit (08/06/2022):  A1c 8.7%     Today (01/10/2023): [name] is here for a follow up on diabetes management.  She checks her blood sugars 2 times daily The patient has had hypoglycemic episodes since the last clinic visit, which typically occur during the day , she has not been symptomatic    She eats twice a day , Breakfast and Supper ,sometimes she snacks for lunch   She has an upcoming eye appointment for itching  of the eyes  She has noted abdominal pain , denies nausea or vomiting  Has noted constipation but no diarrhea , she is on MOM and miralax     HOME DIABETES REGIMEN:  Metformin 1000 mg, BID Repaglinide 0.5 mg, 1 tab before breakfast  Ozempic 0.5 mg weekly   Statin:  Crestor ACE-I/ARB: No   METER DOWNLOAD SUMMARY: 7/16-7/29/2024 Fingerstick Blood Glucose Tests = 27 Overall Mean  FS Glucose = 116 Standard Deviation = 22  BG Ranges: Low = 78 High = 185  BG Target % Results: % In target = 96 % Over target = 4 % Under target = 0  Hypoglycemic Events/30 Days: BG < 50 = 0 Episodes of symptomatic severe hypoglycemia = 0  CONTINUOUS GLUCOSE MONITORING RECORD INTERPRETATION    Dates of Recording: 7/16-7/29/2024  Sensor description:freestyle  Results statistics:   CGM use % of time 82  Average and SD 126/21.6  Time in range    95    %  % Time Above 180 5  % Time above 250 0  % Time Below target 0   Glycemic patterns summary: BG's are optimal for the day and the night  Hyperglycemic episodes postprandial  Hypoglycemic episodes occurred after breakfast  Overnight periods: Optimal   DIABETIC COMPLICATIONS: Microvascular complications:   Denies: CKD, retinopathy , neuropathy Last Eye Exam: Completed 2023  Macrovascular complications:   Denies: CAD, CVA, PVD   HISTORY:  Past Medical History:  Past Medical History:  Diagnosis Date   Acute bronchitis 10/31/2015   Allergy    seasonal   Arthritis    knee-left   Cancer (HCC) 04/2018   right breast ca   Cataract    right eye   Diabetes mellitus 52   type 2   Diaphragmatic hernia without mention of  obstruction or gangrene    Endometriosis    Esophageal reflux    Esophageal stricture    GERD (gastroesophageal reflux disease)    HOCM (hypertrophic obstructive cardiomyopathy) (HCC)    Basal septal hypertrophy 16 mm with increased ECV at 30% on cardiac MRI 06/2021 consistent with HOCM   Hyperlipidemia    Hyperplastic colon polyp    Hypertension 52   Nonalcoholic fatty liver disease 04/28/2010   Qualifier: Diagnosis of  By: Lovell Sheehan MD, Balinda Quails    Nonspecific abnormal results of liver function study    NSVT (nonsustained ventricular tachycardia) (HCC)    noted on event monitor 05/2021   Personal history of radiation therapy 08/2018   bilateral breasts   Pharyngitis 02/18/2012   Plantar  fascial fibromatosis    Preventative health care 01/22/2013   Seasonal allergies 10/31/2015   Sleep apnea    has oral appliance but does not fit well due to missing teeth   Sore throat 04/03/2017   Tubular adenoma of colon    Unspecified sleep apnea    no c-pap   Past Surgical History:  Past Surgical History:  Procedure Laterality Date   ABDOMINAL HYSTERECTOMY  1982   partial   APPENDECTOMY     benigh cyst in lung  2006   right   BREAST BIOPSY Bilateral 04/03/2018   BREAST BIOPSY Bilateral 03/23/2018   BREAST CYST INCISION AND DRAINAGE     right breast   BREAST EXCISIONAL BIOPSY Right 2019   BREAST LUMPECTOMY Bilateral 04/2018   BREAST LUMPECTOMY WITH RADIOACTIVE SEED LOCALIZATION Bilateral 05/02/2018   Procedure: RIGHT BREAST SEED LUMPECTOMY X2 AND LEFT BREAST SEED GUIDED LUMPECTOMY;  Surgeon: Almond Lint, MD;  Location: [facility];  Service: General;  Laterality: Bilateral;   CATARACT EXTRACTION     right eye   HERNIA REPAIR     per pt, she is not aware of this surgery   RE-EXCISION OF BREAST LUMPECTOMY Right 05/25/2018   Procedure: RE-EXCISION OF RIGHT BREAST LUMPECTOMY;  Surgeon: Almond Lint, MD;  Location: [facility];  Service: General;  Laterality: Right;   seba     TUBAL LIGATION     Social History:  reports that she has never smoked. She has never used smokeless tobacco. She reports that she does not drink alcohol and does not use drugs. Family History:  Family History  Problem Relation Age of Onset   Breast cancer Mother    Hypertension Mother    Diabetes Mother        type 2   Other Mother        esophagus surgery   Heart disease Father    Hyperlipidemia Father    Hypertension Father    Heart attack Father        MI at age 79   Diabetes Brother        type 2   Heart disease Maternal Grandmother    Heart attack Maternal Grandfather    Hypertension Maternal Grandfather    Stroke Paternal Grandmother    Hypertension  Sister    Diabetes Sister    Hypertension Brother    Alcohol abuse Maternal Aunt    Cancer Maternal Aunt    Colon cancer Neg Hx    Stomach cancer Neg Hx    Esophageal cancer Neg Hx      HOME MEDICATIONS: Allergies as of 01/10/2023       Reactions   Pravastatin Other (See Comments)   Myalgia, fatigue, memory  loss   Amoxicillin Itching   REACTION: rash   Atorvastatin Other (See Comments)   Myalgia, fatigue, memory loss   Codeine Nausea And Vomiting        Medication List        Accurate as of January 10, 2023 11:56 AM. If you have any questions, ask your nurse or doctor.          albuterol 108 (90 Base) MCG/ACT inhaler Commonly known as: VENTOLIN HFA Inhale 2 puffs by mouth into the lungs every 6 (six) hours as needed for wheezing or shortness of breath.   Aspir-Low 81 MG tablet Generic drug: aspirin EC TAKE 1 TABLET BY MOUTH DAILY   bisoprolol-hydrochlorothiazide 5-6.25 MG tablet Commonly known as: ZIAC Take 1 tablet by mouth daily.   fluticasone 50 MCG/ACT nasal spray Commonly known as: FLONASE Place 2 sprays into both nostrils daily.   FreeStyle Libre 3 Sensor Misc Inject 1 sensor into the skin every 14 (fourteen) days. Use to check blood glucose continuously.   Magnesium Oxide 400 MG Caps Take 1 capsule (400 mg total) by mouth daily.   MELATONIN PO Take 10 mg by mouth at bedtime.   metFORMIN 1000 MG tablet Commonly known as: GLUCOPHAGE Take 1 tablet (1,000 mg total) by mouth 2 (two) times daily with a meal.   montelukast 10 MG tablet Commonly known as: SINGULAIR Take 1 tablet (10 mg total) by mouth at bedtime as needed.   omeprazole 40 MG capsule Commonly known as: PRILOSEC Take 1 capsule (40 mg total) by mouth 2 (two) times daily.   OneTouch Verio test strip Generic drug: glucose blood OneTouch Verio test strips   Ozempic (0.25 or 0.5 MG/DOSE) 2 MG/3ML Sopn Generic drug: Semaglutide(0.25 or 0.5MG /DOS) Inject 0.5 mg into the skin once a  week.   repaglinide 0.5 MG tablet Commonly known as: PRANDIN Take 2 tablets (1 mg total) by mouth 2 (two) times daily before a meal.   rosuvastatin 5 MG tablet Commonly known as: CRESTOR Take 1 tablet (5 mg total) by mouth daily.   SM Fexofenadine HCl 180 MG tablet Generic drug: fexofenadine Take 1 tablet (180 mg total) by mouth daily.         OBJECTIVE:   Vital Signs: BP 120/65   Pulse 68   Ht 5\' 2"  (1.575 m)   Wt 182 lb 3.2 oz (82.6 kg)   SpO2 97%   BMI 33.32 kg/m      Exam: General: Pt appears well and is in NAD  Lungs: Clear with good BS bilat  Heart: RRR   Abdomen: soft, nontender  Extremities: No pretibial edema.   Neuro: MS is good with appropriate affect, pt is alert and Ox3       DM foot exam 08/06/2022  The skin of the feet is intact without sores or ulcerations. The pedal pulses are 2+ on right and 2+ on left. The sensation is intact to a screening 5.07, 10 gram monofilament bilaterally  DATA REVIEWED:  Lab Results  Component Value Date   HGBA1C 7.1 (H) 11/15/2022   HGBA1C 8.7 (A) 08/06/2022   HGBA1C 8.3 (H) 05/10/2022    Latest Reference Range & Units 11/15/22 11:28  Sodium 135 - 145 mEq/L 141  Potassium 3.5 - 5.1 mEq/L 4.1  Chloride 96 - 112 mEq/L 101  CO2 19 - 32 mEq/L 31  Glucose 70 - 99 mg/dL 161 (H)  BUN 6 - 23 mg/dL 16  Creatinine 0.96 - 0.45 mg/dL 4.09  Calcium  8.4 - 10.5 mg/dL 9.5  Alkaline Phosphatase 39 - 117 U/L 71  Albumin 3.5 - 5.2 g/dL 4.1  AST 0 - 37 U/L 23  ALT 0 - 35 U/L 27  Total Protein 6.0 - 8.3 g/dL 6.8     Latest Reference Range & Units 11/15/22 11:28  Total CHOL/HDL Ratio  3  Cholesterol 0 - 200 mg/dL 401  HDL Cholesterol >02.72 mg/dL 53.66  LDL (calc) 0 - 99 mg/dL 59  NonHDL  44.03  Triglycerides 0.0 - 149.0 mg/dL 474.2  VLDL 0.0 - 59.5 mg/dL 63.8     ASSESSMENT / PLAN / RECOMMENDATIONS:   1) Type 2 Diabetes Mellitus, Sub- Optimally Controlled, Without complications - Most recent A1c of 7.1 %.  Goal A1c < 7.0 %.     -A1c has trended down from 8.7% to 7.1% -She is on the coverage For Ozempic, she was provided with patient assistance forms today - She was on Rybelsus and developed headaches with it -She has noted abdominal pain and constipation, patient to use magnesium and stool softeners daily to help with that, will also discussed limiting amount of fried foods , and decreasing portion size -Unable to increase Ozempic dose at this time, but will discontinue repaglinide due to hypoglycemia -Lab results to PCP office have been reviewed to include A1c and CMP   MEDICATIONS: -Repaglinide stop  - Continue Ozempic   0.5 mg weekly -Continue metformin 1000 mg twice daily   EDUCATION / INSTRUCTIONS: BG monitoring instructions: Patient is instructed to check her blood sugars 2 times a day, fasting and supper time. Call [facility] if: BG persistently < 70  I reviewed the Rule of 15 for the treatment of hypoglycemia in detail with the patient. Literature supplied.    2) Diabetic complications:  Eye: Does not have known diabetic retinopathy.  Neuro/ Feet: Does not have known diabetic peripheral neuropathy .  Renal: Patient does not have known baseline CKD. She   is not on an ACEI/ARB at present.  MA/CR.normal   3) Dyslipidemia:   -LDL at goal with most recent lipid panel through PCPs office -Continue atorvastatin     F/U in 6 months   Signed electronically by: Lyndle Herrlich, MD  [facility] Endocrinology  [facility] [address] [place] [place], [place] [address] Phone: [phone] FAX: [phone]   CC: Bradd Canary, MD [address] [place] [address] Phone: [phone]  Fax: [phone]  Return to Endocrinology clinic as below: Future Appointments  Date Time Provider Department Center  02/15/2023 11:40 AM Bradd Canary, MD [id number]

## 2023-01-11 ENCOUNTER — Other Ambulatory Visit: Payer: Self-pay | Admitting: Internal Medicine

## 2023-01-11 ENCOUNTER — Other Ambulatory Visit: Payer: Self-pay

## 2023-01-11 ENCOUNTER — Other Ambulatory Visit (HOSPITAL_BASED_OUTPATIENT_CLINIC_OR_DEPARTMENT_OTHER): Payer: Self-pay

## 2023-01-12 ENCOUNTER — Other Ambulatory Visit (HOSPITAL_BASED_OUTPATIENT_CLINIC_OR_DEPARTMENT_OTHER): Payer: Self-pay

## 2023-01-12 MED ORDER — ONETOUCH VERIO VI STRP
ORAL_STRIP | 2 refills | Status: DC
  Filled 2023-01-12: qty 100, 50d supply, fill #0
  Filled 2023-03-10: qty 100, 50d supply, fill #1
  Filled 2023-05-10: qty 100, 50d supply, fill #2

## 2023-01-14 DIAGNOSIS — E119 Type 2 diabetes mellitus without complications: Secondary | ICD-10-CM | POA: Diagnosis not present

## 2023-01-14 DIAGNOSIS — H524 Presbyopia: Secondary | ICD-10-CM | POA: Diagnosis not present

## 2023-01-14 LAB — HM DIABETES EYE EXAM

## 2023-01-25 ENCOUNTER — Ambulatory Visit: Payer: PPO | Admitting: Internal Medicine

## 2023-01-27 ENCOUNTER — Encounter (INDEPENDENT_AMBULATORY_CARE_PROVIDER_SITE_OTHER): Payer: Self-pay

## 2023-02-07 ENCOUNTER — Other Ambulatory Visit: Payer: Self-pay | Admitting: Physician Assistant

## 2023-02-07 ENCOUNTER — Other Ambulatory Visit (HOSPITAL_BASED_OUTPATIENT_CLINIC_OR_DEPARTMENT_OTHER): Payer: Self-pay

## 2023-02-08 ENCOUNTER — Other Ambulatory Visit: Payer: Self-pay

## 2023-02-08 ENCOUNTER — Other Ambulatory Visit (HOSPITAL_BASED_OUTPATIENT_CLINIC_OR_DEPARTMENT_OTHER): Payer: Self-pay

## 2023-02-14 NOTE — Assessment & Plan Note (Signed)
hgba1c acceptable, minimize simple carbs. Increase exercise as tolerated. Continue current meds 

## 2023-02-14 NOTE — Assessment & Plan Note (Signed)
Tolerating statin, encouraged heart healthy diet, avoid trans fats, minimize simple carbs and saturated fats. Increase exercise as tolerated 

## 2023-02-14 NOTE — Assessment & Plan Note (Signed)
Encouraged good sleep hygiene such as dark, quiet room. No blue/green glowing lights such as computer screens in bedroom. No alcohol or stimulants in evening. Cut down on caffeine as able. Regular exercise is helpful but not just prior to bed time.  

## 2023-02-14 NOTE — Assessment & Plan Note (Signed)
Basal septal hypertrophy 16 mm with increased ECV at 30% on cardiac MRI 06/2021 consistent with HOCM. Doing well following with cardiology

## 2023-02-14 NOTE — Assessment & Plan Note (Signed)
Well controlled, no changes to meds. Encouraged heart healthy diet such as the DASH diet and exercise as tolerated.  °

## 2023-02-15 ENCOUNTER — Telehealth: Payer: Self-pay

## 2023-02-15 ENCOUNTER — Other Ambulatory Visit (HOSPITAL_BASED_OUTPATIENT_CLINIC_OR_DEPARTMENT_OTHER): Payer: Self-pay

## 2023-02-15 ENCOUNTER — Encounter: Payer: Self-pay | Admitting: Family Medicine

## 2023-02-15 ENCOUNTER — Ambulatory Visit (INDEPENDENT_AMBULATORY_CARE_PROVIDER_SITE_OTHER): Payer: PPO | Admitting: Family Medicine

## 2023-02-15 ENCOUNTER — Ambulatory Visit: Payer: PPO | Admitting: Family Medicine

## 2023-02-15 VITALS — BP 126/74 | HR 63 | Temp 98.0°F | Resp 16 | Ht 62.0 in | Wt 183.0 lb

## 2023-02-15 DIAGNOSIS — E119 Type 2 diabetes mellitus without complications: Secondary | ICD-10-CM | POA: Diagnosis not present

## 2023-02-15 DIAGNOSIS — I10 Essential (primary) hypertension: Secondary | ICD-10-CM | POA: Diagnosis not present

## 2023-02-15 DIAGNOSIS — I421 Obstructive hypertrophic cardiomyopathy: Secondary | ICD-10-CM

## 2023-02-15 DIAGNOSIS — G4700 Insomnia, unspecified: Secondary | ICD-10-CM

## 2023-02-15 DIAGNOSIS — E782 Mixed hyperlipidemia: Secondary | ICD-10-CM | POA: Diagnosis not present

## 2023-02-15 DIAGNOSIS — R748 Abnormal levels of other serum enzymes: Secondary | ICD-10-CM | POA: Diagnosis not present

## 2023-02-15 DIAGNOSIS — E1165 Type 2 diabetes mellitus with hyperglycemia: Secondary | ICD-10-CM | POA: Diagnosis not present

## 2023-02-15 DIAGNOSIS — R109 Unspecified abdominal pain: Secondary | ICD-10-CM | POA: Diagnosis not present

## 2023-02-15 LAB — COMPREHENSIVE METABOLIC PANEL
ALT: 26 U/L (ref 0–35)
AST: 20 U/L (ref 0–37)
Albumin: 3.8 g/dL (ref 3.5–5.2)
Alkaline Phosphatase: 76 U/L (ref 39–117)
BUN: 12 mg/dL (ref 6–23)
CO2: 28 meq/L (ref 19–32)
Calcium: 9.5 mg/dL (ref 8.4–10.5)
Chloride: 102 meq/L (ref 96–112)
Creatinine, Ser: 0.95 mg/dL (ref 0.40–1.20)
GFR: 60.73 mL/min (ref >60.00)
Glucose, Bld: 105 mg/dL — ABNORMAL HIGH (ref 70–99)
Potassium: 4.2 meq/L (ref 3.5–5.1)
Sodium: 140 meq/L (ref 135–145)
Total Bilirubin: 0.4 mg/dL (ref 0.2–1.2)
Total Protein: 6.5 g/dL (ref 6.0–8.3)

## 2023-02-15 LAB — CBC WITH DIFFERENTIAL/PLATELET
Basophils Absolute: 0 10*3/uL (ref 0.0–0.1)
Basophils Relative: 0.5 % (ref 0.0–3.0)
Eosinophils Absolute: 0.1 10*3/uL (ref 0.0–0.7)
Eosinophils Relative: 1.3 % (ref 0.0–5.0)
HCT: 37.5 % (ref 36.0–46.0)
Hemoglobin: 12.1 g/dL (ref 12.0–15.0)
Lymphocytes Relative: 34.1 % (ref 12.0–46.0)
Lymphs Abs: 1.8 10*3/uL (ref 0.7–4.0)
MCHC: 32.2 g/dL (ref 30.0–36.0)
MCV: 77.6 fl — ABNORMAL LOW (ref 78.0–100.0)
Monocytes Absolute: 0.4 10*3/uL (ref 0.1–1.0)
Monocytes Relative: 7.1 % (ref 3.0–12.0)
Neutro Abs: 2.9 10*3/uL (ref 1.4–7.7)
Neutrophils Relative %: 57 % (ref 43.0–77.0)
Platelets: 245 10*3/uL (ref 150.0–400.0)
RBC: 4.83 Mil/uL (ref 3.87–5.11)
RDW: 14.7 % (ref 11.5–15.5)
WBC: 5.1 10*3/uL (ref 4.0–10.5)

## 2023-02-15 LAB — MICROALBUMIN / CREATININE URINE RATIO
Creatinine,U: 44.8 mg/dL
Microalb Creat Ratio: 1.6 mg/g (ref 0.0–30.0)
Microalb, Ur: <0.7 mg/dL (ref 0.0–1.9)

## 2023-02-15 LAB — LIPID PANEL
Cholesterol: 120 mg/dL (ref 0–200)
HDL: 38.6 mg/dL — ABNORMAL LOW (ref >39.00)
LDL Cholesterol: 59 mg/dL (ref 0–99)
NonHDL: 80.9
Total CHOL/HDL Ratio: 3
Triglycerides: 111 mg/dL (ref 0.0–149.0)
VLDL: 22.2 mg/dL (ref 0.0–40.0)

## 2023-02-15 LAB — HEMOGLOBIN A1C: Hgb A1c MFr Bld: 7 % — ABNORMAL HIGH (ref 4.6–6.5)

## 2023-02-15 LAB — AMYLASE: Amylase: 53 U/L (ref 27–131)

## 2023-02-15 LAB — LIPASE: Lipase: 68 U/L — ABNORMAL HIGH (ref 11.0–59.0)

## 2023-02-15 MED ORDER — HYOSCYAMINE SULFATE 0.125 MG SL SUBL
0.1250 mg | SUBLINGUAL_TABLET | SUBLINGUAL | 1 refills | Status: DC | PRN
Start: 2023-02-15 — End: 2023-11-14
  Filled 2023-02-15: qty 30, 5d supply, fill #0

## 2023-02-15 NOTE — Patient Instructions (Addendum)
Greggory Keen is the alternative  Covid and Flu boosters in fall  Shingrix is the new shingles shot, 2 shots over 2-6 months, confirm coverage with insurance and document, then can return here for shots with nurse appt or at pharmacy   Hydrate 10 ounces every 1-2 hours and protein every 4 hours  Netflix the Blue Zones

## 2023-02-15 NOTE — Progress Notes (Signed)
   Care Guide Note  02/15/2023 Name: [name] MRN: [id number] DOB: 1950-07-29  Referred by: Bradd Canary, MD Reason for referral : Care Coordination (Outreach to schedule with Pharm d )   [name] is a 72 y.o. year old female who is a primary care patient of Bradd Canary, MD. Joette Catching Cost was referred to the pharmacist for assistance related to DM.    Successful contact was made with the patient to discuss pharmacy services including being ready for the pharmacist to call at least 5 minutes before the scheduled appointment time, to have medication bottles and any blood sugar or blood pressure readings ready for review. The patient agreed to meet with the pharmacist via with the pharmacist via telephone visit on (date/time).  02/21/2023  Penne Lash, RMA Care Guide [facility]  [place], [place] [address] Direct Dial: [phone] [email or web address]

## 2023-02-16 LAB — TSH: TSH: 1.07 u[IU]/mL (ref 0.35–5.50)

## 2023-02-16 LAB — C-PEPTIDE: C-Peptide: 3.3 ng/mL (ref 0.80–3.85)

## 2023-02-16 NOTE — Progress Notes (Signed)
Subjective:    Patient ID: [name], female    DOB: [birth year], 72 y.o.   MRN: [id number]  Chief Complaint  Patient presents with   Follow-up    Follow up    HPI Discussed the use of AI scribe software for clinical note transcription with the patient, who gave verbal consent to proceed.  History of Present Illness   The patient, with a past medical history of fatty liver and constipation, presents with nocturnal abdominal discomfort that typically starts around 3 AM. The discomfort is described as a squeezing sensation located under the rib cage and lower abdomen. The patient reports that the discomfort usually begins after waking up to use the bathroom and makes it difficult to return to sleep. The discomfort tends to subside by morning, around 7 or 8 AM, and does not interfere with daytime activities. The patient suspects that their current medication, Ozempic, might be contributing to the discomfort as the symptoms started after initiating this medication.  In addition to the abdominal discomfort, the patient also experiences pressure in the head, particularly in the morning. The patient manages this with Zyrtec and Flonase, which provide some relief. The patient also reports occasional blood sugar levels ranging from 109 to 190, even without eating. The patient uses a continuous glucose monitor (Libre 3) to monitor their blood sugar levels but also verifies the readings with fingerstick tests as they have noticed discrepancies between the two methods.        Past Medical History:  Diagnosis Date   Acute bronchitis 10/31/2015   Allergy    seasonal   Arthritis    knee-left   Cancer (HCC) 04/2018   right breast ca   Cataract    right eye   Diabetes mellitus 52   type 2   Diaphragmatic hernia without mention of obstruction or gangrene    Endometriosis    Esophageal reflux    Esophageal stricture    GERD (gastroesophageal reflux disease)    HOCM (hypertrophic  obstructive cardiomyopathy) (HCC)    Basal septal hypertrophy 16 mm with increased ECV at 30% on cardiac MRI 06/2021 consistent with HOCM   Hyperlipidemia    Hyperplastic colon polyp    Hypertension 52   Nonalcoholic fatty liver disease 04/28/2010   Qualifier: Diagnosis of  By: Lovell Sheehan MD, John E    Nonspecific abnormal results of liver function study    NSVT (nonsustained ventricular tachycardia) (HCC)    noted on event monitor 05/2021   Personal history of radiation therapy 08/2018   bilateral breasts   Pharyngitis 02/18/2012   Plantar fascial fibromatosis    Preventative health care 01/22/2013   Seasonal allergies 10/31/2015   Sleep apnea    has oral appliance but does not fit well due to missing teeth   Sore throat 04/03/2017   Tubular adenoma of colon    Unspecified sleep apnea    no c-pap    Past Surgical History:  Procedure Laterality Date   ABDOMINAL HYSTERECTOMY  1982   partial   APPENDECTOMY     benigh cyst in lung  2006   right   BREAST BIOPSY Bilateral 04/03/2018   BREAST BIOPSY Bilateral 03/23/2018   BREAST CYST INCISION AND DRAINAGE     right breast   BREAST EXCISIONAL BIOPSY Right 2019   BREAST LUMPECTOMY Bilateral 04/2018   BREAST LUMPECTOMY WITH RADIOACTIVE SEED LOCALIZATION Bilateral 05/02/2018   Procedure: RIGHT BREAST SEED LUMPECTOMY X2 AND LEFT BREAST SEED GUIDED LUMPECTOMY;  Surgeon:  Almond Lint, MD;  Location: [facility];  Service: General;  Laterality: Bilateral;   CATARACT EXTRACTION     right eye   HERNIA REPAIR     per pt, she is not aware of this surgery   RE-EXCISION OF BREAST LUMPECTOMY Right 05/25/2018   Procedure: RE-EXCISION OF RIGHT BREAST LUMPECTOMY;  Surgeon: Almond Lint, MD;  Location: [facility];  Service: General;  Laterality: Right;   seba     TUBAL LIGATION      Family History  Problem Relation Age of Onset   Breast cancer Mother    Hypertension Mother    Diabetes Mother        type 2    Other Mother        esophagus surgery   Heart disease Father    Hyperlipidemia Father    Hypertension Father    Heart attack Father        MI at age 20   Diabetes Brother        type 2   Heart disease Maternal Grandmother    Heart attack Maternal Grandfather    Hypertension Maternal Grandfather    Stroke Paternal Grandmother    Hypertension Sister    Diabetes Sister    Hypertension Brother    Alcohol abuse Maternal Aunt    Cancer Maternal Aunt    Colon cancer Neg Hx    Stomach cancer Neg Hx    Esophageal cancer Neg Hx     Social History   Socioeconomic History   Marital status: Married    Spouse name: Financial trader   Number of children: 2   Years of education: Not on file   Highest education level: GED or equivalent  Occupational History   Occupation: Insurance risk surveyor: [employer]  Tobacco Use   Smoking status: Never   Smokeless tobacco: Never  Substance and Sexual Activity   Alcohol use: No    Alcohol/week: 0.0 standard drinks of alcohol   Drug use: No   Sexual activity: Yes    Birth control/protection: Post-menopausal    Comment: lives with husband, no dietary restrictions just watching carbs, wears seat belt  Other Topics Concern   Not on file  Social History Narrative   Not on file   Social Determinants of Health   Financial Resource Strain: Low Risk  (11/08/2022)   Overall Financial Resource Strain (CARDIA)    Difficulty of Paying Living Expenses: Not hard at all  Food Insecurity: No Food Insecurity (11/08/2022)   Hunger Vital Sign    Worried About Running Out of Food in the Last Year: Never true    Ran Out of Food in the Last Year: Never true  Transportation Needs: No Transportation Needs (11/08/2022)   PRAPARE - Administrator, Civil Service (Medical): No    Lack of Transportation (Non-Medical): No  Physical Activity: Inactive (11/08/2022)   Exercise Vital Sign    Days of Exercise per Week: 0 days    Minutes of Exercise per Session: 0  min  Stress: No Stress Concern Present (11/08/2022)   [employer] of Occupational Health - Occupational Stress Questionnaire    Feeling of Stress : Only a little  Social Connections: Socially Integrated (11/08/2022)   Social Connection and Isolation Panel [NHANES]    Frequency of Communication with Friends and Family: More than three times a week    Frequency of Social Gatherings with Friends and Family: Three times a week  Attends Religious Services: More than 4 times per year    Active Member of Clubs or Organizations: Yes    Attends Banker Meetings: More than 4 times per year    Marital Status: Married  Catering manager Violence: Not At Risk (10/15/2022)   Humiliation, Afraid, Rape, and Kick questionnaire    Fear of Current or Ex-Partner: No    Emotionally Abused: No    Physically Abused: No    Sexually Abused: No    Outpatient Medications Prior to Visit  Medication Sig Dispense Refill   albuterol (VENTOLIN HFA) 108 (90 Base) MCG/ACT inhaler Inhale 2 puffs by mouth into the lungs every 6 (six) hours as needed for wheezing or shortness of breath. 6.7 g 0   ASPIR-LOW 81 MG EC tablet TAKE 1 TABLET BY MOUTH DAILY 30 tablet 3   bisoprolol-hydrochlorothiazide (ZIAC) 5-6.25 MG tablet Take 1 tablet by mouth daily. 90 tablet 1   Continuous Glucose Sensor (FREESTYLE LIBRE 3 SENSOR) MISC Inject 1 sensor into the skin every 14 (fourteen) days. Use to check blood glucose continuously. 2 each 5   fexofenadine (SM FEXOFENADINE HCL) 180 MG tablet Take 1 tablet (180 mg total) by mouth daily. 30 tablet 1   fluticasone (FLONASE) 50 MCG/ACT nasal spray Place 2 sprays into both nostrils daily. 16 g 5   glucose blood (ONETOUCH VERIO) test strip Use as directed to test blood sugar twice daily 100 each 2   Magnesium Oxide 400 MG CAPS Take 1 capsule (400 mg total) by mouth daily. 90 capsule 3   MELATONIN PO Take 10 mg by mouth at bedtime.     metFORMIN (GLUCOPHAGE) 1000 MG tablet Take 1  tablet (1,000 mg total) by mouth 2 (two) times daily with a meal. 180 tablet 3   montelukast (SINGULAIR) 10 MG tablet Take 1 tablet (10 mg total) by mouth at bedtime as needed. 30 tablet 3   omeprazole (PRILOSEC) 40 MG capsule Take 1 capsule (40 mg total) by mouth 2 (two) times daily. 90 capsule 0   rosuvastatin (CRESTOR) 5 MG tablet Take 1 tablet (5 mg total) by mouth daily. 90 tablet 1   Semaglutide,0.25 or 0.5MG /DOS, 2 MG/3ML SOPN Inject 0.5 mg into the skin once a week. 9 mL 3   Facility-Administered Medications Prior to Visit  Medication Dose Route Frequency Provider Last Rate Last Admin   Chlorhexidine Gluconate Cloth 2 % PADS 6 each  6 each Topical Once Almond Lint, MD       And   Chlorhexidine Gluconate Cloth 2 % PADS 6 each  6 each Topical Once Almond Lint, MD        Allergies  Allergen Reactions   Pravastatin Other (See Comments)    Myalgia, fatigue, memory loss   Amoxicillin Itching    REACTION: rash   Atorvastatin Other (See Comments)    Myalgia, fatigue, memory loss   Codeine Nausea And Vomiting    Review of Systems  Constitutional:  Negative for fever and malaise/fatigue.  HENT:  Negative for congestion.   Eyes:  Negative for blurred vision.  Respiratory:  Negative for shortness of breath.   Cardiovascular:  Negative for chest pain, palpitations and leg swelling.  Gastrointestinal:  Positive for abdominal pain. Negative for blood in stool and nausea.  Genitourinary:  Negative for dysuria and frequency.  Musculoskeletal:  Negative for falls.  Skin:  Negative for rash.  Neurological:  Negative for dizziness, loss of consciousness and headaches.  Endo/Heme/Allergies:  Negative for environmental  allergies.  Psychiatric/Behavioral:  Negative for depression. The patient is not nervous/anxious.        Objective:    Physical Exam Constitutional:      General: She is not in acute distress.    Appearance: Normal appearance. She is well-developed. She is not  toxic-appearing.  HENT:     Head: Normocephalic and atraumatic.     Right Ear: External ear normal.     Left Ear: External ear normal.     Nose: Nose normal.  Eyes:     General:        Right eye: No discharge.        Left eye: No discharge.     Conjunctiva/sclera: Conjunctivae normal.  Neck:     Thyroid: No thyromegaly.  Cardiovascular:     Rate and Rhythm: Normal rate and regular rhythm.     Heart sounds: Normal heart sounds. No murmur heard. Pulmonary:     Effort: Pulmonary effort is normal. No respiratory distress.     Breath sounds: Normal breath sounds.  Abdominal:     General: Bowel sounds are normal.     Palpations: Abdomen is soft.     Tenderness: There is no abdominal tenderness. There is no guarding.  Musculoskeletal:        General: Normal range of motion.     Cervical back: Neck supple.  Lymphadenopathy:     Cervical: No cervical adenopathy.  Skin:    General: Skin is warm and dry.  Neurological:     Mental Status: She is alert and oriented to person, place, and time.  Psychiatric:        Mood and Affect: Mood normal.        Behavior: Behavior normal.        Thought Content: Thought content normal.        Judgment: Judgment normal.     BP 126/74 (BP Location: Left Arm, Patient Position: Sitting, Cuff Size: Normal)   Pulse 63   Temp 98 F (36.7 C) (Oral)   Resp 16   Ht 5\' 2"  (1.575 m)   Wt 183 lb (83 kg)   SpO2 96%   BMI 33.47 kg/m  Wt Readings from Last 3 Encounters:  02/15/23 183 lb (83 kg)  01/10/23 182 lb 3.2 oz (82.6 kg)  11/15/22 182 lb 3.2 oz (82.6 kg)    Diabetic Foot Exam - Simple   No data filed    Lab Results  Component Value Date   WBC 5.1 02/15/2023   HGB 12.1 02/15/2023   HCT 37.5 02/15/2023   PLT 245.0 02/15/2023   GLUCOSE 105 (H) 02/15/2023   CHOL 120 02/15/2023   TRIG 111.0 02/15/2023   HDL 38.60 (L) 02/15/2023   LDLDIRECT 96.0 08/19/2020   LDLCALC 59 02/15/2023   ALT 26 02/15/2023   AST 20 02/15/2023   NA 140  02/15/2023   K 4.2 02/15/2023   CL 102 02/15/2023   CREATININE 0.95 02/15/2023   BUN 12 02/15/2023   CO2 28 02/15/2023   TSH 1.07 02/15/2023   HGBA1C 7.0 (H) 02/15/2023   MICROALBUR <0.7 02/15/2023    Lab Results  Component Value Date   TSH 1.07 02/15/2023   Lab Results  Component Value Date   WBC 5.1 02/15/2023   HGB 12.1 02/15/2023   HCT 37.5 02/15/2023   MCV 77.6 (L) 02/15/2023   PLT 245.0 02/15/2023   Lab Results  Component Value Date   NA 140 02/15/2023   K 4.2  02/15/2023   CO2 28 02/15/2023   GLUCOSE 105 (H) 02/15/2023   BUN 12 02/15/2023   CREATININE 0.95 02/15/2023   BILITOT 0.4 02/15/2023   ALKPHOS 76 02/15/2023   AST 20 02/15/2023   ALT 26 02/15/2023   PROT 6.5 02/15/2023   ALBUMIN 3.8 02/15/2023   CALCIUM 9.5 02/15/2023   ANIONGAP 11 01/15/2021   EGFR 60 05/29/2021   GFR 60.73 02/15/2023   Lab Results  Component Value Date   CHOL 120 02/15/2023   Lab Results  Component Value Date   HDL 38.60 (L) 02/15/2023   Lab Results  Component Value Date   LDLCALC 59 02/15/2023   Lab Results  Component Value Date   TRIG 111.0 02/15/2023   Lab Results  Component Value Date   CHOLHDL 3 02/15/2023   Lab Results  Component Value Date   HGBA1C 7.0 (H) 02/15/2023       Assessment & Plan:  Diabetes mellitus without complication (HCC) Assessment & Plan: hgba1c acceptable, minimize simple carbs. Increase exercise as tolerated. Continue current meds  Orders: -     Hemoglobin A1c -     Microalbumin / creatinine urine ratio  Essential hypertension Assessment & Plan: Well controlled, no changes to meds. Encouraged heart healthy diet such as the DASH diet and exercise as tolerated.   Orders: -     Comprehensive metabolic panel -     CBC with Differential/Platelet -     TSH  HOCM (hypertrophic obstructive cardiomyopathy) (HCC) Assessment & Plan: Basal septal hypertrophy 16 mm with increased ECV at 30% on cardiac MRI 06/2021 consistent with HOCM.  Doing well following with cardiology   Hyperlipidemia, mixed Assessment & Plan: Tolerating statin, encouraged heart healthy diet, avoid trans fats, minimize simple carbs and saturated fats. Increase exercise as tolerated  Orders: -     Lipid panel  Insomnia, unspecified type Assessment & Plan: Encouraged good sleep hygiene such as dark, quiet room. No blue/green glowing lights such as computer screens in bedroom. No alcohol or stimulants in evening. Cut down on caffeine as able. Regular exercise is helpful but not just prior to bed time.    Type 2 diabetes mellitus with hyperglycemia, without long-term current use of insulin (HCC) Assessment & Plan: hgba1c acceptable, minimize simple carbs. Increase exercise as tolerated. Continue current meds  Orders: -     AMB Referral to [facility]ordinaton ([facility] Patients) -     C-peptide  Abdominal pain, unspecified abdominal location -     Hyoscyamine Sulfate; Place 1 tablet (0.125 mg total) under the tongue every 4 (four) hours as needed.  Dispense: 30 tablet; Refill: 1 -     Amylase  Elevated lipase -     Lipase -     Amylase    Assessment and Plan    Abdominal Pain Nightly abdominal pain, described as a squeezing sensation, primarily in the lower abdomen and under the rib cage. Pain typically begins around 3 AM after waking to use the bathroom and persists, preventing sleep. Pain is less severe during the day. Discussed the use of hyoscyamine, a smooth muscle relaxer, to alleviate the pain. -Trial of hyoscyamine, to be taken sublingually at the onset of pain. -If hyoscyamine is ineffective, consider alternative medication Greggory Keen) or repeat imaging.  Ozempic Side Effects Patient reports onset of abdominal pain after starting Ozempic. Discussed the potential for gastrointestinal side effects with Ozempic and the influence of diet on these side effects. -Encouraged patient to maintain a diet of  lean proteins, complex  carbohydrates, and vegetables to potentially reduce side effects.  General Health Maintenance Discussed the importance of hydration, particularly upon waking, to prevent feelings of pressure and dehydration. Also discussed the importance of regular protein intake to maintain blood sugar levels. Reviewed patient's immunization status and recommended annual flu and COVID-19 boosters, as well as the shingles vaccine. -Encouraged patient to hydrate with 10 ounces of fluid every 1-2 hours and consume protein every 4 hours. -Recommended flu shot today or later in the month. -Recommended COVID-19 booster in the fall. -Recommended shingles vaccine, to be obtained at the pharmacy for cost-effectiveness. -Order lab work to monitor thyroid function, anemia, and lipase levels.  Nasal Congestion Patient reports pressure in the nasal area, particularly in the morning. Currently using Zyrtec and Flonase for symptom management. -Continue current regimen of Zyrtec and Flonase.  Follow-up Plan to follow up in 4-6 months unless hyoscyamine is ineffective in managing abdominal pain. If ineffective, consider alternative medication or repeat imaging.         Danise Edge, MD

## 2023-02-17 ENCOUNTER — Other Ambulatory Visit (HOSPITAL_BASED_OUTPATIENT_CLINIC_OR_DEPARTMENT_OTHER): Payer: Self-pay

## 2023-02-21 ENCOUNTER — Other Ambulatory Visit: Payer: Self-pay

## 2023-02-21 ENCOUNTER — Ambulatory Visit: Payer: PPO | Admitting: Pharmacist

## 2023-02-21 ENCOUNTER — Other Ambulatory Visit (HOSPITAL_BASED_OUTPATIENT_CLINIC_OR_DEPARTMENT_OTHER): Payer: Self-pay

## 2023-02-21 ENCOUNTER — Telehealth: Payer: Self-pay | Admitting: Physician Assistant

## 2023-02-21 DIAGNOSIS — Z79899 Other long term (current) drug therapy: Secondary | ICD-10-CM

## 2023-02-21 MED ORDER — FREESTYLE LIBRE 3 READER DEVI
0 refills | Status: AC
  Filled 2023-02-21: qty 1, 30d supply, fill #0

## 2023-02-21 MED ORDER — OMEPRAZOLE 40 MG PO CPDR
40.0000 mg | DELAYED_RELEASE_CAPSULE | Freq: Two times a day (BID) | ORAL | 0 refills | Status: DC
Start: 2023-02-21 — End: 2023-05-16
  Filled 2023-02-21: qty 180, 90d supply, fill #0

## 2023-02-21 NOTE — Telephone Encounter (Signed)
Inbound call from patient requesting a refill for omeprazole medication. Patient has been scheduled for the next available appointment 12/2. Please advise, thank you.

## 2023-02-21 NOTE — Telephone Encounter (Signed)
Rx for omeprazole sent to pharmacy with 90 day supply with 0 refills, which should get the patient through until the appointment.

## 2023-02-21 NOTE — Progress Notes (Signed)
02/21/2023 Name: TANAIRI GENO MRN: [id number] DOB: [birth year]  Chief Complaint  Patient presents with   Diabetes   Medication Management    RACHANA REBSTOCK is a 72 y.o. year old female who presented for a telephone visit.   They were referred to the pharmacist by their PCP for assistance in managing diabetes, hyperlipidemia, and medication access.    Subjective:  Care Team: Primary Care Provider: Bradd Canary, MD ; Next Scheduled Visit: 06/28/2023 Cardiologist: Dr Lonzo Cloud; Next Scheduled Visit: 07/12/2022  Medication Access/Adherence  Current Pharmacy:  [facility] [place] [facility] [address] [place] [address] Phone: [phone] Fax: [phone]  [facility] [address] [place] [address] Phone: [phone] Fax: [phone]   Patient reports affordability concerns with their medications: Yes  Patient reports access/transportation concerns to their pharmacy: No  Patient reports adherence concerns with their medications:  Yes  - due to cost   Diabetes: No Ozempic for 2 weeks due to cost. Last was $282 / month since in the coverage gap.   Current medications: metformin 1000mg  twice a day, Ozempic 0.5mg  weekly  Medications tried in the past: glipizide - changed to repaglinide, Januvia, Invokana - stopped due to cost, Rybelsus - started 07/2020 - but stopped at end of 2022 due to inability to qualify for patient assistance program and possibly headaches. Repaglinide stopped 12/2022 due to hypoglycemia.  Current glucose readings: previously when she saw endo 78 to 185 but today she reports blood glucose has been higher 130 to 200 since she has not had Ozempic in 2 weeks.  Using One Touch glucometer and also uses Continuous Glucose System - Libre 3  .  With [place] 3 she is using cell phone // [name] 3 app. However patient states that blood glucose is not the same on Libre  3 app and fingerstick blood glucose.  When questioned further it appears that blood glucose can be about 10% different.   Patient denies hypoglycemic s/sx including no dizziness, shakiness, sweating. Patient denies hyperglycemic symptoms including no polyuria, polydipsia, polyphagia, nocturia, neuropathy, blurred vision.  Current meal patterns:  Usually eats 2 meals per day - breakfast and evening Has small meal / snack in middle of day.   Hyperlipidemia/ASCVD Risk Reduction  Current lipid lowering medications: rosuvastatin  Antiplatelet regimen: aspirin 81mg  daily     Objective:  Lab Results  Component Value Date   HGBA1C 7.0 (H) 02/15/2023    Lab Results  Component Value Date   CREATININE 0.95 02/15/2023   BUN 12 02/15/2023   NA 140 02/15/2023   K 4.2 02/15/2023   CL 102 02/15/2023   CO2 28 02/15/2023    Lab Results  Component Value Date   CHOL 120 02/15/2023   HDL 38.60 (L) 02/15/2023   LDLCALC 59 02/15/2023   LDLDIRECT 96.0 08/19/2020   TRIG 111.0 02/15/2023   CHOLHDL 3 02/15/2023    Medications Reviewed Today   Medications were not reviewed in this encounter       Assessment/Plan:   Diabetes: Last A1c was at goal but home blood glucose has increased since she has not been able to afford Ozempic.  - Reviewed goal A1c, goal fasting, and goal 2 hour post prandial glucose - Emailed patient a replacement application for [employer] patient assistance program. Will also forward request to Med Assist Team since they might be able to apply on-line and get a quicker response.  -  Recommend to continue metformin 1000mg  twice a day.  - Provided patient with Ozempic sample - 4 doses. She will pick up at our office in the next few days. .  - Will send in Bud 3 reader at patient request. She can use either the reader or app on phone but explained that they cannot be used at the same time / with the same sensor. Also explained that fingerstick and interstitial  Continuous Glucose Monitor readings may differ but should not be significant since she does not dose medication based on results. Provided education about difference in fingerstick blood glucose and Continuous Glucose Monitor, when to check by fingerstick (magnifying glass with blood drop). Also discussed that when blood glucose is changing quickly, blood and interstitial blood glucose will differ more.    Hyperlipidemia/ASCVD Risk Reduction: LDL at goal of < 70 - Recommend to continue rosuvastatin    Medication management:  Reviewed med list with patient and updated Reviewed refill history - patient is due to refill omeprazole but looks like last refill from GI requested follow up appointment. Provided phone number for GI office for patient to make appt.   Meds ordered this encounter  Medications   Continuous Glucose Receiver (FREESTYLE LIBRE 3 READER) DEVI    Sig: Use to check blood glucose continuously    Dispense:  1 each    Refill:  0    Follow Up Plan: 2 to 4 weeks to check on patient assistance program and follow up BG  Henrene Pastor, PharmD Clinical Pharmacist [facility]

## 2023-02-22 ENCOUNTER — Telehealth: Payer: Self-pay | Admitting: Family Medicine

## 2023-02-22 NOTE — Telephone Encounter (Signed)
Pt wanted to know where she needs to fax the form to. Please call back to advise.

## 2023-02-22 NOTE — Telephone Encounter (Signed)
Patient called and provided practice faxed number [phone] - make sure to fax to attn: Nasario Czerniak. She also said she might just bring to office since she has to pick up sample anyway.

## 2023-02-23 ENCOUNTER — Telehealth: Payer: Self-pay

## 2023-02-23 NOTE — Telephone Encounter (Signed)
PAP: Application for Ozempic has been submitted to PAP Companies: [employer], online  PLEASE BE ADVISED I E-FILED APPLICATION

## 2023-02-23 NOTE — Telephone Encounter (Signed)
-----   Message from Henrene Pastor sent at 02/21/2023  9:57 AM EDT ----- I have sent patient email with [employer]. If you are possibly able to do quicker online will you contact patient. Also please check in with her regarding completing app if you are not able to do online.

## 2023-02-24 NOTE — Telephone Encounter (Signed)
[name] with [employer] called to confirm the claims address on the back of the insurance card and the Rx BIN, PCN and GRP # because it was illegible on the app. Confirmed insurance off the card. [name] will update the information on their end so that they can submit pt's diabetes supplies to insurance.

## 2023-03-01 NOTE — Telephone Encounter (Signed)
PAP: Patient assistance application for Ozempic has been approved by PAP Companies: [employer] from 02/28/2023 to 02/20/2024. Medication should be delivered to PAP Delivery: Provider's office For further shipping updates, please contact [employer] at [phone] Pt ID is: [id number]  PLEASE BE ADVISED

## 2023-03-09 ENCOUNTER — Telehealth: Payer: Self-pay

## 2023-03-09 NOTE — Telephone Encounter (Signed)
Called pt to inform her that we have received her Ozempic and it will be here ready for pick up at pts convenience, pt is aware and expressed understanding.

## 2023-03-10 ENCOUNTER — Other Ambulatory Visit: Payer: Self-pay | Admitting: Family Medicine

## 2023-03-11 ENCOUNTER — Other Ambulatory Visit: Payer: Self-pay

## 2023-03-11 ENCOUNTER — Other Ambulatory Visit (HOSPITAL_BASED_OUTPATIENT_CLINIC_OR_DEPARTMENT_OTHER): Payer: Self-pay

## 2023-03-11 MED ORDER — ROSUVASTATIN CALCIUM 5 MG PO TABS
5.0000 mg | ORAL_TABLET | Freq: Every day | ORAL | 1 refills | Status: DC
  Filled 2023-03-11: qty 90, 90d supply, fill #0
  Filled 2023-06-07: qty 90, 90d supply, fill #1

## 2023-03-21 ENCOUNTER — Other Ambulatory Visit: Payer: Self-pay | Admitting: Family Medicine

## 2023-03-22 ENCOUNTER — Other Ambulatory Visit: Payer: Self-pay

## 2023-03-22 ENCOUNTER — Other Ambulatory Visit (HOSPITAL_BASED_OUTPATIENT_CLINIC_OR_DEPARTMENT_OTHER): Payer: Self-pay

## 2023-03-22 MED ORDER — BISOPROLOL-HYDROCHLOROTHIAZIDE 5-6.25 MG PO TABS
1.0000 | ORAL_TABLET | Freq: Every day | ORAL | 1 refills | Status: DC
  Filled 2023-03-22: qty 90, 90d supply, fill #0
  Filled 2023-06-20: qty 90, 90d supply, fill #1

## 2023-03-26 ENCOUNTER — Other Ambulatory Visit (HOSPITAL_BASED_OUTPATIENT_CLINIC_OR_DEPARTMENT_OTHER): Payer: Self-pay

## 2023-03-26 ENCOUNTER — Emergency Department (HOSPITAL_BASED_OUTPATIENT_CLINIC_OR_DEPARTMENT_OTHER)
Admission: EM | Admit: 2023-03-26 | Discharge: 2023-03-26 | Disposition: A | Discharge status: Home / Self Care | Payer: PPO | Attending: Emergency Medicine | Admitting: Emergency Medicine

## 2023-03-26 ENCOUNTER — Emergency Department (HOSPITAL_BASED_OUTPATIENT_CLINIC_OR_DEPARTMENT_OTHER): Payer: PPO

## 2023-03-26 ENCOUNTER — Other Ambulatory Visit: Payer: Self-pay

## 2023-03-26 ENCOUNTER — Encounter (HOSPITAL_BASED_OUTPATIENT_CLINIC_OR_DEPARTMENT_OTHER): Payer: Self-pay | Admitting: Emergency Medicine

## 2023-03-26 DIAGNOSIS — Z794 Long term (current) use of insulin: Secondary | ICD-10-CM | POA: Insufficient documentation

## 2023-03-26 DIAGNOSIS — K219 Gastro-esophageal reflux disease without esophagitis: Secondary | ICD-10-CM | POA: Diagnosis not present

## 2023-03-26 DIAGNOSIS — Z7984 Long term (current) use of oral hypoglycemic drugs: Secondary | ICD-10-CM | POA: Insufficient documentation

## 2023-03-26 DIAGNOSIS — K5730 Diverticulosis of large intestine without perforation or abscess without bleeding: Secondary | ICD-10-CM | POA: Diagnosis not present

## 2023-03-26 DIAGNOSIS — R1084 Generalized abdominal pain: Secondary | ICD-10-CM | POA: Diagnosis not present

## 2023-03-26 DIAGNOSIS — Z79899 Other long term (current) drug therapy: Secondary | ICD-10-CM | POA: Diagnosis not present

## 2023-03-26 DIAGNOSIS — E119 Type 2 diabetes mellitus without complications: Secondary | ICD-10-CM | POA: Diagnosis not present

## 2023-03-26 DIAGNOSIS — I10 Essential (primary) hypertension: Secondary | ICD-10-CM | POA: Insufficient documentation

## 2023-03-26 DIAGNOSIS — R109 Unspecified abdominal pain: Secondary | ICD-10-CM | POA: Diagnosis not present

## 2023-03-26 DIAGNOSIS — K760 Fatty (change of) liver, not elsewhere classified: Secondary | ICD-10-CM | POA: Diagnosis not present

## 2023-03-26 LAB — CBC WITH DIFFERENTIAL/PLATELET
Abs Immature Granulocytes: 0.02 10*3/uL (ref 0.00–0.07)
Basophils Absolute: 0 10*3/uL (ref 0.0–0.1)
Basophils Relative: 1 %
Eosinophils Absolute: 0.1 10*3/uL (ref 0.0–0.5)
Eosinophils Relative: 1 %
HCT: 37.5 % (ref 36.0–46.0)
Hemoglobin: 12 g/dL (ref 12.0–15.0)
Immature Granulocytes: 0 %
Lymphocytes Relative: 36 %
Lymphs Abs: 1.8 10*3/uL (ref 0.7–4.0)
MCH: 25 pg — ABNORMAL LOW (ref 26.0–34.0)
MCHC: 32 g/dL (ref 30.0–36.0)
MCV: 78.1 fL — ABNORMAL LOW (ref 80.0–100.0)
Monocytes Absolute: 0.4 10*3/uL (ref 0.1–1.0)
Monocytes Relative: 8 %
Neutro Abs: 2.7 10*3/uL (ref 1.7–7.7)
Neutrophils Relative %: 54 %
Platelets: 240 10*3/uL (ref 150–400)
RBC: 4.8 MIL/uL (ref 3.87–5.11)
RDW: 14.1 % (ref 11.5–15.5)
WBC: 4.9 10*3/uL (ref 4.0–10.5)
nRBC: 0 % (ref 0.0–0.2)

## 2023-03-26 LAB — COMPREHENSIVE METABOLIC PANEL
ALT: 24 U/L (ref 0–44)
AST: 17 U/L (ref 15–41)
Albumin: 3.8 g/dL (ref 3.5–5.0)
Alkaline Phosphatase: 60 U/L (ref 38–126)
Anion gap: 9 (ref 5–15)
BUN: 15 mg/dL (ref 8–23)
CO2: 27 mmol/L (ref 22–32)
Calcium: 9.3 mg/dL (ref 8.9–10.3)
Chloride: 103 mmol/L (ref 98–111)
Creatinine, Ser: 0.9 mg/dL (ref 0.44–1.00)
GFR, Estimated: >60 mL/min (ref >60)
Glucose, Bld: 105 mg/dL — ABNORMAL HIGH (ref 70–99)
Potassium: 4 mmol/L (ref 3.5–5.1)
Sodium: 139 mmol/L (ref 135–145)
Total Bilirubin: 0.3 mg/dL (ref 0.3–1.2)
Total Protein: 6.4 g/dL — ABNORMAL LOW (ref 6.5–8.1)

## 2023-03-26 LAB — URINALYSIS, ROUTINE W REFLEX MICROSCOPIC
Bilirubin Urine: NEGATIVE
Glucose, UA: NEGATIVE mg/dL
Hgb urine dipstick: NEGATIVE
Ketones, ur: NEGATIVE mg/dL
Leukocytes,Ua: NEGATIVE
Nitrite: NEGATIVE
Protein, ur: NEGATIVE mg/dL
Specific Gravity, Urine: >1.046 — ABNORMAL HIGH (ref 1.005–1.030)
pH: 5 (ref 5.0–8.0)

## 2023-03-26 LAB — LIPASE, BLOOD: Lipase: 81 U/L — ABNORMAL HIGH (ref 11–51)

## 2023-03-26 MED ORDER — FAMOTIDINE 20 MG PO TABS
20.0000 mg | ORAL_TABLET | Freq: Every day | ORAL | 0 refills | Status: DC
  Filled 2023-03-26: qty 30, 30d supply, fill #0

## 2023-03-26 MED ORDER — FAMOTIDINE 20 MG PO TABS
20.0000 mg | ORAL_TABLET | Freq: Once | ORAL | Status: AC
  Administered 2023-03-26: 20 mg via ORAL
  Filled 2023-03-26: qty 1

## 2023-03-26 MED ORDER — FAMOTIDINE IN NACL 20-0.9 MG/50ML-% IV SOLN: 20.0000 mg | Freq: Once | INTRAVENOUS | Status: DC

## 2023-03-26 MED ORDER — ACETAMINOPHEN 500 MG PO TABS
1000.0000 mg | ORAL_TABLET | Freq: Once | ORAL | Status: AC
  Administered 2023-03-26: 1000 mg via ORAL
  Filled 2023-03-26: qty 2

## 2023-03-26 MED ORDER — IOHEXOL 300 MG/ML  SOLN
100.0000 mL | Freq: Once | INTRAMUSCULAR | Status: AC | PRN
  Administered 2023-03-26: 100 mL via INTRAVENOUS

## 2023-03-26 NOTE — ED Notes (Signed)
Pt ambulated to bathroom without assistance. Unable to provide a urine sample at this time. Pt reports difficulty starting stream, only drops of urine coming out during attempt.

## 2023-03-26 NOTE — ED Notes (Signed)
Dc instructions reviewed with patient. Patient voiced understanding. Dc with belongings.  °

## 2023-03-26 NOTE — ED Triage Notes (Signed)
Diffuse abdominal pain,worse in epigastric. Pt describes pain as gnawing . This has been going on for months. When she lies down the pain is worse. She has been taking hycosamine from primary but limited help. She also reports dizziness ,says she feels like room is spinning. Has had severe vertigo,but this is different. Not nauseated.

## 2023-03-26 NOTE — Discharge Instructions (Signed)
Your CT scan and lab work today were reassuring other than slightly elevated lipase.  You are being started on a new medicine called Pepcid, you can try this at bedtime to see if it helps with your symptoms.  If you develop worsening pain, chest pain, fever, inability eat or drink or any other new concern terms you should return to the ED.  You should call your doctor on Monday to schedule follow-up.

## 2023-03-26 NOTE — ED Notes (Signed)
Transport to ct

## 2023-03-26 NOTE — ED Provider Notes (Signed)
[place] EMERGENCY DEPARTMENT AT [facility] Provider Note   CSN: [id number] Arrival date & time: 03/26/23  4098     History  Chief Complaint  Patient presents with   Abdominal Pain    [name] is a 72 y.o. female.  HPI 72 year old female history of type 2 diabetes, GERD, hypertension, hyperlipidemia presenting for abdominal pain.  She states she has had pain for many months if not years.  Pain is generalized, worse periumbilical.  It is usually worse at night.  She is here today because she has had persistent pain and wants to try to figure out what is going on.  Not acutely worse.  She is not any nausea or vomiting.  Regular bowel movements.  She reports poor appetite and worsening of pain since starting Ozempic at least 6 months ago.  She is followed with her doctor and has had a CT scan and gallbladder ultrasound which were unremarkable, no imaging for over 6 months.  She has tried hyoscyamine without any improvement.  She takes omeprazole daily.  She has had prior colonoscopy but no recent endoscopy.  No melena or hematochezia.  No fevers or chills or urinary symptoms.  No diarrhea.  No chest pain or shortness of breath.  She has had dizziness for many months as well.  Is usually worse with standing or movement.  Currently is asymptomatic.  No headaches or weakness or numbness.  No vision changes.     Home Medications Prior to Admission medications   Medication Sig Start Date End Date Taking? Authorizing Provider  famotidine (PEPCID) 20 MG tablet Take 1 tablet (20 mg total) by mouth at bedtime. 03/26/23  Yes Laurence Spates, MD  albuterol (VENTOLIN HFA) 108 (90 Base) MCG/ACT inhaler Inhale 2 puffs by mouth into the lungs every 6 (six) hours as needed for wheezing or shortness of breath. Patient not taking: Reported on 02/21/2023 07/21/22   Bradd Canary, MD  ASPIR-LOW 81 MG EC tablet TAKE 1 TABLET BY MOUTH DAILY 01/16/15   Bradd Canary, MD   bisoprolol-hydrochlorothiazide [facility]) 5-6.25 MG tablet Take 1 tablet by mouth daily. 03/22/23   Bradd Canary, MD  cetirizine (ZYRTEC) 10 MG tablet Take 10 mg by mouth daily as needed for allergies.    [provider]  Continuous Glucose Receiver (FREESTYLE LIBRE 3 READER) DEVI Use as directed to check blood glucose continuously. 02/21/23   Bradd Canary, MD  Continuous Glucose Sensor (FREESTYLE LIBRE 3 SENSOR) MISC Inject 1 sensor into the skin every 14 (fourteen) days. Use to check blood glucose continuously. 12/13/22   Bradd Canary, MD  fluticasone (FLONASE) 50 MCG/ACT nasal spray Place 2 sprays into both nostrils daily. 10/08/22   Bradd Canary, MD  glucose blood (ONETOUCH VERIO) test strip Use as directed to test blood sugar twice daily 01/12/23   Shamleffer, Konrad Dolores, MD  hyoscyamine (LEVSIN SL) 0.125 MG SL tablet Place 1 tablet (0.125 mg total) under the tongue every 4 (four) hours as needed. 02/15/23   Bradd Canary, MD  Magnesium Oxide 400 MG CAPS Take 1 capsule (400 mg total) by mouth daily. 06/04/21   Quintella Reichert, MD  metFORMIN (GLUCOPHAGE) 1000 MG tablet Take 1 tablet (1,000 mg total) by mouth 2 (two) times daily with a meal. 01/10/23 01/10/24  Shamleffer, Konrad Dolores, MD  omeprazole (PRILOSEC) 40 MG capsule Take 1 capsule (40 mg total) by mouth 2 (two) times daily. 02/21/23   Esterwood, Amy Kathie Rhodes,  PA-C  rosuvastatin (CRESTOR) 5 MG tablet Take 1 tablet (5 mg total) by mouth daily. 03/11/23   Bradd Canary, MD  Semaglutide,0.25 or 0.5MG /DOS, 2 MG/3ML SOPN Inject 0.5 mg into the skin once a week. Patient not taking: Reported on 02/21/2023 08/06/22   Shamleffer, Konrad Dolores, MD      Allergies    Pravastatin, Amoxicillin, Atorvastatin, and Codeine    Review of Systems   Review of Systems Review of systems completed and notable as per HPI.  ROS otherwise negative.   Physical Exam Updated Vital Signs BP 114/71   Pulse 71   Temp 97.8 F (36.6 C)   Resp 20   Wt  81.6 kg   SpO2 98%   BMI 32.92 kg/m  Physical Exam Vitals and nursing note reviewed.  Constitutional:      General: She is not in acute distress.    Appearance: She is well-developed.  HENT:     Head: Normocephalic and atraumatic.     Mouth/Throat:     Mouth: Mucous membranes are moist.     Pharynx: Oropharynx is clear.  Eyes:     Extraocular Movements: Extraocular movements intact.     Conjunctiva/sclera: Conjunctivae normal.     Pupils: Pupils are equal, round, and reactive to light.  Cardiovascular:     Rate and Rhythm: Normal rate and regular rhythm.     Pulses: Normal pulses.     Heart sounds: Normal heart sounds. No murmur heard. Pulmonary:     Effort: Pulmonary effort is normal. No respiratory distress.     Breath sounds: Normal breath sounds.  Abdominal:     Palpations: Abdomen is soft.     Tenderness: There is abdominal tenderness. There is no guarding or rebound.     Comments: Mild periumbilical tenderness  Musculoskeletal:        General: No swelling.     Cervical back: Neck supple.  Skin:    General: Skin is warm and dry.     Capillary Refill: Capillary refill takes less than 2 seconds.  Neurological:     Mental Status: She is alert.  Psychiatric:        Mood and Affect: Mood normal.     ED Results / Procedures / Treatments   Labs (all labs ordered are listed, but only abnormal results are displayed) Labs Reviewed  COMPREHENSIVE METABOLIC PANEL - Abnormal; Notable for the following components:      Result Value   Glucose, Bld 105 (*)    Total Protein 6.4 (*)    All other components within normal limits  LIPASE, BLOOD - Abnormal; Notable for the following components:   Lipase 81 (*)    All other components within normal limits  CBC WITH DIFFERENTIAL/PLATELET - Abnormal; Notable for the following components:   MCV 78.1 (*)    MCH 25.0 (*)    All other components within normal limits  URINALYSIS, ROUTINE W REFLEX MICROSCOPIC - Abnormal; Notable for  the following components:   Color, Urine COLORLESS (*)    Specific Gravity, Urine >1.046 (*)    All other components within normal limits    EKG EKG Interpretation Date/Time:  Saturday March 26 2023 08:09:35 EDT Ventricular Rate:  77 PR Interval:  177 QRS Duration:  142 QT Interval:  436 QTC Calculation: 494 R Axis:   -81  Text Interpretation: Sinus rhythm RBBB and LAFB No significant change since last tracing Confirmed by Fulton Reek [phone]) on 03/26/2023 8:27:36 AM  Radiology CT  ABDOMEN PELVIS W CONTRAST  Result Date: 03/26/2023 CLINICAL DATA:  Abdominal pain, acute, nonlocalized. EXAM: CT ABDOMEN AND PELVIS WITH CONTRAST TECHNIQUE: Multidetector CT imaging of the abdomen and pelvis was performed using the standard protocol following bolus administration of intravenous contrast. RADIATION DOSE REDUCTION: This exam was performed according to the departmental dose-optimization program which includes automated exposure control, adjustment of the mA and/or kV according to patient size and/or use of iterative reconstruction technique. CONTRAST:  OMNIPAQUE IOHEXOL 300 MG/ML  SOLN COMPARISON:  CT of the abdomen pelvis 02/04/2021 FINDINGS: Lower chest: The lung bases are clear without focal nodule, mass, or airspace disease. Hepatobiliary: Diffuse fatty infiltration of the liver is again noted. The common bile duct and gallbladder are normal. No discrete hepatic lesions are present. Pancreas: Unremarkable. No pancreatic ductal dilatation or surrounding inflammatory changes. Spleen: Normal in size without focal abnormality. Adrenals/Urinary Tract: The adrenal glands are normal bilaterally. Kidneys are unremarkable. No stone or mass lesion is present. No obstruction is present. The ureters are within normal limits. The bladder is normal. Stomach/Bowel: The stomach and duodenum are within normal limits. Multiple small bowel loops oppose the anterior peritoneum suggesting adhesions. No  obstruction is present. The terminal ileum is normal. The appendix is visualized and normal. The ascending and transverse colon are within normal limits. Diverticular changes are present in the sigmoid colon without focal inflammation to suggest diverticulitis. Vascular/Lymphatic: Atherosclerotic calcifications are present in the aorta and branch vessels. No aneurysm is present. Reproductive: Status post hysterectomy. No adnexal masses. Other: A wide-based paraumbilical hernia is present. No other significant ventral hernias are present. No free fluid or free air is present. Musculoskeletal: Degenerative changes are present at L5-S1. Thoracolumbar spine is otherwise unremarkable. No focal osseous lesions are present. The bony pelvis is normal. The hips are located and within normal limits. IMPRESSION: 1. No acute or focal lesion to explain the patient's symptoms. 2. Hepatic steatosis. 3. Sigmoid diverticulosis without diverticulitis. 4.  Aortic Atherosclerosis (ICD10-I70.0). Electronically Signed   By: Marin Roberts M.D.   On: 03/26/2023 10:30    Procedures Procedures    Medications Ordered in ED Medications  acetaminophen (TYLENOL) tablet 1,000 mg (1,000 mg Oral Given 03/26/23 0814)  famotidine (PEPCID) tablet 20 mg (20 mg Oral Given 03/26/23 0815)  iohexol (OMNIPAQUE) 300 MG/ML solution 100 mL (100 mLs Intravenous Contrast Given 03/26/23 8119)    ED Course/ Medical Decision Making/ A&P                                  Medical Decision Making Amount and/or Complexity of Data Reviewed Labs: ordered. Radiology: ordered.  Risk OTC drugs. Prescription drug management.   Medical Decision Making:   [name] is a 72 y.o. female who presented to the ED today with several months of abdominal pain.  Vital signs reviewed.  On exam she is well-appearing, mild.  Local tenderness.  Status post appendectomy.  Pain is ongoing and chronic at this point but unclear what is causing it.   Differential including side effect from Ozempic, gastritis, GERD, pancreatitis.  Will evaluate for bili pathology as well though history seems less consistent with this and she is already had an ultrasound in 23 that did not show any stones.   Patient placed on continuous vitals and telemetry monitoring while in ED which was reviewed periodically.  Reviewed and confirmed nursing documentation for past medical history, family history, social history.  Reassessment and Plan:   On reassessment pain improved and tolerating p.o.  She feels like the Pepcid helped.  CT scan is reassuring.  Labs overall reassuring other than mildly elevated lipase.  Recommend she follow closely with her PCP and GI for elevated lipase.  Suspect could be component of gastritis although she is on omeprazole.  Will trial adding Pepcid at home.  Recommend she call her doctor Monday.  Strict return precautions given.  Discharged in stable condition.    Patient's presentation is most consistent with acute complicated illness / injury requiring diagnostic workup.           Final Clinical Impression(s) / ED Diagnoses Final diagnoses:  Generalized abdominal pain    Rx / DC Orders ED Discharge Orders          Ordered    famotidine (PEPCID) 20 MG tablet  Daily at bedtime        03/26/23 1149              Laurence Spates, MD 03/26/23 1621

## 2023-03-30 ENCOUNTER — Encounter: Payer: Self-pay | Admitting: Family Medicine

## 2023-03-30 ENCOUNTER — Ambulatory Visit (INDEPENDENT_AMBULATORY_CARE_PROVIDER_SITE_OTHER): Payer: PPO | Admitting: Family Medicine

## 2023-03-30 VITALS — BP 123/72 | HR 81 | Temp 98.1°F | Resp 16 | Ht 62.0 in | Wt 185.0 lb

## 2023-03-30 DIAGNOSIS — R1013 Epigastric pain: Secondary | ICD-10-CM | POA: Diagnosis not present

## 2023-03-30 NOTE — Progress Notes (Signed)
Acute Office Visit  Subjective:     Patient ID: [name], female    DOB: 07/20/1950, 72 y.o.   MRN: [id number]  Chief Complaint  Patient presents with   Follow-up    Hospital follow up    HPI Patient is in today for hospital follow-up.   Discussed the use of AI scribe software for clinical note transcription with the patient, who gave verbal consent to proceed.  History of Present Illness   The patient, with a history of diabetes managed with Ozempic, presented with chronic abdominal pain that has been ongoing for several months. The pain was severe enough to warrant a visit to the emergency department on the twelfth. The patient reported that the pain is located in the epigastric region and tends to radiate to the back and shoulders. The pain is described as a constant gnawing ache that is worse at night. The patient also reported experiencing spasms in the same area.  The patient has been taking Omeprazole twice daily, thirty minutes before meals, for an unspecified condition.   The patient's diet is not specific, but they reported trying to cut back on red meat and eating more vegetables. They have not noticed any correlation between their diet and the severity of their abdominal pain. The patient's bowel movements are reported to be normal and formed.          ROS All review of systems negative except what is listed in the HPI      Objective:    BP 123/72 (BP Location: Right Arm, Patient Position: Sitting, Cuff Size: Normal)   Pulse 81   Temp 98.1 F (36.7 C) (Oral)   Resp 16   Ht 5\' 2"  (1.575 m)   Wt 185 lb (83.9 kg)   SpO2 99%   BMI 33.84 kg/m    Physical Exam Vitals reviewed.  Constitutional:      Appearance: Normal appearance.  Cardiovascular:     Rate and Rhythm: Normal rate and regular rhythm.     Heart sounds: Normal heart sounds.  Pulmonary:     Effort: Pulmonary effort is normal.     Breath sounds: Normal breath sounds.  Abdominal:      General: Abdomen is flat. Bowel sounds are normal. There is no distension.     Palpations: Abdomen is soft. There is no mass.     Tenderness: There is no abdominal tenderness. There is no guarding or rebound.  Skin:    General: Skin is warm and dry.  Neurological:     Mental Status: She is alert and oriented to person, place, and time.  Psychiatric:        Mood and Affect: Mood normal.        Behavior: Behavior normal.        Thought Content: Thought content normal.        Judgment: Judgment normal.     No results found for any visits on 03/30/23.      Assessment & Plan:   Problem List Items Addressed This Visit       Active Problems   Abdominal pain - Primary   Relevant Orders   H. pylori breath test    Epigastric pain ongoing for months, possibly related to Ozempic. Multiple imaging studies have been unremarkable. Lipase mildly elevated, possibly due to Ozempic. Patient also reports a sensation of spasms in the epigastric region. -Order H. Pylori breath test. Patient to stop Omeprazole for two weeks prior to testing, if  tolerable. -If unable to tolerate stopping Omeprazole, advise patient to contact GI for an earlier appointment for evaluation of epigastric pain. -If GI workup is unremarkable, consider discussing with endocrinologist about switching Ozempic to another medication.        No orders of the defined types were placed in this encounter.   Return for lab for H pylori testing in 2 weeks if able to stop PPI, otherwise call GI office for sooner f/u.  Clayborne Dana, NP

## 2023-04-08 ENCOUNTER — Other Ambulatory Visit: Payer: Self-pay | Admitting: Family Medicine

## 2023-04-08 DIAGNOSIS — Z1231 Encounter for screening mammogram for malignant neoplasm of breast: Secondary | ICD-10-CM

## 2023-04-12 ENCOUNTER — Other Ambulatory Visit: Payer: PPO | Admitting: Pharmacist

## 2023-04-12 ENCOUNTER — Other Ambulatory Visit: Payer: Self-pay

## 2023-04-12 NOTE — Progress Notes (Signed)
04/12/2023 Name: [name] MRN: [id number] DOB: 01/21/1951  Chief Complaint  Patient presents with   Medication Management   Diabetes    [name] is a 72 y.o. year old female who presented for a telephone visit.   They were referred to the pharmacist by their PCP for assistance in managing diabetes, hyperlipidemia, and medication access.    Subjective:  Care Team: Primary Care Provider: Bradd Canary, MD ; Next Scheduled Visit: 06/28/2023 Cardiologist: Dr Lonzo Cloud; Next Scheduled Visit: 07/12/2022  Medication Access/Adherence  Current Pharmacy:  [facility] [place] [facility] [address] [place] [address] Phone: [phone] Fax: [phone]  [facility] [address] [place] [place] [address] Phone: [phone] Fax: [phone]  [facility] [place] [facility] [address] [place] [address] Phone: [phone] Fax: [phone]   Patient reports affordability concerns with their medications: Yes  - she has been approved 02/2023 to receive Ozempic thru 06/14/2023 from [employer] medication assistance program.  Patient reports access/transportation concerns to their pharmacy: No  Patient reports adherence concerns with their medications:  No     Diabetes:  Current medications: metformin 1000mg  twice a day, Ozempic 0.5mg  weekly  Medications tried in the past: glipizide - changed to repaglinide, Januvia, Invokana - stopped due to cost, Rybelsus - started 07/2020 - but stopped at end of 2022 due to inability to qualify for patient assistance program and possibly headaches. Repaglinide stopped 12/2022 due to hypoglycemia.  Current glucose readings: 100 to 175  Using One Touch glucometer and also uses Continuous Glucose System - Libre 3  With [place] 3 she is using cell phone // [name] 3 app.   Patient denies hypoglycemic s/sx  including no dizziness, shakiness, sweating. Patient denies hyperglycemic symptoms including no polyuria, polydipsia, polyphagia, nocturia, neuropathy, blurred vision. She has been having some abdominal pain. Currently being worked up. Patient does not think that this is related to Ozempic.   Current meal patterns:  Usually eats 2 meals per day - breakfast and evening Has small meal / snack in middle of day.   Hyperlipidemia/ASCVD Risk Reduction  Current lipid lowering medications: rosuvastatin  Antiplatelet regimen: aspirin 81mg  daily     Objective:  Lab Results  Component Value Date   HGBA1C 7.0 (H) 02/15/2023    Lab Results  Component Value Date   CREATININE 0.90 03/26/2023   BUN 15 03/26/2023   NA 139 03/26/2023   K 4.0 03/26/2023   CL 103 03/26/2023   CO2 27 03/26/2023    Lab Results  Component Value Date   CHOL 120 02/15/2023   HDL 38.60 (L) 02/15/2023   LDLCALC 59 02/15/2023   LDLDIRECT 96.0 08/19/2020   TRIG 111.0 02/15/2023   CHOLHDL 3 02/15/2023    Medications Reviewed Today     Reviewed by Henrene Pastor, RPH-CPP (Pharmacist) on 04/12/23 at 1213  Med List Status: <None>   Medication Order Taking? Sig Documenting Provider Last Dose Status Informant  albuterol (VENTOLIN HFA) 108 (90 Base) MCG/ACT inhaler [id number] Yes Inhale 2 puffs by mouth into the lungs every 6 (six) hours as needed for wheezing or shortness of breath. Bradd Canary, MD Taking Active   ASPIR-LOW 81 MG EC tablet [id number] Yes TAKE 1 TABLET BY MOUTH DAILY Bradd Canary, MD Taking Active   bisoprolol-hydrochlorothiazide [facility]) 5-6.25 MG tablet [id number] Yes Take 1 tablet by mouth daily. Bradd Canary, MD Taking Active  cetirizine (ZYRTEC) 10 MG tablet [id number] Yes Take 10 mg by mouth daily as needed for allergies. [provider] Taking Active   Continuous Glucose Receiver (FREESTYLE LIBRE 3 READER) DEVI [id number]  Use as directed to check blood glucose continuously. Bradd Canary, MD  Active   Continuous Glucose Sensor (FREESTYLE LIBRE 3 SENSOR) [place] [id number] Yes Inject 1 sensor into the skin every 14 (fourteen) days. Use to check blood glucose continuously. Bradd Canary, MD Taking Active   famotidine (PEPCID) 20 MG tablet [id number] Yes Take 1 tablet (20 mg total) by mouth at bedtime. Laurence Spates, MD Taking Active   fluticasone [facility]) 50 MCG/ACT nasal spray [id number] Yes Place 2 sprays into both nostrils daily. Bradd Canary, MD Taking Active   glucose blood [facility] VERIO) test strip [id number] Yes Use as directed to test blood sugar twice daily Shamleffer, Konrad Dolores, MD Taking Active   hyoscyamine (LEVSIN SL) 0.125 MG SL tablet [id number] Yes Place 1 tablet (0.125 mg total) under the tongue every 4 (four) hours as needed. Bradd Canary, MD Taking Active   Magnesium Oxide 400 MG CAPS [id number] Yes Take 1 capsule (400 mg total) by mouth daily. Quintella Reichert, MD Taking Active   metFORMIN (GLUCOPHAGE) 1000 MG tablet [id number] Yes Take 1 tablet (1,000 mg total) by mouth 2 (two) times daily with a meal. Shamleffer, Konrad Dolores, MD Taking Active   omeprazole (PRILOSEC) 40 MG capsule [id number]  Take 1 capsule (40 mg total) by mouth 2 (two) times daily. Esterwood, Amy S, PA-C  Active   rosuvastatin (CRESTOR) 5 MG tablet [id number] Yes Take 1 tablet (5 mg total) by mouth daily. Bradd Canary, MD Taking Active   Semaglutide,0.25 or 0.5MG /DOS, 2 MG/3ML Namon Cirri [id number] Yes Inject 0.5 mg into the skin once a week. Shamleffer, Konrad Dolores, MD Taking Active            Med Note [name], [place] B   Tue Apr 12, 2023 12:13 PM) Receiving from [employer] medication assistance program thru 06/14/2023              Assessment/Plan:   Diabetes: Last A1c was at goal   - Reviewed goal A1c, goal fasting, and goal 2 hour post prandial glucose  - Recommend to continue metformin 1000mg  twice a day.  - Continue Ozempic - approved for [employer] medication assistance  program thry 06/14/2023. We have 2025 application but will hold off on sending in for 2 more weeks in case abdominal pain workup does not show any other causes of abdominal pain - might need to hold Ozempic to see if it could be contributing to symptoms.  4 doses.     Hyperlipidemia/ASCVD Risk Reduction: LDL at goal of < 70 - Recommend to continue rosuvastatin    Medication management:  Reviewed med list with patient and updated Reviewed refill history   Follow Up Plan: 2 to 4 weeks  Henrene Pastor, PharmD Clinical Pharmacist [facility]

## 2023-04-13 ENCOUNTER — Other Ambulatory Visit: Payer: Self-pay

## 2023-04-13 ENCOUNTER — Other Ambulatory Visit (HOSPITAL_BASED_OUTPATIENT_CLINIC_OR_DEPARTMENT_OTHER): Payer: Self-pay

## 2023-04-13 ENCOUNTER — Other Ambulatory Visit: Payer: PPO

## 2023-04-13 DIAGNOSIS — R1013 Epigastric pain: Secondary | ICD-10-CM

## 2023-04-13 NOTE — Progress Notes (Signed)
Pt here for breath test only.

## 2023-04-15 LAB — H. PYLORI BREATH TEST: H. pylori Breath Test: NOT DETECTED

## 2023-04-18 DIAGNOSIS — Z6832 Body mass index (BMI) 32.0-32.9, adult: Secondary | ICD-10-CM | POA: Diagnosis not present

## 2023-04-18 DIAGNOSIS — D0510 Intraductal carcinoma in situ of unspecified breast: Secondary | ICD-10-CM | POA: Diagnosis not present

## 2023-04-18 DIAGNOSIS — N952 Postmenopausal atrophic vaginitis: Secondary | ICD-10-CM | POA: Diagnosis not present

## 2023-04-18 DIAGNOSIS — Z779 Other contact with and (suspected) exposures hazardous to health: Secondary | ICD-10-CM | POA: Diagnosis not present

## 2023-04-26 ENCOUNTER — Other Ambulatory Visit: Payer: PPO | Admitting: Pharmacist

## 2023-04-26 ENCOUNTER — Telehealth: Payer: Self-pay | Admitting: Pharmacist

## 2023-04-26 NOTE — Telephone Encounter (Signed)
Attempt was made to contact patient by phone today for follow up by Clinical Pharmacist regarding Ozempic and abdominal pain. We have been holding off on sending her 2025 application for Ozmepic / [employer] application due to abdominal pain that could be related to Ozempic.  Unable to reach patient. LM on VM with my contact number [phone] and office number [phone].   I am going to go ahead and sent medication assistance program application for Novo / Ozempic. She is to call me is she does not want me to send.  She will see GI in December.  I will follow up medication assistance program and Ozempic again after her appointment with GI.

## 2023-04-29 ENCOUNTER — Encounter: Payer: Self-pay | Admitting: Family Medicine

## 2023-04-29 NOTE — Telephone Encounter (Signed)
 Care team updated and letter sent for eye exam notes.

## 2023-05-09 ENCOUNTER — Ambulatory Visit
Admission: RE | Admit: 2023-05-09 | Discharge: 2023-05-09 | Disposition: A | Discharge status: Home / Self Care | Payer: PPO | Source: Ambulatory Visit | Attending: Family Medicine

## 2023-05-09 DIAGNOSIS — Z1231 Encounter for screening mammogram for malignant neoplasm of breast: Secondary | ICD-10-CM | POA: Diagnosis not present

## 2023-05-11 ENCOUNTER — Other Ambulatory Visit (HOSPITAL_BASED_OUTPATIENT_CLINIC_OR_DEPARTMENT_OTHER): Payer: Self-pay

## 2023-05-16 ENCOUNTER — Encounter: Payer: Self-pay | Admitting: Physician Assistant

## 2023-05-16 ENCOUNTER — Other Ambulatory Visit (HOSPITAL_BASED_OUTPATIENT_CLINIC_OR_DEPARTMENT_OTHER): Payer: Self-pay

## 2023-05-16 ENCOUNTER — Ambulatory Visit: Payer: PPO | Admitting: Physician Assistant

## 2023-05-16 VITALS — BP 110/74 | HR 83 | Ht 62.0 in | Wt 181.1 lb

## 2023-05-16 DIAGNOSIS — Z860101 Personal history of adenomatous and serrated colon polyps: Secondary | ICD-10-CM | POA: Diagnosis not present

## 2023-05-16 DIAGNOSIS — K219 Gastro-esophageal reflux disease without esophagitis: Secondary | ICD-10-CM | POA: Diagnosis not present

## 2023-05-16 DIAGNOSIS — K760 Fatty (change of) liver, not elsewhere classified: Secondary | ICD-10-CM | POA: Diagnosis not present

## 2023-05-16 MED ORDER — OMEPRAZOLE 40 MG PO CPDR
40.0000 mg | DELAYED_RELEASE_CAPSULE | Freq: Two times a day (BID) | ORAL | 0 refills | Status: DC
  Filled 2023-05-16: qty 180, 90d supply, fill #0

## 2023-05-16 MED ORDER — FAMOTIDINE 20 MG PO TABS
20.0000 mg | ORAL_TABLET | Freq: Every day | ORAL | 0 refills | Status: DC
  Filled 2023-05-16: qty 30, 30d supply, fill #0

## 2023-05-16 NOTE — Patient Instructions (Addendum)
We will give you a call to get you scheduled for a follow up.   We have sent the following medications to your pharmacy for you to pick up at your convenience: Pepcid. Omeprazole  _______________________________________________________  If your blood pressure at your visit was 140/90 or greater, please contact your primary care physician to follow up on this.  _______________________________________________________  If you are age 70 or older, your body mass index should be between 23-30. Your Body mass index is 33.13 kg/m. If this is out of the aforementioned range listed, please consider follow up with your Primary Care Provider.  If you are age 84 or younger, your body mass index should be between 19-25. Your Body mass index is 33.13 kg/m. If this is out of the aformentioned range listed, please consider follow up with your Primary Care Provider.   ________________________________________________________  The [place] [facility] providers would like to encourage you to use [facility] to communicate with providers for non-urgent requests or questions.  Due to long hold times on the telephone, sending your provider a message by [facility] may be a faster and more efficient way to get a response.  Please allow 48 business hours for a response.  Please remember that this is for non-urgent requests.  _______________________________________________________ It was a pleasure to see you today!  Thank you for trusting me with your gastrointestinal care!

## 2023-05-16 NOTE — Progress Notes (Signed)
Subjective:    Patient ID: CAMERA HAKER, female    DOB: May 19, 1951, 72 y.o.   MRN: [id number]  HPI Deserie is a pleasant 72 year old African-American female established with Dr. Russella Dar.  She has history of GERD with previous stricture, adenomatous colon polyps and NAFLD. She was last seen in the office in July 2022 and then underwent subsequent colonoscopy with finding of a 3 mm polyp in the ascending colon and 4 sessile polyps scattered from the rectum/sigmoid and transverse colon these were all 5 to 6 mm in size and noted internal hemorrhoids.  Path was consistent with 5 tubular adenomas and she is indicated for 3-year interval follow-up.  She comes in today after having an episode of abdominal pain for which she went to the emergency room in mid October.  She had been having some pain over the prior few months, had tried to get into the office but no appointments available and she eventually went to the emergency room because it continue to bother her.  It was not associated with any nausea or vomiting, had been having regular bowel movements.  Pain primarily located in her upper abdomen and more of an ongoing discomfort.  It was noted that she had started Ozempic and she did feel that her symptoms had started after she started Ozempic 6 months previously.  She had had a CT of her abdomen and pelvis 03/26/2023 that showed diffuse fatty changes of her liver normal common bile duct and gallbladder, unremarkable pancreas, multiple small bowel loops opposing the anterior peritoneum suggestive of adhesions no obstruction and diverticulosis present, there is also a wide-based paraumbilical hernia-of note the report read normal-appearing appendix.  Patient says she had an appendectomy at age 89 and is asking how she could still have an appendix present After that ER visit the omeprazole was increased to 40 mg twice daily and she was also given famotidine to use at nighttime as needed. She has not had  any increase in dosage of Ozempic says she is still on a very low dose and has not lost any weight.  However her GI symptoms have resolved at this point after being on the twice daily omeprazole, she is not requiring any famotidine.  No complaints of heartburn or indigestion and no current abdominal discomfort bowel movements are regular at this point using MiraLAX as needed.  Labs from October 2024 H. pylori breath test was done outpatient and negative LFTs within normal limits CBC normal/MCV 78 Lipase  81    Review of Systems Pertinent positive and negative review of systems were noted in the above HPI section.  All other review of systems was otherwise negative.   Outpatient Encounter Medications as of 05/16/2023  Medication Sig   albuterol (VENTOLIN HFA) 108 (90 Base) MCG/ACT inhaler Inhale 2 puffs by mouth into the lungs every 6 (six) hours as needed for wheezing or shortness of breath.   ASPIR-LOW 81 MG EC tablet TAKE 1 TABLET BY MOUTH DAILY   bisoprolol-hydrochlorothiazide (ZIAC) 5-6.25 MG tablet Take 1 tablet by mouth daily.   cetirizine (ZYRTEC) 10 MG tablet Take 10 mg by mouth daily as needed for allergies.   Continuous Glucose Receiver (FREESTYLE LIBRE 3 READER) DEVI Use as directed to check blood glucose continuously.   Continuous Glucose Sensor (FREESTYLE LIBRE 3 SENSOR) MISC Inject 1 sensor into the skin every 14 (fourteen) days. Use to check blood glucose continuously.   fluticasone (FLONASE) 50 MCG/ACT nasal spray Place 2 sprays into both nostrils  daily.   glucose blood (ONETOUCH VERIO) test strip Use as directed to test blood sugar twice daily   hyoscyamine (LEVSIN SL) 0.125 MG SL tablet Place 1 tablet (0.125 mg total) under the tongue every 4 (four) hours as needed.   Magnesium Oxide 400 MG CAPS Take 1 capsule (400 mg total) by mouth daily.   metFORMIN (GLUCOPHAGE) 1000 MG tablet Take 1 tablet (1,000 mg total) by mouth 2 (two) times daily with a meal.   rosuvastatin (CRESTOR)  5 MG tablet Take 1 tablet (5 mg total) by mouth daily.   Semaglutide,0.25 or 0.5MG /DOS, 2 MG/3ML SOPN Inject 0.5 mg into the skin once a week.   [DISCONTINUED] famotidine (PEPCID) 20 MG tablet Take 1 tablet (20 mg total) by mouth at bedtime.   [DISCONTINUED] omeprazole (PRILOSEC) 40 MG capsule Take 1 capsule (40 mg total) by mouth 2 (two) times daily.   famotidine (PEPCID) 20 MG tablet Take 1 tablet (20 mg total) by mouth at bedtime.   omeprazole (PRILOSEC) 40 MG capsule Take 1 capsule (40 mg total) by mouth 2 (two) times daily.   Facility-Administered Encounter Medications as of 05/16/2023  Medication   Chlorhexidine Gluconate Cloth 2 % PADS 6 each   And   Chlorhexidine Gluconate Cloth 2 % PADS 6 each   Allergies  Allergen Reactions   Pravastatin Other (See Comments)    Myalgia, fatigue, memory loss   Amoxicillin Itching    REACTION: rash   Atorvastatin Other (See Comments)    Myalgia, fatigue, memory loss   Codeine Nausea And Vomiting   Patient Active Problem List   Diagnosis Date Noted   BPPV (benign paroxysmal positional vertigo) 05/31/2022   Allergic rhinitis 05/31/2022   Constipation 01/29/2022   Chronic sinusitis 01/29/2022   HOCM (hypertrophic obstructive cardiomyopathy) (HCC) 06/26/2021   Heart palpitations 02/01/2021   Elevated lipase 02/01/2021   Diabetes mellitus without complication (HCC) 12/09/2020   Insomnia 07/23/2019   Shingles 02/18/2019   Ductal carcinoma in situ of breast 08/15/2018   Ductal carcinoma in situ (DCIS) of left breast 07/03/2018   Vertigo 03/13/2018   Ductal carcinoma in situ (DCIS) of right breast 03/10/2018   Right shoulder pain 10/03/2017   Headache 06/30/2017   Obesity (BMI 30-39.9) 05/02/2017   Seasonal allergies 10/31/2015   Hx of adenomatous colonic polyps 04/30/2014   Cervical cancer screening 01/22/2013   Preventative health care 01/22/2013   Chest pain 01/06/2012   Endometriosis    Nonalcoholic fatty liver disease 04/28/2010    OSA (obstructive sleep apnea) 03/26/2009   RT BUNDLE BRANCH BLOCK&LT ANT FASCICULAR BLOCK 12/20/2008   Thoracic back pain 10/10/2008   Osteoarthritis 07/05/2008   Abdominal pain 07/05/2008   Aneurysm, thoracoabdominal (HCC) 03/22/2007   Type 2 diabetes mellitus with hyperglycemia, without long-term current use of insulin (HCC) 10/28/2006   Hyperlipidemia, mixed 10/28/2006   Depression with anxiety 10/28/2006   Essential hypertension 10/28/2006   ESOPHAGEAL STRICTURE 05/04/2004   HIATAL HERNIA 05/04/2004   Social History   Socioeconomic History   Marital status: Married    Spouse name: Financial trader   Number of children: 2   Years of education: Not on file   Highest education level: GED or equivalent  Occupational History   Occupation: Insurance risk surveyor: [employer]  Tobacco Use   Smoking status: Never   Smokeless tobacco: Never  Substance and Sexual Activity   Alcohol use: No    Alcohol/week: 0.0 standard drinks of alcohol   Drug use: No  Sexual activity: Yes    Birth control/protection: Post-menopausal    Comment: lives with husband, no dietary restrictions just watching carbs, wears seat belt  Other Topics Concern   Not on file  Social History Narrative   Not on file   Social Determinants of Health   Financial Resource Strain: Low Risk  (03/29/2023)   Overall Financial Resource Strain (CARDIA)    Difficulty of Paying Living Expenses: Not very hard  Food Insecurity: No Food Insecurity (03/29/2023)   Hunger Vital Sign    Worried About Running Out of Food in the Last Year: Never true    Ran Out of Food in the Last Year: Never true  Transportation Needs: No Transportation Needs (03/29/2023)   PRAPARE - Administrator, Civil Service (Medical): No    Lack of Transportation (Non-Medical): No  Physical Activity: Inactive (03/29/2023)   Exercise Vital Sign    Days of Exercise per Week: 0 days    Minutes of Exercise per Session: 0 min  Stress: No Stress  Concern Present (03/29/2023)   [employer] of Occupational Health - Occupational Stress Questionnaire    Feeling of Stress : Not at all  Social Connections: Socially Integrated (03/29/2023)   Social Connection and Isolation Panel [NHANES]    Frequency of Communication with Friends and Family: More than three times a week    Frequency of Social Gatherings with Friends and Family: Twice a week    Attends Religious Services: More than 4 times per year    Active Member of [employer] or Organizations: Yes    Attends Banker Meetings: 1 to 4 times per year    Marital Status: Married  Catering manager Violence: Not At Risk (10/15/2022)   Humiliation, Afraid, Rape, and Kick questionnaire    Fear of Current or Ex-Partner: No    Emotionally Abused: No    Physically Abused: No    Sexually Abused: No    Ms. Tabora's family history includes Alcohol abuse in her maternal aunt; Breast cancer in her mother; Cancer in her maternal aunt; Diabetes in her brother, mother, and sister; Heart attack in her father and maternal grandfather; Heart disease in her father and maternal grandmother; Hyperlipidemia in her father; Hypertension in her brother, father, maternal grandfather, mother, and sister; Other in her mother; Stroke in her paternal grandmother.      Objective:    Vitals:   05/16/23 0952  BP: 110/74  Pulse: 83  SpO2: 99%    Physical Exam Well-developed well-nourished older female in no acute distress.  Height, Weight, 181 BMI 33.1  HEENT; nontraumatic normocephalic, EOMI, PE R LA, sclera anicteric. Oropharynx; not examined today Neck; supple, no JVD Cardiovascular; regular rate and rhythm with S1-S2, no murmur rub or gallop Pulmonary; Clear bilaterally Abdomen; soft, obese, nontender, nondistended, no palpable mass or hepatosplenomegaly, bowel sounds are active Rectal; not done today Skin; benign exam, no jaundice rash or appreciable lesions Extremities; no clubbing cyanosis  or edema skin warm and dry Neuro/Psych; alert and oriented x4, grossly nonfocal mood and affect appropriate        Assessment & Plan:   #74 72 year old African-American female with history of chronic GERD, history of esophageal stricture status post prior dilation who comes in after experiencing upper abdominal pain over the past several months which has since resolved. She had CT done in October 2024 negative for any acute changes, did mention possibility of adhesions Also mentions normal-appearing appendix and patient is status post appendectomy  I suspect her symptoms were related to Ozempic, she had been having some upper GI type symptoms since starting Ozempic and for that reason has not increased the dose.  After using twice daily PPI for the past month symptoms have completely resolved  #2 history of adenomatous colon polyps-up-to-date with colonoscopy will be due for follow-up colonoscopy July 2025  #3 fatty liver-normal LFTs #4 hypertension 5.  Adult onset diabetes mellitus 6.  HOCM 7.  Sleep apnea 8.  BPPV  Plan; Continue omeprazole 40 mg twice daily for 2 more months then if remains asymptomatic will try to drop back to 1 p.o. every morning She is not requiring any famotidine at this time, refill sent, could continue on a as needed basis Discussed multiple potential GI side effects with Ozempic. Will establish patient with Dr. Lavon Paganini, moving forward pending Dr. Ardell Isaacs retirement. Have  asked the patient to plan to follow-up in April or May 2025 and we will get her scheduled for colonoscopy at that time Will ask radiology to review her CT regarding presence or absence of appendix  Maleigha Colvard S Cecylia Brazill PA-C 05/16/2023   Cc: Bradd Canary, MD

## 2023-05-18 ENCOUNTER — Ambulatory Visit: Payer: PPO | Admitting: Pharmacist

## 2023-05-18 ENCOUNTER — Telehealth: Payer: Self-pay | Admitting: Family Medicine

## 2023-05-18 DIAGNOSIS — E119 Type 2 diabetes mellitus without complications: Secondary | ICD-10-CM

## 2023-05-18 DIAGNOSIS — Z7985 Long-term (current) use of injectable non-insulin antidiabetic drugs: Secondary | ICD-10-CM

## 2023-05-18 NOTE — Progress Notes (Signed)
05/18/2023 Name: [name] MRN: [id number] DOB: 10/29/1950  No chief complaint on file.   [name] is a 72 y.o. year old female who presented for a telephone visit.   They were referred to the pharmacist by their PCP for assistance in managing diabetes, hyperlipidemia, and medication access.    Subjective:  Care Team: Primary Care Provider: Bradd Canary, MD ; Next Scheduled Visit: 06/28/2023 Endocrinologist Dr Lonzo Cloud; Next Scheduled Visit: 07/13/2023  Medication Access/Adherence  Current Pharmacy:  [facility] [address], [address] [place] [address] Phone: [phone] Fax: [phone]  [name] [place] - [facility] [address] [place] [address] Phone: [phone] Fax: [phone]  [facility] [address] [place] [address] Phone: [phone] Fax: [phone]   Patient reports affordability concerns with their medications: Yes  - she has been approved 02/2023 to receive Ozempic thru 06/14/2023 from [employer] medication assistance program. We have submitted application for 2025. [employer] today and they needed additional information / address of patient's insurance plan.  Patient reports access/transportation concerns to their pharmacy: No  Patient reports adherence concerns with their medications:  No     Diabetes:  Current medications: metformin 1000mg  twice a day, Ozempic 0.5mg  weekly  Medications tried in the past: glipizide - changed to repaglinide, Januvia, Invokana - stopped due to cost, Rybelsus - started 07/2020 - but stopped at end of 2022 due to inability to qualify for patient assistance program and possibly headaches. Repaglinide stopped 12/2022 due to hypoglycemia.  Current glucose readings: 100 to 175  Using One Touch glucometer and also uses Continuous Glucose System - Libre 3  With  [place] 3 she is using cell phone / Libre 3 app.   Average blood glucose last 7 days = 122 Average blood glucose 30 days =  123 Average blood glucose 90 days  = 128  Patient denies hypoglycemic s/sx including no dizziness, shakiness, sweating. Patient denies hyperglycemic symptoms including no polyuria, polydipsia, polyphagia, nocturia, neuropathy, blurred vision. She reports abdominal pain has improved. Saw GI yesterday 12/3.   Current meal patterns:  Usually eats 2 meals per day - breakfast and evening Has small meal / snack in middle of day.   Hyperlipidemia/ASCVD Risk Reduction  Current lipid lowering medications: rosuvastatin  Antiplatelet regimen: aspirin 81mg  daily     Objective:  Lab Results  Component Value Date   HGBA1C 7.0 (H) 02/15/2023    Lab Results  Component Value Date   CREATININE 0.90 03/26/2023   BUN 15 03/26/2023   NA 139 03/26/2023   K 4.0 03/26/2023   CL 103 03/26/2023   CO2 27 03/26/2023    Lab Results  Component Value Date   CHOL 120 02/15/2023   HDL 38.60 (L) 02/15/2023   LDLCALC 59 02/15/2023   LDLDIRECT 96.0 08/19/2020   TRIG 111.0 02/15/2023   CHOLHDL 3 02/15/2023    Medications Reviewed Today   Medications were not reviewed in this encounter       Assessment/Plan:   Diabetes: Last A1c was at goal   - Reviewed goal A1c, goal fasting, and goal 2 hour post prandial glucose  - Recommend to continue metformin 1000mg  twice a day.  - Continue Ozempic 0.5mg  weekly.  [employer] and supplied needed information for her 2025 application. They needed insurance address. They report it should take 24 to 48 hours to get decision about  2025 medication assistance program. Patient endorses she has 2 pens of Ozempic currently.    Hyperlipidemia/ASCVD Risk Reduction: LDL at goal of < 70 - Recommend to continue rosuvastatin    Medication management:  Reviewed med list with patient and updated Reviewed refill history   Follow Up  Plan: 3 to 4 months  Henrene Pastor, PharmD Clinical Pharmacist [facility]

## 2023-05-18 NOTE — Telephone Encounter (Signed)
Patient was called later 12/04 - completed phone appointment

## 2023-05-18 NOTE — Telephone Encounter (Signed)
Pt states she did not receive a call for her appt with Tammy today and would like to r/s.

## 2023-06-07 ENCOUNTER — Other Ambulatory Visit: Payer: Self-pay | Admitting: Family Medicine

## 2023-06-07 ENCOUNTER — Other Ambulatory Visit: Payer: Self-pay

## 2023-06-07 ENCOUNTER — Other Ambulatory Visit (HOSPITAL_BASED_OUTPATIENT_CLINIC_OR_DEPARTMENT_OTHER): Payer: Self-pay

## 2023-06-07 MED ORDER — FREESTYLE LIBRE 3 SENSOR MISC
1.0000 | 3 refills | Status: DC
  Filled 2023-06-07: qty 2, 28d supply, fill #0
  Filled 2023-07-04: qty 2, 28d supply, fill #1
  Filled 2023-08-04: qty 2, 28d supply, fill #2
  Filled 2023-08-29: qty 2, 28d supply, fill #3

## 2023-06-07 MED ORDER — CETIRIZINE HCL 10 MG PO TABS
10.0000 mg | ORAL_TABLET | Freq: Every day | ORAL | 3 refills | Status: AC | PRN
  Filled 2023-06-07: qty 100, 90d supply, fill #0
  Filled 2024-01-15: qty 100, 90d supply, fill #1

## 2023-06-27 NOTE — Assessment & Plan Note (Signed)
Encouraged DASH diet, decrease po intake and increase exercise as tolerated. Needs 7-8 hours of sleep nightly. Avoid trans fats, eat small, frequent meals every 4-5 hours with lean proteins, complex carbs and healthy fats. Minimize simple carbs, GMO foods. 

## 2023-06-27 NOTE — Assessment & Plan Note (Signed)
 Well controlled, no changes to meds. Encouraged heart healthy diet such as the DASH diet and exercise as tolerated.

## 2023-06-27 NOTE — Assessment & Plan Note (Signed)
 Tolerating statin, encouraged heart healthy diet, avoid trans fats, minimize simple carbs and saturated fats. Increase exercise as tolerated

## 2023-06-27 NOTE — Assessment & Plan Note (Signed)
 hgba1c acceptable, minimize simple carbs. Increase exercise as tolerated. Continue current meds

## 2023-06-28 ENCOUNTER — Ambulatory Visit: Payer: PPO | Admitting: Family Medicine

## 2023-06-28 VITALS — BP 118/72 | HR 80 | Temp 98.4°F | Resp 18 | Ht 62.0 in | Wt 181.2 lb

## 2023-06-28 DIAGNOSIS — Z7985 Long-term (current) use of injectable non-insulin antidiabetic drugs: Secondary | ICD-10-CM

## 2023-06-28 DIAGNOSIS — E669 Obesity, unspecified: Secondary | ICD-10-CM | POA: Diagnosis not present

## 2023-06-28 DIAGNOSIS — E119 Type 2 diabetes mellitus without complications: Secondary | ICD-10-CM

## 2023-06-28 DIAGNOSIS — I10 Essential (primary) hypertension: Secondary | ICD-10-CM | POA: Diagnosis not present

## 2023-06-28 DIAGNOSIS — Z23 Encounter for immunization: Secondary | ICD-10-CM | POA: Diagnosis not present

## 2023-06-28 DIAGNOSIS — Z6833 Body mass index (BMI) 33.0-33.9, adult: Secondary | ICD-10-CM

## 2023-06-28 DIAGNOSIS — E782 Mixed hyperlipidemia: Secondary | ICD-10-CM | POA: Diagnosis not present

## 2023-06-28 DIAGNOSIS — M67431 Ganglion, right wrist: Secondary | ICD-10-CM

## 2023-06-28 DIAGNOSIS — M25531 Pain in right wrist: Secondary | ICD-10-CM

## 2023-06-28 LAB — CBC WITH DIFFERENTIAL/PLATELET
Basophils Absolute: 0.1 10*3/uL (ref 0.0–0.1)
Basophils Relative: 0.9 % (ref 0.0–3.0)
Eosinophils Absolute: 0.1 10*3/uL (ref 0.0–0.7)
Eosinophils Relative: 1.4 % (ref 0.0–5.0)
HCT: 39.1 % (ref 36.0–46.0)
Hemoglobin: 12.4 g/dL (ref 12.0–15.0)
Lymphocytes Relative: 33.1 % (ref 12.0–46.0)
Lymphs Abs: 1.9 10*3/uL (ref 0.7–4.0)
MCHC: 31.6 g/dL (ref 30.0–36.0)
MCV: 77.8 fL — ABNORMAL LOW (ref 78.0–100.0)
Monocytes Absolute: 0.4 10*3/uL (ref 0.1–1.0)
Monocytes Relative: 7.2 % (ref 3.0–12.0)
Neutro Abs: 3.3 10*3/uL (ref 1.4–7.7)
Neutrophils Relative %: 57.4 % (ref 43.0–77.0)
Platelets: 259 10*3/uL (ref 150.0–400.0)
RBC: 5.03 Mil/uL (ref 3.87–5.11)
RDW: 15 % (ref 11.5–15.5)
WBC: 5.7 10*3/uL (ref 4.0–10.5)

## 2023-06-28 LAB — COMPREHENSIVE METABOLIC PANEL
ALT: 37 U/L — ABNORMAL HIGH (ref 0–35)
AST: 25 U/L (ref 0–37)
Albumin: 4.3 g/dL (ref 3.5–5.2)
Alkaline Phosphatase: 87 U/L (ref 39–117)
BUN: 12 mg/dL (ref 6–23)
CO2: 28 meq/L (ref 19–32)
Calcium: 9.9 mg/dL (ref 8.4–10.5)
Chloride: 101 meq/L (ref 96–112)
Creatinine, Ser: 0.98 mg/dL (ref 0.40–1.20)
GFR: 58.36 mL/min — ABNORMAL LOW (ref >60.00)
Glucose, Bld: 93 mg/dL (ref 70–99)
Potassium: 4.3 meq/L (ref 3.5–5.1)
Sodium: 140 meq/L (ref 135–145)
Total Bilirubin: 0.4 mg/dL (ref 0.2–1.2)
Total Protein: 7.2 g/dL (ref 6.0–8.3)

## 2023-06-28 LAB — LIPID PANEL
Cholesterol: 128 mg/dL (ref 0–200)
HDL: 43.5 mg/dL (ref >39.00)
LDL Cholesterol: 59 mg/dL (ref 0–99)
NonHDL: 84.31
Total CHOL/HDL Ratio: 3
Triglycerides: 129 mg/dL (ref 0.0–149.0)
VLDL: 25.8 mg/dL (ref 0.0–40.0)

## 2023-06-28 LAB — HEMOGLOBIN A1C: Hgb A1c MFr Bld: 7.4 % — ABNORMAL HIGH (ref 4.6–6.5)

## 2023-06-28 LAB — TSH: TSH: 1.13 u[IU]/mL (ref 0.35–5.50)

## 2023-06-28 NOTE — Patient Instructions (Addendum)
 RSV, Respiratory Syncitial Virus Vaccine, Arexvy at pharmacy  Tetanus if injured  Annual covid and flu

## 2023-06-29 LAB — MICROALBUMIN / CREATININE URINE RATIO
Creatinine,U: 88.6 mg/dL
Microalb Creat Ratio: 0.8 mg/g (ref 0.0–30.0)
Microalb, Ur: <0.7 mg/dL (ref 0.0–1.9)

## 2023-06-29 NOTE — Progress Notes (Signed)
 Subjective:    Patient ID: [name], female    DOB: [birth year], 73 y.o.   MRN: [id number]  Chief Complaint  Patient presents with  . Follow-up    4 month    HPI Discussed the use of AI scribe software for clinical note transcription with the patient, who gave verbal consent to proceed.  History of Present Illness   The patient, with a history of diabetes, presents with a knot on their arm that has been causing discomfort. The knot, which has been noticed for about a week, is surrounded by a bruise. The patient denies any specific injury to the area and has been experiencing some symptoms of arthritis. The patient has been managing their diabetes with the help of a continuous glucose monitor (CGM) and Ozempic . The patient reports that their blood sugar levels have been fairly stable, with occasional spikes and drops. However, the patient has been experiencing some gastrointestinal upset due to Ozempic .        Past Medical History:  Diagnosis Date  . Acute bronchitis 10/31/2015  . Allergy    seasonal  . Arthritis    knee-left  . Cancer (HCC) 04/2018   right breast ca  . Cataract    right eye  . Diabetes mellitus 52   type 2  . Diaphragmatic hernia without mention of obstruction or gangrene   . Endometriosis   . Esophageal reflux   . Esophageal stricture   . GERD (gastroesophageal reflux disease)   . HOCM (hypertrophic obstructive cardiomyopathy) (HCC)    Basal septal hypertrophy 16 mm with increased ECV at 30% on cardiac MRI 06/2021 consistent with HOCM  . Hyperlipidemia   . Hyperplastic colon polyp   . Hypertension 52  . Nonalcoholic fatty liver disease 04/28/2010   Qualifier: Diagnosis of  By: Mavis MD, Norleen BRAVO   . Nonspecific abnormal results of liver function study   . NSVT (nonsustained ventricular tachycardia) (HCC)    noted on event monitor 05/2021  . Personal history of radiation therapy 08/2018   bilateral breasts  . Pharyngitis 02/18/2012  .  Plantar fascial fibromatosis   . Preventative health care 01/22/2013  . Seasonal allergies 10/31/2015  . Sleep apnea    has oral appliance but does not fit well due to missing teeth  . Sore throat 04/03/2017  . Tubular adenoma of colon   . Unspecified sleep apnea    no c-pap    Past Surgical History:  Procedure Laterality Date  . ABDOMINAL HYSTERECTOMY  1982   partial  . APPENDECTOMY    . 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 BREAST BIOPSY Bilateral 04/03/2018  . BREAST BIOPSY Bilateral 03/23/2018  . BREAST CYST INCISION AND DRAINAGE     right breast  . BREAST EXCISIONAL BIOPSY Right 2019  . BREAST LUMPECTOMY Bilateral 04/2018  . BREAST LUMPECTOMY WITH RADIOACTIVE SEED LOCALIZATION Bilateral 05/02/2018   Procedure: RIGHT BREAST SEED LUMPECTOMY X2 AND LEFT BREAST SEED GUIDED LUMPECTOMY;  Surgeon: Aron Shoulders, MD;  Location: [facility];  Service: General;  Laterality: Bilateral;  . CATARACT EXTRACTION     right eye  . HERNIA REPAIR     per pt, she is not aware of this surgery  . RE-EXCISION OF BREAST LUMPECTOMY Right 05/25/2018   Procedure: RE-EXCISION OF RIGHT BREAST LUMPECTOMY;  Surgeon: Aron Shoulders, MD;  Location: [facility];  Service: General;  Laterality: Right;  . seba    . TUBAL LIGATION  Family History  Problem Relation Age of Onset  . Breast cancer Mother   . Hypertension Mother   . Diabetes Mother        type 2  . Other Mother        esophagus surgery  . Heart disease Father   . Hyperlipidemia Father   . Hypertension Father   . Heart attack Father        MI at age 84  . Diabetes Brother        type 2  . Heart disease Maternal Grandmother   . Heart attack Maternal Grandfather   . Hypertension Maternal Grandfather   . Stroke Paternal Grandmother   . Hypertension Sister   . Diabetes Sister   . Hypertension Brother   . Alcohol abuse Maternal Aunt   . Cancer Maternal Aunt   . Colon cancer Neg Hx   . Stomach cancer  Neg Hx   . Esophageal cancer Neg Hx     Social History   Socioeconomic History  . Marital status: Married    Spouse name: Evalene  . Number of children: 2  . Years of education: Not on file  . Highest education level: GED or equivalent  Occupational History  . Occupation: Insurance Risk Surveyor: [employer]  Tobacco Use  . Smoking status: Never  . Smokeless tobacco: Never  Substance and Sexual Activity  . Alcohol use: No    Alcohol/week: 0.0 standard drinks of alcohol  . Drug use: No  . Sexual activity: Yes    Birth control/protection: Post-menopausal    Comment: lives with husband, no dietary restrictions just watching carbs, wears seat belt  Other Topics Concern  . Not on file  Social History Narrative  . Not on file   Social Drivers of Health   Financial Resource Strain: Low Risk  (03/29/2023)   Overall Financial Resource Strain (CARDIA)   . Difficulty of Paying Living Expenses: Not very hard  Food Insecurity: No Food Insecurity (03/29/2023)   Hunger Vital Sign   . Worried About Programme Researcher, Broadcasting/film/video in the Last Year: Never true   . Ran Out of Food in the Last Year: Never true  Transportation Needs: No Transportation Needs (03/29/2023)   PRAPARE - Transportation   . Lack of Transportation (Medical): No   . Lack of Transportation (Non-Medical): No  Physical Activity: Inactive (03/29/2023)   Exercise Vital Sign   . Days of Exercise per Week: 0 days   . Minutes of Exercise per Session: 0 min  Stress: No Stress Concern Present (03/29/2023)   [employer] of Occupational Health - Occupational Stress Questionnaire   . Feeling of Stress : Not at all  Social Connections: Socially Integrated (03/29/2023)   Social Connection and Isolation Panel [NHANES]   . Frequency of Communication with Friends and Family: More than three times a week   . Frequency of Social Gatherings with Friends and Family: Twice a week   . Attends Religious Services: More than 4 times per  year   . Active Member of Clubs or Organizations: Yes   . Attends Banker Meetings: 1 to 4 times per year   . Marital Status: Married  Catering Manager Violence: Not At Risk (10/15/2022)   Humiliation, Afraid, Rape, and Kick questionnaire   . Fear of Current or Ex-Partner: No   . Emotionally Abused: No   . Physically Abused: No   . Sexually Abused: No    Outpatient Medications Prior to  Visit  Medication Sig Dispense Refill  . albuterol  (VENTOLIN  HFA) 108 (90 Base) MCG/ACT inhaler Inhale 2 puffs by mouth into the lungs every 6 (six) hours as needed for wheezing or shortness of breath. 6.7 g 0  . ASPIR-LOW 81 MG EC tablet TAKE 1 TABLET BY MOUTH DAILY 30 tablet 3  . bisoprolol -hydrochlorothiazide  (ZIAC ) 5-6.25 MG tablet Take 1 tablet by mouth daily. 90 tablet 1  . cetirizine  (ZYRTEC ) 10 MG tablet Take 1 tablet (10 mg total) by mouth daily as needed for allergies or rhinitis. 100 tablet 3  . Continuous Glucose Receiver (FREESTYLE LIBRE 3 READER) DEVI Use as directed to check blood glucose continuously. 1 each 0  . Continuous Glucose Sensor (FREESTYLE LIBRE 3 SENSOR) MISC Inject 1 sensor into the skin every 14 (fourteen) days. Use to check blood glucose continuously. 2 each 3  . famotidine  (PEPCID ) 20 MG tablet Take 1 tablet (20 mg total) by mouth at bedtime. 30 tablet 0  . fluticasone  (FLONASE ) 50 MCG/ACT nasal spray Place 2 sprays into both nostrils daily. 16 g 5  . glucose blood (ONETOUCH VERIO) test strip Use as directed to test blood sugar twice daily 100 each 2  . hyoscyamine  (LEVSIN  SL) 0.125 MG SL tablet Place 1 tablet (0.125 mg total) under the tongue every 4 (four) hours as needed. 30 tablet 1  . Magnesium  Oxide 400 MG CAPS Take 1 capsule (400 mg total) by mouth daily. 90 capsule 3  . metFORMIN  (GLUCOPHAGE ) 1000 MG tablet Take 1 tablet (1,000 mg total) by mouth 2 (two) times daily with a meal. 180 tablet 3  . omeprazole  (PRILOSEC) 40 MG capsule Take 1 capsule (40 mg total)  by mouth 2 (two) times daily. 180 capsule 0  . rosuvastatin  (CRESTOR ) 5 MG tablet Take 1 tablet (5 mg total) by mouth daily. 90 tablet 1  . Semaglutide ,0.25 or 0.5MG /DOS, 2 MG/3ML SOPN Inject 0.5 mg into the skin once a week. 9 mL 3   Facility-Administered Medications Prior to Visit  Medication Dose Route Frequency Provider Last Rate Last Admin  . Chlorhexidine  Gluconate Cloth 2 % PADS 6 each  6 each Topical Once Byerly, Faera, MD       And  . Chlorhexidine  Gluconate Cloth 2 % PADS 6 each  6 each Topical Once Byerly, Faera, MD        Allergies  Allergen Reactions  . Pravastatin  Other (See Comments)    Myalgia, fatigue, memory loss  . Amoxicillin Itching    REACTION: rash  . Atorvastatin  Other (See Comments)    Myalgia, fatigue, memory loss  . Codeine Nausea And Vomiting    Review of Systems  Constitutional:  Negative for fever and malaise/fatigue.  HENT:  Negative for congestion.   Eyes:  Negative for blurred vision.  Respiratory:  Negative for shortness of breath.   Cardiovascular:  Negative for chest pain, palpitations and leg swelling.  Gastrointestinal:  Negative for abdominal pain, blood in stool and nausea.  Genitourinary:  Negative for dysuria and frequency.  Musculoskeletal:  Positive for joint pain. Negative for falls.  Skin:  Negative for rash.  Neurological:  Negative for dizziness, loss of consciousness and headaches.  Endo/Heme/Allergies:  Negative for environmental allergies.  Psychiatric/Behavioral:  Negative for depression. The patient is not nervous/anxious.        Objective:    Physical Exam Constitutional:      General: She is not in acute distress.    Appearance: Normal appearance. She is well-developed. She is not  toxic-appearing.  HENT:     Head: Normocephalic and atraumatic.     Right Ear: External ear normal.     Left Ear: External ear normal.     Nose: Nose normal.  Eyes:     General:        Right eye: No discharge.        Left eye: No  discharge.     Conjunctiva/sclera: Conjunctivae normal.  Neck:     Thyroid : No thyromegaly.  Cardiovascular:     Rate and Rhythm: Normal rate and regular rhythm.     Heart sounds: Normal heart sounds. No murmur heard. Pulmonary:     Effort: Pulmonary effort is normal. No respiratory distress.     Breath sounds: Normal breath sounds.  Abdominal:     General: Bowel sounds are normal.     Palpations: Abdomen is soft.     Tenderness: There is no abdominal tenderness. There is no guarding.  Musculoskeletal:        General: Normal range of motion.     Cervical back: Neck supple.  Lymphadenopathy:     Cervical: No cervical adenopathy.  Skin:    General: Skin is warm and dry.  Neurological:     Mental Status: She is alert and oriented to person, place, and time.  Psychiatric:        Mood and Affect: Mood normal.        Behavior: Behavior normal.        Thought Content: Thought content normal.        Judgment: Judgment normal.    BP 118/72 (BP Location: Left Arm, Patient Position: Sitting, Cuff Size: Normal)   Pulse 80   Temp 98.4 F (36.9 C) (Oral)   Resp 18   Ht 5' 2 (1.575 m)   Wt 181 lb 3.2 oz (82.2 kg)   SpO2 98%   BMI 33.14 kg/m  Wt Readings from Last 3 Encounters:  06/28/23 181 lb 3.2 oz (82.2 kg)  05/16/23 181 lb 2 oz (82.2 kg)  03/30/23 185 lb (83.9 kg)    Diabetic Foot Exam - Simple   No data filed    Lab Results  Component Value Date   WBC 5.7 06/28/2023   HGB 12.4 06/28/2023   HCT 39.1 06/28/2023   PLT 259.0 06/28/2023   GLUCOSE 93 06/28/2023   CHOL 128 06/28/2023   TRIG 129.0 06/28/2023   HDL 43.50 06/28/2023   LDLDIRECT 96.0 08/19/2020   LDLCALC 59 06/28/2023   ALT 37 (H) 06/28/2023   AST 25 06/28/2023   NA 140 06/28/2023   K 4.3 06/28/2023   CL 101 06/28/2023   CREATININE 0.98 06/28/2023   BUN 12 06/28/2023   CO2 28 06/28/2023   TSH 1.13 06/28/2023   HGBA1C 7.4 (H) 06/28/2023   MICROALBUR <0.7 06/28/2023    Lab Results  Component  Value Date   TSH 1.13 06/28/2023   Lab Results  Component Value Date   WBC 5.7 06/28/2023   HGB 12.4 06/28/2023   HCT 39.1 06/28/2023   MCV 77.8 (L) 06/28/2023   PLT 259.0 06/28/2023   Lab Results  Component Value Date   NA 140 06/28/2023   K 4.3 06/28/2023   CO2 28 06/28/2023   GLUCOSE 93 06/28/2023   BUN 12 06/28/2023   CREATININE 0.98 06/28/2023   BILITOT 0.4 06/28/2023   ALKPHOS 87 06/28/2023   AST 25 06/28/2023   ALT 37 (H) 06/28/2023   PROT 7.2 06/28/2023   ALBUMIN  4.3 06/28/2023   CALCIUM  9.9 06/28/2023   ANIONGAP 9 03/26/2023   EGFR 60 05/29/2021   GFR 58.36 (L) 06/28/2023   Lab Results  Component Value Date   CHOL 128 06/28/2023   Lab Results  Component Value Date   HDL 43.50 06/28/2023   Lab Results  Component Value Date   LDLCALC 59 06/28/2023   Lab Results  Component Value Date   TRIG 129.0 06/28/2023   Lab Results  Component Value Date   CHOLHDL 3 06/28/2023   Lab Results  Component Value Date   HGBA1C 7.4 (H) 06/28/2023       Assessment & Plan:  Diabetes mellitus without complication (HCC) Assessment & Plan: hgba1c acceptable, minimize simple carbs. Increase exercise as tolerated. Continue current meds  Orders: -     Comprehensive metabolic panel -     Hemoglobin A1c -     Microalbumin / creatinine urine ratio  Essential hypertension Assessment & Plan: Well controlled, no changes to meds. Encouraged heart healthy diet such as the DASH diet and exercise as tolerated.   Orders: -     CBC with Differential/Platelet -     TSH  Hyperlipidemia, mixed Assessment & Plan: Tolerating statin, encouraged heart healthy diet, avoid trans fats, minimize simple carbs and saturated fats. Increase exercise as tolerated  Orders: -     Lipid panel  Obesity (BMI 30-39.9) Assessment & Plan: Encouraged DASH diet, decrease po intake and increase exercise as tolerated. Needs 7-8 hours of sleep nightly. Avoid trans fats, eat small, frequent meals  every 4-5 hours with lean proteins, complex carbs and healthy fats. Minimize simple carbs, GMO foods.   Need for influenza vaccination -     Flu Vaccine Trivalent High Dose (Fluad)  Ganglion cyst of wrist, right -     Ambulatory referral to Orthopedic Surgery  Right wrist pain -     Ambulatory referral to Orthopedic Surgery    Assessment and Plan    Right Wrist Cyst Newly noticed cyst on right wrist with associated bruising and tenderness. No history of trauma. Differential diagnosis includes ganglion cyst, but the presence of bruising is unusual. -Refer to hand specialist at [facility] for further evaluation and possible ultrasound.  Diabetes Patient on Ozempic  0.5mg  with some GI side effects but overall improvement in blood glucose control. Recent blood glucose readings from CGM are satisfactory. -Continue Ozempic  0.5mg  and monitor for GI side effects. -Check HbA1c, she follows with endocrinology for management  Vaccinations Patient has received flu shot but not recent COVID-19 booster. Discussed the benefits of RSV vaccination. -Administer COVID-19 booster and RSV vaccination at the pharmacy.  Follow-up Plan for 6-month and 14-month follow-up visits. The 67-month visit will be a longer appointment.         Harlene Horton, MD

## 2023-07-04 ENCOUNTER — Other Ambulatory Visit: Payer: Self-pay | Admitting: Physician Assistant

## 2023-07-05 ENCOUNTER — Other Ambulatory Visit (HOSPITAL_BASED_OUTPATIENT_CLINIC_OR_DEPARTMENT_OTHER): Payer: Self-pay

## 2023-07-05 ENCOUNTER — Other Ambulatory Visit: Payer: Self-pay

## 2023-07-05 MED ORDER — FAMOTIDINE 20 MG PO TABS
20.0000 mg | ORAL_TABLET | Freq: Every day | ORAL | 0 refills | Status: DC
  Filled 2023-07-05: qty 30, 30d supply, fill #0

## 2023-07-13 ENCOUNTER — Other Ambulatory Visit (HOSPITAL_BASED_OUTPATIENT_CLINIC_OR_DEPARTMENT_OTHER): Payer: Self-pay

## 2023-07-13 ENCOUNTER — Ambulatory Visit: Payer: PPO | Admitting: Internal Medicine

## 2023-07-13 ENCOUNTER — Encounter: Payer: Self-pay | Admitting: Internal Medicine

## 2023-07-13 VITALS — BP 120/80 | HR 79 | Ht 62.0 in | Wt 180.0 lb

## 2023-07-13 DIAGNOSIS — Z7985 Long-term (current) use of injectable non-insulin antidiabetic drugs: Secondary | ICD-10-CM

## 2023-07-13 DIAGNOSIS — E785 Hyperlipidemia, unspecified: Secondary | ICD-10-CM | POA: Diagnosis not present

## 2023-07-13 DIAGNOSIS — E1165 Type 2 diabetes mellitus with hyperglycemia: Secondary | ICD-10-CM

## 2023-07-13 DIAGNOSIS — Z7984 Long term (current) use of oral hypoglycemic drugs: Secondary | ICD-10-CM | POA: Diagnosis not present

## 2023-07-13 MED ORDER — EMPAGLIFLOZIN 10 MG PO TABS
10.0000 mg | ORAL_TABLET | Freq: Every day | ORAL | 3 refills | Status: DC
  Filled 2023-07-13: qty 90, 90d supply, fill #0
  Filled 2023-10-10: qty 90, 90d supply, fill #1

## 2023-07-13 MED ORDER — ROSUVASTATIN CALCIUM 5 MG PO TABS
5.0000 mg | ORAL_TABLET | Freq: Every day | ORAL | 3 refills | Status: DC
  Filled 2023-07-13 – 2023-09-14 (×2): qty 90, 90d supply, fill #0
  Filled 2023-12-15: qty 90, 90d supply, fill #1

## 2023-07-13 MED ORDER — METFORMIN HCL 1000 MG PO TABS
1000.0000 mg | ORAL_TABLET | Freq: Two times a day (BID) | ORAL | 3 refills | Status: DC
  Filled 2023-07-13 – 2023-08-11 (×2): qty 180, 90d supply, fill #0
  Filled 2023-11-07: qty 180, 90d supply, fill #1

## 2023-07-13 NOTE — Patient Instructions (Signed)
-   STOP  Ozempic  - Start Jardiance 10 mg, 1 tablet every morning  - Continue Metformin 1000 mg , 1 tablet with breakfast and 1 tablet with dinner   Please use daily stool softeners and magnesium tablets to help with the constipation   HOW TO TREAT LOW BLOOD SUGARS (Blood sugar LESS THAN 70 MG/DL) Please follow the RULE OF 15 for the treatment of hypoglycemia treatment (when your (blood sugars are less than 70 mg/dL)   STEP 1: Take 15 grams of carbohydrates when your blood sugar is low, which includes:  3-4 GLUCOSE TABS  OR 3-4 OZ OF JUICE OR REGULAR SODA OR ONE TUBE OF GLUCOSE GEL    STEP 2: RECHECK blood sugar in 15 MINUTES STEP 3: If your blood sugar is still low at the 15 minute recheck --> then, go back to STEP 1 and treat AGAIN with another 15 grams of carbohydrates.

## 2023-07-13 NOTE — Progress Notes (Signed)
Name: [name]  Age/ Sex: 73 y.o., female   MRN/ DOB: [id number], March 20, 1951     PCP: Bradd Canary, MD   Reason for Endocrinology Evaluation: Type 2 Diabetes Mellitus  Initial Endocrine Consultative Visit: 02/22/2017    Benson IDENTIFIER: [name] is a 73 y.o. female with a past medical history of T2DM, HTN , Dyslipidemia, Hx of breast Ca ( S/P right lumpectomy) . [name] has followed with Endocrinology clinic since 02/16/2019 for consultative assistance with management of her diabetes.     DIABETIC HISTORY:  [name] was diagnosed with T2DM in 2001. [name] has been on metformin, Glimepiride and Januvia. Invokana added in 2018 but was cost prohibitive. Her hemoglobin A1c has ranged from 7.0% in 2016, peaking at 8.8% in  2021.    [name] used to see Dr.Gherghe but was lost to follow up by 2019 , until her return to our clinic 08/2019.    Started Rybelsus 08/2020 but this had to be stopped by Christine end of 2022 due to inability to qualify for [name] assistance   Switched glipizide to repaglinide 01/2022 Started Ozempic 07/2022  Stopped repaglinide 12/2022, with an A1c of 7.1%  SUBJECTIVE:   During Christine last visit (01/10/2023):  A1c  7.1 %     Today (07/13/2023): [name] is here for a follow up on diabetes management.  [name] checks her blood sugars 2 times daily [name] has had hypoglycemic episodes since Christine last clinic visit, which typically occur during Christine day , [name] has not been symptomatic    [name] presented to Christine ED 03/2023 for generalized abdominal pain, imaging unremarkable, [name] has been evaluated by GI, on omeprazole  Continues with night time abdominal pain  Has nausea  but no vomiting  Has constipation or diarrhea    HOME DIABETES REGIMEN:  Metformin 1000 mg, BID Ozempic 0.5 mg weekly- on [name] assistance    Statin:  Crestor ACE-I/ARB: No   CONTINUOUS GLUCOSE MONITORING RECORD INTERPRETATION    Dates of Recording: 1/16-1/29/2025  Sensor  description:freestyle  Results statistics:   CGM use % of time 96  Average and SD 133/28.3  Time in range 88 %  % Time Above 180 10  % Time above 250 1  % Time Below target 1   Glycemic patterns summary: BG's are optimal for Christine day and Christine night  Hyperglycemic episodes postprandial  Hypoglycemic episodes occurred at variable times  Overnight periods: Optimal   DIABETIC COMPLICATIONS: Microvascular complications:   Denies: CKD, retinopathy , neuropathy Last Eye Exam: Completed 01/14/2023  Macrovascular complications:   Denies: CAD, CVA, PVD   HISTORY:  Past Medical History:  Past Medical History:  Diagnosis Date   Acute bronchitis 10/31/2015   Allergy    seasonal   Arthritis    knee-left   Cancer (HCC) 04/2018   right breast ca   Cataract    right eye   Diabetes mellitus 52   type 2   Diaphragmatic hernia without mention of obstruction or gangrene    Endometriosis    Esophageal reflux    Esophageal stricture    GERD (gastroesophageal reflux disease)    HOCM (hypertrophic obstructive cardiomyopathy) (HCC)    Basal septal hypertrophy 16 mm with increased ECV at 30% on cardiac MRI 06/2021 consistent with HOCM   Hyperlipidemia    Hyperplastic colon polyp    Hypertension 52   Nonalcoholic fatty liver disease 04/28/2010   Qualifier: Diagnosis of  By: Lovell Sheehan MD, John E    Nonspecific abnormal results of liver function study    NSVT (nonsustained ventricular tachycardia) (HCC)    noted on event monitor 05/2021   Personal history of radiation therapy 08/2018   bilateral breasts   Pharyngitis 02/18/2012   Plantar fascial fibromatosis    Preventative health care 01/22/2013   Seasonal allergies 10/31/2015   Sleep apnea    has oral appliance but does not fit well due to missing teeth   Sore throat 04/03/2017   Tubular adenoma of colon    Unspecified sleep apnea    no c-pap   Past Surgical History:  Past Surgical History:  Procedure Laterality Date    ABDOMINAL HYSTERECTOMY  1982   partial   APPENDECTOMY     benigh cyst in lung  2006   right   BREAST BIOPSY Bilateral 04/03/2018   BREAST BIOPSY Bilateral 03/23/2018   BREAST CYST INCISION AND DRAINAGE     right breast   BREAST EXCISIONAL BIOPSY Right 2019   BREAST LUMPECTOMY Bilateral 04/2018   BREAST LUMPECTOMY WITH RADIOACTIVE SEED LOCALIZATION Bilateral 05/02/2018   Procedure: RIGHT BREAST SEED LUMPECTOMY X2 AND LEFT BREAST SEED GUIDED LUMPECTOMY;  Surgeon: Almond Lint, MD;  Location: [facility];  Service: General;  Laterality: Bilateral;   CATARACT EXTRACTION     right eye   HERNIA REPAIR     per [name], [name] is not aware of this surgery   RE-EXCISION OF BREAST LUMPECTOMY Right 05/25/2018   Procedure: RE-EXCISION OF RIGHT BREAST LUMPECTOMY;  Surgeon: Almond Lint, MD;  Location:  SURGERY CENTER;  Service: General;  Laterality: Right;   seba     TUBAL LIGATION     Social History:  reports that [name] has never smoked. [name] has never used smokeless tobacco. [name] reports that [name] does not drink alcohol and does not use drugs. Family History:  Family History  Problem Relation Age of Onset   Breast cancer Mother    Hypertension Mother    Diabetes Mother        type 2   Other Mother        esophagus surgery   Heart disease Father    Hyperlipidemia Father    Hypertension Father    Heart attack Father        MI at age 60   Diabetes Brother        type 2   Heart disease Maternal Grandmother    Heart attack Maternal Grandfather    Hypertension Maternal Grandfather    Stroke Paternal Grandmother    Hypertension Sister    Diabetes Sister    Hypertension Brother    Alcohol abuse Maternal Aunt    Cancer Maternal Aunt    Colon cancer Neg Hx    Stomach cancer Neg Hx    Esophageal cancer Neg Hx      HOME MEDICATIONS: Allergies as of 07/13/2023       Reactions   Pravastatin Other (See Comments)   Myalgia, fatigue, memory loss   Amoxicillin Itching    REACTION: rash   Atorvastatin Other (See Comments)   Myalgia, fatigue, memory loss   Codeine Nausea And Vomiting        Medication List        Accurate as of July 13, 2023 11:28 AM. If you have any questions, ask your nurse or doctor.          albuterol 108 (90 Base) MCG/ACT inhaler Commonly known as:  VENTOLIN HFA Inhale 2 puffs by mouth into Christine lungs every 6 (six) hours as needed for wheezing or shortness of breath.   Aspir-Low 81 MG tablet Generic drug: aspirin EC TAKE 1 TABLET BY MOUTH DAILY   bisoprolol-hydrochlorothiazide 5-6.25 MG tablet Commonly known as: ZIAC Take 1 tablet by mouth daily.   cetirizine 10 MG tablet Commonly known as: ZYRTEC Take 1 tablet (10 mg total) by mouth daily as needed for allergies or rhinitis.   famotidine 20 MG tablet Commonly known as: PEPCID Take 1 tablet (20 mg total) by mouth at bedtime.   fluticasone 50 MCG/ACT nasal spray Commonly known as: FLONASE Place 2 sprays into both nostrils daily.   FreeStyle Libre 3 Reader Pettit Use as directed to check blood glucose continuously.   FreeStyle Libre 3 Sensor Misc Inject 1 sensor into Christine skin every 14 (fourteen) days. Use to check blood glucose continuously.   Magnesium Oxide 400 MG Caps Take 1 capsule (400 mg total) by mouth daily.   metFORMIN 1000 MG tablet Commonly known as: GLUCOPHAGE Take 1 tablet (1,000 mg total) by mouth 2 (two) times daily with a meal.   omeprazole 40 MG capsule Commonly known as: PRILOSEC Take 1 capsule (40 mg total) by mouth 2 (two) times daily.   OneTouch Verio test strip Generic drug: glucose blood Use as directed to test blood sugar twice daily   Oscimin 0.125 MG SL tablet Generic drug: hyoscyamine Place 1 tablet (0.125 mg total) under Christine tongue every 4 (four) hours as needed.   Ozempic (0.25 or 0.5 MG/DOSE) 2 MG/3ML Sopn Generic drug: Semaglutide(0.25 or 0.5MG /DOS) Inject 0.5 mg into Christine skin once a week.   rosuvastatin 5 MG  tablet Commonly known as: CRESTOR Take 1 tablet (5 mg total) by mouth daily.         OBJECTIVE:   Vital Signs: BP 120/80 (BP Location: Left Arm, Benson Position: Sitting, Cuff Size: Normal)   Pulse 79   Ht 5\' 2"  (1.575 m)   Wt 180 lb (81.6 kg)   SpO2 99%   BMI 32.92 kg/m      Exam: General: [name] appears well and is in NAD  Lungs: Clear with good BS bilat  Heart: RRR   Abdomen: soft, nontender  Extremities: No pretibial edema.   Neuro: MS is good with appropriate affect, [name] is alert and Ox3       DM foot exam 07/13/2023  Christine skin of Christine feet is intact without sores or ulcerations. Christine pedal pulses are 2+ on right and 2+ on left. Christine sensation is intact to a screening 5.07, 10 gram monofilament bilaterally  DATA REVIEWED:  Lab Results  Component Value Date   HGBA1C 7.4 (H) 06/28/2023   HGBA1C 7.0 (H) 02/15/2023   HGBA1C 7.1 (H) 11/15/2022    Latest Reference Range & Units 06/28/23 12:01  Sodium 135 - 145 mEq/L 140  Potassium 3.5 - 5.1 mEq/L 4.3  Chloride 96 - 112 mEq/L 101  CO2 19 - 32 mEq/L 28  Glucose 70 - 99 mg/dL 93  BUN 6 - 23 mg/dL 12  Creatinine 1.61 - 0.96 mg/dL 0.45  Calcium 8.4 - 40.9 mg/dL 9.9  Alkaline Phosphatase 39 - 117 U/L 87  Albumin 3.5 - 5.2 g/dL 4.3  AST 0 - 37 U/L 25  ALT 0 - 35 U/L 37 (H)  Total Protein 6.0 - 8.3 g/dL 7.2  Total Bilirubin 0.2 - 1.2 mg/dL 0.4  GFR >81.19 mL/min 58.36 (L)  Latest Reference Range & Units 06/28/23 12:01  Total CHOL/HDL Ratio  3  Cholesterol 0 - 200 mg/dL 161  HDL Cholesterol >09.60 mg/dL 45.40  LDL (calc) 0 - 99 mg/dL 59  MICROALB/CREAT RATIO 0.0 - 30.0 mg/g 0.8  NonHDL  84.31  Triglycerides 0.0 - 149.0 mg/dL 981.1  VLDL 0.0 - 91.4 mg/dL 78.2    Latest Reference Range & Units 06/28/23 12:01  Creatinine,U mg/dL 95.6  Microalb, Ur 0.0 - 1.9 mg/dL <2.1  MICROALB/CREAT RATIO 0.0 - 30.0 mg/g 0.8     ASSESSMENT / PLAN / RECOMMENDATIONS:   1) Type 2 Diabetes Mellitus, Sub- Optimally  Controlled, Without complications - Most recent A1c of 7.4 %. Goal A1c < 7.0 %.     -A1c has trended up from 7.1% to 7.4% -[name] does have multiple GI issue that is attributed to Ozempic, [name] has been getting Ozempic through Benson assistance. -We have discussed discontinuing Ozempic -I have recommended starting Jardiance, [name] will be provided with Benson assistance forms -We may consider Mounjaro in Christine future, but I did explain to [name] that there is no Benson assistance for Mounjaro at this time -Cautioned against genital infections with Jardiance -[name] has been noted with hypoglycemic readings on freestyle libre, I have asked [name] to use a fingerstick and confirm those episodes, as they may be false readings.   MEDICATIONS:  -Stop Ozempic   0.5 mg weekly -Start Jardiance 10 mg daily  -Continue metformin 1000 mg twice daily   EDUCATION / INSTRUCTIONS: BG monitoring instructions: Benson is instructed to check her blood sugars 2 times a day, fasting and supper time. Call  Endocrinology clinic if: BG persistently < 70  I reviewed Christine Rule of 15 for Christine treatment of hypoglycemia in detail with [name]. Literature supplied.    2) Diabetic complications:  Eye: Does not have known diabetic retinopathy.  Neuro/ Feet: Does not have known diabetic peripheral neuropathy .  Renal: Benson does not have known baseline CKD. [name]   is not on an ACEI/ARB at present.  MA/CR.normal   3) Dyslipidemia:   -LDL at goal with most recent lipid panel through PCPs office -Continue atorvastatin 5 mg daily      F/U in 3 months   Signed electronically by: Lyndle Herrlich, MD  [facility] Endocrinology  [facility] [address] [place], [place] [address] Phone: [phone] FAX: [phone]   CC: Bradd Canary, MD [address] [place] [address] Phone: [phone]  Fax: [phone]  Return to  Endocrinology clinic as below: Future Appointments  Date Time Provider Department Center  07/13/2023 11:50 AM Arial Galligan, Konrad Dolores, MD [facility] None  11/14/2023  2:00 PM Bradd Canary, MD [facility]  02/09/2024  2:20 PM Bradd Canary, MD [facility]

## 2023-07-14 ENCOUNTER — Encounter: Payer: Self-pay | Admitting: Internal Medicine

## 2023-07-14 DIAGNOSIS — M25531 Pain in right wrist: Secondary | ICD-10-CM | POA: Diagnosis not present

## 2023-08-04 ENCOUNTER — Other Ambulatory Visit: Payer: Self-pay

## 2023-08-04 ENCOUNTER — Other Ambulatory Visit: Payer: Self-pay | Admitting: Physician Assistant

## 2023-08-04 ENCOUNTER — Other Ambulatory Visit (HOSPITAL_BASED_OUTPATIENT_CLINIC_OR_DEPARTMENT_OTHER): Payer: Self-pay

## 2023-08-04 ENCOUNTER — Other Ambulatory Visit: Payer: Self-pay | Admitting: Internal Medicine

## 2023-08-04 MED ORDER — ONETOUCH VERIO VI STRP
1.0000 | ORAL_STRIP | Freq: Two times a day (BID) | 11 refills | Status: AC
  Filled 2023-08-04: qty 100, 50d supply, fill #0
  Filled 2023-09-19: qty 100, 50d supply, fill #1
  Filled 2023-11-08: qty 100, 50d supply, fill #2
  Filled 2023-12-28: qty 100, 50d supply, fill #3
  Filled 2024-02-14: qty 100, 50d supply, fill #4
  Filled 2024-05-04: qty 100, 50d supply, fill #5
  Filled 2024-06-21: qty 100, 50d supply, fill #6

## 2023-08-04 MED ORDER — FAMOTIDINE 20 MG PO TABS
20.0000 mg | ORAL_TABLET | Freq: Every day | ORAL | 3 refills | Status: DC
  Filled 2023-08-04: qty 30, 30d supply, fill #0
  Filled 2023-08-30: qty 30, 30d supply, fill #1
  Filled 2023-10-02: qty 30, 30d supply, fill #2
  Filled 2023-10-31: qty 30, 30d supply, fill #3

## 2023-08-05 ENCOUNTER — Other Ambulatory Visit (HOSPITAL_BASED_OUTPATIENT_CLINIC_OR_DEPARTMENT_OTHER): Payer: Self-pay

## 2023-08-11 ENCOUNTER — Other Ambulatory Visit (HOSPITAL_BASED_OUTPATIENT_CLINIC_OR_DEPARTMENT_OTHER): Payer: Self-pay

## 2023-08-11 ENCOUNTER — Other Ambulatory Visit: Payer: Self-pay

## 2023-08-16 DIAGNOSIS — M25531 Pain in right wrist: Secondary | ICD-10-CM | POA: Diagnosis not present

## 2023-08-29 ENCOUNTER — Other Ambulatory Visit: Payer: Self-pay | Admitting: Family Medicine

## 2023-08-29 ENCOUNTER — Other Ambulatory Visit: Payer: Self-pay | Admitting: Physician Assistant

## 2023-08-29 DIAGNOSIS — J069 Acute upper respiratory infection, unspecified: Secondary | ICD-10-CM

## 2023-08-30 ENCOUNTER — Other Ambulatory Visit (HOSPITAL_BASED_OUTPATIENT_CLINIC_OR_DEPARTMENT_OTHER): Payer: Self-pay

## 2023-08-30 ENCOUNTER — Other Ambulatory Visit: Payer: Self-pay

## 2023-08-30 MED ORDER — OMEPRAZOLE 40 MG PO CPDR
40.0000 mg | DELAYED_RELEASE_CAPSULE | Freq: Two times a day (BID) | ORAL | 0 refills | Status: DC
  Filled 2023-08-30: qty 180, 90d supply, fill #0

## 2023-08-30 MED ORDER — FLUTICASONE PROPIONATE 50 MCG/ACT NA SUSP
2.0000 | Freq: Every day | NASAL | 5 refills | Status: AC
  Filled 2023-08-30: qty 16, 30d supply, fill #0
  Filled 2023-10-02: qty 16, 30d supply, fill #1
  Filled 2023-10-31: qty 16, 30d supply, fill #2
  Filled 2024-01-30: qty 16, 30d supply, fill #3
  Filled 2024-06-21: qty 16, 30d supply, fill #4

## 2023-09-08 ENCOUNTER — Encounter: Payer: Self-pay | Admitting: Emergency Medicine

## 2023-09-14 ENCOUNTER — Other Ambulatory Visit: Payer: Self-pay | Admitting: Family Medicine

## 2023-09-15 ENCOUNTER — Other Ambulatory Visit (HOSPITAL_BASED_OUTPATIENT_CLINIC_OR_DEPARTMENT_OTHER): Payer: Self-pay

## 2023-09-15 MED ORDER — BISOPROLOL-HYDROCHLOROTHIAZIDE 5-6.25 MG PO TABS
1.0000 | ORAL_TABLET | Freq: Every day | ORAL | 1 refills | Status: DC
  Filled 2023-09-15: qty 90, 90d supply, fill #0
  Filled 2023-12-15: qty 90, 90d supply, fill #1

## 2023-09-19 ENCOUNTER — Other Ambulatory Visit: Payer: Self-pay

## 2023-10-02 ENCOUNTER — Other Ambulatory Visit: Payer: Self-pay | Admitting: Family Medicine

## 2023-10-03 ENCOUNTER — Other Ambulatory Visit (HOSPITAL_BASED_OUTPATIENT_CLINIC_OR_DEPARTMENT_OTHER): Payer: Self-pay

## 2023-10-03 ENCOUNTER — Other Ambulatory Visit: Payer: Self-pay

## 2023-10-03 MED ORDER — FREESTYLE LIBRE 3 SENSOR MISC
1.0000 | 3 refills | Status: DC
  Filled 2023-10-03: qty 2, 28d supply, fill #0
  Filled 2023-10-31: qty 2, 28d supply, fill #1
  Filled 2023-11-29: qty 2, 28d supply, fill #2
  Filled 2023-12-28: qty 2, 28d supply, fill #3

## 2023-10-10 ENCOUNTER — Encounter: Payer: Self-pay | Admitting: Pharmacist

## 2023-10-10 ENCOUNTER — Other Ambulatory Visit (HOSPITAL_BASED_OUTPATIENT_CLINIC_OR_DEPARTMENT_OTHER): Payer: Self-pay

## 2023-10-10 NOTE — Progress Notes (Signed)
 10/10/2023 Name: [name] MRN: [id number] DOB: 1950-07-01  Chief Complaint  Patient presents with   Medication Problem    [name] is a 73 y.o. year old female who presented for a telephone visit.   They were referred to the pharmacist by their PCP for assistance in managing diabetes and medication access.    Subjective:  Care Team: Primary Care Provider: Neda Balk, MD ; Next Scheduled Visit: 11/14/2023 Endocrinologist Dr Elease Grice; Next Scheduled Visit: not currently scheduled but per patient she is suppose to follow up with Dr Rosalea Collin sometime in either or July 2025.   Medication Access/Adherence  Current Pharmacy:  [facility] [place] [facility] [address] [place] [address] Phone: [phone] Fax: [phone]  [facility] [address] [place] [address] Phone: [phone] Fax: [phone]  [facility] [address] [place] [address] Phone: [phone] Fax: [phone]   Patient reports affordability concerns with their medications: No  Patient reports access/transportation concerns to their pharmacy: No  Patient reports adherence concerns with their medications:  No      Diabetes:  Current medications: Jardiance  10mg  daily and metformin  1000mg  twice a day  Medications tried in the past: Ozempic  0.5mg  weekly - stopped due to GI side effects.   Previously was getting Ozempic  from medication assistance program for patient but noted that she had not picked up her delivery from the office. Patient reports that she has stopped Ozempic .   Patient notes that her blood glucose been higher recently - 150's-180's  Patient denies hypoglycemic s/sx including no dizziness, shakiness, sweating. Patient denies hyperglycemic symptoms including no polyuria, polydipsia, polyphagia, nocturia, neuropathy,  blurred vision.  Approved for the Novo Nordisk patient assistance program thru 06/13/2024   Objective:  Lab Results  Component Value Date   HGBA1C 7.4 (H) 06/28/2023    Lab Results  Component Value Date   CREATININE 0.98 06/28/2023   BUN 12 06/28/2023   NA 140 06/28/2023   K 4.3 06/28/2023   CL 101 06/28/2023   CO2 28 06/28/2023    Lab Results  Component Value Date   CHOL 128 06/28/2023   HDL 43.50 06/28/2023   LDLCALC 59 06/28/2023   LDLDIRECT 96.0 08/19/2020   TRIG 129.0 06/28/2023   CHOLHDL 3 06/28/2023    Medications Reviewed Today     Reviewed by Cecilie Coffee, RPH-CPP (Pharmacist) on 10/10/23 at 1341  Med List Status: <None>   Medication Order Taking? Sig Documenting Provider Last Dose Status Informant  albuterol  (VENTOLIN  HFA) 108 (90 Base) MCG/ACT inhaler [id number] No Inhale 2 puffs by mouth into the lungs every 6 (six) hours as needed for wheezing or shortness of breath. Neda Balk, MD Taking Active   ASPIR-LOW 81 MG EC tablet [id number] No TAKE 1 TABLET BY MOUTH DAILY Neda Balk, MD Taking Active   bisoprolol -hydrochlorothiazide  (ZIAC ) 5-6.25 MG tablet [id number]  Take 1 tablet by mouth daily. Neda Balk, MD  Active   cetirizine  (ZYRTEC ) 10 MG tablet [id number] No Take 1 tablet (10 mg total) by mouth daily as needed for allergies or rhinitis. Neda Balk, MD Taking Active   Continuous Glucose Receiver (FREESTYLE LIBRE 3 READER) DEVI [id number] No Use as directed to check blood glucose continuously. Neda Balk, MD Taking Active   Continuous Glucose Sensor (FREESTYLE LIBRE 3 SENSOR) [place] [id number]  Inject 1 sensor into the skin every  14 (fourteen) days. Use to check blood glucose continuously. Neda Balk, MD  Active   empagliflozin  (JARDIANCE ) 10 MG TABS tablet [id number]  Take 1 tablet (10 mg total) by mouth daily. Shamleffer, Ibtehal Jaralla, MD  Active   famotidine  (PEPCID ) 20 MG tablet [id number]  Take 1 tablet (20 mg total) by mouth at  bedtime. Esterwood, Amy S, PA-C  Active   fluticasone  (FLONASE ) 50 MCG/ACT nasal spray [id number]  Place 2 sprays into both nostrils daily. Neda Balk, MD  Active   glucose blood [facility] VERIO) test strip [id number]  Use as directed to test blood sugar 2 (two) times daily. Shamleffer, Julian Obey, MD  Active   hyoscyamine  (LEVSIN SL) 0.125 MG SL tablet [id number] No Place 1 tablet (0.125 mg total) under the tongue every 4 (four) hours as needed. Neda Balk, MD Taking Active   Magnesium  Oxide 400 MG CAPS [id number] No Take 1 capsule (400 mg total) by mouth daily. Jacqueline Matsu, MD Taking Active   metFORMIN  (GLUCOPHAGE ) 1000 MG tablet [id number]  Take 1 tablet (1,000 mg total) by mouth 2 (two) times daily with a meal. Shamleffer, Julian Obey, MD  Active   omeprazole  (PRILOSEC) 40 MG capsule [id number]  Take 1 capsule (40 mg total) by mouth 2 (two) times daily. Esterwood, Amy S, PA-C  Active   rosuvastatin  (CRESTOR ) 5 MG tablet [id number]  Take 1 tablet (5 mg total) by mouth daily. Shamleffer, Julian Obey, MD  Active               Assessment/Plan:   Diabetes: Per patient home blood glucose has not been at goal  - Reviewed goal A1c, goal fasting, and goal 2 hour post prandial glucose - Recommend to she reach out to Dr [facility] office to provide recent home blood glucose readings and to make follow up appointment - Discussed her 2025 HTA plan for Heart and Diabetes means that any medications for Diabetes and CVD is on her formulary would be $0 copay and that there is no coverage gap in 2025. As mentioned by Dr Shamleffer Mounjaro might be an alternative to Ozempic  with fewer GI side effect and cost would be $0 to her.  - Continue metformin  and Jardiance  .   Cecilie Coffee, PharmD Clinical Pharmacist [facility]

## 2023-10-25 ENCOUNTER — Ambulatory Visit (INDEPENDENT_AMBULATORY_CARE_PROVIDER_SITE_OTHER)

## 2023-10-25 VITALS — Ht 62.5 in | Wt 180.0 lb

## 2023-10-25 DIAGNOSIS — Z1211 Encounter for screening for malignant neoplasm of colon: Secondary | ICD-10-CM

## 2023-10-25 DIAGNOSIS — Z0000 Encounter for general adult medical examination without abnormal findings: Secondary | ICD-10-CM

## 2023-10-25 NOTE — Progress Notes (Signed)
 Subjective:   [name] is a 73 y.o. who presents for a Medicare Wellness preventive visit.  As a reminder, Annual Wellness Visits don't include a physical exam, and some assessments may be limited, especially if this visit is performed virtually. We may recommend an in-person visit if needed.  Visit Complete: Virtual I connected with  Fianna Abdellatif Leugers on 10/25/23 by a audio enabled telemedicine application and verified that I am speaking with the correct person using two identifiers.  Patient Location: Home  Provider Location: Home Office  I discussed the limitations of evaluation and management by telemedicine. The patient expressed understanding and agreed to proceed.  Vital Signs: Because this visit was a virtual/telehealth visit, some criteria may be missing or patient reported. Any vitals not documented were not able to be obtained and vitals that have been documented are patient reported.    Persons Participating in Visit: Patient.  AWV Questionnaire: No: Patient Medicare AWV questionnaire was not completed prior to this visit.  Cardiac Risk Factors include: advanced age (>69men, >16 women);diabetes mellitus;hypertension     Objective:     Today's Vitals   10/25/23 1134  Weight: 180 lb (81.6 kg)  Height: 5' 2.5" (1.588 m)   Body mass index is 32.4 kg/m.     10/25/2023   11:41 AM 03/26/2023    7:42 AM 10/15/2022    9:42 AM 10/13/2021    9:42 AM 08/27/2021    4:43 PM 01/15/2021    4:20 PM 01/09/2020    1:51 PM  Advanced Directives  Does Patient Have a Medical Advance Directive? Yes No Yes Yes Yes No Yes  Type of Estate agent of [place];Living will  Healthcare Power of [place];Living will Healthcare Power of [place];Out of facility DNR (pink MOST or yellow form);Living will Healthcare Power of [place];Living will  Healthcare Power of [place];Living will  Does patient want to make changes to medical advance directive?     No - Patient  declined  No - Patient declined  Copy of Healthcare Power of Attorney in Chart? No - copy requested  No - copy requested No - copy requested No - copy requested    Would patient like information on creating a medical advance directive?  No - Patient declined    No - Patient declined     Current Medications (verified) Outpatient Encounter Medications as of 10/25/2023  Medication Sig   albuterol  (VENTOLIN  HFA) 108 (90 Base) MCG/ACT inhaler Inhale 2 puffs by mouth into the lungs every 6 (six) hours as needed for wheezing or shortness of breath.   ASPIR-LOW 81 MG EC tablet TAKE 1 TABLET BY MOUTH DAILY   bisoprolol -hydrochlorothiazide  (ZIAC ) 5-6.25 MG tablet Take 1 tablet by mouth daily.   cetirizine  (ZYRTEC ) 10 MG tablet Take 1 tablet (10 mg total) by mouth daily as needed for allergies or rhinitis.   Continuous Glucose Receiver (FREESTYLE LIBRE 3 READER) DEVI Use as directed to check blood glucose continuously.   Continuous Glucose Sensor (FREESTYLE LIBRE 3 SENSOR) MISC Inject 1 sensor into the skin every 14 (fourteen) days. Use to check blood glucose continuously.   empagliflozin  (JARDIANCE ) 10 MG TABS tablet Take 1 tablet (10 mg total) by mouth daily.   famotidine  (PEPCID ) 20 MG tablet Take 1 tablet (20 mg total) by mouth at bedtime.   fluticasone  (FLONASE ) 50 MCG/ACT nasal spray Place 2 sprays into both nostrils daily.   glucose blood (ONETOUCH VERIO) test strip Use as directed to test blood sugar 2 (  two) times daily.   hyoscyamine  (LEVSIN SL) 0.125 MG SL tablet Place 1 tablet (0.125 mg total) under the tongue every 4 (four) hours as needed.   Magnesium  Oxide 400 MG CAPS Take 1 capsule (400 mg total) by mouth daily.   metFORMIN  (GLUCOPHAGE ) 1000 MG tablet Take 1 tablet (1,000 mg total) by mouth 2 (two) times daily with a meal.   omeprazole  (PRILOSEC) 40 MG capsule Take 1 capsule (40 mg total) by mouth 2 (two) times daily.   rosuvastatin  (CRESTOR ) 5 MG tablet Take 1 tablet (5 mg total) by mouth  daily.   Facility-Administered Encounter Medications as of 10/25/2023  Medication   Chlorhexidine  Gluconate Cloth 2 % PADS 6 each   And   Chlorhexidine  Gluconate Cloth 2 % PADS 6 each    Allergies (verified) Pravastatin , Amoxicillin, Atorvastatin , and Codeine   History: Past Medical History:  Diagnosis Date   Acute bronchitis 10/31/2015   Allergy    seasonal   Arthritis    knee-left   Cancer (HCC) 04/2018   right breast ca   Cataract    right eye   Diabetes mellitus 52   type 2   Diaphragmatic hernia without mention of obstruction or gangrene    Endometriosis    Esophageal reflux    Esophageal stricture    GERD (gastroesophageal reflux disease)    HOCM (hypertrophic obstructive cardiomyopathy) (HCC)    Basal septal hypertrophy 16 mm with increased ECV at 30% on cardiac MRI 06/2021 consistent with HOCM   Hyperlipidemia    Hyperplastic colon polyp    Hypertension 52   Nonalcoholic fatty liver disease 04/28/2010   Qualifier: Diagnosis of  By: Larrie Po MD, John E    Nonspecific abnormal results of liver function study    NSVT (nonsustained ventricular tachycardia) (HCC)    noted on event monitor 05/2021   Personal history of radiation therapy 08/2018   bilateral breasts   Pharyngitis 02/18/2012   Plantar fascial fibromatosis    Preventative health care 01/22/2013   Seasonal allergies 10/31/2015   Sleep apnea    has oral appliance but does not fit well due to missing teeth   Sore throat 04/03/2017   Tubular adenoma of colon    Unspecified sleep apnea    no c-pap   Past Surgical History:  Procedure Laterality Date   ABDOMINAL HYSTERECTOMY  1982   partial   APPENDECTOMY     benigh cyst in lung  2006   right   BREAST BIOPSY Bilateral 04/03/2018   BREAST BIOPSY Bilateral 03/23/2018   BREAST CYST INCISION AND DRAINAGE     right breast   BREAST EXCISIONAL BIOPSY Right 2019   BREAST LUMPECTOMY Bilateral 04/2018   BREAST LUMPECTOMY WITH RADIOACTIVE SEED LOCALIZATION  Bilateral 05/02/2018   Procedure: RIGHT BREAST SEED LUMPECTOMY X2 AND LEFT BREAST SEED GUIDED LUMPECTOMY;  Surgeon: Lockie Rima, MD;  Location: [facility];  Service: General;  Laterality: Bilateral;   CATARACT EXTRACTION     right eye   HERNIA REPAIR     per pt, she is not aware of this surgery   RE-EXCISION OF BREAST LUMPECTOMY Right 05/25/2018   Procedure: RE-EXCISION OF RIGHT BREAST LUMPECTOMY;  Surgeon: Lockie Rima, MD;  Location: [facility];  Service: General;  Laterality: Right;   seba     TUBAL LIGATION     Family History  Problem Relation Age of Onset   Breast cancer Mother    Hypertension Mother    Diabetes Mother  type 2   Other Mother        esophagus surgery   Heart disease Father    Hyperlipidemia Father    Hypertension Father    Heart attack Father        MI at age 84   Diabetes Brother        type 2   Heart disease Maternal Grandmother    Heart attack Maternal Grandfather    Hypertension Maternal Grandfather    Stroke Paternal Grandmother    Hypertension Sister    Diabetes Sister    Hypertension Brother    Alcohol abuse Maternal Aunt    Cancer Maternal Aunt    Colon cancer Neg Hx    Stomach cancer Neg Hx    Esophageal cancer Neg Hx    Social History   Socioeconomic History   Marital status: Married    Spouse name: Financial trader   Number of children: 2   Years of education: Not on file   Highest education level: GED or equivalent  Occupational History   Occupation: Insurance risk surveyor: [employer]  Tobacco Use   Smoking status: Never   Smokeless tobacco: Never  Substance and Sexual Activity   Alcohol use: No    Alcohol/week: 0.0 standard drinks of alcohol   Drug use: No   Sexual activity: Yes    Birth control/protection: Post-menopausal    Comment: lives with husband, no dietary restrictions just watching carbs, wears seat belt  Other Topics Concern   Not on file  Social History Narrative   Not on  file   Social Drivers of Health   Financial Resource Strain: Low Risk  (10/25/2023)   Overall Financial Resource Strain (CARDIA)    Difficulty of Paying Living Expenses: Not hard at all  Food Insecurity: No Food Insecurity (10/25/2023)   Hunger Vital Sign    Worried About Running Out of Food in the Last Year: Never true    Ran Out of Food in the Last Year: Never true  Transportation Needs: No Transportation Needs (10/25/2023)   PRAPARE - Administrator, Civil Service (Medical): No    Lack of Transportation (Non-Medical): No  Physical Activity: Inactive (10/25/2023)   Exercise Vital Sign    Days of Exercise per Week: 0 days    Minutes of Exercise per Session: 0 min  Stress: No Stress Concern Present (10/25/2023)   [employer] of Occupational Health - Occupational Stress Questionnaire    Feeling of Stress : Not at all  Social Connections: Socially Integrated (10/25/2023)   Social Connection and Isolation Panel [NHANES]    Frequency of Communication with Friends and Family: More than three times a week    Frequency of Social Gatherings with Friends and Family: More than three times a week    Attends Religious Services: More than 4 times per year    Active Member of [employer] or Organizations: Yes    Attends Engineer, structural: More than 4 times per year    Marital Status: Married    Tobacco Counseling Counseling given: Not Answered    Clinical Intake:  Pre-visit preparation completed: Yes  Pain : No/denies pain     BMI - recorded: 32.4 Nutritional Status: BMI > 30  Obese Nutritional Risks: None Diabetes: Yes CBG done?: Yes (CBG 130 Per patient) CBG resulted in Enter/ Edit results?: Yes Did pt. bring in CBG monitor from home?: No  Lab Results  Component Value Date   HGBA1C 7.4 (  H) 06/28/2023   HGBA1C 7.0 (H) 02/15/2023   HGBA1C 7.1 (H) 11/15/2022     How often do you need to have someone help you when you read instructions, pamphlets, or other  written materials from your doctor or pharmacy?: 1 - Never  Interpreter Needed?: No  Information entered by :: Farris Hong LPN   Activities of Daily Living     10/25/2023   11:40 AM  In your present state of health, do you have any difficulty performing the following activities:  Hearing? 0  Vision? 0  Difficulty concentrating or making decisions? 0  Walking or climbing stairs? 0  Dressing or bathing? 0  Doing errands, shopping? 0  Preparing Food and eating ? N  Using the Toilet? N  In the past six months, have you accidently leaked urine? N  Do you have problems with loss of bowel control? N  Managing your Medications? N  Managing your Finances? N  Housekeeping or managing your Housekeeping? N    Patient Care Team: Neda Balk, MD as PCP - General (Family Medicine) Jacqueline Matsu, MD as PCP - Cardiology (Cardiology) Boyce Byes, MD as PCP - Electrophysiology (Cardiology) Cindra Cree, MD as Consulting Physician (Ophthalmology) Finis Hugger Kendrick Pax, MD as Consulting Physician (Obstetrics and Gynecology) Lockie Rima, MD as Consulting Physician (General Surgery) Magrinat, Rozella Cornfield, MD (Inactive) as Consulting Physician (Oncology) Salina Craver, MD as Consulting Physician (Sports Medicine) [facility], Julian Obey, MD as Consulting Physician (Endocrinology) [facility], [facility]  Indicate any recent Medical Services you may have received from other than [facility] providers in the past year (date may be approximate).     Assessment:    This is a routine wellness examination for [facility].  Hearing/Vision screen Hearing Screening - Comments:: Denies hearing difficulties   Vision Screening - Comments:: Wears rx glasses - up to date with routine eye exams with  Dr Ambrosio Junker   Goals Addressed               This Visit's Progress     Increase physical activity (pt-stated)        Remain active.       Depression Screen     10/25/2023    11:39 AM 02/15/2023   11:18 AM 11/15/2022   10:52 AM 10/15/2022    9:48 AM 08/06/2022    9:52 AM 01/28/2022   10:54 AM 10/13/2021    9:43 AM  PHQ 2/9 Scores  PHQ - 2 Score 0 0 0 1 0 0 1  PHQ- 9 Score   0   0     Fall Risk     10/25/2023   11:40 AM 02/15/2023   11:18 AM 11/15/2022   10:52 AM 10/15/2022    9:44 AM 08/06/2022    9:52 AM  Fall Risk   Falls in the past year? 0 0 0 0 0  Number falls in past yr: 0 0 0 0 0  Injury with Fall? 0  0 0 0  Risk for fall due to : No Fall Risks   No Fall Risks No Fall Risks  Follow up Falls prevention discussed;Falls evaluation completed Falls evaluation completed Falls evaluation completed Falls evaluation completed Falls evaluation completed    MEDICARE RISK AT HOME:  Medicare Risk at Home Any stairs in or around the home?: Yes If so, are there any without handrails?: No Home free of loose throw rugs in walkways, pet beds, electrical cords, etc?: Yes Adequate lighting in your  home to reduce risk of falls?: Yes Life alert?: No Use of a cane, walker or w/c?: No Grab bars in the bathroom?: No Shower chair or bench in shower?: No Elevated toilet seat or a handicapped toilet?: No  TIMED UP AND GO:  Was the test performed?  No  Cognitive Function: 6CIT completed        10/25/2023   11:41 AM 10/15/2022    9:51 AM 10/13/2021    9:47 AM  6CIT Screen  What Year? 0 points 0 points 0 points  What month? 0 points 0 points 0 points  What time? 0 points 0 points 0 points  Count back from 20 0 points 0 points 0 points  Months in reverse 0 points 0 points 0 points  Repeat phrase 0 points 0 points 0 points  Total Score 0 points 0 points 0 points    Immunizations Immunization History  Administered Date(s) Administered   Fluad Quad(high Dose 65+) 03/05/2019, 06/02/2022   Fluad Trivalent(High Dose 65+) 06/28/2023   Influenza Split 03/22/2011, 02/17/2012   Influenza Whole 03/22/2007, 03/19/2008, 04/28/2010   Influenza,inj,Quad PF,6+ Mos 02/26/2013,  03/04/2014, 04/01/2017   Influenza-Unspecified 04/02/2018   PFIZER Comirnaty(Gray Top)Covid-19 Tri-Sucrose Vaccine 11/24/2020   PFIZER(Purple Top)SARS-COV-2 Vaccination 08/13/2019, 09/11/2019, 04/08/2020   PNEUMOCOCCAL CONJUGATE-20 06/02/2022   Pneumococcal Conjugate-13 05/28/2013, 01/29/2021   Pneumococcal Polysaccharide-23 09/04/2015   Td 10/12/2001   Tdap 01/03/2012, 05/09/2018    Screening Tests Health Maintenance  Topic Date Due   Zoster Vaccines- Shingrix (1 of 2) Never done   Colonoscopy  01/02/2024   COVID-19 Vaccine (5 - 2024-25 season) 03/28/2024 (Originally 02/13/2023)   HEMOGLOBIN A1C  12/26/2023   INFLUENZA VACCINE  01/13/2024   OPHTHALMOLOGY EXAM  01/14/2024   Diabetic kidney evaluation - eGFR measurement  06/27/2024   Diabetic kidney evaluation - Urine ACR  06/27/2024   FOOT EXAM  07/12/2024   Medicare Annual Wellness (AWV)  10/24/2024   MAMMOGRAM  05/08/2025   DTaP/Tdap/Td (4 - Td or Tdap) 05/09/2028   Pneumonia Vaccine 19+ Years old  Completed   DEXA SCAN  Completed   Hepatitis C Screening  Completed   HPV VACCINES  Aged Out   Meningococcal B Vaccine  Aged Out    Health Maintenance  Health Maintenance Due  Topic Date Due   Zoster Vaccines- Shingrix (1 of 2) Never done   Colonoscopy  01/02/2024   Health Maintenance Items Addressed: Referral sent to GI for colonoscopy  Additional Screening:  Vision Screening: Recommended annual ophthalmology exams for early detection of glaucoma and other disorders of the eye.  Dental Screening: Recommended annual dental exams for proper oral hygiene  Community Resource Referral / Chronic Care Management: CRR required this visit?  No   CCM required this visit?  No   Plan:    I have personally reviewed and noted the following in the patient's chart:   Medical and social history Use of alcohol, tobacco or illicit drugs  Current medications and supplements including opioid prescriptions. Patient is not currently  taking opioid prescriptions. Functional ability and status Nutritional status Physical activity Advanced directives List of other physicians Hospitalizations, surgeries, and ER visits in previous 12 months Vitals Screenings to include cognitive, depression, and falls Referrals and appointments  In addition, I have reviewed and discussed with patient certain preventive protocols, quality metrics, and best practice recommendations. A written personalized care plan for preventive services as well as general preventive health recommendations were provided to patient.   Dewayne Ford, LPN  10/25/2023   After Visit Summary: (MyChart) Due to this being a telephonic visit, the after visit summary with patients personalized plan was offered to patient via MyChart   Notes: Nothing significant to report at this time.

## 2023-10-25 NOTE — Patient Instructions (Addendum)
 [name] , Thank you for taking time out of your busy schedule to complete your Annual Wellness Visit with me. I enjoyed our conversation and look forward to speaking with you again next year. I, as well as your care team,  appreciate your ongoing commitment to your health goals. Please review the following plan we discussed and let me know if I can assist you in the future. Your Game plan/ To Do List    Referrals: If you haven't heard from the office you've been referred to, please reach out to them at the phone provided.   Follow up Visits: Next Medicare AWV with our clinical staff: 11/01/24 @ 11:30a   Have you seen your provider in the last 6 months (3 months if uncontrolled diabetes)? No Next Office Visit with your provider: 11/14/23 @ 2p  Clinician Recommendations:  Aim for 30 minutes of exercise or brisk walking, 6-8 glasses of water, and 5 servings of fruits and vegetables each day.       This is a list of the screening recommended for you and due dates:  Health Maintenance  Topic Date Due   Zoster (Shingles) Vaccine (1 of 2) Never done   Colon Cancer Screening  01/02/2024   COVID-19 Vaccine (5 - 2024-25 season) 03/28/2024*   Hemoglobin A1C  12/26/2023   Flu Shot  01/13/2024   Eye exam for diabetics  01/14/2024   Yearly kidney function blood test for diabetes  06/27/2024   Yearly kidney health urinalysis for diabetes  06/27/2024   Complete foot exam   07/12/2024   Medicare Annual Wellness Visit  10/24/2024   Mammogram  05/08/2025   DTaP/Tdap/Td vaccine (4 - Td or Tdap) 05/09/2028   Pneumonia Vaccine  Completed   DEXA scan (bone density measurement)  Completed   Hepatitis C Screening  Completed   HPV Vaccine  Aged Out   Meningitis B Vaccine  Aged Out  *Topic was postponed. The date shown is not the original due date.    Advanced directives: (Copy Requested) Please bring a copy of your health care power of attorney and living will to the office to be added to your chart at  your convenience. You can mail to [facility] [address] [place], [place] [address] or email to [email or web address] Advance Care Planning is important because it:  [x]  Makes sure you receive the medical care that is consistent with your values, goals, and preferences  [x]  It provides guidance to your family and loved ones and reduces their decisional burden about whether or not they are making the right decisions based on your wishes.  Follow the link provided in your after visit summary or read over the paperwork we have mailed to you to help you started getting your Advance Directives in place. If you need assistance in completing these, please reach out to us  so that we can help you!  See attachments for Preventive Care and Fall Prevention Tips.

## 2023-11-13 NOTE — Assessment & Plan Note (Signed)
 Well controlled, no changes to meds. Encouraged heart healthy diet such as the DASH diet and exercise as tolerated.

## 2023-11-13 NOTE — Assessment & Plan Note (Signed)
 hgba1c acceptable, minimize simple carbs. Increase exercise as tolerated. Continue current meds

## 2023-11-13 NOTE — Assessment & Plan Note (Signed)
 Tolerating statin, encouraged heart healthy diet, avoid trans fats, minimize simple carbs and saturated fats. Increase exercise as tolerated

## 2023-11-14 ENCOUNTER — Other Ambulatory Visit (HOSPITAL_BASED_OUTPATIENT_CLINIC_OR_DEPARTMENT_OTHER): Payer: Self-pay

## 2023-11-14 ENCOUNTER — Ambulatory Visit (INDEPENDENT_AMBULATORY_CARE_PROVIDER_SITE_OTHER): Payer: PPO | Admitting: Family Medicine

## 2023-11-14 ENCOUNTER — Encounter: Payer: Self-pay | Admitting: Family Medicine

## 2023-11-14 VITALS — BP 126/78 | HR 85 | Resp 16 | Ht 62.5 in | Wt 175.8 lb

## 2023-11-14 DIAGNOSIS — K635 Polyp of colon: Secondary | ICD-10-CM | POA: Diagnosis not present

## 2023-11-14 DIAGNOSIS — L989 Disorder of the skin and subcutaneous tissue, unspecified: Secondary | ICD-10-CM

## 2023-11-14 DIAGNOSIS — E1165 Type 2 diabetes mellitus with hyperglycemia: Secondary | ICD-10-CM

## 2023-11-14 DIAGNOSIS — R748 Abnormal levels of other serum enzymes: Secondary | ICD-10-CM

## 2023-11-14 DIAGNOSIS — I10 Essential (primary) hypertension: Secondary | ICD-10-CM | POA: Diagnosis not present

## 2023-11-14 DIAGNOSIS — E782 Mixed hyperlipidemia: Secondary | ICD-10-CM | POA: Diagnosis not present

## 2023-11-14 DIAGNOSIS — L578 Other skin changes due to chronic exposure to nonionizing radiation: Secondary | ICD-10-CM

## 2023-11-14 DIAGNOSIS — E119 Type 2 diabetes mellitus without complications: Secondary | ICD-10-CM

## 2023-11-14 MED ORDER — AZELASTINE HCL 0.1 % NA SOLN
1.0000 | Freq: Two times a day (BID) | NASAL | 2 refills | Status: AC | PRN
  Filled 2023-11-14: qty 30, 50d supply, fill #0

## 2023-11-14 NOTE — Patient Instructions (Addendum)
 Dix-Halpike maneuver for vertigo  Call Endocrinology for f/u appointment  Shingrix is the new shingles shot, 2 shots over 2-6 months, confirm coverage with insurance and document, then can return here for shots with nurse appt or at pharmacy   RSV, Respiratory Syncitial Virus Vaccine, Arexvy at pharmacy  Vertigo Vertigo is the feeling that you or the things around you are moving or spinning when they're not. It's different than feeling dizzy. It can also cause: Loss of balance. Trouble standing or walking. Nausea and vomiting. This feeling can come and go at any time. It can last from a few seconds to minutes or even hours. It may go away on its own or be treated with medicine. What are the types of vertigo? There are two types of vertigo: Peripheral vertigo happens when parts of your inner ear don't work like they should. This is the more common type. Central vertigo happens when your brain and spinal cord don't work like they should. Your health care provider will do tests to find out what kind of vertigo you have. This will help them decide on the right treatment for you. Follow these instructions at home: Eating and drinking Drink enough fluid to keep your pee (urine) pale yellow. Do not drink alcohol. Activity When you get up in the morning, first sit up on the side of the bed. When you feel okay, stand slowly while holding onto something. Move slowly. Avoid sudden body or head movements. Avoid certain positions, as told by your provider. Use a cane if you have trouble standing or walking. Sit down right away if you feel unsteady. Place items in your home so they're easy for you to reach without bending or leaning over. Return to normal activities when you're told. Ask what things are safe for you to do. General instructions Take your medicines only as told by your provider. Contact a health care provider if: Your medicines don't help or make your vertigo worse. You get new  symptoms. You have a fever. You have nausea or vomiting. Your family or friends spot any changes in how you're acting. A part of your body goes numb. You feel tingling and prickling in a part of your body. You get very bad headaches. Get help right away if: You're always dizzy or you faint. You have a stiff neck. You have trouble moving or speaking. Your hands, arms, or legs feel weak. Your hearing or eyesight changes. These symptoms may be an emergency. Call 911 right away. Do not wait to see if the symptoms will go away. Do not drive yourself to the hospital. This information is not intended to replace advice given to you by your health care provider. Make sure you discuss any questions you have with your health care provider. Document Revised: 03/03/2023 Document Reviewed: 09/03/2022 Elsevier Patient Education  2024 [employer].

## 2023-11-14 NOTE — Progress Notes (Signed)
 Subjective:    Patient ID: [name], female    DOB: 1951-02-14, 73 y.o.   MRN: [id number]  Chief Complaint  Patient presents with   Medical Management of Chronic Issues    Patient presents today for a 4 month follow-up.   Quality Metric Gaps    Zoster and colonoscopy    HPI Discussed the use of AI scribe software for clinical note transcription with the patient, who gave verbal consent to proceed.  History of Present Illness [name] is a 73 year old female who presents with vertigo.  She experienced vertigo starting on Friday, which was initially severe and affected her ability to get up and use the restroom. The symptoms improved by Sunday and have continued to improve by Monday. Her last episode of vertigo was over a year ago. She has previously seen a doctor for vertigo therapy but was not taught maneuvers to manage it.  She uses fluticasone  nasal spray at night and Zyrtec  for nasal symptoms, which include clear nasal drip, particularly noticeable on Sunday mornings at church. She suspects it might be related to an allergic reaction to something at church, possibly perfume. She drinks approximately 84 ounces of water daily to stay hydrated.  She mentions a mole that has been present for a long time. Her husband has similar skin lesions.  She is awaiting a colonoscopy that was supposed to be scheduled in May. Her last colonoscopy was in 2019, and she was due for a follow-up in 2024. No changes in bowel habits or stool consistency.  She was switched from omeprazole  to Jardiance , with plans to increase the dose from 10 mg to 25 mg. Her daughters have diabetes and have been using Mounjaro, which has significantly improved their A1c levels.  She has received the flu vaccine but not the shingles vaccine yet. Her tetanus vaccine is up to date until 2029, and she is current on pneumonia vaccination.    Past Medical History:  Diagnosis Date   Acute bronchitis  10/31/2015   Allergy    seasonal   Arthritis    knee-left   Cancer (HCC) 04/2018   right breast ca   Cataract    right eye   Diabetes mellitus 52   type 2   Diaphragmatic hernia without mention of obstruction or gangrene    Endometriosis    Esophageal reflux    Esophageal stricture    GERD (gastroesophageal reflux disease)    HOCM (hypertrophic obstructive cardiomyopathy) (HCC)    Basal septal hypertrophy 16 mm with increased ECV at 30% on cardiac MRI 06/2021 consistent with HOCM   Hyperlipidemia    Hyperplastic colon polyp    Hypertension 52   Nonalcoholic fatty liver disease 04/28/2010   Qualifier: Diagnosis of  By: Larrie Po MD, John E    Nonspecific abnormal results of liver function study    NSVT (nonsustained ventricular tachycardia) (HCC)    noted on event monitor 05/2021   Personal history of radiation therapy 08/2018   bilateral breasts   Pharyngitis 02/18/2012   Plantar fascial fibromatosis    Preventative health care 01/22/2013   Seasonal allergies 10/31/2015   Sleep apnea    has oral appliance but does not fit well due to missing teeth   Sore throat 04/03/2017   Tubular adenoma of colon    Unspecified sleep apnea    no c-pap    Past Surgical History:  Procedure Laterality Date   ABDOMINAL HYSTERECTOMY  1982   partial  APPENDECTOMY     benigh cyst in lung  2006   right   BREAST BIOPSY Bilateral 04/03/2018   BREAST BIOPSY Bilateral 03/23/2018   BREAST CYST INCISION AND DRAINAGE     right breast   BREAST EXCISIONAL BIOPSY Right 2019   BREAST LUMPECTOMY Bilateral 04/2018   BREAST LUMPECTOMY WITH RADIOACTIVE SEED LOCALIZATION Bilateral 05/02/2018   Procedure: RIGHT BREAST SEED LUMPECTOMY X2 AND LEFT BREAST SEED GUIDED LUMPECTOMY;  Surgeon: Lockie Rima, MD;  Location: [facility];  Service: General;  Laterality: Bilateral;   CATARACT EXTRACTION     right eye   HERNIA REPAIR     per pt, she is not aware of this surgery   RE-EXCISION OF  BREAST LUMPECTOMY Right 05/25/2018   Procedure: RE-EXCISION OF RIGHT BREAST LUMPECTOMY;  Surgeon: Lockie Rima, MD;  Location: [facility];  Service: General;  Laterality: Right;   seba     TUBAL LIGATION      Family History  Problem Relation Age of Onset   Breast cancer Mother    Hypertension Mother    Diabetes Mother        type 2   Other Mother        esophagus surgery   Heart disease Father    Hyperlipidemia Father    Hypertension Father    Heart attack Father        MI at age 35   Diabetes Brother        type 2   Heart disease Maternal Grandmother    Heart attack Maternal Grandfather    Hypertension Maternal Grandfather    Stroke Paternal Grandmother    Hypertension Sister    Diabetes Sister    Hypertension Brother    Alcohol abuse Maternal Aunt    Cancer Maternal Aunt    Colon cancer Neg Hx    Stomach cancer Neg Hx    Esophageal cancer Neg Hx     Social History   Socioeconomic History   Marital status: Married    Spouse name: Financial trader   Number of children: 2   Years of education: Not on file   Highest education level: GED or equivalent  Occupational History   Occupation: Insurance risk surveyor: [employer]  Tobacco Use   Smoking status: Never   Smokeless tobacco: Never  Substance and Sexual Activity   Alcohol use: No    Alcohol/week: 0.0 standard drinks of alcohol   Drug use: No   Sexual activity: Yes    Birth control/protection: Post-menopausal    Comment: lives with husband, no dietary restrictions just watching carbs, wears seat belt  Other Topics Concern   Not on file  Social History Narrative   Not on file   Social Drivers of Health   Financial Resource Strain: Low Risk  (10/25/2023)   Overall Financial Resource Strain (CARDIA)    Difficulty of Paying Living Expenses: Not hard at all  Food Insecurity: No Food Insecurity (10/25/2023)   Hunger Vital Sign    Worried About Running Out of Food in the Last Year: Never true     Ran Out of Food in the Last Year: Never true  Transportation Needs: No Transportation Needs (10/25/2023)   PRAPARE - Administrator, Civil Service (Medical): No    Lack of Transportation (Non-Medical): No  Physical Activity: Inactive (10/25/2023)   Exercise Vital Sign    Days of Exercise per Week: 0 days    Minutes of Exercise  per Session: 0 min  Stress: No Stress Concern Present (10/25/2023)   [employer] of Occupational Health - Occupational Stress Questionnaire    Feeling of Stress : Not at all  Social Connections: Socially Integrated (10/25/2023)   Social Connection and Isolation Panel [NHANES]    Frequency of Communication with Friends and Family: More than three times a week    Frequency of Social Gatherings with Friends and Family: More than three times a week    Attends Religious Services: More than 4 times per year    Active Member of [employer] or Organizations: Yes    Attends Engineer, structural: More than 4 times per year    Marital Status: Married  Catering manager Violence: Not At Risk (10/25/2023)   Humiliation, Afraid, Rape, and Kick questionnaire    Fear of Current or Ex-Partner: No    Emotionally Abused: No    Physically Abused: No    Sexually Abused: No    Outpatient Medications Prior to Visit  Medication Sig Dispense Refill   albuterol  (VENTOLIN  HFA) 108 (90 Base) MCG/ACT inhaler Inhale 2 puffs by mouth into the lungs every 6 (six) hours as needed for wheezing or shortness of breath. 6.7 g 0   ASPIR-LOW 81 MG EC tablet TAKE 1 TABLET BY MOUTH DAILY 30 tablet 3   bisoprolol -hydrochlorothiazide  (ZIAC ) 5-6.25 MG tablet Take 1 tablet by mouth daily. 90 tablet 1   cetirizine  (ZYRTEC ) 10 MG tablet Take 1 tablet (10 mg total) by mouth daily as needed for allergies or rhinitis. 100 tablet 3   Continuous Glucose Receiver (FREESTYLE LIBRE 3 READER) DEVI Use as directed to check blood glucose continuously. 1 each 0   Continuous Glucose Sensor (FREESTYLE  LIBRE 3 SENSOR) MISC Inject 1 sensor into the skin every 14 (fourteen) days. Use to check blood glucose continuously. 2 each 3   empagliflozin  (JARDIANCE ) 10 MG TABS tablet Take 1 tablet (10 mg total) by mouth daily. 90 tablet 3   famotidine  (PEPCID ) 20 MG tablet Take 1 tablet (20 mg total) by mouth at bedtime. 30 tablet 3   fluticasone  (FLONASE ) 50 MCG/ACT nasal spray Place 2 sprays into both nostrils daily. 16 g 5   glucose blood (ONETOUCH VERIO) test strip Use as directed to test blood sugar 2 (two) times daily. 100 each 11   Magnesium  Oxide 400 MG CAPS Take 1 capsule (400 mg total) by mouth daily. 90 capsule 3   metFORMIN  (GLUCOPHAGE ) 1000 MG tablet Take 1 tablet (1,000 mg total) by mouth 2 (two) times daily with a meal. 180 tablet 3   omeprazole  (PRILOSEC) 40 MG capsule Take 1 capsule (40 mg total) by mouth 2 (two) times daily. 180 capsule 0   rosuvastatin  (CRESTOR ) 5 MG tablet Take 1 tablet (5 mg total) by mouth daily. 90 tablet 3   hyoscyamine  (LEVSIN SL) 0.125 MG SL tablet Place 1 tablet (0.125 mg total) under the tongue every 4 (four) hours as needed. 30 tablet 1   Facility-Administered Medications Prior to Visit  Medication Dose Route Frequency Provider Last Rate Last Admin   Chlorhexidine  Gluconate Cloth 2 % PADS 6 each  6 each Topical Once Byerly, Faera, MD       And   Chlorhexidine  Gluconate Cloth 2 % PADS 6 each  6 each Topical Once Byerly, Faera, MD        Allergies  Allergen Reactions   Pravastatin  Other (See Comments)    Myalgia, fatigue, memory loss   Amoxicillin Itching  REACTION: rash   Atorvastatin  Other (See Comments)    Myalgia, fatigue, memory loss   Codeine Nausea And Vomiting    Review of Systems  Constitutional:  Positive for malaise/fatigue. Negative for fever.  HENT:  Positive for congestion.   Eyes:  Negative for blurred vision.  Respiratory:  Positive for sputum production. Negative for shortness of breath.   Cardiovascular:  Negative for chest pain,  palpitations and leg swelling.  Gastrointestinal:  Negative for abdominal pain, blood in stool and nausea.  Genitourinary:  Negative for dysuria and frequency.  Musculoskeletal:  Negative for falls.  Skin:  Negative for rash.  Neurological:  Positive for dizziness. Negative for loss of consciousness and headaches.  Endo/Heme/Allergies:  Negative for environmental allergies.  Psychiatric/Behavioral:  Negative for depression. The patient is not nervous/anxious.        Objective:     Physical Exam Constitutional:      General: She is not in acute distress.    Appearance: Normal appearance. She is well-developed. She is not toxic-appearing.  HENT:     Head: Normocephalic and atraumatic.     Right Ear: External ear normal.     Left Ear: External ear normal.     Nose: Nose normal.  Eyes:     General:        Right eye: No discharge.        Left eye: No discharge.     Conjunctiva/sclera: Conjunctivae normal.  Neck:     Thyroid : No thyromegaly.  Cardiovascular:     Rate and Rhythm: Normal rate and regular rhythm.     Heart sounds: Normal heart sounds. No murmur heard. Pulmonary:     Effort: Pulmonary effort is normal. No respiratory distress.     Breath sounds: Normal breath sounds.  Abdominal:     General: Bowel sounds are normal.     Palpations: Abdomen is soft.     Tenderness: There is no abdominal tenderness. There is no guarding.  Musculoskeletal:        General: Normal range of motion.     Cervical back: Neck supple.  Lymphadenopathy:     Cervical: No cervical adenopathy.  Skin:    General: Skin is warm and dry.  Neurological:     Mental Status: She is alert and oriented to person, place, and time.  Psychiatric:        Mood and Affect: Mood normal.        Behavior: Behavior normal.        Thought Content: Thought content normal.        Judgment: Judgment normal.     BP 126/78   Pulse 85   Resp 16   Ht 5' 2.5" (1.588 m)   Wt 175 lb 12.8 oz (79.7 kg)   SpO2  97%   BMI 31.64 kg/m  Wt Readings from Last 3 Encounters:  11/14/23 175 lb 12.8 oz (79.7 kg)  10/25/23 180 lb (81.6 kg)  07/13/23 180 lb (81.6 kg)    Diabetic Foot Exam - Simple   No data filed    Lab Results  Component Value Date   WBC 5.7 06/28/2023   HGB 12.4 06/28/2023   HCT 39.1 06/28/2023   PLT 259.0 06/28/2023   GLUCOSE 93 06/28/2023   CHOL 128 06/28/2023   TRIG 129.0 06/28/2023   HDL 43.50 06/28/2023   LDLDIRECT 96.0 08/19/2020   LDLCALC 59 06/28/2023   ALT 37 (H) 06/28/2023   AST 25 06/28/2023   NA 140  06/28/2023   K 4.3 06/28/2023   CL 101 06/28/2023   CREATININE 0.98 06/28/2023   BUN 12 06/28/2023   CO2 28 06/28/2023   TSH 1.13 06/28/2023   HGBA1C 7.4 (H) 06/28/2023   MICROALBUR <0.7 06/28/2023    Lab Results  Component Value Date   TSH 1.13 06/28/2023   Lab Results  Component Value Date   WBC 5.7 06/28/2023   HGB 12.4 06/28/2023   HCT 39.1 06/28/2023   MCV 77.8 (L) 06/28/2023   PLT 259.0 06/28/2023   Lab Results  Component Value Date   NA 140 06/28/2023   K 4.3 06/28/2023   CO2 28 06/28/2023   GLUCOSE 93 06/28/2023   BUN 12 06/28/2023   CREATININE 0.98 06/28/2023   BILITOT 0.4 06/28/2023   ALKPHOS 87 06/28/2023   AST 25 06/28/2023   ALT 37 (H) 06/28/2023   PROT 7.2 06/28/2023   ALBUMIN 4.3 06/28/2023   CALCIUM  9.9 06/28/2023   ANIONGAP 9 03/26/2023   EGFR 60 05/29/2021   GFR 58.36 (L) 06/28/2023   Lab Results  Component Value Date   CHOL 128 06/28/2023   Lab Results  Component Value Date   HDL 43.50 06/28/2023   Lab Results  Component Value Date   LDLCALC 59 06/28/2023   Lab Results  Component Value Date   TRIG 129.0 06/28/2023   Lab Results  Component Value Date   CHOLHDL 3 06/28/2023   Lab Results  Component Value Date   HGBA1C 7.4 (H) 06/28/2023       Assessment & Plan:  Diabetes mellitus without complication (HCC) Assessment & Plan: hgba1c acceptable, minimize simple carbs. Increase exercise as  tolerated. Continue current meds  Orders: -     Comprehensive metabolic panel with GFR  Essential hypertension Assessment & Plan: Well controlled, no changes to meds. Encouraged heart healthy diet such as the DASH diet and exercise as tolerated.    Hyperlipidemia, mixed Assessment & Plan: Tolerating statin, encouraged heart healthy diet, avoid trans fats, minimize simple carbs and saturated fats. Increase exercise as tolerated   Type 2 diabetes mellitus with hyperglycemia, without long-term current use of insulin  [facility]) Assessment & Plan: hgba1c acceptable, minimize simple carbs. Increase exercise as tolerated. Continue current meds   Skin lesions -     Ambulatory referral to Dermatology  Sun-damaged skin -     Ambulatory referral to Dermatology  Polyp of colon, unspecified part of colon, unspecified type -     Ambulatory referral to Gastroenterology  Elevated lipase -     Lipase  Other orders -     Azelastine HCl; Place 1-2 sprays into both nostrils 2 (two) times daily as needed for rhinitis.  Dispense: 30 mL; Refill: 2    Assessment and Plan Assessment & Plan Vertigo Intermittent vertigo episodes, possibly linked to dehydration or allergies. Mild nystagmus noted. - Instructed to look up Dix-Hallpike maneuver on YouTube for self-management. - Offered referral to therapy for vertigo maneuvers if needed.  Vasomotor rhinitis Intermittent nasal dripping, possibly triggered by environmental factors. Current management includes Flonase  and Zyrtec . Considered adding Astelin nasal spray. - Prescribed Astelin nasal spray for use on Friday and Saturday nights. - Discussed option of purchasing Astelin over-the-counter or through insurance.  Seborrheic keratosis Recommended dermatological evaluation due to concerns about a specific lesion. Emphasized importance of regular skin checks after age 34. - Referred to dermatology for evaluation of skin lesions. - Encouraged  regular dermatological evaluations every couple of years after age 80.  Colonoscopy  follow-up Awaiting scheduling of follow-up colonoscopy due to previous polyps. Gastroenterology department experiencing delays. - Initiated referral to gastroenterology for colonoscopy scheduling. - Will follow up with gastroenterology to ensure appointment is scheduled.  General Health Maintenance Discussed immunizations including shingles and RSV vaccines. Emphasized benefits in reducing dementia and respiratory complications. - Consider shingles and RSV vaccines. - Review vaccination status and update as needed.      Randie Bustle, MD

## 2023-11-15 ENCOUNTER — Ambulatory Visit: Payer: Self-pay | Admitting: Family Medicine

## 2023-11-15 ENCOUNTER — Encounter: Payer: Self-pay | Admitting: Gastroenterology

## 2023-11-15 LAB — COMPREHENSIVE METABOLIC PANEL WITH GFR
ALT: 28 U/L (ref 0–35)
AST: 25 U/L (ref 0–37)
Albumin: 4.3 g/dL (ref 3.5–5.2)
Alkaline Phosphatase: 84 U/L (ref 39–117)
BUN: 22 mg/dL (ref 6–23)
CO2: 23 meq/L (ref 19–32)
Calcium: 10.2 mg/dL (ref 8.4–10.5)
Chloride: 102 meq/L (ref 96–112)
Creatinine, Ser: 1.28 mg/dL — ABNORMAL HIGH (ref 0.40–1.20)
GFR: 42.24 mL/min — ABNORMAL LOW (ref >60.00)
Glucose, Bld: 241 mg/dL — ABNORMAL HIGH (ref 70–99)
Potassium: 4.2 meq/L (ref 3.5–5.1)
Sodium: 138 meq/L (ref 135–145)
Total Bilirubin: 0.3 mg/dL (ref 0.2–1.2)
Total Protein: 7.2 g/dL (ref 6.0–8.3)

## 2023-11-15 LAB — LIPASE: Lipase: 58 U/L (ref 11.0–59.0)

## 2023-11-29 ENCOUNTER — Other Ambulatory Visit: Payer: Self-pay | Admitting: Physician Assistant

## 2023-11-29 ENCOUNTER — Other Ambulatory Visit (HOSPITAL_BASED_OUTPATIENT_CLINIC_OR_DEPARTMENT_OTHER): Payer: Self-pay

## 2023-11-29 ENCOUNTER — Other Ambulatory Visit: Payer: Self-pay

## 2023-11-29 MED ORDER — FAMOTIDINE 20 MG PO TABS
20.0000 mg | ORAL_TABLET | Freq: Every day | ORAL | 0 refills | Status: DC
  Filled 2023-11-29: qty 30, 30d supply, fill #0

## 2023-12-20 ENCOUNTER — Encounter: Payer: Self-pay | Admitting: Internal Medicine

## 2023-12-20 ENCOUNTER — Other Ambulatory Visit: Payer: Self-pay

## 2023-12-20 ENCOUNTER — Other Ambulatory Visit (HOSPITAL_BASED_OUTPATIENT_CLINIC_OR_DEPARTMENT_OTHER): Payer: Self-pay

## 2023-12-20 ENCOUNTER — Ambulatory Visit (INDEPENDENT_AMBULATORY_CARE_PROVIDER_SITE_OTHER): Admitting: Internal Medicine

## 2023-12-20 VITALS — BP 124/80 | HR 68 | Ht 62.5 in | Wt 175.0 lb

## 2023-12-20 DIAGNOSIS — E1165 Type 2 diabetes mellitus with hyperglycemia: Secondary | ICD-10-CM

## 2023-12-20 DIAGNOSIS — Z7984 Long term (current) use of oral hypoglycemic drugs: Secondary | ICD-10-CM | POA: Diagnosis not present

## 2023-12-20 LAB — POCT GLYCOSYLATED HEMOGLOBIN (HGB A1C): Hemoglobin A1C: 8.1 % — AB (ref 4.0–5.6)

## 2023-12-20 MED ORDER — METFORMIN HCL 1000 MG PO TABS
1000.0000 mg | ORAL_TABLET | Freq: Two times a day (BID) | ORAL | 3 refills | Status: DC
  Filled 2023-12-20 – 2024-02-09 (×2): qty 180, 90d supply, fill #0

## 2023-12-20 MED ORDER — EMPAGLIFLOZIN 25 MG PO TABS
25.0000 mg | ORAL_TABLET | Freq: Every day | ORAL | 3 refills | Status: DC
  Filled 2023-12-20: qty 90, 90d supply, fill #0
  Filled 2024-03-13: qty 90, 90d supply, fill #1
  Filled 2024-06-13: qty 90, 90d supply, fill #2

## 2023-12-20 MED ORDER — GLIMEPIRIDE 2 MG PO TABS
2.0000 mg | ORAL_TABLET | Freq: Every day | ORAL | 3 refills | Status: DC
  Filled 2023-12-20: qty 30, 30d supply, fill #0
  Filled 2024-01-15: qty 30, 30d supply, fill #1
  Filled 2024-02-14: qty 30, 30d supply, fill #2
  Filled 2024-03-13: qty 30, 30d supply, fill #3

## 2023-12-20 NOTE — Progress Notes (Signed)
 Name: [name]  Age/ Sex: 73 y.o., female   MRN/ DOB: [id number], 17-Dec-1950     PCP: Domenica Harlene LABOR, MD   Reason for Endocrinology Evaluation: Type 2 Diabetes Mellitus  Initial Endocrine Consultative Visit: 02/22/2017    PATIENT IDENTIFIER: [name] is a 73 y.o. female with a past medical history of T2DM, HTN , Dyslipidemia, Hx of breast Ca ( S/P right lumpectomy) . The patient has followed with Endocrinology clinic since 02/16/2019 for consultative assistance with management of her diabetes.     DIABETIC HISTORY:  [name] was diagnosed with T2DM in 2001. She has been on metformin , Glimepiride  and Januvia . Invokana  added in 2018 but was cost prohibitive. Her hemoglobin A1c has ranged from 7.0% in 2016, peaking at 8.8% in  2021.    She used to see Dr.Gherghe but was lost to follow up by 2019 , until her return to our clinic 08/2019.    Started Rybelsus  08/2020 but this had to be stopped by the end of 2022 due to inability to qualify for pt assistance   Switched glipizide  to repaglinide  01/2022 Started Ozempic  07/2022  Stopped repaglinide  12/2022, with an A1c of 7.1%  Discontinued Ozempic  and started Jardiance  due to GI side effects to Ozempic  06/2023  SUBJECTIVE:   During the last visit (07/13/2023):  A1c  7.4 %     Today (12/20/2023): [name] is here for a follow up on diabetes management.  She checks her blood sugars 2 times daily The patient has had hypoglycemic episodes since the last clinic visit, which typically occur during the day , she has not been symptomatic    She continues with nausea  No recent constipation or diarrhea    HOME DIABETES REGIMEN:  Metformin  1000 mg, BID Jardiance  10 mg daily   Statin:  Crestor  ACE-I/ARB: No   CONTINUOUS GLUCOSE MONITORING RECORD INTERPRETATION    Dates of Recording: 6/25 - 12/20/2023  Sensor description:freestyle  Results statistics:   CGM use % of time 91  Average and SD 172/21.6  Time  in range 66 %  % Time Above 180 30  % Time above 250 4  % Time Below target 0   Glycemic patterns summary: BG's trend down overnight and fluctuate during the day Hyperglycemic episodes postprandial  Hypoglycemic episodes occurred N/A  Overnight periods: Variable   DIABETIC COMPLICATIONS: Microvascular complications:   Denies: CKD, retinopathy , neuropathy Last Eye Exam: Completed 01/14/2023  Macrovascular complications:   Denies: CAD, CVA, PVD   HISTORY:  Past Medical History:  Past Medical History:  Diagnosis Date   Acute bronchitis 10/31/2015   Allergy    seasonal   Arthritis    knee-left   Cancer (HCC) 04/2018   right breast ca   Cataract    right eye   Diabetes mellitus 52   type 2   Diaphragmatic hernia without mention of obstruction or gangrene    Endometriosis    Esophageal reflux    Esophageal stricture    GERD (gastroesophageal reflux disease)    HOCM (hypertrophic obstructive cardiomyopathy) (HCC)    Basal septal hypertrophy 16 mm with increased ECV at 30% on cardiac MRI 06/2021 consistent with HOCM   Hyperlipidemia    Hyperplastic colon polyp    Hypertension 52   Nonalcoholic fatty liver disease 04/28/2010   Qualifier: Diagnosis of  By: Mavis MD, John E    Nonspecific abnormal results of liver function study    NSVT (nonsustained  ventricular tachycardia) (HCC)    noted on event monitor 05/2021   Personal history of radiation therapy 08/2018   bilateral breasts   Pharyngitis 02/18/2012   Plantar fascial fibromatosis    Preventative health care 01/22/2013   Seasonal allergies 10/31/2015   Sleep apnea    has oral appliance but does not fit well due to missing teeth   Sore throat 04/03/2017   Tubular adenoma of colon    Unspecified sleep apnea    no c-pap   Past Surgical History:  Past Surgical History:  Procedure Laterality Date   ABDOMINAL HYSTERECTOMY  1982   partial   APPENDECTOMY     benigh cyst in lung  2006   right   BREAST  BIOPSY Bilateral 04/03/2018   BREAST BIOPSY Bilateral 03/23/2018   BREAST CYST INCISION AND DRAINAGE     right breast   BREAST EXCISIONAL BIOPSY Right 2019   BREAST LUMPECTOMY Bilateral 04/2018   BREAST LUMPECTOMY WITH RADIOACTIVE SEED LOCALIZATION Bilateral 05/02/2018   Procedure: RIGHT BREAST SEED LUMPECTOMY X2 AND LEFT BREAST SEED GUIDED LUMPECTOMY;  Surgeon: Aron Shoulders, MD;  Location: [facility];  Service: General;  Laterality: Bilateral;   CATARACT EXTRACTION     right eye   HERNIA REPAIR     per pt, she is not aware of this surgery   RE-EXCISION OF BREAST LUMPECTOMY Right 05/25/2018   Procedure: RE-EXCISION OF RIGHT BREAST LUMPECTOMY;  Surgeon: Aron Shoulders, MD;  Location: [facility];  Service: General;  Laterality: Right;   seba     TUBAL LIGATION     Social History:  reports that she has never smoked. She has never used smokeless tobacco. She reports that she does not drink alcohol and does not use drugs. Family History:  Family History  Problem Relation Age of Onset   Breast cancer Mother    Hypertension Mother    Diabetes Mother        type 2   Other Mother        esophagus surgery   Heart disease Father    Hyperlipidemia Father    Hypertension Father    Heart attack Father        MI at age 70   Diabetes Brother        type 2   Heart disease Maternal Grandmother    Heart attack Maternal Grandfather    Hypertension Maternal Grandfather    Stroke Paternal Grandmother    Hypertension Sister    Diabetes Sister    Hypertension Brother    Alcohol abuse Maternal Aunt    Cancer Maternal Aunt    Colon cancer Neg Hx    Stomach cancer Neg Hx    Esophageal cancer Neg Hx      HOME MEDICATIONS: Allergies as of 12/20/2023       Reactions   Pravastatin  Other (See Comments)   Myalgia, fatigue, memory loss   Amoxicillin Itching   REACTION: rash   Atorvastatin  Other (See Comments)   Myalgia, fatigue, memory loss   Codeine Nausea And  Vomiting        Medication List        Accurate as of December 20, 2023  1:56 PM. If you have any questions, ask your nurse or doctor.          albuterol  108 (90 Base) MCG/ACT inhaler Commonly known as: VENTOLIN  HFA Inhale 2 puffs by mouth into the lungs every 6 (six) hours as needed for wheezing or  shortness of breath.   Aspir-Low 81 MG tablet Generic drug: aspirin  EC TAKE 1 TABLET BY MOUTH DAILY   Azelastine  HCl 137 MCG/SPRAY Soln Place 1-2 sprays into both nostrils 2 (two) times daily as needed for rhinitis.   bisoprolol -hydrochlorothiazide  5-6.25 MG tablet Commonly known as: ZIAC  Take 1 tablet by mouth daily.   cetirizine  10 MG tablet Commonly known as: ZYRTEC  Take 1 tablet (10 mg total) by mouth daily as needed for allergies or rhinitis.   famotidine  20 MG tablet Commonly known as: PEPCID  Take 1 tablet (20 mg total) by mouth at bedtime.   fluticasone  50 MCG/ACT nasal spray Commonly known as: FLONASE  Place 2 sprays into both nostrils daily.   FreeStyle Libre 3 Reader McDowell Use as directed to check blood glucose continuously.   FreeStyle Libre 3 Sensor Misc Inject 1 sensor into the skin every 14 (fourteen) days. Use to check blood glucose continuously.   Jardiance  10 MG Tabs tablet Generic drug: empagliflozin  Take 1 tablet (10 mg total) by mouth daily.   Magnesium  Oxide 400 MG Caps Take 1 capsule (400 mg total) by mouth daily.   metFORMIN  1000 MG tablet Commonly known as: GLUCOPHAGE  Take 1 tablet (1,000 mg total) by mouth 2 (two) times daily with a meal.   omeprazole  40 MG capsule Commonly known as: PRILOSEC Take 1 capsule (40 mg total) by mouth 2 (two) times daily.   OneTouch Verio test strip Generic drug: glucose blood Use as directed to test blood sugar 2 (two) times daily.   rosuvastatin  5 MG tablet Commonly known as: CRESTOR  Take 1 tablet (5 mg total) by mouth daily.         OBJECTIVE:   Vital Signs: BP 124/80 (BP Location: Left Arm,  Patient Position: Sitting, Cuff Size: Normal)   Pulse 68   Ht 5' 2.5 (1.588 m)   Wt 175 lb (79.4 kg)   SpO2 99%   BMI 31.50 kg/m      Exam: General: Pt appears well and is in NAD  Lungs: Clear with good BS bilat  Heart: RRR   Abdomen: soft, nontender  Extremities: No pretibial edema.   Neuro: MS is good with appropriate affect, pt is alert and Ox3       DM foot exam 07/13/2023  The skin of the feet is intact without sores or ulcerations. The pedal pulses are 2+ on right and 2+ on left. The sensation is intact to a screening 5.07, 10 gram monofilament bilaterally  DATA REVIEWED:  Lab Results  Component Value Date   HGBA1C 8.1 (A) 12/20/2023   HGBA1C 7.4 (H) 06/28/2023   HGBA1C 7.0 (H) 02/15/2023    Latest Reference Range & Units 11/14/23 14:50  Sodium 135 - 145 mEq/L 138  Potassium 3.5 - 5.1 mEq/L 4.2  Chloride 96 - 112 mEq/L 102  CO2 19 - 32 mEq/L 23  Glucose 70 - 99 mg/dL 758 (H)  BUN 6 - 23 mg/dL 22  Creatinine 9.59 - 8.79 mg/dL 8.71 (H)  Calcium  8.4 - 10.5 mg/dL 89.7  Alkaline Phosphatase 39 - 117 U/L 84  Albumin 3.5 - 5.2 g/dL 4.3  Lipase 88.9 - 40.9 U/L 58.0  AST 0 - 37 U/L 25  ALT 0 - 35 U/L 28  Total Protein 6.0 - 8.3 g/dL 7.2  Total Bilirubin 0.2 - 1.2 mg/dL 0.3  GFR >39.99 mL/min 42.24 (L)    ASSESSMENT / PLAN / RECOMMENDATIONS:   1) Type 2 Diabetes Mellitus, poorly controlled, Without complications - Most  recent A1c of 8.1 %. Goal A1c < 7.0 %.     - Patient with worsening glycemic control  -Intolerant to Ozempic  due to multiple GI side effects -We have opted to avoid Mounjaro at this time due to persistent nausea -Tolerating Jardiance , will increase the dose as below, encourage hydration -I have recommended glimepiride , caution against hypoglycemia   MEDICATIONS:  -Start glimepiride  2 mg, 1 tablet before breakfast - Increase Jardiance  25 mg daily  -Continue metformin  1000 mg twice daily   EDUCATION / INSTRUCTIONS: BG monitoring  instructions: Patient is instructed to check her blood sugars 2 times a day, fasting and supper time. Call [facility] if: BG persistently < 70  I reviewed the Rule of 15 for the treatment of hypoglycemia in detail with the patient. Literature supplied.    2) Diabetic complications:  Eye: Does not have known diabetic retinopathy.  Neuro/ Feet: Does not have known diabetic peripheral neuropathy .  Renal: Patient does not have known baseline CKD. She   is not on an ACEI/ARB at present.  MA/CR.normal   3) Dyslipidemia:   -LDL at goal with most recent lipid panel through PCPs office -Continue atorvastatin  5 mg daily    F/U in 3 months   Signed electronically by: Stefano Redgie Butts, MD  [facility]  [facility] [address] [place] [place], [place] [address] Phone: [phone] FAX: [phone]   CC: Domenica Harlene LABOR, MD [address] [place] [address] Phone: [phone]  Fax: [phone]  Return to Endocrinology clinic as below: Future Appointments  Date Time Provider Department Center  12/20/2023  2:00 PM Froylan Hobby, Donell Redgie, MD [facility] None  01/12/2024 11:00 AM Zehr, Harlene BIRCH, PA-C [facility]  02/09/2024  2:20 PM Domenica Harlene LABOR, MD [facility]  05/22/2024  1:30 PM Orman Erminio POUR, PA-C [facility] None  11/01/2024 11:30 AM [facility] ANNUAL WELLNESS VISIT 2 [facility]

## 2023-12-20 NOTE — Patient Instructions (Addendum)
-   Glimepiride  2 mg, 1 tablet before breakfast  - Jardiance  25  mg, 1 tablet every morning  - Continue Metformin  1000 mg , 1 tablet with breakfast and 1 tablet with dinner     HOW TO TREAT LOW BLOOD SUGARS (Blood sugar LESS THAN 70 MG/DL) Please follow the RULE OF 15 for the treatment of hypoglycemia treatment (when your (blood sugars are less than 70 mg/dL)   STEP 1: Take 15 grams of carbohydrates when your blood sugar is low, which includes:  3-4 GLUCOSE TABS  OR 3-4 OZ OF JUICE OR REGULAR SODA OR ONE TUBE OF GLUCOSE GEL    STEP 2: RECHECK blood sugar in 15 MINUTES STEP 3: If your blood sugar is still low at the 15 minute recheck --> then, go back to STEP 1 and treat AGAIN with another 15 grams of carbohydrates.

## 2024-01-12 ENCOUNTER — Encounter: Payer: Self-pay | Admitting: Gastroenterology

## 2024-01-12 ENCOUNTER — Other Ambulatory Visit (HOSPITAL_BASED_OUTPATIENT_CLINIC_OR_DEPARTMENT_OTHER): Payer: Self-pay

## 2024-01-12 ENCOUNTER — Ambulatory Visit: Admitting: Gastroenterology

## 2024-01-12 VITALS — BP 118/62 | HR 62 | Ht 62.0 in | Wt 176.0 lb

## 2024-01-12 DIAGNOSIS — Z860101 Personal history of adenomatous and serrated colon polyps: Secondary | ICD-10-CM | POA: Diagnosis not present

## 2024-01-12 DIAGNOSIS — Z09 Encounter for follow-up examination after completed treatment for conditions other than malignant neoplasm: Secondary | ICD-10-CM | POA: Diagnosis not present

## 2024-01-12 MED ORDER — NA SULFATE-K SULFATE-MG SULF 17.5-3.13-1.6 GM/177ML PO SOLN
1.0000 | ORAL | 0 refills | Status: DC
  Filled 2024-01-12: qty 354, 1d supply, fill #0

## 2024-01-12 NOTE — Patient Instructions (Addendum)
 We have sent the following medications to your pharmacy for you to pick up at your convenience: Suprep   You have been scheduled for a colonoscopy. Please follow written instructions given to you at your visit today.   If you use inhalers (even only as needed), please bring them with you on the day of your procedure.  DO NOT TAKE 7 DAYS PRIOR TO TEST- Trulicity (dulaglutide) Ozempic , Wegovy  (semaglutide ) Mounjaro (tirzepatide) Bydureon Bcise (exanatide extended release)  DO NOT TAKE 1 DAY PRIOR TO YOUR TEST Rybelsus  (semaglutide ) Adlyxin (lixisenatide) Victoza  (liraglutide ) Byetta (exanatide) ___________________________________________________________________________   _______________________________________________________  If your blood pressure at your visit was 140/90 or greater, please contact your primary care physician to follow up on this.  _______________________________________________________  If you are age 42 or older, your body mass index should be between 23-30. Your Body mass index is 32.19 kg/m. If this is out of the aforementioned range listed, please consider follow up with your Primary Care Provider.  If you are age 35 or younger, your body mass index should be between 19-25. Your Body mass index is 32.19 kg/m. If this is out of the aformentioned range listed, please consider follow up with your Primary Care Provider.   ________________________________________________________  The [facility] providers would like to encourage you to use MYCHART to communicate with providers for non-urgent requests or questions.  Due to long hold times on the telephone, sending your provider a message by [facility] may be a faster and more efficient way to get a response.  Please allow 48 business hours for a response.  Please remember that this is for non-urgent requests.  _______________________________________________________  [facility] is using a team-based  approach to care.  Your team is made up of your doctor and two to three APPS. Our APPS (Nurse Practitioners and Physician Assistants) work with your physician to ensure care continuity for you. They are fully qualified to address your health concerns and develop a treatment plan. They communicate directly with your gastroenterologist to care for you. Seeing the Advanced Practice Practitioners on your physician's team can help you by facilitating care more promptly, often allowing for earlier appointments, access to diagnostic testing, procedures, and other specialty referrals.   Thank you for choosing me and [facility].  Jessica Zehr, PA-C

## 2024-01-12 NOTE — Progress Notes (Signed)
 01/12/2024 [name] [id number] 04/03/1951   HISTORY OF PRESENT ILLNESS:  This is a 73 year old female who was previously a patient of Dr. Albin.  She is being established with Dr. Shila.  She is here today to schedule and discuss colonoscopy.  Her last was in 12/2020 as below with a 3 year recall.  She is doing well without any lower GI symptoms.  Moves her bowels regularly without bleeding.  She also follows here for GERD symptoms and is currently on omeprazole  40 mg daily in the morning and pepcid  20 mg at bedtime.    Colonoscopy 12/2020:  - One 3 mm polyp in the ascending colon, removed with a cold biopsy forceps. Resected and retrieved. - Four 5 to 6 mm polyps in the rectum, in the sigmoid colon and in the transverse colon, removed with a cold snare. Resected and retrieved. - Internal hemorrhoids. - The examination was otherwise normal on direct and retroflexion views.  Surgical [P], colon, transverse x 2 and ascending x 1, sigmoid x 1, polyp (5) - TUBULAR ADENOMA(S) WITHOUT HIGH-GRADE DYSPLASIA OR MALIGNANCY - HYPERPLASTIC POLYP(S)   Past Medical History:  Diagnosis Date   Acute bronchitis 10/31/2015   Allergy    seasonal   Arthritis    knee-left   Cancer (HCC) 04/2018   right breast ca   Cataract    right eye   Diabetes mellitus 52   type 2   Diaphragmatic hernia without mention of obstruction or gangrene    Endometriosis    Esophageal reflux    Esophageal stricture    GERD (gastroesophageal reflux disease)    HOCM (hypertrophic obstructive cardiomyopathy) (HCC)    Basal septal hypertrophy 16 mm with increased ECV at 30% on cardiac MRI 06/2021 consistent with HOCM   Hyperlipidemia    Hyperplastic colon polyp    Hypertension 52   Nonalcoholic fatty liver disease 04/28/2010   Qualifier: Diagnosis of  By: Mavis MD, John E    Nonspecific abnormal results of liver function study    NSVT (nonsustained ventricular tachycardia) (HCC)    noted on event  monitor 05/2021   Personal history of radiation therapy 08/2018   bilateral breasts   Pharyngitis 02/18/2012   Plantar fascial fibromatosis    Preventative health care 01/22/2013   Seasonal allergies 10/31/2015   Sleep apnea    has oral appliance but does not fit well due to missing teeth   Sore throat 04/03/2017   Tubular adenoma of colon    Unspecified sleep apnea    no c-pap   Past Surgical History:  Procedure Laterality Date   ABDOMINAL HYSTERECTOMY  1982   partial   APPENDECTOMY     benigh cyst in lung  2006   right   BREAST BIOPSY Bilateral 04/03/2018   BREAST BIOPSY Bilateral 03/23/2018   BREAST CYST INCISION AND DRAINAGE     right breast   BREAST EXCISIONAL BIOPSY Right 2019   BREAST LUMPECTOMY Bilateral 04/2018   BREAST LUMPECTOMY WITH RADIOACTIVE SEED LOCALIZATION Bilateral 05/02/2018   Procedure: RIGHT BREAST SEED LUMPECTOMY X2 AND LEFT BREAST SEED GUIDED LUMPECTOMY;  Surgeon: Aron Shoulders, MD;  Location: [facility];  Service: General;  Laterality: Bilateral;   CATARACT EXTRACTION     right eye   HERNIA REPAIR     per pt, she is not aware of this surgery   RE-EXCISION OF BREAST LUMPECTOMY Right 05/25/2018   Procedure: RE-EXCISION OF RIGHT BREAST LUMPECTOMY;  Surgeon:  Aron Shoulders, MD;  Location: [facility];  Service: General;  Laterality: Right;   seba     TUBAL LIGATION      reports that she has never smoked. She has never used smokeless tobacco. She reports that she does not drink alcohol and does not use drugs. family history includes Alcohol abuse in her maternal aunt; Breast cancer in her mother; Cancer in her maternal aunt; Diabetes in her brother, mother, and sister; Heart attack in her father and maternal grandfather; Heart disease in her father and maternal grandmother; Hyperlipidemia in her father; Hypertension in her brother, father, maternal grandfather, mother, and sister; Other in her mother; Stroke in her paternal  grandmother. Allergies  Allergen Reactions   Pravastatin  Other (See Comments)    Myalgia, fatigue, memory loss   Amoxicillin Itching    REACTION: rash   Atorvastatin  Other (See Comments)    Myalgia, fatigue, memory loss   Codeine Nausea And Vomiting      Outpatient Encounter Medications as of 01/12/2024  Medication Sig   albuterol  (VENTOLIN  HFA) 108 (90 Base) MCG/ACT inhaler Inhale 2 puffs by mouth into the lungs every 6 (six) hours as needed for wheezing or shortness of breath.   ASPIR-LOW 81 MG EC tablet TAKE 1 TABLET BY MOUTH DAILY   azelastine  (ASTELIN ) 0.1 % nasal spray Place 1-2 sprays into both nostrils 2 (two) times daily as needed for rhinitis.   bisoprolol -hydrochlorothiazide  (ZIAC ) 5-6.25 MG tablet Take 1 tablet by mouth daily.   cetirizine  (ZYRTEC ) 10 MG tablet Take 1 tablet (10 mg total) by mouth daily as needed for allergies or rhinitis.   Continuous Glucose Receiver (FREESTYLE LIBRE 3 READER) DEVI Use as directed to check blood glucose continuously.   Continuous Glucose Sensor (FREESTYLE LIBRE 3 SENSOR) MISC Inject 1 sensor into the skin every 14 (fourteen) days. Use to check blood glucose continuously.   empagliflozin  (JARDIANCE ) 25 MG TABS tablet Take 1 tablet (25 mg total) by mouth daily before breakfast.   famotidine  (PEPCID ) 20 MG tablet Take 1 tablet (20 mg total) by mouth at bedtime.   fluticasone  (FLONASE ) 50 MCG/ACT nasal spray Place 2 sprays into both nostrils daily.   glimepiride  (AMARYL ) 2 MG tablet Take 1 tablet (2 mg total) by mouth daily before breakfast.   glucose blood (ONETOUCH VERIO) test strip Use as directed to test blood sugar 2 (two) times daily.   Magnesium  Oxide 400 MG CAPS Take 1 capsule (400 mg total) by mouth daily.   metFORMIN  (GLUCOPHAGE ) 1000 MG tablet Take 1 tablet (1,000 mg total) by mouth 2 (two) times daily with a meal.   omeprazole  (PRILOSEC) 40 MG capsule Take 1 capsule (40 mg total) by mouth 2 (two) times daily.   rosuvastatin   (CRESTOR ) 5 MG tablet Take 1 tablet (5 mg total) by mouth daily.   Facility-Administered Encounter Medications as of 01/12/2024  Medication   Chlorhexidine  Gluconate Cloth 2 % PADS 6 each   And   Chlorhexidine  Gluconate Cloth 2 % PADS 6 each     REVIEW OF SYSTEMS  : All other systems reviewed and negative except where noted in the History of Present Illness.   PHYSICAL EXAM: BP 118/62   Pulse 62   Ht 5' 2 (1.575 m)   Wt 176 lb (79.8 kg)   BMI 32.19 kg/m  General: Well developed AA female in no acute distress Head: Normocephalic and atraumatic Eyes:  Sclerae anicteric, conjunctiva pink. Ears: Normal auditory acuity Lungs: Clear throughout to auscultation;  no W/R/R. Heart: Regular rate and rhythm; no M/R/G. Rectal:  Will be done at the time of colonoscopy. Musculoskeletal: Symmetrical with no gross deformities  Skin: No lesions on visible extremities Neurological: Alert oriented x 4, grossly non-focal Psychological:  Alert and cooperative. Normal mood and affect  ASSESSMENT AND PLAN: *History of adenomatous colon polyps:  Last colonoscopy 12/2020 with 5 polyps removed, some of those TAs.  Recall for 12/2023.  Will schedule with Dr. Nandigam.  The risks, benefits, and alternatives to colonoscopy were discussed with the patient and she consents to proceed.   CC:  Domenica Harlene LABOR, MD

## 2024-01-16 ENCOUNTER — Other Ambulatory Visit (HOSPITAL_BASED_OUTPATIENT_CLINIC_OR_DEPARTMENT_OTHER): Payer: Self-pay

## 2024-01-20 ENCOUNTER — Encounter: Payer: Self-pay | Admitting: Gastroenterology

## 2024-01-30 ENCOUNTER — Encounter: Payer: Self-pay | Admitting: Gastroenterology

## 2024-01-30 ENCOUNTER — Ambulatory Visit (AMBULATORY_SURGERY_CENTER): Admitting: Gastroenterology

## 2024-01-30 ENCOUNTER — Other Ambulatory Visit: Payer: Self-pay | Admitting: Physician Assistant

## 2024-01-30 ENCOUNTER — Other Ambulatory Visit: Payer: Self-pay | Admitting: Family Medicine

## 2024-01-30 VITALS — BP 115/77 | HR 81 | Temp 98.0°F | Resp 12 | Ht 62.0 in | Wt 176.0 lb

## 2024-01-30 DIAGNOSIS — I10 Essential (primary) hypertension: Secondary | ICD-10-CM | POA: Diagnosis not present

## 2024-01-30 DIAGNOSIS — K5730 Diverticulosis of large intestine without perforation or abscess without bleeding: Secondary | ICD-10-CM | POA: Diagnosis not present

## 2024-01-30 DIAGNOSIS — Z1211 Encounter for screening for malignant neoplasm of colon: Secondary | ICD-10-CM

## 2024-01-30 DIAGNOSIS — K644 Residual hemorrhoidal skin tags: Secondary | ICD-10-CM

## 2024-01-30 DIAGNOSIS — K648 Other hemorrhoids: Secondary | ICD-10-CM

## 2024-01-30 DIAGNOSIS — D124 Benign neoplasm of descending colon: Secondary | ICD-10-CM | POA: Diagnosis not present

## 2024-01-30 DIAGNOSIS — Z860101 Personal history of adenomatous and serrated colon polyps: Secondary | ICD-10-CM | POA: Diagnosis not present

## 2024-01-30 DIAGNOSIS — D123 Benign neoplasm of transverse colon: Secondary | ICD-10-CM

## 2024-01-30 DIAGNOSIS — K635 Polyp of colon: Secondary | ICD-10-CM | POA: Diagnosis not present

## 2024-01-30 DIAGNOSIS — G4730 Sleep apnea, unspecified: Secondary | ICD-10-CM | POA: Diagnosis not present

## 2024-01-30 DIAGNOSIS — E119 Type 2 diabetes mellitus without complications: Secondary | ICD-10-CM | POA: Diagnosis not present

## 2024-01-30 MED ORDER — SODIUM CHLORIDE 0.9 % IV SOLN: 500.0000 mL | Freq: Once | INTRAVENOUS | Status: DC

## 2024-01-30 NOTE — Patient Instructions (Addendum)
 Resume previous diet  Continue present medications  Await pathology results  Repeat colonoscopy in 5 years for surveillance based on pathology results See handouts for polyps, hemorrhoids and diverticulosis  YOU HAD AN ENDOSCOPIC PROCEDURE TODAY AT THE [facility]:   Refer to the procedure report that was given to you for any specific questions about what was found during the examination.  If the procedure report does not answer your questions, please call your gastroenterologist to clarify.  If you requested that your care partner not be given the details of your procedure findings, then the procedure report has been included in a sealed envelope for you to review at your convenience later.  YOU SHOULD EXPECT: Some feelings of bloating in the abdomen. Passage of more gas than usual.  Walking can help get rid of the air that was put into your GI tract during the procedure and reduce the bloating. If you had a lower endoscopy (such as a colonoscopy or flexible sigmoidoscopy) you may notice spotting of blood in your stool or on the toilet paper. If you underwent a bowel prep for your procedure, you may not have a normal bowel movement for a few days.  Please Note:  You might notice some irritation and congestion in your nose or some drainage.  This is from the oxygen used during your procedure.  There is no need for concern and it should clear up in a day or so.  SYMPTOMS TO REPORT IMMEDIATELY: Following lower endoscopy (colonoscopy or flexible sigmoidoscopy):  Excessive amounts of blood in the stool  Significant tenderness or worsening of abdominal pains  Swelling of the abdomen that is new, acute  Fever of 100F or higher  For urgent or emergent issues, a gastroenterologist can be reached at any hour by calling [phone]. Do not use MyChart messaging for urgent concerns.   DIET:  We do recommend a small meal at first, but then you may proceed to your regular diet.  Drink plenty  of fluids but you should avoid alcoholic beverages for 24 hours.  ACTIVITY:  You should plan to take it easy for the rest of today and you should NOT DRIVE or use heavy machinery until tomorrow (because of the sedation medicines used during the test).    FOLLOW UP: Our staff will call the number listed on your records the next business day following your procedure.  We will call around 7:15- 8:00 am to check on you and address any questions or concerns that you may have regarding the information given to you following your procedure. If we do not reach you, we will leave a message.     If any biopsies were taken you will be contacted by phone or by letter within the next 1-3 weeks.  Please call us  at [phone] if you have not heard about the biopsies in 3 weeks.   SIGNATURES/CONFIDENTIALITY: You and/or your care partner have signed paperwork which will be entered into your electronic medical record.  These signatures attest to the fact that that the information above on your After Visit Summary has been reviewed and is understood.  Full responsibility of the confidentiality of this discharge information lies with you and/or your care-partner.

## 2024-01-30 NOTE — Progress Notes (Signed)
 Report given to PACU, vss

## 2024-01-30 NOTE — Progress Notes (Signed)
 Pt's states no medical or surgical changes since previsit or office visit.

## 2024-01-30 NOTE — Progress Notes (Signed)
 Called to room to assist during endoscopic procedure.  Patient ID and intended procedure confirmed with present staff. Received instructions for my participation in the procedure from the performing physician.

## 2024-01-30 NOTE — Op Note (Signed)
 [facility] Patient Name: [name] Procedure Date: 01/30/2024 10:22 AM MRN: [id number] Endoscopist: Gustav ALONSO Mcgee , MD, [id number] Age: 73 Referring MD:  Date of Birth: June 07, 1951 Gender: Female Account #: [id number] Procedure:                Colonoscopy Indications:              High risk colon cancer surveillance: Personal                            history of adenoma less than 10 mm in size Medicines:                Monitored Anesthesia Care Procedure:                Pre-Anesthesia Assessment:                           - Prior to the procedure, a History and Physical                            was performed, and patient medications and                            allergies were reviewed. The patient's tolerance of                            previous anesthesia was also reviewed. The risks                            and benefits of the procedure and the sedation                            options and risks were discussed with the patient.                            All questions were answered, and informed consent                            was obtained. Prior Anticoagulants: The patient has                            taken no anticoagulant or antiplatelet agents. ASA                            Grade Assessment: III - A patient with severe                            systemic disease. After reviewing the risks and                            benefits, the patient was deemed in satisfactory                            condition to undergo the procedure.  After obtaining informed consent, the colonoscope                            was passed under direct vision. Throughout the                            procedure, the patient's blood pressure, pulse, and                            oxygen saturations were monitored continuously. The                            Olympus Scope [id number] was introduced through the                            anus  and advanced to the the cecum, identified by                            appendiceal orifice and ileocecal valve. The                            colonoscopy was performed without difficulty. The                            patient tolerated the procedure well. The quality                            of the bowel preparation was good. The ileocecal                            valve, appendiceal orifice, and rectum were                            photographed. Scope In: 10:37:48 AM Scope Out: 10:52:47 AM Scope Withdrawal Time: 0 hours 11 minutes 53 seconds  Total Procedure Duration: 0 hours 14 minutes 59 seconds  Findings:                 The perianal and digital rectal examinations were                            normal.                           Two sessile polyps were found in the descending                            colon and transverse colon. The polyps were 3 to 5                            mm in size. These polyps were removed with a cold                            snare. Resection and retrieval were complete.  A few small-mouthed diverticula were found in the                            sigmoid colon.                           Non-bleeding external and internal hemorrhoids were                            found during retroflexion. The hemorrhoids were                            small. Complications:            No immediate complications. Estimated Blood Loss:     Estimated blood loss was minimal. Impression:               - Two 3 to 5 mm polyps in the descending colon and                            in the transverse colon, removed with a cold snare.                            Resected and retrieved.                           - Diverticulosis in the sigmoid colon.                           - Non-bleeding external and internal hemorrhoids. Recommendation:           - Patient has a contact number available for                            emergencies. The signs and  symptoms of potential                            delayed complications were discussed with the                            patient. Return to normal activities tomorrow.                            Written discharge instructions were provided to the                            patient.                           - Resume previous diet.                           - Continue present medications.                           - Await pathology results.                           -  Repeat colonoscopy in 5 years for surveillance                            based on pathology results. Marvel Mcphillips V. Avilyn Virtue, MD 01/30/2024 11:13:34 AM This report has been signed electronically.

## 2024-01-30 NOTE — Progress Notes (Signed)
 [facility] History and Physical   Primary Care Physician:  Domenica Harlene LABOR, MD   Reason for Procedure:  History of adenomatous colon polyps  Plan:    Surveillance colonoscopy with possible interventions as needed     HPI: [name] is a very pleasant 73 y.o. female here for surveillance colonoscopy. Denies any nausea, vomiting, abdominal pain, melena or bright red blood per rectum  The risks and benefits as well as alternatives of endoscopic procedure(s) have been discussed and reviewed. All questions answered. The patient agrees to proceed.    Past Medical History:  Diagnosis Date   Acute bronchitis 10/31/2015   Allergy    seasonal   Arthritis    knee-left   Cancer (HCC) 04/2018   right breast ca   Cataract    right eye   Diabetes mellitus 52   type 2   Diaphragmatic hernia without mention of obstruction or gangrene    Endometriosis    Esophageal reflux    Esophageal stricture    GERD (gastroesophageal reflux disease)    HOCM (hypertrophic obstructive cardiomyopathy) (HCC)    Basal septal hypertrophy 16 mm with increased ECV at 30% on cardiac MRI 06/2021 consistent with HOCM   Hyperlipidemia    Hyperplastic colon polyp    Hypertension 52   Nonalcoholic fatty liver disease 04/28/2010   Qualifier: Diagnosis of  By: Mavis MD, Norleen BRAVO    Nonspecific abnormal results of liver function study    NSVT (nonsustained ventricular tachycardia) (HCC)    noted on event monitor 05/2021   Personal history of radiation therapy 08/2018   bilateral breasts   Pharyngitis 02/18/2012   Plantar fascial fibromatosis    Preventative health care 01/22/2013   Seasonal allergies 10/31/2015   Sleep apnea    has oral appliance but does not fit well due to missing teeth   Sore throat 04/03/2017   Tubular adenoma of colon    Unspecified sleep apnea    no c-pap    Past Surgical History:  Procedure Laterality Date   ABDOMINAL HYSTERECTOMY  1982   partial    APPENDECTOMY     benigh cyst in lung  2006   right   BREAST BIOPSY Bilateral 04/03/2018   BREAST BIOPSY Bilateral 03/23/2018   BREAST CYST INCISION AND DRAINAGE     right breast   BREAST EXCISIONAL BIOPSY Right 2019   BREAST LUMPECTOMY Bilateral 04/2018   BREAST LUMPECTOMY WITH RADIOACTIVE SEED LOCALIZATION Bilateral 05/02/2018   Procedure: RIGHT BREAST SEED LUMPECTOMY X2 AND LEFT BREAST SEED GUIDED LUMPECTOMY;  Surgeon: Aron Shoulders, MD;  Location: [facility];  Service: General;  Laterality: Bilateral;   CATARACT EXTRACTION     right eye   HERNIA REPAIR     per pt, she is not aware of this surgery   RE-EXCISION OF BREAST LUMPECTOMY Right 05/25/2018   Procedure: RE-EXCISION OF RIGHT BREAST LUMPECTOMY;  Surgeon: Aron Shoulders, MD;  Location: [facility];  Service: General;  Laterality: Right;   seba     TUBAL LIGATION      Prior to Admission medications   Medication Sig Start Date End Date Taking? Authorizing Provider  ASPIR-LOW 81 MG EC tablet TAKE 1 TABLET BY MOUTH DAILY 01/16/15  Yes Domenica Harlene LABOR, MD  azelastine  (ASTELIN ) 0.1 % nasal spray Place 1-2 sprays into both nostrils 2 (two) times daily as needed for rhinitis. 11/14/23  Yes Domenica Harlene LABOR, MD  bisoprolol -hydrochlorothiazide  (ZIAC ) 5-6.25 MG tablet Take 1  tablet by mouth daily. 09/15/23  Yes Domenica Harlene LABOR, MD  cetirizine  (ZYRTEC ) 10 MG tablet Take 1 tablet (10 mg total) by mouth daily as needed for allergies or rhinitis. 06/07/23  Yes Domenica Harlene LABOR, MD  Continuous Glucose Receiver (FREESTYLE LIBRE 3 READER) DEVI Use as directed to check blood glucose continuously. 02/21/23  Yes Domenica Harlene LABOR, MD  Continuous Glucose Sensor (FREESTYLE LIBRE 3 SENSOR) MISC Inject 1 sensor into the skin every 14 (fourteen) days. Use to check blood glucose continuously. 10/03/23  Yes Domenica Harlene LABOR, MD  empagliflozin  (JARDIANCE ) 25 MG TABS tablet Take 1 tablet (25 mg total) by mouth daily before breakfast. 12/20/23  Yes  Shamleffer, Ibtehal Jaralla, MD  famotidine  (PEPCID ) 20 MG tablet Take 1 tablet (20 mg total) by mouth at bedtime. 11/29/23  Yes Esterwood, Amy S, PA-C  glimepiride  (AMARYL ) 2 MG tablet Take 1 tablet (2 mg total) by mouth daily before breakfast. 12/20/23  Yes Shamleffer, Ibtehal Jaralla, MD  Magnesium  Oxide 400 MG CAPS Take 1 capsule (400 mg total) by mouth daily. 06/04/21  Yes Shlomo Wilbert SAUNDERS, MD  metFORMIN  (GLUCOPHAGE ) 1000 MG tablet Take 1 tablet (1,000 mg total) by mouth 2 (two) times daily with a meal. 12/20/23 12/19/24 Yes Shamleffer, Donell Cardinal, MD  omeprazole  (PRILOSEC) 40 MG capsule Take 1 capsule (40 mg total) by mouth 2 (two) times daily. 08/30/23  Yes Esterwood, Amy S, PA-C  rosuvastatin  (CRESTOR ) 5 MG tablet Take 1 tablet (5 mg total) by mouth daily. 07/13/23  Yes Shamleffer, Ibtehal Jaralla, MD  albuterol  (VENTOLIN  HFA) 108 (90 Base) MCG/ACT inhaler Inhale 2 puffs by mouth into the lungs every 6 (six) hours as needed for wheezing or shortness of breath. 07/21/22   Domenica Harlene LABOR, MD  fluticasone  (FLONASE ) 50 MCG/ACT nasal spray Place 2 sprays into both nostrils daily. 08/30/23   Domenica Harlene LABOR, MD  glucose blood (ONETOUCH VERIO) test strip Use as directed to test blood sugar 2 (two) times daily. 08/04/23   Shamleffer, Ibtehal Jaralla, MD    Current Outpatient Medications  Medication Sig Dispense Refill   ASPIR-LOW 81 MG EC tablet TAKE 1 TABLET BY MOUTH DAILY 30 tablet 3   azelastine  (ASTELIN ) 0.1 % nasal spray Place 1-2 sprays into both nostrils 2 (two) times daily as needed for rhinitis. 30 mL 2   bisoprolol -hydrochlorothiazide  (ZIAC ) 5-6.25 MG tablet Take 1 tablet by mouth daily. 90 tablet 1   cetirizine  (ZYRTEC ) 10 MG tablet Take 1 tablet (10 mg total) by mouth daily as needed for allergies or rhinitis. 100 tablet 3   Continuous Glucose Receiver (FREESTYLE LIBRE 3 READER) DEVI Use as directed to check blood glucose continuously. 1 each 0   Continuous Glucose Sensor (FREESTYLE LIBRE 3  SENSOR) MISC Inject 1 sensor into the skin every 14 (fourteen) days. Use to check blood glucose continuously. 2 each 3   empagliflozin  (JARDIANCE ) 25 MG TABS tablet Take 1 tablet (25 mg total) by mouth daily before breakfast. 90 tablet 3   famotidine  (PEPCID ) 20 MG tablet Take 1 tablet (20 mg total) by mouth at bedtime. 30 tablet 0   glimepiride  (AMARYL ) 2 MG tablet Take 1 tablet (2 mg total) by mouth daily before breakfast. 30 tablet 3   Magnesium  Oxide 400 MG CAPS Take 1 capsule (400 mg total) by mouth daily. 90 capsule 3   metFORMIN  (GLUCOPHAGE ) 1000 MG tablet Take 1 tablet (1,000 mg total) by mouth 2 (two) times daily with a meal. 180 tablet 3  omeprazole  (PRILOSEC) 40 MG capsule Take 1 capsule (40 mg total) by mouth 2 (two) times daily. 180 capsule 0   rosuvastatin  (CRESTOR ) 5 MG tablet Take 1 tablet (5 mg total) by mouth daily. 90 tablet 3   albuterol  (VENTOLIN  HFA) 108 (90 Base) MCG/ACT inhaler Inhale 2 puffs by mouth into the lungs every 6 (six) hours as needed for wheezing or shortness of breath. 6.7 g 0   fluticasone  (FLONASE ) 50 MCG/ACT nasal spray Place 2 sprays into both nostrils daily. 16 g 5   glucose blood (ONETOUCH VERIO) test strip Use as directed to test blood sugar 2 (two) times daily. 100 each 11   Current Facility-Administered Medications  Medication Dose Route Frequency Provider Last Rate Last Admin   0.9 %  sodium chloride  infusion  500 mL Intravenous Once Blakelee Allington V, MD       Facility-Administered Medications Ordered in Other Visits  Medication Dose Route Frequency Provider Last Rate Last Admin   Chlorhexidine  Gluconate Cloth 2 % PADS 6 each  6 each Topical Once Byerly, Faera, MD       And   Chlorhexidine  Gluconate Cloth 2 % PADS 6 each  6 each Topical Once Byerly, Faera, MD        Allergies as of 01/30/2024 - Review Complete 01/30/2024  Allergen Reaction Noted   Atorvastatin  Other (See Comments) 06/27/2014   Pravastatin  Other (See Comments) 06/27/2014    Amoxicillin Itching 09/26/2007   Codeine Nausea And Vomiting     Family History  Problem Relation Age of Onset   Breast cancer Mother    Hypertension Mother    Diabetes Mother        type 2   Other Mother        esophagus surgery   Heart disease Father    Hyperlipidemia Father    Hypertension Father    Heart attack Father        MI at age 65   Hypertension Sister    Diabetes Sister    Diabetes Brother        type 2   Hypertension Brother    Heart disease Maternal Grandmother    Heart attack Maternal Grandfather    Hypertension Maternal Grandfather    Stroke Paternal Grandmother    Alcohol abuse Maternal Aunt    Cancer Maternal Aunt    Colon cancer Neg Hx    Esophageal cancer Neg Hx    Liver disease Neg Hx     Social History   Socioeconomic History   Marital status: Married    Spouse name: Financial trader   Number of children: 2   Years of education: Not on file   Highest education level: GED or equivalent  Occupational History   Occupation: Insurance risk surveyor: [employer]  Tobacco Use   Smoking status: Never   Smokeless tobacco: Never  Vaping Use   Vaping status: Never Used  Substance and Sexual Activity   Alcohol use: No    Alcohol/week: 0.0 standard drinks of alcohol   Drug use: No   Sexual activity: Yes    Birth control/protection: Post-menopausal    Comment: lives with husband, no dietary restrictions just watching carbs, wears seat belt  Other Topics Concern   Not on file  Social History Narrative   Not on file   Social Drivers of Health   Financial Resource Strain: Low Risk  (10/25/2023)   Overall Financial Resource Strain (CARDIA)    Difficulty of Paying Living Expenses:  Not hard at all  Food Insecurity: No Food Insecurity (10/25/2023)   Hunger Vital Sign    Worried About Running Out of Food in the Last Year: Never true    Ran Out of Food in the Last Year: Never true  Transportation Needs: No Transportation Needs (10/25/2023)   PRAPARE -  Administrator, Civil Service (Medical): No    Lack of Transportation (Non-Medical): No  Physical Activity: Inactive (10/25/2023)   Exercise Vital Sign    Days of Exercise per Week: 0 days    Minutes of Exercise per Session: 0 min  Stress: No Stress Concern Present (10/25/2023)   [employer] of Occupational Health - Occupational Stress Questionnaire    Feeling of Stress : Not at all  Social Connections: Socially Integrated (10/25/2023)   Social Connection and Isolation Panel    Frequency of Communication with Friends and Family: More than three times a week    Frequency of Social Gatherings with Friends and Family: More than three times a week    Attends Religious Services: More than 4 times per year    Active Member of [employer] or Organizations: Yes    Attends Engineer, structural: More than 4 times per year    Marital Status: Married  Catering manager Violence: Not At Risk (10/25/2023)   Humiliation, Afraid, Rape, and Kick questionnaire    Fear of Current or Ex-Partner: No    Emotionally Abused: No    Physically Abused: No    Sexually Abused: No    Review of Systems:  All other review of systems negative except as mentioned in the HPI.  Physical Exam: Vital signs in last 24 hours: BP 130/83   Pulse 77   Temp 98 F (36.7 C) (Skin)   Ht 5' 2 (1.575 m)   Wt 176 lb (79.8 kg)   SpO2 99%   BMI 32.19 kg/m  General:   Alert, NAD Lungs:  Clear .   Heart:  Regular rate and rhythm Abdomen:  Soft, nontender and nondistended. Neuro/Psych:  Alert and cooperative. Normal mood and affect. A and O x 3  Reviewed labs, radiology imaging, old records and pertinent past GI work up  Patient is appropriate for planned procedure(s) and anesthesia in an ambulatory setting   K. Veena Michale Emmerich , MD [phone]

## 2024-01-31 ENCOUNTER — Telehealth: Payer: Self-pay

## 2024-01-31 ENCOUNTER — Other Ambulatory Visit (HOSPITAL_BASED_OUTPATIENT_CLINIC_OR_DEPARTMENT_OTHER): Payer: Self-pay

## 2024-01-31 ENCOUNTER — Other Ambulatory Visit: Payer: Self-pay

## 2024-01-31 MED ORDER — FREESTYLE LIBRE 3 SENSOR MISC
1.0000 | 3 refills | Status: DC
  Filled 2024-01-31: qty 2, 28d supply, fill #0
  Filled 2024-03-05: qty 2, 28d supply, fill #1
  Filled 2024-04-02: qty 2, 28d supply, fill #2
  Filled 2024-05-04: qty 2, 28d supply, fill #3

## 2024-01-31 MED ORDER — OMEPRAZOLE 40 MG PO CPDR
40.0000 mg | DELAYED_RELEASE_CAPSULE | Freq: Two times a day (BID) | ORAL | 3 refills | Status: AC
  Filled 2024-01-31: qty 180, 90d supply, fill #0
  Filled 2024-05-04: qty 180, 90d supply, fill #1

## 2024-01-31 MED ORDER — FAMOTIDINE 20 MG PO TABS
20.0000 mg | ORAL_TABLET | Freq: Every day | ORAL | 3 refills | Status: AC
  Filled 2024-01-31: qty 90, 90d supply, fill #0
  Filled 2024-05-24: qty 90, 90d supply, fill #1

## 2024-01-31 NOTE — Telephone Encounter (Signed)
 No answer after follow up call. Voice message left.

## 2024-02-01 LAB — SURGICAL PATHOLOGY

## 2024-02-03 ENCOUNTER — Telehealth: Payer: Self-pay

## 2024-02-03 NOTE — Telephone Encounter (Signed)
 Patient was identified as falling into the [place] Measure - Diabetes.   Patient was: Appointment already scheduled for:  PCP on 02/09/24. Gap noted in appt note.

## 2024-02-08 NOTE — Assessment & Plan Note (Signed)
 Well controlled, no changes to meds. Encouraged heart healthy diet such as the DASH diet and exercise as tolerated.

## 2024-02-08 NOTE — Assessment & Plan Note (Signed)
 hgba1c acceptable, minimize simple carbs. Increase exercise as tolerated. Continue current meds

## 2024-02-08 NOTE — Assessment & Plan Note (Signed)
 Tolerating statin, encouraged heart healthy diet, avoid trans fats, minimize simple carbs and saturated fats. Increase exercise as tolerated

## 2024-02-08 NOTE — Assessment & Plan Note (Signed)
Doing well without meds

## 2024-02-08 NOTE — Assessment & Plan Note (Signed)
 Patient encouraged to maintain heart healthy diet, regular exercise, adequate sleep. Consider daily probiotics. Take medications as prescribed Pap Mm 04/2023 repeat annually Colonoscopy 01/2024 awaiting pathology Dexa 02/2022 repeat every 2-5 years

## 2024-02-09 ENCOUNTER — Other Ambulatory Visit (HOSPITAL_BASED_OUTPATIENT_CLINIC_OR_DEPARTMENT_OTHER): Payer: Self-pay

## 2024-02-09 ENCOUNTER — Ambulatory Visit (INDEPENDENT_AMBULATORY_CARE_PROVIDER_SITE_OTHER): Payer: PPO | Admitting: Family Medicine

## 2024-02-09 ENCOUNTER — Encounter: Payer: Self-pay | Admitting: Family Medicine

## 2024-02-09 VITALS — BP 118/72 | HR 78 | Resp 16 | Ht 62.0 in | Wt 179.8 lb

## 2024-02-09 DIAGNOSIS — I10 Essential (primary) hypertension: Secondary | ICD-10-CM

## 2024-02-09 DIAGNOSIS — E1165 Type 2 diabetes mellitus with hyperglycemia: Secondary | ICD-10-CM | POA: Diagnosis not present

## 2024-02-09 DIAGNOSIS — F418 Other specified anxiety disorders: Secondary | ICD-10-CM | POA: Diagnosis not present

## 2024-02-09 DIAGNOSIS — E782 Mixed hyperlipidemia: Secondary | ICD-10-CM

## 2024-02-09 DIAGNOSIS — Z0000 Encounter for general adult medical examination without abnormal findings: Secondary | ICD-10-CM

## 2024-02-09 NOTE — Patient Instructions (Addendum)
 Shingrix is the new shingles shot, 2 shots over 2-6 months, confirm coverage with insurance and document, then can return here for shots with nurse appt or at pharmacy   RSV, Respiratory Syncitial Virus Vaccine, Arexvy at pharmacy  COVID and flu shots annually in fall  Tetanus is due for preventative purposes in 2029 but take sooner if injured   Preventive Care 65 Years and Older, Female Preventive care refers to lifestyle choices and visits with your health care provider that can promote health and wellness. Preventive care visits are also called wellness exams. What can I expect for my preventive care visit? Counseling Your health care provider may ask you questions about your: Medical history, including: Past medical problems. Family medical history. Pregnancy and menstrual history. History of falls. Current health, including: Memory and ability to understand (cognition). Emotional well-being. Home life and relationship well-being. Sexual activity and sexual health. Lifestyle, including: Alcohol, nicotine or tobacco, and drug use. Access to firearms. Diet, exercise, and sleep habits. Work and work Astronomer. Sunscreen use. Safety issues such as seatbelt and bike helmet use. Physical exam Your health care provider will check your: Height and weight. These may be used to calculate your BMI (body mass index). BMI is a measurement that tells if you are at a healthy weight. Waist circumference. This measures the distance around your waistline. This measurement also tells if you are at a healthy weight and may help predict your risk of certain diseases, such as type 2 diabetes and high blood pressure. Heart rate and blood pressure. Body temperature. Skin for abnormal spots. What immunizations do I need?  Vaccines are usually given at various ages, according to a schedule. Your health care provider will recommend vaccines for you based on your age, medical history, and lifestyle  or other factors, such as travel or where you work. What tests do I need? Screening Your health care provider may recommend screening tests for certain conditions. This may include: Lipid and cholesterol levels. Hepatitis C test. Hepatitis B test. HIV (human immunodeficiency virus) test. STI (sexually transmitted infection) testing, if you are at risk. Lung cancer screening. Colorectal cancer screening. Diabetes screening. This is done by checking your blood sugar (glucose) after you have not eaten for a while (fasting). Mammogram. Talk with your health care provider about how often you should have regular mammograms. BRCA-related cancer screening. This may be done if you have a family history of breast, ovarian, tubal, or peritoneal cancers. Bone density scan. This is done to screen for osteoporosis. Talk with your health care provider about your test results, treatment options, and if necessary, the need for more tests. Follow these instructions at home: Eating and drinking  Eat a diet that includes fresh fruits and vegetables, whole grains, lean protein, and low-fat dairy products. Limit your intake of foods with high amounts of sugar, saturated fats, and salt. Take vitamin and mineral supplements as recommended by your health care provider. Do not drink alcohol if your health care provider tells you not to drink. If you drink alcohol: Limit how much you have to 0-1 drink a day. Know how much alcohol is in your drink. In the [place] one drink equals one 12 oz bottle of beer (355 mL), one 5 oz glass of wine (148 mL), or one 1 oz glass of hard liquor (44 mL). Lifestyle Brush your teeth every morning and night with fluoride toothpaste. Floss one time each day. Exercise for at least 30 minutes 5 or more days each week.  Do not use any products that contain nicotine or tobacco. These products include cigarettes, chewing tobacco, and vaping devices, such as e-cigarettes. If you need help  quitting, ask your health care provider. Do not use drugs. If you are sexually active, practice safe sex. Use a condom or other form of protection in order to prevent STIs. Take aspirin  only as told by your health care provider. Make sure that you understand how much to take and what form to take. Work with your health care provider to find out whether it is safe and beneficial for you to take aspirin  daily. Ask your health care provider if you need to take a cholesterol-lowering medicine (statin). Find healthy ways to manage stress, such as: Meditation, yoga, or listening to music. Journaling. Talking to a trusted person. Spending time with friends and family. Minimize exposure to UV radiation to reduce your risk of skin cancer. Safety Always wear your seat belt while driving or riding in a vehicle. Do not drive: If you have been drinking alcohol. Do not ride with someone who has been drinking. When you are tired or distracted. While texting. If you have been using any mind-altering substances or drugs. Wear a helmet and other protective equipment during sports activities. If you have firearms in your house, make sure you follow all gun safety procedures. What's next? Visit your health care provider once a year for an annual wellness visit. Ask your health care provider how often you should have your eyes and teeth checked. Stay up to date on all vaccines. This information is not intended to replace advice given to you by your health care provider. Make sure you discuss any questions you have with your health care provider. Document Revised: 11/26/2020 Document Reviewed: 11/26/2020 Elsevier Patient Education  2024 [employer].

## 2024-02-09 NOTE — Progress Notes (Signed)
 Subjective:    Patient ID: [name], female    DOB: 03/29/1951, 73 y.o.   MRN: [id number]  Chief Complaint  Patient presents with  . Annual Exam    Patient presents today for a physical exam.  . Quality Metric Gaps    Eye exam, zoster vaccines, urine microalbumin    HPI Discussed the use of AI scribe software for clinical note transcription with the patient, who gave verbal consent to proceed.  History of Present Illness [name] is a 73 year old female who presents for a routine follow-up visit.  She is actively engaged in health maintenance activities, having scheduled her bone density test, Pap smear, and eye exam. She had a mammogram in November 2024 at the [facility] and is due for another later this year. Her last Pap smear was last year, though the record is not in her chart. She underwent a colonoscopy in 2018 where two small polyps were found.  She experiences knee pain that limits her ability to walk, but she remains active around the house. Additionally, she has aches, particularly in her hip, which improve with movement. She is working on increasing her protein intake and managing her weight, noting a recent increase from 175 to 179 pounds.  She has a history of breast cancer in both breasts, with the right side being more affected. She monitors her blood sugar, which drops to 69 when she skips meals, but she is not symptomatic at that level. Her last hemoglobin A1c was 8.1 in July 2025, and she is due for another test in October 2025.  She has no recent emergency room visits or significant health events. She is up to date with her dental visits and has no issues with swallowing or coughing.    Past Medical History:  Diagnosis Date  . Acute bronchitis 10/31/2015  . Allergy    seasonal  . Arthritis    knee-left  . Cancer (HCC) 04/2018   right breast ca  . Cataract    right eye  . Diabetes mellitus 52   type 2  . Diaphragmatic  hernia without mention of obstruction or gangrene   . Endometriosis   . Esophageal reflux   . Esophageal stricture   . GERD (gastroesophageal reflux disease)   . HOCM (hypertrophic obstructive cardiomyopathy) (HCC)    Basal septal hypertrophy 16 mm with increased ECV at 30% on cardiac MRI 06/2021 consistent with HOCM  . Hyperlipidemia   . Hyperplastic colon polyp   . Hypertension 52  . MI (myocardial infarction) (HCC)    per pt  . Nonalcoholic fatty liver disease 04/28/2010   Qualifier: Diagnosis of  By: Mavis MD, Norleen BRAVO   . Nonspecific abnormal results of liver function study   . NSVT (nonsustained ventricular tachycardia) (HCC)    noted on event monitor 05/2021  . Personal history of radiation therapy 08/2018   bilateral breasts  . Pharyngitis 02/18/2012  . Plantar fascial fibromatosis   . Preventative health care 01/22/2013  . Seasonal allergies 10/31/2015  . Sleep apnea    has oral appliance but does not fit well due to missing teeth  . Sore throat 04/03/2017  . Tubular adenoma of colon   . Unspecified sleep apnea    no c-pap    Past Surgical History:  Procedure Laterality Date  . ABDOMINAL HYSTERECTOMY  1982   partial  . APPENDECTOMY    . 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  BREAST BIOPSY Bilateral 04/03/2018  . BREAST BIOPSY Bilateral 03/23/2018  . BREAST CYST INCISION AND DRAINAGE     right breast  . BREAST EXCISIONAL BIOPSY Right 2019  . BREAST LUMPECTOMY Bilateral 04/2018  . BREAST LUMPECTOMY WITH RADIOACTIVE SEED LOCALIZATION Bilateral 05/02/2018   Procedure: RIGHT BREAST SEED LUMPECTOMY X2 AND LEFT BREAST SEED GUIDED LUMPECTOMY;  Surgeon: Aron Shoulders, MD;  Location: [facility];  Service: General;  Laterality: Bilateral;  . CATARACT EXTRACTION     right eye  . HERNIA REPAIR     per pt, she is not aware of this surgery  . RE-EXCISION OF BREAST LUMPECTOMY Right 05/25/2018   Procedure: RE-EXCISION OF RIGHT BREAST LUMPECTOMY;  Surgeon:  Aron Shoulders, MD;  Location: [facility];  Service: General;  Laterality: Right;  . seba    . TUBAL LIGATION      Family History  Problem Relation Age of Onset  . Breast cancer Mother   . Hypertension Mother   . Diabetes Mother        type 2  . Other Mother        esophagus surgery  . Heart disease Father   . Hyperlipidemia Father   . Hypertension Father   . Heart attack Father        MI at age 71  . Hypertension Sister   . Diabetes Sister   . Diabetes Brother        type 2  . Hypertension Brother   . Heart disease Maternal Grandmother   . Heart attack Maternal Grandfather   . Hypertension Maternal Grandfather   . Stroke Paternal Grandmother   . Alcohol abuse Maternal Aunt   . Cancer Maternal Aunt   . Colon cancer Neg Hx   . Esophageal cancer Neg Hx   . Liver disease Neg Hx     Social History   Socioeconomic History  . Marital status: Married    Spouse name: Evalene  . Number of children: 2  . Years of education: Not on file  . Highest education level: GED or equivalent  Occupational History  . Occupation: Insurance risk surveyor: [employer]  Tobacco Use  . Smoking status: Never  . Smokeless tobacco: Never  Vaping Use  . Vaping status: Never Used  Substance and Sexual Activity  . Alcohol use: No    Alcohol/week: 0.0 standard drinks of alcohol  . Drug use: No  . Sexual activity: Yes    Birth control/protection: Post-menopausal    Comment: lives with husband, no dietary restrictions just watching carbs, wears seat belt  Other Topics Concern  . Not on file  Social History Narrative  . Not on file   Social Drivers of Health   Financial Resource Strain: Low Risk  (02/09/2024)   Overall Financial Resource Strain (CARDIA)   . Difficulty of Paying Living Expenses: Not hard at all  Food Insecurity: No Food Insecurity (02/09/2024)   Hunger Vital Sign   . Worried About Programme researcher, broadcasting/film/video in the Last Year: Never true   . Ran Out of Food in  the Last Year: Never true  Transportation Needs: No Transportation Needs (02/09/2024)   PRAPARE - Transportation   . Lack of Transportation (Medical): No   . Lack of Transportation (Non-Medical): No  Physical Activity: Unknown (02/09/2024)   Exercise Vital Sign   . Days of Exercise per Week: 1 day   . Minutes of Exercise per Session: Not on file  Stress:  Stress Concern Present (02/09/2024)   [employer] of Occupational Health - Occupational Stress Questionnaire   . Feeling of Stress: To some extent  Social Connections: Socially Integrated (02/09/2024)   Social Connection and Isolation Panel   . Frequency of Communication with Friends and Family: More than three times a week   . Frequency of Social Gatherings with Friends and Family: More than three times a week   . Attends Religious Services: More than 4 times per year   . Active Member of Clubs or Organizations: Yes   . Attends Banker Meetings: More than 4 times per year   . Marital Status: Married  Catering manager Violence: Not At Risk (10/25/2023)   Humiliation, Afraid, Rape, and Kick questionnaire   . Fear of Current or Ex-Partner: No   . Emotionally Abused: No   . Physically Abused: No   . Sexually Abused: No    Outpatient Medications Prior to Visit  Medication Sig Dispense Refill  . albuterol  (VENTOLIN  HFA) 108 (90 Base) MCG/ACT inhaler Inhale 2 puffs by mouth into the lungs every 6 (six) hours as needed for wheezing or shortness of breath. 6.7 g 0  . ASPIR-LOW 81 MG EC tablet TAKE 1 TABLET BY MOUTH DAILY 30 tablet 3  . azelastine  (ASTELIN ) 0.1 % nasal spray Place 1-2 sprays into both nostrils 2 (two) times daily as needed for rhinitis. 30 mL 2  . bisoprolol -hydrochlorothiazide  (ZIAC ) 5-6.25 MG tablet Take 1 tablet by mouth daily. 90 tablet 1  . cetirizine  (ZYRTEC ) 10 MG tablet Take 1 tablet (10 mg total) by mouth daily as needed for allergies or rhinitis. 100 tablet 3  . Continuous Glucose Receiver (FREESTYLE  LIBRE 3 READER) DEVI Use as directed to check blood glucose continuously. 1 each 0  . Continuous Glucose Sensor (FREESTYLE LIBRE 3 SENSOR) MISC Inject 1 sensor into the skin every 14 (fourteen) days. Use to check blood glucose continuously. 2 each 3  . empagliflozin  (JARDIANCE ) 25 MG TABS tablet Take 1 tablet (25 mg total) by mouth daily before breakfast. 90 tablet 3  . famotidine  (PEPCID ) 20 MG tablet Take 1 tablet (20 mg total) by mouth at bedtime. 90 tablet 3  . fluticasone  (FLONASE ) 50 MCG/ACT nasal spray Place 2 sprays into both nostrils daily. 16 g 5  . glimepiride  (AMARYL ) 2 MG tablet Take 1 tablet (2 mg total) by mouth daily before breakfast. 30 tablet 3  . glucose blood (ONETOUCH VERIO) test strip Use as directed to test blood sugar 2 (two) times daily. 100 each 11  . Magnesium  Oxide 400 MG CAPS Take 1 capsule (400 mg total) by mouth daily. 90 capsule 3  . metFORMIN  (GLUCOPHAGE ) 1000 MG tablet Take 1 tablet (1,000 mg total) by mouth 2 (two) times daily with a meal. 180 tablet 3  . omeprazole  (PRILOSEC) 40 MG capsule Take 1 capsule (40 mg total) by mouth 2 (two) times daily. 180 capsule 3  . rosuvastatin  (CRESTOR ) 5 MG tablet Take 1 tablet (5 mg total) by mouth daily. 90 tablet 3   Facility-Administered Medications Prior to Visit  Medication Dose Route Frequency Provider Last Rate Last Admin  . Chlorhexidine  Gluconate Cloth 2 % PADS 6 each  6 each Topical Once Byerly, Faera, MD       And  . Chlorhexidine  Gluconate Cloth 2 % PADS 6 each  6 each Topical Once Byerly, Faera, MD        Allergies  Allergen Reactions  . Atorvastatin  Other (See Comments)  Myalgia, fatigue, memory loss  . Pravastatin  Other (See Comments)    Myalgia, fatigue, memory loss  . Amoxicillin Itching    REACTION: rash  . Codeine Nausea And Vomiting    Review of Systems  Constitutional:  Negative for chills, fever and malaise/fatigue.  HENT:  Negative for congestion and hearing loss.   Eyes:  Negative for  blurred vision and discharge.  Respiratory:  Negative for cough, sputum production and shortness of breath.   Cardiovascular:  Negative for chest pain, palpitations and leg swelling.  Gastrointestinal:  Negative for abdominal pain, blood in stool, constipation, diarrhea, heartburn, nausea and vomiting.  Genitourinary:  Negative for dysuria, frequency, hematuria and urgency.  Musculoskeletal:  Positive for back pain and joint pain. Negative for falls and myalgias.  Skin:  Negative for rash.  Neurological:  Negative for dizziness, sensory change, loss of consciousness, weakness and headaches.  Endo/Heme/Allergies:  Negative for environmental allergies. Does not bruise/bleed easily.  Psychiatric/Behavioral:  Negative for depression and suicidal ideas. The patient is not nervous/anxious and does not have insomnia.        Objective:    Physical Exam Constitutional:      General: She is not in acute distress.    Appearance: Normal appearance. She is not diaphoretic.  HENT:     Head: Normocephalic and atraumatic.     Right Ear: Tympanic membrane, ear canal and external ear normal.     Left Ear: Tympanic membrane, ear canal and external ear normal.     Nose: Nose normal.     Mouth/Throat:     Mouth: Mucous membranes are moist.     Pharynx: Oropharynx is clear. No oropharyngeal exudate.  Eyes:     General: No scleral icterus.       Right eye: No discharge.        Left eye: No discharge.     Conjunctiva/sclera: Conjunctivae normal.     Pupils: Pupils are equal, round, and reactive to light.  Neck:     Thyroid : No thyromegaly.  Cardiovascular:     Rate and Rhythm: Normal rate and regular rhythm.     Heart sounds: Normal heart sounds. No murmur heard. Pulmonary:     Effort: Pulmonary effort is normal. No respiratory distress.     Breath sounds: Normal breath sounds. No wheezing or rales.  Abdominal:     General: Bowel sounds are normal. There is no distension.     Palpations: Abdomen  is soft. There is no mass.     Tenderness: There is no abdominal tenderness.  Musculoskeletal:        General: No tenderness. Normal range of motion.     Cervical back: Normal range of motion and neck supple.  Lymphadenopathy:     Cervical: No cervical adenopathy.  Skin:    General: Skin is warm and dry.     Findings: No rash.  Neurological:     General: No focal deficit present.     Mental Status: She is alert and oriented to person, place, and time.     Cranial Nerves: No cranial nerve deficit.     Coordination: Coordination normal.     Deep Tendon Reflexes: Reflexes are normal and symmetric. Reflexes normal.  Psychiatric:        Mood and Affect: Mood normal.        Behavior: Behavior normal.        Thought Content: Thought content normal.        Judgment: Judgment normal.  BP 118/72   Pulse 78   Resp 16   Ht 5' 2 (1.575 m)   Wt 179 lb 12.8 oz (81.6 kg)   SpO2 97%   BMI 32.89 kg/m  Wt Readings from Last 3 Encounters:  02/09/24 179 lb 12.8 oz (81.6 kg)  01/30/24 176 lb (79.8 kg)  01/12/24 176 lb (79.8 kg)    Diabetic Foot Exam - Simple   No data filed    Lab Results  Component Value Date   WBC 5.7 06/28/2023   HGB 12.4 06/28/2023   HCT 39.1 06/28/2023   PLT 259.0 06/28/2023   GLUCOSE 241 (H) 11/14/2023   CHOL 128 06/28/2023   TRIG 129.0 06/28/2023   HDL 43.50 06/28/2023   LDLDIRECT 96.0 08/19/2020   LDLCALC 59 06/28/2023   ALT 28 11/14/2023   AST 25 11/14/2023   NA 138 11/14/2023   K 4.2 11/14/2023   CL 102 11/14/2023   CREATININE 1.28 (H) 11/14/2023   BUN 22 11/14/2023   CO2 23 11/14/2023   TSH 1.13 06/28/2023   HGBA1C 8.1 (A) 12/20/2023   MICROALBUR 0.50 09/27/2013    Lab Results  Component Value Date   TSH 1.13 06/28/2023   Lab Results  Component Value Date   WBC 5.7 06/28/2023   HGB 12.4 06/28/2023   HCT 39.1 06/28/2023   MCV 77.8 (L) 06/28/2023   PLT 259.0 06/28/2023   Lab Results  Component Value Date   NA 138 11/14/2023   K  4.2 11/14/2023   CO2 23 11/14/2023   GLUCOSE 241 (H) 11/14/2023   BUN 22 11/14/2023   CREATININE 1.28 (H) 11/14/2023   BILITOT 0.3 11/14/2023   ALKPHOS 84 11/14/2023   AST 25 11/14/2023   ALT 28 11/14/2023   PROT 7.2 11/14/2023   ALBUMIN 4.3 11/14/2023   CALCIUM  10.2 11/14/2023   ANIONGAP 9 03/26/2023   EGFR 60 05/29/2021   GFR 42.24 (L) 11/14/2023   Lab Results  Component Value Date   CHOL 128 06/28/2023   Lab Results  Component Value Date   HDL 43.50 06/28/2023   Lab Results  Component Value Date   LDLCALC 59 06/28/2023   Lab Results  Component Value Date   TRIG 129.0 06/28/2023   Lab Results  Component Value Date   CHOLHDL 3 06/28/2023   Lab Results  Component Value Date   HGBA1C 8.1 (A) 12/20/2023       Assessment & Plan:  Depression with anxiety Assessment & Plan: Doing well without meds   Essential hypertension Assessment & Plan: Well controlled, no changes to meds. Encouraged heart healthy diet such as the DASH diet and exercise as tolerated.   Orders: -     CBC with Differential/Platelet -     Comprehensive metabolic panel with GFR -     TSH  Hyperlipidemia, mixed Assessment & Plan: Tolerating statin, encouraged heart healthy diet, avoid trans fats, minimize simple carbs and saturated fats. Increase exercise as tolerated  Orders: -     Lipid panel  Type 2 diabetes mellitus with hyperglycemia, without long-term current use of insulin  (HCC) Assessment & Plan: hgba1c acceptable, minimize simple carbs. Increase exercise as tolerated. Continue current meds  Orders: -     Microalbumin / creatinine urine ratio  Preventative health care Assessment & Plan: Patient encouraged to maintain heart healthy diet, regular exercise, adequate sleep. Consider daily probiotics. Take medications as prescribed Pap Mm 04/2023 repeat annually Colonoscopy 01/2024 awaiting pathology Dexa 02/2022 repeat every 2-5 years  Assessment and Plan Assessment &  Plan Adult Wellness Visit Routine wellness visit with emphasis on maintaining physical activity despite knee pain and strategies for weight management. - Encourage short spurts of walking and household chores for physical activity. - Advise use of small weights for upper body exercises. - Encourage hydration with a goal of 80 ounces daily, sipping 5 ounces hourly. - Increase protein intake with each meal and snack. - Monitor weight and adjust caloric intake as needed.  Type 2 diabetes mellitus with hyperglycemia Type 2 diabetes with recent hemoglobin A1c of 8.1%. Blood sugar levels fluctuate, with occasional hypoglycemia when meals are skipped. - Check blood sugar levels regularly. - Avoid skipping meals to prevent hypoglycemia. - Order blood work to check sugar, kidneys, cholesterol, thyroid , and blood count. - Check urine sample for kidney function and protein.  Obesity Weight increased from 175 to 179 pounds. Discussed dietary adjustments to manage weight and prevent fat gain due to decreased muscle mass. - Encourage dietary adjustments to increase protein and decrease caloric intake. - Monitor weight and adjust diet as needed.  Osteoarthritis of knee and low back pain with sciatica Knee pain limits walking. Low back pain with sciatica, likely age-related arthritis. Pain improves with movement. - Encourage continued movement and exercises that do not exacerbate pain. - Consider referral to sports medicine or orthopedics if pain becomes debilitating.  History of malignant neoplasm of bilateral breasts Breast cancer with larger resection on the right side. - Administer vaccines in the arm with less resection.  Polyp of colon, unspecified type Recent colonoscopy revealed two small polyps. Pathology showed no high-grade dysplasia, tubular adenoma, hyperplastic polyp. - Await letter confirming five-year follow-up for colonoscopy.  General Health Maintenance Discussed upcoming  vaccinations and routine screenings. Recommended vaccines include flu, shingles, RSV, and COVID-19. Mammogram due later in the year. Pap smear records to be requested from GYN. - Schedule flu shot for mid-September and COVID-19 shot a couple of weeks later. - Administer shingles and RSV vaccines, can be taken together or separately. - Request Pap smear records from GYN. - Mammogram scheduled for later in the year.  Recording duration: 23 minutes     Harlene Horton, MD

## 2024-02-10 LAB — CBC WITH DIFFERENTIAL/PLATELET
Basophils Absolute: 0 K/uL (ref 0.0–0.1)
Basophils Relative: 0.5 % (ref 0.0–3.0)
Eosinophils Absolute: 0.1 K/uL (ref 0.0–0.7)
Eosinophils Relative: 1.8 % (ref 0.0–5.0)
HCT: 38.3 % (ref 36.0–46.0)
Hemoglobin: 12.1 g/dL (ref 12.0–15.0)
Lymphocytes Relative: 24.7 % (ref 12.0–46.0)
Lymphs Abs: 1.7 K/uL (ref 0.7–4.0)
MCHC: 31.5 g/dL (ref 30.0–36.0)
MCV: 72.4 fl — ABNORMAL LOW (ref 78.0–100.0)
Monocytes Absolute: 0.5 K/uL (ref 0.1–1.0)
Monocytes Relative: 8.1 % (ref 3.0–12.0)
Neutro Abs: 4.4 K/uL (ref 1.4–7.7)
Neutrophils Relative %: 64.9 % (ref 43.0–77.0)
Platelets: 274 K/uL (ref 150.0–400.0)
RBC: 5.29 Mil/uL — ABNORMAL HIGH (ref 3.87–5.11)
RDW: 17.2 % — ABNORMAL HIGH (ref 11.5–15.5)
WBC: 6.8 K/uL (ref 4.0–10.5)

## 2024-02-10 LAB — LIPID PANEL
Cholesterol: 159 mg/dL (ref 0–200)
HDL: 44 mg/dL (ref >39.00)
LDL Cholesterol: 56 mg/dL (ref 0–99)
NonHDL: 115.3
Total CHOL/HDL Ratio: 4
Triglycerides: 297 mg/dL — ABNORMAL HIGH (ref 0.0–149.0)
VLDL: 59.4 mg/dL — ABNORMAL HIGH (ref 0.0–40.0)

## 2024-02-10 LAB — COMPREHENSIVE METABOLIC PANEL WITH GFR
ALT: 22 U/L (ref 0–35)
AST: 19 U/L (ref 0–37)
Albumin: 4.2 g/dL (ref 3.5–5.2)
Alkaline Phosphatase: 78 U/L (ref 39–117)
BUN: 22 mg/dL (ref 6–23)
CO2: 24 meq/L (ref 19–32)
Calcium: 9.7 mg/dL (ref 8.4–10.5)
Chloride: 100 meq/L (ref 96–112)
Creatinine, Ser: 1.24 mg/dL — ABNORMAL HIGH (ref 0.40–1.20)
GFR: 43.81 mL/min — ABNORMAL LOW (ref >60.00)
Glucose, Bld: 118 mg/dL — ABNORMAL HIGH (ref 70–99)
Potassium: 4 meq/L (ref 3.5–5.1)
Sodium: 139 meq/L (ref 135–145)
Total Bilirubin: 0.3 mg/dL (ref 0.2–1.2)
Total Protein: 7.1 g/dL (ref 6.0–8.3)

## 2024-02-10 LAB — MICROALBUMIN / CREATININE URINE RATIO
Creatinine,U: 80.3 mg/dL
Microalb Creat Ratio: UNDETERMINED mg/g (ref 0.0–30.0)
Microalb, Ur: <0.7 mg/dL

## 2024-02-10 LAB — TSH: TSH: 0.97 u[IU]/mL (ref 0.35–5.50)

## 2024-02-12 ENCOUNTER — Ambulatory Visit: Payer: Self-pay | Admitting: Family Medicine

## 2024-02-12 DIAGNOSIS — E782 Mixed hyperlipidemia: Secondary | ICD-10-CM

## 2024-02-14 ENCOUNTER — Other Ambulatory Visit: Payer: Self-pay

## 2024-02-14 ENCOUNTER — Other Ambulatory Visit: Payer: Self-pay | Admitting: Family Medicine

## 2024-02-14 ENCOUNTER — Other Ambulatory Visit (HOSPITAL_BASED_OUTPATIENT_CLINIC_OR_DEPARTMENT_OTHER): Payer: Self-pay

## 2024-02-14 MED ORDER — BISOPROLOL-HYDROCHLOROTHIAZIDE 5-6.25 MG PO TABS
1.0000 | ORAL_TABLET | Freq: Every day | ORAL | 1 refills | Status: AC
  Filled 2024-02-14 – 2024-03-13 (×2): qty 90, 90d supply, fill #0
  Filled 2024-06-13: qty 90, 90d supply, fill #1

## 2024-02-14 MED ORDER — ROSUVASTATIN CALCIUM 10 MG PO TABS
10.0000 mg | ORAL_TABLET | Freq: Every day | ORAL | 1 refills | Status: DC
  Filled 2024-02-14: qty 90, 90d supply, fill #0
  Filled 2024-05-24: qty 90, 90d supply, fill #1

## 2024-02-14 NOTE — Progress Notes (Signed)
 Called patient and no answer left vm to return call

## 2024-02-18 ENCOUNTER — Emergency Department (HOSPITAL_BASED_OUTPATIENT_CLINIC_OR_DEPARTMENT_OTHER)
Admission: EM | Admit: 2024-02-18 | Discharge: 2024-02-18 | Disposition: A | Discharge status: Home / Self Care | Attending: Emergency Medicine | Admitting: Emergency Medicine

## 2024-02-18 ENCOUNTER — Other Ambulatory Visit (HOSPITAL_BASED_OUTPATIENT_CLINIC_OR_DEPARTMENT_OTHER): Payer: Self-pay

## 2024-02-18 ENCOUNTER — Encounter (HOSPITAL_BASED_OUTPATIENT_CLINIC_OR_DEPARTMENT_OTHER): Payer: Self-pay | Admitting: Emergency Medicine

## 2024-02-18 DIAGNOSIS — M5481 Occipital neuralgia: Secondary | ICD-10-CM | POA: Diagnosis not present

## 2024-02-18 DIAGNOSIS — M542 Cervicalgia: Secondary | ICD-10-CM | POA: Insufficient documentation

## 2024-02-18 MED ORDER — HYDROCODONE-ACETAMINOPHEN 5-325 MG PO TABS
1.0000 | ORAL_TABLET | Freq: Once | ORAL | Status: AC
  Administered 2024-02-18: 1 via ORAL
  Filled 2024-02-18: qty 1

## 2024-02-18 MED ORDER — LIDOCAINE 5 % EX PTCH
1.0000 | MEDICATED_PATCH | CUTANEOUS | 0 refills | Status: DC
  Filled 2024-02-18: qty 30, 30d supply, fill #0

## 2024-02-18 MED ORDER — LIDOCAINE 5 % EX PTCH
1.0000 | MEDICATED_PATCH | CUTANEOUS | Status: DC
  Administered 2024-02-18: 1 via TRANSDERMAL
  Filled 2024-02-18: qty 1

## 2024-02-18 MED ORDER — CYCLOBENZAPRINE HCL 5 MG PO TABS
5.0000 mg | ORAL_TABLET | Freq: Once | ORAL | Status: AC
  Administered 2024-02-18: 5 mg via ORAL
  Filled 2024-02-18: qty 1

## 2024-02-18 MED ORDER — NAPROXEN 250 MG PO TABS
500.0000 mg | ORAL_TABLET | Freq: Once | ORAL | Status: AC
  Administered 2024-02-18: 500 mg via ORAL
  Filled 2024-02-18: qty 2

## 2024-02-18 MED ORDER — BUPIVACAINE HCL 0.5 % IJ SOLN
20.0000 mL | Freq: Once | INTRAMUSCULAR | Status: DC
  Filled 2024-02-18: qty 1

## 2024-02-18 NOTE — ED Provider Notes (Signed)
 [place] EMERGENCY DEPARTMENT AT [facility] Provider Note   CSN: [id number] Arrival date & time: 02/18/24  1218     Patient presents with: Neck Pain   [name] is a 73 y.o. female.    Neck Pain    73 year old female presenting to the emergency department with right-sided neck pain that radiates to her forehead.  Patient states that she woke up with pain and associated spasm.  Pain started on Friday morning.  She denies any trauma to her head or neck.  She endorses pain with twisting and range of motion.  She denies any fevers or chills.  She denies any numbness or weakness in any of her extremities, no saddle anesthesia, urine no urinary or fecal incontinence.  Prior to Admission medications   Medication Sig Start Date End Date Taking? Authorizing Provider  albuterol  (VENTOLIN  HFA) 108 (90 Base) MCG/ACT inhaler Inhale 2 puffs by mouth into the lungs every 6 (six) hours as needed for wheezing or shortness of breath. 07/21/22   Domenica Harlene LABOR, MD  ASPIR-LOW 81 MG EC tablet TAKE 1 TABLET BY MOUTH DAILY 01/16/15   Domenica Harlene LABOR, MD  azelastine  (ASTELIN ) 0.1 % nasal spray Place 1-2 sprays into both nostrils 2 (two) times daily as needed for rhinitis. 11/14/23   Domenica Harlene LABOR, MD  bisoprolol -hydrochlorothiazide  (ZIAC ) 5-6.25 MG tablet Take 1 tablet by mouth daily. 02/14/24   Domenica Harlene LABOR, MD  cetirizine  (ZYRTEC ) 10 MG tablet Take 1 tablet (10 mg total) by mouth daily as needed for allergies or rhinitis. 06/07/23   Domenica Harlene LABOR, MD  Continuous Glucose Receiver (FREESTYLE LIBRE 3 READER) DEVI Use as directed to check blood glucose continuously. 02/21/23   Domenica Harlene LABOR, MD  Continuous Glucose Sensor (FREESTYLE LIBRE 3 SENSOR) MISC Inject 1 sensor into the skin every 14 (fourteen) days. Use to check blood glucose continuously. 01/31/24   Domenica Harlene LABOR, MD  empagliflozin  (JARDIANCE ) 25 MG TABS tablet Take 1 tablet (25 mg total) by mouth daily before breakfast. 12/20/23    Shamleffer, Ibtehal Jaralla, MD  famotidine  (PEPCID ) 20 MG tablet Take 1 tablet (20 mg total) by mouth at bedtime. 01/31/24   Zehr, Jessica D, PA-C  fluticasone  (FLONASE ) 50 MCG/ACT nasal spray Place 2 sprays into both nostrils daily. 08/30/23   Domenica Harlene LABOR, MD  glimepiride  (AMARYL ) 2 MG tablet Take 1 tablet (2 mg total) by mouth daily before breakfast. 12/20/23   Shamleffer, Ibtehal Jaralla, MD  glucose blood (ONETOUCH VERIO) test strip Use as directed to test blood sugar 2 (two) times daily. 08/04/23   Shamleffer, Ibtehal Jaralla, MD  Magnesium  Oxide 400 MG CAPS Take 1 capsule (400 mg total) by mouth daily. 06/04/21   Shlomo Wilbert SAUNDERS, MD  metFORMIN  (GLUCOPHAGE ) 1000 MG tablet Take 1 tablet (1,000 mg total) by mouth 2 (two) times daily with a meal. 12/20/23 12/19/24  Shamleffer, Donell Cardinal, MD  omeprazole  (PRILOSEC) 40 MG capsule Take 1 capsule (40 mg total) by mouth 2 (two) times daily. 01/31/24   Zehr, Jessica D, PA-C  rosuvastatin  (CRESTOR ) 10 MG tablet Take 1 tablet (10 mg total) by mouth daily. 02/14/24   Domenica Harlene LABOR, MD    Allergies: Atorvastatin , Pravastatin , Amoxicillin, and Codeine    Review of Systems  Musculoskeletal:  Positive for neck pain.  All other systems reviewed and are negative.   Updated Vital Signs BP (!) 140/80 (BP Location: Right Arm)   Pulse 87   Temp 98 F (36.7 C)  Resp 18   SpO2 98%   Physical Exam Vitals and nursing note reviewed.  Constitutional:      General: She is not in acute distress. HENT:     Head: Normocephalic and atraumatic.  Eyes:     Conjunctiva/sclera: Conjunctivae normal.     Pupils: Pupils are equal, round, and reactive to light.  Neck:     Comments: Passive range of motion intact without pain, no meningismus, tenderness about the occipital nerve at the base of the skull on the right, no midline tenderness of the neck, negative Spurling sign Cardiovascular:     Rate and Rhythm: Normal rate and regular rhythm.  Pulmonary:      Effort: Pulmonary effort is normal. No respiratory distress.  Abdominal:     General: There is no distension.     Tenderness: There is no guarding.  Musculoskeletal:        General: No deformity or signs of injury.     Cervical back: Neck supple.  Skin:    Findings: No lesion or rash.     Comments: 5 out of 5 strength in all 4 extremities, intact sensation to light touch  Neurological:     General: No focal deficit present.     Mental Status: She is alert. Mental status is at baseline.     (all labs ordered are listed, but only abnormal results are displayed) Labs Reviewed - No data to display  EKG: None  Radiology: No results found.   Arturo Block  Date/Time: 02/18/2024 3:26 PM  Performed by: Jerrol Agent, MD Authorized by: Jerrol Agent, MD   Consent:    Consent obtained:  Verbal   Consent given by:  Patient   Risks discussed:  Allergic reaction, infection and unsuccessful block   Alternatives discussed:  No treatment and alternative treatment Universal protocol:    Patient identity confirmed:  Verbally with patient and arm band Indications:    Indications:  Pain relief Location:    Body area:  Head   Head nerve:  Occipital   Laterality:  Right Pre-procedure details:    Skin preparation:  Chlorhexidine    Preparation: Patient was prepped and draped in usual sterile fashion   Procedure details:    Block needle gauge:  25 G   Anesthetic injected:  Bupivacaine  0.5% w/o epi   Steroid injected:  None   Injection procedure:  Anatomic landmarks identified, anatomic landmarks palpated, incremental injection, introduced needle and negative aspiration for blood   Paresthesia:  None Post-procedure details:    Dressing:  None   Outcome:  Pain improved   Procedure completion:  Tolerated    Medications Ordered in the ED  lidocaine  (LIDODERM ) 5 % 1 patch (1 patch Transdermal Patch Applied 02/18/24 1350)  bupivacaine  (MARCAINE ) 0.5 % (with pres) injection 20 mL (has no  administration in time range)  cyclobenzaprine  (FLEXERIL ) tablet 5 mg (5 mg Oral Given 02/18/24 1349)  HYDROcodone -acetaminophen  (NORCO/VICODIN) 5-325 MG per tablet 1 tablet (1 tablet Oral Given 02/18/24 1350)  naproxen  (NAPROSYN ) tablet 500 mg (500 mg Oral Given 02/18/24 1350)                                    Medical Decision Making Risk Prescription drug management.    73 year old female presenting to the emergency department with right-sided neck pain that radiates to her forehead.  Patient states that she woke up with pain and associated spasm.  Pain started on Friday morning.  She denies any trauma to her head or neck.  She endorses pain with twisting and range of motion.  She denies any fevers or chills.  She denies any numbness or weakness in any of her extremities, no saddle anesthesia, urine no urinary or fecal incontinence.  On arrival, the patient was vitally stable, afebrile, not tachycardic or tachypneic.  Patient was tenderness about the occipital nerve root on the right, presenting with symptoms consistent with likely occipital neuralgia.  Additionally considered disc disease in the cervical spine, spinal stenosis.  Patient with negative Spurling sign, neurologically intact with intact strength and sensation in all the extremities and no red flag symptoms to suggest acute spinal cord compression with no saddle anesthesia, no urinary or fecal incontinence.  The patient denies any recent trauma.  Pain is worse with twisting suggesting musculoskeletal component and spasm however also is radiating from her occipital nerve root to her forehead consistent with occipital neuralgia.  Nerve block was performed with bupivacaine  as per the procedure note above with subsequent near complete resolution of patient's symptoms.  Advised continued outpatient management, stable for discharge, recommended Tylenol  for pain control, lidocaine  patch over-the-counter.  Stable for outpatient follow-up with a  primary care provider.  Return precautions provided in the event of any concerning red flag symptoms.     Final diagnoses:  Neck pain  Occipital neuralgia of right side    ED Discharge Orders          Ordered    lidocaine  (LIDODERM ) 5 %  Every 24 hours,   Status:  Discontinued        02/18/24 1526               Jerrol Agent, MD 02/18/24 1527

## 2024-02-18 NOTE — Discharge Instructions (Addendum)
 Your symptoms are consistent with occipital neuralgia and improved after a nerve block.  Continue to take Tylenol  and Motrin for pain control, can also try an over-the-counter lidocaine  patch, follow-up with your primary care provider.  Return for any fevers, neck rigidity, numbness or weakness in the extremities, urinary or fecal incontinence, new falls or trauma.

## 2024-02-18 NOTE — ED Triage Notes (Signed)
 C/o neck spasm and pain that rads into top of head. States pain started Friday morning. Denies fevers.

## 2024-02-20 ENCOUNTER — Ambulatory Visit: Payer: Self-pay

## 2024-02-20 NOTE — Telephone Encounter (Signed)
 FYI Only or Action Required?: Action required by provider: request for appointment and needs HFU, but no PCP access.  Patient was last seen in primary care on 02/09/2024 by [name] LABOR, MD.  Called Nurse Triage reporting Hospitalization Follow-up and Neck Pain.  Symptoms began several days ago.  Interventions attempted: OTC medications: tylenol .  Symptoms are: unchanged.  Triage Disposition: Home Care  Patient/caregiver understands and will follow disposition?: Yes      Copied from CRM [id number]. Topic: Clinical - Red Word Triage >> Feb 20, 2024 10:09 AM [name] wrote: Red Word that prompted transfer to Nurse Triage: Hospital follow up - severe neck pain (dx = occipital neuralgia on right side) .[name] Still having neck pain Reason for Disposition  [1] Condition / symptoms BETTER (improving) AND [2] caller has additional questions triager can answer  Answer Assessment - Initial Assessment Questions 1. MAIN CONCERN OR SYMPTOM:  What is your main concern right now? What question do you have? What's the main symptom you're worried about? (e.g., breathing difficulty, ankle swelling, weight gain.)     Neck pain 2. ONSET: When did the  neck pain  start?     Saturday 3. BETTER-SAME-WORSE: Are you getting better, staying the same, or getting worse compared to the day you were discharged?     better 4. HOSPITALIZATION: How long were you hospitalized? (e.g., days)     D/C same day 5. DISCHARGE DIAGNOSIS:  What problem or disease were you hospitalized for?     occipital neuralgia 6. DISCHARGE DATE: What date were you discharged from the hospital?     02/18/2024 7. DISCHARGE DOCTOR: Who is the main doctor taking care of you now?     See chart 8. DISCHARGE APPOINTMENT: Have you scheduled a follow-up discharge appointment with your doctor?     Calling to schedule now 9. DISCHARGE MEDICINES: Did the doctor (or NP/PA) who discharged you order any new medicines for you to  use? If yes, have you filled the prescription and started taking the medicine?      denies Endorses getting a shot in ED that provided relief for a few hours 10. PAIN: Is there any pain? If Yes, ask: How bad is it?  (Scale 0-10; or none, mild, moderate, severe)       7/10 Endorses taking tylenol  with minimal relief 11. FEVER: Do you have a fever? If Yes, ask: What is it, how was it measured  and when did it start?       denies 12. OTHER SYMPTOMS: Do you have any other symptoms?       denies    Triager attempted to schedule HFU with PCP, but no access. Triager will forward encounter for Dr Christine 's office to review and advise. Patient verbalized understanding and is expecting call back from office for HFU appt  Protocols used: Post-Hospitalization Follow-up Call-A-AH

## 2024-02-24 DIAGNOSIS — E119 Type 2 diabetes mellitus without complications: Secondary | ICD-10-CM | POA: Diagnosis not present

## 2024-02-24 LAB — HM DIABETES EYE EXAM

## 2024-02-27 DIAGNOSIS — K219 Gastro-esophageal reflux disease without esophagitis: Secondary | ICD-10-CM | POA: Diagnosis not present

## 2024-02-27 DIAGNOSIS — N958 Other specified menopausal and perimenopausal disorders: Secondary | ICD-10-CM | POA: Diagnosis not present

## 2024-02-27 LAB — HM DEXA SCAN: HM Dexa Scan: NORMAL

## 2024-02-29 ENCOUNTER — Encounter: Payer: Self-pay | Admitting: *Deleted

## 2024-03-05 ENCOUNTER — Other Ambulatory Visit: Payer: Self-pay

## 2024-03-05 ENCOUNTER — Other Ambulatory Visit (HOSPITAL_BASED_OUTPATIENT_CLINIC_OR_DEPARTMENT_OTHER): Payer: Self-pay

## 2024-03-13 ENCOUNTER — Other Ambulatory Visit (HOSPITAL_BASED_OUTPATIENT_CLINIC_OR_DEPARTMENT_OTHER): Payer: Self-pay

## 2024-03-21 ENCOUNTER — Telehealth: Payer: Self-pay

## 2024-03-21 ENCOUNTER — Encounter: Payer: Self-pay | Admitting: Internal Medicine

## 2024-03-21 ENCOUNTER — Ambulatory Visit: Admitting: Internal Medicine

## 2024-03-21 ENCOUNTER — Other Ambulatory Visit (HOSPITAL_BASED_OUTPATIENT_CLINIC_OR_DEPARTMENT_OTHER): Payer: Self-pay

## 2024-03-21 ENCOUNTER — Other Ambulatory Visit (HOSPITAL_COMMUNITY): Payer: Self-pay

## 2024-03-21 VITALS — BP 130/74 | Ht 62.0 in | Wt 181.0 lb

## 2024-03-21 DIAGNOSIS — E1165 Type 2 diabetes mellitus with hyperglycemia: Secondary | ICD-10-CM

## 2024-03-21 DIAGNOSIS — Z7984 Long term (current) use of oral hypoglycemic drugs: Secondary | ICD-10-CM | POA: Diagnosis not present

## 2024-03-21 DIAGNOSIS — E785 Hyperlipidemia, unspecified: Secondary | ICD-10-CM

## 2024-03-21 LAB — POCT GLYCOSYLATED HEMOGLOBIN (HGB A1C): Hemoglobin A1C: 7.3 % — AB (ref 4.0–5.6)

## 2024-03-21 MED ORDER — METFORMIN HCL ER 500 MG PO TB24
500.0000 mg | ORAL_TABLET | Freq: Two times a day (BID) | ORAL | 3 refills | Status: DC
  Filled 2024-03-21: qty 180, 90d supply, fill #0
  Filled 2024-06-21: qty 180, 90d supply, fill #1

## 2024-03-21 MED ORDER — TIRZEPATIDE 2.5 MG/0.5ML ~~LOC~~ SOAJ: 2.5000 mg | SUBCUTANEOUS | Status: DC

## 2024-03-21 MED ORDER — TIRZEPATIDE 5 MG/0.5ML ~~LOC~~ SOAJ
5.0000 mg | SUBCUTANEOUS | 3 refills | Status: DC
  Filled 2024-03-21: qty 6, 84d supply, fill #0
  Filled 2024-06-13: qty 6, 84d supply, fill #1

## 2024-03-21 MED ORDER — GLIMEPIRIDE 2 MG PO TABS: 1.0000 mg | ORAL_TABLET | Freq: Every day | ORAL | Status: DC

## 2024-03-21 NOTE — Telephone Encounter (Signed)
 Pharmacy Patient Advocate Encounter   Received notification from CoverMyMeds that prior authorization for Mounjaro 5MG /0.5ML auto-injectors is required/requested.   Insurance verification completed.   The patient is insured through [facility] [employer] ADVANTAGE/RX ADVANCE.   Per test claim: PA required and submitted KEY/EOC/Request #: [id number] from 03/21/24 to 03/21/25 PA#: [id number]

## 2024-03-21 NOTE — Patient Instructions (Addendum)
-   Decrease Glimepiride  2 mg,HALF a tablet before breakfast  - Continue Jardiance  25  mg, 1 tablet every morning  - Decrease Metformin  500 mg ,1  tablet with breakfast and 1 tablet with dinner - Start Mounjaro 2.5 mg once weekly for 4 weeks, then pick up the prescription of Mounjaro 5 mg weekly     HOW TO TREAT LOW BLOOD SUGARS (Blood sugar LESS THAN 70 MG/DL) Please follow the RULE OF 15 for the treatment of hypoglycemia treatment (when your (blood sugars are less than 70 mg/dL)   STEP 1: Take 15 grams of carbohydrates when your blood sugar is low, which includes:  3-4 GLUCOSE TABS  OR 3-4 OZ OF JUICE OR REGULAR SODA OR ONE TUBE OF GLUCOSE GEL    STEP 2: RECHECK blood sugar in 15 MINUTES STEP 3: If your blood sugar is still low at the 15 minute recheck --> then, go back to STEP 1 and treat AGAIN with another 15 grams of carbohydrates.

## 2024-03-21 NOTE — Progress Notes (Unsigned)
 Name: [name]  Age/ Sex: 73 y.o., female   MRN/ DOB: [id number], 1951/01/24     PCP: Domenica Harlene LABOR, MD   Reason for Endocrinology Evaluation: Type 2 Diabetes Mellitus  Initial Endocrine Consultative Visit: 02/22/2017    PATIENT IDENTIFIER: [name] is a 73 y.o. female with a past medical history of T2DM, HTN , Dyslipidemia, Hx of breast Ca ( S/P right lumpectomy) . The patient has followed with Endocrinology clinic since 02/16/2019 for consultative assistance with management of her diabetes.     DIABETIC HISTORY:  [name] was diagnosed with T2DM in 2001. She has been on metformin , Glimepiride  and Januvia . Invokana  added in 2018 but was cost prohibitive. Her hemoglobin A1c has ranged from 7.0% in 2016, peaking at 8.8% in  2021.    She used to see Dr.Gherghe but was lost to follow up by 2019 , until her return to our clinic 08/2019.    Started Rybelsus  08/2020 but this had to be stopped by the end of 2022 due to inability to qualify for pt assistance   Switched glipizide  to repaglinide  01/2022 Started Ozempic  07/2022  Stopped repaglinide  12/2022, with an A1c of 7.1%  Discontinued Ozempic  and started Jardiance  due to GI side effects to Ozempic  06/2023   Started glimepiride  in July, 2025 with an A1c of 8.1%  SUBJECTIVE:   During the last visit (12/20/2023):  A1c  8.1 %     Today (03/21/2024): [name] is here for a follow up on diabetes management.  She checks her blood sugars multiple times daliy through freestyle libre    No recent nausea or vomiting  No diarrhea but has occasional constipation  Has joint pains     HOME DIABETES REGIMEN:  Metformin  1000 mg, BID Glimepiride  2 mg daily Jardiance  25 mg daily     Statin:  Crestor  ACE-I/ARB: No   CONTINUOUS GLUCOSE MONITORING RECORD INTERPRETATION    Dates of Recording: 9/21-10/09/2023  Sensor description:freestyle  Results statistics:   CGM use % of time 81  Average and SD  122/32.1  Time in range 85%  % Time Above 180 8  % Time above 250 1  % Time Below target 5   Glycemic patterns summary: BGs are optimal throughout the day and night Hyperglycemic episodes postprandial  Hypoglycemic episodes occurred at variable times  Overnight periods: Optimal   DIABETIC COMPLICATIONS: Microvascular complications:   Denies: CKD, retinopathy , neuropathy Last Eye Exam: Completed 02/24/2024  Macrovascular complications:   Denies: CAD, CVA, PVD   HISTORY:  Past Medical History:  Past Medical History:  Diagnosis Date   Acute bronchitis 10/31/2015   Allergy    seasonal   Arthritis    knee-left   Cancer (HCC) 04/2018   right breast ca   Cataract    right eye   Diabetes mellitus 52   type 2   Diaphragmatic hernia without mention of obstruction or gangrene    Endometriosis    Esophageal reflux    Esophageal stricture    GERD (gastroesophageal reflux disease)    HOCM (hypertrophic obstructive cardiomyopathy) (HCC)    Basal septal hypertrophy 16 mm with increased ECV at 30% on cardiac MRI 06/2021 consistent with HOCM   Hyperlipidemia    Hyperplastic colon polyp    Hypertension 52   MI (myocardial infarction) (HCC)    per pt   Nonalcoholic fatty liver disease 04/28/2010   Qualifier: Diagnosis of  By: Mavis MD, Norleen BRAVO  Nonspecific abnormal results of liver function study    NSVT (nonsustained ventricular tachycardia) (HCC)    noted on event monitor 05/2021   Personal history of radiation therapy 08/2018   bilateral breasts   Pharyngitis 02/18/2012   Plantar fascial fibromatosis    Preventative health care 01/22/2013   Seasonal allergies 10/31/2015   Sleep apnea    has oral appliance but does not fit well due to missing teeth   Sore throat 04/03/2017   Tubular adenoma of colon    Unspecified sleep apnea    no c-pap   Past Surgical History:  Past Surgical History:  Procedure Laterality Date   ABDOMINAL HYSTERECTOMY  1982   partial    APPENDECTOMY     benigh cyst in lung  2006   right   BREAST BIOPSY Bilateral 04/03/2018   BREAST BIOPSY Bilateral 03/23/2018   BREAST CYST INCISION AND DRAINAGE     right breast   BREAST EXCISIONAL BIOPSY Right 2019   BREAST LUMPECTOMY Bilateral 04/2018   BREAST LUMPECTOMY WITH RADIOACTIVE SEED LOCALIZATION Bilateral 05/02/2018   Procedure: RIGHT BREAST SEED LUMPECTOMY X2 AND LEFT BREAST SEED GUIDED LUMPECTOMY;  Surgeon: Aron Shoulders, MD;  Location: [facility];  Service: General;  Laterality: Bilateral;   CATARACT EXTRACTION     right eye   HERNIA REPAIR     per pt, she is not aware of this surgery   RE-EXCISION OF BREAST LUMPECTOMY Right 05/25/2018   Procedure: RE-EXCISION OF RIGHT BREAST LUMPECTOMY;  Surgeon: Aron Shoulders, MD;  Location: [facility];  Service: General;  Laterality: Right;   seba     TUBAL LIGATION     Social History:  reports that she has never smoked. She has never used smokeless tobacco. She reports that she does not drink alcohol and does not use drugs. Family History:  Family History  Problem Relation Age of Onset   Breast cancer Mother    Hypertension Mother    Diabetes Mother        type 2   Other Mother        esophagus surgery   Heart disease Father    Hyperlipidemia Father    Hypertension Father    Heart attack Father        MI at age 64   Hypertension Sister    Diabetes Sister    Diabetes Brother        type 2   Hypertension Brother    Heart disease Maternal Grandmother    Heart attack Maternal Grandfather    Hypertension Maternal Grandfather    Stroke Paternal Grandmother    Alcohol abuse Maternal Aunt    Cancer Maternal Aunt    Colon cancer Neg Hx    Esophageal cancer Neg Hx    Liver disease Neg Hx      HOME MEDICATIONS: Allergies as of 03/21/2024       Reactions   Atorvastatin  Other (See Comments)   Myalgia, fatigue, memory loss   Pravastatin  Other (See Comments)   Myalgia, fatigue, memory loss    Amoxicillin Itching   REACTION: rash   Codeine Nausea And Vomiting        Medication List        Accurate as of March 21, 2024 10:45 AM. If you have any questions, ask your nurse or doctor.          albuterol  108 (90 Base) MCG/ACT inhaler Commonly known as: VENTOLIN  HFA Inhale 2 puffs by mouth into  the lungs every 6 (six) hours as needed for wheezing or shortness of breath.   Aspir-Low 81 MG tablet Generic drug: aspirin  EC TAKE 1 TABLET BY MOUTH DAILY   Azelastine  HCl 137 MCG/SPRAY Soln Place 1-2 sprays into both nostrils 2 (two) times daily as needed for rhinitis.   bisoprolol -hydrochlorothiazide  5-6.25 MG tablet Commonly known as: ZIAC  Take 1 tablet by mouth daily.   cetirizine  10 MG tablet Commonly known as: ZYRTEC  Take 1 tablet (10 mg total) by mouth daily as needed for allergies or rhinitis.   famotidine  20 MG tablet Commonly known as: PEPCID  Take 1 tablet (20 mg total) by mouth at bedtime.   fluticasone  50 MCG/ACT nasal spray Commonly known as: FLONASE  Place 2 sprays into both nostrils daily.   FreeStyle Libre 3 [place] Use as directed to check blood glucose continuously.   FreeStyle Libre 3 Sensor Misc Inject 1 sensor into the skin every 14 (fourteen) days. Use to check blood glucose continuously.   glimepiride  2 MG tablet Commonly known as: AMARYL  Take 1 tablet (2 mg total) by mouth daily before breakfast.   Jardiance  25 MG Tabs tablet Generic drug: empagliflozin  Take 1 tablet (25 mg total) by mouth daily before breakfast.   Magnesium  Oxide 400 MG Caps Take 1 capsule (400 mg total) by mouth daily.   metFORMIN  1000 MG tablet Commonly known as: GLUCOPHAGE  Take 1 tablet (1,000 mg total) by mouth 2 (two) times daily with a meal.   omeprazole  40 MG capsule Commonly known as: PRILOSEC Take 1 capsule (40 mg total) by mouth 2 (two) times daily.   OneTouch Verio test strip Generic drug: glucose blood Use as directed to test blood sugar 2  (two) times daily.   rosuvastatin  10 MG tablet Commonly known as: Crestor  Take 1 tablet (10 mg total) by mouth daily.         OBJECTIVE:   Vital Signs: BP 130/74 (BP Location: Left Arm, Patient Position: Sitting, Cuff Size: Large)   Ht 5' 2 (1.575 m)   Wt 181 lb (82.1 kg)   BMI 33.11 kg/m      Exam: General: Pt appears well and is in NAD  Lungs: Clear with good BS bilat  Heart: RRR   Abdomen: soft, nontender  Extremities: No pretibial edema.   Neuro: MS is good with appropriate affect, pt is alert and Ox3       DM foot exam 07/13/2023  The skin of the feet is intact without sores or ulcerations. The pedal pulses are 2+ on right and 2+ on left. The sensation is intact to a screening 5.07, 10 gram monofilament bilaterally  DATA REVIEWED:  Lab Results  Component Value Date   HGBA1C 7.3 (A) 03/21/2024   HGBA1C 8.1 (A) 12/20/2023   HGBA1C 7.4 (H) 06/28/2023    Latest Reference Range & Units 02/09/24 15:14  Sodium 135 - 145 mEq/L 139  Potassium 3.5 - 5.1 mEq/L 4.0  Chloride 96 - 112 mEq/L 100  CO2 19 - 32 mEq/L 24  Glucose 70 - 99 mg/dL 881 (H)  BUN 6 - 23 mg/dL 22  Creatinine 9.59 - 8.79 mg/dL 8.75 (H)  Calcium  8.4 - 10.5 mg/dL 9.7  Alkaline Phosphatase 39 - 117 U/L 78  Albumin 3.5 - 5.2 g/dL 4.2  AST 0 - 37 U/L 19  ALT 0 - 35 U/L 22  Total Protein 6.0 - 8.3 g/dL 7.1  Total Bilirubin 0.2 - 1.2 mg/dL 0.3  GFR >39.99 mL/min 43.81 (L)  Latest Reference Range & Units 02/09/24 15:14  Total CHOL/HDL Ratio  4  Cholesterol 0 - 200 mg/dL 840  HDL Cholesterol >60.99 mg/dL 55.99  LDL (calc) 0 - 99 mg/dL 56  MICROALB/CREAT RATIO 0.0 - 30.0 mg/g Unable to calculate  NonHDL  115.30  Triglycerides 0.0 - 149.0 mg/dL 702.9 (H)  VLDL 0.0 - 59.9 mg/dL 40.5 (H)  (H): Data is abnormally high    ASSESSMENT / PLAN / RECOMMENDATIONS:   1) Type 2 Diabetes Mellitus, suboptimally controlled, Without complications - Most recent A1c of 7.3 %. Goal A1c < 7.0 %.     -  A1c has improved from 8.1% to 7.3% -Intolerant to Ozempic  due to multiple GI side effects - I have recommended starting Mounjaro and decreasing glimepiride  by 50%, patient encouraged to contact our office with hypoglycemic episodes so we can discontinue her glimepiride  - She was provided with #4 Mounjaro 2.5 mg pens - Will decrease metformin  by 50% due to a GFR <45  MEDICATIONS:  -Start Mounjaro 2.5 mg weekly for 4 weeks, then increase to 5 mg weekly -Decrease glimepiride  2 mg, half tablet before breakfast -Continue Jardiance  25 mg daily  - Stop metformin  1000 mg twice daily - Start metformin  500 mg XR, twice daily   EDUCATION / INSTRUCTIONS: BG monitoring instructions: Patient is instructed to check her blood sugars 2 times a day, fasting and supper time. Call [facility] if: BG persistently < 70  I reviewed the Rule of 15 for the treatment of hypoglycemia in detail with the patient. Literature supplied.    2) Diabetic complications:  Eye: Does not have known diabetic retinopathy.  Neuro/ Feet: Does not have known diabetic peripheral neuropathy .  Renal: Patient does not have known baseline CKD. She   is not on an ACEI/ARB at present.  MA/CR.normal   3) Dyslipidemia:   -LDL at goal with most recent lipid panel through PCPs office -Continue atorvastatin  5 mg daily    F/U in 3 months   Signed electronically by: Stefano Redgie Butts, MD  [facility]  [facility] [address] [place] [place], [place] [address] Phone: [phone] FAX: [phone]   CC: Domenica Harlene LABOR, MD [address] [place] [address] Phone: [phone]  Fax: [phone]  Return to Endocrinology clinic as below: Future Appointments  Date Time Provider Department Center  03/21/2024 10:50 AM Lujuana Kapler, Donell Redgie, MD [facility] None  05/14/2024  9:15 AM [facility] [address]  05/22/2024  1:30 PM Orman Erminio POUR, PA-C [facility] None  08/13/2024  9:00 AM Domenica Harlene LABOR, MD [facility]  11/01/2024 11:30 AM [facility] VISIT 2 [facility] [address]

## 2024-03-22 ENCOUNTER — Other Ambulatory Visit (HOSPITAL_BASED_OUTPATIENT_CLINIC_OR_DEPARTMENT_OTHER): Payer: Self-pay

## 2024-04-11 ENCOUNTER — Ambulatory Visit: Payer: Self-pay | Admitting: Gastroenterology

## 2024-04-13 ENCOUNTER — Other Ambulatory Visit: Payer: Self-pay | Admitting: Family Medicine

## 2024-04-13 DIAGNOSIS — Z1231 Encounter for screening mammogram for malignant neoplasm of breast: Secondary | ICD-10-CM

## 2024-04-20 DIAGNOSIS — Z6832 Body mass index (BMI) 32.0-32.9, adult: Secondary | ICD-10-CM | POA: Diagnosis not present

## 2024-04-20 DIAGNOSIS — D0510 Intraductal carcinoma in situ of unspecified breast: Secondary | ICD-10-CM | POA: Diagnosis not present

## 2024-04-20 DIAGNOSIS — D649 Anemia, unspecified: Secondary | ICD-10-CM | POA: Diagnosis not present

## 2024-04-20 DIAGNOSIS — Z01419 Encounter for gynecological examination (general) (routine) without abnormal findings: Secondary | ICD-10-CM | POA: Diagnosis not present

## 2024-04-20 DIAGNOSIS — N898 Other specified noninflammatory disorders of vagina: Secondary | ICD-10-CM | POA: Diagnosis not present

## 2024-05-04 ENCOUNTER — Other Ambulatory Visit: Payer: Self-pay

## 2024-05-04 ENCOUNTER — Other Ambulatory Visit: Payer: Self-pay | Admitting: Family Medicine

## 2024-05-04 ENCOUNTER — Other Ambulatory Visit (HOSPITAL_BASED_OUTPATIENT_CLINIC_OR_DEPARTMENT_OTHER): Payer: Self-pay

## 2024-05-04 DIAGNOSIS — J069 Acute upper respiratory infection, unspecified: Secondary | ICD-10-CM

## 2024-05-04 MED ORDER — ALBUTEROL SULFATE HFA 108 (90 BASE) MCG/ACT IN AERS
2.0000 | INHALATION_SPRAY | Freq: Four times a day (QID) | RESPIRATORY_TRACT | 0 refills | Status: AC | PRN
  Filled 2024-05-04: qty 6.7, 25d supply, fill #0

## 2024-05-14 ENCOUNTER — Other Ambulatory Visit (INDEPENDENT_AMBULATORY_CARE_PROVIDER_SITE_OTHER)

## 2024-05-14 ENCOUNTER — Ambulatory Visit: Payer: Self-pay | Admitting: Family Medicine

## 2024-05-14 ENCOUNTER — Ambulatory Visit
Admission: RE | Admit: 2024-05-14 | Discharge: 2024-05-14 | Disposition: A | Source: Ambulatory Visit | Attending: Family Medicine | Admitting: Family Medicine

## 2024-05-14 DIAGNOSIS — E782 Mixed hyperlipidemia: Secondary | ICD-10-CM | POA: Diagnosis not present

## 2024-05-14 DIAGNOSIS — Z1231 Encounter for screening mammogram for malignant neoplasm of breast: Secondary | ICD-10-CM

## 2024-05-14 LAB — LIPID PANEL
Cholesterol: 131 mg/dL (ref 0–200)
HDL: 41.5 mg/dL (ref >39.00)
LDL Cholesterol: 73 mg/dL (ref 0–99)
NonHDL: 89.46
Total CHOL/HDL Ratio: 3
Triglycerides: 83 mg/dL (ref 0.0–149.0)
VLDL: 16.6 mg/dL (ref 0.0–40.0)

## 2024-05-22 ENCOUNTER — Ambulatory Visit: Admitting: Physician Assistant

## 2024-05-22 ENCOUNTER — Encounter: Payer: Self-pay | Admitting: Physician Assistant

## 2024-05-22 VITALS — BP 98/65

## 2024-05-22 DIAGNOSIS — L821 Other seborrheic keratosis: Secondary | ICD-10-CM | POA: Diagnosis not present

## 2024-05-22 NOTE — Progress Notes (Signed)
   New Patient Visit   Subjective  [name] is a 73 y.o. female NEW PATIENT who presents for the following: Mole of left cheek x 10 years or more. It has grown over the years. It has no symptoms.    The following portions of the chart were reviewed this encounter and updated as appropriate: medications, allergies, medical history  Review of Systems:  No other skin or systemic complaints except as noted in HPI or Assessment and Plan.  Objective  Well appearing patient in no apparent distress; mood and affect are within normal limits.   A focused examination was performed of the following areas: FACE, NECK    Relevant exam findings are noted in the Assessment and Plan.    Assessment & Plan   SEBORRHEIC KERATOSIS / DERMATOSIS PAPULOSA NIGRACANS   - Stuck-on, waxy, tan-brown papules and/or plaques  - Benign-appearing - Discussed benign etiology and prognosis. - Observe - Call for any changes      DERMATOSIS PAPULOSA NIGRA   SEBORRHEIC KERATOSIS    No follow-ups on file.  I, Roseline Hutchinson, CMA, am acting as scribe for Irving Lubbers K, PA-C .   Documentation: I have reviewed the above documentation for accuracy and completeness, and I agree with the above.  Debar Plate K, PA-C

## 2024-05-25 ENCOUNTER — Other Ambulatory Visit (HOSPITAL_BASED_OUTPATIENT_CLINIC_OR_DEPARTMENT_OTHER): Payer: Self-pay

## 2024-05-25 ENCOUNTER — Other Ambulatory Visit: Payer: Self-pay | Admitting: Internal Medicine

## 2024-05-25 MED ORDER — GLIMEPIRIDE 2 MG PO TABS
2.0000 mg | ORAL_TABLET | Freq: Every day | ORAL | 3 refills | Status: DC
  Filled 2024-05-25: qty 30, 30d supply, fill #0
  Filled 2024-06-21: qty 30, 30d supply, fill #1

## 2024-06-03 ENCOUNTER — Other Ambulatory Visit: Payer: Self-pay | Admitting: Family Medicine

## 2024-06-04 ENCOUNTER — Other Ambulatory Visit (HOSPITAL_BASED_OUTPATIENT_CLINIC_OR_DEPARTMENT_OTHER): Payer: Self-pay

## 2024-06-04 ENCOUNTER — Other Ambulatory Visit: Payer: Self-pay

## 2024-06-04 MED ORDER — FREESTYLE LIBRE 3 SENSOR MISC
1.0000 | 3 refills | Status: AC
  Filled 2024-06-04: qty 2, 28d supply, fill #0
  Filled 2024-07-09: qty 2, 28d supply, fill #1

## 2024-06-22 ENCOUNTER — Other Ambulatory Visit (HOSPITAL_BASED_OUTPATIENT_CLINIC_OR_DEPARTMENT_OTHER): Payer: Self-pay

## 2024-06-27 ENCOUNTER — Other Ambulatory Visit (HOSPITAL_BASED_OUTPATIENT_CLINIC_OR_DEPARTMENT_OTHER): Payer: Self-pay

## 2024-06-27 MED ORDER — FLUZONE HIGH-DOSE 0.5 ML IM SUSY
0.5000 mL | PREFILLED_SYRINGE | Freq: Once | INTRAMUSCULAR | 0 refills | Status: AC
  Filled 2024-06-27: qty 0.5, 1d supply, fill #0

## 2024-07-18 ENCOUNTER — Encounter: Payer: Self-pay | Admitting: Internal Medicine

## 2024-07-18 ENCOUNTER — Other Ambulatory Visit (HOSPITAL_BASED_OUTPATIENT_CLINIC_OR_DEPARTMENT_OTHER): Payer: Self-pay

## 2024-07-18 ENCOUNTER — Ambulatory Visit: Admitting: Internal Medicine

## 2024-07-18 VITALS — BP 120/70 | Ht 62.0 in | Wt 177.0 lb

## 2024-07-18 DIAGNOSIS — E785 Hyperlipidemia, unspecified: Secondary | ICD-10-CM

## 2024-07-18 DIAGNOSIS — E1122 Type 2 diabetes mellitus with diabetic chronic kidney disease: Secondary | ICD-10-CM

## 2024-07-18 DIAGNOSIS — E782 Mixed hyperlipidemia: Secondary | ICD-10-CM

## 2024-07-18 LAB — POCT GLYCOSYLATED HEMOGLOBIN (HGB A1C): Hemoglobin A1C: 6.3 % — AB (ref 4.0–5.6)

## 2024-07-18 MED ORDER — ROSUVASTATIN CALCIUM 10 MG PO TABS
10.0000 mg | ORAL_TABLET | Freq: Every day | ORAL | 3 refills | Status: AC
  Filled 2024-07-18: qty 90, 90d supply, fill #0

## 2024-07-18 MED ORDER — TIRZEPATIDE 5 MG/0.5ML ~~LOC~~ SOAJ
5.0000 mg | SUBCUTANEOUS | 3 refills | Status: AC
  Filled 2024-07-18: qty 6, 84d supply, fill #0

## 2024-07-18 MED ORDER — EMPAGLIFLOZIN 25 MG PO TABS
25.0000 mg | ORAL_TABLET | Freq: Every day | ORAL | 3 refills | Status: AC
  Filled 2024-07-18: qty 90, 90d supply, fill #0

## 2024-07-18 MED ORDER — METFORMIN HCL ER 500 MG PO TB24
500.0000 mg | ORAL_TABLET | Freq: Two times a day (BID) | ORAL | 3 refills | Status: AC
  Filled 2024-07-18: qty 180, 90d supply, fill #0

## 2024-07-18 NOTE — Patient Instructions (Signed)
-   STOP Glimepiride   - Continue Jardiance  25  mg, 1 tablet every morning  - Continue  Metformin  500 mg ,1  tablet with breakfast and 1 tablet with dinner - Continue Mounjaro  5 mg weekly     HOW TO TREAT LOW BLOOD SUGARS (Blood sugar LESS THAN 70 MG/DL) Please follow the RULE OF 15 for the treatment of hypoglycemia treatment (when your (blood sugars are less than 70 mg/dL)   STEP 1: Take 15 grams of carbohydrates when your blood sugar is low, which includes:  3-4 GLUCOSE TABS  OR 3-4 OZ OF JUICE OR REGULAR SODA OR ONE TUBE OF GLUCOSE GEL    STEP 2: RECHECK blood sugar in 15 MINUTES STEP 3: If your blood sugar is still low at the 15 minute recheck --> then, go back to STEP 1 and treat AGAIN with another 15 grams of carbohydrates.

## 2024-07-18 NOTE — Progress Notes (Signed)
 "      Name: [name]  Age/ Sex: 74 y.o., female   MRN/ DOB: [id number], 02/04/1951     PCP: Domenica Harlene LABOR, MD   Reason for Endocrinology Evaluation: Type 2 Diabetes Mellitus  Initial Endocrine Consultative Visit: 02/22/2017    PATIENT IDENTIFIER: [name] is a 74 y.o. female with a past medical history of T2DM, HTN , Dyslipidemia, Hx of breast Ca ( S/P right lumpectomy) . The patient has followed with Endocrinology clinic since 02/16/2019 for consultative assistance with management of her diabetes.     DIABETIC HISTORY:  [name] was diagnosed with T2DM in 2001. She has been on metformin , Glimepiride  and Januvia . Invokana  added in 2018 but was cost prohibitive. Her hemoglobin A1c has ranged from 7.0% in 2016, peaking at 8.8% in  2021.    She used to see Dr.Gherghe but was lost to follow up by 2019 , until her return to our clinic 08/2019.    Started Rybelsus  08/2020 but this had to be stopped by the end of 2022 due to inability to qualify for pt assistance   Switched glipizide  to repaglinide  01/2022 Started Ozempic  07/2022  Stopped repaglinide  12/2022, with an A1c of 7.1%  Discontinued Ozempic  and started Jardiance  due to GI side effects to Ozempic  06/2023   Started glimepiride  in July, 2025 with an A1c of 8.1%  Started Jardiance  03/2024 with an A1c of 7.3%  SUBJECTIVE:   During the last visit (03/21/2024):  A1c  7.3 %     Today (07/18/2024): [name] is here for a follow up on diabetes management.  She checks her blood sugars multiple times daliy through [employer] , the patient has been noted with hypoglycemia on CGM download occurring during the day.  Patient confirms these readings are inaccurate with a regular glucose meter check up   Has noted occasional nausea, with occasional abdominal pain , which has been improving  No constipation or diarrhea  She has been noted with weight loss   HOME DIABETES REGIMEN:  Metformin  500 mg XR,  BID Glimepiride  2 mg, half a tablet before breakfast Jardiance  25 mg daily Mounjaro  5 mg weekly    Statin:  Crestor  ACE-I/ARB: No   CONTINUOUS GLUCOSE MONITORING RECORD INTERPRETATION                       Glycemic patterns summary: BGs are optimal throughout the day and night Hyperglycemic episodes N/A  Hypoglycemic episodes occurred mainly during the day  Overnight periods: Mostly optimal   DIABETIC COMPLICATIONS: Microvascular complications:   Denies: CKD, retinopathy , neuropathy Last Eye Exam: Completed 02/24/2024  Macrovascular complications:   Denies: CAD, CVA, PVD   HISTORY:  Past Medical History:  Past Medical History:  Diagnosis Date   Acute bronchitis 10/31/2015   Allergy    seasonal   Arthritis    knee-left   Cancer (HCC) 04/2018   right breast ca   Cataract    right eye   Diabetes mellitus 52   type 2   Diaphragmatic hernia without mention of obstruction or gangrene    Endometriosis    Esophageal reflux    Esophageal stricture    GERD (gastroesophageal reflux disease)    HOCM (hypertrophic obstructive cardiomyopathy) (HCC)    Basal septal hypertrophy 16 mm with increased ECV at 30% on cardiac MRI 06/2021 consistent with HOCM   Hyperlipidemia    Hyperplastic colon polyp    Hypertension 52  MI (myocardial infarction) (HCC)    per pt   Nonalcoholic fatty liver disease 04/28/2010   Qualifier: Diagnosis of  By: Mavis MD, Norleen BRAVO    Nonspecific abnormal results of liver function study    NSVT (nonsustained ventricular tachycardia) (HCC)    noted on event monitor 05/2021   Personal history of radiation therapy 08/2018   bilateral breasts   Pharyngitis 02/18/2012   Plantar fascial fibromatosis    Preventative health care 01/22/2013   Seasonal allergies 10/31/2015   Sleep apnea    has oral appliance but does not fit well due to missing teeth   Sore throat 04/03/2017   Tubular adenoma of colon    Unspecified sleep apnea     no c-pap   Past Surgical History:  Past Surgical History:  Procedure Laterality Date   ABDOMINAL HYSTERECTOMY  1982   partial   APPENDECTOMY     benigh cyst in lung  2006   right   BREAST BIOPSY Bilateral 04/03/2018   BREAST BIOPSY Bilateral 03/23/2018   BREAST CYST INCISION AND DRAINAGE     right breast   BREAST EXCISIONAL BIOPSY Right 2019   BREAST LUMPECTOMY Bilateral 04/2018   BREAST LUMPECTOMY WITH RADIOACTIVE SEED LOCALIZATION Bilateral 05/02/2018   Procedure: RIGHT BREAST SEED LUMPECTOMY X2 AND LEFT BREAST SEED GUIDED LUMPECTOMY;  Surgeon: Aron Shoulders, MD;  Location: [facility];  Service: General;  Laterality: Bilateral;   CATARACT EXTRACTION     right eye   HERNIA REPAIR     per pt, she is not aware of this surgery   RE-EXCISION OF BREAST LUMPECTOMY Right 05/25/2018   Procedure: RE-EXCISION OF RIGHT BREAST LUMPECTOMY;  Surgeon: Aron Shoulders, MD;  Location: [facility];  Service: General;  Laterality: Right;   seba     TUBAL LIGATION     Social History:  reports that she has never smoked. She has never used smokeless tobacco. She reports that she does not drink alcohol and does not use drugs. Family History:  Family History  Problem Relation Age of Onset   Breast cancer Mother    Hypertension Mother    Diabetes Mother        type 2   Other Mother        esophagus surgery   Heart disease Father    Hyperlipidemia Father    Hypertension Father    Heart attack Father        MI at age 16   Hypertension Sister    Diabetes Sister    Diabetes Brother        type 2   Hypertension Brother    Heart disease Maternal Grandmother    Heart attack Maternal Grandfather    Hypertension Maternal Grandfather    Stroke Paternal Grandmother    Alcohol abuse Maternal Aunt    Cancer Maternal Aunt    Colon cancer Neg Hx    Esophageal cancer Neg Hx    Liver disease Neg Hx      HOME MEDICATIONS: Allergies as of 07/18/2024       Reactions    Atorvastatin  Other (See Comments)   Myalgia, fatigue, memory loss   Pravastatin  Other (See Comments)   Myalgia, fatigue, memory loss   Amoxicillin Itching   REACTION: rash   Codeine Nausea And Vomiting        Medication List        Accurate as of July 18, 2024 11:31 AM. If you have any questions, ask  your nurse or doctor.          albuterol  108 (90 Base) MCG/ACT inhaler Commonly known as: VENTOLIN  HFA Inhale 2 puffs by mouth into the lungs every 6 (six) hours as needed for wheezing or shortness of breath.   Aspir-Low 81 MG tablet Generic drug: aspirin  EC TAKE 1 TABLET BY MOUTH DAILY   Azelastine  HCl 137 MCG/SPRAY Soln Place 1-2 sprays into both nostrils 2 (two) times daily as needed for rhinitis.   bisoprolol -hydrochlorothiazide  5-6.25 MG tablet Commonly known as: ZIAC  Take 1 tablet by mouth daily.   cetirizine  10 MG tablet Commonly known as: ZYRTEC  Take 1 tablet (10 mg total) by mouth daily as needed for allergies or rhinitis.   famotidine  20 MG tablet Commonly known as: PEPCID  Take 1 tablet (20 mg total) by mouth at bedtime.   fluticasone  50 MCG/ACT nasal spray Commonly known as: FLONASE  Place 2 sprays into both nostrils daily.   FreeStyle Libre 3 Reader [place] Use as directed to check blood glucose continuously.   FreeStyle Libre 3 Sensor Misc Inject 1 sensor into the skin every 14 (fourteen) days. Use to check blood glucose continuously.   glimepiride  2 MG tablet Commonly known as: AMARYL  Take 0.5 tablets (1 mg total) by mouth daily before breakfast.   glimepiride  2 MG tablet Commonly known as: AMARYL  Take 1 tablet (2 mg total) by mouth daily before breakfast.   Jardiance  25 MG Tabs tablet Generic drug: empagliflozin  Take 1 tablet (25 mg total) by mouth daily before breakfast.   Magnesium  Oxide 400 MG Caps Take 1 capsule (400 mg total) by mouth daily.   metFORMIN  500 MG 24 hr tablet Commonly known as: GLUCOPHAGE -XR Take 1 tablet (500 mg  total) by mouth 2 (two) times daily with a meal.   Mounjaro  5 MG/0.5ML Pen Generic drug: tirzepatide  Inject 5 mg into the skin once a week.   tirzepatide  2.5 MG/0.5ML Pen Commonly known as: MOUNJARO  Inject 2.5 mg into the skin once a week.   omeprazole  40 MG capsule Commonly known as: PRILOSEC Take 1 capsule (40 mg total) by mouth 2 (two) times daily.   OneTouch Verio test strip Generic drug: glucose blood Use as directed to test blood sugar 2 (two) times daily.   rosuvastatin  10 MG tablet Commonly known as: Crestor  Take 1 tablet (10 mg total) by mouth daily.         OBJECTIVE:   Vital Signs: BP 120/70   Ht 5' 2 (1.575 m)   Wt 177 lb (80.3 kg)   BMI 32.37 kg/m      Exam: General: Pt appears well and is in NAD  Lungs: Clear with good BS bilat  Heart: RRR   Abdomen: soft, nontender  Extremities: No pretibial edema.   Neuro: MS is good with appropriate affect, pt is alert and Ox3       DM foot exam 07/18/2024  The skin of the feet is intact without sores or ulcerations. The pedal pulses are 2+ on right and 2+ on left. The sensation is intact to a screening 5.07, 10 gram monofilament bilaterally  DATA REVIEWED:  Lab Results  Component Value Date   HGBA1C 7.3 (A) 03/21/2024   HGBA1C 8.1 (A) 12/20/2023   HGBA1C 7.4 (H) 06/28/2023    Latest Reference Range & Units 02/09/24 15:14  Sodium 135 - 145 mEq/L 139  Potassium 3.5 - 5.1 mEq/L 4.0  Chloride 96 - 112 mEq/L 100  CO2 19 - 32 mEq/L 24  Glucose 70 -  99 mg/dL 881 (H)  BUN 6 - 23 mg/dL 22  Creatinine 9.59 - 8.79 mg/dL 8.75 (H)  Calcium  8.4 - 10.5 mg/dL 9.7  Alkaline Phosphatase 39 - 117 U/L 78  Albumin 3.5 - 5.2 g/dL 4.2  AST 0 - 37 U/L 19  ALT 0 - 35 U/L 22  Total Protein 6.0 - 8.3 g/dL 7.1  Total Bilirubin 0.2 - 1.2 mg/dL 0.3  GFR >39.99 mL/min 43.81 (L)    Latest Reference Range & Units 02/09/24 15:14  Total CHOL/HDL Ratio  4  Cholesterol 0 - 200 mg/dL 840  HDL Cholesterol >60.99 mg/dL  55.99  LDL (calc) 0 - 99 mg/dL 56  MICROALB/CREAT RATIO 0.0 - 30.0 mg/g Unable to calculate  NonHDL  115.30  Triglycerides 0.0 - 149.0 mg/dL 702.9 (H)  VLDL 0.0 - 59.9 mg/dL 40.5 (H)  (H): Data is abnormally high    ASSESSMENT / PLAN / RECOMMENDATIONS:   1) Type 2 Diabetes Mellitus, Optimally controlled, With CKD III complications - Most recent A1c of 6.3 %. Goal A1c < 7.0 %.     - A1c is optimal -Intolerant to Ozempic  due to multiple GI side effects - Will decrease metformin  by 50% due to a GFR <45 - The patient has improving nausea and abdominal pain that is attributed to Mounjaro , we did discuss modifying her diet by reducing or avoiding daily and greasy food as this can cause GI side effects in the setting of Mounjaro  intake, no changes to the dose -Will discontinue glimepiride  to prevent true hypoglycemia  MEDICATIONS:  -Discontinue glimepiride  -Continue Jardiance  25 mg daily  - Continue metformin  500 mg XR, twice daily - Continue Mounjaro  5 mg weekly   EDUCATION / INSTRUCTIONS: BG monitoring instructions: Patient is instructed to check her blood sugars 2 times a day, fasting and supper time. Call [facility] if: BG persistently < 70  I reviewed the Rule of 15 for the treatment of hypoglycemia in detail with the patient. Literature supplied.    2) Diabetic complications:  Eye: Does not have known diabetic retinopathy.  Neuro/ Feet: Does not have known diabetic peripheral neuropathy .  Renal: Patient does not have known baseline CKD. She   is not on an ACEI/ARB at present.  MA/CR.normal   3) Dyslipidemia:   -LDL at goal with most recent lipid panel through PCPs office -Continue atorvastatin  10 mg daily    F/U in 6 months   Signed electronically by: Stefano Redgie Butts, MD  [facility] Endocrinology  [facility] [address] [place], [place] [address] Phone: [phone] FAX: [phone]   CC: Domenica Harlene LABOR,  MD [address], [address] [place] [address] Phone: [phone]  Fax: [phone]  Return to Endocrinology clinic as below: Future Appointments  Date Time Provider Department Center  07/18/2024 11:50 AM Joely Losier, Donell Redgie, MD [facility] None  08/13/2024  9:00 AM Domenica Harlene LABOR, MD [facility] [address] [facility]  11/01/2024 11:30 AM [facility] [place] [facility] VISIT 2 [facility] [address]    "

## 2024-08-13 ENCOUNTER — Ambulatory Visit: Admitting: Family Medicine

## 2024-11-01 ENCOUNTER — Ambulatory Visit

## 2025-01-14 ENCOUNTER — Ambulatory Visit: Admitting: Internal Medicine
